# Patient Record
Sex: Male | Born: 1940 | Race: White | Hispanic: No | Marital: Married | Smoking: Former smoker
Health system: Southern US, Community
[De-identification: ages and names within clinical notes are randomized; demographics above are authoritative.]

## PROBLEM LIST (undated history)

## (undated) DIAGNOSIS — G8929 Other chronic pain: Secondary | ICD-10-CM

## (undated) DIAGNOSIS — R001 Bradycardia, unspecified: Secondary | ICD-10-CM

## (undated) DIAGNOSIS — M1990 Unspecified osteoarthritis, unspecified site: Secondary | ICD-10-CM

## (undated) DIAGNOSIS — I2510 Atherosclerotic heart disease of native coronary artery without angina pectoris: Secondary | ICD-10-CM

## (undated) DIAGNOSIS — I10 Essential (primary) hypertension: Secondary | ICD-10-CM

## (undated) DIAGNOSIS — C679 Malignant neoplasm of bladder, unspecified: Secondary | ICD-10-CM

## (undated) DIAGNOSIS — Z87442 Personal history of urinary calculi: Secondary | ICD-10-CM

## (undated) DIAGNOSIS — Z720 Tobacco use: Secondary | ICD-10-CM

## (undated) DIAGNOSIS — E785 Hyperlipidemia, unspecified: Secondary | ICD-10-CM

## (undated) DIAGNOSIS — R8290 Unspecified abnormal findings in urine: Secondary | ICD-10-CM

## (undated) DIAGNOSIS — F419 Anxiety disorder, unspecified: Secondary | ICD-10-CM

## (undated) DIAGNOSIS — F329 Major depressive disorder, single episode, unspecified: Secondary | ICD-10-CM

## (undated) DIAGNOSIS — M545 Low back pain, unspecified: Secondary | ICD-10-CM

## (undated) DIAGNOSIS — F32A Depression, unspecified: Secondary | ICD-10-CM

## (undated) DIAGNOSIS — I779 Disorder of arteries and arterioles, unspecified: Secondary | ICD-10-CM

## (undated) DIAGNOSIS — I639 Cerebral infarction, unspecified: Secondary | ICD-10-CM

## (undated) DIAGNOSIS — I480 Paroxysmal atrial fibrillation: Secondary | ICD-10-CM

## (undated) DIAGNOSIS — C329 Malignant neoplasm of larynx, unspecified: Secondary | ICD-10-CM

## (undated) DIAGNOSIS — I739 Peripheral vascular disease, unspecified: Secondary | ICD-10-CM

## (undated) DIAGNOSIS — N183 Chronic kidney disease, stage 3 unspecified: Secondary | ICD-10-CM

## (undated) DIAGNOSIS — I4510 Unspecified right bundle-branch block: Secondary | ICD-10-CM

## (undated) DIAGNOSIS — J398 Other specified diseases of upper respiratory tract: Secondary | ICD-10-CM

## (undated) DIAGNOSIS — Z930 Tracheostomy status: Secondary | ICD-10-CM

## (undated) DIAGNOSIS — K219 Gastro-esophageal reflux disease without esophagitis: Secondary | ICD-10-CM

## (undated) DIAGNOSIS — G459 Transient cerebral ischemic attack, unspecified: Secondary | ICD-10-CM

## (undated) DIAGNOSIS — E781 Pure hyperglyceridemia: Secondary | ICD-10-CM

## (undated) HISTORY — PX: CATARACT EXTRACTION, BILATERAL: SHX1313

## (undated) HISTORY — DX: Atherosclerotic heart disease of native coronary artery without angina pectoris: I25.10

## (undated) HISTORY — PX: CORONARY ARTERY BYPASS GRAFT: SHX141

## (undated) HISTORY — DX: Bradycardia, unspecified: R00.1

## (undated) HISTORY — DX: Gastro-esophageal reflux disease without esophagitis: K21.9

## (undated) HISTORY — DX: Tobacco use: Z72.0

## (undated) HISTORY — PX: CAROTID STENT: SHX1301

## (undated) HISTORY — DX: Malignant neoplasm of larynx, unspecified: C32.9

## (undated) HISTORY — DX: Disorder of arteries and arterioles, unspecified: I77.9

## (undated) HISTORY — DX: Peripheral vascular disease, unspecified: I73.9

## (undated) HISTORY — DX: Anxiety disorder, unspecified: F41.9

## (undated) HISTORY — DX: Essential (primary) hypertension: I10

## (undated) HISTORY — DX: Transient cerebral ischemic attack, unspecified: G45.9

## (undated) HISTORY — DX: Chronic kidney disease, stage 3 unspecified: N18.30

## (undated) HISTORY — DX: Cerebral infarction, unspecified: I63.9

## (undated) HISTORY — DX: Paroxysmal atrial fibrillation: I48.0

## (undated) HISTORY — PX: EYE SURGERY: SHX253

## (undated) HISTORY — PX: CORNEAL TRANSPLANT: SHX108

## (undated) HISTORY — DX: Unspecified right bundle-branch block: I45.10

---

## 1996-07-26 DIAGNOSIS — C329 Malignant neoplasm of larynx, unspecified: Secondary | ICD-10-CM

## 1996-07-26 HISTORY — PX: PARTIAL LARYNGECTOMY: SHX2175

## 1996-07-26 HISTORY — DX: Malignant neoplasm of larynx, unspecified: C32.9

## 2001-01-30 ENCOUNTER — Encounter: Payer: Self-pay | Admitting: Internal Medicine

## 2001-01-30 ENCOUNTER — Ambulatory Visit (HOSPITAL_COMMUNITY): Admission: RE | Admit: 2001-01-30 | Discharge: 2001-01-30 | Payer: Self-pay | Admitting: Internal Medicine

## 2001-02-14 ENCOUNTER — Ambulatory Visit (HOSPITAL_COMMUNITY): Admission: RE | Admit: 2001-02-14 | Discharge: 2001-02-14 | Payer: Self-pay | Admitting: General Surgery

## 2001-05-09 ENCOUNTER — Encounter: Payer: Self-pay | Admitting: Otolaryngology

## 2001-05-09 ENCOUNTER — Encounter: Admission: RE | Admit: 2001-05-09 | Discharge: 2001-05-09 | Payer: Self-pay | Admitting: Otolaryngology

## 2001-12-20 ENCOUNTER — Encounter: Payer: Self-pay | Admitting: Cardiology

## 2001-12-20 ENCOUNTER — Inpatient Hospital Stay (HOSPITAL_COMMUNITY): Admission: AD | Admit: 2001-12-20 | Discharge: 2001-12-27 | Payer: Self-pay | Admitting: Cardiology

## 2001-12-20 ENCOUNTER — Encounter: Payer: Self-pay | Admitting: Internal Medicine

## 2001-12-21 ENCOUNTER — Encounter: Payer: Self-pay | Admitting: Thoracic Surgery (Cardiothoracic Vascular Surgery)

## 2001-12-22 ENCOUNTER — Encounter: Payer: Self-pay | Admitting: Thoracic Surgery (Cardiothoracic Vascular Surgery)

## 2001-12-23 ENCOUNTER — Encounter: Payer: Self-pay | Admitting: Thoracic Surgery (Cardiothoracic Vascular Surgery)

## 2001-12-25 ENCOUNTER — Encounter: Payer: Self-pay | Admitting: Thoracic Surgery (Cardiothoracic Vascular Surgery)

## 2002-01-04 ENCOUNTER — Encounter: Payer: Self-pay | Admitting: *Deleted

## 2002-01-04 ENCOUNTER — Encounter: Payer: Self-pay | Admitting: Internal Medicine

## 2002-01-04 ENCOUNTER — Observation Stay (HOSPITAL_COMMUNITY): Admission: EM | Admit: 2002-01-04 | Discharge: 2002-01-05 | Payer: Self-pay | Admitting: *Deleted

## 2002-01-17 ENCOUNTER — Encounter
Admission: RE | Admit: 2002-01-17 | Discharge: 2002-01-17 | Payer: Self-pay | Admitting: Thoracic Surgery (Cardiothoracic Vascular Surgery)

## 2002-01-17 ENCOUNTER — Encounter: Payer: Self-pay | Admitting: Thoracic Surgery (Cardiothoracic Vascular Surgery)

## 2002-02-06 ENCOUNTER — Encounter (HOSPITAL_COMMUNITY): Admission: RE | Admit: 2002-02-06 | Discharge: 2002-03-08 | Payer: Self-pay | Admitting: Cardiology

## 2002-03-08 ENCOUNTER — Encounter (HOSPITAL_COMMUNITY): Admission: RE | Admit: 2002-03-08 | Discharge: 2002-04-07 | Payer: Self-pay | Admitting: Cardiology

## 2002-04-09 ENCOUNTER — Encounter (HOSPITAL_COMMUNITY): Admission: RE | Admit: 2002-04-09 | Discharge: 2002-05-09 | Payer: Self-pay | Admitting: Cardiology

## 2002-04-11 ENCOUNTER — Ambulatory Visit (HOSPITAL_COMMUNITY): Admission: RE | Admit: 2002-04-11 | Discharge: 2002-04-11 | Payer: Self-pay | Admitting: Otolaryngology

## 2002-04-11 ENCOUNTER — Encounter: Payer: Self-pay | Admitting: Otolaryngology

## 2002-05-25 ENCOUNTER — Ambulatory Visit (HOSPITAL_COMMUNITY): Admission: RE | Admit: 2002-05-25 | Discharge: 2002-05-25 | Payer: Self-pay | Admitting: Internal Medicine

## 2002-05-25 ENCOUNTER — Encounter: Payer: Self-pay | Admitting: Internal Medicine

## 2002-12-28 ENCOUNTER — Encounter (HOSPITAL_COMMUNITY): Admission: RE | Admit: 2002-12-28 | Discharge: 2003-01-27 | Payer: Self-pay | Admitting: *Deleted

## 2002-12-28 ENCOUNTER — Encounter: Payer: Self-pay | Admitting: *Deleted

## 2004-11-05 ENCOUNTER — Ambulatory Visit (HOSPITAL_COMMUNITY): Admission: RE | Admit: 2004-11-05 | Discharge: 2004-11-05 | Payer: Self-pay | Admitting: Internal Medicine

## 2005-04-29 ENCOUNTER — Ambulatory Visit (HOSPITAL_COMMUNITY): Admission: RE | Admit: 2005-04-29 | Discharge: 2005-04-29 | Payer: Self-pay | Admitting: Internal Medicine

## 2005-05-04 ENCOUNTER — Ambulatory Visit (HOSPITAL_COMMUNITY): Admission: RE | Admit: 2005-05-04 | Discharge: 2005-05-04 | Payer: Self-pay | Admitting: Internal Medicine

## 2005-05-13 ENCOUNTER — Encounter (INDEPENDENT_AMBULATORY_CARE_PROVIDER_SITE_OTHER): Payer: Self-pay | Admitting: *Deleted

## 2005-05-13 ENCOUNTER — Ambulatory Visit (HOSPITAL_COMMUNITY): Admission: RE | Admit: 2005-05-13 | Discharge: 2005-05-13 | Payer: Self-pay | Admitting: Internal Medicine

## 2005-05-27 ENCOUNTER — Ambulatory Visit (HOSPITAL_COMMUNITY): Admission: RE | Admit: 2005-05-27 | Discharge: 2005-05-27 | Payer: Self-pay | Admitting: Internal Medicine

## 2005-06-15 ENCOUNTER — Ambulatory Visit: Payer: Self-pay | Admitting: Internal Medicine

## 2005-06-18 ENCOUNTER — Ambulatory Visit: Payer: Self-pay | Admitting: Cardiology

## 2005-06-25 ENCOUNTER — Ambulatory Visit (HOSPITAL_COMMUNITY): Admission: RE | Admit: 2005-06-25 | Discharge: 2005-06-25 | Payer: Self-pay | Admitting: Otolaryngology

## 2005-07-04 ENCOUNTER — Emergency Department (HOSPITAL_COMMUNITY): Admission: EM | Admit: 2005-07-04 | Discharge: 2005-07-04 | Payer: Self-pay | Admitting: Emergency Medicine

## 2005-07-16 ENCOUNTER — Ambulatory Visit (HOSPITAL_COMMUNITY): Admission: RE | Admit: 2005-07-16 | Discharge: 2005-07-16 | Payer: Self-pay | Admitting: Otolaryngology

## 2005-07-16 ENCOUNTER — Encounter (INDEPENDENT_AMBULATORY_CARE_PROVIDER_SITE_OTHER): Payer: Self-pay | Admitting: *Deleted

## 2005-07-26 HISTORY — PX: LAPAROSCOPIC CHOLECYSTECTOMY: SUR755

## 2005-08-17 ENCOUNTER — Ambulatory Visit (HOSPITAL_COMMUNITY): Admission: RE | Admit: 2005-08-17 | Discharge: 2005-08-17 | Payer: Self-pay | Admitting: Internal Medicine

## 2006-01-17 ENCOUNTER — Ambulatory Visit (HOSPITAL_COMMUNITY): Admission: RE | Admit: 2006-01-17 | Discharge: 2006-01-17 | Payer: Self-pay | Admitting: Internal Medicine

## 2006-01-31 ENCOUNTER — Emergency Department (HOSPITAL_COMMUNITY): Admission: EM | Admit: 2006-01-31 | Discharge: 2006-01-31 | Payer: Self-pay | Admitting: Emergency Medicine

## 2006-01-31 ENCOUNTER — Encounter (INDEPENDENT_AMBULATORY_CARE_PROVIDER_SITE_OTHER): Payer: Self-pay | Admitting: *Deleted

## 2006-01-31 ENCOUNTER — Ambulatory Visit (HOSPITAL_COMMUNITY): Admission: RE | Admit: 2006-01-31 | Discharge: 2006-01-31 | Payer: Self-pay | Admitting: General Surgery

## 2006-05-24 ENCOUNTER — Ambulatory Visit: Payer: Self-pay | Admitting: Cardiology

## 2006-05-24 ENCOUNTER — Inpatient Hospital Stay (HOSPITAL_COMMUNITY): Admission: EM | Admit: 2006-05-24 | Discharge: 2006-05-27 | Payer: Self-pay | Admitting: Emergency Medicine

## 2006-05-25 ENCOUNTER — Ambulatory Visit: Payer: Self-pay | Admitting: Internal Medicine

## 2006-05-26 ENCOUNTER — Encounter: Payer: Self-pay | Admitting: Cardiology

## 2006-05-30 ENCOUNTER — Ambulatory Visit (HOSPITAL_COMMUNITY): Admission: RE | Admit: 2006-05-30 | Discharge: 2006-05-30 | Payer: Self-pay | Admitting: Cardiovascular Disease

## 2006-06-07 ENCOUNTER — Ambulatory Visit (HOSPITAL_COMMUNITY): Admission: RE | Admit: 2006-06-07 | Discharge: 2006-06-07 | Payer: Self-pay | Admitting: Internal Medicine

## 2006-06-09 ENCOUNTER — Ambulatory Visit: Payer: Self-pay | Admitting: Cardiology

## 2006-06-30 ENCOUNTER — Ambulatory Visit (HOSPITAL_COMMUNITY): Admission: RE | Admit: 2006-06-30 | Discharge: 2006-06-30 | Payer: Self-pay | Admitting: General Surgery

## 2006-07-04 ENCOUNTER — Ambulatory Visit (HOSPITAL_COMMUNITY): Admission: RE | Admit: 2006-07-04 | Discharge: 2006-07-04 | Payer: Self-pay | Admitting: Internal Medicine

## 2006-08-04 ENCOUNTER — Ambulatory Visit: Payer: Self-pay | Admitting: Gastroenterology

## 2006-12-08 ENCOUNTER — Ambulatory Visit (HOSPITAL_COMMUNITY): Admission: RE | Admit: 2006-12-08 | Discharge: 2006-12-08 | Payer: Self-pay | Admitting: Internal Medicine

## 2006-12-09 ENCOUNTER — Ambulatory Visit (HOSPITAL_COMMUNITY): Admission: RE | Admit: 2006-12-09 | Discharge: 2006-12-09 | Payer: Self-pay | Admitting: Family Medicine

## 2006-12-09 ENCOUNTER — Ambulatory Visit: Payer: Self-pay | Admitting: Cardiology

## 2006-12-15 ENCOUNTER — Ambulatory Visit (HOSPITAL_COMMUNITY): Admission: RE | Admit: 2006-12-15 | Discharge: 2006-12-15 | Payer: Self-pay | Admitting: Internal Medicine

## 2007-11-22 ENCOUNTER — Ambulatory Visit (HOSPITAL_COMMUNITY): Admission: RE | Admit: 2007-11-22 | Discharge: 2007-11-22 | Payer: Self-pay | Admitting: Internal Medicine

## 2008-03-04 ENCOUNTER — Inpatient Hospital Stay (HOSPITAL_COMMUNITY): Admission: AD | Admit: 2008-03-04 | Discharge: 2008-03-05 | Payer: Self-pay | Admitting: Internal Medicine

## 2008-03-04 ENCOUNTER — Ambulatory Visit: Payer: Self-pay | Admitting: Cardiology

## 2008-04-09 ENCOUNTER — Ambulatory Visit (HOSPITAL_COMMUNITY): Admission: RE | Admit: 2008-04-09 | Discharge: 2008-04-09 | Payer: Self-pay | Admitting: Internal Medicine

## 2008-11-21 ENCOUNTER — Ambulatory Visit: Payer: Self-pay | Admitting: Cardiology

## 2008-11-25 ENCOUNTER — Ambulatory Visit (HOSPITAL_COMMUNITY): Admission: RE | Admit: 2008-11-25 | Discharge: 2008-11-25 | Payer: Self-pay | Admitting: Cardiology

## 2009-02-25 ENCOUNTER — Ambulatory Visit: Payer: Self-pay | Admitting: Ophthalmology

## 2009-03-03 ENCOUNTER — Ambulatory Visit: Payer: Self-pay | Admitting: Ophthalmology

## 2009-05-23 ENCOUNTER — Encounter (INDEPENDENT_AMBULATORY_CARE_PROVIDER_SITE_OTHER): Payer: Self-pay | Admitting: *Deleted

## 2009-05-23 LAB — CONVERTED CEMR LAB
ALT: 17 units/L
AST: 15 units/L
Albumin: 4.1 g/dL
Alkaline Phosphatase: 113 units/L
BUN: 19 mg/dL
Bacteria, UA: NONE SEEN
CO2: 24 meq/L
Calcium: 9.2 mg/dL
Chloride: 105 meq/L
Cholesterol: 172 mg/dL
Creatinine, Ser: 1.14 mg/dL
Glucose, Bld: 98 mg/dL
HCT: 47.2 %
HDL: 33 mg/dL
Hemoglobin, Urine: NEGATIVE
Hemoglobin: 15.5 g/dL
LDL Cholesterol: 82 mg/dL
MCV: 97.7 fL
Nitrite: NEGATIVE
Platelets: 189 10*3/uL
Potassium: 4.8 meq/L
Protein, ur: NEGATIVE mg/dL
RBC / HPF: NONE SEEN
Sodium: 142 meq/L
Specific Gravity, Urine: 1.019
Total Protein: 7.1 g/dL
Triglycerides: 286 mg/dL
Urine Glucose: NEGATIVE mg/dL
WBC number, urine, microscopy: NONE SEEN /hpf
WBC, UA: NONE SEEN cells/hpf
WBC: 7.5 10*3/uL
pH: 6

## 2009-06-05 ENCOUNTER — Encounter (INDEPENDENT_AMBULATORY_CARE_PROVIDER_SITE_OTHER): Payer: Self-pay | Admitting: *Deleted

## 2009-12-14 ENCOUNTER — Encounter: Payer: Self-pay | Admitting: Cardiology

## 2009-12-14 DIAGNOSIS — C329 Malignant neoplasm of larynx, unspecified: Secondary | ICD-10-CM | POA: Insufficient documentation

## 2009-12-14 DIAGNOSIS — I6350 Cerebral infarction due to unspecified occlusion or stenosis of unspecified cerebral artery: Secondary | ICD-10-CM | POA: Insufficient documentation

## 2009-12-14 DIAGNOSIS — K219 Gastro-esophageal reflux disease without esophagitis: Secondary | ICD-10-CM | POA: Insufficient documentation

## 2009-12-14 DIAGNOSIS — E785 Hyperlipidemia, unspecified: Secondary | ICD-10-CM | POA: Insufficient documentation

## 2009-12-14 DIAGNOSIS — E782 Mixed hyperlipidemia: Secondary | ICD-10-CM | POA: Insufficient documentation

## 2009-12-14 DIAGNOSIS — N200 Calculus of kidney: Secondary | ICD-10-CM | POA: Insufficient documentation

## 2009-12-15 ENCOUNTER — Encounter (INDEPENDENT_AMBULATORY_CARE_PROVIDER_SITE_OTHER): Payer: Self-pay | Admitting: *Deleted

## 2009-12-15 ENCOUNTER — Ambulatory Visit: Payer: Self-pay | Admitting: Cardiology

## 2010-02-10 ENCOUNTER — Encounter: Payer: Self-pay | Admitting: Cardiology

## 2010-02-10 LAB — CONVERTED CEMR LAB
ALT: 21 units/L (ref 0–53)
AST: 19 units/L (ref 0–37)
Albumin: 4.2 g/dL (ref 3.5–5.2)
Alkaline Phosphatase: 116 units/L (ref 39–117)
BUN: 14 mg/dL (ref 6–23)
Basophils Absolute: 0 10*3/uL (ref 0.0–0.1)
Basophils Relative: 0 % (ref 0–1)
CO2: 28 meq/L (ref 19–32)
Calcium: 9.3 mg/dL (ref 8.4–10.5)
Chloride: 104 meq/L (ref 96–112)
Cholesterol: 178 mg/dL (ref 0–200)
Creatinine, Ser: 1.29 mg/dL (ref 0.40–1.50)
Eosinophils Absolute: 0.2 10*3/uL (ref 0.0–0.7)
Eosinophils Relative: 2 % (ref 0–5)
Glucose, Bld: 96 mg/dL (ref 70–99)
HCT: 46.6 % (ref 39.0–52.0)
HDL: 31 mg/dL — ABNORMAL LOW (ref >39)
Hemoglobin: 15.5 g/dL (ref 13.0–17.0)
LDL Cholesterol: 78 mg/dL (ref 0–99)
Lymphocytes Relative: 29 % (ref 12–46)
Lymphs Abs: 2.1 10*3/uL (ref 0.7–4.0)
MCHC: 33.3 g/dL (ref 30.0–36.0)
MCV: 97.1 fL (ref 78.0–100.0)
Monocytes Absolute: 0.5 10*3/uL (ref 0.1–1.0)
Monocytes Relative: 7 % (ref 3–12)
Neutro Abs: 4.5 10*3/uL (ref 1.7–7.7)
Neutrophils Relative %: 62 % (ref 43–77)
Platelets: 182 10*3/uL (ref 150–400)
Potassium: 4.9 meq/L (ref 3.5–5.3)
RBC: 4.8 M/uL (ref 4.22–5.81)
RDW: 13.7 % (ref 11.5–15.5)
Sodium: 140 meq/L (ref 135–145)
Total Bilirubin: 0.6 mg/dL (ref 0.3–1.2)
Total CHOL/HDL Ratio: 5.7
Total Protein: 7.3 g/dL (ref 6.0–8.3)
Triglycerides: 344 mg/dL — ABNORMAL HIGH (ref <150)
VLDL: 69 mg/dL — ABNORMAL HIGH (ref 0–40)
WBC: 7.3 10*3/uL (ref 4.0–10.5)

## 2010-04-22 ENCOUNTER — Encounter (INDEPENDENT_AMBULATORY_CARE_PROVIDER_SITE_OTHER): Payer: Self-pay | Admitting: *Deleted

## 2010-06-01 ENCOUNTER — Encounter (INDEPENDENT_AMBULATORY_CARE_PROVIDER_SITE_OTHER): Payer: Self-pay

## 2010-06-01 LAB — CONVERTED CEMR LAB
ALT: 20 units/L
AST: 19 units/L
Albumin: 4.1 g/dL
Alkaline Phosphatase: 93 units/L
BUN: 15 mg/dL
Bacteria, UA: NONE SEEN
CO2: 28 meq/L
Calcium: 8.9 mg/dL
Casts: NONE SEEN /lpf
Chloride: 105 meq/L
Cholesterol: 185 mg/dL
Creatinine, Ser: 1.11 mg/dL
Glucose, Bld: 98 mg/dL
HCT: 45.7 %
HDL: 31 mg/dL
Hemoglobin, Urine: NEGATIVE
Hemoglobin: 15.1 g/dL
LDL Cholesterol: 101 mg/dL
MCV: 97 fL
Nitrite: NEGATIVE
Platelets: 201 10*3/uL
Potassium: 5.1 meq/L
Protein, ur: NEGATIVE mg/dL
Sodium: 140 meq/L
Specific Gravity, Urine: 1.018
TSH: 2.601 microintl units/mL
Total Protein: 6.8 g/dL
Triglycerides: 265 mg/dL
Urine Glucose: NEGATIVE mg/dL
WBC: 7.5 10*3/uL
pH: 6.5

## 2010-07-30 ENCOUNTER — Telehealth (INDEPENDENT_AMBULATORY_CARE_PROVIDER_SITE_OTHER): Payer: Self-pay | Admitting: *Deleted

## 2010-08-27 NOTE — Miscellaneous (Signed)
Summary: TSH, Lipids, CMP and Urinalysis

## 2010-08-27 NOTE — Letter (Signed)
Summary: [place] Results Engineer, agricultural at [facility]  [address], [place] [address]   Phone: [phone]  Fax: [phone]      April 22, 2010 MRN: [id number]   Kenneth [name] [address] [place], [place]  [address]   Dear [name],  Your test ordered by Selena Batten has been reviewed by your physician (or physician assistant) and was found to be normal or stable. Your physician (or physician assistant) felt no changes were needed at this time.  ____ Echocardiogram  ____ Cardiac Stress Test  __x__ Lab Work  ____ Peripheral vascular study of arms, legs or neck  ____ CT scan or X-ray  ____ Lung or Breathing test  ____ Other:  No change in medical treatment at this time, per Dr. Dietrich Pates.  Enclosed is a copy of your labwork for your records.  Thank you, [name] Allyne Gee RN    [place] Bing, MD, Lenise Arena.C.Gaylord Shih, MD, F.A.C.C Lewayne Bunting, MD, F.A.C.C Nona Dell, MD, F.A.C.C Charlton Haws, MD, Lenise Arena.C.C

## 2010-08-27 NOTE — Letter (Signed)
Summary: [place] Future Lab Work Engineer, agricultural at [facility]  [address], [place] [address]   Phone: [phone]  Fax: [phone]     Dec 15, 2009 MRN: [id number]   Kenneth [name] [address] [place], [place]  [address]      YOUR LAB WORK IS DUE   __________July 22, 2011_______________________________  Please go to [facility], located across the street from [facility] on the second floor.  Hours are Monday - Friday 7am until 7:30pm         Saturday 8am until 12noon    _x_  DO NOT EAT OR DRINK AFTER MIDNIGHT EVENING PRIOR TO LABWORK  __ YOUR LABWORK IS NOT FASTING --YOU MAY EAT PRIOR TO LABWORK

## 2010-08-27 NOTE — Assessment & Plan Note (Signed)
Summary: 1 yr f/u per checkout on 11/21/08/tg   Visit Type:  Follow-up Primary Provider:  Dr. [name]   History of Present Illness: [name] returns to the office as scheduled for continued assessment and treatment of coronary artery disease and cardiovascular risk factors.  This nice gentleman underwent CABG surgery in 11/2001 and has done well since from a cardiac standpoint.  He reports chronic dyspnea on exertion that has been present for years.  He rests for a few minutes and then can proceed with activity.  A stress nuclear study in 02/2008 revealed decent exercise tolerance with a workload of 10 Mets, no stress-induced EKG abnormalities, normal left ventricular function and normal myocardial perfusion.  Arterial Doppler  Procedure date:  11/25/2008  Findings:      Normal ABIs Normal segmental pressures and pulse volume recordings No evidence for arterial occlusive disease of either lower extremity.   Current Medications (verified): 1)  Paxil 10 Mg Tabs (Paroxetine Hcl) .... Take 1 Tab Daily 2)  Metoprolol Tartrate 50 Mg Tabs (Metoprolol Tartrate) .... Take 1/2 Tab Two Times A Day 3)  Pravastatin Sodium 20 Mg Tabs (Pravastatin Sodium) .... Take 1 Tab Daily 4)  Plavix 75 Mg Tabs (Clopidogrel Bisulfate) .... Take 1 Tab Daily 5)  Famotidine 40 Mg Tabs (Famotidine) .... Take 1 Tab Two Times A Day 6)  Ranitidine Hcl 300 Mg Caps (Ranitidine Hcl) .... Take 1 Tab Daily 7)  Niaspan 500 Mg Cr-Tabs (Niacin (Antihyperlipidemic)) .... Take One Tablet By Mouth Daily Titrate To 1500mg  Daily  Allergies (verified): 1)  ! Penicillin 2)  ! Ativan 3)  ! Sulfa 4)  ! Erythromycin 5)  ! * Intravenous Contrast  Past History:  PMH, FH, and Social History reviewed and updated.  Review of Systems       See history of present illness.  Vital Signs:  Patient profile:   70 year old male Height:      66 inches Weight:      183 pounds BMI:     29.64 O2 Sat:      94 % on Room  air Pulse rate:   50 / minute BP sitting:   113 / 66  (right arm)  Vitals Entered By: Dreama Saa, CNA (Dec 15, 2009 2:54 PM)  O2 Flow:  Room air  Physical Exam  General:    Proportionate weight and height;well developed; no acute distress; weight-183, 1 pound more than 4/10 HEENT-hoarse voice Neck-No JVD; no carotid bruits: Lungs-No tachypnea, no rales; no rhonchi; no wheezes: Cardiovascular-normal PMI; normal S1 and S2; S4 present Abdomen-BS normal; soft and non-tender without masses or organomegaly:  Musculoskeletal-No deformities, no cyanosis or clubbing: Neurologic-Normal cranial nerves; symmetric strength and tone:  Skin-Warm, no significant lesions: Extremities-palpable distal pulses; trace edema:     Impression & Recommendations:  Problem # 1:  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  Repeat coronary angiography in 2007 demonstrated patency of the RIMA to the circumflex and LIMA to the LAD with normal LV function.  Treatment will continue to focus on optimal management of risk factors.  Problem # 2:  HYPERLIPIDEMIA (ICD-272.4) Patient started Niaspan per my instructions after his last visit, but discontinued that medication before a lipid profile was obtained.  Will once again titrate from [phone] mg per day and then seek a lipid profile in 2 months.  Problem # 3:  HYPERTENSION (ICD-401.1) Blood pressure control is excellent; current medications  will be continued.  Problem # 4:  Hx of CEREBROVASCULAR ACCIDENT (ICD-434.91) Kenneth [name] has a number of indications for treatment with clopidogrel, but none of them are entirely compelling.  Consideration can be given to substitution by aspirin.  I will reassess this nice gentleman in one year.  Other Orders: Future Orders: T-Comprehensive Metabolic Panel ([id number]) ... 02/13/2010 T-Lipid Profile [id number]) ... 02/13/2010 T-CBC w/Diff  ([id number]) ... 02/13/2010  Patient Instructions: 1)  Your physician recommends that you schedule a follow-up appointment in: 1 year 2)  Your physician recommends that you return for lab work in:2 months 3)  Your physician has recommended you make the following change in your medication: niaspan 500mg  daily titrating to 1500mg  daily Prescriptions: NIASPAN 500 MG CR-TABS (NIACIN (ANTIHYPERLIPIDEMIC)) take one tablet by mouth daily titrate to 1500mg  daily  #90 x 3   Entered by:   Teressa Lower RN   Authorized by:   Kathlen Brunswick, MD, [facility]   Signed by:   Teressa Lower RN on 12/15/2009   Method used:   Electronically to        [id number] (retail)       [address]       [place], [place]  [address]       Ph: [phone]       Fax: [phone]   RxID:   [id number]

## 2010-08-27 NOTE — Progress Notes (Signed)
  All Cardaic faxed to [facility] attn Dr.Enarson @ [phone] [facility]  July 30, 2010 1:55 PM

## 2010-08-27 NOTE — Miscellaneous (Signed)
Summary: cmp,cbc.lipids,ua 05/23/2009  Clinical Lists Changes  Observations: Added new observation of BACTERIA URN: none seen (05/23/2009 16:30) Added new observation of RBCS MICRO U: none seen (05/23/2009 16:30) Added new observation of WBCS MICRO U: none seen (05/23/2009 16:30) Added new observation of WBC UR: none seen (05/23/2009 16:30) Added new observation of NITRITE URN: neg (05/23/2009 16:30) Added new observation of PROTEIN UR: neg (05/23/2009 16:30) Added new observation of BLOOD UR: neg (05/23/2009 16:30) Added new observation of GLUCOSE UA: neg (05/23/2009 16:30) Added new observation of PH URINE: 6.0  (05/23/2009 16:30) Added new observation of SPEC GR URIN: 1.019  (05/23/2009 16:30) Added new observation of CALCIUM: 9.2 mg/dL (16/04/9603 54:09) Added new observation of ALBUMIN: 4.1 g/dL (81/19/1478 29:56) Added new observation of PROTEIN, TOT: 7.1 g/dL (21/30/8657 84:69) Added new observation of SGPT (ALT): 17 units/L (05/23/2009 16:30) Added new observation of SGOT (AST): 15 units/L (05/23/2009 16:30) Added new observation of ALK PHOS: 113 units/L (05/23/2009 16:30) Added new observation of BILI DIRECT: total bili  0.4 mg/dL (62/95/2841 32:44) Added new observation of CREATININE: 1.14 mg/dL (07/28/7251 66:44) Added new observation of BUN: 19 mg/dL (03/47/4259 56:38) Added new observation of BG RANDOM: 98 mg/dL (75/64/3329 51:88) Added new observation of CO2 PLSM/SER: 24 meq/L (05/23/2009 16:30) Added new observation of CL SERUM: 105 meq/L (05/23/2009 16:30) Added new observation of K SERUM: 4.8 meq/L (05/23/2009 16:30) Added new observation of NA: 142 meq/L (05/23/2009 16:30) Added new observation of LDL: 82 mg/dL (41/66/0630 16:01) Added new observation of HDL: 33 mg/dL (09/32/3557 32:20) Added new observation of TRIGLYC TOT: 286 mg/dL (25/42/7062 37:62) Added new observation of CHOLESTEROL: 172 mg/dL (83/15/1761 60:73) Added new observation of PLATELETK/UL: 189 K/uL  (05/23/2009 16:30) Added new observation of MCV: 97.7 fL (05/23/2009 16:30) Added new observation of HCT: 47.2 % (05/23/2009 16:30) Added new observation of HGB: 15.5 g/dL (71/12/2692 85:46) Added new observation of WBC COUNT: 7.5 10*3/microliter (05/23/2009 16:30)

## 2010-12-08 NOTE — Procedures (Signed)
NAME:  Kenneth [name], Kenneth [name] NO.:  [id number]   MEDICAL RECORD NO.:  [id number]          PATIENT TYPE:  OUT   LOCATION:  RAD                           FACILITY:  [facility]   PHYSICIAN:  Gerrit Friends. Dietrich Pates, MD, FACCDATE OF BIRTH:  Dec 10, 1940   DATE OF PROCEDURE:  12/09/2006  DATE OF DISCHARGE:                                ECHOCARDIOGRAM   REFERRING PHYSICIAN:  Kingsley Callander. Ouida Sills, M.D.   DATA:  [name] with TIA.   1. Technically adequate echocardiographic study.  2. Normal left and right atrial size; right and jugular size at the      upper limit of normal with normal systolic function; no RVH.  3. Normal aortic valve.  4. Normal proximal ascending aorta except for mild calcification of      the wall and annulus.  5. Very mild mitral valve thickening with mild annular calcification.  6. Normal tricuspid valve; physiologic regurgitation; normal estimated      RV systolic pressure.  7. Normal proximal pulmonary artery; pulmonic valve not well seen, but      is grossly normal.  8. Normal left ventricular size with borderline hypertrophy; normal      regional and global function.   Per      Gerrit Friends. Dietrich Pates, MD, [facility]  Electronically Signed     RMR/MEDQ  D:  12/09/2006  T:  12/10/2006  Job:  [id number]

## 2010-12-08 NOTE — H&P (Signed)
NAME:  Kenneth [name], Kenneth [name] NO.:  [id number]   MEDICAL RECORD NO.:  [id number]          PATIENT TYPE:  INP   LOCATION:  A335                          FACILITY:  [facility]   PHYSICIAN:  Kingsley Callander. Ouida Sills, MD       DATE OF BIRTH:  1941/07/26   DATE OF ADMISSION:  03/04/2008  DATE OF DISCHARGE:  LH                              HISTORY & PHYSICAL   CHIEF COMPLAINT:  Chest pain.   HISTORY OF PRESENT ILLNESS:  This patient is a 70 year old white male  with a history of coronary artery disease who presented to my office  complaining of recurring episodes of substernal chest pain over the past  2 weeks.  He states the pains have lasted for 15 minutes to over an hour  and have radiated from his substernal area up into his jaw and teeth.  He has had nausea, but no vomiting.  He denies diaphoresis or increased  shortness of breath.  Pains have not been related to exertion, position,  or meals.  He has not used nitroglycerin.  He had a three-vessel bypass  in 2003.  He had a cardiac cath in 2007.  Medical therapy was advised at  that point.  He had a V/Q scan during that hospitalization in the fall  of 2007, which was normal.  He does not smoke.  He has treated  hyperlipidemia.  He does not have diabetes or hypertension.   PAST MEDICAL HISTORY:  1. Coronary artery disease, status post CABG.  2. Stroke, treated with aspirin and Plavix.  3. Hyperlipidemia.  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  5. Anxiety.  6. Gastroesophageal reflux disease.  7. Kidney stone.  8. Status post laparoscopic cholecystectomy.   MEDICATIONS:  1. Plavix 75 mg daily.  2. Aspirin 81 mg, he takes 2 per day.  3. Metoprolol 25 mg b.i.d.  4. Lovastatin 20 mg daily.  5. Lopid 600 mg b.i.d.  6. Paxil 10 mg daily.  7. Prilosec 20 mg daily.   ALLERGIES:  PENICILLIN and ATIVAN.   SOCIAL HISTORY:  He is a former smoker.  He does not smoke now.  He does  not drink  alcohol.   FAMILY HISTORY:  His father died of an MI at 17 and had hypertension.  His mother had Alzheimer's disease, a brother died of heart disease, and  a brother has had prostate cancer.   REVIEW OF SYSTEMS:  He has had a chronic dyspnea on exertion.  He has  not had increased cough, fever, change in bowel habits, and difficulty  voiding.  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  He had a  colonoscopy in 2002.  He had an EGD in 2007, which revealed no ulcer.  Chronic gastritis changes were present.   PHYSICAL EXAMINATION:  VITAL SIGNS:  Blood pressure 96/70, pulse 60,  weight 177.  GENERAL:  Alert and in no distress.  HEENT:  He has a chronic voice change.  No scleral icterus.  Pharynx is  dry.  NECK: No  JVD or thyromegaly.  LUNGS: Clear.  Well healed sternotomy scar.  HEART:  Regular with no murmurs.  ABDOMEN:  Nontender.  No hepatosplenomegaly.  EXTREMITIES:  No cyanosis, clubbing, or edema.  Pulses intact.  NEURO:  At baseline.  SKIN:  Warm and dry.   LABORATORY DATA:  His EKG reveals normal sinus rhythm at 62 beats per  minute with early transition, possible left atrial enlargement and  nonspecific anterolateral T-wave changes.   IMPRESSION:  1. Unstable angina.  He has had an episode of chest pain earlier this      morning.  He is being hospitalized for additional evaluation.  We      will obtain cardiac enzymes and a cardiology consultation.  He did      have an 80% stenosis of an obtuse marginal on his cardiac cath in      2007.  2. History of laryngeal cancer.  He has had no evidence of recurrence.  3. History of stroke (right parietal/right frontal, May 2008).  4. Gastroesophageal reflux disease.  5. Hyperlipidemia.  6. Anxiety.  7. Lung scarring.      Kingsley Callander. Ouida Sills, MD  Electronically Signed     ROF/MEDQ  D:  03/04/2008  T:  03/05/2008  Job:  [id number]

## 2010-12-08 NOTE — Letter (Signed)
November 21, 2008    Kingsley Callander. Ouida Sills, MD  [address]  [place], [place] [address]   RE:  Kenneth [name], Kenneth [name]  MRN:  [id number]  /  DOB:  1941-01-15   Dear Kenneth [name],   Kenneth [name] returns to the office today for continued assessment and  treatment of coronary artery disease.  I had evaluated him in hospital  approximately 8 months ago, but not in the office for the past 3 years.  He has done well.  She had negative stress test when he was admitted in  August 2009.  He has had no subsequent chest discomfort.  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  Recent lipid  profile was fairly good, but triglycerides remain high and HDL remains  low.  Blood pressure control has been good as far as he knows.   CURRENT MEDICATIONS:  Paxil 10 mg daily, metoprolol 25 mg b.i.d.,  lovastatin 40 mg daily, clopidogrel 75 mg daily, cimetidine 75 mg daily.  He previously took gemfibrozil, but developed a skin rash that was  thought secondary to that medication.   PHYSICAL EXAMINATION:  GENERAL:  Pleasant gentleman in no acute  distress.  VITAL SIGNS:  The weight is 182, two pounds less than in 2007.  Blood  pressure 100/65, heart rate 50 and regular, respirations 12 and  unlabored.  NECK:  No jugular venous distention; normal carotid upstrokes without  bruits.  LUNGS:  Clear.  CARDIAC:  Normal first and second heart sounds; fourth heart sound  present.  ABDOMEN:  Soft and nontender; no organomegaly.  EXTREMITIES:  Distal pulses 1+; wave forms are biphasic on the right,  but monophasic on the left; left dorsalis pedis is not obtainable.   IMPRESSION:  Kenneth [name] is doing well from a cardiac standpoint with  no progression of disease at catheterization in 2007 and no ischemia on  stress testing less than a year ago.  He has preserved left ventricular  systolic function.  Hypertension is well controlled.  Control of  dyslipidemia somewhat suboptimal.  Niaspan will be added to  his medical  regime and titrated to 1500 mg daily with a repeat lipid profile  thereafter.  He appears to have peripheral vascular disease and  complains that his legs give out easily when he exerts myself.  ABIs and  arterial ultrasound of the lower extremities will be obtained.  I will  plan to see this nice gentleman again in 1 year.    Sincerely,      Gerrit Friends. Dietrich Pates, MD, [facility]  Electronically Signed    RMR/MedQ  DD: 11/21/2008  DT: 11/22/2008  Job #: [id number]

## 2010-12-08 NOTE — Discharge Summary (Signed)
NAME:  Kenneth [name], Kenneth [name] NO.:  [id number]   MEDICAL RECORD NO.:  [id number]           PATIENT TYPE:  INP   LOCATION:  A335                          FACILITY:  [facility]   PHYSICIAN:  Kingsley Callander. Ouida Sills, MD       DATE OF BIRTH:  [birth year]   DATE OF ADMISSION:  03/04/2008  DATE OF DISCHARGE:  08/11/2009LH                               DISCHARGE SUMMARY   DISCHARGE DIAGNOSES:  1. Chest pain.  2. Urticaria.  3. Coronary artery disease.  4. History of stroke.  5. History of laryngeal cancer.  6. Gastroesophageal reflux disease.  7. Hyperlipidemia.  8. Anxiety.  9. History of lung scarring.   DISCHARGE MEDICATIONS:  1. Aspirin 81 mg daily.  2. Plavix 75 mg daily.  3. Metoprolol 25 mg b.i.d.  4. Lovastatin 20 mg q.h.s.  5. Omeprazole 20 mg daily.  6. Lopid 600 mg b.i.d.  7. Claritin 10 mg daily.  8. Sublingual nitroglycerin 0.4 mg every 5 minutes x3 p.r.n.   PROCEDURES:  Myoview stress test.   HOSPITAL COURSE:  This patient is a 70 year old white male with a  history of coronary artery disease and bypass surgery who presented with  recurring bouts of chest pain over the past 2 weeks.  His EKG showed no  definite ischemic changes.  His cardiac enzymes were normal.  He was in  normal sinus rhythm on telemetry.  He was seen in Cardiology  consultation by Dr. Dietrich Pates.  He underwent a Myoview stress test which  revealed no evidence of ischemia.  Recommendation from Cardiology was to  utilize sublingual nitroglycerin and return for outpatient followup.  He  will continue his current cardiovascular medications.   He had experienced recent episodes of urticaria and possibly angioedema.  He will be treated with Claritin and p.r.n. Atarax.  He will be seen in  followup in my office in 2 weeks.      Kingsley Callander. Ouida Sills, MD  Electronically Signed     ROF/MEDQ  D:  03/05/2008  T:  03/06/2008  Job:  [id number]

## 2010-12-08 NOTE — Consult Note (Signed)
NAME:  Kenneth [name], Kenneth [name] NO.:  [id number]   MEDICAL RECORD NO.:  [id number]          PATIENT TYPE:  INP   LOCATION:  A335                          FACILITY:  [facility]   PHYSICIAN:  Kenneth Friends. Dietrich Pates, MD, FACCDATE OF BIRTH:  May 04, 1941   DATE OF CONSULTATION:  03/04/2008  DATE OF DISCHARGE:  03/05/2008                                 CONSULTATION   REFERRING PHYSICIAN:  Kingsley Callander. Ouida Sills, MD   PRIMARY CARDIOLOGIST:  Previously Dr. Daleen Squibb.   HISTORY OF PRESENT ILLNESS:  [name] with a known  coronary artery disease admitted to hospital with recurrent chest  discomfort.  Kenneth [name] underwent CABG surgery in 2003.  In 2007, he  presented with similar symptoms and underwent repeat cardiac  catheterization demonstrating patent grafts.  He has done well since,  but developed episodic substernal chest pressure over the past few  weeks.  These are of moderate intensity and persist for a number of  minutes to up to an hour.  There is radiation to the left jaw and teeth.  He has associated nausea, but no emesis.  He has had no diaphoresis nor  dyspnea with these spells.  There is no clear relationship to exertion.  He has not tried nitroglycerin.  He was seen in his primary care  physician's office and admitted for further evaluation.   PAST MEDICAL HISTORY:  Otherwise notable for,  1. Hypertension.  2. Hyperlipidemia.  3. GERD.  4. Nephrolithiasis.  5. Vertigo.  6. He underwent uncomplicated cholecystectomy in 2007.  7. He had laryngeal carcinoma approximately 11 years that has not      recurred following resection.   ALLERGIES:  PENICILLIN, ERYTHROMYCIN, and SULFA DRUGS are reported.   MEDICATIONS:  1. Clopidogrel 75 mg daily.  2. Aspirin 81 mg b.i.d.  3. Metoprolol 25 mg b.i.d.  4. Lovastatin 20 mg daily.  5. Lopid 500 mg b.i.d.  6. Paxil 10 mg daily.  7. Prilosec 20 mg daily.   SOCIAL HISTORY:  Previous cigarette use with approximately 40  pack-year  consumption.  No excessive use of alcohol.   FAMILY HISTORY:  Father suffered a fatal myocardial infarction at  advanced age; mother had Alzheimer disease; brother had cardiac disease,  and there is a family history of hypertension.   REVIEW OF SYSTEMS:  Occasional reflux symptoms; chronically hoarse voice  following partial laryngectomy; visual impairment requiring corrective  lenses; hearing impairment on the left requiring a hearing aid; upper  and lower dentures; occasional depression; regular diet at home.  All  other systems reviewed and are negative.   PHYSICAL EXAMINATION:  GENERAL:  Pleasant gentleman in no acute  distress.  VITAL SIGNS:  Temperature is 98.1, heart rate 60 and regular, blood  pressure 125/65, and respirations 18.  HEENT:  Anicteric sclerae; normal lids and conjunctivae; hoarse voice;  normal oral mucosa; hearing aid on the left ear.  NECK:  No jugular venous distention; normal carotid upstrokes without  bruits.  ENDOCRINE:  No thyromegaly.  HEMATOPOIETIC:  No adenopathy.  LUNGS:  Clear; resonant to percussion.  CARDIAC:  Normal first  and second heart sounds; fourth heart sound  present.  ABDOMEN:  Soft and nontender; no organomegaly.  EXTREMITIES:  No edema; 1+ distal pulses.  NEUROLOGIC:  Symmetric strength and tone; normal cranial nerves.   EKG:  Normal sinus rhythm; within normal limits.   Initial cardiac markers are normal.   Chest x-ray:  Increased markings in the mid lung zones bilaterally that  appears unchanged from November 2007 and it has been previously  investigated with the CT scan that revealed scarring and atelectasis.  Chest x-ray is unremarkable.  CBC and chemistry profile are normal  except for a mildly elevated alkaline phosphatase.   IMPRESSION:  Kenneth [name] presents with impressive symptoms, but  cardiac catheterization has been negative in the past for progression of  disease with similar episodes.  We will plan to  proceed with a nuclear  stress test in the morning if testing continues to be benign and  symptoms do not recur.  If negative, empiric treatment with  nitroglycerin will likely be the approach to take in the immediate  future.  We greatly appreciate the request for consultation and will be  happy to continue to follow this nice gentleman with you.      Kenneth Friends. Dietrich Pates, MD, [facility]  Electronically Signed     RMR/MEDQ  D:  03/05/2008  T:  03/06/2008  Job:  [id number]

## 2010-12-10 ENCOUNTER — Other Ambulatory Visit (HOSPITAL_COMMUNITY): Payer: Self-pay | Admitting: Internal Medicine

## 2010-12-10 ENCOUNTER — Ambulatory Visit (HOSPITAL_COMMUNITY)
Admission: RE | Admit: 2010-12-10 | Discharge: 2010-12-10 | Disposition: A | Discharge status: Home / Self Care | Payer: MEDICARE | Source: Ambulatory Visit | Attending: Internal Medicine | Admitting: Internal Medicine

## 2010-12-10 DIAGNOSIS — R52 Pain, unspecified: Secondary | ICD-10-CM

## 2010-12-10 DIAGNOSIS — M25519 Pain in unspecified shoulder: Secondary | ICD-10-CM | POA: Insufficient documentation

## 2010-12-10 DIAGNOSIS — S46909A Unspecified injury of unspecified muscle, fascia and tendon at shoulder and upper arm level, unspecified arm, initial encounter: Secondary | ICD-10-CM | POA: Insufficient documentation

## 2010-12-10 DIAGNOSIS — W19XXXA Unspecified fall, initial encounter: Secondary | ICD-10-CM

## 2010-12-10 DIAGNOSIS — S4980XA Other specified injuries of shoulder and upper arm, unspecified arm, initial encounter: Secondary | ICD-10-CM | POA: Insufficient documentation

## 2010-12-10 DIAGNOSIS — X58XXXA Exposure to other specified factors, initial encounter: Secondary | ICD-10-CM | POA: Insufficient documentation

## 2010-12-11 NOTE — Op Note (Signed)
[place]  Patient:    Kenneth [name], Kenneth [name] Visit Number: [id number] MRN: [id number]          Service Type: MED Location: 2000 2030 01 Attending Physician:  Charlett Lango Dictated by:   Salvatore Decent. Dorris Fetch, M.D. Proc. Date: 12/21/01 Admit Date:  12/20/2001   CC:         Thomas C. Daleen Squibb, M.D. [facility]  [name], M.D.  Veneda Melter, M.D. [facility]   Operative Report  PREOPERATIVE DIAGNOSIS:  Left main disease.  POSTOPERATIVE DIAGNOSIS:  Left main disease.  PROCEDURES: 1. Median sternotomy. 2. Extracorporeal circulation. 3. Coronary artery bypass grafting x3 (sequential left internal mammary artery    to first diagonal and left anterior descending coronary artery, right    internal mammary artery to obtuse marginal 1 via transverse sinus).  SURGEON:  Salvatore Decent. Dorris Fetch, M.D.  ASSISTANT:  Mikey Bussing, M.D.  ANESTHESIA:  General.  FINDINGS:  Good-quality targets, good-quality conduits.  Preserved left ventricular function.  CLINICAL NOTE:  Kenneth [name] is a 70 year old gentleman who presented with class III progressive angina.  He had an exercise tolerance test which was early positive with significant ST depressions anterolaterally.  He underwent cardiac catheterization on Dec 21, 2001, which showed a tight distal left main stenosis with involvement of the ostia of the LAD and circumflex.  He had a bifid LAD system with a larger diagonal branch, which had a 50% stenosis, and there was also a 50% stenosis of the smaller LAD septal branch.  The patient was referred for coronary artery bypass graft and given the severe nature of his left main disease, he was advised to undergo surgery on an urgent basis, as there was operating time available.  The indications, risks, benefits, and alternative procedures were discussed in detail with the patient and his family.  He understood and accepted the risks and agreed to  proceed.  DESCRIPTION OF PROCEDURE:  Kenneth [name] was brought from the catheterization lab holding area to the operating room on Dec 21, 2001.  Lines were placed to monitor arterial, central venous, and pulmonary arterial pressure.  EKG leads were placed for continuous telemetry.  The patient was taken to the operating room, anesthetized, and intubated.  A Foley catheter was placed.  Intravenous antibiotics were administered.  The chest, abdomen, and legs were prepped and draped in the usual fashion.  A median sternotomy was performed, and the left internal mammary artery was harvested using standard technique.  Heparin 5000 units was given prior to dividing the distal end of the mammary artery.  There was good flow through the cut end of the vessel.  The mammary artery was placed in a papaverine-soaked sponge and placed into the left pleural space.  Next the right internal mammary artery was harvested, again using standard technique, with the larger branches being doubly clipped.  The remainder of the full heparin dose was given prior to dividing the distal end of the right mammary artery.  Again there was excellent flow through the distal end of the right mammary artery as well.  After confirming that the ACT was adequate, the pericardium was opened.  The ascending aorta was inspected and palpated.  There was no palpable atherosclerotic disease.  The aorta was cannulated via concentric 2-0 Ethibond pledgeted pursestring sutures.  A dual-stage venous cannula was placed via pursestring suture in the right atrial appendage.  Cardiopulmonary bypass was instituted, and the patient was cooled to 32 degrees Celsius.  The  coronary arteries were inspected and the anastomotic sites were chosen.  The conduits were inspected and cut to length.  The diagonal and LAD were set up well for sequential mammary grafting given that the diagonal was the dominant vessel for the anterior portion of the  heart.  The right mammary reached easily via the transverse sinus to the high anterolateral obtuse marginal I.  There was no significant disease in the right coronary.  A foam pad was placed in the pericardium to protect the left phrenic nerve and insulate the heart.  A temperature probe was placed in the myocardial septum.  A cardioplegia cannula was placed in the ascending aorta.  The aorta was crossclamped.  The left ventricle was emptied via the aortic root vent.  Cardiac arrest was then achieved with a combination of cold antegrade blood cardioplegia and topical iced saline.  After achieving a complete diastolic arrest and a myocardial septal temperature of 11 degrees Celsius, the following distal anastomoses were performed:  First an incision was made in the right side of the pericardium to allow passage of the right mammary artery.  It was brought through the transverse sinus, being careful to avoid twisting of the pedicle.  The distal end of the right mammary was spatulated, and it was anastomosed end-to-side to the first obtuse marginal, which was a 2 mm, good-quality target.  The right mammary was a 2 mm, good-quality conduit.  The anastomosis was performed with a running 8-0 Prolene suture.  At the completion of the mammary to OM anastomosis, the bulldog clamp was briefly removed.  A good flash of flow was seen distally in the vessel.  There was good hemostasis at the anastomosis.  The bulldog clamp was replaced and the mammary pedicle was tacked to the epicardial surface of the heart with 6-0 Prolene sutures.  Additional cardioplegia then was administered down the aortic root.  Next the left internal mammary artery was brought through a window in the left side of the pericardium.  A longitudinal arteriotomy was made in the LAD at the site where it crossed over the large anterior diagonal branch of the LAD.  This was a 2 mm good-quality target.  The right mammary was a 2  mm good-quality conduit.  A side-to-side anastomosis was performed with a running 8-0 Prolene suture.  At the completion of this anastomosis, the bulldog clamp again was briefly removed.  There was good hemostasis at the anastomosis.  The bulldog clamp was replaced.  The distal end of the mammary artery was cut to length and spatulated, and it was anastomosed end-to-side to the LAD, which was a 1 mm small but good-quality vessel.  This anastomosis also was performed with a running 8-0 Prolene suture.  Rewarming was begun.  When the mammary to LAD anastomosis was complete, the bulldog clamp was removed from the mammary artery.  There was good hemostasis at both anastomoses.  Septal rewarming was noted.  The bulldog clamp was also removed from the right mammary artery.  The aortic crossclamp was removed.  The total crossclamp time was 50 minutes.  The patient spontaneously resumed sinus rhythm and did not require defibrillation.  The distal anastomoses were inspected for hemostasis.  Epicardial pacing wires were placed on the right ventricle and right atrium.  The cardioplegia cannula was removed from the ascending aorta.  When the patient had been rewarmed to a core temperature of 37 degrees Celsius, he was weaned from cardiopulmonary bypass.  He was in sinus rhythm  and on no inotropic support at the time of separation from bypass.  Total bypass time was 84 minutes.  The initial cardiac index was 2 L/min. per sq. m.  The patient remained hemodynamically stable throughout the postbypass period.  A test dose of protamine was administered.  It was well-tolerated.  The atrial and aortic cannulae were removed.  The remainder of the protamine was administered without incident.  The chest was irrigated with 1 L of warm normal saline containing 1 g of vancomycin.  The pericardium was reapproximated over the heart with interrupted 3-0 silk sutures.  The mediastinal fat was reapproximated to cover  the ascending aorta.  Bilateral pleural and two mediastinal chest tubes were placed through separate subcostal incisions.  The sternum was closed with heavy-gauge interrupted stainless steel wires, the pectoralis fascia was closed with a running #1 Vicryl suture, subcutaneous tissue was closed with running 2-0 Vicryl suture, and skin was closed with 3-0 Vicryl subcuticular suture.  All sponge, needle, and instrument counts were correct at the end of the procedure.  There were no intraoperative complications.  The patient was taken from the operating room to the surgical intensive care unit in critical but stable condition. Dictated by:   Salvatore Decent Dorris Fetch, M.D. Attending Physician:  Charlett Lango DD:  12/21/01 TD:  12/23/01 Job: [id number]

## 2010-12-11 NOTE — Procedures (Signed)
NAME:  Kenneth [name], Kenneth [name] NO.:  [id number]   MEDICAL RECORD NO.:  [id number]          PATIENT TYPE:  OUT   LOCATION:  RESP                          FACILITY:  [facility]   PHYSICIAN:  Edward L. Juanetta Gosling, M.D.DATE OF BIRTH:  Jan 11, 1941   DATE OF PROCEDURE:  DATE OF DISCHARGE:                              PULMONARY FUNCTION TEST   1.  Spirometry shows a mild ventilatory defect with evidence of airflow      obstruction.  2.  Lung volumes show very mild reduction in total lung capacity which may      indicate a separate restrictive change.  3.  DLCO is normal.  4.  Arterial blood gases are normal.  5.  There is no significant bronchodilator effect.   IMPRESSION:  This PFT is consistent with a diagnosis of chronic obstructive  pulmonary disease or asthma.      Edward L. Juanetta Gosling, M.D.  Electronically Signed     ELH/MEDQ  D:  05/28/2005  T:  05/28/2005  Job:  [id number]

## 2010-12-11 NOTE — Discharge Summary (Signed)
NAME:  Kenneth [name], KOHAN NO.:  [id number]   MEDICAL RECORD NO.:  [id number]          PATIENT TYPE:  INP   LOCATION:  3705                         FACILITY:  [facility]   PHYSICIAN:  Veverly Fells. Excell Seltzer, MD  DATE OF BIRTH:  1941-03-29   DATE OF ADMISSION:  05/25/2006  DATE OF DISCHARGE:  05/27/2006                                 DISCHARGE SUMMARY   PROCEDURES:  1. Cardiac catheterization.  2. Coronary arteriogram.  3. Left ventriculogram.  4. LIMA arteriogram.  5. RIMA arteriogram.  6. VQ scan.   PRIMARY DIAGNOSIS:  Chest pain, medical therapy for distal coronary artery  disease.   SECONDARY DIAGNOSES:  1. Hypertension.  2. Hyperlipidemia.  3. History of laryngeal carcinoma with surgery and radiation.  4. Status post a cholecystectomy.  5. Gastroesophageal reflux disease.  6. Nephrolithiasis.  7. History of inner ear problems.  8. ALLERGY OR INTOLERANCE TO PENICILLIN, ERYTHROMYCIN AND SULFA DRUGS.  9. Family history of coronary artery disease in his father and siblings.  10.Acute renal insufficiency post cath.  11.Stable bilateral nodular densities in the lungs, unchanged from the      previous year and likely scarring, but repeat chest x-ray in 6 months      to a year is recommended.   TIME AT DISCHARGE:  Thirty-eight minutes.   HOSPITAL COURSE:  Kenneth [name] is a 70 year old male with known coronary  artery disease.  He was evaluated at [facility] for chest pain by Dr.  Dietrich Pates.  Dr. Dietrich Pates felt that further evaluation and possible cardiac  catheterization were indicated and he was transferred to [facility]  for this.   His cardiac catheterization showed a totaled LAD and a 90% left main.  There  was an 80% stenosis to the OM.  The LIMA to the LAD was patent, but there  was a small kink where it was tethered to the chest wall, but this did not  appear to be flow limiting.  The RIMA to the OM was patent.  Dr. [name]  evaluated the films and  felt that there was a possible area of ischemia in  the small OM2 and the small diagonal.  A VQ scan was recommended.   The VQ scan was normal.  A lipid profile was performed, which showed a total  cholesterol of 147, triglycerides 159, HDL 30, LDL 85.  A TSH has been drawn  and is pending at the time of dictation.   The day after the heart catheterization, his creatinine increased to 2.2.  He was held overnight and hydrated.  The next day his BUN was 15 with a  creatinine 1.3.  He is not on any diuretic therapy and he is encouraged to  continue oral hydration.   Mr. Hommes pain has resolved.  He is to continue on his previous  therapies for hyperlipidemia.  His blood pressure was slightly elevated  here, occasionally going up into the 140s, but he states that he has well  controlled blood pressure at home.  Pending evaluation by Dr. Excell Seltzer, he is  tentatively stable for discharge on  May 27, 2006 with outpatient  followup arranged.   DISCHARGE INSTRUCTIONS:  1. He is to stick to a low-fat diet.  2. He is to call our office for problems with the cath site.  3. He is to follow up with Dr. Dietrich Pates on November 15 at 11:30 and with      Dr. Ouida Sills as needed.   DISCHARGE MEDICATIONS:  1. Coated aspirin 81 mg a day.  2. Metoprolol 25 mg one-half tab b.i.d.  3. Protonix 40 mg a day.  4. Lovastatin 40 mg a day.  5. TriCor 145 mg a day.  6. Nitroglycerin sublingual p.r.n.      Theodore Demark, PA-C      Veverly Fells. Excell Seltzer, MD  Electronically Signed    RB/MEDQ  D:  05/27/2006  T:  05/28/2006  Job:  [id number]   cc:   Kingsley Callander. Ouida Sills, MD

## 2010-12-11 NOTE — H&P (Signed)
[place]. [facility]  Patient:    Kenneth [name], Kenneth [name] Visit Number: [id number] MRN: [id number]          Service Type: Attending:  Jesse Sans. Wall, M.D. [facility] Dictated by:   Jacolyn Reedy, P.A.C. Adm. Date:  12/20/01   CC:         Kingsley Callander. Nicki Reaper, M.D.   History and Physical  DATE OF BIRTH:  24-Sep-2040  CHIEF COMPLAINT:  Chest pain.  PRIMARY MEDICAL DOCTOR:  Kingsley Callander. Nicki Reaper, M.D.  HISTORY OF PRESENT ILLNESS:  This is a 70 year old, married, white, male patient referred for evaluation of chest pain.  He had a positive stress test, but Cardiolite images are pending.  The patient exercised 4 minutes 45 seconds of the Bruce protocol, obtaining a heart rate of 130.  The target heart rate was 134.  He developed severe chest pain and 2-3 mm ST depression inferolaterally.  He has complained of a two to three-month history of exertional chest pressure radiating to his back associated with shortness of breath that eases with rest.  This has progressively worsened with increase in pain and with less activity.  Now can walk up a flight of stairs or lift anything and develop chest pressure.  ALLERGIES:  PENICILLIN causes itching.  ATIVAN causes confusion.  CURRENT MEDICATIONS: 1. Prilosec q.d., question dosage. 2. Univasc 7.5 mg q.d.  PAST MEDICAL HISTORY:  Significant for: 1. Laryngeal cancer treated with radiation therapy and followed by surgery    five years ago. 2. History of hiatal hernia with reflux. 3. History of hypertension. 4. Kidney stones. 5. Trouble with inner ear in the past two months.  No diabetes or hyperlipidemia.  SOCIAL HISTORY:  He is married.  He has two children.  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  He smoked 15-pack-years and quit 25 years ago.  FAMILY HISTORY:  His father died at 41.  He had his first MI at age 9.  His mother died at 72 of dementia.  Three sister alive and well.  Two brothers, two have hypertension  and one has heart problems, but he does not know the specifics.  REVIEW OF SYSTEMS:  Negative for fever, chills, sweats, weight change, or adenopathy.  HEENT:  Normal.  Cardiopulmonary:  He has chest pain, shortness of breath, and dyspnea on exertion.  He has no edema, palpitations, syncope, cough, or wheezing.  Denies urinary frequency, dysuria, or straining.  Denies any weakness, numbness, mood disturbance, anxiety, or depression.  Denies any joint swelling, deformities, or arthralgias.  He does have significant gastroesophageal reflux symptoms.  He denies any melena, nausea, vomiting, or diarrhea.  PHYSICAL EXAMINATION:  This is a pleasant, 70 year old, married, white male in no acute distress.  VITAL SIGNS:  Blood pressure 120/88, pulse 62, respirations 16.  WEIGHT:  168-1/2 pounds.  HEENT:  The head is normocephalic without trauma.  Extraocular movements are intact.  Pupils are equal and reactive to light and accommodation.  NECK:  Without JVD, HJR, bruit, or thyroid enlargement.  No lymphadenopathy.  LUNGS:  Clear anterior, posterior, and lateral.  HEART:  Regular rate and rhythm at 60 beats per minute.  Normal S1 and S2. Positive S4.  No significant murmur heard.  SKIN:  No rashes or lesions.  ABDOMEN:  Soft without organomegaly, masses, lesions, or abnormal tenderness.  EXTREMITIES:  Without clubbing, cyanosis, or edema.  He has good distal pulses.  MUSCULOSKELETAL:  No joint deformities, effusions, or CVA tenderness.  NEUROLOGIC:  Without focal deficit.  LABORATORY DATA:  EKG with normal sinus rhythm.  IMPRESSION: 1. Unstable angina. 2. Hypertension. 3. Family history of coronary artery disease. 4. History of laryngeal cancer, status post radiation and surgery five years    ago by Margit Banda. Jearld Fenton, M.D. 5. Former smoker.  PLAN:  Admit the patient and schedule him for a cardiac catheterization at 7:30 a.m. tomorrow morning. Dictated by:   Jacolyn Reedy,  P.A.C. Attending:  Jesse Sans. Daleen Squibb, M.D. [facility] DD:  12/20/01 TD:  12/20/01 Job: [id number]

## 2010-12-11 NOTE — H&P (Signed)
NAME:  Kenneth [name], Kenneth [name] NO.:  [id number]   MEDICAL RECORD NO.:  [id number]          PATIENT TYPE:  AMB   LOCATION:  DAY                           FACILITY:  [facility]   PHYSICIAN:  Dalia Heading, M.D.  DATE OF BIRTH:  Jul 19, 1941   DATE OF ADMISSION:  06/30/2006  DATE OF DISCHARGE:  LH                              HISTORY & PHYSICAL   CHIEF COMPLAINT:  Intractable GERD.   HISTORY OF PRESENT ILLNESS:  The patient is a 70 year old white male who  is referred for endoscopic evaluation.  He needs an EGD for intractable  GERD.  He states he has recently been changed to Prevacid which has not  been helpful.  He does have a known history of a hiatal hernia and a  shortened esophagus.  He denies any fever, chills, or jaundice.  He last  had an EGD in 2002 and his CLO-test was negative.   PAST MEDICAL HISTORY:  1. Thyroid cancer.  2. Hypertension.   PAST SURGICAL HISTORY:  1. Laparoscopic cholecystectomy.  2. Laryngeal surgery.   CURRENT MEDICATIONS:  1. A blood pressure pill.  2. Prevacid.   ALLERGIES:  1. PENICILLIN.  2. ADVANTAN.   REVIEW OF SYSTEMS:  Noncontributory.   PHYSICAL EXAMINATION:  GENERAL:  The patient is a well-developed, well-  nourished white male in no acute distress.  LUNGS:  Clear to auscultation with equal breath sounds bilaterally.  HEART:  A regular rate and rhythm without S3, S4, or murmurs.  ABDOMEN:  Soft, nontender, nondistended.  No hepatosplenomegaly or  masses were noted.   IMPRESSION:  Intractable gastroesophageal reflux disease.   PLAN:  The patient is scheduled for an EGD on June 30, 2006.  The  risks and benefits of the procedure including bleeding and perforation  were fully explained to the patient, who gave informed consent.      Dalia Heading, M.D.  Electronically Signed     MAJ/MEDQ  D:  06/28/2006  T:  06/29/2006  Job:  [id number]   cc:   Short Stay, Joaquin Courts. Ouida Sills, MD  Fax: [phone]

## 2010-12-11 NOTE — Op Note (Signed)
NAME:  Kenneth [name], Kenneth [name] NO.:  [id number]   MEDICAL RECORD NO.:  [id number]          PATIENT TYPE:  AMB   LOCATION:  DAY                           FACILITY:  [facility]   PHYSICIAN:  Dalia Heading, M.D.  DATE OF BIRTH:  [birth year]   DATE OF PROCEDURE:  01/31/2006  DATE OF DISCHARGE:                                 OPERATIVE REPORT   PREOPERATIVE DIAGNOSIS:  Cholecystitis, cholelithiasis.   POSTOPERATIVE DIAGNOSIS:  Cholecystitis, cholelithiasis.   PROCEDURE:  Laparoscopic cholecystectomy.   SURGEON:  Dr. Franky Macho.   ANESTHESIA:  General endotracheal.   INDICATIONS:  The patient is a 70 year old white male who was referred for  evaluation and treatment of biliary colic secondary to cholelithiasis.  Risks and benefits of the procedure including bleeding, infection,  hepatobiliary injury, and the possibility of an open procedure were fully  explained to the patient, who gave informed consent.   PROCEDURE NOTE:  The patient was placed in supine position.  After induction  of general endotracheal anesthesia, the abdomen was prepped and draped in  the usual sterile technique with Betadine.  Surgical site confirmation was  performed.   A supraumbilical incision was made down to the fascia.  Veress needle was  introduced into the abdominal cavity and confirmation of placement was done  using the saline drop test.  The abdomen was then inflated to 16 mmHg  pressure.  An 11-mm trocar was introduced into the abdominal cavity under  direct visualization without difficulty.  The patient was placed in reversed  Trendelenburg position.  An additional 11-mm trocar was placed in the  epigastric region and 5-mm trocars were placed in the right upper quadrant,  right flank regions.  Liver was inspected, noted to within normal limits.  Gallbladder was retracted superior and laterally.  The dissection was begun  around the infundibulum of the gallbladder.  The cystic duct was  first  identified.  Its juncture to the infundibulum fully identified.  Endoclips  placed proximally distally on cystic duct and cystic duct was divided.  This  was likewise done cystic artery.  The gallbladder then freed away from the  gallbladder fossa using Bovie electrocautery.  The gallbladder was delivered  through the epigastric trocar site using EndoCatch bag.  Gallbladder fossa  was inspected.  No abnormal bleeding or bile leakage was noted.  Surgicel  was placed in the gallbladder fossa.  All fluid and air were then evacuated  from the abdominal cavity prior to removal of the trocars.   All wounds were irrigated normal saline.  All wounds were injected with 0.5%  Sensorcaine.  The supraumbilical fascia was reapproximated using a 0 Vicryl  interrupted suture.  All skin incisions were closed using staples.  Betadine  ointment, dry sterile dressings were applied.   All tape and needle counts correct the end the procedure.  The patient was  extubated in the operating room went back to recovery room awake in stable  condition.   COMPLICATIONS:  None.   SPECIMEN:  Gallbladder with stones.   BLOOD LOSS:  Minimal.  Dalia Heading, M.D.  Electronically Signed     MAJ/MEDQ  D:  01/31/2006  T:  01/31/2006  Job:  [id number]   cc:   Kingsley Callander. Ouida Sills, MD  Fax: [phone]

## 2010-12-11 NOTE — H&P (Signed)
NAME:  Kenneth [name], Kenneth [name] NO.:  [id number]   MEDICAL RECORD NO.:  [id number]          PATIENT TYPE:  INP   LOCATION:  A212                          FACILITY:  [facility]   PHYSICIAN:  Kingsley Callander. Ouida Sills, MD       DATE OF BIRTH:  25-Mar-1941   DATE OF ADMISSION:  05/24/2006  DATE OF DISCHARGE:  10/31/2007LH                                HISTORY & PHYSICAL   CHIEF COMPLAINT:  Chest pain.   HISTORY OF PRESENT ILLNESS:  This patient is a 70 year old white male with a  history of coronary artery disease and coronary artery bypass surgery who  presented to the emergency room for evaluation of chest pain.  He had  experienced some tightness in the chest the past 2 days.  The precipitating  event, though, occurred while he was sitting down at home. He began feeling  tightness in his chest with radiation into his back and into his lower  teeth.  There was no diaphoresis, shortness of breath, syncope or vomiting.  He had been working in his yard earlier in the day and had felt tightness in  his chest while riding a four-wheeler aerating his yard.  He did not use  nitroglycerin.  He presented to the emergency room where his cardiac markers  were initially negative.  His EKG revealed sinus bradycardia with  nonspecific anteroseptal T-wave changes.  He was hospitalized, though, for  further cardiac enzymes and additional investigation. He had undergone a  negative Myoview stress test in April 2006.  He has a history of felt to be  benign chest nodule with a last chest CT in June 2007. He is due for another  in December 2007.   PAST HISTORY:  1. 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  2. Laryngeal carcinoma in 1998, treated with radiation therapy and      surgery.  3. Hypertriglyceridemia.  TriCor was recently added earlier this month.  4. Anxiety.  5. GERD.  6. BPH.  7. Cholecystectomy earlier this year.   MEDICATIONS:  1. Metoprolol 25 mg b.i.d..  2. Lovastatin 20 mg daily.  3.  Aspirin 325 mg daily.  4. Paxil 10 mg daily.  5. TriCor 145 mg daily.  6. Prilosec 20 mg daily.   ALLERGIES:  PENICILLIN.   FAMILY HISTORY:  His father had heart disease.  Brother died of heart  disease.  His mother had Alzheimer's disease. Another brother has had  hypertension   SOCIAL HISTORY:  He does not use alcohol, tobacco or recreational drugs   REVIEW OF SYSTEMS:  He has a hearing aid, glasses, and upper denture. No  difficulty breathing, chronic cough, weight loss, heartburn, change in bowel  habits.   PHYSICAL EXAMINATION:  VITAL SIGNS: Temperature 97.2, pulse 56, respirations  20, blood pressure 127/67.  GENERAL:  Alert and comfortable appearing.  HEENT: Eyes and pharynx unremarkable.  His voice is hoarse chronically  NECK:  No JVD or thyromegaly.  LUNGS:  Clear.  HEART:  Regular, bradycardic, no murmurs.  ABDOMEN:  Nontender.  No hepatosplenomegaly.  EXTREMITIES: Normal pulses.  No clubbing or edema.  NEUROLOGIC: Intact.  LYMPH NODES: No enlargement.  SKIN:  Normal.   LABORATORY DATA:  White count 7.3, hemoglobin 14.3, platelets 229,000.  Sodium 142, potassium 3.9, glucose 96, creatinine 1.6 with a recent  creatinine of 1.2 earlier this month. Calcium 9.2. CK 95, troponin 0.01.   Chest x-ray reveals cardiomegaly and bilateral pleural parenchymal scarring,   EKG reveals sinus bradycardia with nonspecific T-wave changes in the  anteroseptal leads.   IMPRESSION:  1. Chest pain with a history of underlying coronary disease and previous      bypass surgery.  He is being hospitalized for serial cardiac enzymes      and additional evaluation.  Will obtain a cardiology consult. His case      was discussed with Dr. Dietrich Pates. Will likely proceed with a repeat      stress test assuming his cardiac enzymes return normal. Continue      metoprolol, aspirin, lovastatin and TriCor.  2. History of laryngeal carcinoma.  3. History of lung scarring. Will obtain his followup CT  in December.  4. Anxiety.  Continue Paxil.  5. Gastroesophageal reflux disease.  Continue Prilosec.  6. Benign prostatic hypertrophy, stable.  7. Elevated creatinine.  Will recheck.      Kingsley Callander. Ouida Sills, MD  Electronically Signed     ROF/MEDQ  D:  05/25/2006  T:  05/25/2006  Job:  [id number]

## 2010-12-11 NOTE — Procedures (Signed)
NAME:  Kenneth [name], Kenneth [name] NO.:  [id number]   MEDICAL RECORD NO.:  [id number]         PATIENT TYPE:  REC   LOCATION:                                 FACILITY:   PHYSICIAN:  Kingsley Callander. Ouida Sills, MD       DATE OF BIRTH:  08/23/1940   DATE OF PROCEDURE:  DATE OF DISCHARGE:                                    STRESS TEST   [name] underwent a Myoview stress test for evaluation of recent chest  pain.  He has a history of coronary heart disease and previous bypass  surgery.  He exercised 10 minutes (1 minute in stage IV of the Bruce  protocol) obtaining a maximal heart rate of 137 (88% of the age predicted  maximal heart rate) at a work load of 12.8 METs and discontinued exercise  due to fatigue.  There were no symptoms of chest pain.  There were no  arrythmias.  There were no ST segment changes diagnostic of ischemia.  Baseline electrocardiogram revealed sinus bradycardia at 49 beats per  minute.  The heart rate response was slowed due to beta blockade from  metoprolol.   IMPRESSION:  No evidence of exercise-induced ischemia.  Myoview images  pending.      ROF/MEDQ  D:  11/05/2004  T:  11/05/2004  Job:  [id number]

## 2010-12-11 NOTE — Assessment & Plan Note (Signed)
[facility]                         GASTROENTEROLOGY OFFICE NOTE   MELFORD, TULLIER                     MRN:          [id number]  DATE:08/04/2006                            DOB:          03/09/1941    Kenneth [name] is a 70 year old white male with coronary artery disease  and previous bypass surgery.  He is referred today for evaluation of  substernal chest pain, radiating through his back which has been present  for the last 2 months.   [name] has voluminous records, a very involved past history.  He  has had acid reflux for many years and saw Dr. Kinnie Scales in 1989 and had  endoscopy at that time which was fairly unremarkable.  From what I can  ascertain since that time he has had a diagnosis of laryngopharyngeal  carcinoma and has had laryngectomy performed.  Apparently his ENT  physician (Dr. Jearld Fenton) felt that this was secondary to chronic acid  reflux.  In addition to surgery he underwent radiation therapy.  He has  numerous cardiovascular problems, is followed by Dr. Juanito Doom, also has  classic upper airway obstruction and is followed by Dr. Sherene Sires and had a  right middle lobe density noted within the last 2 years with a negative  biopsy.  Last exercise-induced Myoview stress study in April 2006 was  negative for any cardiac ischemia.   The patient apparently developed nausea and anorexia this past summer  and underwent laparoscopic cholecystectomy by Dr. Lovell Sheehan.  Two months  postoperative he developed rather severe subxiphoid pain.  He has been  in the emergency room on several occasions.  As per above, repeat  cardiac examinations during his hospitalizations again have been  negative as has CBC, metabolic profile, and other screening laboratory  parameters.  He underwent endoscopy by Dr. Geryl Councilman because of  this chest pain in early December that is described as being entirely  normal.  He has been treated with PPIs and Carafate  without improvement  in his pain, which he describes as almost a constant pain made worse by  eating with the gas and bloating the upper abdomen.  There are no  specific hepatobiliary complaints and no dysphagia.  He has had no  significant anorexia or weight loss.   The patient related he had some diarrhea in December but this has  ceased.  He is having regular bowel movements without melena or  hematochezia.  He denies frequent antibiotic use.  The only medications  he is on at this time are Paxil 10 mg a day, metoprolol 25 mg twice a  day, lovastatin 20 mg a day, aspirin 325 mg a day, over the counter  Prilosec 20 mg daily occasionally twice a day, and fish oil 2 tablets  twice a day.  HE HAS HAD REACTION IN THE PAST TO PENICILLIN, ATIVAN,  SULFUR, AND ERYTHROMYCIN.   PAST MEDICAL HISTORY:  Otherwise remarkable for essential hypertension,  current kidney stones, chronic sinusitis, as mentioned above multiple  cardiovascular problems.  He also suffers from hypertriglyceridemia,  chronic anxiety syndrome.   Reviewing his discharge summary from  October 31 to May 27, 2006 he  apparently had repeat coronary angiography at that time which was  unremarkable.   FAMILY HISTORY:  Remarkable for cardiovascular issues in several of his  brothers, but no known gastrointestinal problems.   SOCIAL HISTORY:  He is married, lives with his wife.  He is a retired  Scientist, product/process development.  He does not smoke or use ethanol.   REVIEW OF SYSTEMS:  Otherwise noncontributory except for chronic fatigue  and shortness of breath exertion.   EXAMINATION:  GENERAL:  He is a healthy appearing white male in no acute  distress appearing his stated age.  VITAL SIGNS:  He is 5 foot 6 inches, weight 184 pounds.  Blood pressure  121/70, pulse was 68 and regular.  Can not appreciate stigmata of  chronic liver disease.  CHEST:  Clear to percussion and auscultation.  CARDIAC:  He was in a regular rhythm without  significant murmurs,  gallops, or rubs on cardiac exam.  I could not appreciate hepatosplenomegaly, abdominal masses or  significant tenderness.  Bowel sounds were generally normal.  RECTAL:  Deferred.  MENTAL STATUS:  Normal.   ASSESSMENT:  [name] has very atypical epigastric-subxiphoid pain  but has a long history of rather severe acid reflux.  I suspect that he  is better at this time but needs more organized acid suppression.   IMPRESSION:  He is not taking his Prilosec correctly.  He has otherwise  had a rather thorough work up to date.   RECOMMENDATIONS:  1. Reflux regimen.  Reviewed with he and his wife, patient education      movie shown to patient.  2. Prescribed Aciphex 20 mg 30 minutes before meals twice a day.  3. Other medications as per Dr. Ouida Sills.  4. If he continues to have problems we will perform 24 hour pH probe      testing, and esophageal monometry while he is on therapy to be sure      that we are getting adequate acid suppression.     Vania Rea. Jarold Motto, MD, Caleen Essex, FAGA  Electronically Signed    DRP/MedQ  DD: 08/04/2006  DT: 08/04/2006  Job #: [id number]   cc:   Kingsley Callander. Ouida Sills, MD

## 2010-12-11 NOTE — Cardiovascular Report (Signed)
[place]  Patient:    ALPHEUS, STIFF Visit Number: [id number] MRN: [id number]          Service Type: OUT Location: RAD Attending Physician:  [name] Dictated by:   Veneda Melter, M.D. Proc. Date: 12/21/01 Admit Date:  12/20/2001 Discharge Date: 12/20/2001   CC:         Thomas C. Daleen Squibb, M.D. [facility] [place]  [name], M.D.   Cardiac Catheterization  PROCEDURES PERFORMED: 1. Left heart catheterization. 2. Left ventriculogram. 3. Selective coronary angiography. 4. Left subclavian angiogram. 5. Abdominal aortogram.  DIAGNOSES: 1. Left main coronary artery disease. 2. Normal left ventricular systolic function. 3. Peripheral vascular disease.  HISTORY: The patient is a 70 year old gentleman with a history of tobacco use, esophageal reflux disease and laryngeal cancer, treated with radiation approximately five years ago, who presents with crescendo angina. The patient underwent stress test showing significant ST depression as well as symptoms. He was admitted to the hospital and stabilized medically. He presents for further cardiac assessment.  TECHNIQUE: After informed consent was obtained, the patient was brought to the cardiac catheterization lab where a 6 French sheath was placed in the right femoral artery using the modified Seldinger technique. A 6 [place] and JR4 catheters were then used to engage the left and right coronary arteries and selective angiography performed in various projections using manual injections of contrast. A JR4 catheter was then placed in the left subclavian artery and nonselective opacification of the internal mammary artery performed. The pigtail catheter was advanced in the left ventricle and left ventriculogram performed using power injections of contrast. The pigtail catheter was brought back in the descending aorta and abdominal aortogram performed using power injections of contrast. At the termination of  the case, the catheters and sheath were removed and manual pressure applied until adequate hemostasis was achieved. The patient tolerated the procedure well and was transferred to the ward in stable condition.  FINDINGS: Findings are as follows: 1. Left main trunk: This vessel is severely discharged along its entire    course with a distal narrowing of at least 70-80% extending into the    ostium of the LAD. 2. LAD: This is a MCV that provides a first diagonal branch in the    proximal segment. The LAD has an ostial narrowing of 70%. There is then    further narrowing of 50% after the diagonal branch. The distal LAD has    mild diffuse disease and tapers as it approaches the apex. The diagonal    branch is a medium caliber vessel with a moderate narrowing of 50% in the    proximal segment. 3. Left circumflex artery: This is a medium caliber vessel consisting of    a major first marginal branch in the proximal segment. The ostium of the    AV circumflex has narrowing of 60% encroaching from the left main    trunk. The marginal branch has mild irregularities. 4. Right coronary artery is dominant. This is a medium caliber vessel that    provides the posterior descending artery and two small posterior    ventricular branches in the terminal segment. The right coronary artery    has mild irregularities of 20%. 5. Left subclavian artery is widely patent. Internal mammary artery is patent    and extends to the diaphragm.  ABDOMINAL AORTOGRAM: Abdominal aorta is of normal caliber. There is mild tortuosity in the infrarenal segment suggesting early aneurysmal formation. The renal arteries are single  and patent bilaterally. The left iliac artery is patent with mild irregularities. The right iliac artery has a long severe narrowing of 70% in the external segment.  LEFT VENTRICULOGRAM: Normal end-systolic and end-diastolic dimensions. Overall left ventricular function is well preserved, ejection  fraction of greater than 55%. No mitral regurgitation. Left ventricular pressure is 140/10, aortic is 140/80, LVEDP equals 20.  ASSESSMENT AND PLAN: The patient is a 70 year old gentleman who presents with crescendo and now unstable angina. He has critical left main coronary artery disease with preserved left ventricular function. He will be referred for surgical revascularization. Dictated by:   Veneda Melter, M.D. Attending Physician:  [name] DD:  12/21/01 TD:  12/22/01 Job: [id number]

## 2010-12-11 NOTE — Op Note (Signed)
NAME:  Kenneth [name], Kenneth [name] NO.:  [id number]   MEDICAL RECORD NO.:  [id number]          PATIENT TYPE:  AMB   LOCATION:  SDS                          FACILITY:  [facility]   PHYSICIAN:  Suzanna Obey, M.D.       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   SURGICAL PROCEDURE:  Microlaryngoscopy with laser of left arytenoid and  bronchoscopy.   ANESTHESIA:  General endotracheal tube.   ESTIMATED BLOOD LOSS:  Less than 5 mL.   INDICATIONS:  This is a 70 year old who has had some problems with  inspiratory stridor.  He has been worked up by Dr. Sherene Sires and found to have a  flow volume loop that showed inspiratory problems.  He also has dyspnea on  exertion.  He on examination in the office showed that his left arytenoid is  flopping over the glottis and narrowing the air passageway.  He also on CT  scan shows a small little area in his trachea that has been present for many  months and unchanged.  This is not causing his obstruction problems.  He was  informed of the risks and benefits of the procedure, including bleeding,  infection, scarring, worse voice, dysphagia, aspiration, and risk of the  anesthetic.  Also perforation of the trachea and pneumothorax.  All of his  questions were answered and consent was obtained.   OPERATION:  The patient was taken to the operating room and placed in the  supine position.  After an adequate general endotracheal tube anesthesia  with a #5.5 laser tube, he was examined with the scope.  On intubation he  did have the problem of the arytenoid flopping over the glottis, and it was  obstructing the glottic view.  Once the scope was  positioned, the laser was  fashioned and placed on 6 watts, and the patient's face and eyes were  covered with wet cloths.  The left arytenoid was then lasered, removing the  medial aspect of it with the laser and the soft tissue as well.  This then  had good hemostasis.  An adrenalin-soaked pledget was placed into the area.  The laser scope was then removed and the bronchoscope was placed.  There did  not appear to be any lesions that were in the trachea of significance that  justified biopsy.  He certainly had a nice, open trachea with no  obstruction.  The scope was removed.  A regular #6 endotracheal tube was  placed.  The patient appeared to have good hemostasis.  Removing  the endotracheal tube and placing the MacIntosh scope and viewing the  larynx, it did look like his glottis was substantially more open than preop  and prior to intubation.  The endotracheal tubes were placed and the patient  was awakened, brought to the recovery  room in stable condition, counts  correct.           ______________________________  Suzanna Obey, M.D.     JB/MEDQ  D:  07/16/2005  T:  07/18/2005  Job:  [id number]   cc:   Casimiro Needle B. Sherene Sires, M.D. [facility]  [address]  [place]  [place] [address]

## 2010-12-11 NOTE — Cardiovascular Report (Signed)
NAME:  Kenneth [name], Kenneth [name] NO.:  [id number]   MEDICAL RECORD NO.:  [id number]          PATIENT TYPE:  INP   LOCATION:  3705                         FACILITY:  [facility]   PHYSICIAN:  Bevelyn Buckles. Bensimhon, MDDATE OF BIRTH:  26-Aug-1940   DATE OF PROCEDURE:  05/25/2006  DATE OF DISCHARGE:                              CARDIAC CATHETERIZATION   Primary care physician is Kingsley Callander. Ouida Sills, MD.  Cardiologist, Gerrit Friends.  Dietrich Pates, MD.   PATIENT IDENTIFICATION:  Kenneth [name] is a very pleasant 70 year old male  with a history of coronary artery disease status post CABG, who was admitted  today through [facility] with chest pain which was concerning for unstable  angina.  His EKG had mild nonspecific ST-T wave changes.  Cardiac markers  were negative.  He is referred for diagnostic cardiac catheterization.  Given that his baseline creatinine was 1.6, he was hydrated with a bicarb  drip prior to catheterization.  Repeat creatinine was 1.2.   PROCEDURES PERFORMED:  1. Selective coronary angiography.  2. Left internal mammary artery/right internal mammary artery angiography.  3. Left heart catheterization.  4. Angio-Seal femoral closure device.   DESCRIPTION OF PROCEDURE:  The risks and benefits of the catheterization  were explained.  Consent was signed and placed on the chart.  A JL-3.5  catheter was used to image the left coronary system.  A JR-4 was used for  the right coronary as well as the LIMA graft.  The LIMA graft was imaged  with IM catheter, which was placed over a long exchange wire.  An angled  pigtail was used for the left heart catheterization and left ventriculogram,  which was shot with a hand injection in order to minimize contrast dye.  At  the end of the procedure, the patient had his right femoral arteriotomy site  closed with an Angio-Seal closure device.  There was good hemostasis.   Total contrast used:  110 mL of Omnipaque.   Central aortic pressure was  147/74 with a mean of 102.  LV pressure 103/0  with an EDP of 16.   Left main was diffusely diseased with a 90% lesion leading into the ostium  of the LAD and also partly into the left circumflex.   LAD was diffusely diseased with a 90% ostial lesion.  There was a diffuse  95% lesion in the midsection, and the distal LAD is seen filling from  competitive flow.  There is a small diagonal, which is also diffusely  diseased with 90% throughout.   Left circumflex is a moderate-sized vessel, made up primarily of a large  branching OM-1.  There is also a small distal OM-2.  There is an 80% lesion  in the ostium of the OM-1 with a 40-50% tubular lesion after this.   The right coronary artery is a large, dominant vessel that gives off an RV  branch, a large PDA and two small PLs.  There are just minor luminal  irregularities.   The LIMA to the LAD is widely patent.  It did does appear that the proximal  portion of the LIMA  is tethered to the chest wall with a small kink.  However, this is non-flow-limiting.  Distal LAD has moderate disease.  There  is also a small diagonal.   The ramus to the OM is widely patent and fills the OM nicely.  There is also  some backfilling of the small OM-2.   ASSESSMENT:  1. Severe two-vessel coronary artery disease with high-grade left main      stenosis as described above.  2. Left internal mammary artery to the left anterior descending artery is      widely patent, and the proximal portion of the graft appears somewhat      tethered to the chest wall with a small, non-flow-limiting kink in the      midsection.  3. Right internal mammary artery to the obtuse marginal #1 is widely      patent.  4. No obvious culprit lesion.   PLAN/DISCUSSION:  The source of his chest pain remains unclear to me.  There  is obviously a potential for minor ischemia in the distal circumflex and OM-  2; however, I do not think this is the source of his resting chest pain.   We  will check a D-dimer and continue to follow him.  He may also need a GI  workup.      Bevelyn Buckles. Bensimhon, MD  Electronically Signed     DRB/MEDQ  D:  05/25/2006  T:  05/26/2006  Job:  [id number]

## 2010-12-11 NOTE — Procedures (Signed)
[facility]  Patient:    Kenneth [name], Kenneth [name] Visit Number: [id number] MRN: [id number]          Service Type: MED Location: 2000 2027 01 Attending Physician:  Cain Sieve Dictated by:   [name], M.D. Proc. Date: 12/20/01 Admit Date:  12/20/2001                                Stress Test  Mr. Bayard exercised five minutes 19 seconds (two minutes 19 seconds into stage 2 of the Bruce protocol) obtaining a maximal heart rate of 135 (85% of the age predicted maximal heart rate) at a work load of 7 METS and discontinued exercise after developing substernal chest tightness.  There was 2 mm upsloping ST segment depression in the inferolateral leads during stage 2.  There was 1 mm downsloping ST segment depression in the inferolateral leads during recovery.  The baseline EKG revealed normal sinus rhythm at 70 beats per minute with a blood pressure 130/84.  IMPRESSION:  Abnormal exercise stress test with evidence of inferolateral ischemia and symptoms consistent with angina.  Cardiolite images are pending. Dictated by:   [name], M.D. Attending Physician:  Cain Sieve DD:  12/20/01 TD:  12/20/01 Job: [id number]

## 2010-12-11 NOTE — Discharge Summary (Signed)
[facility]  Patient:    Kenneth [name], Kenneth [name] Visit Number: [id number] MRN: [id number]          Service Type: OBV Location: 2A A206 01 Attending Physician:  [name] Dictated by:   [name], M.D. Admit Date:  01/04/2002 Discharge Date: 01/05/2002                             Discharge Summary  DISCHARGE DIAGNOSES: 1. Near syncope. 2. Recent coronary artery bypass graft x3. 3. History of laryngeal cancer. 4. Hypertension. 5. Gastroesophageal reflux disease.  HOSPITAL COURSE:  This patient is a 70 year old, white male, status post CABG x3 two weeks ago who was admitted after a near syncopal episode at home.  He was found to be bradycardic in the 50 range.  He was not hypotensive. Diltiazem was discontinued and Lopressor 25 mg b.i.d. was continued.  He underwent a CT scan of the head which revealed a small area of possible, remote white matter infarction in the right parietal area.  There were no acute abnormalities identified.  His symptoms resolved.  His ambulatory ability was normal.  He was felt to be stable for discharge on June 13.  Cardiac monitoring revealed no other abnormalities.  He was neurologically intact.  His hemoglobin had improved to 13.4.  His electrolytes were normal. Cardiac enzymes were normal.  FOLLOWUP:  He will follow up in the office in one week for repeat EKG.  DISCHARGE MEDICATIONS: 1. Metoprolol 25 mg b.i.d. 2. Aspirin q.d. 3. Omeprazole 20 mg q.d. 4. Norvasc 7.5 mg q.d. 5. Tramadol q.4h. p.r.n.  SPECIAL INSTRUCTIONS:  Stop taking Diltiazem. Dictated by:   [name], M.D. Attending Physician:  [name] DD:  01/05/02 TD:  01/07/02 Job: [id number]

## 2010-12-11 NOTE — Consult Note (Signed)
[place]  Patient:    Kenneth [name], Kenneth [name] Visit Number: [id number] MRN: [id number]          Service Type: OUT Location: RAD Attending Physician:  [name] Dictated by:   Salvatore Decent. Dorris Fetch, M.D. Proc. Date: 12/21/01 Admit Date:  12/20/2001 Discharge Date: 12/20/2001   CC:         Thomas C. Daleen Squibb, M.D. [facility]  Veneda Melter, M.D. [facility]  [name], M.D.   Consultation Report  REASON FOR CONSULTATION:  Left main coronary disease.  CHIEF COMPLAINT:  Chest pain.  HISTORY OF PRESENT ILLNESS:  Kenneth [name] is a 70 year old gentleman with no previous cardiac history.  He has a two to three month history of exertional chest pressure which radiates to his back and is associated with shortness of breath.  There is no arm pain or nausea or vomiting or diaphoresis.  Over the past couple of weeks his pain has been progressive with a crescendo pattern with increase in pain which is more severe and more prolonged with less activity.  He had a stress test which was positive at four minutes and 45 seconds with a heart rate of 130.  He developed 2-3 mm ST depression inferolaterally.  This morning he underwent cardiac catheterization which revealed an 80% left main stenosis involving the ostium of the LAD and circumflex.  He had minimal irregularity in the right coronary.  LV function was preserved with ejection fraction of 55%.  There was no mitral regurgitation.  He does have peripheral vascular disease with 70% external iliac stenosis.  He currently is pain-free on IV heparin.  CURRENT MEDICATIONS: 1. Prilosec 20 mg p.o. q.d. 2. Univasc 7.5 mg p.o. q.d.  ALLERGIES:  PENICILLIN causes itching and ATIVAN causes confusion.  PAST MEDICAL HISTORY:  Significant for laryngeal cancer treated with radiation therapy followed by surgery five years ago with no evidence of recurrence.  He has hiatal hernia with reflux, hypertension, nephrolithiasis.  He  denies diabetes.  FAMILY HISTORY:  Father survived to age 17.  Did have an MI at age 69.  He has a brother with heart problems.  SOCIAL HISTORY:  He is married.  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 He has two children.  He has a 15-pack-year history of smoking which he quit 25 years ago.  REVIEW OF SYSTEMS:  Chronic hoarseness.  No fevers, chills, sweats.  He denies orthopnea, paroxysmal nocturnal dyspnea, or peripheral edema.  No change in bowel or bladder habits.  No claudication.  All other systems are negative.  PHYSICAL EXAMINATION  GENERAL:  [name] is a 70 year old white male in no acute distress.  He is well-developed and well-nourished.  He is hoarse.  VITAL SIGNS:  Blood pressure 128/78, pulse 63 and regular, respirations 16.  NEUROLOGIC:  He is alert and oriented x3 with no focal motor deficits.  HEENT:  Unremarkable.  NECK:  There is radiation change on the anterior neck.  There is no thyromegaly, adenopathy, or bruits.  LUNGS:  Clear to auscultation and percussion.  CARDIAC:  Regular rate and rhythm.  Normal S1 and S2.  There is an S4.  There is no rub or murmur.  ABDOMEN:  Soft and nontender.  EXTREMITIES:  Without clubbing, cyanosis, edema.  He has 1+ dorsalis pedis on the right, 2+ on the left, 2+ radial pulses bilaterally.  SKIN:  Warm, pink, and dry.  LABORATORY DATA:  Cardiac catheterization reveals 80% distal left main stenosis, 50% stenosis  in the large diagonal which really appears to be a bifid LAD system with a large anterior branch and a septal perforating branch. He also has a 60% stenosis at the takeoff of the circumflex with minimal irregularities in the right coronary.  EKG from the stress test shows 3 mm ST depression across the precordium.  His CPK on admission was 60 with an MB of 0.8.  Sodium 141, potassium 4.0, BUN and creatinine 17 and 1.0, glucose 89.  PT 12.3, PTT 26.  White count 5.2, hematocrit 44, platelets  238,000.  IMPRESSION:  Kenneth [name] is a 70 year old gentleman who presents with crescendo angina.  He had an early positive stress test and now has, by catheterization, tight left main disease.  Coronary artery bypass grafting is indicated for survival benefit and relief of symptoms.  The indications, risks, benefits, and alternative procedures were discussed in detail with the patient and his family.  They understand that the risks of surgery include, but are not limited to, death, stroke, myocardial infarction, bleeding, possible need for blood transfusions, infection, deep venous thrombosis, pulmonary embolism, other organ system dysfunction including respiratory, renal, and gastrointestinal.  He understands and accepts these risks and agrees to proceed with surgery.  PLAN:  Urgent coronary artery bypass grafting.  There is an OR available.  The OR has been notified.  We will proceed with surgery as soon as possible. Patient is stable at the present time with no evidence of active ischemia. Dictated by:   Salvatore Decent Dorris Fetch, M.D. Attending Physician:  [name] DD:  12/21/01 TD:  12/22/01 Job: [id number]

## 2010-12-11 NOTE — Discharge Summary (Signed)
[place]  Patient:    Kenneth [name], Kenneth [name] Visit Number: [id number] MRN: [id number]          Service Type: MED Location: 2000 2030 01 Attending Physician:  Charlett Lango Dictated by:   Adair Patter, P.A. Admit Date:  12/20/2001 Discharge Date: 12/27/2001   CC:         Thomas C. Wall, M.D. [facility]  CVTS office   Discharge Summary  ADMISSION DIAGNOSIS: Coronary artery disease.  SECONDARY DIAGNOSES 1. Hypertension. 2. Postoperative anemia secondary to blood loss.  DISCHARGE DIAGNOSIS: Coronary artery disease.  HOSPITAL COURSE: The patient was admitted to [facility] on Dec 14, 2001, at which time he underwent a cardiac catheterization. This catheterization revealed he had significant coronary artery disease requiring surgical correction. Because of this Dr. Dorris Fetch was consulted, and on Dec 21, 2001, performed a coronary bypass graft x 3 with a sequential left internal mammary artery to the diagonal and left anterior descending coronary artery and the right internal mammary artery to the first obtuse marginal artery via the transverse sinus. No complications were noted during the procedure.  Postoperatively the patient had mild anemia secondary to blood loss which was tolerated well. His hemoglobin and hematocrit were stable at 11 and 32. His BUN and creatinine were 15 and 1.0. His potassium was 4.4. His postoperative course was also complicated by rapid atrial fibrillation. The patient was placed on an IV Cardizem drip and also was continued on Lopressor. He converted back into normal sinus rhythm and did not require any further intervention. His Cardizem was changed to oral and he remained in normal sinus rhythm throughout the remainder of his hospital course. No further complications were noted during his postoperative course and he was subsequently deemed stable for discharge on December 27, 2001.  DISCHARGE MEDICATIONS 1.  Ultram 50 mg 1 to 2 tablets q.4-6h. p.r.n. pain. 2. Aspirin 325 mg 1 q.d. 3. Prilosec 20 mg 1 q.d. 4. Lopressor 50 mg 1/2 tablet b.i.d. 5. Cardizem 120 mg 1 q.d. 6. Lasix 40 mg 1 q.d. x 5 days. 7. Potassium chloride 20 mEq 1 q.d. x 5 days.  DISCHARGE INSTRUCTIONS: Activity, the patient was told to avoid driving and strenuous activity and lifting heavy objects. He was told to walk and to continue breathing exercises. Discharge diet low fat, low salt. Wound care, the patient will take a shower and clean his incisions with soap and water.  DISPOSITION: Discharged to home.  FOLLOW UP: The patient is to see his cardiologist, Dr. Daleen Squibb, on January 08, 2002, at 11 oclock a.m. He was told to see Dr. Dorris Fetch on Wednesday, January 17, 2002, at 10:30 a.m. Dictated by:   Adair Patter, P.A. Attending Physician:  Charlett Lango DD:  12/26/01 TD:  12/27/01 Job: [id number]

## 2010-12-11 NOTE — Procedures (Signed)
   NAME:  Kenneth [name], Kenneth [name] NO.:  [id number]   MEDICAL RECORD NO.:  [id number]                   PATIENT TYPE:  PREC   LOCATION:                                       FACILITY:   PHYSICIAN:  Maisie Fus C. Wall, M.D.                DATE OF BIRTH:  [birth year]   DATE OF PROCEDURE:  12/28/2002  DATE OF DISCHARGE:                                    STRESS TEST   STRESS CARDIOLITE:   CARDIOLOGIST:  Maisie Fus C. Wall, M.D.   SUMMARY OF HISTORY:  [name] is a 71 year old white male who underwent  bypass surgery last year.  Since that time he has been doing well.  He has  not had any problems with chest discomfort or shortness of breath.  He  returned to our office on Dec 11, 2002 to see Dr. Daleen Squibb for evaluation.  Dr.  Daleen Squibb felt that he should have a surveillance Cardiolite at one year post  bypass surgery.  He has a history of hyperlipidemia, hypertension, tongue  claudication status post laryngeal cancer and radiation treatment.   Resting EKG showed normal sinus rhythm, normal axis, nonspecific ST-T wave  changes.  Resting blood pressure is 134/86.  Utilizing the Bruce  protocol,  [name] ambulated a total of 11 minutes 49 seconds, achieving a 12.9  MET.  Maximum blood pressure was 182/92, maximum heart rate was 125; this is  at 80% predicted maximum heart rate.  Throughout the test he did not have  any EKG changes.  He did complain of some leg fatigue and shortness of  breath toward the end of the treadmill.  The treadmill stress test was  discontinued secondary to his symptoms, because the patient stated that he  could not go any longer.  In recovery he rapidly resumed normal heart rate  and blood pressure.   Final results and images are pending Dr. Vern Claude review.     Joellyn Rued, P.A. [facility]                    Thomas C. Wall, M.D.    EW/MEDQ  D:  12/28/2002  T:  12/28/2002  Job:  [id number]

## 2010-12-11 NOTE — Consult Note (Signed)
NAME:  Kenneth [name], POKE NO.:  [id number]   MEDICAL RECORD NO.:  [id number]          PATIENT TYPE:  INP   LOCATION:  A212                          FACILITY:  [facility]   PHYSICIAN:  Gerrit Friends. Dietrich Pates, MD, FACCDATE OF BIRTH:  02-22-1941   DATE OF CONSULTATION:  05/25/2006  DATE OF DISCHARGE:  05/25/2006                                   CONSULTATION   REFERRING PHYSICIAN:  Dr. Ouida Sills.   PRIMARY CARDIOLOGIST:  Previously Dr. Daleen Squibb.   HISTORY OF PRESENT ILLNESS:  [name] with known coronary  disease presents with chest discomfort.  Kenneth [name] cardiac history  dates back to 2003, when catheterization revealed three-vessel and left main  disease.  He underwent CABG surgery including a left internal mammary to the  first diagonal and LAD, and a right internal mammary to the obtuse marginal  was seen via the transverse sinus.  The patient has done well  postoperatively with essentially no cardiopulmonary symptoms.  He has not  been seen regularly in the cardiology office.  On the day of admission, he  developed lower substernal chest fullness that persisted on and off all day.  This was of moderate intensity.  There was nothing that exacerbated or  improved the pain.  There were no associated symptoms.  There was radiation  to his teeth.  He presented to the emergency department where morphine  relieved his symptoms.  Cardiac markers have been negative.  His EKG  developed shallow T-wave inversions.   PAST MEDICAL HISTORY:  1. Kenneth [name] has had hypertension that has apparently been well-      controlled.  2. He has also had hyperlipidemia on low dose Statin therapy.  3. Otherwise most notable for laryngeal carcinoma requiring surgical      excision and radiation approximately 9 years ago.  4. He had a cholecystectomy in the recent past that was uncomplicated.  5. There is history of GERD.  6. He has had nephrolithiasis.  7. Inner ear problems.   ALLERGIES:  1. PENICILLIN.  2. ERYTHROMYCIN.  3. SULFA DRUGS, ARE REPORTED.   RECENT MEDICATIONS:  Have included aspirin, Prevacid, metoprolol, Paxil,  lovastatin and Tricor.   SOCIAL HISTORY:  Married with two children; retired from Restaurant manager, fast food work at  [employer]; 15 pack-year history of cigarette smoking that was  discontinued 25 years ago.   FAMILY HISTORY:  Father suffered his first MI at age 27, and died at age 28.  Mother had dementia.  Five siblings, two have had hypertension and one  cardiac problems.   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  He requires corrective  lenses and has upper dentures.  All other systems reviewed and are negative.   PHYSICAL EXAMINATION:  GENERAL:  A pleasant, well-appearing gentleman with  hoarse voice in no acute distress.  VITAL SIGNS:  The heart rate  is 55 and regular, blood pressure 135/70,  temperature 97.9, respirations 16.  HEENT:  Pupils equal, round, react to light; EOMs full; clear sclerae.  NECK:  Post-op scarring; mild jugular venous distension; normal carotid  upstrokes without bruits.  ENDOCRINE:  No thyromegaly.  HEMATOPOIETIC:  No adenopathy.  LUNGS:  Clear.  CARDIAC:  Normal first and second heart sounds; fourth heart sound present.  ABDOMEN:  Soft and nontender; no masses; no organomegaly; aortic pulsation  not palpable.  EXTREMITIES:  No bruits; normal distal pulses; no edema.  NEUROMUSCULAR:  Symmetric strength and tone; normal cranial nerves.  PSYCHIATRIC:  Alert and oriented; normal affect.  SKIN:  Multiple firm nodules over the lower extremities, perhaps  representing molluscum contagiosum.   EKG:  Initially sinus bradycardia with T-wave flattening in the anterior  leads;  subsequent tracing shows shallow T-wave inversions in those same  leads.   LABORATORY STUDIES:  Unremarkable.  Lipid profile is pending.   IMPRESSION:  Kenneth [name] presents with worrisome symptoms, particularly  the radiation of his discomfort to the jaw, with fairly modest EKG changes  and negative cardiac markers.  In light of his previously asymptomatic  status, the highly consistent nature of his pain with ischemia, and modest  albeit significant EKG abnormalities, I have recommended that he proceed  with coronary angiography.  The risks and benefits have been described to  him.  He agrees to undergo this study.  We will arrange for transfer to  [facility] and cardiac catheterization when the cath lab is available.  We  greatly appreciate the opportunity to become re-involved in Mr. Felker's  care and will try to arrange regular followup in the cardiology office for  him after discharge from [facility].      Gerrit Friends. Dietrich Pates, MD, [facility]  Electronically Signed     RMR/MEDQ  D:  05/25/2006  T:  05/25/2006  Job:  [id number]

## 2010-12-11 NOTE — H&P (Signed)
NAME:  Kenneth [name], Kenneth [name] NO.:  [id number]   MEDICAL RECORD NO.:  [id number]          PATIENT TYPE:  AMB   LOCATION:  DAY                           FACILITY:  [facility]   PHYSICIAN:  Dalia Heading, M.D.  DATE OF BIRTH:  07-22-1941   DATE OF ADMISSION:  DATE OF DISCHARGE:  LH                                HISTORY & PHYSICAL   CHIEF COMPLAINT:  Cholecystitis, cholelithiasis.   HISTORY OF PRESENT ILLNESS:  The patient is a 70 year old white male who is  referred for evaluation and treatment of cholecystitis secondary to  cholelithiasis.  He has been having intermittent episodes of right upper  quadrant abdominal pain with nausea, bloating, fatty food intolerance.  No  fever, chills, jaundice have been noted.   PAST MEDICAL HISTORY:  Hypertension, hiatal hernia, and thyroid cancer.   PAST SURGICAL HISTORY:  Laryngeal surgery.   CURRENT MEDICATIONS:  A blood pressure pill, Prilosec.   ALLERGIES:  PENICILLIN and ____________.   REVIEW OF SYSTEMS:  The patient denies drinking or smoking.  Denies any  other cardiopulmonary difficulties since the coronary arterial bypass graft.  He has had no angina or MI since that time.   PHYSICAL EXAMINATION:  GENERAL:  The patient is a well-developed, well-  nourished white male in no acute distress.  LUNGS:  Clear to auscultation with equal breath sounds bilaterally.  HEART:  Examination reveals regular rate and rhythm without S3, S4 or  murmurs.  ABDOMEN:  Soft and nondistended.  Tender in the right upper quadrant to  palpation.  No hepatosplenomegaly, masses, hernias are identified.   CT scan reveals cholelithiasis.   IMPRESSION:  Cholecystitis, cholelithiasis.   PLAN:  The patient is scheduled for laparoscopic cholecystectomy on January 31, 2006.  Risks and benefits of the procedure including bleeding, infection,  hepatobiliary injury and the possibility of an open procedure were fully  explained to the patient, gave informed  consent.      Dalia Heading, M.D.  Electronically Signed     MAJ/MEDQ  D:  01/27/2006  T:  01/27/2006  Job:  [id number]   cc:   Chase Picket at [facility]. Ouida Sills, MD  Fax: [phone]

## 2010-12-11 NOTE — H&P (Signed)
[facility]  Patient:    Kenneth [name], Kenneth [name] Visit Number: [id number] MRN: [id number]          Service Type: OBV Location: 2A A206 01 Attending Physician:  [name] Dictated by:   [name], M.D. Admit Date:  01/04/2002 Discharge Date: 01/05/2002                           History and Physical  CHIEF COMPLAINT:  Lightheadedness.  HISTORY OF PRESENT ILLNESS:  This patient is a 70 year old white male, two weeks status post CABG x 3, who began feeling lightheaded, dizzy, and the sensation that he would pass out after going on a brief six-minute walk outside his home.  When he was coming up his steps he felt he nearly passed out.  He was able to get in the house with the assistance of his wife.  He then experienced headache and some changes with his vision.  He presented to the emergency room where he was found to be hemodynamically stable.  His EKG revealed sinus bradycardia.  He experienced no chest pain.  He has had no recent falls or head trauma.  He had a postoperative anemia at [facility] which has resolved.  He had a bout of postoperative atrial fibrillation which spontaneously converted, and he has remained in sinus rhythm at least symptomatically.  He takes diltiazem and metoprolol.  He is also on Univasc for hypertension.  His strength has gradually improved.  His energy level has gradually improved.  He has had no past history of syncopal events.  PAST MEDICAL HISTORY: 1. Recent CABG x 3. 2. Laryngeal cancer, status post surgery and radiation five years ago. 3. Hypertension. 4. GERD. 5. Hypertriglyceridemia.  MEDICATIONS: 1. Omeprazole 20. 2. Univasc 7.5. 3. Cartia XT 120. 4. Metoprolol 25 b.i.d. 5. Tramadol p.r.n. 6. Aspirin q.d.  ALLERGIES:  PENICILLIN.  He is also intolerant of ERYTHROMYCIN and SULFA.  SOCIAL HISTORY:  He does not use tobacco or alcohol.  FAMILY HISTORY:  His father died of an MI at 80 and had hypertension.  He  has had two brothers with hypertension.  His mother died of Alzheimers disease in her early 48s.  REVIEW OF SYSTEMS:  He has been eating well.  He denies any problems with his bowel or bladder habits.  He is sleeping fairly well.  He denies fever.  He is chronically hoarse.  PHYSICAL EXAMINATION:  VITAL SIGNS:  Temperature 97.7, pulse 53, respirations 18, blood pressure 113/65.  GENERAL:  Alert, fully oriented, hoarse white male.  HEENT:  Pupils are equal, round, and reactive to light.  Extraocular movements intact.  Pharynx unremarkable.  NECK:  Postoperative changes in the neck, with no palpable mass.  LUNGS:  Clear.  HEART:  Bradycardic, with no murmurs.  Well-healed sternotomy scar.  ABDOMEN:  Nontender, with no hepatosplenomegaly.  EXTREMITIES:  Normal pulses.  No cyanosis, clubbing, or edema.  NEUROLOGIC:  Intact.  Finger-to-nose testing normal.  Romberg negative.  Gait stable.  Strength intact.  No slurred speech.  LABORATORY DATA:  White count 9.8, hemoglobin 13.4, platelets 635.  Sodium 136, potassium 4.0, glucose 99, BUN 15, creatinine 1.2, SGOT 23, calcium 9.1. CPK 31.  EKG reveals normal sinus rhythm, left anterior hemiblock, and inverted T-waves anteriorly.  IMPRESSION: 1. Near syncope two weeks status post coronary artery bypass grafting:  He is    being hospitalized for cardiac monitoring.  He now shows sinus bradycardia  on metoprolol and diltiazem.  He has had no recurrent symptoms of his    postoperative atrial fibrillation.  Will obtain a CT scan in light of his    associated symptoms of headache and visual changes.  He appears    neurologically intact, though, at this point and hemodynamically stable. 2. History of laryngeal cancer. 3. Gastroesophageal reflux disease. 4. Hypertension. Dictated by:   [name], M.D. Attending Physician:  [name] DD:  01/05/02 TD:  01/07/02 Job: [id number]

## 2010-12-11 NOTE — Letter (Signed)
June 09, 2006    Kingsley Callander. Ouida Sills, MD  [address]  [place], [place] [address]   RE:  DECODA, VAN  MRN:  [id number]  /  DOB:  12-Jul-1941   Dear Channing Mutters:   Mr. Pond returns to the office following a recent admission to [facility] for chest pain.  Coronary angiography revealed patency of the  internal mammary graft to the circumflex as well as the internal mammary  graft to the LAD.  LV function was not assessed.  There were no problems  with his renal function, as had been the case in a previous catheterization.  He experienced some additional chest discomfort soon after hospital  discharge but has been fine in recent days.   Lipids were assessed in hospital and were fairly good.   Current medications include Paxil 10 mg daily, metoprolol 25 mg b.i.d.,  lovastatin 20 mg daily, aspirin 325 mg daily, Prilosec 20 mg daily.   PHYSICAL EXAMINATION:  GENERAL:  On exam, a pleasant gentleman with a raspy  voice, in no acute distress.  VITAL SIGNS:  The blood pressure is 100/70, heart rate 60 and regular,  respirations 16, weight 184.  NECK:  No jugular venous distention.  Normal carotid upstrokes without  bruits.  LUNGS:  Clear.  CARDIAC:  Normal first and second heart sounds.  Fourth heart sound present.  ABDOMEN:  Soft and nontender.  No organomegaly.  EXTREMITIES:  Benign catheterization site.   IMPRESSION:  Mr. Raczkowski is doing generally well.  It would be desirable  for LDL to be even lower than the recently obtained value of 85, and  lovastatin dose will be increased to 40 mg daily.  Fish oil will be added to  his regimen.  Vaccinations are up to date.  I will plan to see this nice  gentleman again in one year.    Sincerely,      Gerrit Friends. Dietrich Pates, MD, [facility]  Electronically Signed    RMR/MedQ  DD: 06/09/2006  DT: 06/09/2006  Job #: [id number]

## 2011-04-23 LAB — CBC
HCT: 44.4
Hemoglobin: 14.9
MCHC: 33.6
MCV: 96.6
Platelets: 246
RBC: 4.59
RDW: 13.7
WBC: 6.7

## 2011-04-23 LAB — COMPREHENSIVE METABOLIC PANEL
ALT: 17
AST: 19
Albumin: 3.9
Alkaline Phosphatase: 157 — ABNORMAL HIGH
BUN: 20
CO2: 28
Calcium: 9.4
Chloride: 104
Creatinine, Ser: 1.13
GFR calc Af Amer: >60
GFR calc non Af Amer: >60
Glucose, Bld: 95
Potassium: 4.3
Sodium: 136
Total Bilirubin: 0.9
Total Protein: 7.3

## 2011-04-23 LAB — DIFFERENTIAL
Basophils Absolute: 0
Basophils Relative: 0
Eosinophils Absolute: 0.1
Eosinophils Relative: 2
Lymphocytes Relative: 15
Lymphs Abs: 1
Monocytes Absolute: 0.5
Monocytes Relative: 7
Neutro Abs: 5.1
Neutrophils Relative %: 76

## 2011-04-23 LAB — CARDIAC PANEL(CRET KIN+CKTOT+MB+TROPI)
CK, MB: 0.7
Relative Index: INVALID
Total CK: 41
Troponin I: <0.01

## 2011-07-23 ENCOUNTER — Encounter: Payer: Self-pay | Admitting: Cardiology

## 2011-08-12 ENCOUNTER — Encounter (HOSPITAL_COMMUNITY): Payer: Self-pay | Admitting: *Deleted

## 2011-08-12 ENCOUNTER — Emergency Department (HOSPITAL_COMMUNITY): Payer: Medicare Other

## 2011-08-12 ENCOUNTER — Inpatient Hospital Stay (HOSPITAL_COMMUNITY)
Admission: EM | Admit: 2011-08-12 | Discharge: 2011-08-13 | DRG: 313 | Disposition: A | Discharge status: Home / Self Care | Payer: Medicare Other | Attending: Internal Medicine | Admitting: Internal Medicine

## 2011-08-12 ENCOUNTER — Other Ambulatory Visit: Payer: Self-pay

## 2011-08-12 DIAGNOSIS — R0789 Other chest pain: Principal | ICD-10-CM | POA: Diagnosis present

## 2011-08-12 DIAGNOSIS — Z8521 Personal history of malignant neoplasm of larynx: Secondary | ICD-10-CM

## 2011-08-12 DIAGNOSIS — E782 Mixed hyperlipidemia: Secondary | ICD-10-CM | POA: Diagnosis present

## 2011-08-12 DIAGNOSIS — Z8673 Personal history of transient ischemic attack (TIA), and cerebral infarction without residual deficits: Secondary | ICD-10-CM

## 2011-08-12 DIAGNOSIS — Z951 Presence of aortocoronary bypass graft: Secondary | ICD-10-CM

## 2011-08-12 DIAGNOSIS — F411 Generalized anxiety disorder: Secondary | ICD-10-CM | POA: Diagnosis present

## 2011-08-12 DIAGNOSIS — I2510 Atherosclerotic heart disease of native coronary artery without angina pectoris: Secondary | ICD-10-CM

## 2011-08-12 DIAGNOSIS — Z7902 Long term (current) use of antithrombotics/antiplatelets: Secondary | ICD-10-CM

## 2011-08-12 DIAGNOSIS — E785 Hyperlipidemia, unspecified: Secondary | ICD-10-CM

## 2011-08-12 DIAGNOSIS — K219 Gastro-esophageal reflux disease without esophagitis: Secondary | ICD-10-CM | POA: Diagnosis present

## 2011-08-12 LAB — CBC
HCT: 43.5 % (ref 39.0–52.0)
Hemoglobin: 14.6 g/dL (ref 13.0–17.0)
MCH: 31.8 pg (ref 26.0–34.0)
MCHC: 33.6 g/dL (ref 30.0–36.0)
MCV: 94.8 fL (ref 78.0–100.0)
Platelets: 189 10*3/uL (ref 150–400)
RBC: 4.59 MIL/uL (ref 4.22–5.81)
RDW: 14 % (ref 11.5–15.5)
WBC: 6.4 10*3/uL (ref 4.0–10.5)

## 2011-08-12 LAB — COMPREHENSIVE METABOLIC PANEL
ALT: 22 U/L (ref 0–53)
AST: 18 U/L (ref 0–37)
Albumin: 3.6 g/dL (ref 3.5–5.2)
Alkaline Phosphatase: 111 U/L (ref 39–117)
BUN: 12 mg/dL (ref 6–23)
CO2: 31 mEq/L (ref 19–32)
Calcium: 9.7 mg/dL (ref 8.4–10.5)
Chloride: 102 mEq/L (ref 96–112)
Creatinine, Ser: 1.13 mg/dL (ref 0.50–1.35)
GFR calc Af Amer: 73 mL/min — ABNORMAL LOW (ref >90)
GFR calc non Af Amer: 63 mL/min — ABNORMAL LOW (ref >90)
Glucose, Bld: 93 mg/dL (ref 70–99)
Potassium: 4.2 mEq/L (ref 3.5–5.1)
Sodium: 140 mEq/L (ref 135–145)
Total Bilirubin: 0.3 mg/dL (ref 0.3–1.2)
Total Protein: 7 g/dL (ref 6.0–8.3)

## 2011-08-12 LAB — CARDIAC PANEL(CRET KIN+CKTOT+MB+TROPI)
CK, MB: 1.8 ng/mL (ref 0.3–4.0)
CK, MB: 1.8 ng/mL (ref 0.3–4.0)
Relative Index: INVALID (ref 0.0–2.5)
Relative Index: INVALID (ref 0.0–2.5)
Total CK: 66 U/L (ref 7–232)
Total CK: 62 U/L (ref 7–232)
Troponin I: <0.3 ng/mL (ref <0.30)
Troponin I: <0.3 ng/mL (ref <0.30)

## 2011-08-12 LAB — DIFFERENTIAL
Basophils Absolute: 0 10*3/uL (ref 0.0–0.1)
Basophils Relative: 0 % (ref 0–1)
Eosinophils Absolute: 0.1 10*3/uL (ref 0.0–0.7)
Eosinophils Relative: 2 % (ref 0–5)
Lymphocytes Relative: 34 % (ref 12–46)
Lymphs Abs: 2.2 10*3/uL (ref 0.7–4.0)
Monocytes Absolute: 0.5 10*3/uL (ref 0.1–1.0)
Monocytes Relative: 7 % (ref 3–12)
Neutro Abs: 3.6 10*3/uL (ref 1.7–7.7)
Neutrophils Relative %: 56 % (ref 43–77)

## 2011-08-12 LAB — D-DIMER, QUANTITATIVE (NOT AT ARMC): D-Dimer, Quant: 0.48 ug/mL-FEU (ref 0.00–0.48)

## 2011-08-12 LAB — PROTIME-INR
INR: 0.97 (ref 0.00–1.49)
Prothrombin Time: 13.1 seconds (ref 11.6–15.2)

## 2011-08-12 MED ORDER — METOPROLOL TARTRATE 50 MG PO TABS: 50.0000 mg | ORAL_TABLET | Freq: Two times a day (BID) | ORAL | Status: DC

## 2011-08-12 MED ORDER — PAROXETINE HCL 20 MG PO TABS
10.0000 mg | ORAL_TABLET | Freq: Every day | ORAL | Status: DC
  Administered 2011-08-12: 10 mg via ORAL
  Filled 2011-08-12: qty 1

## 2011-08-12 MED ORDER — HEPARIN SOD (PORCINE) IN D5W 100 UNIT/ML IV SOLN
INTRAVENOUS | Status: AC
  Administered 2011-08-12: 1000 [IU]/h
  Filled 2011-08-12: qty 250

## 2011-08-12 MED ORDER — HEPARIN SOD (PORCINE) IN D5W 100 UNIT/ML IV SOLN
1000.0000 [IU]/h | INTRAVENOUS | Status: DC
  Administered 2011-08-12: 1000 [IU]/h via INTRAVENOUS

## 2011-08-12 MED ORDER — OMEGA-3-ACID ETHYL ESTERS 1 G PO CAPS
1.0000 g | ORAL_CAPSULE | Freq: Every day | ORAL | Status: DC
  Administered 2011-08-13: 1 g via ORAL
  Filled 2011-08-12: qty 1

## 2011-08-12 MED ORDER — LATANOPROST 0.005 % OP SOLN
1.0000 [drp] | Freq: Every day | OPHTHALMIC | Status: DC
  Filled 2011-08-12: qty 2.5

## 2011-08-12 MED ORDER — FAMOTIDINE 20 MG PO TABS
40.0000 mg | ORAL_TABLET | Freq: Two times a day (BID) | ORAL | Status: DC
  Administered 2011-08-12 – 2011-08-13 (×2): 40 mg via ORAL
  Filled 2011-08-12 (×2): qty 2

## 2011-08-12 MED ORDER — SODIUM CHLORIDE 0.9 % IV SOLN
INTRAVENOUS | Status: DC
  Administered 2011-08-12: 900 mL via INTRAVENOUS

## 2011-08-12 MED ORDER — NITROGLYCERIN IN D5W 200-5 MCG/ML-% IV SOLN
5.0000 ug/min | INTRAVENOUS | Status: DC
  Administered 2011-08-12: 5 ug/min via INTRAVENOUS
  Filled 2011-08-12: qty 250

## 2011-08-12 MED ORDER — CLOPIDOGREL BISULFATE 75 MG PO TABS
75.0000 mg | ORAL_TABLET | Freq: Every day | ORAL | Status: DC
  Administered 2011-08-12: 75 mg via ORAL
  Filled 2011-08-12: qty 1

## 2011-08-12 NOTE — ED Provider Notes (Signed)
History  Scribed for Kenneth Octave, MD, the patient was seen in [facility]. The chart was scribed by Kenneth [name]. The patients care was started at 5:15 PM.   CSN: [id number]  Arrival date & time 08/12/11  1704   First MD Initiated Contact with Patient 08/12/11 1712      Chief Complaint  Patient presents with  . Chest Pain    (Consider location/radiation/quality/duration/timing/severity/associated sxs/prior treatment) HPI Kenneth [name] is a 71 y.o. male who presents to the Emergency Department complaining of sudden left sided chest pain lasting few minutes. States symptoms were severe and felt as if someone was stabbing him in chest. No alleviating or exacerbating factors.  Also noted pressure in chest, chills, SOB (chronic) and headache. States that symptoms have now subsided. Pt has never had similar symptoms. Denies any abdominal pain, cough or fever. Pt denies taking any nitroglycerin but reports taking four ASA. Reports last stress test being years ago. Denies any history of heart attack. There are no other associated symptoms and no other alleviating or aggravating factors.    Cardiologist: Dietrich Pates PCP: Dr. Ouida Sills     Past Medical History  Diagnosis Date  . Hypertension   . Hyperlipidemia   . Stroke   . ASCVD (arteriosclerotic cardiovascular disease)   . Laryngeal carcinoma   . GERD (gastroesophageal reflux disease)   . Nephrolithiasis   . Acute renal failure   . Abnormal chest x-ray   . Tobacco abuse   . Anxiety     Past Surgical History  Procedure Date  . Partial laryngectomy 1998  . Cholecystectomy 2007  . Cardiac surgery     Family History  Problem Relation Age of Onset  . Dementia Mother   . Heart disease Father     History  Substance Use Topics  . Smoking status: Former Games developer  . Smokeless tobacco: Not on file  . Alcohol Use: No      Review of Systems  Constitutional: Positive for chills. Negative for fever.  Respiratory: Positive for  shortness of breath. Negative for cough.   Cardiovascular: Positive for chest pain.  Gastrointestinal: Negative for anal bleeding.  Skin: Negative for rash.  Neurological: Positive for headaches.  All other systems reviewed and are negative.    Allergies  Erythromycin; Lorazepam; Penicillins; and Sulfonamide derivatives  Home Medications   Current Outpatient Rx  Name Route Sig Dispense Refill  . CLOPIDOGREL BISULFATE 75 MG PO TABS Oral Take 75 mg by mouth daily.      Marland Kitchen FAMOTIDINE 40 MG PO TABS Oral Take 40 mg by mouth 2 (two) times daily.      Marland Kitchen METOPROLOL TARTRATE 50 MG PO TABS Oral Take 25 mg by mouth 2 (two) times daily.      Marland Kitchen NIACIN ER (ANTIHYPERLIPIDEMIC) 500 MG PO TBCR Oral Take 500 mg by mouth as directed.      Marland Kitchen PAROXETINE HCL 10 MG PO TABS Oral Take 10 mg by mouth every morning.      Marland Kitchen PRAVASTATIN SODIUM 20 MG PO TABS Oral Take 20 mg by mouth daily.      Marland Kitchen RANITIDINE HCL 300 MG PO CAPS Oral Take 300 mg by mouth every evening.        BP 147/75  Pulse 52  Temp(Src) 98.4 F (36.9 C) (Oral)  Resp 16  Ht 5\' 6"  (1.676 m)  Wt 180 lb (81.647 kg)  BMI 29.05 kg/m2  SpO2 100%  Physical Exam  Constitutional: He is oriented to person, place,  and time. He appears well-developed and well-nourished.  Non-toxic appearance. He does not have a sickly appearance.  HENT:  Head: Normocephalic and atraumatic.  Eyes: Conjunctivae, EOM and lids are normal. Pupils are equal, round, and reactive to light.  Neck: Trachea normal, normal range of motion and full passive range of motion without pain. Neck supple.  Cardiovascular: Regular rhythm and normal heart sounds.   Pulmonary/Chest: Effort normal and breath sounds normal. No respiratory distress.       Well healed midline sternotomy  Abdominal: Soft. Normal appearance. He exhibits no distension. There is no tenderness. There is no rebound and no CVA tenderness.  Musculoskeletal: Normal range of motion.  Neurological: He is alert and  oriented to person, place, and time. He has normal strength.  Skin: Skin is warm, dry and intact. No rash noted.    ED Course  Procedures (including critical care time)  Labs Reviewed - No data to display No results found.   No diagnosis found.   DIAGNOSTIC STUDIES: Oxygen Saturation is 100% on room air, normal by my interpretation.     Date: 08/12/2011  Rate: 52  Rhythm: sinus bradycardia  QRS Axis: normal  Intervals: normal  ST/T Wave abnormalities: normal  Conduction Disutrbances:none  Narrative Interpretation:   Old EKG Reviewed: unchanged  Radiology: DG Chest 2 View. Reviewed by me. IMPRESSION: Scattered pulmonary parenchymal scarring. No acute findings. Original Report Authenticated By: [name], M.D.  LABS Results for orders placed during the hospital encounter of 08/12/11  CBC      Component Value Range   WBC 6.4  4.0 - 10.5 (K/uL)   RBC 4.59  4.22 - 5.81 (MIL/uL)   Hemoglobin 14.6  13.0 - 17.0 (g/dL)   HCT 16.1  09.6 - 04.5 (%)   MCV 94.8  78.0 - 100.0 (fL)   MCH 31.8  26.0 - 34.0 (pg)   MCHC 33.6  30.0 - 36.0 (g/dL)   RDW 40.9  81.1 - 91.4 (%)   Platelets 189  150 - 400 (K/uL)  DIFFERENTIAL      Component Value Range   Neutrophils Relative 56  43 - 77 (%)   Neutro Abs 3.6  1.7 - 7.7 (K/uL)   Lymphocytes Relative 34  12 - 46 (%)   Lymphs Abs 2.2  0.7 - 4.0 (K/uL)   Monocytes Relative 7  3 - 12 (%)   Monocytes Absolute 0.5  0.1 - 1.0 (K/uL)   Eosinophils Relative 2  0 - 5 (%)   Eosinophils Absolute 0.1  0.0 - 0.7 (K/uL)   Basophils Relative 0  0 - 1 (%)   Basophils Absolute 0.0  0.0 - 0.1 (K/uL)  COMPREHENSIVE METABOLIC PANEL      Component Value Range   Sodium 140  135 - 145 (mEq/L)   Potassium 4.2  3.5 - 5.1 (mEq/L)   Chloride 102  96 - 112 (mEq/L)   CO2 31  19 - 32 (mEq/L)   Glucose, Bld 93  70 - 99 (mg/dL)   BUN 12  6 - 23 (mg/dL)   Creatinine, Ser 7.82  0.50 - 1.35 (mg/dL)   Calcium 9.7  8.4 - 95.6 (mg/dL)   Total Protein 7.0   6.0 - 8.3 (g/dL)   Albumin 3.6  3.5 - 5.2 (g/dL)   AST 18  0 - 37 (U/L)   ALT 22  0 - 53 (U/L)   Alkaline Phosphatase 111  39 - 117 (U/L)   Total Bilirubin 0.3  0.3 - 1.2 (mg/dL)   GFR calc non Af Amer 63 (*) >90 (mL/min)   GFR calc Af Amer 73 (*) >90 (mL/min)  PROTIME-INR      Component Value Range   Prothrombin Time 13.1  11.6 - 15.2 (seconds)   INR 0.97  0.00 - 1.49   CARDIAC PANEL(CRET KIN+CKTOT+MB+TROPI)      Component Value Range   Total CK 66  7 - 232 (U/L)   CK, MB 1.8  0.3 - 4.0 (ng/mL)   Troponin I <0.30  <0.30 (ng/mL)   Relative Index RELATIVE INDEX IS INVALID  0.0 - 2.5      COORDINATION OF CARE: 5:15pm:  - Patient evaluated by ED physician, DG Chest, CBC, Diff, CMP, Protime-INR, Cardiac panel, EKG ordered 6:32pm: Recheck by EDP. Labs and Radiology results reviewed with patient and family. Pt still reports intermittent pain.  7:16pm: Recheck by EDP. Plan for admit discussed.     MDM  Sharp left-sided chest pain and pressure that lasted for a couple minutes.  This has occurred 3 times total. No shortness of breath, nausea or diaphoresis. He should with history of bypass remotely last stress test in 2009 last catheterization in 2007 showed patent grafts.  Patient does have some atypical features of his chest pain is not classic for angina. Is sharp stabbing pain that lasts a few minutes at a time as is recurred about 4-5 in ED. No EKG changes, troponin negative.  I discussed with Dr. Daleen Squibb of [facility].  He agrees symptoms not classic for angina.  PAtient has not had stress since 2009, no cath since 2007.  Patient never had chest pain like this before but his symptoms are not typical for angina. Dr. Daleen Squibb believes patient can stay at [facility] for serial enzymes.  I personally performed the services described in this documentation, which was scribed in my presence.  The recorded information has been reviewed and considered.  CRITICAL CARE Performed by: Kenneth [name]   Total critical care time: 30  Critical care time was exclusive of separately billable procedures and treating other patients.  Critical care was necessary to treat or prevent imminent or life-threatening deterioration.  Critical care was time spent personally by me on the following activities: development of treatment plan with patient and/or surrogate as well as nursing, discussions with consultants, evaluation of patient's response to treatment, examination of patient, obtaining history from patient or surrogate, ordering and performing treatments and interventions, ordering and review of laboratory studies, ordering and review of radiographic studies, pulse oximetry and re-evaluation of patient's condition.      Kenneth Octave, MD 08/12/11 [phone]

## 2011-08-12 NOTE — ED Notes (Signed)
Pt c/o pressure and sharp pain in his left chest that lasted for a couple minutes. Pt states that it has happened x 2 more times. Denies shortness of breath, nausea or diaphoresis. Alert and oriented x 3. Skin warm and dry. Color pink.

## 2011-08-12 NOTE — Progress Notes (Signed)
ANTICOAGULATION CONSULT NOTE - Initial Consult  Pharmacy Consult for Heparin Indication: chest pain/ACS  Allergies  Allergen Reactions  . Erythromycin   . Lorazepam   . Penicillins   . Statins   . Sulfonamide Derivatives     Patient Measurements: Height: 5\' 6"  (167.6 cm) Weight: 180 lb 5.4 oz (81.8 kg) IBW/kg (Calculated) : 63.8 kg  Vital Signs: Temp: 98.1 F (36.7 C) (01/17 2239) Temp src: Oral (01/17 2239) BP: 145/78 mmHg (01/17 2239) Pulse Rate: 46  (01/17 2239)  Labs:  Basename 08/12/11 1730  HGB 14.6  HCT 43.5  PLT 189  APTT --  LABPROT 13.1  INR 0.97  HEPARINUNFRC --  CREATININE 1.13  CKTOTAL 66  CKMB 1.8  TROPONINI <0.30   Estimated Creatinine Clearance: 59.3 ml/min (by C-G formula based on Cr of 1.13).  Medical History: Past Medical History  Diagnosis Date  . Hypertension   . Hyperlipidemia   . Stroke   . ASCVD (arteriosclerotic cardiovascular disease)   . Laryngeal carcinoma   . GERD (gastroesophageal reflux disease)   . Nephrolithiasis   . Acute renal failure   . Abnormal chest x-ray   . Tobacco abuse   . Anxiety     Medications:  Scheduled:    . clopidogrel  75 mg Oral QHS  . famotidine  40 mg Oral BID  . heparin      . latanoprost  1 drop Left Eye QHS  . metoprolol tartrate  50 mg Oral BID  . omega-3 acid ethyl esters  1 g Oral Daily  . PARoxetine  10 mg Oral QHS    Assessment: Ok for protocol  Goal of Therapy:  Heparin level 0.3-0.7 units/ml   Plan:  Heparin 3000 unit bolus, then 1000 Units/hr infusion Monitor heparin level and adjust accordingly   Raquel James, Nikiah Goin Bennett 08/12/2011,10:43 PM

## 2011-08-13 ENCOUNTER — Encounter (HOSPITAL_COMMUNITY): Payer: Self-pay | Admitting: Adult Health

## 2011-08-13 DIAGNOSIS — I2510 Atherosclerotic heart disease of native coronary artery without angina pectoris: Secondary | ICD-10-CM | POA: Diagnosis present

## 2011-08-13 DIAGNOSIS — R079 Chest pain, unspecified: Secondary | ICD-10-CM

## 2011-08-13 LAB — LIPID PANEL
Cholesterol: 181 mg/dL (ref 0–200)
HDL: 35 mg/dL — ABNORMAL LOW (ref >39)
LDL Cholesterol: 99 mg/dL (ref 0–99)
Total CHOL/HDL Ratio: 5.2 RATIO
Triglycerides: 236 mg/dL — ABNORMAL HIGH (ref <150)
VLDL: 47 mg/dL — ABNORMAL HIGH (ref 0–40)

## 2011-08-13 LAB — HEPARIN LEVEL (UNFRACTIONATED): Heparin Unfractionated: 0.55 IU/mL (ref 0.30–0.70)

## 2011-08-13 LAB — CARDIAC PANEL(CRET KIN+CKTOT+MB+TROPI)
CK, MB: 1.8 ng/mL (ref 0.3–4.0)
Relative Index: INVALID (ref 0.0–2.5)
Total CK: 57 U/L (ref 7–232)
Troponin I: <0.3 ng/mL (ref <0.30)

## 2011-08-13 MED ORDER — NITROGLYCERIN 0.4 MG SL SUBL: 0.4000 mg | SUBLINGUAL_TABLET | SUBLINGUAL | Status: DC | PRN

## 2011-08-13 MED ORDER — METOPROLOL TARTRATE 25 MG PO TABS: 25.0000 mg | ORAL_TABLET | Freq: Two times a day (BID) | ORAL | Status: DC

## 2011-08-13 NOTE — Progress Notes (Signed)
Pt discharged home via family; Pt and family given and explained all discharge instructions, carenotes, and prescriptions; pt and family stated understanding and denied questions/concerns; all f/u appointments in place; IV removed without complicaitons; pt stable at time of discharge; pt scheduled for outpatient stress test that is set up and MD aware

## 2011-08-13 NOTE — H&P (Signed)
NAME:  Kenneth [name], JESUS NO.:  [id number]  MEDICAL RECORD NO.:  [id number]  LOCATION:  A324                          FACILITY:  [facility]  PHYSICIAN:  Kingsley Callander. Ouida Sills, MD       DATE OF BIRTH:  1940/11/22  DATE OF ADMISSION:  08/12/2011 DATE OF DISCHARGE:  01/18/2013LH                             HISTORY & PHYSICAL   CHIEF COMPLAINT:  Chest pain.  HISTORY OF PRESENT ILLNESS:  This patient is a 71 year old white male with history of coronary artery disease, who presented to the emergency room with chest pain.  He stated that he was sitting on the sofa when he suddenly felt excruciating pain in his left chest.  He then continued to experience a tight feeling for approximately an hour.  He called paramedics who transported him to the emergency room.  He was started on IV nitroglycerin and IV heparin.  He did not experience shortness of breath, diaphoresis, or vomiting.  He states he was administered aspirin, but not sublingual nitroglycerin.  He has a history of bypass surgery of 3 vessels in 2003.  He subsequently had cardiac catheterization in 2007, and had no progression of disease.  He has a history of hyperlipidemia treated with no statin because of statin intolerance.  His last lipid panel revealed a cholesterol of 214 with an HDL of 31, triglycerides of 263, and LDL of 130 on June 29, 2011.  He does not have diabetes nor does he have hypertension. He does not smoke. His initial electrocardiogram revealed no evidence of acute ischemia and his initial cardiac enzymes were unremarkable.  He was hospitalized for additional evaluation.  PAST MEDICAL HISTORY: 1. Coronary artery disease status post bypass surgery. 2. Strokes. 3. Laryngeal cancer. 4. Hyperlipidemia. 5. Anxiety. 6. GERD. 7. Kidney stones. 8. Bradycardia. 9. Laparoscopic cholecystectomy. 10.Lung scar. 11.Erectile dysfunction.  MEDICATIONS: 1. Metoprolol 25 mg b.i.d. 2. Plavix 75 mg daily. 3.  Pepcid 40 mg b.i.d. 4. Paxil 10 mg daily. 5. Xalatan at bedtime. 6. Pred Forte.  ALLERGIES:  PENICILLIN.  He is also intolerant to SULFA and ERYTHROMYCIN.  FAMILY HISTORY:  His father died of an MI at 77 and had hypertension. Two brothers have had hypertension.  His mother died of Alzheimer disease in her early 47s.  Two sisters have had dementia.  Brother had prostate cancer.  SOCIAL HISTORY:  He does not smoke, drink, or use drugs.  REVIEW OF SYSTEMS:  Noncontributory.  PHYSICAL EXAMINATION:  VITAL SIGNS:  Afebrile, pulse 52, blood pressure 116/78, respirations 14.  GENERAL:  Alert and in no distress. HEENT:  Eyes, nose, and pharynx are unremarkable.  His voice is raspy. Hearing is diminished. NECK:  Reveals no JVD or thyromegaly. LUNGS:  Clear. HEART:  Regular, bradycardic with no murmurs.  Chest wall is nontender. ABDOMEN:  Nontender with no hepatosplenomegaly. EXTREMITIES:  No clubbing or edema. NEUROLOGIC:  At baseline. LYMPH NODES:  No cervical or supraclavicular enlargement.  LABORATORY DATA:  CBC and chemistries are unremarkable.  Troponins are negative x3.  EKG reveals sinus bradycardia with no acute ischemic changes.  Chest x-ray reveals some scarring with no acute infiltrate.  IMPRESSION AND PLAN: 1. Chest pain.  Initial EKG and cardiac enzymes revealed no evidence     of acute infarct.  He has been treated with heparin and     nitroglycerin.  He has had some difficulties with his IV at the     time of my evaluation.  Heparin and nitroglycerin will be     discontinued for now.  Cardiology consultation will be obtained.     It appears his last stress test was negative in 2009. 2. Strokes.  Continue Plavix. 3. History of laryngeal cancer.  He has had no sign of recurrence. 4. Hyperlipidemia. 5. Anxiety.  Continue paroxetine. 6. Gastroesophageal reflux disease.  Continue famotidine.     Kingsley Callander. Ouida Sills, MD     ROF/MEDQ  D:  08/13/2011  T:  08/13/2011  Job:   [id number]

## 2011-08-13 NOTE — Consult Note (Signed)
CARDIOLOGY CONSULT NOTE  Patient ID: Kenneth [name] MRN: [id number] DOB/AGE: July 18, 1941 71 y.o.  Admit date: 08/12/2011 Referring Physician: Ouida Sills Primary Physician: Ouida Sills Primary Cardiologist:Alston Berrie Reason for Consultation: Chest pain  Active Problems:  CAD in native artery  HYPERLIPIDEMIA  GASTROESOPHAGEAL REFLUX DISEASE  HPI: Kenneth [name] is a 71 year old patient of Dr. Dietrich Pates, with known history of coronary artery disease. The patient had coronary artery bypass grafting in 2003 with LIMA to LAD, first diagonal, and right internal mammary to the acute marginal. He had a followup cardiac catheterization in 2007 revealing patent grafts and an additional stress Myoview in 2009 which was negative for ischemia. He was last seen by Dr. Dietrich Pates in 2011 in the office for annual evaluation and was without complaint    The patient was in his usual state of health when he was sitting in history [place] watching the news when he had sudden onset of sharp left-sided chest pain with pressure, nonradiating. Lasting 2-3 minutes. He had no associated diaphoresis, nausea, or syncope. He did have some shortness of breath. However, he states shortness of breath is chronic for him. He call EMS and he was brought to ER for evaluation. He did not take any nitroglycerin as he does not have any at home. On arrival to the ER. The patient was found to have a blood pressure 147/75 with a heart rate of 52 beats per minute. He had normal lab, EKG, and chest x-ray. Cardiac enzymes were found to be negative x3. He has had recurrence of chest discomfort while he got up and walked in history of degenerative the bathroom lasting a few seconds and going away on its own.     On interview, the patient admits to increasing his activity at home to include walking on the treadmill at the [facility]. every other day along with walking on a treadmill at a senior center. He was concerned that he was gaining weight and wanted to lose  weight by increasing his activity. He was having no chest pain or significant shortness of breath during exercise. Currently, he is without complaint and resting comfortably. He states that when he had his initial cardiac discomfort. It was not related to chest pain, so much as it was weakness and shortness of breath. Just prior to having had his coronary artery bypass grafting. Since the surgery. He has been hospitalized 2-3 times with recurrent chest pain on and which were negative for cardiac etiology. He admits to a lot of anxiety concerning discomfort in his chest with his history.      Review of systems complete and found to be negative unless listed above   Past Medical History  Diagnosis Date  . Hypertension   . Hyperlipidemia   . Stroke   . ASCVD (arteriosclerotic cardiovascular disease)   . Laryngeal carcinoma   . GERD (gastroesophageal reflux disease)   . Nephrolithiasis   . Acute renal failure   . Abnormal chest x-ray   . Tobacco abuse   . Anxiety     Family History  Problem Relation Age of Onset  . Dementia Mother   . Heart disease Father     History   Social History  . Marital Status: Married    Spouse Name: N/A    Number of Children: N/A  . Years of Education: N/A   Occupational History  . Full time    Social History Main Topics  . Smoking status: Former Games developer  . Smokeless tobacco: Not on file  .  Alcohol Use: No  . Drug Use: No  . Sexually Active: Not on file   Other Topics Concern  . Not on file   Social History Narrative   Walks on the treadmill every other day at the [facility] for the last couple of weeks.    Past Surgical History  Procedure Date  . Partial laryngectomy 1998  . Cholecystectomy 2007  . Cardiac surgery   . Coronary artery bypass graft   . Eye surgery      Prescriptions prior to admission  Medication Sig Dispense Refill  . clopidogrel (PLAVIX) 75 MG tablet Take 75 mg by mouth at bedtime.       . famotidine (PEPCID) 40 MG tablet  Take 40 mg by mouth 2 (two) times daily.        . fish oil-omega-3 fatty acids 1000 MG capsule Take 3 g by mouth daily.      Marland Kitchen latanoprost (XALATAN) 0.005 % ophthalmic solution Place 1 drop into both eyes at bedtime.      . metoprolol (LOPRESSOR) 50 MG tablet Take 25 mg by mouth 2 (two) times daily.        Marland Kitchen PARoxetine (PAXIL) 10 MG tablet Take 10 mg by mouth at bedtime.       . prednisoLONE acetate (PRED FORTE) 1 % ophthalmic suspension Place 1 drop into the right eye as directed.      . ranitidine (ZANTAC) 300 MG capsule Take 300 mg by mouth every evening.          Physical Exam: Blood pressure 123/75, pulse 49, temperature 97.5 F (36.4 C), temperature source Oral, resp. rate 20, height 5\' 6"  (1.676 m), weight 180 lb 5.4 oz (81.8 kg), SpO2 96.00%.  General: Well developed, well nourished, in no acute distress Head: Eyes PERRLA, No xanthomas.   Normal cephalic and atramatic  Lungs: Clear bilaterally to auscultation and percussion. Heart: HRRR S1 S2, without MRG.  Pulses are 2+ & equal.            No carotid bruit. No JVD.  No abdominal bruits. No femoral bruits. Abdomen: Bowel sounds are positive, abdomen soft and non-tender without masses or                  Hernia's noted. Msk:  Back normal, normal gait. Normal strength and tone for age. Extremities: No clubbing, cyanosis or edema.  DP +1 Neuro: Alert and oriented X 3. Very hard of hearing. Psych:  Good affect, responds appropriately  Labs:   Lab Results  Component Value Date   WBC 6.4 08/12/2011   HGB 14.6 08/12/2011   HCT 43.5 08/12/2011   MCV 94.8 08/12/2011   PLT 189 08/12/2011     Lab 08/12/11 1730  NA 140  K 4.2  CL 102  CO2 31  BUN 12  CREATININE 1.13  CALCIUM 9.7  PROT 7.0  BILITOT 0.3  ALKPHOS 111  ALT 22  AST 18  GLUCOSE 93   Lab Results  Component Value Date   CKTOTAL 57 08/13/2011   CKMB 1.8 08/13/2011   TROPONINI <0.30 08/13/2011    Lab Results  Component Value Date   CHOL 185 06/01/2010   CHOL 178  02/10/2010   CHOL 172 05/23/2009   Lab Results  Component Value Date   HDL 31 06/01/2010   HDL 31* 02/10/2010   HDL 33 05/23/2009   Lab Results  Component Value Date   LDLCALC 101 06/01/2010   LDLCALC 78 02/10/2010  LDLCALC 82 05/23/2009   Lab Results  Component Value Date   TRIG 265 06/01/2010   TRIG 344* 02/10/2010   TRIG 286 05/23/2009   Lab Results  Component Value Date   CHOLHDL 5.7 Ratio 02/10/2010    Cardiac Cath  05/2006 totaled LAD and a 90% left main. There  was an 80% stenosis to the OM. The LIMA to the LAD was patent, but there  was a small kink where it was tethered to the chest wall, but this did not  appear to be flow limiting. The RIMA to the OM was patent. Dr. [name]  evaluated the films and felt that there was a possible area of ischemia in  the small OM2 and the small diagonal.  Stress Myoview: 2009 Myoview stress test which  revealed no evidence of ischemia.    Radiology: Dg Chest 2 View  08/12/2011  *RADIOLOGY REPORT*  Clinical Data: Left chest pain.  CHEST - 2 VIEW  Comparison: 04/09/2008.  Findings: Trachea is midline.  Heart size stable.  Lungs are low in volume with scattered scarring bilaterally.  No pleural fluid. Degenerative changes are seen in the spine.  IMPRESSION: Scattered pulmonary parenchymal scarring.  No acute findings.  Original Report Authenticated By: [name], M.D.   ZOX:WRUEA bradycardia rate of 48 bpm. No evidence of ischemia  ASSESSMENT AND PLAN:   1. Chest pain: He describes this as left-sided, sharp chest pain, with occasional pressure, lasting 2-3 minutes. It was unlike any prior cardiac pain that he had in the past. Actually, he never did have cardiac chest pain prior to CABG it was more shortness of breath and weakness. He has had a cardiac catheterization in 2007 revealing patent grafts. A followup stress Myoview 2009 with no evidence of ischemia. Cardiac enzymes and EKG are normal. This pain appears to be atypical  for cardiac pain. On this assessment. We will need to follow up in our office and would consider repeating stress Myoview at that time. He will be given prescription for nitroglycerin. On discharge.  2. Bradycardia: Heart rate since admission have been in the 40s and 50s with no significant pauses or heart block. Review of Dr. Dietrich Pates note on last visit revealed that his heart rate was in the 50s. at that time. He is currently on metoprolol 50 mg twice a day. On last office visit. He was on 25 mg twice a day Would decrease back to 25 mg twice a day with parameters. He could certainly be having symptomatic bradycardia, but he denies any dizziness or presyncope. We will continue to monitor his telemetry with reduction of beta blocker.  3. Hypercholesterolemia: Only see that he is on fish oil daily. He had been on pravastatin on last office visit. We will check lipids and LFTs for continued restraint medication. Signed: Bettey Mare. Lyman Bishop NP [facility]   Cardiology Attending Patient interviewed and examined. Discussed with Joni Reining, NP.  Above note annotated and modified based upon my findings.  Single episode of chest discomfort with negative cardiac markers and EKGs.  While this certainly could represent an episode of ischemia, other and more benign etiologies are equally likely.  In the absence of continuing symptoms or evidence for a cardiac origin, he can be discharged with plans for a stress nuclear study early next week.  He should have a prescription of nitroglycerin to use if needed, but was cautioned to call or or to summon EMS if that occurs.  He has had  similar episodes over the years since cardiac surgery without demonstrable myocardial ischemia.  [place] Bing, MD 08/13/2011, 4:41 PM

## 2011-08-13 NOTE — Progress Notes (Signed)
ANTICOAGULATION CONSULT NOTE   Pharmacy Consult for Heparin Indication: chest pain/ACS  Allergies  Allergen Reactions  . Erythromycin   . Lorazepam   . Penicillins   . Statins   . Sulfonamide Derivatives    Patient Measurements: Height: 5\' 6"  (167.6 cm) Weight: 180 lb 5.4 oz (81.8 kg) IBW/kg (Calculated) : 63.8 kg  Vital Signs: Temp: 97.5 F (36.4 C) (01/18 0559) Temp src: Oral (01/18 0559) BP: 123/75 mmHg (01/18 0559) Pulse Rate: 49  (01/18 0559)  Labs:  Alvira Philips 08/13/11 0539 08/13/11 0538 08/12/11 2242 08/12/11 1730  HGB -- -- -- 14.6  HCT -- -- -- 43.5  PLT -- -- -- 189  APTT -- -- -- --  LABPROT -- -- -- 13.1  INR -- -- -- 0.97  HEPARINUNFRC -- 0.55 -- --  CREATININE -- -- -- 1.13  CKTOTAL 57 -- 62 66  CKMB 1.8 -- 1.8 1.8  TROPONINI <0.30 -- <0.30 <0.30   Estimated Creatinine Clearance: 59.3 ml/min (by C-G formula based on Cr of 1.13).  Medical History: Past Medical History  Diagnosis Date  . Hypertension   . Hyperlipidemia   . Stroke   . ASCVD (arteriosclerotic cardiovascular disease)   . Laryngeal carcinoma   . GERD (gastroesophageal reflux disease)   . Nephrolithiasis   . Acute renal failure   . Abnormal chest x-ray   . Tobacco abuse   . Anxiety    Medications:  Scheduled:     . clopidogrel  75 mg Oral QHS  . famotidine  40 mg Oral BID  . heparin      . latanoprost  1 drop Left Eye QHS  . metoprolol tartrate  50 mg Oral BID  . omega-3 acid ethyl esters  1 g Oral Daily  . PARoxetine  10 mg Oral QHS   Assessment: Heparin level therapeutic  Goal of Therapy:  Heparin level 0.3-0.7 units/ml   Plan:  Continue Heparin at 1000 Units/hr  Monitor heparin level and adjust accordingly CBC per protocol   Valrie Hart A 08/13/2011,7:39 AM

## 2011-08-13 NOTE — Progress Notes (Signed)
UR Chart Review Completed  

## 2011-08-13 NOTE — Discharge Summary (Signed)
NAME:  Kenneth [name], SELF NO.:  [id number]  MEDICAL RECORD NO.:  [id number]  LOCATION:  A324                          FACILITY:  [facility]  PHYSICIAN:  Kingsley Callander. Ouida Sills, MD       DATE OF BIRTH:  [birth year]  DATE OF ADMISSION:  08/12/2011 DATE OF DISCHARGE:  01/18/2013LH                              DISCHARGE SUMMARY   DISCHARGE DIAGNOSES: 1. Chest pain, myocardial infarction ruled out. 2. Coronary artery disease status post bypass surgery. 3. Strokes. 4. Laryngeal cancer. 5. Hyperlipidemia. 6. Anxiety. 7. Gastroesophageal reflux disease. 8. Kidney stones. 9. Bradycardia.  HOSPITAL COURSE:  This patient is a 71 year old male who presented with left chest pain.  His initial EKG revealed no acute ischemia.  His initial cardiac markers were negative.  He was hospitalized in a monitored setting, and treated with heparin and IV nitroglycerin.  He had been administered aspirin.  He ruled out for an MI with negative troponins.  His repeat EKG revealed no evidence of acute ischemia.  He had no return of his pain after resolution after about an hour on the day of admission.  He was seen in Cardiology consultation by Dr. Dietrich Pates.  Arrangements were made for stress testing next week.  He was given a prescription for sublingual nitroglycerin.  CONDITION AT DISCHARGE:  Much improved.  DISCHARGE MEDICATIONS: 1. Sublingual nitroglycerin 0.4 mg q.5 minutes x3 p.r.n. 2. Plavix 75 mg daily. 3. Metoprolol 25 mg b.i.d. 4. Famotidine 40 mg b.i.d. 5. Paxil 10 mg daily. 6. Xalatan and Pred Forte as directed by his ophthalmologist.     Kingsley Callander. Ouida Sills, MD     ROF/MEDQ  D:  08/13/2011  T:  08/13/2011  Job:  [id number]

## 2011-08-16 ENCOUNTER — Ambulatory Visit (INDEPENDENT_AMBULATORY_CARE_PROVIDER_SITE_OTHER): Payer: PRIVATE HEALTH INSURANCE | Admitting: *Deleted

## 2011-08-16 ENCOUNTER — Encounter (HOSPITAL_COMMUNITY): Payer: Self-pay | Admitting: Cardiology

## 2011-08-16 ENCOUNTER — Encounter (HOSPITAL_COMMUNITY): Payer: Self-pay

## 2011-08-16 ENCOUNTER — Encounter (HOSPITAL_COMMUNITY)
Admission: RE | Admit: 2011-08-16 | Discharge: 2011-08-16 | Disposition: A | Discharge status: Home / Self Care | Payer: Medicare Other | Source: Ambulatory Visit | Attending: Cardiology | Admitting: Cardiology

## 2011-08-16 ENCOUNTER — Other Ambulatory Visit: Payer: Self-pay | Admitting: Cardiology

## 2011-08-16 DIAGNOSIS — R079 Chest pain, unspecified: Secondary | ICD-10-CM | POA: Insufficient documentation

## 2011-08-16 DIAGNOSIS — Z951 Presence of aortocoronary bypass graft: Secondary | ICD-10-CM | POA: Insufficient documentation

## 2011-08-16 DIAGNOSIS — I10 Essential (primary) hypertension: Secondary | ICD-10-CM | POA: Insufficient documentation

## 2011-08-16 DIAGNOSIS — E785 Hyperlipidemia, unspecified: Secondary | ICD-10-CM | POA: Insufficient documentation

## 2011-08-16 DIAGNOSIS — I2510 Atherosclerotic heart disease of native coronary artery without angina pectoris: Secondary | ICD-10-CM

## 2011-08-16 MED ORDER — TECHNETIUM TC 99M TETROFOSMIN IV KIT
30.0000 | PACK | Freq: Once | INTRAVENOUS | Status: AC | PRN
  Administered 2011-08-16: 31 via INTRAVENOUS

## 2011-08-16 MED ORDER — TECHNETIUM TC 99M TETROFOSMIN IV KIT
10.0000 | PACK | Freq: Once | INTRAVENOUS | Status: AC | PRN
  Administered 2011-08-16: 9.5 via INTRAVENOUS

## 2011-08-16 NOTE — Progress Notes (Signed)
Stress Lab Nurses Notes - [name]  CECILE GUEVARA 08/16/2011  Reason for doing test: CAD and Chest Pain Type of test: Lexiscan Myoview, Stress Myoview and Test Changed unable to reach THR, fatigue Nurse performing test: Parke Poisson, RN Nuclear Medicine Tech: Lou Cal Echo Tech: Not Applicable MD performing test: Ival Bible & Joni Reining NP Family MD: Lodema Hong Test explained and consent signed: yes IV started: 22g jelco, Saline lock flushed, No redness or edema and Saline lock started in radiology Symptoms: Fatigue and SOB Treatment/Intervention: None Reason test stopped: protocol completed After recovery IV was: Discontinued via X-ray tech and No redness or edema Patient to return to Nuc. Med at :11AM Patient discharged: Home Patient's Condition upon discharge was: stable Comments:  During test BP 168/78& HR 112.  Recovery BP 142/72 & HR 66.  Symptoms resolved in recovery. Erskine Speed T

## 2011-08-17 ENCOUNTER — Telehealth: Payer: Self-pay | Admitting: Cardiology

## 2011-08-17 NOTE — Telephone Encounter (Signed)
PT CALLING FOR TEST RESULTS DONE 08/16/11/TMJ

## 2011-10-07 ENCOUNTER — Telehealth: Payer: Self-pay | Admitting: Internal Medicine

## 2011-10-07 NOTE — Telephone Encounter (Signed)
Pt's wife called because patient is hard of hearing. She asked if nurse could call her back at [phone]

## 2011-10-07 NOTE — Telephone Encounter (Signed)
I returned pt's wife's call. She was giving the info. She will call back tomorrow with the complete medication info.

## 2011-10-12 ENCOUNTER — Telehealth: Payer: Self-pay

## 2011-10-12 ENCOUNTER — Other Ambulatory Visit: Payer: Self-pay

## 2011-10-12 DIAGNOSIS — Z139 Encounter for screening, unspecified: Secondary | ICD-10-CM

## 2011-10-12 NOTE — Telephone Encounter (Signed)
Gastroenterology Pre-Procedure Form   Request Date: 10/11/2011     Requesting Physician: Dr. Ouida Sills     PATIENT INFORMATION:  Kenneth [name] is a 71 y.o., male (DOB=10/07/1940).  PROCEDURE: Procedure(s) requested: colonoscopy Procedure Reason: screening for colon cancer  PATIENT REVIEW QUESTIONS: The patient reports the following:   1. Diabetes Melitis: no 2. Joint replacements in the past 12 months: no 3. Major health problems in the past 3 months: no 4. Has an artificial valve or MVP:no 5. Has been advised in past to take antibiotics in advance of a procedure like teeth cleaning: no}    MEDICATIONS & ALLERGIES:    Patient reports the following regarding taking any blood thinners:   Plavix? yes  Aspirin?no Coumadin?  no  Patient confirms/reports the following medications:  Current Outpatient Prescriptions  Medication Sig Dispense Refill  . clopidogrel (PLAVIX) 75 MG tablet Take 75 mg by mouth at bedtime.       . famotidine (PEPCID) 40 MG tablet Take 40 mg by mouth 2 (two) times daily.        . fish oil-omega-3 fatty acids 1000 MG capsule Take 3 g by mouth daily.      Marland Kitchen latanoprost (XALATAN) 0.005 % ophthalmic solution Place 1 drop into both eyes at bedtime.      . metoprolol (LOPRESSOR) 50 MG tablet Take 25 mg by mouth 2 (two) times daily.        . nitroGLYCERIN (NITROSTAT) 0.4 MG SL tablet Place 1 tablet (0.4 mg total) under the tongue every 5 (five) minutes as needed for chest pain.  25 tablet  12  . PARoxetine (PAXIL) 10 MG tablet Take 10 mg by mouth at bedtime.       . prednisoLONE acetate (PRED FORTE) 1 % ophthalmic suspension Place 1 drop into the right eye as directed.        Patient confirms/reports the following allergies:  Allergies  Allergen Reactions  . Erythromycin   . Lorazepam   . Penicillins   . Statins   . Sulfonamide Derivatives     Patient is appropriate to schedule for requested procedure(s): yes  AUTHORIZATION INFORMATION Primary Insurance:    ID #:  Group #:  Pre-Cert / Auth required:  Pre-Cert / Auth #:   Secondary Insurance: ,ID #:   Group #:  Pre-Cert / Auth required:  Pre-Cert / Auth #:   No orders of the defined types were placed in this encounter.    SCHEDULE INFORMATION: Procedure has been scheduled as follows:  Date 11/10/2011:           Time: 8:30 AM Location: [facility] Short Stay  This Gastroenterology Pre-Precedure Form is being routed to the following provider(s) for review: R. Roetta Sessions, MD

## 2011-10-13 NOTE — Telephone Encounter (Signed)
See 10/12/2011 telephone note triage.

## 2011-10-13 NOTE — Telephone Encounter (Signed)
OK 

## 2011-10-13 NOTE — Telephone Encounter (Signed)
Rx and instructions mailed to pt.  

## 2011-11-09 ENCOUNTER — Encounter (HOSPITAL_COMMUNITY): Payer: Self-pay | Admitting: Pharmacy Technician

## 2011-11-09 MED ORDER — SODIUM CHLORIDE 0.45 % IV SOLN
Freq: Once | INTRAVENOUS | Status: AC
  Administered 2011-11-10: 08:00:00 via INTRAVENOUS

## 2011-11-10 ENCOUNTER — Encounter (HOSPITAL_COMMUNITY): Payer: Self-pay | Admitting: *Deleted

## 2011-11-10 ENCOUNTER — Ambulatory Visit (HOSPITAL_COMMUNITY)
Admission: RE | Admit: 2011-11-10 | Discharge: 2011-11-10 | Disposition: A | Discharge status: Home Health | Payer: Medicare Other | Source: Ambulatory Visit | Attending: Internal Medicine | Admitting: Internal Medicine

## 2011-11-10 ENCOUNTER — Encounter (HOSPITAL_COMMUNITY)
Admission: RE | Disposition: A | Discharge status: Home Health | Payer: Self-pay | Source: Ambulatory Visit | Attending: Internal Medicine

## 2011-11-10 DIAGNOSIS — Z79899 Other long term (current) drug therapy: Secondary | ICD-10-CM | POA: Insufficient documentation

## 2011-11-10 DIAGNOSIS — Z1211 Encounter for screening for malignant neoplasm of colon: Secondary | ICD-10-CM

## 2011-11-10 DIAGNOSIS — K648 Other hemorrhoids: Secondary | ICD-10-CM

## 2011-11-10 DIAGNOSIS — K644 Residual hemorrhoidal skin tags: Secondary | ICD-10-CM | POA: Insufficient documentation

## 2011-11-10 DIAGNOSIS — I10 Essential (primary) hypertension: Secondary | ICD-10-CM | POA: Insufficient documentation

## 2011-11-10 DIAGNOSIS — Z139 Encounter for screening, unspecified: Secondary | ICD-10-CM

## 2011-11-10 DIAGNOSIS — D126 Benign neoplasm of colon, unspecified: Secondary | ICD-10-CM | POA: Insufficient documentation

## 2011-11-10 HISTORY — DX: Major depressive disorder, single episode, unspecified: F32.9

## 2011-11-10 HISTORY — DX: Depression, unspecified: F32.A

## 2011-11-10 HISTORY — PX: COLONOSCOPY: SHX5424

## 2011-11-10 HISTORY — DX: Pure hyperglyceridemia: E78.1

## 2011-11-10 SURGERY — COLONOSCOPY
Anesthesia: Moderate Sedation

## 2011-11-10 MED ORDER — MIDAZOLAM HCL 5 MG/5ML IJ SOLN
INTRAMUSCULAR | Status: DC | PRN
  Administered 2011-11-10: 1 mg via INTRAVENOUS
  Administered 2011-11-10: 2 mg via INTRAVENOUS

## 2011-11-10 MED ORDER — MEPERIDINE HCL 100 MG/ML IJ SOLN
INTRAMUSCULAR | Status: DC | PRN
  Administered 2011-11-10: 50 mg via INTRAVENOUS
  Administered 2011-11-10: 25 mg via INTRAVENOUS

## 2011-11-10 MED ORDER — MEPERIDINE HCL 100 MG/ML IJ SOLN
INTRAMUSCULAR | Status: AC
  Filled 2011-11-10: qty 2

## 2011-11-10 MED ORDER — MIDAZOLAM HCL 5 MG/5ML IJ SOLN
INTRAMUSCULAR | Status: AC
  Filled 2011-11-10: qty 10

## 2011-11-10 NOTE — Discharge Instructions (Addendum)
Colonoscopy Discharge Instructions  Read the instructions outlined below and refer to this sheet in the next few weeks. These discharge instructions provide you with general information on caring for yourself after you leave the hospital. Your doctor may also give you specific instructions. While your treatment has been planned according to the most current medical practices available, unavoidable complications occasionally occur. If you have any problems or questions after discharge, call Dr. Rourk at [phone]. ACTIVITY  You may resume your regular activity, but move at a slower pace for the next 24 hours.   Take frequent rest periods for the next 24 hours.   Walking will help get rid of the air and reduce the bloated feeling in your belly (abdomen).   No driving for 24 hours (because of the medicine (anesthesia) used during the test).    Do not sign any important legal documents or operate any machinery for 24 hours (because of the anesthesia used during the test).  NUTRITION  Drink plenty of fluids.   You may resume your normal diet as instructed by your doctor.   Begin with a light meal and progress to your normal diet. Heavy or fried foods are harder to digest and may make you feel sick to your stomach (nauseated).   Avoid alcoholic beverages for 24 hours or as instructed.  MEDICATIONS  You may resume your normal medications unless your doctor tells you otherwise.  WHAT YOU CAN EXPECT TODAY  Some feelings of bloating in the abdomen.   Passage of more gas than usual.   Spotting of blood in your stool or on the toilet paper.  IF YOU HAD POLYPS REMOVED DURING THE COLONOSCOPY:  No aspirin products for 7 days or as instructed.   No alcohol for 7 days or as instructed.   Eat a soft diet for the next 24 hours.  FINDING OUT THE RESULTS OF YOUR TEST Not all test results are available during your visit. If your test results are not back during the visit, make an appointment  with your caregiver to find out the results. Do not assume everything is normal if you have not heard from your caregiver or the medical facility. It is important for you to follow up on all of your test results.  SEEK IMMEDIATE MEDICAL ATTENTION IF:  You have more than a spotting of blood in your stool.   Your belly is swollen (abdominal distention).   You are nauseated or vomiting.   You have a temperature over 101.   You have abdominal pain or discomfort that is severe or gets worse throughout the day.    Polyp information provided.  Further recommendations to follow pending review of pathology report.  Colon Polyps A polyp is extra tissue that grows inside your body. Colon polyps grow in the large intestine. The large intestine, also called the colon, is part of your digestive system. It is a long, hollow tube at the end of your digestive tract where your body makes and stores stool. Most polyps are not dangerous. They are benign. This means they are not cancerous. But over time, some types of polyps can turn into cancer. Polyps that are smaller than a pea are usually not harmful. But larger polyps could someday become or may already be cancerous. To be safe, doctors remove all polyps and test them.  WHO GETS POLYPS? Anyone can get polyps, but certain people are more likely than others. You may have a greater chance of getting polyps if:    You are over 50.   You have had polyps before.   Someone in your family has had polyps.   Someone in your family has had cancer of the large intestine.   Find out if someone in your family has had polyps. You may also be more likely to get polyps if you:   Eat a lot of fatty foods.   Smoke.   Drink alcohol.   Do not exercise.   Eat too much.  SYMPTOMS  Most small polyps do not cause symptoms. People often do not know they have one until their caregiver finds it during a regular checkup or while testing them for something else. Some  people do have symptoms like these:  Bleeding from the anus. You might notice blood on your underwear or on toilet paper after you have had a bowel movement.   Constipation or diarrhea that lasts more than a week.   Blood in the stool. Blood can make stool look black or it can show up as red streaks in the stool.  If you have any of these symptoms, see your caregiver. HOW DOES THE DOCTOR TEST FOR POLYPS? The doctor can use four tests to check for polyps:  Digital rectal exam. The caregiver wears gloves and checks your rectum (the last part of the large intestine) to see if it feels normal. This test would find polyps only in the rectum. Your caregiver may need to do one of the other tests listed below to find polyps higher up in the intestine.   Barium enema. The caregiver puts a liquid called barium into your rectum before taking x-rays of your large intestine. Barium makes your intestine look white in the pictures. Polyps are dark, so they are easy to see.   Sigmoidoscopy. With this test, the caregiver can see inside your large intestine. A thin flexible tube is placed into your rectum. The device is called a sigmoidoscope, which has a light and a tiny video camera in it. The caregiver uses the sigmoidoscope to look at the last third of your large intestine.   Colonoscopy. This test is like sigmoidoscopy, but the caregiver looks at all of the large intestine. It usually requires sedation. This is the most common method for finding and removing polyps.  TREATMENT   The caregiver will remove the polyp during sigmoidoscopy or colonoscopy. The polyp is then tested for cancer.   If you have had polyps, your caregiver may want you to get tested regularly in the future.  PREVENTION  There is not one sure way to prevent polyps. You might be able to lower your risk of getting them if you:  Eat more fruits and vegetables and less fatty food.   Do not smoke.   Avoid alcohol.   Exercise every  day.   Lose weight if you are overweight.   Eating more calcium and folate can also lower your risk of getting polyps. Some foods that are rich in calcium are milk, cheese, and broccoli. Some foods that are rich in folate are chickpeas, kidney beans, and spinach.   Aspirin might help prevent polyps. Studies are under way.  Document Released: 04/07/2004 Document Revised: 07/01/2011 Document Reviewed: 09/13/2007 [employer] Patient Information 2012 [employer]. 

## 2011-11-10 NOTE — H&P (Signed)
Primary Care Physician:  [name], MD, MD Primary Gastroenterologist:  Dr. Jena Gauss  Pre-Procedure History & Physical: HPI:  Kenneth [name] is a 71 y.o. male is here for a screening colonoscopy. Last exam 10 years ago. 63 year old brother and her report have multiple colonic polyps. Aside from chronic constipation, patient denies any GI symptoms.  Past Medical History  Diagnosis Date  . Hypertension   . Stroke   . ASCVD (arteriosclerotic cardiovascular disease)   . Laryngeal carcinoma   . GERD (gastroesophageal reflux disease)   . Nephrolithiasis   . Acute renal failure   . Abnormal chest x-ray   . Tobacco abuse   . Anxiety   . Depression   . High triglycerides     Past Surgical History  Procedure Date  . Partial laryngectomy 1998  . Cholecystectomy 2007  . Cardiac surgery   . Coronary artery bypass graft   . Eye surgery     Prior to Admission medications   Medication Sig Start Date End Date Taking? Authorizing Provider  clopidogrel (PLAVIX) 75 MG tablet Take 75 mg by mouth at bedtime.    Yes Historical Provider, MD  famotidine (PEPCID) 40 MG tablet Take 40 mg by mouth 2 (two) times daily.     Yes Historical Provider, MD  latanoprost (XALATAN) 0.005 % ophthalmic solution Place 1 drop into both eyes at bedtime.   Yes Historical Provider, MD  metoprolol (LOPRESSOR) 50 MG tablet Take 25 mg by mouth 2 (two) times daily.     Yes Historical Provider, MD  PARoxetine (PAXIL) 10 MG tablet Take 10 mg by mouth at bedtime.    Yes Historical Provider, MD  prednisoLONE acetate (PRED FORTE) 1 % ophthalmic suspension Place 1 drop into the right eye 2 (two) times daily.    Yes Historical Provider, MD  fish oil-omega-3 fatty acids 1000 MG capsule Take 1 g by mouth 3 (three) times daily.     Historical Provider, MD  nitroGLYCERIN (NITROSTAT) 0.4 MG SL tablet Place 1 tablet (0.4 mg total) under the tongue every 5 (five) minutes as needed for chest pain. 08/13/11 08/12/12  [name], MD     Allergies as of 10/12/2011 - Review Complete 10/12/2011  Allergen Reaction Noted  . Erythromycin    . Lorazepam    . Penicillins    . Statins  08/12/2011  . Sulfonamide derivatives      Family History  Problem Relation Age of Onset  . Dementia Mother   . Heart disease Father   . Colon cancer Neg Hx     History   Social History  . Marital Status: Married    Spouse Name: N/A    Number of Children: N/A  . Years of Education: N/A   Occupational History  . Full time    Social History Main Topics  . Smoking status: Former Smoker -- 1.0 packs/day for 12 years    Types: Cigarettes  . Smokeless tobacco: Not on file  . Alcohol Use: No  . Drug Use: No  . Sexually Active: Not on file   Other Topics Concern  . Not on file   Social History Narrative   Walks on the treadmill every other day at the [facility] for the last couple of weeks.    Review of Systems: See HPI, otherwise negative ROS  Physical Exam: BP 136/61  Pulse 44  Temp(Src) 97.6 F (36.4 C) (Oral)  Resp 12  Ht 5\' 6"  (1.676 m)  Wt 180 lb (81.647 kg)  BMI 29.05 kg/m2  SpO2 100% General:   Alert,  Well-developed, well-nourished, pleasant and cooperative in NAD. Hoarse with low volume voice Head:  Normocephalic and atraumatic. Eyes:  Sclera clear, no icterus.   Conjunctiva pink. Ears:  Normal auditory acuity. Nose:  No deformity, discharge,  or lesions. Mouth:  No deformity or lesions, dentition normal. Neck:  Supple; no masses or thyromegaly. Lungs:  Clear throughout to auscultation.   No wheezes, crackles, or rhonchi. No acute distress. Heart:  Regular rate and rhythm; no murmurs, clicks, rubs,  or gallops. Abdomen:  Soft, nontender and nondistended. No masses, hepatosplenomegaly or hernias noted. Normal bowel sounds, without guarding, and without rebound.   Msk:  Symmetrical without gross deformities. Normal posture. Pulses:  Normal pulses noted. Extremities:  Without clubbing or edema. Neurologic:   Alert and  oriented x4;  grossly normal neurologically. Skin:  Intact without significant lesions or rashes. Cervical Nodes:  No significant cervical adenopathy. Psych:  Alert and cooperative. Normal mood and affect.  Impression/Plan: Kenneth [name] is now here to undergo a screening colonoscopy. Chronic constipation. Brother now with a history of colon polyps.  Risks, benefits, limitations, imponderables and alternatives regarding colonoscopy have been reviewed with the patient. Questions have been answered. All parties agreeable.

## 2011-11-10 NOTE — Op Note (Signed)
[facility] [address] [place], [place]  [address]  COLONOSCOPY PROCEDURE REPORT  PATIENT:  Kenneth [name], Kenneth [name]  MR#:  [id number] BIRTHDATE:  Oct 30, 1940, 70 yrs. old  GENDER:  male ENDOSCOPIST:  R. Roetta Sessions, MD FACP [facility] REF. BY:          [place] PROCEDURE DATE:  11/10/2011 PROCEDURE:  Colonoscopy with biopsy  INDICATIONS:  Colorectal cancer screening.  INFORMED CONSENT:  The risks, benefits, alternatives and imponderables including but not limited to bleeding, perforation as well as the possibility of a missed lesion have been reviewed. The potential for biopsy, lesion removal, etc. have also been discussed.  Questions have been answered.  All parties agreeable. Please see the history and physical in the medical record for more information.  MEDICATIONS:  Versed 3 mg IV Demerol 75 mg IV in divided doses  DESCRIPTION OF PROCEDURE:  After a digital rectal exam was performed, the EC-3890LI ([id number]) and EC-3890Li ([id number]) colonoscope was advanced from the anus through the rectum and colon to the area of the cecum, ileocecal valve and appendiceal orifice.  The cecum was deeply intubated.  These structures were well-seen and photographed for the record.  From the level of the cecum and ileocecal valve, the scope was slowly and cautiously withdrawn.  The mucosal surfaces were carefully surveyed utilizing scope tip deflection to facilitate fold flattening as needed.  The scope was pulled down into the rectum where a thorough examination including retroflexion was performed. <<PROCEDUREIMAGES>>  FINDINGS: Adequate preparation. Anal canal/external hemorrhoids; otherwise, normal rectum. Single diminutive polyp in the ascending colon just distal to the ileocecal valve; otherwise remainder of colonic mucosa appeared normal.  THERAPEUTIC / DIAGNOSTIC MANEUVERS PERFORMED:  The ascending colon polyp was cold biopsied/removed.  COMPLICATIONS:  None  CECAL WITHDRAWAL TIME:   8 minutes  IMPRESSION:   Anal canal hemorrhoids;  ascending colon polyp-removed as described above.  RECOMMENDATIONS: Followup will have pathology  ______________________________ R. Roetta Sessions, MD Caleen Essex  CC:  n. eSIGNED:   R. Roetta Sessions at 11/10/2011 09:16 AM  Kenneth [name], [id number]

## 2011-11-12 ENCOUNTER — Encounter (HOSPITAL_COMMUNITY): Payer: Self-pay | Admitting: Internal Medicine

## 2011-11-14 ENCOUNTER — Encounter: Payer: Self-pay | Admitting: Internal Medicine

## 2012-04-27 ENCOUNTER — Observation Stay (HOSPITAL_COMMUNITY): Payer: Medicare Other

## 2012-04-27 ENCOUNTER — Encounter (HOSPITAL_COMMUNITY): Payer: Self-pay | Admitting: *Deleted

## 2012-04-27 ENCOUNTER — Inpatient Hospital Stay (HOSPITAL_COMMUNITY)
Admission: EM | Admit: 2012-04-27 | Discharge: 2012-04-29 | DRG: 066 | Disposition: A | Discharge status: Home / Self Care | Payer: Medicare Other | Attending: Internal Medicine | Admitting: Internal Medicine

## 2012-04-27 ENCOUNTER — Emergency Department (HOSPITAL_COMMUNITY): Payer: Medicare Other

## 2012-04-27 DIAGNOSIS — Z951 Presence of aortocoronary bypass graft: Secondary | ICD-10-CM

## 2012-04-27 DIAGNOSIS — E785 Hyperlipidemia, unspecified: Secondary | ICD-10-CM | POA: Diagnosis present

## 2012-04-27 DIAGNOSIS — F411 Generalized anxiety disorder: Secondary | ICD-10-CM | POA: Diagnosis present

## 2012-04-27 DIAGNOSIS — Z7982 Long term (current) use of aspirin: Secondary | ICD-10-CM

## 2012-04-27 DIAGNOSIS — Z880 Allergy status to penicillin: Secondary | ICD-10-CM

## 2012-04-27 DIAGNOSIS — I10 Essential (primary) hypertension: Secondary | ICD-10-CM | POA: Diagnosis present

## 2012-04-27 DIAGNOSIS — Z882 Allergy status to sulfonamides status: Secondary | ICD-10-CM

## 2012-04-27 DIAGNOSIS — I2510 Atherosclerotic heart disease of native coronary artery without angina pectoris: Secondary | ICD-10-CM | POA: Diagnosis present

## 2012-04-27 DIAGNOSIS — K219 Gastro-esophageal reflux disease without esophagitis: Secondary | ICD-10-CM | POA: Diagnosis present

## 2012-04-27 DIAGNOSIS — I498 Other specified cardiac arrhythmias: Secondary | ICD-10-CM | POA: Diagnosis present

## 2012-04-27 DIAGNOSIS — Z8673 Personal history of transient ischemic attack (TIA), and cerebral infarction without residual deficits: Secondary | ICD-10-CM

## 2012-04-27 DIAGNOSIS — Z87891 Personal history of nicotine dependence: Secondary | ICD-10-CM

## 2012-04-27 DIAGNOSIS — Z8521 Personal history of malignant neoplasm of larynx: Secondary | ICD-10-CM

## 2012-04-27 DIAGNOSIS — I6350 Cerebral infarction due to unspecified occlusion or stenosis of unspecified cerebral artery: Principal | ICD-10-CM | POA: Diagnosis present

## 2012-04-27 DIAGNOSIS — I639 Cerebral infarction, unspecified: Secondary | ICD-10-CM

## 2012-04-27 HISTORY — DX: Atherosclerotic heart disease of native coronary artery without angina pectoris: I25.10

## 2012-04-27 LAB — CBC WITH DIFFERENTIAL/PLATELET
Basophils Absolute: 0 10*3/uL (ref 0.0–0.1)
Basophils Relative: 0 % (ref 0–1)
Eosinophils Absolute: 0.1 10*3/uL (ref 0.0–0.7)
Eosinophils Relative: 2 % (ref 0–5)
HCT: 43.2 % (ref 39.0–52.0)
Hemoglobin: 14.5 g/dL (ref 13.0–17.0)
Lymphocytes Relative: 37 % (ref 12–46)
Lymphs Abs: 2.4 10*3/uL (ref 0.7–4.0)
MCH: 32 pg (ref 26.0–34.0)
MCHC: 33.6 g/dL (ref 30.0–36.0)
MCV: 95.4 fL (ref 78.0–100.0)
Monocytes Absolute: 0.4 10*3/uL (ref 0.1–1.0)
Monocytes Relative: 6 % (ref 3–12)
Neutro Abs: 3.5 10*3/uL (ref 1.7–7.7)
Neutrophils Relative %: 55 % (ref 43–77)
Platelets: 194 10*3/uL (ref 150–400)
RBC: 4.53 MIL/uL (ref 4.22–5.81)
RDW: 13.6 % (ref 11.5–15.5)
WBC: 6.3 10*3/uL (ref 4.0–10.5)

## 2012-04-27 LAB — BASIC METABOLIC PANEL
BUN: 15 mg/dL (ref 6–23)
CO2: 27 mEq/L (ref 19–32)
Calcium: 9.6 mg/dL (ref 8.4–10.5)
Chloride: 101 mEq/L (ref 96–112)
Creatinine, Ser: 1.16 mg/dL (ref 0.50–1.35)
GFR calc Af Amer: 70 mL/min — ABNORMAL LOW (ref >90)
GFR calc non Af Amer: 61 mL/min — ABNORMAL LOW (ref >90)
Glucose, Bld: 99 mg/dL (ref 70–99)
Potassium: 4.1 mEq/L (ref 3.5–5.1)
Sodium: 138 mEq/L (ref 135–145)

## 2012-04-27 LAB — PHOSPHORUS: Phosphorus: 2.7 mg/dL (ref 2.3–4.6)

## 2012-04-27 LAB — MAGNESIUM: Magnesium: 2.1 mg/dL (ref 1.5–2.5)

## 2012-04-27 LAB — PROTIME-INR
INR: 0.98 (ref 0.00–1.49)
Prothrombin Time: 12.9 seconds (ref 11.6–15.2)

## 2012-04-27 LAB — APTT: aPTT: 28 seconds (ref 24–37)

## 2012-04-27 LAB — TROPONIN I: Troponin I: <0.3 ng/mL (ref <0.30)

## 2012-04-27 MED ORDER — NITROGLYCERIN 0.4 MG SL SUBL: 0.4000 mg | SUBLINGUAL_TABLET | SUBLINGUAL | Status: DC | PRN

## 2012-04-27 MED ORDER — SODIUM CHLORIDE 0.9 % IJ SOLN
3.0000 mL | INTRAMUSCULAR | Status: DC | PRN
  Filled 2012-04-27: qty 3

## 2012-04-27 MED ORDER — ALUM & MAG HYDROXIDE-SIMETH 200-200-20 MG/5ML PO SUSP: 30.0000 mL | Freq: Four times a day (QID) | ORAL | Status: DC | PRN

## 2012-04-27 MED ORDER — ACETAMINOPHEN 650 MG RE SUPP: 650.0000 mg | Freq: Four times a day (QID) | RECTAL | Status: DC | PRN

## 2012-04-27 MED ORDER — PAROXETINE HCL 20 MG PO TABS
10.0000 mg | ORAL_TABLET | Freq: Every day | ORAL | Status: DC
  Administered 2012-04-27: 10 mg via ORAL
  Administered 2012-04-28: 22:00:00 via ORAL
  Filled 2012-04-27 (×4): qty 1

## 2012-04-27 MED ORDER — LATANOPROST 0.005 % OP SOLN
1.0000 [drp] | Freq: Every day | OPHTHALMIC | Status: DC
  Administered 2012-04-28: 1 [drp] via OPHTHALMIC
  Filled 2012-04-27: qty 2.5

## 2012-04-27 MED ORDER — FAMOTIDINE 20 MG PO TABS
40.0000 mg | ORAL_TABLET | Freq: Two times a day (BID) | ORAL | Status: DC
  Administered 2012-04-27 – 2012-04-28 (×3): 40 mg via ORAL
  Filled 2012-04-27: qty 1
  Filled 2012-04-27: qty 2
  Filled 2012-04-27 (×2): qty 1

## 2012-04-27 MED ORDER — SODIUM CHLORIDE 0.9 % IV SOLN
Freq: Once | INTRAVENOUS | Status: AC
  Administered 2012-04-27: 16:00:00 via INTRAVENOUS

## 2012-04-27 MED ORDER — ONDANSETRON HCL 4 MG PO TABS: 4.0000 mg | ORAL_TABLET | Freq: Four times a day (QID) | ORAL | Status: DC | PRN

## 2012-04-27 MED ORDER — METOPROLOL TARTRATE 25 MG PO TABS
25.0000 mg | ORAL_TABLET | Freq: Two times a day (BID) | ORAL | Status: DC
  Administered 2012-04-28: 25 mg via ORAL
  Filled 2012-04-27 (×2): qty 1

## 2012-04-27 MED ORDER — ASPIRIN 81 MG PO CHEW
81.0000 mg | CHEWABLE_TABLET | Freq: Every day | ORAL | Status: DC
  Administered 2012-04-27 – 2012-04-28 (×2): 81 mg via ORAL
  Filled 2012-04-27 (×2): qty 1

## 2012-04-27 MED ORDER — ACETAMINOPHEN 325 MG PO TABS: 650.0000 mg | ORAL_TABLET | Freq: Four times a day (QID) | ORAL | Status: DC | PRN

## 2012-04-27 MED ORDER — SODIUM CHLORIDE 0.9 % IJ SOLN
3.0000 mL | Freq: Two times a day (BID) | INTRAMUSCULAR | Status: DC
  Administered 2012-04-27 – 2012-04-28 (×2): 3 mL via INTRAVENOUS

## 2012-04-27 MED ORDER — SODIUM CHLORIDE 0.9 % IJ SOLN
3.0000 mL | Freq: Two times a day (BID) | INTRAMUSCULAR | Status: DC
  Administered 2012-04-27 – 2012-04-28 (×3): 3 mL via INTRAVENOUS
  Filled 2012-04-27: qty 3

## 2012-04-27 MED ORDER — CLOPIDOGREL BISULFATE 75 MG PO TABS
75.0000 mg | ORAL_TABLET | Freq: Every day | ORAL | Status: DC
  Administered 2012-04-27 – 2012-04-28 (×2): 75 mg via ORAL
  Filled 2012-04-27 (×2): qty 1

## 2012-04-27 MED ORDER — SODIUM CHLORIDE 0.9 % IV SOLN: 250.0000 mL | INTRAVENOUS | Status: DC | PRN

## 2012-04-27 MED ORDER — ENOXAPARIN SODIUM 40 MG/0.4ML ~~LOC~~ SOLN
40.0000 mg | SUBCUTANEOUS | Status: DC
  Administered 2012-04-28: 40 mg via SUBCUTANEOUS
  Filled 2012-04-27 (×2): qty 0.4

## 2012-04-27 MED ORDER — ONDANSETRON HCL 4 MG/2ML IJ SOLN: 4.0000 mg | Freq: Four times a day (QID) | INTRAMUSCULAR | Status: DC | PRN

## 2012-04-27 NOTE — ED Provider Notes (Addendum)
History   This chart was scribed for Kenneth Kaplan, MD by Sofie Rower. The patient was seen in room APA05/APA05 and the patient's care was started at 3:27PM    CSN: [id number]  Arrival date & time 04/27/12  1416   First MD Initiated Contact with Patient 04/27/12 1527      Chief Complaint  Patient presents with  . Numbness    (Consider location/radiation/quality/duration/timing/severity/associated sxs/prior treatment) Patient is a 71 y.o. male presenting with extremity weakness. The history is provided by the patient and the spouse. No language interpreter was used.  Extremity Weakness This is a new problem. The current episode started yesterday. The problem occurs constantly. The problem has been gradually worsening. Pertinent negatives include no chest pain, no abdominal pain, no headaches and no shortness of breath. Nothing aggravates the symptoms. Nothing relieves the symptoms. He has tried nothing for the symptoms. The treatment provided no relief.    Kenneth [name] is a 71 y.o. male , with a hx of stroke, who presents to the Emergency Department complaining of sudden, progressively worsening, numbness located at the left hand onset yesterday with associated symptoms of droop at the left eye, and tingling at the left upper lip. The pt reports the numbness he is experiencing is constant and primarily located at the ring finger on the left hand. In addition, the pt informs that he had not been taking his blood thinning medication for about 5 days recently, and began taking the medication again, 2 days ago on 04/25/12. The pt has a hx of hypertension, spinal headache, eye surgery, and allergy to lorazepam.   The pt does not smoke or drink alcohol.   PCP is Dr. Ouida Sills.    Past Medical History  Diagnosis Date  . Hypertension   . Stroke   . ASCVD (arteriosclerotic cardiovascular disease)   . GERD (gastroesophageal reflux disease)   . Abnormal chest x-ray   . Tobacco abuse   . Anxiety    . Depression   . High triglycerides   . Laryngeal carcinoma   . Nephrolithiasis   . Acute renal failure   . Coronary artery disease     Past Surgical History  Procedure Date  . Partial laryngectomy 1998  . Cholecystectomy 2007  . Coronary artery bypass graft   . Colonoscopy 11/10/2011    Procedure: COLONOSCOPY;  Surgeon: Corbin Ade, MD;  Location: [facility];  Service: Endoscopy;  Laterality: N/A;  8:30 AM  . Eye surgery   . Cardiac surgery     Family History  Problem Relation Age of Onset  . Dementia Mother   . Heart disease Father   . Colon cancer Neg Hx     History  Substance Use Topics  . Smoking status: Former Smoker -- 1.0 packs/day for 12 years    Types: Cigarettes  . Smokeless tobacco: Not on file  . Alcohol Use: No      Review of Systems  Respiratory: Negative for shortness of breath.   Cardiovascular: Negative for chest pain.  Gastrointestinal: Negative for abdominal pain.  Musculoskeletal: Positive for extremity weakness.  Neurological: Negative for headaches.  All other systems reviewed and are negative.    Allergies  Erythromycin; Lorazepam; Penicillins; Sulfonamide derivatives; and Statins  Home Medications   Current Outpatient Rx  Name Route Sig Dispense Refill  . CLOPIDOGREL BISULFATE 75 MG PO TABS Oral Take 75 mg by mouth at bedtime.     Marland Kitchen FAMOTIDINE 40 MG PO TABS Oral Take  40 mg by mouth 2 (two) times daily.      . OMEGA-3 FATTY ACIDS 1000 MG PO CAPS Oral Take 1 g by mouth 3 (three) times daily.     Marland Kitchen LATANOPROST 0.005 % OP SOLN Both Eyes Place 1 drop into both eyes at bedtime.    Marland Kitchen METOPROLOL TARTRATE 50 MG PO TABS Oral Take 25 mg by mouth 2 (two) times daily.      Marland Kitchen PAROXETINE HCL 10 MG PO TABS Oral Take 10 mg by mouth at bedtime.     Marland Kitchen NITROGLYCERIN 0.4 MG SL SUBL Sublingual Place 1 tablet (0.4 mg total) under the tongue every 5 (five) minutes as needed for chest pain. 25 tablet 12    BP 132/73  Pulse 50  Temp 98.2 F (36.8  C) (Oral)  Resp 18  Ht 5\' 5"  (1.651 m)  Wt 180 lb (81.647 kg)  BMI 29.95 kg/m2  SpO2 96%  Physical Exam  Nursing note and vitals reviewed. Constitutional: He is oriented to person, place, and time. He appears well-developed and well-nourished.  HENT:  Head: Atraumatic.  Right Ear: External ear normal.  Left Ear: External ear normal.  Nose: Nose normal.       Mild right sided avail, facial flattening.   Eyes: Conjunctivae normal and EOM are normal. Pupils are equal, round, and reactive to light. Right eye exhibits no nystagmus. Left eye exhibits no nystagmus.  Neck: Normal range of motion.  Cardiovascular: Normal rate, regular rhythm and normal heart sounds.   No murmur heard. Pulmonary/Chest: Effort normal and breath sounds normal.  Abdominal: Soft. Bowel sounds are normal. There is no tenderness.  Musculoskeletal: Normal range of motion.       Able to discriminate between sharp and dull in the upper extremities bilaterally. Ability to discriminate is more pronounced in the right than the left. Digits: ability to discriminate is more pronounced in right side is more pronounced than the left. Upper extremity strength normal bilaterally. No protonator drift. Lower extremity sensation is intact. Cerebellar exam is normal.   Neurological: He is alert and oriented to person, place, and time.  Skin: Skin is warm and dry.  Psychiatric: He has a normal mood and affect. His behavior is normal.    ED Course  Procedures (including critical care time)  DIAGNOSTIC STUDIES: Oxygen Saturation is 96% on room air, normal by my interpretation.    COORDINATION OF CARE:    3:35PM- MRI and possible hospital admission discussed with patient. Pt agrees with treatment.   Labs Reviewed - No data to display No results found.   No diagnosis found.    MDM  Medical screening examination/treatment/procedure(s) were performed by me as the supervising physician. Scribe service was utilized for  documentation only.  DDx includes:  Stroke - ischemic vs. hemorrhagic TIA Neuropathy Myelitis Electrolyte abnormality Neuropathy Muscular disease  PMHX of  1. Coronary artery disease status post bypass surgery.  2. Strokes - 5 years ago with left sided deficits.  Pt comes in with numbness x 2 weeks to the left digits, and palm, constant, but with intermittent worsening and occasionally unable to use it properly. He still has numbness today. Pt also had 2-3 episodes of drooping to the left side. concerns for acute stroke. He has some right sided nasolabial folding and left sided sensory deficits for me, nihss = 2.  Will need admission and further neurology workup.     Kenneth Kaplan, MD 04/27/12 1610   Date: 04/27/2012  Rate:  46  Rhythm: sinus bradycardia  QRS Axis: normal  Intervals: normal  ST/T Wave abnormalities: nonspecific ST/T changes  Conduction Disutrbances:none  Narrative Interpretation:   Old EKG Reviewed: unchanged    Kenneth Kaplan, MD 04/27/12 1623

## 2012-04-27 NOTE — ED Notes (Signed)
Patient c/o numbness in L fingers and occasionally in  Hand that has been present for at least 1 week.  Yesterday and last PM, pt ha drooping of L eye and HA.  Wife reports his short term memory has been impaired.  Today, minimal L ptosis noted.  Facial symmetry intact.  LUE strength 4+ bilaterally.  No pronator drift noted.  Speech clear.  Oriented x 4.

## 2012-04-27 NOTE — ED Notes (Addendum)
Lt hand numbness since Tuesday, headache, Had trouble opening lt eye  Yesterday.  Had been off plavix for 5 days for surgery,and removal of skin cancer on chest. Restarted plavix 10/1

## 2012-04-27 NOTE — ED Notes (Signed)
Pt transported to MRI 

## 2012-04-27 NOTE — ED Notes (Addendum)
Pt ambulated to restroom without assistance. Steady gait noted. Dinner tray ordered for said pt.

## 2012-04-28 ENCOUNTER — Observation Stay (HOSPITAL_COMMUNITY): Payer: Medicare Other

## 2012-04-28 DIAGNOSIS — I369 Nonrheumatic tricuspid valve disorder, unspecified: Secondary | ICD-10-CM

## 2012-04-28 MED ORDER — RAMIPRIL 2.5 MG PO CAPS
2.5000 mg | ORAL_CAPSULE | Freq: Every day | ORAL | Status: DC
  Administered 2012-04-28: 2.5 mg via ORAL
  Filled 2012-04-28: qty 1

## 2012-04-28 NOTE — H&P (Signed)
NAME:  Kenneth [name], Kenneth [name] NO.:  [id number]  MEDICAL RECORD NO.:  [id number]  LOCATION:  A303                          FACILITY:  [facility]  PHYSICIAN:  Kingsley Callander. Ouida Sills, MD       DATE OF BIRTH:  1940-08-03  DATE OF ADMISSION:  04/27/2012 DATE OF DISCHARGE:  LH                             HISTORY & PHYSICAL   CHIEF COMPLAINT:  Left hand numbness.  HISTORY OF PRESENT ILLNESS:  This patient is a 71 year old white male with a history of coronary artery disease and stroke who presented with recurrent symptoms of numbness in his left hand over the last 2 weeks. He also complained of 2 episodes of drooping of his left eye over the past 24 hours.  The left eye stayed drooped for over an hour the day prior to admission.  He also felt a numbness in his left lip as if he might be getting a fever blister, but has not seen any skin lesions.  He has a history of stroke in 2008.  He have the emergency room physician the history that he had been off of Plavix for 5 days this past week. At the time of my evaluation, his symptoms had resolved.  He had undergone a CT scan in the emergency room, which revealed multifocal small vessel changes and an area of decreased attenuation in the anterior right parietal lobe consistent with a stroke of uncertain age. The patient was hospitalized for additional evaluation, monitoring, and treatment.  PAST MEDICAL HISTORY: 1. Strokes. 2. Coronary artery disease, status post 3-vessel bypass surgery in     2003. 3. Laryngeal carcinoma. 4. Hyperlipidemia with statin intolerance. 5. Cholecystectomy. 6. Anxiety. 7. GERD. 8. Bradycardia. 9. Nephrolithiasis.  MEDICATIONS: 1. Plavix 75 mg daily. 2. Paxil 10 mg daily. 3. Pepcid 40 mg b.i.d. 4. Metoprolol 25 mg b.i.d.  ALLERGIES:  Penicillin is also intolerant of erythromycin and SULFA.  FAMILY HISTORY:  His father died of an MI at 52 and had hypertension. Two brothers had hypertension.  His mother  died in her early 37s with Alzheimer's disease.  Two sisters have had dementia.  SOCIAL HISTORY:  He does not smoke, drink, or use drugs.  REVIEW OF SYSTEMS:  No fever, chills, loss of consciousness, chest pain, difficulty breathing, change in bowel habits or difficulty voiding.  He did have a headache the night prior to admission.  PHYSICAL EXAMINATION:  VITAL SIGNS:  Temperature 98.2, pulse 50, blood pressure 132/73, respirations 18. GENERAL:  Alert, oriented, and in no distress. HEENT:  Face is symmetric.  The pupils are small and slightly reactive. No scleral icterus.  Tongue is midline. NECK:  Supple with no JVD or thyromegaly. LUNGS:  Clear. HEART:  Bradycardic with no murmurs. ABDOMEN:  Nontender with no hepatosplenomegaly. EXTREMITIES:  Normal pulses with no clubbing or edema. NEURO:  He has stable coarseness and diminished voice.  There is no sign of facial drooping.  There is no focal weakness in the extremities.  The sensory changes in the left hand have currently resolved he states. Gait is stable. LYMPH NODES:  No cervical or supraclavicular enlargement. SKIN:  Warm and dry.  LABORATORY DATA:  His  EKG reveals sinus bradycardia.  Sodium 138, potassium 4.1, bicarb 27, glucose 99, BUN 15, creatinine 1.16, magnesium 2.1, phosphorus 2.7.  White count 6.3, hemoglobin 14.5, platelets 194,000.  IMPRESSION/PLAN: 1. Transient ischemic attack versus stroke.  He will be hospitalized     in a monitored setting and will have serial neurologic checks.  An     MRI of the brain will be obtained.  He will be treated with Plavix     and aspirin.  He is unfortunately statin intolerant. 2. Coronary heart disease, stable. 3. Anxiety stable on Paxil. 4. Gastroesophageal reflux disease.  Continue famotidine. 5. Bradycardia. 6. Laryngeal carcinoma.  He has had no sign of recurrence.  His MRI of his brain in 2008 revealed chronic small vessel changes as well as acute infarcts in the  right parietal cortex and a small acute infarct in the right frontal white matter.  His echocardiogram then revealed normal left ventricular size with borderline hypertrophy.  No wall motion abnormalities were identified.  No significant valvular abnormalities were identified.  He did have mild calcification of his aortic valve.  His carotid ultrasound then revealed estimated stenoses on the right and left of 0-50%.  MR angiogram of the head was negative for any stenosis in the large or medium-sized vessels.     Kingsley Callander. Ouida Sills, MD     ROF/MEDQ  D:  04/28/2012  T:  04/28/2012  Job:  [id number]

## 2012-04-28 NOTE — Progress Notes (Signed)
NAME:  DENT, PLANTZ NO.:  [id number]  MEDICAL RECORD NO.:  [id number]  LOCATION:  A303                          FACILITY:  [facility]  PHYSICIAN:  Kingsley Callander. Ouida Sills, MD       DATE OF BIRTH:  02/22/1941  DATE OF PROCEDURE:  04/28/2012 DATE OF DISCHARGE:                                PROGRESS NOTE   Kenneth [name] has no stroke type complaints this morning.  He has had no slurred speech, recurrent facial weakness or any sensory or motor changes in his extremities.  PHYSICAL EXAMINATION:  VITAL SIGNS:  Temperature is 97.9, pulse 47, blood pressure 150/70. LUNGS:  Clear. HEART:  Regular with no murmurs.  He is bradycardic. EXTREMITIES:  No edema. NEURO:  No weakness in the extremities.  His face is symmetric.  His tongue is midline.  His speech is intact.  Cranial nerves II-XII grossly intact.  IMPRESSION/PLAN: 1. His MRI reveals multiple small areas of acute infarction in the     region of the posterior division of the right middle cerebral     artery.  Continue Plavix and aspirin.  Add Altace.  We will obtain     a carotid ultrasound.  We will obtain a Neurology consultation.     His case has been discussed with Dr. Gerilyn Pilgrim.  He is on telemetry.     He has had no evidence of atrial fibrillation. 2. Hypertension. 3. Coronary artery disease. 4. History of previous stroke.     Kingsley Callander. Ouida Sills, MD     ROF/MEDQ  D:  04/28/2012  T:  04/28/2012  Job:  [id number]

## 2012-04-28 NOTE — Consult Note (Signed)
[facility] Shimika Ames A. Gerilyn Pilgrim, MD     [email or web address]         Reason for Consult: Referring Physician:   MEHTAB [name] is an 71 y.o. male.  HPI:   Past Medical History  Diagnosis Date  . Hypertension   . Stroke   . ASCVD (arteriosclerotic cardiovascular disease)   . GERD (gastroesophageal reflux disease)   . Abnormal chest x-ray   . Tobacco abuse   . Anxiety   . Depression   . High triglycerides   . Laryngeal carcinoma   . Nephrolithiasis   . Acute renal failure   . Coronary artery disease     Past Surgical History  Procedure Date  . Partial laryngectomy 1998  . Cholecystectomy 2007  . Coronary artery bypass graft   . Colonoscopy 11/10/2011    Procedure: COLONOSCOPY;  Surgeon: Corbin Ade, MD;  Location: [facility];  Service: Endoscopy;  Laterality: N/A;  8:30 AM  . Eye surgery   . Cardiac surgery     Family History  Problem Relation Age of Onset  . Dementia Mother   . Heart disease Father   . Colon cancer Neg Hx     Social History:  reports that he has quit smoking. His smoking use included Cigarettes. He has a 12 pack-year smoking history. He does not have any smokeless tobacco history on file. He reports that he does not drink alcohol or use illicit drugs.  Allergies:  Allergies  Allergen Reactions  . Erythromycin   . Lorazepam Other (See Comments)    REACTION: Alters mental status. "Turns into maniac"  . Penicillins Hives  . Sulfonamide Derivatives Other (See Comments)    REACTION: Unknown to patient. States that MD states allergy per medical records  . Statins Itching and Rash    Medications:  Prior to Admission medications   Medication Sig Start Date End Date Taking? Authorizing Provider  clopidogrel (PLAVIX) 75 MG tablet Take 75 mg by mouth at bedtime.    Yes Historical Provider, MD  famotidine (PEPCID) 40 MG tablet Take 40 mg by mouth 2 (two) times daily.     Yes Historical Provider, MD  fish oil-omega-3 fatty acids 1000  MG capsule Take 1 g by mouth 3 (three) times daily.    Yes Historical Provider, MD  latanoprost (XALATAN) 0.005 % ophthalmic solution Place 1 drop into both eyes at bedtime.   Yes Historical Provider, MD  metoprolol (LOPRESSOR) 50 MG tablet Take 25 mg by mouth 2 (two) times daily.     Yes Historical Provider, MD  PARoxetine (PAXIL) 10 MG tablet Take 10 mg by mouth at bedtime.    Yes Historical Provider, MD  nitroGLYCERIN (NITROSTAT) 0.4 MG SL tablet Place 1 tablet (0.4 mg total) under the tongue every 5 (five) minutes as needed for chest pain. 08/13/11 08/12/12  [name], MD    Scheduled Meds:   . sodium chloride   Intravenous Once  . aspirin  81 mg Oral Daily  . clopidogrel  75 mg Oral QHS  . enoxaparin (LOVENOX) injection  40 mg Subcutaneous Q24H  . famotidine  40 mg Oral BID  . latanoprost  1 drop Both Eyes QHS  . metoprolol  25 mg Oral BID  . PARoxetine  10 mg Oral QHS  . ramipril  2.5 mg Oral Daily  . sodium chloride  3 mL Intravenous Q12H  . sodium chloride  3 mL Intravenous Q12H   Continuous Infusions:  PRN Meds:.sodium chloride, acetaminophen,  acetaminophen, alum & mag hydroxide-simeth, nitroGLYCERIN, ondansetron (ZOFRAN) IV, ondansetron, sodium chloride   Results for orders placed during the hospital encounter of 04/27/12 (from the past 48 hour(s))  CBC WITH DIFFERENTIAL     Status: Normal   Collection Time   04/27/12  3:54 PM      Component Value Range Comment   WBC 6.3  4.0 - 10.5 K/uL    RBC 4.53  4.22 - 5.81 MIL/uL    Hemoglobin 14.5  13.0 - 17.0 g/dL    HCT 60.4  54.0 - 98.1 %    MCV 95.4  78.0 - 100.0 fL    MCH 32.0  26.0 - 34.0 pg    MCHC 33.6  30.0 - 36.0 g/dL    RDW 19.1  47.8 - 29.5 %    Platelets 194  150 - 400 K/uL    Neutrophils Relative 55  43 - 77 %    Neutro Abs 3.5  1.7 - 7.7 K/uL    Lymphocytes Relative 37  12 - 46 %    Lymphs Abs 2.4  0.7 - 4.0 K/uL    Monocytes Relative 6  3 - 12 %    Monocytes Absolute 0.4  0.1 - 1.0 K/uL    Eosinophils  Relative 2  0 - 5 %    Eosinophils Absolute 0.1  0.0 - 0.7 K/uL    Basophils Relative 0  0 - 1 %    Basophils Absolute 0.0  0.0 - 0.1 K/uL   BASIC METABOLIC PANEL     Status: Abnormal   Collection Time   04/27/12  3:54 PM      Component Value Range Comment   Sodium 138  135 - 145 mEq/L    Potassium 4.1  3.5 - 5.1 mEq/L    Chloride 101  96 - 112 mEq/L    CO2 27  19 - 32 mEq/L    Glucose, Bld 99  70 - 99 mg/dL    BUN 15  6 - 23 mg/dL    Creatinine, Ser 6.21  0.50 - 1.35 mg/dL    Calcium 9.6  8.4 - 30.8 mg/dL    GFR calc non Af Amer 61 (*) >90 mL/min    GFR calc Af Amer 70 (*) >90 mL/min   TROPONIN I     Status: Normal   Collection Time   04/27/12  3:54 PM      Component Value Range Comment   Troponin I <0.30  <0.30 ng/mL   MAGNESIUM     Status: Normal   Collection Time   04/27/12  3:54 PM      Component Value Range Comment   Magnesium 2.1  1.5 - 2.5 mg/dL   PHOSPHORUS     Status: Normal   Collection Time   04/27/12  3:54 PM      Component Value Range Comment   Phosphorus 2.7  2.3 - 4.6 mg/dL   PROTIME-INR     Status: Normal   Collection Time   04/27/12  3:54 PM      Component Value Range Comment   Prothrombin Time 12.9  11.6 - 15.2 seconds    INR 0.98  0.00 - 1.49   APTT     Status: Normal   Collection Time   04/27/12  3:54 PM      Component Value Range Comment   aPTT 28  24 - 37 seconds     Ct Head Wo Contrast  04/27/2012  *RADIOLOGY REPORT*  Clinical Data: Left hand numbness  CT HEAD WITHOUT CONTRAST  Technique:  Contiguous axial images were obtained from the base of the skull through the vertex without contrast. Study was obtained within 24 hours of patient arrival at the hospital.  Comparison:  Brain MRI Dec 08, 2006  Findings:  Ventricles are normal in size and configuration.  There is no mass, hemorrhage, extra-axial fluid collection, or midline shift.  There are multiple foci of small vessel disease throughout the centra semiovale bilaterally.  There is somewhat  confluent decreased attenuation in the anterior right parietal lobe which measures approximately 3 x 2.5 cm in size.  Early infarct in this area cannot be excluded.  Gray-white compartments are normal.  Bony calvarium appears intact.  IMPRESSION: Multifocal small vessel disease in the centra semiovale bilaterally.  Area of decreased attenuation in the anterior right parietal lobe which this consistent with age uncertain infarct.  No mass or hemorrhage.   Original Report Authenticated By: Arvin Collard. WOODRUFF III, M.D.    Mr Brain Wo Contrast  04/27/2012  *RADIOLOGY REPORT*  Clinical Data: Left sided numbness.  Headache.  Throat cancer. TIA.  MRI HEAD WITHOUT CONTRAST  Technique:  Multiplanar, multiecho pulse sequences of the brain and surrounding structures were obtained according to standard protocol without intravenous contrast.  Comparison: CT 04/27/2012  Findings: Several small areas of restricted diffusion are present on the right.  These are present in the posterior frontal cortex as well as in the right parietal lobe.  These could all be in the right middle cerebral artery territory.  These are compatible with multiple areas of acute infarct.  All of these measure under 1 cm each.  No acute infarct on the left.  Generalized atrophy.  Chronic microvascular ischemic change in the white matter extending into the right basal ganglia.  Chronic ischemia in the pons.  Small chronic infarct right cerebellum. Vessels at the base of the brain are patent.  No mass lesion. Small area of chronic hemorrhage right parietal lobe.  IMPRESSION: Multiple small areas of acute infarct in the region of the posterior division of the right middle cerebral artery territory.  Atrophy and moderate chronic ischemic change.   Original Report Authenticated By: Camelia Phenes, M.D.     Review of Systems  Eyes: Negative.   Respiratory: Negative.   Cardiovascular: Negative.   Genitourinary: Negative.   Musculoskeletal: Negative.     Skin: Negative.   Neurological: Positive for headaches.   Blood pressure 150/70, pulse 47, temperature 97.9 F (36.6 C), temperature source Oral, resp. rate 20, height 5\' 5"  (1.651 m), weight 82.8 kg (182 lb 8.7 oz), SpO2 98.00%. Physical Exam  Assessment/Plan: ASSESSMENT/PLAN:  1. Multiple small infarcts involving the right MCA posterior branch. The multiple infarcts suggests embolic phenomena either cardioembolic or thromboembolic. Risk factors previous history of strokes, coronary artery disease, hypertension and dyslipidemia. The patient has been placed on dual antiplatelet agents of aspirin and Plavix. This is appropriate but I would not continue this after 3 months. The risk of hemorrhage outweighs any benefit after this. The patient should be maintained on statin and blood pressure control. Given the possible embolic phenomenon, a echo is also suggested. The patient has a carotid Doppler which is also pending.  SUBJECTIVE: The patient is a 70 year old white male who presents with a 2 day history of dysesthesia involving the left hand. On yesterday he also developed some symptoms involving the left facial region associated with drooping of the left eyelid. The  symptoms involving the facial region lasted for several hours. The left hand symptoms have been going on episodic in the last 2 days. He feels much better today. It appears that the patient had some symptoms involving the left handed 2008. He had imaging done at that time including MRA. MRA showed no intracranial occlusive disease. However, the MRI showed somewhat similar findings as he has during this most recent event. There were small infarcts involving the posterior parietal hemisphere on the right side. There is also significant small vessel ischemic changes. The patient does not report other symptoms although he does complain of having a headache that was mild on yesterday.  OBJECTIVE: GENERAL: This is a very pleasant man in no  acute distress.  HEENT: Status post neck surgery for laryngeal cancer with appropriate scarring. The patient status post cataract surgery bilaterally.  ABDOMEN: soft  EXTREMITIES: No edema   BACK: Unremarkable.  SKIN: Normal by inspection.    MENTAL STATUS: Alert and oriented. The language and cognition are generally intact. He does have significant dysarthria characterized by marked hypophonia. The patient reports that this is status post neck surgery for cancer. This is a long-standing finding. Judgment and insight normal.   CRANIAL NERVES: Pupils are equal, round and reactive to light and accomodation; extra ocular movements are full, there is no significant nystagmus; visual fields are full; upper and lower facial muscles are normal in strength and symmetric, there is no flattening of the nasolabial folds; tongue is midline; uvula is midline; shoulder elevation is normal.  MOTOR: Normal tone, bulk and strength; no pronator drift. The patient has good fine finger movements and dexterity movements bilaterally.  COORDINATION: Left finger to nose is normal, right finger to nose is normal, No rest tremor; no intention tremor; no postural tremor; no bradykinesia.  REFLEXES: Deep tendon reflexes are symmetrical and normal. Babinski reflexes are flexor bilaterally.   SENSATION: Normal to light touch, temperature, and pinprick.  The patient's brain MRI is reviewed in person there are multiple small infarcts involving the right parietal and posterior temporal areas. 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 There is also deep white matter scattered leukoencephalopathy. There may be a tiny decreased signal seen in the basal ganglia on echo gradient signal suggestive of the older microbe bleed.   Shloma Roggenkamp 04/28/2012, 9:11 AM

## 2012-04-28 NOTE — Care Management Note (Unsigned)
    Page 1 of 1   04/28/2012     12:06:41 PM   CARE MANAGEMENT NOTE 04/28/2012  Patient:  Kenneth [name], Kenneth [name]   Account Number:  [id number]  Date Initiated:  04/28/2012  Documentation initiated by:  Sharrie Rothman  Subjective/Objective Assessment:   Pt admitted from home with possible CVA. Pt lives with wife and will return home at discharge. Pt is independent with ADL's.     Action/Plan:   No CM or HH needs noted.   Anticipated DC Date:  04/30/2012   Anticipated DC Plan:  HOME/SELF CARE      DC Planning Services  CM consult      Choice offered to / List presented to:             Status of service:  In process, will continue to follow Medicare Important Message given?   (If response is "NO", the following Medicare IM given date fields will be blank) Date Medicare IM given:   Date Additional Medicare IM given:    Discharge Disposition:    Per UR Regulation:    If discussed at Long Length of Stay Meetings, dates discussed:    Comments:  04/28/12 1205 Arlyss Queen, RN BSN CM

## 2012-04-28 NOTE — Progress Notes (Signed)
*  PRELIMINARY RESULTS* Echocardiogram 2D Echocardiogram has been performed.  Conrad [place] 04/28/2012, 3:20 PM

## 2012-04-28 NOTE — Consult Note (Signed)
  [facility] Kenneth Sherrow A. Gerilyn Pilgrim, MD     [email or web address]         The patient's carotid duplex Doppler came back with a significant finding. The right ICA velocities 540 indicating likely a critical stenosis of greater than 80% and probably closer to 90%. The patient should be self for urgent carotid endarterectomy via a  vascular surgeon.

## 2012-04-28 NOTE — Progress Notes (Signed)
UR Chart Review Completed  

## 2012-04-29 MED ORDER — RAMIPRIL 2.5 MG PO CAPS: 2.5000 mg | ORAL_CAPSULE | Freq: Every day | ORAL | Status: DC

## 2012-04-29 MED ORDER — ASPIRIN 81 MG PO CHEW: 81.0000 mg | CHEWABLE_TABLET | Freq: Every day | ORAL | Status: DC

## 2012-04-29 NOTE — Progress Notes (Signed)
Patient received discharge instructions along with follow up appointments and prescriptions. Patient verbalized understanding of all instructions. Patient was escorted by staff to vehicle. Patient discharged to home in stable condition. 

## 2012-04-29 NOTE — Discharge Summary (Signed)
NAME:  Kenneth [name], Kenneth [name] NO.:  [id number]  MEDICAL RECORD NO.:  [id number]  LOCATION:  A303                          FACILITY:  [facility]  PHYSICIAN:  Kingsley Callander. Ouida Sills, MD       DATE OF BIRTH:  [birth year]  DATE OF ADMISSION:  04/27/2012 DATE OF DISCHARGE:  [facility]                              DISCHARGE SUMMARY   DISCHARGE DIAGNOSES: 1. Stroke. 2. Coronary artery disease 3. History of laryngeal carcinoma. 4. Hyperlipidemia. 5. Bradycardia. 6. Anxiety. 7. Gastroesophageal reflux disease. 8. Carotid artery disease.  DISCHARGE MEDICATIONS: 1. Plavix 75 mg daily. 2. Aspirin 81 mg daily. 3. Altace 2.5 mg daily. 4. Paxil 10 mg daily. 5. Pepcid 40 mg b.i.d. 6. Xalatan 1 drop both eyes at bedtime. 7. Fish oil 1 g t.i.d. 8. Nitroglycerin p.r.n.  HOSPITAL COURSE:  This patient is a 71 year old white male, who presented with intermittent numbness in his left hand with some drooping on 2 occasions of his left eye and transient numbness in his left upper lip.  He underwent a CT scan in the emergency room, which revealed an area of decreased attenuation in the anterior right parietal lobe.  He subsequently underwent an MRI of the brain, which revealed multiple small areas of acute infarct in the region of the posterior division of the right middle cerebral artery.  He had reportedly recently discontinued Plavix.  He had a carotid ultrasound, which revealed a greater than 80% stenosis of the right internal carotid artery.  His echocardiogram revealed normal LV function.  He was bradycardic on telemetry.  He had no sign of AFib.  He had been on metoprolol which was stopped.  Plavix was restarted.  Aspirin was started.  Altace was started.  He is statin intolerant.   His symptoms have resolved.  He shows no sign now of focal weakness. Sensory abnormalities resolved by admission.  His glucose was normal at 99.  Blood counts were normal.  He was seen in neurology consultation by  Dr. Gerilyn Pilgrim.  His case was discussed with Vascular Surgery (Dr. Imogene Burn).  Arrangements will be made for consultation at the vascular surgery office next week. He will follow up with me in 1 week.  He has been advised to avoid strenuous activity for now.  Compliance with medical therapy was stressed.  CONDITION AT DISCHARGE:  Improved.     Kingsley Callander. Ouida Sills, MD     ROF/MEDQ  D:  04/29/2012  T:  04/29/2012  Job:  [id number]

## 2012-05-01 ENCOUNTER — Other Ambulatory Visit: Payer: Self-pay

## 2012-05-01 DIAGNOSIS — I6529 Occlusion and stenosis of unspecified carotid artery: Secondary | ICD-10-CM

## 2012-05-04 ENCOUNTER — Encounter: Payer: Self-pay | Admitting: Vascular Surgery

## 2012-05-05 ENCOUNTER — Ambulatory Visit (INDEPENDENT_AMBULATORY_CARE_PROVIDER_SITE_OTHER): Payer: Medicare Other | Admitting: Vascular Surgery

## 2012-05-05 ENCOUNTER — Telehealth: Payer: Self-pay | Admitting: Cardiology

## 2012-05-05 ENCOUNTER — Encounter: Payer: Self-pay | Admitting: Vascular Surgery

## 2012-05-05 VITALS — BP 126/87 | HR 66 | Resp 20 | Ht 66.0 in | Wt 180.0 lb

## 2012-05-05 DIAGNOSIS — I6529 Occlusion and stenosis of unspecified carotid artery: Secondary | ICD-10-CM

## 2012-05-05 DIAGNOSIS — I639 Cerebral infarction, unspecified: Secondary | ICD-10-CM

## 2012-05-05 DIAGNOSIS — I6523 Occlusion and stenosis of bilateral carotid arteries: Secondary | ICD-10-CM | POA: Insufficient documentation

## 2012-05-05 DIAGNOSIS — Z01818 Encounter for other preprocedural examination: Secondary | ICD-10-CM

## 2012-05-05 NOTE — Progress Notes (Signed)
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 (Dec 09, 1940) male who presents with chief complaint: recent stroke.  This patient with known previous R CVA presents with recent admission to OSH for TIA.  He was admitted with L hand and forearm numbness.  His MRI demonstrated: Multiple small areas of acute infarct in the region of the posterior division of the right middle cerebral artery territory.  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  Previous by report he was only ~50% on this side at the time of a prior stroke.  The patient has never had amaurosis fugax or monocular blindness though he has had an episode L ptosis.  The patient has never had facial drooping or hemiplegia but notes a history of L arm weakness that resolved.  The patient has never had receptive or expressive aphasia.   The patient's previous neurologic deficits have resolved but he intermittent gets some numbness in left hand.  The patient's risks factors for carotid disease include: HTN, smoking, hyperlipidemia.  The patient has previous had laryngeal surgery and adjuvant XRT.  Past Medical History  Diagnosis Date  . Hypertension   . Stroke   . ASCVD (arteriosclerotic cardiovascular disease)   . GERD (gastroesophageal reflux disease)   . Abnormal chest x-ray   . Tobacco abuse   . Anxiety   . Depression   . High triglycerides   . Laryngeal carcinoma   . Nephrolithiasis   . Acute renal failure   . Coronary artery disease     Past Surgical History  Procedure Date  . Partial laryngectomy 1998  . Cholecystectomy 2007  . Coronary artery bypass graft   . Colonoscopy 11/10/2011    Procedure: COLONOSCOPY;  Surgeon: Corbin Ade, MD;  [facility];  Service: Endoscopy;   Laterality: N/A;  8:30 AM  . Eye surgery   . Cardiac surgery     History   Social History  . Marital Status: Married    Spouse Name: N/A    Number of Children: N/A  . Years of Education: N/A   Occupational History  . Full time    Social History Main Topics  . Smoking status: Former Smoker -- 1.0 packs/day for 12 years    Types: Cigarettes    Quit date: 07/26/1961  . Smokeless tobacco: Former Neurosurgeon    Quit date: 05/05/2002  . Alcohol Use: No  . Drug Use: No  . Sexually Active: Not Currently   Other Topics Concern  . Not on file   Social History Narrative   Walks on the treadmill every other day at the [facility] for the last couple of weeks.    Family History  Problem Relation Age of Onset  . Dementia Mother   . Heart disease Father   . Colon cancer Neg Hx   . Cancer Brother   . Heart disease Brother   . Hyperlipidemia Brother   . Cancer Brother     Current Outpatient Prescriptions on File Prior to Visit  Medication Sig Dispense Refill  . aspirin 81 MG chewable tablet Chew 1 tablet (81 mg total) by mouth daily.  30 tablet  12  . clopidogrel (PLAVIX) 75 MG tablet Take 75 mg by mouth at bedtime.       Marland Kitchen  famotidine (PEPCID) 40 MG tablet Take 40 mg by mouth 2 (two) times daily.        . fish oil-omega-3 fatty acids 1000 MG capsule Take 1 g by mouth 3 (three) times daily.       . nitroGLYCERIN (NITROSTAT) 0.4 MG SL tablet Place 1 tablet (0.4 mg total) under the tongue every 5 (five) minutes as needed for chest pain.  25 tablet  12  . PARoxetine (PAXIL) 10 MG tablet Take 10 mg by mouth at bedtime.       . ramipril (ALTACE) 2.5 MG capsule Take 1 capsule (2.5 mg total) by mouth daily.  30 capsule  12  . latanoprost (XALATAN) 0.005 % ophthalmic solution Place 1 drop into both eyes at bedtime.        Allergies  Allergen Reactions  . Erythromycin   . Lorazepam Other (See Comments)    REACTION: Alters mental status. "Turns into maniac"  . Penicillins Hives  . Sulfonamide  Derivatives Other (See Comments)    REACTION: Unknown to patient. States that MD states allergy per medical records  . Statins Itching and Rash    REVIEW OF SYSTEMS:  (Positives checked otherwise negative)  CARDIOVASCULAR:  [ ]  chest pain, [x]  chest pressure, [ ]  palpitations, [ ]  shortness of breath when laying flat, [x]  shortness of breath with exertion,   [x]  pain in feet when walking, [ ]  pain in feet when laying flat, [x]  history of blood clot in veins (DVT), [ ]  history of phlebitis, [ ]  swelling in legs, [ ]  varicose veins  PULMONARY:  [ ]  productive cough, [ ]  asthma, [ ]  wheezing  NEUROLOGIC:  [x]  weakness in arms or legs, [x]  numbness in arms or legs, [ ]  difficulty speaking or slurred speech, [ ]  temporary loss of vision in one eye, [x]  dizziness  HEMATOLOGIC:  [ ]  bleeding problems, [ ]  problems with blood clotting too easily  MUSCULOSKEL:  [ ]  joint pain, [ ]  joint swelling  GASTROINTEST:  [ ]   Vomiting blood, [ ]   Blood in stool     GENITOURINARY:  [ ]   Burning with urination, [ ]   Blood in urine  PSYCHIATRIC:  [ ]  history of major depression  INTEGUMENTARY:  [ ]  rashes, [ ]  ulcers  CONSTITUTIONAL:  [ ]  fever, [ ]  chills  Physical Examination  Filed Vitals:   05/05/12 1302 05/05/12 1304  BP: 143/89 126/87  Pulse: 75 66  Resp: 20   Height: 5\' 6"  (1.676 m)   Weight: 180 lb (81.647 kg)    Body mass index is 29.05 kg/(m^2).  General: A&O x 3, WDWN  Head: [place]/AT  Ear/Nose/Throat: Hearing grossly intact, nares w/o erythema or drainage, oropharynx w/o Erythema/Exudate  Eyes: PERRLA, EOMI, no ptosis  Neck: Supple, no nuchal rigidity, no palpable LAD  Pulmonary: Sym exp, good air movt, CTAB, no rales, rhonchi, & wheezing  Cardiac: RRR, Nl S1, S2, no Murmurs, rubs or gallops  Vascular: Vessel Right Left  Radial Palpable Palpable  Ulnar Palpable Palpable  Brachial Palpable Palpable  Carotid Palpable, without bruit Palpable, without bruit  Aorta Not  palpable N/A  Femoral Palpable Palpable  Popliteal Not palpable Not palpable  PT Faintly Palpable Faintly Palpable  DP Not Palpable Not Palpable   Gastrointestinal: soft, NTND, -G/R, - HSM, - masses, - CVAT B  Musculoskeletal: M/S 5/5 throughout , Extremities without ischemic changes   Neurologic: CN 2-12 intact , Pain and light touch intact in extremities , Motor  exam as listed above  Psychiatric: Judgment intact, Mood & affect appropriate for pt's clinical situation  Dermatologic: See M/S exam for extremity exam, no rashes otherwise noted  Lymph : No Cervical, Axillary, or Inguinal lymphadenopathy   Non-Invasive Vascular Imaging  CAROTID DUPLEX (Date: 05/05/12):   R ICA stenosis: 80-99% (hyoid notch)  R VA: patent and antegrade  L ICA stenosis: 40-59%  L VA: patent and antegrade  Outside Studies/Documentation 5 pages of outside documents were reviewed including: outside hospital discharge summary and carotid duplex.  Medical Decision Making  Kenneth [name] is a 71 y.o. male who presents with: sx R ICA stenosis > 80% in a prior operated and irradiated neck   The patient is actively having chest pressure vs. pain so risk stratification with his cardiologist will be needed  CTA Neck will be ordered to evaluate the carotid arteries and the aortic arch in order to see if he is a candidate for carotid stenting.  His prior operated on and irradiated neck will complicate the surgical revision substantially, as the tissue plane may be non-existent an the tissue may be friable from the radiation.  On the other hand, angioplasty of irradiated carotid arteries have resulted in rupture of them in some series, so consideration of possible covered stenting may be necessary.  Once the cardiology preop evaluation and CTA Neck are available, I will be able to give the patient a better analysis of his options.  In the short term, there is no advantage to immediately intervening,  given the presence of MRI verified CVA.  Relative hypotension due to intervention can result in extension of ischemia in recently infarct territory and also cause hemorrhagic conversion.  I discussed in depth with the patient the nature of atherosclerosis, and emphasized the importance of maximal medical management including strict control of blood pressure, blood glucose, and lipid levels, obtaining regular exercise, antiplatelet agents, and cessation of smoking.    The patient is aware that without maximal medical management the underlying atherosclerotic disease process will progress, limiting the benefit of any interventions.  He is going to follow up with [place] in the next 2-4 weeks.  Thank you for allowing [place] to participate in this patient's care.  Leonides Sake, MD [facility] [place] Office: [phone] Pager: [phone]  05/05/2012, 6:25 PM

## 2012-05-05 NOTE — Progress Notes (Signed)
Carotid duplex performed @ [facility] 05/05/2012

## 2012-05-08 ENCOUNTER — Encounter: Payer: Self-pay | Admitting: Internal Medicine

## 2012-05-08 ENCOUNTER — Ambulatory Visit (INDEPENDENT_AMBULATORY_CARE_PROVIDER_SITE_OTHER): Payer: Medicare Other | Admitting: Internal Medicine

## 2012-05-08 VITALS — BP 150/80 | HR 58 | Ht 65.5 in | Wt 181.0 lb

## 2012-05-08 DIAGNOSIS — I2510 Atherosclerotic heart disease of native coronary artery without angina pectoris: Secondary | ICD-10-CM

## 2012-05-08 DIAGNOSIS — Z01810 Encounter for preprocedural cardiovascular examination: Secondary | ICD-10-CM | POA: Insufficient documentation

## 2012-05-08 NOTE — Assessment & Plan Note (Signed)
He has no anginal symptoms and coronary perfusion has been good. His left ventricular function is normal. No additional evaluation is recommended. He will continue his current medical therapy.

## 2012-05-08 NOTE — Progress Notes (Signed)
HPI Mr. Tezak is referred today by Dr.Chen for preop evaluation prior to undergoing carotid endarterectomy. The patient is a 71 year old man with hypertension and strokes. 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 The patient remains active. He plays golf regularly and has not had trouble with his breathing. He can walk an unlimited distance on flat ground. On inclines he has to slow down and occasionally will even stop to catch his breath. No anginal symptoms. He has not had syncope. Review of his records demonstrates normal left ventricular function and normal perfusion on stress testing documented in the last 10 months. Allergies  Allergen Reactions  . Erythromycin   . Lorazepam Other (See Comments)    REACTION: Alters mental status. "Turns into maniac"  . Penicillins Hives  . Sulfonamide Derivatives Other (See Comments)    REACTION: Unknown to patient. States that MD states allergy per medical records  . Statins Itching and Rash     Current Outpatient Prescriptions  Medication Sig Dispense Refill  . aspirin 81 MG chewable tablet Chew 1 tablet (81 mg total) by mouth daily.  30 tablet  12  . clopidogrel (PLAVIX) 75 MG tablet Take 75 mg by mouth at bedtime.       . famotidine (PEPCID) 40 MG tablet Take 40 mg by mouth 2 (two) times daily.        . fish oil-omega-3 fatty acids 1000 MG capsule Take 1 g by mouth 3 (three) times daily.       Marland Kitchen latanoprost (XALATAN) 0.005 % ophthalmic solution Place 1 drop into both eyes at bedtime.      . nitroGLYCERIN (NITROSTAT) 0.4 MG SL tablet Place 1 tablet (0.4 mg total) under the tongue every 5 (five) minutes as needed for chest pain.  25 tablet  12  . PARoxetine (PAXIL) 10 MG tablet Take 10 mg by mouth at bedtime.       . ramipril (ALTACE) 2.5 MG capsule Take 1 capsule (2.5 mg total) by mouth daily.  30 capsule  12     Past Medical History  Diagnosis Date  . Hypertension   . Stroke   .  ASCVD (arteriosclerotic cardiovascular disease)   . GERD (gastroesophageal reflux disease)   . Abnormal chest x-ray   . Tobacco abuse   . Anxiety   . Depression   . High triglycerides   . Laryngeal carcinoma   . Nephrolithiasis   . Acute renal failure   . Coronary artery disease     ROS:   All systems reviewed and negative except as noted in the HPI.   Past Surgical History  Procedure Date  . Partial laryngectomy 1998  . Cholecystectomy 2007  . Coronary artery bypass graft   . Colonoscopy 11/10/2011    Procedure: COLONOSCOPY;  Surgeon: Corbin Ade, MD;  [facility];  Service: Endoscopy;  Laterality: N/A;  8:30 AM  . Eye surgery   . Cardiac surgery      Family History  Problem Relation Age of Onset  . Dementia Mother   . Heart disease Father   . Colon cancer Neg Hx   . Cancer Brother   . Heart disease Brother   . Hyperlipidemia Brother   . Cancer Brother      History   Social History  . Marital Status: Married    Spouse Name: N/A    Number of Children: N/A  . Years of Education: N/A  Occupational History  . Full time    Social History Main Topics  . Smoking status: Former Smoker -- 1.0 packs/day for 12 years    Types: Cigarettes    Quit date: 07/26/1961  . Smokeless tobacco: Former Neurosurgeon    Quit date: 05/05/2002  . Alcohol Use: No  . Drug Use: No  . Sexually Active: Not Currently   Other Topics Concern  . Not on file   Social History Narrative   Walks on the treadmill every other day at the [facility] for the last couple of weeks.     BP 150/80  Pulse 58  Ht 5' 5.5" (1.664 m)  Wt 181 lb (82.101 kg)  BMI 29.66 kg/m2  SpO2 92%  Physical Exam:  Hoarse but otherwise Well appearing NAD HEENT: Unremarkable Neck:  No JVD, no thyromegally, with a well-healed scar near the larynx. Lungs:  Clear with no wheezes, rales, or rhonchi. HEART:  Regular rate rhythm, no murmurs, no rubs, no clicks Abd:  soft, positive bowel sounds, no  organomegally, no rebound, no guarding Ext:  2 plus pulses, no edema, no cyanosis, no clubbing Skin:  No rashes no nodules Neuro:  CN II through XII intact, motor grossly intact  EKG Normal sinus rhythm  Assess/Plan:

## 2012-05-08 NOTE — Patient Instructions (Signed)
Continue all current medications. Follow up as needed  

## 2012-05-08 NOTE — Assessment & Plan Note (Signed)
The patient's surgical risk for pending carotid endarterectomy is low from a cardiovascular perspective. His prior radiation therapy and prior laryngectomy may make the technical aspects of carotid endarterectomy more difficult. He is allowed to proceed with surgery as scheduled. We will be available if perioperative cardiovascular care is necessary.

## 2012-05-18 ENCOUNTER — Encounter: Payer: Self-pay | Admitting: Vascular Surgery

## 2012-05-19 ENCOUNTER — Ambulatory Visit (INDEPENDENT_AMBULATORY_CARE_PROVIDER_SITE_OTHER): Payer: Medicare Other | Admitting: Vascular Surgery

## 2012-05-19 ENCOUNTER — Ambulatory Visit: Payer: Medicare Other | Admitting: Vascular Surgery

## 2012-05-19 ENCOUNTER — Ambulatory Visit
Admission: RE | Admit: 2012-05-19 | Discharge: 2012-05-19 | Disposition: A | Discharge status: Home / Self Care | Payer: Medicare Other | Source: Ambulatory Visit | Attending: Vascular Surgery | Admitting: Vascular Surgery

## 2012-05-19 ENCOUNTER — Encounter: Payer: Self-pay | Admitting: Vascular Surgery

## 2012-05-19 VITALS — BP 146/80 | HR 59 | Resp 18 | Ht 66.0 in | Wt 180.0 lb

## 2012-05-19 DIAGNOSIS — I6523 Occlusion and stenosis of bilateral carotid arteries: Secondary | ICD-10-CM

## 2012-05-19 DIAGNOSIS — I6350 Cerebral infarction due to unspecified occlusion or stenosis of unspecified cerebral artery: Secondary | ICD-10-CM

## 2012-05-19 DIAGNOSIS — Z01818 Encounter for other preprocedural examination: Secondary | ICD-10-CM

## 2012-05-19 DIAGNOSIS — I658 Occlusion and stenosis of other precerebral arteries: Secondary | ICD-10-CM

## 2012-05-19 DIAGNOSIS — I6529 Occlusion and stenosis of unspecified carotid artery: Secondary | ICD-10-CM

## 2012-05-19 MED ORDER — IOHEXOL 350 MG/ML SOLN
100.0000 mL | Freq: Once | INTRAVENOUS | Status: AC | PRN
  Administered 2012-05-19: 100 mL via INTRAVENOUS

## 2012-05-19 NOTE — Progress Notes (Signed)
[facility] [place]  Established Carotid Patient  History of Present Illness  Kenneth [name] is a 71 y.o. (11/24/1940) male who presents with chief complaint: f/u neck CTA.  The patient has not had any new CVA sx.  He continues on Plavix/ASA.  Neck CTA to eval for possible carotid stenting was done today.  He has been cleared by his cardiologist.    Past Medical History, Past Surgical History, Social History, Family History, Medications, Allergies, and Review of Systems are unchanged from previous evaluation on 05/05/12.  Physical Examination  Filed Vitals:   05/19/12 1157 05/19/12 1158  BP: 132/75 146/80  Pulse: 62 59  Resp: 18   Height: 5' 6" (1.676 m)   Weight: 180 lb (81.647 kg)   SpO2: 97%    Body mass index is 29.05 kg/(m^2).  General: A&O x 3, WDWN   Neck: prior incision in neck  Pulmonary: Sym exp, good air movt, CTAB, no rales, rhonchi, & wheezing   Cardiac: RRR, Nl S1, S2, no Murmurs, rubs or gallops   Vascular:  Vessel  Right  Left   Radial  Palpable  Palpable   Ulnar  Palpable  Palpable   Brachial  Palpable  Palpable   Carotid  Palpable, without bruit  Palpable, without bruit   Aorta  Not palpable  N/A   Femoral  Palpable  Palpable   Popliteal  Not palpable  Not palpable   PT  Faintly Palpable  Faintly Palpable   DP  Not Palpable  Not Palpable    Gastrointestinal: soft, NTND, -G/R, - HSM, - masses, - CVAT B   Musculoskeletal: M/S 5/5 throughout , Extremities without ischemic changes   Neurologic: CN 2-12 intact , raspy voice, Pain and light touch intact in extremities , Motor exam as listed above   CTA Neck (Date: 05/19/12) IMPRESSION:  1. High-grade right ICA origin stenosis related to complex soft and calcified plaque. Stenosis estimated at 65-70%.  2. Soft plaque in the right common carotid artery with evidence of ulcerated plaque at the level of the thyroid cartilage.  3. Predominately calcified plaque at the left ICA origin  resulting in up to 60% stenosis. Evidence of a small area of ulcerated soft plaque at the distal left ICA bulb.  4. Codominant cervical vertebral arteries, negative except for tortuosity.  5. 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 Evidence of post radiation changes to the submandibular glands.  6. Bilateral upper lobe pulmonary scarring.  Based on my evaluation of the CTA Neck, patient R ICA stenosis 70-80% with complex morphology with eccentric plaque.  L ICA 60-70%.  Type I aortic arch.  Medical Decision Making  Kenneth [name] is a 71 y.o. male who presents with: sx R ICA stenosis >70%   Based on NASCET, he has evidence support intervention.  I have grave reservation with doing a CEA in a previous surgical field with XRT.  There is a >10% permanent cranial nerve injury rate associated with redo surgical fields.  Also XRT makes the vessel friable and susceptible to injury, so the risks are multiple.    Patient has potential issues with CAS due the friable disease in the ICA and CCA.  I will have the CTA Neck reviewed by my partners and Dr. Berry, per [place] requirements for all CAS cases.   I discussed in depth with the patient the nature of atherosclerosis, and emphasized the importance of maximal medical management including strict control of blood pressure,   blood glucose, and lipid levels, antiplatelet agents, obtaining regular exercise, and cessation of smoking.  The patient is aware that without maximal medical management the underlying atherosclerotic disease process will progress, limiting the benefit of any interventions.  Thank you for allowing us to participate in this patient's care.  Brian Chen, MD [facility]  Office: [phone] Pager: [phone]  05/19/2012, 12:37 PM     

## 2012-05-26 ENCOUNTER — Telehealth: Payer: Self-pay | Admitting: *Deleted

## 2012-05-26 NOTE — Telephone Encounter (Signed)
Mrs. Hornbeak called yesterday about plans for husband's surgery. Dr. Imogene Burn has not been able to discuss case with Dr. Allyson Sabal (because he is presently out of town). I called wife back today and told her this reason for delay. Patient and family are very anxious about all this. I tried to reassure them as best as I could and told them we would contact them asap. Dr. Allyson Sabal is scheduled to return next week and Dr. Imogene Burn will call him to discuss.    Patient's phone numbers: [phone] and [phone].

## 2012-05-31 ENCOUNTER — Other Ambulatory Visit: Payer: Self-pay | Admitting: *Deleted

## 2012-05-31 ENCOUNTER — Encounter: Payer: Self-pay | Admitting: *Deleted

## 2012-06-02 ENCOUNTER — Encounter (HOSPITAL_COMMUNITY): Payer: Self-pay | Admitting: Respiratory Therapy

## 2012-06-13 ENCOUNTER — Encounter (HOSPITAL_COMMUNITY)
Admission: RE | Disposition: A | Discharge status: Home / Self Care | Payer: Self-pay | Source: Ambulatory Visit | Attending: Surgery

## 2012-06-13 ENCOUNTER — Telehealth: Payer: Self-pay | Admitting: Vascular Surgery

## 2012-06-13 ENCOUNTER — Inpatient Hospital Stay (HOSPITAL_COMMUNITY)
Admission: RE | Admit: 2012-06-13 | Discharge: 2012-06-14 | DRG: 036 | Disposition: A | Discharge status: Home / Self Care | Payer: Medicare Other | Source: Ambulatory Visit | Attending: Surgery | Admitting: Surgery

## 2012-06-13 DIAGNOSIS — F329 Major depressive disorder, single episode, unspecified: Secondary | ICD-10-CM | POA: Diagnosis present

## 2012-06-13 DIAGNOSIS — E781 Pure hyperglyceridemia: Secondary | ICD-10-CM | POA: Diagnosis present

## 2012-06-13 DIAGNOSIS — I2510 Atherosclerotic heart disease of native coronary artery without angina pectoris: Secondary | ICD-10-CM | POA: Diagnosis present

## 2012-06-13 DIAGNOSIS — I6529 Occlusion and stenosis of unspecified carotid artery: Secondary | ICD-10-CM

## 2012-06-13 DIAGNOSIS — F411 Generalized anxiety disorder: Secondary | ICD-10-CM | POA: Diagnosis present

## 2012-06-13 DIAGNOSIS — K219 Gastro-esophageal reflux disease without esophagitis: Secondary | ICD-10-CM | POA: Diagnosis present

## 2012-06-13 DIAGNOSIS — I10 Essential (primary) hypertension: Secondary | ICD-10-CM | POA: Diagnosis present

## 2012-06-13 DIAGNOSIS — F17200 Nicotine dependence, unspecified, uncomplicated: Secondary | ICD-10-CM | POA: Diagnosis present

## 2012-06-13 DIAGNOSIS — Z8673 Personal history of transient ischemic attack (TIA), and cerebral infarction without residual deficits: Secondary | ICD-10-CM

## 2012-06-13 DIAGNOSIS — Z8521 Personal history of malignant neoplasm of larynx: Secondary | ICD-10-CM

## 2012-06-13 DIAGNOSIS — I6523 Occlusion and stenosis of bilateral carotid arteries: Secondary | ICD-10-CM

## 2012-06-13 DIAGNOSIS — F3289 Other specified depressive episodes: Secondary | ICD-10-CM | POA: Diagnosis present

## 2012-06-13 DIAGNOSIS — I658 Occlusion and stenosis of other precerebral arteries: Secondary | ICD-10-CM | POA: Diagnosis present

## 2012-06-13 HISTORY — PX: CAROTID STENT INSERTION: SHX5505

## 2012-06-13 LAB — POCT I-STAT, CHEM 8
BUN: 18 mg/dL (ref 6–23)
Calcium, Ion: 1.24 mmol/L (ref 1.13–1.30)
Chloride: 104 mEq/L (ref 96–112)
Creatinine, Ser: 1.1 mg/dL (ref 0.50–1.35)
Glucose, Bld: 102 mg/dL — ABNORMAL HIGH (ref 70–99)
HCT: 44 % (ref 39.0–52.0)
Hemoglobin: 15 g/dL (ref 13.0–17.0)
Potassium: 4.4 mEq/L (ref 3.5–5.1)
Sodium: 141 mEq/L (ref 135–145)
TCO2: 26 mmol/L (ref 0–100)

## 2012-06-13 LAB — POCT ACTIVATED CLOTTING TIME: Activated Clotting Time: 384 seconds

## 2012-06-13 SURGERY — CAROTID STENT INSERTION
Anesthesia: LOCAL

## 2012-06-13 MED ORDER — OXYCODONE-ACETAMINOPHEN 5-325 MG PO TABS
1.0000 | ORAL_TABLET | ORAL | Status: DC | PRN
  Administered 2012-06-13: 2 via ORAL
  Filled 2012-06-13: qty 2

## 2012-06-13 MED ORDER — FAMOTIDINE 10 MG PO TABS
10.0000 mg | ORAL_TABLET | Freq: Two times a day (BID) | ORAL | Status: DC
  Administered 2012-06-13 (×2): 10 mg via ORAL
  Filled 2012-06-13 (×4): qty 1

## 2012-06-13 MED ORDER — CLOPIDOGREL BISULFATE 75 MG PO TABS
75.0000 mg | ORAL_TABLET | Freq: Once | ORAL | Status: AC
  Administered 2012-06-13: 75 mg via ORAL

## 2012-06-13 MED ORDER — PHENOL 1.4 % MT LIQD: 1.0000 | OROMUCOSAL | Status: DC | PRN

## 2012-06-13 MED ORDER — GUAIFENESIN-DM 100-10 MG/5ML PO SYRP: 15.0000 mL | ORAL_SOLUTION | ORAL | Status: DC | PRN

## 2012-06-13 MED ORDER — RAMIPRIL 2.5 MG PO CAPS
2.5000 mg | ORAL_CAPSULE | Freq: Every day | ORAL | Status: DC
  Filled 2012-06-13: qty 1

## 2012-06-13 MED ORDER — CLOPIDOGREL BISULFATE 75 MG PO TABS
ORAL_TABLET | ORAL | Status: AC
  Filled 2012-06-13: qty 1

## 2012-06-13 MED ORDER — BIVALIRUDIN 250 MG IV SOLR
INTRAVENOUS | Status: AC
  Filled 2012-06-13: qty 250

## 2012-06-13 MED ORDER — MORPHINE SULFATE 2 MG/ML IJ SOLN: 2.0000 mg | INTRAMUSCULAR | Status: DC | PRN

## 2012-06-13 MED ORDER — ALUM & MAG HYDROXIDE-SIMETH 200-200-20 MG/5ML PO SUSP: 15.0000 mL | ORAL | Status: DC | PRN

## 2012-06-13 MED ORDER — ACETAMINOPHEN 650 MG RE SUPP: 325.0000 mg | RECTAL | Status: DC | PRN

## 2012-06-13 MED ORDER — ASPIRIN 81 MG PO CHEW
81.0000 mg | CHEWABLE_TABLET | Freq: Once | ORAL | Status: AC
  Administered 2012-06-13: 81 mg via ORAL

## 2012-06-13 MED ORDER — SODIUM CHLORIDE 0.9 % IV SOLN
INTRAVENOUS | Status: DC
  Administered 2012-06-13: 100 mL/h via INTRAVENOUS

## 2012-06-13 MED ORDER — ATROPINE SULFATE 1 MG/ML IJ SOLN
INTRAMUSCULAR | Status: AC
  Filled 2012-06-13: qty 1

## 2012-06-13 MED ORDER — SODIUM CHLORIDE 0.9 % IV SOLN
INTRAVENOUS | Status: DC
  Administered 2012-06-13: 08:00:00 via INTRAVENOUS

## 2012-06-13 MED ORDER — ASPIRIN 81 MG PO CHEW
81.0000 mg | CHEWABLE_TABLET | Freq: Every day | ORAL | Status: DC
  Administered 2012-06-13: 81 mg via ORAL
  Filled 2012-06-13: qty 1

## 2012-06-13 MED ORDER — LIDOCAINE HCL (PF) 1 % IJ SOLN
INTRAMUSCULAR | Status: AC
  Filled 2012-06-13: qty 30

## 2012-06-13 MED ORDER — DIAZEPAM 5 MG PO TABS
ORAL_TABLET | ORAL | Status: AC
  Administered 2012-06-13: 5 mg
  Filled 2012-06-13: qty 1

## 2012-06-13 MED ORDER — HEPARIN (PORCINE) IN NACL 2-0.9 UNIT/ML-% IJ SOLN
INTRAMUSCULAR | Status: AC
  Filled 2012-06-13: qty 500

## 2012-06-13 MED ORDER — HYDRALAZINE HCL 20 MG/ML IJ SOLN: 10.0000 mg | INTRAMUSCULAR | Status: DC | PRN

## 2012-06-13 MED ORDER — ASPIRIN 81 MG PO CHEW
CHEWABLE_TABLET | ORAL | Status: AC
  Administered 2012-06-13: 81 mg via ORAL
  Filled 2012-06-13: qty 4

## 2012-06-13 MED ORDER — DEXTROSE 5 % IV SOLN
INTRAVENOUS | Status: AC
  Filled 2012-06-13: qty 250

## 2012-06-13 MED ORDER — PAROXETINE HCL 10 MG PO TABS
10.0000 mg | ORAL_TABLET | Freq: Every day | ORAL | Status: DC
  Administered 2012-06-13: 10 mg via ORAL
  Filled 2012-06-13 (×2): qty 1

## 2012-06-13 MED ORDER — LABETALOL HCL 5 MG/ML IV SOLN: 10.0000 mg | INTRAVENOUS | Status: DC | PRN

## 2012-06-13 MED ORDER — METOPROLOL TARTRATE 1 MG/ML IV SOLN: 2.0000 mg | INTRAVENOUS | Status: DC | PRN

## 2012-06-13 MED ORDER — ACETAMINOPHEN 325 MG PO TABS: 325.0000 mg | ORAL_TABLET | ORAL | Status: DC | PRN

## 2012-06-13 MED ORDER — NOREPINEPHRINE BITARTRATE 1 MG/ML IJ SOLN
INTRAMUSCULAR | Status: AC
  Filled 2012-06-13: qty 4

## 2012-06-13 MED ORDER — CLOPIDOGREL BISULFATE 75 MG PO TABS
75.0000 mg | ORAL_TABLET | Freq: Every day | ORAL | Status: DC
  Administered 2012-06-13: 75 mg via ORAL
  Filled 2012-06-13 (×2): qty 1

## 2012-06-13 MED ORDER — NITROGLYCERIN 0.4 MG SL SUBL: 0.4000 mg | SUBLINGUAL_TABLET | SUBLINGUAL | Status: DC | PRN

## 2012-06-13 MED ORDER — ONDANSETRON HCL 4 MG/2ML IJ SOLN: 4.0000 mg | Freq: Four times a day (QID) | INTRAMUSCULAR | Status: DC | PRN

## 2012-06-13 NOTE — H&P (View-Only) (Signed)
[facility] [place]  Established Carotid Patient  History of Present Illness  Kenneth [name] is a 71 y.o. (05/23/1941) male who presents with chief complaint: f/u neck CTA.  The patient has not had any new CVA sx.  He continues on Plavix/ASA.  Neck CTA to eval for possible carotid stenting was done today.  He has been cleared by his cardiologist.    Past Medical History, Past Surgical History, Social History, Family History, Medications, Allergies, and Review of Systems are unchanged from previous evaluation on 05/05/12.  Physical Examination  Filed Vitals:   05/19/12 1157 05/19/12 1158  BP: 132/75 146/80  Pulse: 62 59  Resp: 18   Height: 5\' 6"  (1.676 m)   Weight: 180 lb (81.647 kg)   SpO2: 97%    Body mass index is 29.05 kg/(m^2).  General: A&O x 3, WDWN   Neck: prior incision in neck  Pulmonary: Sym exp, good air movt, CTAB, no rales, rhonchi, & wheezing   Cardiac: RRR, Nl S1, S2, no Murmurs, rubs or gallops   Vascular:  Vessel  Right  Left   Radial  Palpable  Palpable   Ulnar  Palpable  Palpable   Brachial  Palpable  Palpable   Carotid  Palpable, without bruit  Palpable, without bruit   Aorta  Not palpable  N/A   Femoral  Palpable  Palpable   Popliteal  Not palpable  Not palpable   PT  Faintly Palpable  Faintly Palpable   DP  Not Palpable  Not Palpable    Gastrointestinal: soft, NTND, -G/R, - HSM, - masses, - CVAT B   Musculoskeletal: M/S 5/5 throughout , Extremities without ischemic changes   Neurologic: CN 2-12 intact , raspy voice, Pain and light touch intact in extremities , Motor exam as listed above   CTA Neck (Date: 05/19/12) IMPRESSION:  1. High-grade right ICA origin stenosis related to complex soft and calcified plaque. Stenosis estimated at 65-70%.  2. Soft plaque in the right common carotid artery with evidence of ulcerated plaque at the level of the thyroid cartilage.  3. Predominately calcified plaque at the left ICA origin  resulting in up to 60% stenosis. Evidence of a small area of ulcerated soft plaque at the distal left ICA bulb.  4. Codominant cervical vertebral arteries, negative except for tortuosity.  5. 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 Evidence of post radiation changes to the submandibular glands.  6. Bilateral upper lobe pulmonary scarring.  Based on my evaluation of the CTA Neck, patient R ICA stenosis 70-80% with complex morphology with eccentric plaque.  L ICA 60-70%.  Type I aortic arch.  Medical Decision Making  Kenneth [name] is a 71 y.o. male who presents with: sx R ICA stenosis >70%   Based on NASCET, he has evidence support intervention.  I have grave reservation with doing a CEA in a previous surgical field with XRT.  There is a >10% permanent cranial nerve injury rate associated with redo surgical fields.  Also XRT makes the vessel friable and susceptible to injury, so the risks are multiple.    Patient has potential issues with CAS due the friable disease in the ICA and CCA.  I will have the CTA Neck reviewed by my partners and Dr. Allyson Sabal, per [facility] requirements for all CAS cases.   I discussed in depth with the patient the nature of atherosclerosis, and emphasized the importance of maximal medical management including strict control of blood pressure,  blood glucose, and lipid levels, antiplatelet agents, obtaining regular exercise, and cessation of smoking.  The patient is aware that without maximal medical management the underlying atherosclerotic disease process will progress, limiting the benefit of any interventions.  Thank you for allowing [place] to participate in this patient's care.  Leonides Sake, MD [facility] [place] Office: [phone] Pager: [phone]  05/19/2012, 12:37 PM

## 2012-06-13 NOTE — Telephone Encounter (Addendum)
Message copied by Shari Prows on Tue Jun 13, 2012 11:58 AM ------      Message from: Melene Plan      Created: Tue Jun 13, 2012 11:25 AM                   ----- Message -----         From: Nada Libman, MD         Sent: 06/13/2012  11:18 AM           To: Reuel Derby, Melene Plan, RN            06/13/2012                   1.  ultrasound access, right femoral artery       2.  aortic arch angiogram       3.  right carotid stent with distal embolic protection                  Dr. Allyson Sabal is the billing position for the right carotid stent with distal embolic protection. I will be the billing physician for ultrasound access and aortic arch angiogram. Please schedule the patient to come back and see Dr. Imogene Burn in one month with a carotid ultrasound.  I scheduled an appt for 07/21/12 at 11am for lab and to see [facility] at 12:30pm. I mailed an appt letter and also left a voice message for pt.awt

## 2012-06-13 NOTE — Progress Notes (Addendum)
Patient admitted from cath lab, no complaints, rt groin level 0 with transparent dressing cdi.  Continue to watch.

## 2012-06-13 NOTE — Progress Notes (Signed)
OOB to bathroom, no complaints, voiding without difficulty. Back to bed, no complaints.

## 2012-06-13 NOTE — Research (Signed)
[employer] Informed Consent   Subject Name: Kenneth [name]  Subject met inclusion and exclusion criteria.  The informed consent form, study requirements and expectations were reviewed with the subject and questions and concerns were addressed prior to the signing of the consent form.  The subject verbalized understanding of the trial requirements.  The subject agreed to participate in the [facility] trial and signed the informed consent.  The informed consent was obtained prior to performance of any protocol-specific procedures for the subject.  A copy of the signed informed consent was given to the subject and a copy was placed in the subject's medical record.  Brunilda Payor 06/13/2012, 10:38 AM

## 2012-06-13 NOTE — Op Note (Signed)
[facility] [place]  Patient name: Kenneth [name] MRN: [id number] DOB: 1941-05-10 Sex: male  06/13/2012 Pre-operative Diagnosis: Symptomatic right carotid stenosis Post-operative diagnosis:  Same Surgeon:  Jorge Ny, Nanetta Batty Procedure Performed:  1.  ultrasound access, right femoral artery  2.  aortic arch angiogram  3.  right carotid stent with distal embolic protection \  Indications:  This is a 71 year old gentleman with recent right brain stroke. He has previously undergone laryngectomy with radiation and has been deemed to be a poor surgical candidate. He therefore comes in today for carotid stenting to lower his risk for future stroke.  Procedure:  The patient was identified in the holding area and taken to room 8.  The patient was then placed supine on the table and prepped and draped in the usual sterile fashion.  A time out was called.  Ultrasound was used to evaluate the right common femoral artery.  It was patent .  A digital ultrasound image was acquired.  A micropuncture needle was used to access the right common femoral artery under ultrasound guidance.  An 018 wire was advanced without resistance and a micropuncture sheath was placed.  The 018 wire was removed and a benson wire was placed.  The micropuncture sheath was exchanged for a 6 french sheath. Next a pigtail catheter was advanced into the descending aorta and an aortic arch and exam was performed. Following that, a Berenstein 2 catheter was used to select the right common carotid artery. A right common carotid angiogram with intracranial images was obtained. The intracranial images will be determined by interventional radiology.  Findings:   Aortic arch:  A type I aortic arch is visualized. No stenosis is identified within the visualized portions of the innominate, right subclavian, left subclavian, and left common carotid artery. The right common carotid artery is also patent  proximally without stenosis.  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 A diminutive external carotid artery is visualized. There is a high-grade, 90% stenosis within the proximal portion of the internal carotid artery. There is a tortuous distal internal carotid artery.   Intervention:  After the above images were obtained, the decision was made to proceed with intervention. A long 6 [place] shuttle sheath was inserted. An Angiomax bolus and drip was initiated. The ACT was confirmed to be greater than 300. Next using the [facility] wire and a JB 1 guide catheter, the innominate artery was selected followed by the right common carotid artery. The JB 1 catheter was advanced into the midportion of the right common carotid artery. This was followed by advancement of the sheath. Several contrast injections were performed to confirm that the sheath was in good position. In nature that it was beyond the small pseudoaneurysm. A 5 mm Angioguard filter was prepared on the back table and then successfully deployed and a straight portion of the distal internal carotid artery. Next, the stenosis was predilated using a 3 x 2 balloon taking it to nominal pressure. The Cordis stent was prepared on the back table. This was a 9 x 30 stent. It was advanced over the wire and then successfully deployed across the lesion. A 5 mm balloon was used to mold the stent. A total of 1 mg of atropine was administered. Completion angiography was then performed. There was resolution of the stenosis after stent placement to less than 10%. Additional intracranial images were obtained which appeared  to be unchanged from pre-intervention. Next the filter was retrieved with the retrieval device. The 6 French long sheath was exchanged out for a 6 [place] short sheath. The patient was taken to the holding area for a sheath pull once the coagulation profile corrects. His  neurologic examination remained stable  Impression:  #1  successful right carotid stenting with distal embolic protection    V. Durene Cal, M.D. [facility] [place] Office: [phone] Pager:  [phone]

## 2012-06-13 NOTE — Interval H&P Note (Signed)
History and Physical Interval Note:  06/13/2012 9:50 AM  Kenneth [name]  has presented today for surgery, with the diagnosis of Stenosis  The various methods of treatment have been discussed with the patient and family. After consideration of risks, benefits and other options for treatment, the patient has consented to  Procedure(s) (LRB) with comments: CAROTID STENT INSERTION (N/A) as a surgical intervention .  The patient's history has been reviewed, patient examined, no change in status, stable for surgery.  I have reviewed the patient's chart and labs.  Questions were answered to the patient's satisfaction.     Zeric Baranowski IV, V. WELLS

## 2012-06-13 NOTE — Progress Notes (Signed)
Utilization review completed.  

## 2012-06-14 ENCOUNTER — Encounter (HOSPITAL_COMMUNITY): Payer: Self-pay | Admitting: *Deleted

## 2012-06-14 LAB — TROPONIN I: Troponin I: <0.3 ng/mL (ref <0.30)

## 2012-06-14 MED FILL — Dextrose Inj 5%: INTRAVENOUS | Qty: 50 | Status: AC

## 2012-06-14 NOTE — Discharge Summary (Signed)
  Agree with the above Neuro intact Right groin soft  D/c home with f/u in 1 month with carotid u/s Continue ASA/Plavix  Wells Brabham

## 2012-06-14 NOTE — Progress Notes (Signed)
Pt tol diet, voids, eating. D/c home. Reviewed d/c inst. Pt and family understood.

## 2012-06-14 NOTE — Progress Notes (Addendum)
VASCULAR AND VEIN SURGERY POST - OP CEA PROGRESS NOTE  Date of Surgery: 06/13/2012  Surgeon(s): Nada Libman, MD Runell Gess, MD 1 Day Post-Op right Carotid Stent .  HPI: Kenneth [name] is a 71 y.o. male who is 1 Day Post-Op right Carotid Endarterectomy . Patient is doing well. Pre-operative symptoms are Unchanged Patient denies headache; Patient denies difficulty swallowing; denies weakness in upper or lower extremities; Pt. denies other symptoms of stroke or TIA.  IMAGING: No results found.  Significant Diagnostic Studies: CBC Lab Results  Component Value Date   WBC 6.3 04/27/2012   HGB 15.0 06/13/2012   HCT 44.0 06/13/2012   MCV 95.4 04/27/2012   PLT 194 04/27/2012    BMET    Component Value Date/Time   NA 141 06/13/2012 0738   K 4.4 06/13/2012 0738   CL 104 06/13/2012 0738   CO2 27 04/27/2012 1554   GLUCOSE 102* 06/13/2012 0738   BUN 18 06/13/2012 0738   CREATININE 1.10 06/13/2012 0738   CALCIUM 9.6 04/27/2012 1554   GFRNONAA 61* 04/27/2012 1554   GFRAA 70* 04/27/2012 1554    COAG Lab Results  Component Value Date   INR 0.98 04/27/2012   INR 0.97 08/12/2011   No results found for this basename: PTT      Intake/Output Summary (Last 24 hours) at 06/14/12 0737 Last data filed at 06/13/12 2200  Gross per 24 hour  Intake    780 ml  Output    400 ml  Net    380 ml    Physical Exam:  BP Readings from Last 3 Encounters:  06/14/12 110/56  06/14/12 110/56  05/19/12 146/80   Temp Readings from Last 3 Encounters:  06/14/12 98.1 F (36.7 C) Oral  06/14/12 98.1 F (36.7 C) Oral  04/29/12 98.1 F (36.7 C) Oral   SpO2 Readings from Last 3 Encounters:  06/14/12 93%  06/14/12 93%  05/19/12 97%   Pulse Readings from Last 3 Encounters:  06/14/12 52  06/14/12 52  05/19/12 59    Pt is A&O x 3 Gait is normal Speech is fluent/hoarse as pre-op right groin soft, Wound is healing well Patient with Negative tongue deviation and Negative facial  droop Neuro exam is unchanged from pre-op Palpable DP pulse on right  Assessment/Plan:: Kenneth [name] is a 71 y.o. male is S/P Right Carotid Stent Pt is voiding, ambulating and taking po well Neuro exam stable Right groin wound soft    Discharge to: Home Follow-up in 4 weeks with Carotid Stent protocol   Kenneth [name] [phone] 06/14/2012 7:37 AM    Agree with the above Neuro intact Right groin soft  D/c home with f/u in 1 month with carotid u/s Continue ASA/Plavix  Kenneth [name]

## 2012-06-14 NOTE — Discharge Summary (Signed)
[facility] Discharge Summary   Patient ID:  Kenneth [name] MRN: [id number] DOB/AGE: 1941-01-27 71 y.o.  Admit date: 06/13/2012 Discharge date: 06/14/2012 Date of Surgery: 06/13/2012 Surgeon: Surgeon(s): Nada Libman, MD Runell Gess, MD  Admission Diagnosis: Stenosis  Discharge Diagnoses:  Stenosis  Secondary Diagnoses: Past Medical History  Diagnosis Date  . Hypertension   . Stroke   . ASCVD (arteriosclerotic cardiovascular disease)   . GERD (gastroesophageal reflux disease)   . Abnormal chest x-ray   . Tobacco abuse   . Anxiety   . Depression   . High triglycerides   . Laryngeal carcinoma   . Nephrolithiasis   . Acute renal failure   . Coronary artery disease     Procedure(s): CAROTID STENT INSERTION  Discharged Condition: good  HPI: Kenneth [name] is a 71 y.o. (01/29/1941) male who presents with chief complaint: recent stroke. This patient with known previous R CVA presents with recent admission to OSH for TIA. He was admitted with L hand and forearm numbness. His MRI demonstrated: Multiple small areas of acute infarct in the region of the posterior division of the right middle cerebral artery territory. 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 Previous by report he was only ~50% on this side at the time of a prior stroke. The patient has never had amaurosis fugax or monocular blindness though he has had an episode L ptosis. The patient has never had facial drooping or hemiplegia but notes a history of L arm weakness that resolved. The patient has never had receptive or expressive aphasia. The patient's previous neurologic deficits have resolved but he intermittent gets some numbness in left hand. The patient's risks factors for carotid disease include: HTN, smoking, hyperlipidemia. The patient has previous had laryngeal surgery and adjuvant XRT.  Pt was seen for f/u neck CTA. The patient has not had any new CVA sx. He  continues on Plavix/ASA. Neck CTA to eval for possible carotid stenting was done. He has been cleared by his cardiologist.    Hospital Course:  Kenneth [name] is a 71 y.o. male is S/P Right Procedure(s): CAROTID STENT INSERTION Post-op wounds healing well Pt. Ambulating, voiding and taking PO diet without difficulty. Pt pain controlled with PO pain meds. Neuro exam unchanged 5/5 equal BUE/BLE strength Speech hoarse from previous laryngeal surgery and adjuvant XRT.  Labs as below Complications:none  Consults:     Significant Diagnostic Studies: CBC Lab Results  Component Value Date   WBC 6.3 04/27/2012   HGB 15.0 06/13/2012   HCT 44.0 06/13/2012   MCV 95.4 04/27/2012   PLT 194 04/27/2012    BMET    Component Value Date/Time   NA 141 06/13/2012 0738   K 4.4 06/13/2012 0738   CL 104 06/13/2012 0738   CO2 27 04/27/2012 1554   GLUCOSE 102* 06/13/2012 0738   BUN 18 06/13/2012 0738   CREATININE 1.10 06/13/2012 0738   CALCIUM 9.6 04/27/2012 1554   GFRNONAA 61* 04/27/2012 1554   GFRAA 70* 04/27/2012 1554   COAG Lab Results  Component Value Date   INR 0.98 04/27/2012   INR 0.97 08/12/2011     Disposition:  Discharge to :Home Discharge Orders    Future Appointments: Provider: Department: Dept Phone: Center:   07/21/2012 11:00 AM [facility] [phone] [facility]   07/21/2012 12:30 PM Fransisco Hertz, MD [facility] [phone] [facility]     Future  Orders Please Complete By Expires   Resume previous diet      Driving Restrictions      Comments:   No driving for 2 weeks   Lifting restrictions      Comments:   No lifting for 4 weeks   Call MD for:  temperature >100.5      Call MD for:  redness, tenderness, or signs of infection (pain, swelling, bleeding, redness, odor or green/yellow discharge around incision site)      Call MD for:  severe or increased pain, loss or decreased feeling  in affected limb(s)       Increase activity slowly      Comments:   Walk with assistance use walker or cane as needed   May shower       Scheduling Instructions:   Thursday   Remove dressing in 24 hours      may wash over wound with mild soap and water      CAROTID Sugery: Call MD for difficulty swallowing or speaking; weakness in arms or legs that is a new symtom; severe headache.  If you have increased swelling in the neck and/or  are having difficulty breathing, CALL 911         Kenneth [name], Kenneth [name]  Home Medication Instructions JYN:[id number]   Printed on:06/14/12 0741  Medication Information                    PARoxetine (PAXIL) 10 MG tablet Take 10 mg by mouth at bedtime.            clopidogrel (PLAVIX) 75 MG tablet Take 75 mg by mouth at bedtime.            nitroGLYCERIN (NITROSTAT) 0.4 MG SL tablet Place 1 tablet (0.4 mg total) under the tongue every 5 (five) minutes as needed for chest pain.           aspirin 81 MG chewable tablet Chew 1 tablet (81 mg total) by mouth daily.           ramipril (ALTACE) 2.5 MG capsule Take 1 capsule (2.5 mg total) by mouth daily.           Omega-3 Fatty Acids (FISH OIL) 1200 MG CAPS Take 1,200 mg by mouth 3 (three) times daily.           famotidine (PEPCID) 10 MG tablet Take 10 mg by mouth 2 (two) times daily.            Verbal and written Discharge instructions given to the patient. Wound care per Discharge AVS Follow-up Information    Follow up with Kenneth [name], Kenneth Lund, MD. In 4 weeks. (office will arrange-sent)    Contact information:   [address] [place] [address] [phone]          Signed: Marlowe Shores 06/14/2012, 7:41 AM

## 2012-06-22 NOTE — Consult Note (Addendum)
NAME:  Kenneth [name], Kenneth [name] NO.:  [id number]  MEDICAL RECORD NO.:  [id number]  LOCATION:  3306                         FACILITY:  [facility]  PHYSICIAN:  Johnnathan Hagemeister K. Leandre Wien, M.D.DATE OF BIRTH:  22-Jan-1941  DATE OF CONSULTATION: DATE OF DISCHARGE:  06/14/2012                                CONSULTATION   CLINICAL HISTORY:  Progressive right internal carotid artery stenosis, extracranially.  EXAMINATION:  Intracranial interpretation of right common carotid arteriogram.  Pre and post placement of stent assisted angioplasty of the right internal carotid artery, extracranial stenosis.  FINDINGS:  The pretreatment common carotid arteriogram demonstrates the cervical petrous junction of the right internal carotid artery to be normal.  The petrous, cavernous, and supraclinoid segments demonstrated normal opacification.  A dominant right posterior communicating artery is seen opacifying the right posterior cerebral artery distribution.  The right middle cerebral artery opacifies normally.   Transient  right anterior cerebral artery opacification is seen.  The post stent placement with angioplasty arteriogram demonstrates hemodynamically increased flow to the right internal carotid artery in  the petrous, cavernous, and supraclinoid segments.    There are no changes noted in the right posterior cerebral artery distribution from the right posterior communicating artery. Intracranially the right middle cerebral artery fills briskly into the capillary and venous phases.  There is now also angiographic opacification of the right anterior cerebral artery into the A-2 segment.  Venous opacification of the left cerebral  hemisphere  is noted. IMPRESSION: 1. Pre and post stent assisted angioplasty of the left internal carotid extracranially, demonstrates no intraluminal filling defects, stenosis or dissections intracranially. 2. Improved hemodynamic flow post treatment  arteriogram  with     opacification of the right  anterior cerebral artery distribution.          ______________________________ Grandville Silos Corliss Skains, M.D.     SKD/MEDQ  D:  06/21/2012  T:  06/22/2012  Job:  [id number]

## 2012-07-18 ENCOUNTER — Other Ambulatory Visit: Payer: Self-pay | Admitting: *Deleted

## 2012-07-18 DIAGNOSIS — I6529 Occlusion and stenosis of unspecified carotid artery: Secondary | ICD-10-CM

## 2012-07-20 ENCOUNTER — Encounter: Payer: Self-pay | Admitting: Vascular Surgery

## 2012-07-21 ENCOUNTER — Ambulatory Visit (INDEPENDENT_AMBULATORY_CARE_PROVIDER_SITE_OTHER): Payer: Medicare Other | Admitting: Vascular Surgery

## 2012-07-21 ENCOUNTER — Other Ambulatory Visit (INDEPENDENT_AMBULATORY_CARE_PROVIDER_SITE_OTHER): Payer: Medicare Other | Admitting: *Deleted

## 2012-07-21 ENCOUNTER — Encounter: Payer: Self-pay | Admitting: Vascular Surgery

## 2012-07-21 VITALS — BP 137/76 | HR 66 | Ht 66.0 in | Wt 177.0 lb

## 2012-07-21 DIAGNOSIS — I779 Disorder of arteries and arterioles, unspecified: Secondary | ICD-10-CM | POA: Insufficient documentation

## 2012-07-21 DIAGNOSIS — I6529 Occlusion and stenosis of unspecified carotid artery: Secondary | ICD-10-CM

## 2012-07-21 DIAGNOSIS — Z48812 Encounter for surgical aftercare following surgery on the circulatory system: Secondary | ICD-10-CM

## 2012-07-21 DIAGNOSIS — I6350 Cerebral infarction due to unspecified occlusion or stenosis of unspecified cerebral artery: Secondary | ICD-10-CM

## 2012-07-21 NOTE — Addendum Note (Signed)
Addended by: Sharee Pimple on: 07/21/2012 02:34 PM   Modules accepted: Orders

## 2012-07-21 NOTE — Progress Notes (Signed)
[facility] [place]  Postoperative Visit  History of Present Illness  Kenneth [name] is a 71 y.o. male who presents for postoperative follow-up from procedure on Date: 06/13/12: R CAS.  The patient states he feels better and has had no CVA sx since the procedure.  He denies any right groin pain (cannulation site).  Past Medical History, Past Surgical History, Social History, Family History, Medications, Allergies, and Review of Systems are unchanged from previous evaluation on 06/13/12.  Physical Examination  Filed Vitals:   07/21/12 1116 07/21/12 1117  BP: 124/78 137/76  Pulse: 66   Height: 5\' 6"  (1.676 m)   Weight: 177 lb (80.287 kg)   SpO2: 100%    Body mass index is 28.57 kg/(m^2).  General: A&O x 3, WDWN  Pulmonary: Sym exp, good air movt, CTAB, no rales, rhonchi, & wheezing  Cardiac: RRR, Nl S1, S2, no Murmurs, rubs or gallops  Gastrointestinal: soft, NTND, -G/R, - HSM, - masses, - CVAT B  Musculoskeletal: M/S 5/5 throughout , Extremities without ischemic changes , right groin without hematoma, no echymosis present at cannulation site  Neurologic:  Pain and light touch intact in extremities , CN 2-12 intact, voice wispy as preop, Motor exam as listed above  Non-Invasive Vascular Imaging  CAROTID DUPLEX (Date: 07/21/12):   R ICA stenosis: widely patent stent  R VA: patent and antegrade  L ICA stenosis: 1-39%  L VA:  patent and antegrade  Medical Decision Making  Kenneth [name] is a 71 y.o. male who presents s/p successful R CAS. Based on his angiographic findings, this patient needs: annual B carotid surveillance. I discussed in depth with the patient the nature of atherosclerosis, and emphasized the importance of maximal medical management including strict control of blood pressure, blood glucose, and lipid levels, obtaining regular exercise, and cessation of smoking.  The patient is aware that without maximal medical management the  underlying atherosclerotic disease process will progress, limiting the benefit of any interventions.  Thank you for allowing [place] to participate in this patient's care.  Leonides Sake, MD [facility] [place] Office: [phone] Pager: [phone]

## 2013-02-14 ENCOUNTER — Other Ambulatory Visit (HOSPITAL_COMMUNITY): Payer: Self-pay | Admitting: Internal Medicine

## 2013-02-14 ENCOUNTER — Ambulatory Visit (HOSPITAL_COMMUNITY)
Admission: RE | Admit: 2013-02-14 | Discharge: 2013-02-14 | Disposition: A | Discharge status: Home / Self Care | Payer: Medicare Other | Source: Ambulatory Visit | Attending: Internal Medicine | Admitting: Internal Medicine

## 2013-02-14 DIAGNOSIS — R05 Cough: Secondary | ICD-10-CM | POA: Insufficient documentation

## 2013-02-14 DIAGNOSIS — R059 Cough, unspecified: Secondary | ICD-10-CM

## 2013-07-27 ENCOUNTER — Ambulatory Visit: Payer: Medicare Other | Admitting: Vascular Surgery

## 2013-07-27 ENCOUNTER — Other Ambulatory Visit: Payer: Medicare Other

## 2013-08-02 ENCOUNTER — Encounter: Payer: Self-pay | Admitting: Family

## 2013-08-03 ENCOUNTER — Ambulatory Visit (INDEPENDENT_AMBULATORY_CARE_PROVIDER_SITE_OTHER): Payer: Medicare Other | Admitting: Family

## 2013-08-03 ENCOUNTER — Ambulatory Visit (HOSPITAL_COMMUNITY)
Admission: RE | Admit: 2013-08-03 | Discharge: 2013-08-03 | Disposition: A | Discharge status: Home / Self Care | Payer: Medicare Other | Source: Ambulatory Visit | Attending: Vascular Surgery | Admitting: Vascular Surgery

## 2013-08-03 ENCOUNTER — Encounter: Payer: Self-pay | Admitting: Family

## 2013-08-03 VITALS — BP 139/73 | HR 58 | Resp 14 | Ht 66.0 in | Wt 176.0 lb

## 2013-08-03 DIAGNOSIS — I6529 Occlusion and stenosis of unspecified carotid artery: Secondary | ICD-10-CM | POA: Insufficient documentation

## 2013-08-03 DIAGNOSIS — I658 Occlusion and stenosis of other precerebral arteries: Secondary | ICD-10-CM

## 2013-08-03 DIAGNOSIS — Z48812 Encounter for surgical aftercare following surgery on the circulatory system: Secondary | ICD-10-CM

## 2013-08-03 DIAGNOSIS — I6523 Occlusion and stenosis of bilateral carotid arteries: Secondary | ICD-10-CM

## 2013-08-03 NOTE — Progress Notes (Signed)
Established Carotid Patient  History of Present Illness  Kenneth [name] is a 73 y.o. male patient of Dr. Bridgett Larsson who is s/p right carotid artery stent on 06/13/12.  He returns today for Duplex surveillance and evaluation. He reports 3-4 TIA's , the last of which was 4-5 years ago as manifested by left hand numbness that resolved in a few hours, denies any residual problem in his left hand. The patient denies amaurosis fugax or monocular blindness.  The patient  denies facial drooping.  Pt. denies hemiplegia.  The patient denies receptive or expressive aphasia.  Pt. denies extremity weakness.   Patient denies New Medical or Surgical History.  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 He has lumbar spine issues, denies claudication symptoms, denies non-healing wounds.  Pt Diabetic: No Pt smoker: non-smoker  Pt meds include: Statin : No: allergic to all statins ASA: Yes Other anticoagulants/antiplatelets: Plavix   Past Medical History  Diagnosis Date  . Hypertension   . Stroke   . ASCVD (arteriosclerotic cardiovascular disease)   . GERD (gastroesophageal reflux disease)   . Abnormal chest x-ray   . Tobacco abuse   . Anxiety   . Depression   . High triglycerides   . Laryngeal carcinoma   . Nephrolithiasis   . Acute renal failure   . Coronary artery disease   . Peripheral vascular disease     Social History History  Substance Use Topics  . Smoking status: Former Smoker -- 1.00 packs/day for 12 years    Types: Cigarettes    Quit date: 07/26/1961  . Smokeless tobacco: Former Systems developer    Quit date: 05/05/2002  . Alcohol Use: No    Family History Family History  Problem Relation Age of Onset  . Dementia Mother   . Heart disease Father   . Colon cancer Neg Hx   . Cancer Brother   . Heart disease Brother   . Hyperlipidemia Brother   . Cancer  Brother     Surgical History Past Surgical History  Procedure Laterality Date  . Partial laryngectomy  1998  . Coronary artery bypass graft    . Colonoscopy  11/10/2011    Procedure: COLONOSCOPY;  Surgeon: Daneil Dolin, MD;  Location: [facility];  Service: Endoscopy;  Laterality: N/A;  8:30 AM  . Eye surgery    . Cardiac surgery    . Cholecystectomy  2007    Gall Bladder  . Carotid stent Right 06-13-12    Allergies  Allergen Reactions  . Erythromycin   . Lorazepam Other (See Comments)    REACTION: Alters mental status. "Turns into maniac"  . Penicillins Hives  . Sulfonamide Derivatives Other (See Comments)    REACTION: Unknown to patient. States that MD states allergy per medical records  . Statins Itching and Rash    Current Outpatient Prescriptions  Medication Sig Dispense Refill  . aspirin 81 MG chewable tablet Chew 1 tablet (81 mg total) by mouth daily.  30 tablet  12  . clopidogrel (PLAVIX) 75 MG tablet Take 75 mg by mouth at bedtime.       . famotidine (PEPCID) 10 MG tablet Take 10 mg by mouth 2 (two) times daily.      . famotidine (PEPCID) 40 MG tablet       . Omega-3 Fatty Acids (FISH OIL) 1200 MG CAPS Take 1,200 mg by mouth 3 (three)  times daily.      Marland Kitchen PARoxetine (PAXIL) 10 MG tablet Take 10 mg by mouth at bedtime.       . prednisoLONE acetate (PRED FORTE) 1 % ophthalmic suspension       . ramipril (ALTACE) 2.5 MG capsule Take 1 capsule (2.5 mg total) by mouth daily.  30 capsule  12  . nitroGLYCERIN (NITROSTAT) 0.4 MG SL tablet Place 1 tablet (0.4 mg total) under the tongue every 5 (five) minutes as needed for chest pain.  25 tablet  12   No current facility-administered medications for this visit.    Review of Systems : See HPI for pertinent positives and negatives.  Physical Examination  Filed Vitals:   08/03/13 1038  BP: 139/73  Pulse: 58  Resp: 14   Filed Weights   08/03/13 1038  Weight: 176 lb (79.833 kg)   Body mass index is 28.42  kg/(m^2).   General: WDWN male in NAD GAIT: normal Eyes: PERRLA Pulmonary:  CTAB, Negative  Rales, Negative rhonchi, & Negative wheezing. Voice is in a whisper.  Cardiac: regular Rhythm ,  Negative Murmurs.  VASCULAR EXAM Carotid Bruits Left Right   Negative Negative     Radial pulses are 3+ palpable and equal.                                                                                                                            LE Pulses LEFT RIGHT       POPLITEAL  not palpable   not palpable       POSTERIOR TIBIAL   palpable    palpable        DORSALIS PEDIS      ANTERIOR TIBIAL not palpable   palpable     Gastrointestinal: soft, nontender, BS WNL, no r/g,  negative masses.  Musculoskeletal: Negative muscle atrophy/wasting. M/S 5/5 throughout, Extremities without ischemic changes.  Neurologic: A&O X 3; Appropriate Affect ; SENSATION ;normal;  Speech is in a raspy whisper, CN 2-12 intact  Except for hearing loss, Pain and light touch intact in extremities, Motor exam as listed above.   Non-Invasive Vascular Imaging CAROTID DUPLEX 08/03/2013   Right ICA: patent stent. Left ICA: 40 - 59 % stenosis. Right CCA stenosis <50% Right ECA stenosis.  These findings are Unchanged from previous exam.  Assessment: Kenneth [name] is a 73 y.o. male who presents s/p right carotid artery stent on 06/13/12 with asymptomatic patent right ICA stent and 40 - 59 % Left ICA  Stenosis. Right common carotid artery stenosis is <50%. The  ICA stenosis is  Unchanged from previous exam a year ago.  Plan: Follow-up in 1 year with Carotid Duplex scan.   I discussed in depth with the patient the nature of atherosclerosis, and emphasized the importance of maximal medical management including strict control of blood pressure, blood glucose, and lipid levels, obtaining regular exercise, and cessation of smoking.  The patient is aware that without  maximal medical management the underlying  atherosclerotic disease process will progress, limiting the benefit of any interventions. The patient was given information about stroke prevention and what symptoms should prompt the patient to seek immediate medical care. Thank you for allowing [place] to participate in this patient's care.  Clemon Chambers, RN, MSN, FNP-C [facility] Office: [facility] Physician: Bridgett Larsson  08/03/2013 10:43 AM  VASCULAR QUALITY INITIATIVE FOLLOW UP DATA:  Current smoker: [  ] yes  [ X ] no  Living status: [ X ]  Home  [  ] Nursing home  [  ] Homeless    MEDS:  ASA Valu.Nieves  ] yes  [  ] no- [  ] medical reason  [  ] non compliant  STATIN  [  ] yes  [ X ] no- [ X ] medical reason  [  ] non compliant  Beta blocker [  ] yes  [ X ] no- [ X ] medical reason  [  ] non compliant  ACE inhibitor Valu.Nieves  ] yes  [  ] no- [  ] medical reason  [  ] non compliant  P2Y12 Antagonist [  ] none  [ X ] clopidogrel-Plavix  [  ] ticlopidine-Ticlid   [  ] prasugrel-Effient  [  ] ticagrelor- Brilinta    Anticoagulant [ X ] None  [  ] warfarin  [  ] rivaroxaban-Xarelto [  ] dabigatran- Pradaxa  Neurologic event since D/C:  Valu.Nieves  ] no  [  ] yes: [  ] eye event  [  ] cortical event  [  ] VB event  [  ] non specific event  [  ] right  [  ] left  [  ] TIA  [  ] stroke  Date:   Modified Rankin Score: 0

## 2013-08-03 NOTE — Addendum Note (Signed)
Addended by: Mena Goes on: 08/03/2013 02:05 PM   Modules accepted: Orders

## 2013-08-03 NOTE — Patient Instructions (Signed)
Stroke Prevention Some medical conditions and behaviors are associated with an increased chance of having a stroke. You may prevent a stroke by making healthy choices and managing medical conditions. Reduce your risk of having a stroke by:  Staying physically active. Get at least 30 minutes of activity on most or all days.  Not smoking. It may also be helpful to avoid exposure to secondhand smoke.  Limiting alcohol use. Moderate alcohol use is considered to be:  No more than 2 drinks per day for men.  No more than 1 drink per day for nonpregnant women.  Eating healthy foods.  Include 5 or more servings of fruits and vegetables a day.  Certain diets may be prescribed to address high blood pressure, high cholesterol, diabetes, or obesity.  Managing your cholesterol levels.  A low-saturated fat, low-trans fat, low-cholesterol, and high-fiber diet may control cholesterol levels.  Take any prescribed medicines to control cholesterol as directed by your caregiver.  Managing your diabetes.  A controlled-carbohydrate, controlled-sugar diet is recommended to manage diabetes.  Take any prescribed medicines to control diabetes as directed by your caregiver.  Controlling your high blood pressure (hypertension).  A low-salt (sodium), low-saturated fat, low-trans fat, and low-cholesterol diet is recommended to manage high blood pressure.  Take any prescribed medicines to control hypertension as directed by your caregiver.  Maintaining a healthy weight.  A reduced-calorie, low-sodium, low-saturated fat, low-trans fat, low-cholesterol diet is recommended to manage weight.  Stopping drug abuse.  Avoiding birth control pills.  Talk to your caregiver about the risks of taking birth control pills if you are over 35 years old, smoke, get migraines, or have ever had a blood clot.  Getting evaluated for sleep disorders (sleep apnea).  Talk to your caregiver about getting a sleep evaluation  if you snore a lot or have excessive sleepiness.  Taking medicines as directed by your caregiver.  For some people, aspirin or blood thinners (anticoagulants) are helpful in reducing the risk of forming abnormal blood clots that can lead to stroke. If you have the irregular heart rhythm of atrial fibrillation, you should be on a blood thinner unless there is a good reason you cannot take them.  Understand all your medicine instructions. SEEK IMMEDIATE MEDICAL CARE IF:   You have sudden weakness or numbness of the face, arm, or leg, especially on one side of the body.  You have sudden confusion.  You have trouble speaking (aphasia) or understanding.  You have sudden trouble seeing in one or both eyes.  You have sudden trouble walking.  You have dizziness.  You have a loss of balance or coordination.  You have a sudden, severe headache with no known cause.  You have new chest pain or an irregular heartbeat. Any of these symptoms may represent a serious problem that is an emergency. Do not wait to see if the symptoms will go away. Get medical help right away. Call your local emergency services (911 in [place] Do not drive yourself to the hospital. Document Released: 08/19/2004 Document Revised: 10/04/2011 Document Reviewed: 01/12/2013 [employer] Patient Information 2014 [employer].  

## 2013-11-02 ENCOUNTER — Encounter: Payer: Self-pay | Admitting: Cardiology

## 2013-11-02 ENCOUNTER — Ambulatory Visit (HOSPITAL_COMMUNITY)
Admission: RE | Admit: 2013-11-02 | Discharge: 2013-11-02 | Disposition: A | Discharge status: Home / Self Care | Payer: Medicare Other | Source: Ambulatory Visit | Attending: Cardiology | Admitting: Cardiology

## 2013-11-02 ENCOUNTER — Encounter (INDEPENDENT_AMBULATORY_CARE_PROVIDER_SITE_OTHER): Payer: Self-pay

## 2013-11-02 ENCOUNTER — Ambulatory Visit (INDEPENDENT_AMBULATORY_CARE_PROVIDER_SITE_OTHER): Payer: Medicare Other | Admitting: Cardiology

## 2013-11-02 VITALS — BP 140/74 | HR 65 | Ht 66.0 in | Wt 178.0 lb

## 2013-11-02 DIAGNOSIS — I25810 Atherosclerosis of coronary artery bypass graft(s) without angina pectoris: Secondary | ICD-10-CM

## 2013-11-02 DIAGNOSIS — R0602 Shortness of breath: Secondary | ICD-10-CM

## 2013-11-02 DIAGNOSIS — I10 Essential (primary) hypertension: Secondary | ICD-10-CM

## 2013-11-02 NOTE — Progress Notes (Signed)
Clinical Summary [name] is a 73 y.o.male former patient of Dr Kenneth [name], this is our first visit together. He is seen for the following medical problems.   1. CAD - prior CABG in 2003, repeat cath 2007 with patent grafts.  - 07/2011 MPI low risk study, no ischemia.  - echo 04/2012 LVEF 65-60%, grade II diastolic dysfunction  - denies any chest pain. Has some DOE with activity. Can walk long distances on flat ground, elevation causes him to be very SOB that is worsening. - no orthopnea, no LE edema - compliant with meds   2. Hyperlipidemia - reported history of rash on statin, he reports tried multiple and all caused rash  3. HTN - compliant with meds - does not check regularly  4. Carotid stenosis - prior carotid stenting - followed by vascular  Past Medical History  Diagnosis Date  . Hypertension   . Stroke   . ASCVD (arteriosclerotic cardiovascular disease)   . GERD (gastroesophageal reflux disease)   . Abnormal chest x-ray   . Tobacco abuse   . Anxiety   . Depression   . High triglycerides   . Laryngeal carcinoma   . Nephrolithiasis   . Acute renal failure   . Coronary artery disease   . Peripheral vascular disease      Allergies  Allergen Reactions  . Erythromycin   . Lorazepam Other (See Comments)    REACTION: Alters mental status. "Turns into maniac"  . Penicillins Hives  . Sulfonamide Derivatives Other (See Comments)    REACTION: Unknown to patient. States that MD states allergy per medical records  . Statins Itching and Rash     Current Outpatient Prescriptions  Medication Sig Dispense Refill  . aspirin 81 MG chewable tablet Chew 1 tablet (81 mg total) by mouth daily.  30 tablet  12  . clopidogrel (PLAVIX) 75 MG tablet Take 75 mg by mouth at bedtime.       . famotidine (PEPCID) 10 MG tablet Take 10 mg by mouth 2 (two) times daily.      . famotidine (PEPCID) 40 MG tablet       . nitroGLYCERIN (NITROSTAT) 0.4 MG SL tablet Place 1 tablet  (0.4 mg total) under the tongue every 5 (five) minutes as needed for chest pain.  25 tablet  12  . Omega-3 Fatty Acids (FISH OIL) 1200 MG CAPS Take 1,200 mg by mouth 3 (three) times daily.      Marland Kitchen PARoxetine (PAXIL) 10 MG tablet Take 10 mg by mouth at bedtime.       . prednisoLONE acetate (PRED FORTE) 1 % ophthalmic suspension       . ramipril (ALTACE) 2.5 MG capsule Take 1 capsule (2.5 mg total) by mouth daily.  30 capsule  12   No current facility-administered medications for this visit.     Past Surgical History  Procedure Laterality Date  . Partial laryngectomy  1998  . Coronary artery bypass graft    . Colonoscopy  11/10/2011    Procedure: COLONOSCOPY;  Surgeon: Kenneth Dolin, MD;  Location: [facility];  Service: Endoscopy;  Laterality: N/A;  8:30 AM  . Eye surgery    . Cardiac surgery    . Cholecystectomy  2007    Gall Bladder  . Carotid stent Right 06-13-12     Allergies  Allergen Reactions  . Erythromycin   . Lorazepam Other (See Comments)    REACTION: Alters mental status. "Turns into maniac"  .  Penicillins Hives  . Sulfonamide Derivatives Other (See Comments)    REACTION: Unknown to patient. States that MD states allergy per medical records  . Statins Itching and Rash      Family History  Problem Relation Age of Onset  . Dementia Mother   . Heart disease Father   . Colon cancer Neg Hx   . Cancer Brother   . Heart disease Brother   . Hyperlipidemia Brother   . Cancer Brother      Social History [name] reports that he quit smoking about 52 years ago. His smoking use included Cigarettes. He has a 12 pack-year smoking history. He quit smokeless tobacco use about 11 years ago. [name] reports that he does not drink alcohol.   Review of Systems CONSTITUTIONAL: No weight loss, fever, chills, weakness or fatigue.  HEENT: Eyes: No visual loss, blurred vision, double vision or yellow sclerae.No hearing loss, sneezing, congestion, runny nose or  sore throat.  SKIN: No rash or itching.  CARDIOVASCULAR: per HPI RESPIRATORY: No cough or sputum.  GASTROINTESTINAL: No anorexia, nausea, vomiting or diarrhea. No abdominal pain or blood.  GENITOURINARY: No burning on urination, no polyuria NEUROLOGICAL: No headache, dizziness, syncope, paralysis, ataxia, numbness or tingling in the extremities. No change in bowel or bladder control.  MUSCULOSKELETAL: No muscle, back pain, joint pain or stiffness.  LYMPHATICS: No enlarged nodes. No history of splenectomy.  PSYCHIATRIC: No history of depression or anxiety.  ENDOCRINOLOGIC: No reports of sweating, cold or heat intolerance. No polyuria or polydipsia.  Marland Kitchen   Physical Examination p 65 bp 140/74 Wt 178 lbs BMI 29 Gen: resting comfortably, no acute distress HEENT: no scleral icterus, pupils equal round and reactive, no palptable cervical adenopathy,  CV: RRR< no m/r/g, no JVD, no carotid bruits Resp: Clear to auscultation bilaterally GI: abdomen is soft, non-tender, non-distended, normal bowel sounds, no hepatosplenomegaly MSK: extremities are warm, no edema.  Skin: warm, no rash Neuro:  no focal deficits Psych: appropriate affect   Diagnostic Studies 07/2011 MPI Tomographic views were obtained using the short axis, vertical long axis, and horizontal long axis planes. No significant, reversible perfusion defects are noted to indicate ischemia.  Gated imaging reveals an EDV of 59, ESV of 18, T I D ratio of 0.80, and LVEF of 69%.  IMPRESSION: Low risk exercise/Lexiscan Myoview as outlined. No diagnostic ST- segment changes or arrhythmias were noted. There is evidence of soft tissue attenuation, however no frank scar or ischemia. LVEF is normal at 69%.  04/2012 Echo LVEF 65-70%, grade II diastolic dysfunction,     Assessment and Plan   1. CAD - no current chest pain, does describe some worsening DOE mainly with walking at incline - obtain echo as initial evaluation in setting of  progressing DOE, may need ishemic evaluation pending results  2. Hyperlipidemia - not tolerant of statins, currently not on therapy  3. HTN - at goal, continue current meds  4. Carotid stenosis - continue regular vascular f/u  F/u 1 month     Kenneth [name], M.D., F.A.C.C.

## 2013-11-02 NOTE — Patient Instructions (Signed)
Your physician recommends that you schedule a follow-up appointment in: 1 month  Your physician recommends that you continue on your current medications as directed. Please refer to the Current Medication list given to you today.   Your physician has requested that you have an echocardiogram. Echocardiography is a painless test that uses sound waves to create images of your heart. It provides your doctor with information about the size and shape of your heart and how well your heart's chambers and valves are working. This procedure takes approximately one hour. There are no restrictions for this procedure.   Please get a chest x-ray today    Thank you for choosing [facility] !

## 2013-11-05 ENCOUNTER — Ambulatory Visit (HOSPITAL_COMMUNITY)
Admission: RE | Admit: 2013-11-05 | Discharge: 2013-11-05 | Disposition: A | Discharge status: Home / Self Care | Payer: Medicare Other | Source: Ambulatory Visit | Attending: Cardiology | Admitting: Cardiology

## 2013-11-05 DIAGNOSIS — I2510 Atherosclerotic heart disease of native coronary artery without angina pectoris: Secondary | ICD-10-CM | POA: Insufficient documentation

## 2013-11-05 DIAGNOSIS — I10 Essential (primary) hypertension: Secondary | ICD-10-CM | POA: Insufficient documentation

## 2013-11-05 DIAGNOSIS — Z87891 Personal history of nicotine dependence: Secondary | ICD-10-CM | POA: Insufficient documentation

## 2013-11-05 DIAGNOSIS — I517 Cardiomegaly: Secondary | ICD-10-CM

## 2013-11-05 DIAGNOSIS — E785 Hyperlipidemia, unspecified: Secondary | ICD-10-CM | POA: Insufficient documentation

## 2013-11-05 DIAGNOSIS — R0602 Shortness of breath: Secondary | ICD-10-CM | POA: Insufficient documentation

## 2013-11-05 NOTE — Progress Notes (Signed)
*  PRELIMINARY RESULTS* Echocardiogram 2D Echocardiogram has been performed.  Kenneth [name] 11/05/2013, 10:24 AM

## 2013-11-07 ENCOUNTER — Telehealth: Payer: Self-pay | Admitting: *Deleted

## 2013-11-07 NOTE — Telephone Encounter (Signed)
Patient calling for echo results 

## 2013-11-07 NOTE — Telephone Encounter (Signed)
Pt is calling for results of ultra sound that was done monday

## 2013-11-30 ENCOUNTER — Encounter: Payer: Self-pay | Admitting: *Deleted

## 2013-11-30 ENCOUNTER — Ambulatory Visit (INDEPENDENT_AMBULATORY_CARE_PROVIDER_SITE_OTHER): Payer: Medicare Other | Admitting: Cardiology

## 2013-11-30 ENCOUNTER — Encounter: Payer: Self-pay | Admitting: Cardiology

## 2013-11-30 VITALS — BP 155/82 | HR 53 | Ht 66.0 in | Wt 178.0 lb

## 2013-11-30 DIAGNOSIS — R0602 Shortness of breath: Secondary | ICD-10-CM

## 2013-11-30 DIAGNOSIS — I25810 Atherosclerosis of coronary artery bypass graft(s) without angina pectoris: Secondary | ICD-10-CM

## 2013-11-30 DIAGNOSIS — R0600 Dyspnea, unspecified: Secondary | ICD-10-CM

## 2013-11-30 DIAGNOSIS — R0989 Other specified symptoms and signs involving the circulatory and respiratory systems: Secondary | ICD-10-CM

## 2013-11-30 DIAGNOSIS — I10 Essential (primary) hypertension: Secondary | ICD-10-CM

## 2013-11-30 DIAGNOSIS — R0609 Other forms of dyspnea: Secondary | ICD-10-CM

## 2013-11-30 NOTE — Progress Notes (Signed)
Clinical Summary Kenneth [name] is a 73 y.o.male seen today for follow up of the following medical problems.   1. CAD  - prior CABG in 2003, repeat cath 2007 with patent grafts.  - 07/2011 MPI low risk study, no ischemia.  - echo 04/2012 LVEF 65-60%, grade II diastolic dysfunction   - denies any chest pain. Has some DOE with activity. Can walk long distances on flat ground, elevation causes him to be very SOB that is worsening. This has progressed since I last saw him.  - no orthopnea, no LE edema  - compliant with meds   - since last visit completed echo which showed LVEF 65-70%, grade II diastolic dysfunction. Test was ordered due to reported worsening DOE.   2. Hyperlipidemia  - reported history of rash on statin, he reports tried multiple and all caused rash   3. HTN  - compliant with meds  - does not check regularly   4. Carotid stenosis  - prior carotid stenting  - followed by vascular  Past Medical History  Diagnosis Date  . Hypertension   . Stroke   . ASCVD (arteriosclerotic cardiovascular disease)   . GERD (gastroesophageal reflux disease)   . Abnormal chest x-ray   . Tobacco abuse   . Anxiety   . Depression   . High triglycerides   . Laryngeal carcinoma   . Nephrolithiasis   . Acute renal failure   . Coronary artery disease   . Peripheral vascular disease      Allergies  Allergen Reactions  . Erythromycin   . Lorazepam Other (See Comments)    REACTION: Alters mental status. "Turns into maniac"  . Penicillins Hives  . Sulfonamide Derivatives Other (See Comments)    REACTION: Unknown to patient. States that MD states allergy per medical records  . Statins Itching and Rash     Current Outpatient Prescriptions  Medication Sig Dispense Refill  . clopidogrel (PLAVIX) 75 MG tablet Take 75 mg by mouth at bedtime.       . famotidine (PEPCID) 40 MG tablet Take 40 mg by mouth 2 (two) times daily.       . nitroGLYCERIN (NITROSTAT) 0.4 MG SL tablet Place  1 tablet (0.4 mg total) under the tongue every 5 (five) minutes as needed for chest pain.  25 tablet  12  . Omega-3 Fatty Acids (FISH OIL) 1200 MG CAPS Take 1,200 mg by mouth 3 (three) times daily.      Marland Kitchen PARoxetine (PAXIL) 10 MG tablet Take 10 mg by mouth at bedtime.       . prednisoLONE acetate (PRED FORTE) 1 % ophthalmic suspension       . valsartan (DIOVAN) 160 MG tablet Take 160 mg by mouth daily.        No current facility-administered medications for this visit.     Past Surgical History  Procedure Laterality Date  . Partial laryngectomy  1998  . Coronary artery bypass graft    . Colonoscopy  11/10/2011    Procedure: COLONOSCOPY;  Surgeon: Daneil Dolin, MD;  Location: [facility];  Service: Endoscopy;  Laterality: N/A;  8:30 AM  . Eye surgery    . Cardiac surgery    . Cholecystectomy  2007    Gall Bladder  . Carotid stent Right 06-13-12     Allergies  Allergen Reactions  . Erythromycin   . Lorazepam Other (See Comments)    REACTION: Alters mental status. "Turns into maniac"  .  Penicillins Hives  . Sulfonamide Derivatives Other (See Comments)    REACTION: Unknown to patient. States that MD states allergy per medical records  . Statins Itching and Rash      Family History  Problem Relation Age of Onset  . Dementia Mother   . Heart disease Father   . Colon cancer Neg Hx   . Cancer Brother   . Heart disease Brother   . Hyperlipidemia Brother   . Cancer Brother      Social History Mr. Henson reports that he quit smoking about 52 years ago. His smoking use included Cigarettes. He has a 12 pack-year smoking history. He quit smokeless tobacco use about 11 years ago. Mr. Remmers reports that he does not drink alcohol.   Review of Systems CONSTITUTIONAL: No weight loss, fever, chills, weakness or fatigue.  HEENT: Eyes: No visual loss, blurred vision, double vision or yellow sclerae.No hearing loss, sneezing, congestion, runny nose or sore throat.  SKIN:  No rash or itching.  CARDIOVASCULAR: per HPI RESPIRATORY: No shortness of breath, cough or sputum.  GASTROINTESTINAL: No anorexia, nausea, vomiting or diarrhea. No abdominal pain or blood.  GENITOURINARY: No burning on urination, no polyuria NEUROLOGICAL: No headache, dizziness, syncope, paralysis, ataxia, numbness or tingling in the extremities. No change in bowel or bladder control.  MUSCULOSKELETAL: No muscle, back pain, joint pain or stiffness.  LYMPHATICS: No enlarged nodes. No history of splenectomy.  PSYCHIATRIC: No history of depression or anxiety.  ENDOCRINOLOGIC: No reports of sweating, cold or heat intolerance. No polyuria or polydipsia.  Marland Kitchen   Physical Examination p 53 bp 155/82 Wt 178 lbs BMI 29 Gen: resting comfortably, no acute distress HEENT: no scleral icterus, pupils equal round and reactive, no palptable cervical adenopathy,  CV: RRR, no m/r/g, no JVD, no carotid bruits Resp: Clear to auscultation bilaterally GI: abdomen is soft, non-tender, non-distended, normal bowel sounds, no hepatosplenomegaly MSK: extremities are warm, no edema.  Skin: warm, no rash Neuro:  no focal deficits Psych: appropriate affect   Diagnostic Studies 07/2011 MPI  Tomographic views were obtained using the short axis, vertical long axis, and horizontal long axis planes. No significant, reversible perfusion defects are noted to indicate ischemia.  Gated imaging reveals an EDV of 59, ESV of 18, T I D ratio of 0.80, and LVEF of 69%.  IMPRESSION: Low risk exercise/Lexiscan Myoview as outlined. No diagnostic ST- segment changes or arrhythmias were noted. There is evidence of soft tissue attenuation, however no frank scar or ischemia. LVEF is normal at 69%.  04/2012 Echo  LVEF 65-70%, grade II diastolic dysfunction,   03/12/28 Echo Study Conclusions  - Left ventricle: The cavity size was normal. Wall thickness was increased in a pattern of mild LVH. Systolic function was vigorous. The  estimated ejection fraction was in the range of 65% to 70%. Wall motion was normal; there were no regional wall motion abnormalities. Features are consistent with a pseudonormal left ventricular filling pattern, with concomitant abnormal relaxation and increased filling pressure (grade 2 diastolic dysfunction). - Aortic valve: Mildly calcified annulus. Trileaflet. Trivial regurgitation. - Mitral valve: Calcified annulus. Trivial regurgitation. - Left atrium: The atrium was mildly dilated. - Right ventricle: Systolic function was low normal. - Right atrium: Central venous pressure: 40mm Hg (est). - Atrial septum: No defect or patent foramen ovale was identified. - Tricuspid valve: Trivial regurgitation. - Pulmonary arteries: Systolic pressure could not be accurately estimated. - Pericardium, extracardiac: There was no pericardial effusion. Impressions:  - Normal LV chamber  size with mild LVH and LVEF 51-70%, grade 2 diastolic dysfunction. MIld left atrial enlargement. Mild MAC. Low normal RV contraction. Unable to assess PASP - trivial tricuspid regurgitation.      Assessment and Plan  1. CAD  - no current chest pain, but has had worsening DOE that may be an anginal equivalent - will order Lexiscan  2. Hyperlipidemia  - not tolerant of statins, currently not on therapy   3. HTN  - at goal, continue current meds   4. Carotid stenosis  - continue regular vascular f/u       Arnoldo Lenis, M.D., F.A.C.C.

## 2013-11-30 NOTE — Patient Instructions (Signed)
Your physician recommends that you schedule a follow-up appointment in:  1 month    Your physician has requested that you have a lexiscan myoview. For further information please visit [email or web address]. Please follow instruction sheet, as given.     Your physician recommends that you continue on your current medications as directed. Please refer to the Current Medication list given to you today.    Thank you for choosing [facility] !   

## 2013-12-03 ENCOUNTER — Encounter (HOSPITAL_COMMUNITY)
Admission: RE | Admit: 2013-12-03 | Discharge: 2013-12-03 | Disposition: A | Discharge status: Home / Self Care | Payer: Medicare Other | Source: Ambulatory Visit | Attending: Cardiology | Admitting: Cardiology

## 2013-12-03 ENCOUNTER — Encounter (HOSPITAL_COMMUNITY): Payer: Self-pay

## 2013-12-03 DIAGNOSIS — R0600 Dyspnea, unspecified: Secondary | ICD-10-CM

## 2013-12-03 DIAGNOSIS — R0609 Other forms of dyspnea: Secondary | ICD-10-CM | POA: Insufficient documentation

## 2013-12-03 DIAGNOSIS — R0602 Shortness of breath: Secondary | ICD-10-CM

## 2013-12-03 DIAGNOSIS — R0989 Other specified symptoms and signs involving the circulatory and respiratory systems: Secondary | ICD-10-CM | POA: Insufficient documentation

## 2013-12-03 MED ORDER — SODIUM CHLORIDE 0.9 % IJ SOLN
INTRAMUSCULAR | Status: AC
  Administered 2013-12-03: 10 mL via INTRAVENOUS
  Filled 2013-12-03: qty 10

## 2013-12-03 MED ORDER — TECHNETIUM TC 99M SESTAMIBI - CARDIOLITE
10.0000 | Freq: Once | INTRAVENOUS | Status: AC | PRN
  Administered 2013-12-03: 10 via INTRAVENOUS

## 2013-12-03 MED ORDER — TECHNETIUM TC 99M SESTAMIBI GENERIC - CARDIOLITE
30.0000 | Freq: Once | INTRAVENOUS | Status: AC | PRN
  Administered 2013-12-03: 30 via INTRAVENOUS

## 2013-12-03 MED ORDER — REGADENOSON 0.4 MG/5ML IV SOLN
INTRAVENOUS | Status: AC
  Administered 2013-12-03: 0.4 mg via INTRAVENOUS
  Filled 2013-12-03: qty 5

## 2013-12-03 NOTE — Progress Notes (Signed)
Stress Lab Nurses Notes - Kenneth [name]  Kenneth [name] 12/03/2013 Reason for doing test: CAD and Valvular Disease Type of test: Wille Glaser Nurse performing test: Kenneth Halls, RN Nuclear Medicine Tech: Kenneth [name] Echo Tech: Not Applicable MD performing test: Branch/K.Lawrence NP Family BP:PHKFE Test explained and consent signed: yes IV started: 22g jelco, Saline lock flushed, No redness or edema and Saline lock started in radiology Symptoms: chest pain, & nausea Treatment/Intervention: None Reason test stopped: protocol completed After recovery IV was: Discontinued via X-ray tech and No redness or edema Patient to return to [place]. Med at :12:15 Patient discharged: Home Patient's Condition upon discharge was: stable Comments: During test BP 150/78 & HR 93.  Recovery BP 128/82 & HR 65.  Symptoms resolved in recovery. Kenneth [name]

## 2013-12-13 ENCOUNTER — Ambulatory Visit: Payer: Medicare Other | Admitting: Cardiology

## 2013-12-31 ENCOUNTER — Encounter: Payer: Medicare Other | Admitting: Adult Health

## 2014-02-01 ENCOUNTER — Ambulatory Visit (HOSPITAL_COMMUNITY)
Admission: RE | Admit: 2014-02-01 | Discharge: 2014-02-01 | Disposition: A | Discharge status: Home / Self Care | Payer: Medicare Other | Source: Ambulatory Visit | Attending: Internal Medicine | Admitting: Internal Medicine

## 2014-02-01 ENCOUNTER — Other Ambulatory Visit (HOSPITAL_COMMUNITY): Payer: Self-pay | Admitting: Internal Medicine

## 2014-02-01 DIAGNOSIS — M25539 Pain in unspecified wrist: Secondary | ICD-10-CM | POA: Insufficient documentation

## 2014-02-01 DIAGNOSIS — M25531 Pain in right wrist: Secondary | ICD-10-CM

## 2014-02-12 ENCOUNTER — Encounter: Payer: Self-pay | Admitting: *Deleted

## 2014-02-12 ENCOUNTER — Ambulatory Visit: Payer: Medicare Other | Admitting: Orthopedic Surgery

## 2014-03-25 ENCOUNTER — Encounter: Payer: Self-pay | Admitting: *Deleted

## 2014-04-02 ENCOUNTER — Encounter: Payer: Self-pay | Admitting: Orthopedic Surgery

## 2014-04-02 ENCOUNTER — Ambulatory Visit (INDEPENDENT_AMBULATORY_CARE_PROVIDER_SITE_OTHER): Payer: Medicare Other | Admitting: Orthopedic Surgery

## 2014-04-02 VITALS — BP 145/80 | Ht 66.0 in | Wt 178.0 lb

## 2014-04-02 DIAGNOSIS — M6530 Trigger finger, unspecified finger: Secondary | ICD-10-CM

## 2014-04-02 DIAGNOSIS — M25331 Other instability, right wrist: Secondary | ICD-10-CM

## 2014-04-02 DIAGNOSIS — M24139 Other articular cartilage disorders, unspecified wrist: Secondary | ICD-10-CM

## 2014-04-02 NOTE — Progress Notes (Signed)
New patient with new problem  Chief Complaint  Patient presents with  . Wrist Pain    right wrist pain since lifting piece of of fencing 01/2014, REF. DR Willey Blade + right middle trigger finger    Right long finger locking every night with severe pain x1 month  He also complains of pain over the ulnar side of his right wrist after picking up a heavy object. He felt pain without a pop and has continued swelling. Aching. Constant. 6/10. X-ray wrist negative. He did wear brace for a couple of days off and on but nothing consistent   Past Medical History  Diagnosis Date  . Hypertension   . Stroke   . ASCVD (arteriosclerotic cardiovascular disease)   . GERD (gastroesophageal reflux disease)   . Abnormal chest x-ray   . Tobacco abuse   . Anxiety   . Depression   . High triglycerides   . Laryngeal carcinoma   . Nephrolithiasis   . Acute renal failure   . Coronary artery disease   . Peripheral vascular disease    The past, family history and social history have been reviewed and are recorded in the corresponding sections of epic  Review of systems has been recorded reviewed and signed and scanned into the chart   BP 145/80  Ht 5\' 6"  (1.676 m)  Wt 178 lb (80.74 kg)  BMI 28.74 kg/m2 General appearance is normal is oriented x3 his mood and affect is normal his gait and station is unremarkable. Has good distal pulses normal temperature no edema swelling or significant verrucous veins lymph nodes negative sensation is normal pathologic reflexes 90 tendon reflexes normal correlation balance normal  Left upper extremity normal  Lower extremities no contracture subluxation atrophy tremor or skin changes  Right wrist tenderness over the ulnar side with swelling range of motion is normal except for painful ulnar deviation wrist is stable grip strength is normal skin is intact.  There is tenderness over the A1 pulley of the right long finger with catching and locking and requiring manual  release  Diagnoses #1 triangular fibrocartilage traumatic tear  Diagnosis #2 trigger finger right long finger  Inject right trigger finger. Wear brace for wrist injury. Call if no improvement in the triggering   Procedure note trigger finger injection  Diagnosis trigger finger Postop diagnosis trigger finger Procedure injection of trigger finger Finger injected right long finger   Details of procedure: After verbal consent and timeout to confirm site the RLF was injected with 1 cc of 40 mg of Depo-Medrol and 1 cc of 1% lidocaine  The procedure was tolerated well without complication

## 2014-04-02 NOTE — Patient Instructions (Signed)
You have received a steroid shot. 15% of patients experience increased pain at the injection site with in the next 24 hours. This is best treated with ice and tylenol extra strength 2 tabs every 8 hours. If you are still having pain please call the office.    

## 2014-04-17 ENCOUNTER — Encounter: Payer: Self-pay | Admitting: *Deleted

## 2014-07-04 ENCOUNTER — Encounter (HOSPITAL_COMMUNITY): Payer: Self-pay | Admitting: Surgery

## 2014-07-23 ENCOUNTER — Encounter: Payer: Self-pay | Admitting: *Deleted

## 2014-08-02 ENCOUNTER — Encounter: Payer: Self-pay | Admitting: Family

## 2014-08-05 ENCOUNTER — Ambulatory Visit (HOSPITAL_COMMUNITY)
Admission: RE | Admit: 2014-08-05 | Discharge: 2014-08-05 | Disposition: A | Discharge status: Home / Self Care | Payer: Medicare HMO | Source: Ambulatory Visit | Attending: Family | Admitting: Family

## 2014-08-05 ENCOUNTER — Encounter: Payer: Medicare HMO | Admitting: Family

## 2014-08-05 DIAGNOSIS — I6523 Occlusion and stenosis of bilateral carotid arteries: Secondary | ICD-10-CM

## 2014-08-05 DIAGNOSIS — Z48812 Encounter for surgical aftercare following surgery on the circulatory system: Secondary | ICD-10-CM

## 2014-08-06 NOTE — Patient Instructions (Signed)
Dear Kenneth [name],   Your recent  Vascular Lab study on Jan. 11, 2016 indicates: No significant change noted when compared to the previous exam on Jan. 9, 2015. Please follow up in 1 year.          Stroke Prevention Some medical conditions and behaviors are associated with an increased chance of having a stroke. You may prevent a stroke by making healthy choices and managing medical conditions. HOW CAN I REDUCE MY RISK OF HAVING A STROKE?   Stay physically active. Get at least 30 minutes of activity on most or all days.  Do not smoke. It may also be helpful to avoid exposure to secondhand smoke.  Limit alcohol use. Moderate alcohol use is considered to be:  No more than 2 drinks per day for men.  No more than 1 drink per day for nonpregnant women.  Eat healthy foods. This involves:  Eating 5 or more servings of fruits and vegetables a day.  Making dietary changes that address high blood pressure (hypertension), high cholesterol, diabetes, or obesity.  Manage your cholesterol levels.  Making food choices that are high in fiber and low in saturated fat, trans fat, and cholesterol may control cholesterol levels.  Take any prescribed medicines to control cholesterol as directed by your health care provider.  Manage your diabetes.  Controlling your carbohydrate and sugar intake is recommended to manage diabetes.  Take any prescribed medicines to control diabetes as directed by your health care provider.  Control your hypertension.  Making food choices that are low in salt (sodium), saturated fat, trans fat, and cholesterol is recommended to manage hypertension.  Take any prescribed medicines to control hypertension as directed by your health care provider.  Maintain a healthy weight.  Reducing calorie intake and making food choices that are low in sodium, saturated fat, trans fat, and cholesterol are recommended to manage weight.  Stop drug abuse.  Avoid taking  birth control pills.  Talk to your health care provider about the risks of taking birth control pills if you are over 75 years old, smoke, get migraines, or have ever had a blood clot.  Get evaluated for sleep disorders (sleep apnea).  Talk to your health care provider about getting a sleep evaluation if you snore a lot or have excessive sleepiness.  Take medicines only as directed by your health care provider.  For some people, aspirin or blood thinners (anticoagulants) are helpful in reducing the risk of forming abnormal blood clots that can lead to stroke. If you have the irregular heart rhythm of atrial fibrillation, you should be on a blood thinner unless there is a good reason you cannot take them.  Understand all your medicine instructions.  Make sure that other conditions (such as anemia or atherosclerosis) are addressed. SEEK IMMEDIATE MEDICAL CARE IF:   You have sudden weakness or numbness of the face, arm, or leg, especially on one side of the body.  Your face or eyelid droops to one side.  You have sudden confusion.  You have trouble speaking (aphasia) or understanding.  You have sudden trouble seeing in one or both eyes.  You have sudden trouble walking.  You have dizziness.  You have a loss of balance or coordination.  You have a sudden, severe headache with no known cause.  You have new chest pain or an irregular heartbeat. Any of these symptoms may represent a serious problem that is an emergency. Do not wait to see if the symptoms will go  away. Get medical help at once. Call your local emergency services (911 in [place] Do not drive yourself to the hospital. Document Released: 08/19/2004 Document Revised: 11/26/2013 Document Reviewed: 01/12/2013 [facility] Patient Information 2015 [place], [place]. This information is not intended to replace advice given to you by your health care provider. Make sure you discuss any questions you have with your health care  provider.

## 2014-08-07 NOTE — Progress Notes (Signed)
Lab only 

## 2014-08-08 ENCOUNTER — Encounter: Payer: Self-pay | Admitting: Surgery

## 2014-08-19 ENCOUNTER — Ambulatory Visit (INDEPENDENT_AMBULATORY_CARE_PROVIDER_SITE_OTHER): Payer: Medicare HMO | Admitting: Adult Health

## 2014-08-19 ENCOUNTER — Encounter: Payer: Self-pay | Admitting: Adult Health

## 2014-08-19 VITALS — BP 116/70 | HR 67 | Ht 66.0 in | Wt 177.0 lb

## 2014-08-19 DIAGNOSIS — I2510 Atherosclerotic heart disease of native coronary artery without angina pectoris: Secondary | ICD-10-CM

## 2014-08-19 DIAGNOSIS — I6523 Occlusion and stenosis of bilateral carotid arteries: Secondary | ICD-10-CM

## 2014-08-19 NOTE — Progress Notes (Deleted)
Name: Kenneth [name]    DOB: [birth year]  Age: 74 y.o.  MR#: [id number]       PCP:  [name], MD      Insurance: Payor: [employer] MEDICARE / Plan: [employer] MEDICARE HMO/PPO / Product Type: *No Product type* /   CC:    Chief Complaint  Patient presents with  . Coronary Artery Disease  . Hypertension    VS Filed Vitals:   08/19/14 1351  BP: 116/70  Pulse: 67  Height: _0  (1.676 m)  Weight: 177 lb (80.287 kg)  SpO2: 98%    Weights Current Weight  08/19/14 177 lb (80.287 kg)  04/02/14 178 lb (80.74 kg)  11/30/13 178 lb (80.74 kg)    Blood Pressure  BP Readings from Last 3 Encounters:  08/19/14 116/70  04/02/14 145/80  11/30/13 155/82     Admit date:  (Not on file) Last encounter with RMR:  Visit date not found   Allergy Erythromycin; Lorazepam; Penicillins; Sulfonamide derivatives; and Statins  Current Outpatient Prescriptions  Medication Sig Dispense Refill  . aspirin 81 MG tablet Take 81 mg by mouth daily.    . Cyanocobalamin (B-12) 2500 MCG TABS Take 2,500 mg by mouth daily.    Marland Kitchen esomeprazole (NEXIUM) 20 MG capsule Take 40 mg by mouth daily at 12 noon.    . Omega-3 Fatty Acids (FISH OIL) 1200 MG CAPS Take 1,200 mg by mouth 3 (three) times daily.    Marland Kitchen PARoxetine (PAXIL) 10 MG tablet Take 10 mg by mouth at bedtime.     . prednisoLONE acetate (PRED FORTE) 1 % ophthalmic suspension     . valsartan (DIOVAN) 160 MG tablet Take 160 mg by mouth daily.     . nitroGLYCERIN (NITROSTAT) 0.4 MG SL tablet Place 1 tablet (0.4 mg total) under the tongue every 5 (five) minutes as needed for chest pain. 25 tablet 12   No current facility-administered medications for this visit.    Discontinued Meds:    Medications Discontinued During This Encounter  Medication Reason  . clopidogrel (PLAVIX) 75 MG tablet Discontinued by provider  . famotidine (PEPCID) 40 MG tablet Discontinued by provider    Patient Active Problem List   Diagnosis Date Noted  . Aftercare following surgery of the  circulatory system, [place] 08/03/2013  . Occlusion and stenosis of carotid artery without mention of cerebral infarction 07/21/2012  . Preop cardiovascular exam 05/08/2012  . Carotid stenosis, bilateral 05/05/2012  . CAD in native artery 08/13/2011  . NEOPLASM, MALIGNANT, LARYNX, CARCINOMA 12/14/2009  . HYPERLIPIDEMIA 12/14/2009  . CEREBROVASCULAR ACCIDENT 12/14/2009  . GASTROESOPHAGEAL REFLUX DISEASE 12/14/2009  . NEPHROLITHIASIS 12/14/2009    LABS    Component Value Date/Time   NA 141 06/13/2012 0738   NA 138 04/27/2012 1554   NA 140 08/12/2011 1730   K 4.4 06/13/2012 0738   K 4.1 04/27/2012 1554   K 4.2 08/12/2011 1730   CL 104 06/13/2012 0738   CL 101 04/27/2012 1554   CL 102 08/12/2011 1730   CO2 27 04/27/2012 1554   CO2 31 08/12/2011 1730   CO2 28 06/01/2010 1113   GLUCOSE 102* 06/13/2012 0738   GLUCOSE 99 04/27/2012 1554   GLUCOSE 93 08/12/2011 1730   BUN 18 06/13/2012 0738   BUN 15 04/27/2012 1554   BUN 12 08/12/2011 1730   CREATININE 1.10 06/13/2012 0738   CREATININE 1.16 04/27/2012 1554   CREATININE 1.13 08/12/2011 1730   CALCIUM 9.6 04/27/2012 1554   CALCIUM 9.7 08/12/2011 1730  CALCIUM 8.9 06/01/2010 1113   GFRNONAA 61* 04/27/2012 1554   GFRNONAA 63* 08/12/2011 1730   GFRNONAA >60 03/04/2008 1230   GFRAA 70* 04/27/2012 1554   GFRAA 73* 08/12/2011 1730   GFRAA  03/04/2008 1230    >60        The eGFR has been calculated using the MDRD equation. This calculation has not been validated in all clinical   CMP     Component Value Date/Time   NA 141 06/13/2012 0738   K 4.4 06/13/2012 0738   CL 104 06/13/2012 0738   CO2 27 04/27/2012 1554   GLUCOSE 102* 06/13/2012 0738   BUN 18 06/13/2012 0738   CREATININE 1.10 06/13/2012 0738   CALCIUM 9.6 04/27/2012 1554   PROT 7.0 08/12/2011 1730   ALBUMIN 3.6 08/12/2011 1730   AST 18 08/12/2011 1730   ALT 22 08/12/2011 1730   ALKPHOS 111 08/12/2011 1730   BILITOT 0.3 08/12/2011 1730   GFRNONAA 61* 04/27/2012  1554   GFRAA 70* 04/27/2012 1554       Component Value Date/Time   WBC 6.3 04/27/2012 1554   WBC 6.4 08/12/2011 1730   WBC 7.5 06/01/2010 1113   HGB 15.0 06/13/2012 0738   HGB 14.5 04/27/2012 1554   HGB 14.6 08/12/2011 1730   HCT 44.0 06/13/2012 0738   HCT 43.2 04/27/2012 1554   HCT 43.5 08/12/2011 1730   MCV 95.4 04/27/2012 1554   MCV 94.8 08/12/2011 1730   MCV 97.0 06/01/2010 1113    Lipid Panel     Component Value Date/Time   CHOL 181 08/13/2011 0539   TRIG 236* 08/13/2011 0539   HDL 35* 08/13/2011 0539   CHOLHDL 5.2 08/13/2011 0539   VLDL 47* 08/13/2011 0539   LDLCALC 99 08/13/2011 0539    ABG    Component Value Date/Time   TCO2 26 06/13/2012 0738     Lab Results  Component Value Date   TSH 2.601 06/01/2010   BNP (last 3 results) No results for input(s): PROBNP in the last 8760 hours. Cardiac Panel (last 3 results) No results for input(s): CKTOTAL, CKMB, TROPONINI, RELINDX in the last 72 hours.  Iron/TIBC/Ferritin/ %Sat No results found for: IRON, TIBC, FERRITIN, IRONPCTSAT   EKG Orders placed or performed in visit on 11/02/13  . EKG 12-Lead     Prior Assessment and Plan Problem List as of 08/19/2014      Cardiovascular and Mediastinum   CEREBROVASCULAR ACCIDENT   CAD in native artery   Last Assessment & Plan 05/08/2012 Office Visit Written 05/08/2012 11:49 AM by Evans Lance, MD    He has no anginal symptoms and coronary perfusion has been good. His left ventricular function is normal. No additional evaluation is recommended. He will continue his current medical therapy.      Carotid stenosis, bilateral   Occlusion and stenosis of carotid artery without mention of cerebral infarction     Respiratory   NEOPLASM, MALIGNANT, LARYNX, CARCINOMA     Digestive   GASTROESOPHAGEAL REFLUX DISEASE     Genitourinary   NEPHROLITHIASIS     Other   HYPERLIPIDEMIA   Preop cardiovascular exam   Last Assessment & Plan 05/08/2012 Office Visit Written  05/08/2012 11:48 AM by Evans Lance, MD    The patient's surgical risk for pending carotid endarterectomy is low from a cardiovascular perspective. His prior radiation therapy and prior laryngectomy may make the technical aspects of carotid endarterectomy more difficult. He is allowed to proceed with surgery  as scheduled. We will be available if perioperative cardiovascular care is necessary.      Aftercare following surgery of the circulatory system, [facility]       Imaging: No results found.

## 2014-08-19 NOTE — Progress Notes (Signed)
HPI: [name] is a 74 year old patient Dr.Branch, that we follow for ongoing assessment and management of coronary artery disease, with history of CABG in 2003, repeat cardiac catheterization 2007 with patent grafts, hyperlipidemia, hypertension.  Patient also has prior stenting to his carotid arteries.  He was last seen by Dr. Harl [name].  In April 2015.  He was sent to be stable from a cardiac standpoint.    He is doing well and asymptomatic. He is medically compliant. Dr. Willey Blade has taken him off of Plavix one month ago, as he was started on Nexium. He is unable to tolerate statins due to rash. Dr. Willey Blade follows labs for this.   Allergies  Allergen Reactions  . Erythromycin   . Lorazepam Other (See Comments)    REACTION: Alters mental status. "Turns into maniac"  . Penicillins Hives  . Sulfonamide Derivatives Other (See Comments)    REACTION: Unknown to patient. States that MD states allergy per medical records  . Statins Itching and Rash    Current Outpatient Prescriptions  Medication Sig Dispense Refill  . aspirin 81 MG tablet Take 81 mg by mouth daily.    . Cyanocobalamin (B-12) 2500 MCG TABS Take 2,500 mg by mouth daily.    Marland Kitchen esomeprazole (NEXIUM) 20 MG capsule Take 40 mg by mouth daily at 12 noon.    . Omega-3 Fatty Acids (FISH OIL) 1200 MG CAPS Take 1,200 mg by mouth 3 (three) times daily.    Marland Kitchen PARoxetine (PAXIL) 10 MG tablet Take 10 mg by mouth at bedtime.     . prednisoLONE acetate (PRED FORTE) 1 % ophthalmic suspension     . valsartan (DIOVAN) 160 MG tablet Take 160 mg by mouth daily.     . nitroGLYCERIN (NITROSTAT) 0.4 MG SL tablet Place 1 tablet (0.4 mg total) under the tongue every 5 (five) minutes as needed for chest pain. 25 tablet 12   No current facility-administered medications for this visit.    Past Medical History  Diagnosis Date  . Hypertension   . Stroke   . ASCVD (arteriosclerotic cardiovascular disease)   . GERD (gastroesophageal reflux disease)   .  Abnormal chest x-ray   . Tobacco abuse   . Anxiety   . Depression   . High triglycerides   . Laryngeal carcinoma   . Nephrolithiasis   . Acute renal failure   . Coronary artery disease   . Peripheral vascular disease     Past Surgical History  Procedure Laterality Date  . Partial laryngectomy  1998  . Coronary artery bypass graft    . Colonoscopy  11/10/2011    Procedure: COLONOSCOPY;  Surgeon: Daneil Dolin, MD;  Location: [facility];  Service: Endoscopy;  Laterality: N/A;  8:30 AM  . Eye surgery    . Cardiac surgery    . Cholecystectomy  2007    Gall Bladder  . Carotid stent Right 06-13-12  . Carotid stent insertion N/A 06/13/2012    Procedure: CAROTID STENT INSERTION;  Surgeon: Serafina Mitchell, MD;  Location: [facility] CATH LAB;  Service: Cardiovascular;  Laterality: N/A;    ROS: Complete review of systems performed and found to be negative unless outlined above  PHYSICAL EXAM BP 116/70 mmHg  Pulse 67  Ht 5\' 6"  (1.676 m)  Wt 177 lb (80.287 kg)  BMI 28.58 kg/m2  SpO2 98% General: Well developed, well nourished, in no acute distress Head: Eyes PERRLA, No xanthomas.   Normal cephalic and atramatic  Lungs: Clear  bilaterally to auscultation and percussion. Heart: HRRR S1 S2, without MRG.  Pulses are 2+ & equal.            No carotid bruit. No JVD.  No abdominal bruits. No femoral bruits. Abdomen: Bowel sounds are positive, abdomen soft and non-tender without masses or                  Hernia's noted. Msk:  Back normal, normal gait. Normal strength and tone for age. Extremities: No clubbing, cyanosis or edema.  DP +1 Neuro: Alert and oriented X 3. Psych:  Good affect, responds appropriately,Voice is raspy.   ASSESSMENT AND PLAN

## 2014-08-19 NOTE — Assessment & Plan Note (Signed)
He is stable and asymptomatic from a  CV standpoint, He is medically compliant. Will continue current medication regimen and see him annually.  Of course, will see him sooner if he is having problems.

## 2014-08-19 NOTE — Assessment & Plan Note (Signed)
He is 3 years post stent placement. He has now stopped Plavix due to new Rx for Nexium by Dr. Willey Blade.

## 2014-08-19 NOTE — Patient Instructions (Signed)
Your physician wants you to follow-up in: 1 year Jory Sims, NP. You will receive a reminder letter in the mail two months in advance. If you don't receive a letter, please call our office to schedule the follow-up appointment.  Your physician recommends that you continue on your current medications as directed. Please refer to the Current Medication list given to you today.  Thank you for choosing [facility]!

## 2014-08-19 NOTE — Assessment & Plan Note (Signed)
Labs are followed by Dr. Willey Blade. He does not tolerate statin's due to rash. He is advised on a low cholesterol diet.

## 2015-04-03 ENCOUNTER — Encounter: Payer: Self-pay | Admitting: Orthopedic Surgery

## 2015-04-03 ENCOUNTER — Ambulatory Visit (INDEPENDENT_AMBULATORY_CARE_PROVIDER_SITE_OTHER): Payer: Medicare HMO | Admitting: Orthopedic Surgery

## 2015-04-03 VITALS — BP 130/65 | Ht 66.0 in | Wt 176.0 lb

## 2015-04-03 DIAGNOSIS — M65331 Trigger finger, right middle finger: Secondary | ICD-10-CM | POA: Diagnosis not present

## 2015-04-03 DIAGNOSIS — M6530 Trigger finger, unspecified finger: Secondary | ICD-10-CM

## 2015-04-03 NOTE — Progress Notes (Signed)
Patient ID: Kenneth [name], male   DOB: 05-01-1941, 74 y.o.   MRN: [id number]  Follow up visit  Chief Complaint  Patient presents with  . Hand Problem    right long finger trigger finger recurring    BP 130/65 mmHg  Ht 5\' 6"  (1.676 m)  Wt 176 lb (79.833 kg)  BMI 28.42 kg/m2  Encounter Diagnosis  Name Primary?  Marland Kitchen Acquired trigger finger Yes     the patient actually has 2 complaints today #1 he has recurrent triggering of his right long finger with severe aching pain over the A1 pulley and he's had it for several weeks now.   Second complaint several month history of pain on the  Plantar aspect of the left foot with tenderness to palpation. The pain is noted when he gets up from a seated position or gets out of bed. He says that as severe as well and it is best described as a dull ache and is unrelieved by ice with a water bottle    review of systems neurologically no numbness or tingling. Vascular  Negative for feelings of temperature changes in the hand or the foot  Vital signs BP 130/65 mmHg  Ht 5\' 6"  (1.676 m)  Wt 176 lb (79.833 kg)  BMI 28.42 kg/m2   General appearance  His parents remains normal grooming hygiene intact  Orientation   He is oriented 4  Mood and affect   Mood is pleasant  Gait and station   normal  Cardiovascular  Left foot pulse 2+ no edema  Lymph system  deferred  Sensation  Normal and the left foot  , right hand  Deep tendon and pathologic reflexes exam   Negative Hoffman's toes downgoing  Coordination and balance  normal  Extremities/Skin (4)  Right hand first. Scans intact. There is tenderness over the A1 pulley catching and locking of the digit right long finger otherwise stable flexor tendons intact  Left foot scans intact and normal tenderness on the plantar aspect ankle range of motion shows some slight Achilles tightness otherwise stable no atrophy  Recommend 2 tuli  Heel cups and continued ice for the foot we injected the right long  finger.   Injection for triggering of the finger  Injection site: RLF Permission to inject granted. Timeout to confirm injection site.  The finger was prepped with alcohol and ethyl chloride. We injected 40 mg of Depo-Medrol and 2 mL 1% lidocaine at the A1 pulley  No complications were noted sterile dressing applied. Instructions given post injection for any problems.

## 2015-05-01 DIAGNOSIS — M722 Plantar fascial fibromatosis: Secondary | ICD-10-CM | POA: Diagnosis not present

## 2015-05-01 DIAGNOSIS — M79672 Pain in left foot: Secondary | ICD-10-CM | POA: Diagnosis not present

## 2015-05-28 DIAGNOSIS — M722 Plantar fascial fibromatosis: Secondary | ICD-10-CM | POA: Diagnosis not present

## 2015-05-28 DIAGNOSIS — M79672 Pain in left foot: Secondary | ICD-10-CM | POA: Diagnosis not present

## 2015-05-30 DIAGNOSIS — Z79899 Other long term (current) drug therapy: Secondary | ICD-10-CM | POA: Diagnosis not present

## 2015-05-30 DIAGNOSIS — I2510 Atherosclerotic heart disease of native coronary artery without angina pectoris: Secondary | ICD-10-CM | POA: Diagnosis not present

## 2015-05-30 DIAGNOSIS — K219 Gastro-esophageal reflux disease without esophagitis: Secondary | ICD-10-CM | POA: Diagnosis not present

## 2015-05-30 DIAGNOSIS — E785 Hyperlipidemia, unspecified: Secondary | ICD-10-CM | POA: Diagnosis not present

## 2015-05-30 DIAGNOSIS — R001 Bradycardia, unspecified: Secondary | ICD-10-CM | POA: Diagnosis not present

## 2015-06-06 DIAGNOSIS — I10 Essential (primary) hypertension: Secondary | ICD-10-CM | POA: Diagnosis not present

## 2015-06-06 DIAGNOSIS — Z23 Encounter for immunization: Secondary | ICD-10-CM | POA: Diagnosis not present

## 2015-06-06 DIAGNOSIS — I2510 Atherosclerotic heart disease of native coronary artery without angina pectoris: Secondary | ICD-10-CM | POA: Diagnosis not present

## 2015-06-06 DIAGNOSIS — Z6830 Body mass index (BMI) 30.0-30.9, adult: Secondary | ICD-10-CM | POA: Diagnosis not present

## 2015-06-06 DIAGNOSIS — Z0001 Encounter for general adult medical examination with abnormal findings: Secondary | ICD-10-CM | POA: Diagnosis not present

## 2015-07-01 DIAGNOSIS — R52 Pain, unspecified: Secondary | ICD-10-CM | POA: Diagnosis not present

## 2015-07-01 DIAGNOSIS — R42 Dizziness and giddiness: Secondary | ICD-10-CM | POA: Diagnosis not present

## 2015-07-01 DIAGNOSIS — Z6830 Body mass index (BMI) 30.0-30.9, adult: Secondary | ICD-10-CM | POA: Diagnosis not present

## 2015-07-03 ENCOUNTER — Other Ambulatory Visit (HOSPITAL_COMMUNITY): Payer: Self-pay | Admitting: Internal Medicine

## 2015-07-03 DIAGNOSIS — R262 Difficulty in walking, not elsewhere classified: Secondary | ICD-10-CM

## 2015-07-03 DIAGNOSIS — R42 Dizziness and giddiness: Secondary | ICD-10-CM

## 2015-07-18 ENCOUNTER — Ambulatory Visit (HOSPITAL_COMMUNITY)
Admission: RE | Admit: 2015-07-18 | Discharge: 2015-07-18 | Disposition: A | Discharge status: Home / Self Care | Payer: Medicare HMO | Source: Ambulatory Visit | Attending: Internal Medicine | Admitting: Internal Medicine

## 2015-07-18 DIAGNOSIS — R262 Difficulty in walking, not elsewhere classified: Secondary | ICD-10-CM | POA: Insufficient documentation

## 2015-07-18 DIAGNOSIS — R42 Dizziness and giddiness: Secondary | ICD-10-CM | POA: Insufficient documentation

## 2015-07-18 DIAGNOSIS — I6789 Other cerebrovascular disease: Secondary | ICD-10-CM | POA: Diagnosis not present

## 2015-08-07 ENCOUNTER — Ambulatory Visit (INDEPENDENT_AMBULATORY_CARE_PROVIDER_SITE_OTHER): Payer: Medicare HMO | Admitting: Cardiology

## 2015-08-07 ENCOUNTER — Encounter: Payer: Self-pay | Admitting: Cardiology

## 2015-08-07 VITALS — BP 136/74 | HR 56 | Ht 66.0 in | Wt 182.0 lb

## 2015-08-07 DIAGNOSIS — R0600 Dyspnea, unspecified: Secondary | ICD-10-CM

## 2015-08-07 DIAGNOSIS — I2510 Atherosclerotic heart disease of native coronary artery without angina pectoris: Secondary | ICD-10-CM | POA: Diagnosis not present

## 2015-08-07 DIAGNOSIS — E785 Hyperlipidemia, unspecified: Secondary | ICD-10-CM | POA: Diagnosis not present

## 2015-08-07 DIAGNOSIS — I10 Essential (primary) hypertension: Secondary | ICD-10-CM

## 2015-08-07 DIAGNOSIS — Z136 Encounter for screening for cardiovascular disorders: Secondary | ICD-10-CM | POA: Diagnosis not present

## 2015-08-07 MED ORDER — EZETIMIBE 10 MG PO TABS: 10.0000 mg | ORAL_TABLET | Freq: Every day | ORAL | Status: DC

## 2015-08-07 NOTE — Progress Notes (Signed)
Patient ID: Kenneth [name], male   DOB: [birth year], 75 y.o.   MRN: [id number]     Clinical Summary [name] is a 75 y.o.male 75 y.o.male seen today for follow up of the following medical problems.   1. CAD  - prior CABG in 2003, repeat cath 2007 with patent grafts.  - 07/2011 MPI low risk study, no ischemia.  - echo 04/2012 LVEF 65-60%, grade II diastolic dysfunction  - 0000000 echo showed LVEF 65-70%, grade II diastolic dysfunction.  - 11/2013 MPI no ischemia.    - reports isolated episode of chest pain 3-4 months ago. Seen by EMS, did not come to ER. No recurrent symptoms   2. Hyperlipidemia  - reported history of rash on statin, he reports tried multiple and all caused rash  - 05/2015: TC 205 TG 153 HDL 37 LDL 137  3. HTN  - compliant with meds  - does not check regularly   4. Carotid stenosis  - prior carotid stenting  - followed by vascular  5. SOB - remote 10-15 year history of smoking. Used to work in [place], mainly with walking up incline.  Past Medical History  Diagnosis Date  . Hypertension   . Stroke   . ASCVD (arteriosclerotic cardiovascular disease)   . GERD (gastroesophageal reflux disease)   . Abnormal chest x-ray   . Tobacco abuse   . Anxiety   . Depression   . High triglycerides   . Laryngeal carcinoma   . Nephrolithiasis   . Acute renal failure   . Coronary artery disease   . Peripheral vascular disease      Allergies  Allergen Reactions  . Erythromycin   . Lorazepam Other (See Comments)    REACTION: Alters mental status. "Turns into maniac"  . Penicillins Hives  . Sulfonamide Derivatives Other (See Comments)    REACTION: Unknown to patient. States that MD states allergy per medical records  . Statins Itching and Rash     Current Outpatient Prescriptions  Medication Sig Dispense Refill  . aspirin 81 MG tablet Take 81 mg by mouth daily.    . Cyanocobalamin (B-12) 2500 MCG TABS Take 2,500 mg by mouth  daily.    Marland Kitchen esomeprazole (NEXIUM) 20 MG capsule Take 40 mg by mouth daily at 12 noon.    . nitroGLYCERIN (NITROSTAT) 0.4 MG SL tablet Place 1 tablet (0.4 mg total) under the tongue every 5 (five) minutes as needed for chest pain. 25 tablet 12  . Omega-3 Fatty Acids (FISH OIL) 1200 MG CAPS Take 1,200 mg by mouth 3 (three) times daily.    Marland Kitchen PARoxetine (PAXIL) 10 MG tablet Take 10 mg by mouth at bedtime.     . prednisoLONE acetate (PRED FORTE) 1 % ophthalmic suspension     . valsartan (DIOVAN) 160 MG tablet Take 160 mg by mouth daily.      No current facility-administered medications for this visit.     Past Surgical History  Procedure Laterality Date  . Partial laryngectomy  1998  . Coronary artery bypass graft    . Colonoscopy  11/10/2011    Procedure: COLONOSCOPY;  Surgeon: Daneil Dolin, MD;  Location: [facility];  Service: Endoscopy;  Laterality: N/A;  8:30 AM  . Eye surgery    . Cardiac surgery    . Cholecystectomy  2007    Gall Bladder  . Carotid stent Right 06-13-12  . Carotid stent insertion N/A 06/13/2012    Procedure: CAROTID STENT  INSERTION;  Surgeon: Serafina Mitchell, MD;  Location: [facility] CATH LAB;  Service: Cardiovascular;  Laterality: N/A;     Allergies  Allergen Reactions  . Erythromycin   . Lorazepam Other (See Comments)    REACTION: Alters mental status. "Turns into maniac"  . Penicillins Hives  . Sulfonamide Derivatives Other (See Comments)    REACTION: Unknown to patient. States that MD states allergy per medical records  . Statins Itching and Rash      Family History  Problem Relation Age of Onset  . Dementia Mother   . Heart disease Father   . Colon cancer Neg Hx   . Cancer Brother   . Heart disease Brother   . Hyperlipidemia Brother   . Cancer Brother      Social History [name] reports that he quit smoking about 23 years ago. His smoking use included Cigarettes. He started smoking about 62 years ago. He has a 12 pack-year smoking  history. He quit smokeless tobacco use about 13 years ago. His smokeless tobacco use included Chew. [name] reports that he does not drink alcohol.   Review of Systems CONSTITUTIONAL: No weight loss, fever, chills, weakness or fatigue.  HEENT: Eyes: No visual loss, blurred vision, double vision or yellow sclerae.No hearing loss, sneezing, congestion, runny nose or sore throat.  SKIN: No rash or itching.  CARDIOVASCULAR: per hpi RESPIRATORY: No shortness of breath, cough or sputum.  GASTROINTESTINAL: No anorexia, nausea, vomiting or diarrhea. No abdominal pain or blood.  GENITOURINARY: No burning on urination, no polyuria NEUROLOGICAL: No headache, dizziness, syncope, paralysis, ataxia, numbness or tingling in the extremities. No change in bowel or bladder control.  MUSCULOSKELETAL: No muscle, back pain, joint pain or stiffness.  LYMPHATICS: No enlarged nodes. No history of splenectomy.  PSYCHIATRIC: No history of depression or anxiety.  ENDOCRINOLOGIC: No reports of sweating, cold or heat intolerance. No polyuria or polydipsia.  Marland Kitchen   Physical Examination Filed Vitals:   08/07/15 0957  BP: 136/74  Pulse: 56   Filed Vitals:   08/07/15 0957  Height: 5\' 6"  (1.676 m)  Weight: 182 lb (82.555 kg)    Gen: resting comfortably, no acute distress HEENT: no scleral icterus, pupils equal round and reactive, no palptable cervical adenopathy,  CV: RRR, no m/w/g, no jvd Resp: Clear to auscultation bilaterally GI: abdomen is soft, non-tender, non-distended, normal bowel sounds, no hepatosplenomegaly MSK: extremities are warm, no edema.  Skin: warm, no rash Neuro:  no focal deficits Psych: appropriate affect   Diagnostic Studies 07/2011 MPI  Tomographic views were obtained using the short axis, vertical long axis, and horizontal long axis planes. No significant, reversible perfusion defects are noted to indicate ischemia.  Gated imaging reveals an EDV of 59, ESV of 18, T I D ratio  of 0.80, and LVEF of 69%.  IMPRESSION: Low risk exercise/Lexiscan Myoview as outlined. No diagnostic ST- segment changes or arrhythmias were noted. There is evidence of soft tissue attenuation, however no frank scar or ischemia. LVEF is normal at 69%.  04/2012 Echo  LVEF 65-70%, grade II diastolic dysfunction,   [id number] Echo Study Conclusions  - Left ventricle: The cavity size was normal. Wall thickness was increased in a pattern of mild LVH. Systolic function was vigorous. The estimated ejection fraction was in the range of 65% to 70%. Wall motion was normal; there were no regional wall motion abnormalities. Features are consistent with a pseudonormal left ventricular filling pattern, with concomitant abnormal relaxation and increased filling pressure (grade  2 diastolic dysfunction). - Aortic valve: Mildly calcified annulus. Trileaflet. Trivial regurgitation. - Mitral valve: Calcified annulus. Trivial regurgitation. - Left atrium: The atrium was mildly dilated. - Right ventricle: Systolic function was low normal. - Right atrium: Central venous pressure: 19mm Hg (est). - Atrial septum: No defect or patent foramen ovale was identified. - Tricuspid valve: Trivial regurgitation. - Pulmonary arteries: Systolic pressure could not be accurately estimated. - Pericardium, extracardiac: There was no pericardial effusion. Impressions:  - Normal LV chamber size with mild LVH and LVEF [id number], grade 2 diastolic dysfunction. MIld left atrial enlargement. Mild MAC. Low normal RV contraction. Unable to assess PASP - trivial tricuspid regurgitation.  11/2013 Lexiscan MPI IMPRESSION: 1. Negative Lexiscan MPI for ischemia  2. Normal left ventricular systolic function, LVEF [id number]  3. Low risk study for major cardiac events.  Assessment and Plan   1. CAD  - no current chest pain, recent Lexiscan without ischemia - continue to monitor, continue current meds  2. Hyperlipidemia   - not tolerant of statins, currently not on therapy  - will try zetia 10mg  daily  3. HTN  - at goal, continue current meds   4. Carotid stenosis  - continue regular vascular f/u  5. DOE - ongoing DOE, no clear cardiac cause. Previous smoker, also worked at [employer]. Will order PFTs  F/u 6 months    Arnoldo Lenis, M.D.

## 2015-08-07 NOTE — Patient Instructions (Signed)
Your physician wants you to follow-up in: 6 months with Dr Bryna Colander will receive a reminder letter in the mail two months in advance. If you don't receive a letter, please call our office to schedule the follow-up appointment.    START Zetia 10 mg daily   Your physician has recommended that you have a pulmonary function test. Pulmonary Function Tests are a group of tests that measure how well air moves in and out of your lungs.     If you need a refill on your cardiac medications before your next appointment, please call your pharmacy.     Thank you for choosing [facility] !

## 2015-08-08 ENCOUNTER — Encounter: Payer: Self-pay | Admitting: Family

## 2015-08-14 ENCOUNTER — Encounter (HOSPITAL_COMMUNITY): Payer: Medicare HMO

## 2015-08-14 ENCOUNTER — Ambulatory Visit: Payer: Medicare HMO | Admitting: Family

## 2015-08-15 ENCOUNTER — Other Ambulatory Visit: Payer: Self-pay | Admitting: Vascular Surgery

## 2015-08-15 ENCOUNTER — Encounter: Payer: Medicare HMO | Admitting: Family

## 2015-08-15 ENCOUNTER — Other Ambulatory Visit (HOSPITAL_COMMUNITY): Payer: Medicare Other

## 2015-08-15 ENCOUNTER — Ambulatory Visit (HOSPITAL_COMMUNITY)
Admission: RE | Admit: 2015-08-15 | Discharge: 2015-08-15 | Disposition: A | Discharge status: Home / Self Care | Payer: Medicare HMO | Source: Ambulatory Visit | Attending: Family | Admitting: Family

## 2015-08-15 DIAGNOSIS — Z48812 Encounter for surgical aftercare following surgery on the circulatory system: Secondary | ICD-10-CM

## 2015-08-15 DIAGNOSIS — I6523 Occlusion and stenosis of bilateral carotid arteries: Secondary | ICD-10-CM

## 2015-08-15 DIAGNOSIS — I6529 Occlusion and stenosis of unspecified carotid artery: Secondary | ICD-10-CM | POA: Diagnosis not present

## 2015-08-15 DIAGNOSIS — Z959 Presence of cardiac and vascular implant and graft, unspecified: Secondary | ICD-10-CM

## 2015-08-15 NOTE — Progress Notes (Signed)
Patient did not want to see provider after the carotid duplex performed today

## 2015-08-18 ENCOUNTER — Other Ambulatory Visit: Payer: Self-pay

## 2015-08-18 DIAGNOSIS — I6523 Occlusion and stenosis of bilateral carotid arteries: Secondary | ICD-10-CM

## 2015-08-18 NOTE — Patient Instructions (Signed)
Dear [name], Your recent Vascular Lab study  Jan. 120, 2017 indicates: No significant  Change.  Please follow up in  One Year !          Stroke Prevention Some medical conditions and behaviors are associated with an increased chance of having a stroke. You may prevent a stroke by making healthy choices and managing medical conditions. HOW CAN I REDUCE MY RISK OF HAVING A STROKE?   Stay physically active. Get at least 30 minutes of activity on most or all days.  Do not smoke. It may also be helpful to avoid exposure to secondhand smoke.  Limit alcohol use. Moderate alcohol use is considered to be:  No more than 2 drinks per day for men.  No more than 1 drink per day for nonpregnant women.  Eat healthy foods. This involves:  Eating 5 or more servings of fruits and vegetables a day.  Making dietary changes that address high blood pressure (hypertension), high cholesterol, diabetes, or obesity.  Manage your cholesterol levels.  Making food choices that are high in fiber and low in saturated fat, trans fat, and cholesterol may control cholesterol levels.  Take any prescribed medicines to control cholesterol as directed by your health care provider.  Manage your diabetes.  Controlling your carbohydrate and sugar intake is recommended to manage diabetes.  Take any prescribed medicines to control diabetes as directed by your health care provider.  Control your hypertension.  Making food choices that are low in salt (sodium), saturated fat, trans fat, and cholesterol is recommended to manage hypertension.  Ask your health care provider if you need treatment to lower your blood pressure. Take any prescribed medicines to control hypertension as directed by your health care provider.  If you are 28-39 years of age, have your blood pressure checked every 3-5 years. If you are 23 years of age or older, have your blood pressure checked every year.  Maintain a  healthy weight.  Reducing calorie intake and making food choices that are low in sodium, saturated fat, trans fat, and cholesterol are recommended to manage weight.  Stop drug abuse.  Avoid taking birth control pills.  Talk to your health care provider about the risks of taking birth control pills if you are over 55 years old, smoke, get migraines, or have ever had a blood clot.  Get evaluated for sleep disorders (sleep apnea).  Talk to your health care provider about getting a sleep evaluation if you snore a lot or have excessive sleepiness.  Take medicines only as directed by your health care provider.  For some people, aspirin or blood thinners (anticoagulants) are helpful in reducing the risk of forming abnormal blood clots that can lead to stroke. If you have the irregular heart rhythm of atrial fibrillation, you should be on a blood thinner unless there is a good reason you cannot take them.  Understand all your medicine instructions.  Make sure that other conditions (such as anemia or atherosclerosis) are addressed. SEEK IMMEDIATE MEDICAL CARE IF:   You have sudden weakness or numbness of the face, arm, or leg, especially on one side of the body.  Your face or eyelid droops to one side.  You have sudden confusion.  You have trouble speaking (aphasia) or understanding.  You have sudden trouble seeing in one or both eyes.  You have sudden trouble walking.  You have dizziness.  You have a loss of balance or coordination.  You have a sudden, severe  headache with no known cause.  You have new chest pain or an irregular heartbeat. Any of these symptoms may represent a serious problem that is an emergency. Do not wait to see if the symptoms will go away. Get medical help at once. Call your local emergency services (911 in [place] Do not drive yourself to the hospital.   This information is not intended to replace advice given to you by your health care provider. Make sure  you discuss any questions you have with your health care provider.   Document Released: 08/19/2004 Document Revised: 08/02/2014 Document Reviewed: 01/12/2013 Elsevier Interactive Patient Education [employer].

## 2015-08-19 NOTE — Progress Notes (Signed)
Lab only 

## 2015-09-10 ENCOUNTER — Encounter (HOSPITAL_COMMUNITY): Payer: Medicare HMO

## 2015-09-24 ENCOUNTER — Ambulatory Visit (HOSPITAL_COMMUNITY)
Admission: RE | Admit: 2015-09-24 | Discharge: 2015-09-24 | Disposition: A | Discharge status: Home / Self Care | Payer: Medicare HMO | Source: Ambulatory Visit | Attending: Cardiology | Admitting: Cardiology

## 2015-09-24 DIAGNOSIS — R0600 Dyspnea, unspecified: Secondary | ICD-10-CM | POA: Insufficient documentation

## 2015-09-24 LAB — PULMONARY FUNCTION TEST
DL/VA % pred: 96 %
DL/VA: 4.17 ml/min/mmHg/L
DLCO unc % pred: 58 %
DLCO unc: 15.7 ml/min/mmHg
FEF 25-75 Post: 2.68 L/sec
FEF 25-75 Pre: 1.9 L/sec
FEF2575-%Change-Post: 40 %
FEF2575-%Pred-Post: 149 %
FEF2575-%Pred-Pre: 106 %
FEV1-%Change-Post: 10 %
FEV1-%Pred-Post: 82 %
FEV1-%Pred-Pre: 74 %
FEV1-Post: 2.08 L
FEV1-Pre: 1.88 L
FEV1FVC-%Change-Post: 0 %
FEV1FVC-%Pred-Pre: 111 %
FEV6-%Change-Post: 9 %
FEV6-%Pred-Post: 78 %
FEV6-%Pred-Pre: 71 %
FEV6-Post: 2.58 L
FEV6-Pre: 2.36 L
FEV6FVC-%Pred-Post: 107 %
FEV6FVC-%Pred-Pre: 107 %
FVC-%Change-Post: 9 %
FVC-%Pred-Post: 73 %
FVC-%Pred-Pre: 66 %
FVC-Post: 2.58 L
FVC-Pre: 2.36 L
Post FEV1/FVC ratio: 81 %
Post FEV6/FVC ratio: 100 %
Pre FEV1/FVC ratio: 80 %
Pre FEV6/FVC Ratio: 100 %
RV % pred: 80 %
RV: 1.89 L
TLC % pred: 67 %
TLC: 4.2 L

## 2015-09-24 MED ORDER — ALBUTEROL SULFATE (2.5 MG/3ML) 0.083% IN NEBU
2.5000 mg | INHALATION_SOLUTION | Freq: Once | RESPIRATORY_TRACT | Status: AC
  Administered 2015-09-24: 2.5 mg via RESPIRATORY_TRACT

## 2015-09-29 ENCOUNTER — Telehealth: Payer: Self-pay

## 2015-09-29 DIAGNOSIS — R0602 Shortness of breath: Secondary | ICD-10-CM

## 2015-09-29 NOTE — Telephone Encounter (Signed)
Have tried to get in touch with pt, with no luck. Send letter to have pt call us. Referred to Dr. Kathaleen Grinder for SOB.

## 2015-09-29 NOTE — Telephone Encounter (Signed)
-----   Message from Arnoldo Lenis, MD sent at 09/26/2015  1:17 PM EST ----- PFTs are abnormal, while they don't show typical findings of COPD there is evidence of decreased oxygen uptake. This likely is the cause of his SOB, please refer to Dr Luan Pulling for evaluation for SOB   Zandra Abts MD

## 2015-09-30 ENCOUNTER — Telehealth: Payer: Self-pay | Admitting: Cardiology

## 2015-09-30 NOTE — Telephone Encounter (Signed)
Results of breathing test. / tg

## 2015-10-24 DIAGNOSIS — J386 Stenosis of larynx: Secondary | ICD-10-CM | POA: Diagnosis not present

## 2015-10-24 DIAGNOSIS — R0600 Dyspnea, unspecified: Secondary | ICD-10-CM | POA: Diagnosis not present

## 2015-10-24 DIAGNOSIS — R490 Dysphonia: Secondary | ICD-10-CM | POA: Diagnosis not present

## 2015-10-24 DIAGNOSIS — Z8521 Personal history of malignant neoplasm of larynx: Secondary | ICD-10-CM | POA: Diagnosis not present

## 2015-10-24 DIAGNOSIS — J3800 Paralysis of vocal cords and larynx, unspecified: Secondary | ICD-10-CM | POA: Diagnosis not present

## 2015-11-05 DIAGNOSIS — Z8673 Personal history of transient ischemic attack (TIA), and cerebral infarction without residual deficits: Secondary | ICD-10-CM | POA: Diagnosis not present

## 2015-11-05 DIAGNOSIS — J398 Other specified diseases of upper respiratory tract: Secondary | ICD-10-CM | POA: Insufficient documentation

## 2015-11-05 DIAGNOSIS — J3800 Paralysis of vocal cords and larynx, unspecified: Secondary | ICD-10-CM | POA: Diagnosis not present

## 2015-11-10 ENCOUNTER — Other Ambulatory Visit (HOSPITAL_COMMUNITY): Payer: Self-pay | Admitting: Otolaryngology

## 2015-11-10 DIAGNOSIS — J3800 Paralysis of vocal cords and larynx, unspecified: Secondary | ICD-10-CM

## 2015-11-12 ENCOUNTER — Ambulatory Visit (HOSPITAL_COMMUNITY)
Admission: RE | Admit: 2015-11-12 | Discharge: 2015-11-12 | Disposition: A | Discharge status: Home / Self Care | Payer: Medicare HMO | Source: Ambulatory Visit | Attending: Otolaryngology | Admitting: Otolaryngology

## 2015-11-12 DIAGNOSIS — I6522 Occlusion and stenosis of left carotid artery: Secondary | ICD-10-CM | POA: Insufficient documentation

## 2015-11-12 DIAGNOSIS — J398 Other specified diseases of upper respiratory tract: Secondary | ICD-10-CM | POA: Diagnosis not present

## 2015-11-12 DIAGNOSIS — M47812 Spondylosis without myelopathy or radiculopathy, cervical region: Secondary | ICD-10-CM | POA: Diagnosis not present

## 2015-11-12 DIAGNOSIS — Z955 Presence of coronary angioplasty implant and graft: Secondary | ICD-10-CM | POA: Diagnosis not present

## 2015-11-12 DIAGNOSIS — R918 Other nonspecific abnormal finding of lung field: Secondary | ICD-10-CM | POA: Diagnosis not present

## 2015-11-12 DIAGNOSIS — R600 Localized edema: Secondary | ICD-10-CM | POA: Diagnosis not present

## 2015-11-12 DIAGNOSIS — J3800 Paralysis of vocal cords and larynx, unspecified: Secondary | ICD-10-CM | POA: Insufficient documentation

## 2015-11-12 LAB — POCT I-STAT CREATININE: Creatinine, Ser: 1.1 mg/dL (ref 0.61–1.24)

## 2015-11-12 MED ORDER — IOPAMIDOL (ISOVUE-300) INJECTION 61%
75.0000 mL | Freq: Once | INTRAVENOUS | Status: AC | PRN
  Administered 2015-11-12: 75 mL via INTRAVENOUS

## 2015-12-05 ENCOUNTER — Ambulatory Visit (INDEPENDENT_AMBULATORY_CARE_PROVIDER_SITE_OTHER)
Admission: RE | Admit: 2015-12-05 | Discharge: 2015-12-05 | Disposition: A | Discharge status: Home / Self Care | Payer: Medicare HMO | Source: Ambulatory Visit | Attending: Internal Medicine | Admitting: Internal Medicine

## 2015-12-05 ENCOUNTER — Ambulatory Visit (INDEPENDENT_AMBULATORY_CARE_PROVIDER_SITE_OTHER): Payer: Medicare HMO | Admitting: Internal Medicine

## 2015-12-05 ENCOUNTER — Encounter: Payer: Self-pay | Admitting: Internal Medicine

## 2015-12-05 VITALS — BP 150/80 | HR 64 | Ht 65.0 in | Wt 182.6 lb

## 2015-12-05 DIAGNOSIS — R0600 Dyspnea, unspecified: Secondary | ICD-10-CM

## 2015-12-05 DIAGNOSIS — R0602 Shortness of breath: Secondary | ICD-10-CM | POA: Diagnosis not present

## 2015-12-05 DIAGNOSIS — R0609 Other forms of dyspnea: Secondary | ICD-10-CM | POA: Insufficient documentation

## 2015-12-05 MED ORDER — FAMOTIDINE 20 MG PO TABS: ORAL_TABLET | ORAL | Status: DC

## 2015-12-05 MED ORDER — ESOMEPRAZOLE MAGNESIUM 20 MG PO CPDR: DELAYED_RELEASE_CAPSULE | ORAL | Status: DC

## 2015-12-05 NOTE — Progress Notes (Signed)
Quick Note:  Spoke with pt and notified of results per Dr. Wert. Pt verbalized understanding and denied any questions.  ______ 

## 2015-12-05 NOTE — Assessment & Plan Note (Addendum)
PFT's  09/24/15   FEV1 2.08(82 % ) ratio 81  p 10 % improvement from saba p no rx prior to study with DLCO  58 % correct to 61 % for alv volume   - 12/05/2015  Walked RA x 3 laps @ 185 ft each stopped due to  End of study, nl pace, no desat,  Mild sob - Spirometry 12/05/2015  FEV1 1.78 (64%)  Ratio 74  s truncation  So there is no evidence of a lung problem here and despite absence of truncation on pfts his problem appears to be entirely upper airway in terms of symptoms and should try max gerd rx pending f/u with Dr [name] for possible VC surgery   I had an extended discussion with the patient and son reviewing all relevant studies completed to date and  lasting 35 minutes of a 60 minute visit    Each maintenance medication was reviewed in detail including most importantly the difference between maintenance and prns and under what circumstances the prns are to be triggered using an action plan format that is not reflected in the computer generated alphabetically organized AVS.    Please see instructions for details which were reviewed in writing and the patient given a copy highlighting the part that I personally wrote and discussed at today's ov.

## 2015-12-05 NOTE — Progress Notes (Signed)
Subjective:    Patient ID: Kenneth [name], male    DOB: 08-22-1940,     MRN: [id number]  HPI  66 yowm quit smoking 1972 s/p laryngeal Ca RT/surgery and new doe x 2016 referred to pulmonary clinic 12/05/2015 by Dr  Willey Blade with last eval by [name] c/w new polyp   12/05/2015 1st [facility] office visit/ Anitha Kreiser   Chief Complaint  Patient presents with  . Advice Only    REferred by Dr. Willey Blade; SOB, no chest tightness, no cough.  Abnormal PFT done 3/1 in [facility].   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 but does fine slow and flat eg  [employer]   No obvious day to day or daytime variabilty or assoc chronic cough or cp or chest tightness, subjective wheeze overt sinus or hb symptoms. No unusual exp hx or h/o childhood pna/ asthma or knowledge of premature birth.  Sleeping ok without nocturnal  or early am exacerbation  of respiratory  c/o's or need for noct saba. Also denies any obvious fluctuation of symptoms with weather or environmental changes or other aggravating or alleviating factors except as outlined above   Current Medications, Allergies, Complete Past Medical History, Past Surgical History, Family History, and Social History were reviewed in [employer] record.             Review of Systems  Constitutional: Negative for fever, chills, activity change, appetite change and unexpected weight change.  HENT: Positive for congestion and postnasal drip. Negative for dental problem, rhinorrhea, sneezing, sore throat, trouble swallowing and voice change.   Eyes: Negative for visual disturbance.  Respiratory: Positive for shortness of breath. Negative for cough and choking.   Cardiovascular: Negative for chest pain and leg swelling.  Gastrointestinal: Negative for nausea, vomiting and abdominal pain.  Genitourinary: Negative for difficulty  urinating.  Musculoskeletal: Negative for arthralgias.  Skin: Negative for rash.  Psychiatric/Behavioral: Negative for behavioral problems and confusion.       Objective:   Physical Exam  amb wm extremely hoarse with prominent pseudowheeze  Wt Readings from Last 3 Encounters:  12/05/15 182 lb 9.6 oz (82.827 kg)  08/07/15 182 lb (82.555 kg)  07/18/15 176 lb (79.833 kg)    Vital signs reviewed   HEENT: nl  turbinates, and oropharynx. Nl external ear canals without cough reflex - top dentures    NECK :  without JVD/Nodes/TM/ nl carotid upstrokes bilaterally   LUNGS: no acc muscle use,  Nl contour chest which is clear to A and P bilaterally without cough on insp or exp maneuvers   CV:  RRR  no s3 or murmur or increase in P2, no edema   ABD:  soft and nontender with nl inspiratory excursion in the supine position. No bruits or organomegaly, bowel sounds nl  MS:  Nl gait/ ext warm without deformities, calf tenderness, cyanosis or clubbing No obvious joint restrictions   SKIN: warm and dry without lesions    NEURO:  alert, approp, nl sensorium with  no motor deficits   CXR PA and Lateral:   12/05/2015 :    I personally reviewed images and agree with radiology impression as follows:      Sternotomy wires are unchanged. Lungs are adequately inflated without focal consolidation or effusion. Mild linear scarring over the mid to  lower lungs. Cardiomediastinal silhouette is within normal. Multiple mediastinal surgical clips. Degenerative change of the spine.     Assessment & Plan:

## 2015-12-05 NOTE — Patient Instructions (Signed)
Change Nexium to 40 mg (= 2 x 20)   Take  30-60 min before first meal of the day and Pepcid (famotidine)  20 mg one @  bedtime until return to see Dr [name] and let him know if this helped  GERD (REFLUX)  is an extremely common cause of respiratory symptoms just like yours , many times with no obvious heartburn at all.    It can be treated with medication, but also with lifestyle changes including elevation of the head of your bed (ideally with 6 inch  bed blocks),  Smoking cessation, avoidance of late meals, excessive alcohol, and avoid fatty foods, chocolate, peppermint, colas, red wine, and acidic juices such as orange juice.  NO MINT OR MENTHOL PRODUCTS SO NO COUGH DROPS  USE SUGARLESS CANDY INSTEAD (Jolley ranchers or Stover's or Life Savers) or even ice chips will also do - the key is to swallow to prevent all throat clearing. NO OIL BASED VITAMINS - avoid fish oil for now  Use powdered substitutes.  There is no evidence of any significant lung disease - follow up here is as needed

## 2015-12-08 DIAGNOSIS — I10 Essential (primary) hypertension: Secondary | ICD-10-CM | POA: Diagnosis not present

## 2015-12-08 DIAGNOSIS — I2510 Atherosclerotic heart disease of native coronary artery without angina pectoris: Secondary | ICD-10-CM | POA: Diagnosis not present

## 2015-12-08 DIAGNOSIS — Z6830 Body mass index (BMI) 30.0-30.9, adult: Secondary | ICD-10-CM | POA: Diagnosis not present

## 2015-12-16 DIAGNOSIS — Z8521 Personal history of malignant neoplasm of larynx: Secondary | ICD-10-CM | POA: Diagnosis not present

## 2015-12-16 DIAGNOSIS — J3800 Paralysis of vocal cords and larynx, unspecified: Secondary | ICD-10-CM | POA: Diagnosis not present

## 2015-12-16 DIAGNOSIS — J386 Stenosis of larynx: Secondary | ICD-10-CM | POA: Diagnosis not present

## 2015-12-19 ENCOUNTER — Encounter: Payer: Self-pay | Admitting: Adult Health

## 2015-12-19 ENCOUNTER — Ambulatory Visit (INDEPENDENT_AMBULATORY_CARE_PROVIDER_SITE_OTHER): Payer: Medicare HMO | Admitting: Adult Health

## 2015-12-19 VITALS — BP 130/70 | HR 59 | Ht 66.0 in | Wt 183.0 lb

## 2015-12-19 DIAGNOSIS — Z01810 Encounter for preprocedural cardiovascular examination: Secondary | ICD-10-CM

## 2015-12-19 MED ORDER — NITROGLYCERIN 0.4 MG SL SUBL: 0.4000 mg | SUBLINGUAL_TABLET | SUBLINGUAL | Status: DC | PRN

## 2015-12-19 MED ORDER — EZETIMIBE 10 MG PO TABS: 10.0000 mg | ORAL_TABLET | Freq: Every day | ORAL | Status: DC

## 2015-12-19 NOTE — Progress Notes (Signed)
Cardiology Office Note   Date:  12/19/2015   ID:  Kenneth [name], DOB 1941-01-28, MRN [id number]  PCP:  Kenneth Noble, MD  Cardiologist: Kenneth Spring, NP   No chief complaint on file.     History of Present Illness: Kenneth [name] is a 75 y.o. male who presents for ongoing assessment and management of coronary artery disease with coronary bypass grafting in 2003, repeat cath in 2007 as patent grafts, most recent MMPI was low risk in 2013 and in 2015 which was negative for ischemia. . Other history includes hyperlipidemia, carotid artery stenosis, chronic dyspnea on exertion with prior history of tobacco abuse, and hypertension.  Patient's last seen in the office by Dr. Harl [name] in January 2017 and was found to be stable from a cardiac standpoint. No medications were adjusted and no further testing was planned at that time.the patient has been seen by pulmonology, Kenneth [name], on 12/05/2015 due to ongoing dyspnea. He was not found to have a "lung problem" despite the absence of truncation on PFTs. It was felt that his problem appeared to be entirely upper airway in terms of symptoms. He was referred to Dr. Janace [name] for possible VC surgery.  He has been referred to  Kenneth [name] for surgery by Dr. Janace [name]. He needs a per-operative cardiac evaluation.   He has not had had any cardiac symptoms of chest pain, weakness or dizziness. He does have chronic DOE.   Past Medical History  Diagnosis Date  . Hypertension   . Stroke ([place])   . ASCVD (arteriosclerotic cardiovascular disease)   . GERD (gastroesophageal reflux disease)   . Abnormal chest x-ray   . Tobacco abuse   . Anxiety   . Depression   . High triglycerides   . 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 Nephrolithiasis   . Acute renal failure ([place])   . Coronary artery disease   . Peripheral vascular disease [facility])     Past Surgical History  Procedure Laterality Date  . Partial laryngectomy  1998  . Coronary artery bypass graft    .  Colonoscopy  11/10/2011    Procedure: COLONOSCOPY;  Surgeon: Kenneth Dolin, MD;  Location: [facility];  Service: Endoscopy;  Laterality: N/A;  8:30 AM  . Eye surgery    . Cardiac surgery    . Cholecystectomy  2007    Gall Bladder  . Carotid stent Right 06-13-12  . Carotid stent insertion N/A 06/13/2012    Procedure: CAROTID STENT INSERTION;  Surgeon: Kenneth Mitchell, MD;  Location: [facility] CATH LAB;  Service: Cardiovascular;  Laterality: N/A;     Current Outpatient Prescriptions  Medication Sig Dispense Refill  . aspirin 81 MG tablet Take 81 mg by mouth daily.    Marland Kitchen esomeprazole (NEXIUM) 20 MG capsule Take 2 Take 30-60 min before first meal of the day    . famotidine (PEPCID) 20 MG tablet One at bedtime    . nitroGLYCERIN (NITROSTAT) 0.4 MG SL tablet Place 1 tablet (0.4 mg total) under the tongue every 5 (five) minutes as needed for chest pain. 25 tablet 3  . PARoxetine (PAXIL) 10 MG tablet Take 10 mg by mouth at bedtime.     . valsartan (DIOVAN) 160 MG tablet Take 160 mg by mouth daily.     Marland Kitchen ezetimibe (ZETIA) 10 MG tablet Take 1 tablet (10 mg total) by mouth daily. (Patient not taking: Reported on 12/05/2015) 90 tablet 3   No current facility-administered medications for this visit.  Allergies:   Erythromycin; Lorazepam; Penicillins; Sulfonamide derivatives; and Statins    Social History:  The patient  reports that he quit smoking about 45 years ago. His smoking use included Cigarettes. He started smoking about 62 years ago. He has a 15 pack-year smoking history. He quit smokeless tobacco use about 13 years ago. His smokeless tobacco use included Chew. He reports that he does not drink alcohol or use illicit drugs.   Family History:  The patient's family history includes Cancer in his brother and brother; Dementia in his mother; Heart disease in his brother and father; Hyperlipidemia in his brother. There is no history of Colon cancer.    ROS: All other systems are reviewed and  negative. Unless otherwise mentioned in H&P    PHYSICAL EXAM: VS:  BP 130/70 mmHg  Pulse 59  Ht 5\' 6"  (1.676 m)  Wt 183 lb (83.008 kg)  BMI 29.55 kg/m2  SpO2 96% , BMI Body mass index is 29.55 kg/(m^2). GEN: Well nourished, well developed, in no acute distress HEENT: normalVoice is raspy.  Neck: no JVD, carotid bruits, or masses Cardiac: RRR; no murmurs, rubs, or gallops,no edema  Respiratory:  clear to auscultation bilaterally, normal work of breathing GI: soft, nontender, nondistended, + BS MS: no deformity or atrophy Skin: warm and dry, no rash Neuro:  Strength and sensation are intact Psych: euthymic mood, full affect   EKG:  The ekg ordered today demonstrates normal sinus rhythm, nonspecific lateral T-wave abnormality, rate of 61 beats per minute.   Recent Labs: 11/12/2015: Creatinine, Ser 1.10    Lipid Panel    Component Value Date/Time   CHOL 181 08/13/2011 0539   TRIG 236* 08/13/2011 0539   HDL 35* 08/13/2011 0539   CHOLHDL 5.2 08/13/2011 0539   VLDL 47* 08/13/2011 0539   LDLCALC 99 08/13/2011 0539      Wt Readings from Last 3 Encounters:  12/19/15 183 lb (83.008 kg)  12/05/15 182 lb 9.6 oz (82.827 kg)  08/07/15 182 lb (82.555 kg)      Other studies Reviewed: Additional studies/ records that were reviewed today include: Dates of last echo.  Review of the above records demonstrates: 2015.   ASSESSMENT AND PLAN:  1.  Coronary artery disease, status post coronary artery bypass grafting in 2003.  A: Stress MPI 12/03/2013:   1.Negative Lexiscan MPI for ischemia  2. Normal left ventricular systolic function, LVEF [id number]  3. Low risk study for major cardiac events.               B. Echocardiogram 11/05/2013  Normal LV chamber size with mild LVH and LVEF 65-70%,  grade 2 diastolic dysfunction. MIld left atrial           enlargement. Mild MAC. Low normal RV contraction. Unable            to assess PASP - trivial tricuspid regurgitation.  As he  continues to have some mild dyspnea on exertion, I will repeat his echocardiogram to reevaluate LV systolic function. If no significant changes, the patient will be recommended to proceed with surgery at [name]. I will not repeat his stress test at this time as he has been asymptomatic with normal stress test completed 2015. In the interim, the patient will continue taking aspirin, valsartan, daily.  2. Chronic dyspnea on exertion: systema pulmonology and this was not felt to be related to his lungs but more upper airway. He has been seen by ENT who is referring him to [name] for removal of polyp on larynx.  3. Hypercholesterolemia: He has not been taking setting as directed. Have explained to him the necessity of taking this along with adhering to a low cholesterol diet as he is unable to take statin medications. New Rx is provided. Refills.  Current medicines are reviewed at length with the patient today.    Labs/ tests ordered today include: echocardiogram No orders of the defined types were placed in this encounter.     Disposition:   FU with 6 months unless symptomatic. Signed, Jory Sims, NP  12/19/2015 2:50 PM    [facility] [address] [place], [place] [address] Phone: [phone]; Fax: [phone]

## 2015-12-19 NOTE — Progress Notes (Deleted)
Name: Kenneth [name]    DOB: May 04, 1941  Age: 75 y.o.  MR#: [id number]       PCP:  [name], MD      Insurance: Payor: [employer] MEDICARE / Plan: AET[employer] MEDICARE HMO/PPO / Product Type: *No Product type* /   CC:   No chief complaint on file.   VS Filed Vitals:   12/19/15 1442  BP: 130/70  Pulse: 59  Height: 5' 6" (1.676 m)  Weight: 183 lb (83.008 kg)  SpO2: 96%    Weights Current Weight  12/19/15 183 lb (83.008 kg)  12/05/15 182 lb 9.6 oz (82.827 kg)  08/07/15 182 lb (82.555 kg)    Blood Pressure  BP Readings from Last 3 Encounters:  12/19/15 130/70  12/05/15 150/80  08/07/15 136/74     Admit date:  (Not on file) Last encounter with RMR:  Visit date not found   Allergy Erythromycin; Lorazepam; Penicillins; Sulfonamide derivatives; and Statins  Current Outpatient Prescriptions  Medication Sig Dispense Refill  . aspirin 81 MG tablet Take 81 mg by mouth daily.    Marland Kitchen esomeprazole (NEXIUM) 20 MG capsule Take 2 Take 30-60 min before first meal of the day    . famotidine (PEPCID) 20 MG tablet One at bedtime    . nitroGLYCERIN (NITROSTAT) 0.4 MG SL tablet Place 1 tablet (0.4 mg total) under the tongue every 5 (five) minutes as needed for chest pain. 25 tablet 3  . PARoxetine (PAXIL) 10 MG tablet Take 10 mg by mouth at bedtime.     . valsartan (DIOVAN) 160 MG tablet Take 160 mg by mouth daily.     Marland Kitchen ezetimibe (ZETIA) 10 MG tablet Take 1 tablet (10 mg total) by mouth daily. (Patient not taking: Reported on 12/05/2015) 90 tablet 3   No current facility-administered medications for this visit.    Discontinued Meds:    Medications Discontinued During This Encounter  Medication Reason  . Cyanocobalamin (B-12) 2500 MCG TABS Error  . nitroGLYCERIN (NITROSTAT) 0.4 MG SL tablet Reorder  . Omega-3 Fatty Acids (FISH OIL) 1200 MG CAPS Error  . prednisoLONE acetate (PRED FORTE) 1 % ophthalmic suspension Error    Patient Active Problem List   Diagnosis Date Noted  . Dyspnea 12/05/2015   . Aftercare following surgery of the circulatory system, [place] 08/03/2013  . Occlusion and stenosis of carotid artery without mention of cerebral infarction 07/21/2012  . Preop cardiovascular exam 05/08/2012  . Carotid stenosis, bilateral 05/05/2012  . CAD in native artery 08/13/2011  . NEOPLASM, MALIGNANT, LARYNX, CARCINOMA 12/14/2009  . HYPERLIPIDEMIA 12/14/2009  . CEREBROVASCULAR ACCIDENT 12/14/2009  . GASTROESOPHAGEAL REFLUX DISEASE 12/14/2009  . NEPHROLITHIASIS 12/14/2009    LABS    Component Value Date/Time   NA 141 06/13/2012 0738   NA 138 04/27/2012 1554   NA 140 08/12/2011 1730   K 4.4 06/13/2012 0738   K 4.1 04/27/2012 1554   K 4.2 08/12/2011 1730   CL 104 06/13/2012 0738   CL 101 04/27/2012 1554   CL 102 08/12/2011 1730   CO2 27 04/27/2012 1554   CO2 31 08/12/2011 1730   CO2 28 06/01/2010 1113   GLUCOSE 102* 06/13/2012 0738   GLUCOSE 99 04/27/2012 1554   GLUCOSE 93 08/12/2011 1730   BUN 18 06/13/2012 0738   BUN 15 04/27/2012 1554   BUN 12 08/12/2011 1730   CREATININE 1.10 11/12/2015 1237   CREATININE 1.10 06/13/2012 0738   CREATININE 1.16 04/27/2012 1554   CALCIUM 9.6 04/27/2012 1554  CALCIUM 9.7 08/12/2011 1730   CALCIUM 8.9 06/01/2010 1113   GFRNONAA 61* 04/27/2012 1554   GFRNONAA 63* 08/12/2011 1730   GFRNONAA >60 03/04/2008 1230   GFRAA 70* 04/27/2012 1554   GFRAA 73* 08/12/2011 1730   GFRAA  03/04/2008 1230    >60        The eGFR has been calculated using the MDRD equation. This calculation has not been validated in all clinical   CMP     Component Value Date/Time   NA 141 06/13/2012 0738   K 4.4 06/13/2012 0738   CL 104 06/13/2012 0738   CO2 27 04/27/2012 1554   GLUCOSE 102* 06/13/2012 0738   BUN 18 06/13/2012 0738   CREATININE 1.10 11/12/2015 1237   CALCIUM 9.6 04/27/2012 1554   PROT 7.0 08/12/2011 1730   ALBUMIN 3.6 08/12/2011 1730   AST 18 08/12/2011 1730   ALT 22 08/12/2011 1730   ALKPHOS 111 08/12/2011 1730   BILITOT 0.3  08/12/2011 1730   GFRNONAA 61* 04/27/2012 1554   GFRAA 70* 04/27/2012 1554       Component Value Date/Time   WBC 6.3 04/27/2012 1554   WBC 6.4 08/12/2011 1730   WBC 7.5 06/01/2010 1113   HGB 15.0 06/13/2012 0738   HGB 14.5 04/27/2012 1554   HGB 14.6 08/12/2011 1730   HCT 44.0 06/13/2012 0738   HCT 43.2 04/27/2012 1554   HCT 43.5 08/12/2011 1730   MCV 95.4 04/27/2012 1554   MCV 94.8 08/12/2011 1730   MCV 97.0 06/01/2010 1113    Lipid Panel     Component Value Date/Time   CHOL 181 08/13/2011 0539   TRIG 236* 08/13/2011 0539   HDL 35* 08/13/2011 0539   CHOLHDL 5.2 08/13/2011 0539   VLDL 47* 08/13/2011 0539   LDLCALC 99 08/13/2011 0539    ABG    Component Value Date/Time   TCO2 26 06/13/2012 0738     Lab Results  Component Value Date   TSH 2.601 06/01/2010   BNP (last 3 results) No results for input(s): BNP in the last 8760 hours.  ProBNP (last 3 results) No results for input(s): PROBNP in the last 8760 hours.  Cardiac Panel (last 3 results) No results for input(s): CKTOTAL, CKMB, TROPONINI, RELINDX in the last 72 hours.  Iron/TIBC/Ferritin/ %Sat No results found for: IRON, TIBC, FERRITIN, IRONPCTSAT   EKG Orders placed or performed in visit on 08/07/15  . EKG 12-Lead     Prior Assessment and Plan Problem List as of 12/19/2015      Cardiovascular and Mediastinum   CEREBROVASCULAR ACCIDENT   CAD in native artery   Last Assessment & Plan 08/19/2014 Office Visit Written 08/19/2014  2:17 PM by Kathryn M Lawrence, NP    He is stable and asymptomatic from a  CV standpoint, He is medically compliant. Will continue current medication regimen and see him annually.  Of course, will see him sooner if he is having problems.      Carotid stenosis, bilateral   Last Assessment & Plan 08/19/2014 Office Visit Written 08/19/2014  2:19 PM by Kathryn M Lawrence, NP    He is 3 years post stent placement. He has now stopped Plavix due to new Rx for Nexium by Dr. Fagan.        Occlusion and stenosis of carotid artery without mention of cerebral infarction     Respiratory   NEOPLASM, MALIGNANT, LARYNX, CARCINOMA     Digestive   GASTROESOPHAGEAL REFLUX DISEASE       Genitourinary   NEPHROLITHIASIS     Other   HYPERLIPIDEMIA   Last Assessment & Plan 08/19/2014 Office Visit Written 08/19/2014  2:18 PM by Kathryn M Lawrence, NP    Labs are followed by Dr. Fagan. He does not tolerate statin's due to rash. He is advised on a low cholesterol diet.       Preop cardiovascular exam   Last Assessment & Plan 05/08/2012 Office Visit Written 05/08/2012 11:48 AM by Gregg W Taylor, MD    The patient's surgical risk for pending carotid endarterectomy is low from a cardiovascular perspective. His prior radiation therapy and prior laryngectomy may make the technical aspects of carotid endarterectomy more difficult. He is allowed to proceed with surgery as scheduled. We will be available if perioperative cardiovascular care is necessary.      Aftercare following surgery of the circulatory system, [facility]   Dyspnea   Last Assessment & Plan 12/05/2015 Office Visit Edited 12/05/2015 11:22 AM by Michael B Wert, MD    PFT's  09/24/15   FEV1 2.08(82 % ) ratio 81  p 10 % improvement from saba p no rx prior to study with DLCO  58 % correct to 61 % for alv volume   - 12/05/2015  Walked RA x 3 laps @ 185 ft each stopped due to  End of study, nl pace, no desat,  Mild sob - Spirometry 12/05/2015  FEV1 1.78 (64%)  Ratio 74  s truncation  So there is no evidence of a lung problem here and despite absence of truncation on pfts his problem appears to be entirely upper airway in terms of symptoms and should try max gerd rx pending f/u with Dr Byers for possible VC surgery   I had an extended discussion with the patient and son reviewing all relevant studies completed to date and  lasting 35 minutes of a 60 minute visit    Each maintenance medication was reviewed in detail including most importantly the  difference between maintenance and prns and under what circumstances the prns are to be triggered using an action plan format that is not reflected in the computer generated alphabetically organized AVS.    Please see instructions for details which were reviewed in writing and the patient given a copy highlighting the part that I personally wrote and discussed at today's ov.           Imaging: Dg Chest 2 View  12/05/2015  CLINICAL DATA:  Shortness of breath 2 months. EXAM: CHEST  2 VIEW COMPARISON:  12/03/2013 FINDINGS: Sternotomy wires are unchanged. Lungs are adequately inflated without focal consolidation or effusion. Mild linear scarring over the mid to lower lungs. Cardiomediastinal silhouette is within normal. Multiple mediastinal surgical clips. Degenerative change of the spine. IMPRESSION: No acute cardiopulmonary disease. Electronically Signed   By: Daniel  Boyle M.D.   On: 12/05/2015 14:25          

## 2015-12-19 NOTE — Patient Instructions (Signed)
Your physician wants you to follow-up in: 6 Months with Dr. [name]. You will receive a reminder letter in the mail two months in advance. If you don't receive a letter, please call our office to schedule the follow-up appointment.  Your physician has recommended you make the following change in your medication:   Start Zetia 10 mg Daily   Your physician has requested that you have an echocardiogram. Echocardiography is a painless test that uses sound waves to create images of your heart. It provides your doctor with information about the size and shape of your heart and how well your heart's chambers and valves are working. This procedure takes approximately one hour. There are no restrictions for this procedure.  If you need a refill on your cardiac medications before your next appointment, please call your pharmacy.  Thank you for choosing [facility]!

## 2015-12-23 ENCOUNTER — Ambulatory Visit (HOSPITAL_COMMUNITY)
Admission: RE | Admit: 2015-12-23 | Discharge: 2015-12-23 | Disposition: A | Discharge status: Home / Self Care | Payer: Medicare HMO | Source: Ambulatory Visit | Attending: Adult Health | Admitting: Adult Health

## 2015-12-23 DIAGNOSIS — Z01810 Encounter for preprocedural cardiovascular examination: Secondary | ICD-10-CM

## 2015-12-23 DIAGNOSIS — E785 Hyperlipidemia, unspecified: Secondary | ICD-10-CM | POA: Diagnosis not present

## 2015-12-23 DIAGNOSIS — I351 Nonrheumatic aortic (valve) insufficiency: Secondary | ICD-10-CM | POA: Diagnosis not present

## 2015-12-23 DIAGNOSIS — I2510 Atherosclerotic heart disease of native coronary artery without angina pectoris: Secondary | ICD-10-CM | POA: Insufficient documentation

## 2015-12-23 DIAGNOSIS — I517 Cardiomegaly: Secondary | ICD-10-CM | POA: Diagnosis not present

## 2015-12-24 ENCOUNTER — Encounter: Payer: Self-pay | Admitting: *Deleted

## 2016-01-08 DIAGNOSIS — R0600 Dyspnea, unspecified: Secondary | ICD-10-CM | POA: Diagnosis not present

## 2016-01-08 DIAGNOSIS — Q310 Web of larynx: Secondary | ICD-10-CM | POA: Diagnosis not present

## 2016-01-08 DIAGNOSIS — Z8521 Personal history of malignant neoplasm of larynx: Secondary | ICD-10-CM | POA: Diagnosis not present

## 2016-01-08 DIAGNOSIS — Z85819 Personal history of malignant neoplasm of unspecified site of lip, oral cavity, and pharynx: Secondary | ICD-10-CM | POA: Diagnosis not present

## 2016-01-08 DIAGNOSIS — J386 Stenosis of larynx: Secondary | ICD-10-CM | POA: Diagnosis not present

## 2016-01-08 DIAGNOSIS — I10 Essential (primary) hypertension: Secondary | ICD-10-CM | POA: Diagnosis not present

## 2016-01-08 DIAGNOSIS — J383 Other diseases of vocal cords: Secondary | ICD-10-CM | POA: Diagnosis not present

## 2016-01-08 DIAGNOSIS — E7800 Pure hypercholesterolemia, unspecified: Secondary | ICD-10-CM | POA: Diagnosis not present

## 2016-01-08 DIAGNOSIS — J3800 Paralysis of vocal cords and larynx, unspecified: Secondary | ICD-10-CM | POA: Diagnosis not present

## 2016-01-08 DIAGNOSIS — Z951 Presence of aortocoronary bypass graft: Secondary | ICD-10-CM | POA: Diagnosis not present

## 2016-01-08 DIAGNOSIS — Z87891 Personal history of nicotine dependence: Secondary | ICD-10-CM | POA: Diagnosis not present

## 2016-01-08 DIAGNOSIS — R0609 Other forms of dyspnea: Secondary | ICD-10-CM | POA: Diagnosis not present

## 2016-01-09 DIAGNOSIS — J386 Stenosis of larynx: Secondary | ICD-10-CM | POA: Diagnosis present

## 2016-01-09 DIAGNOSIS — Z8521 Personal history of malignant neoplasm of larynx: Secondary | ICD-10-CM | POA: Insufficient documentation

## 2016-01-09 DIAGNOSIS — J383 Other diseases of vocal cords: Secondary | ICD-10-CM | POA: Insufficient documentation

## 2016-02-05 DIAGNOSIS — Q310 Web of larynx: Secondary | ICD-10-CM | POA: Diagnosis not present

## 2016-02-05 DIAGNOSIS — J386 Stenosis of larynx: Secondary | ICD-10-CM | POA: Diagnosis not present

## 2016-02-05 DIAGNOSIS — Z8521 Personal history of malignant neoplasm of larynx: Secondary | ICD-10-CM | POA: Diagnosis not present

## 2016-02-05 DIAGNOSIS — R1310 Dysphagia, unspecified: Secondary | ICD-10-CM | POA: Diagnosis not present

## 2016-02-05 DIAGNOSIS — R0609 Other forms of dyspnea: Secondary | ICD-10-CM | POA: Diagnosis not present

## 2016-02-05 DIAGNOSIS — J3800 Paralysis of vocal cords and larynx, unspecified: Secondary | ICD-10-CM | POA: Diagnosis not present

## 2016-02-05 DIAGNOSIS — R0602 Shortness of breath: Secondary | ICD-10-CM | POA: Diagnosis not present

## 2016-02-05 DIAGNOSIS — R1312 Dysphagia, oropharyngeal phase: Secondary | ICD-10-CM | POA: Diagnosis not present

## 2016-02-12 DIAGNOSIS — Z6830 Body mass index (BMI) 30.0-30.9, adult: Secondary | ICD-10-CM | POA: Diagnosis not present

## 2016-02-12 DIAGNOSIS — I10 Essential (primary) hypertension: Secondary | ICD-10-CM | POA: Diagnosis not present

## 2016-02-12 DIAGNOSIS — J386 Stenosis of larynx: Secondary | ICD-10-CM | POA: Diagnosis not present

## 2016-03-03 DIAGNOSIS — H25013 Cortical age-related cataract, bilateral: Secondary | ICD-10-CM | POA: Diagnosis not present

## 2016-03-03 DIAGNOSIS — H524 Presbyopia: Secondary | ICD-10-CM | POA: Diagnosis not present

## 2016-03-03 DIAGNOSIS — Z0100 Encounter for examination of eyes and vision without abnormal findings: Secondary | ICD-10-CM | POA: Diagnosis not present

## 2016-03-09 DIAGNOSIS — R69 Illness, unspecified: Secondary | ICD-10-CM | POA: Diagnosis not present

## 2016-04-01 DIAGNOSIS — R0600 Dyspnea, unspecified: Secondary | ICD-10-CM | POA: Diagnosis not present

## 2016-04-01 DIAGNOSIS — J386 Stenosis of larynx: Secondary | ICD-10-CM | POA: Diagnosis not present

## 2016-04-01 DIAGNOSIS — R1312 Dysphagia, oropharyngeal phase: Secondary | ICD-10-CM | POA: Diagnosis not present

## 2016-04-01 DIAGNOSIS — Z8521 Personal history of malignant neoplasm of larynx: Secondary | ICD-10-CM | POA: Diagnosis not present

## 2016-04-01 DIAGNOSIS — R0602 Shortness of breath: Secondary | ICD-10-CM | POA: Diagnosis not present

## 2016-04-09 DIAGNOSIS — Z23 Encounter for immunization: Secondary | ICD-10-CM | POA: Diagnosis not present

## 2016-04-09 DIAGNOSIS — I10 Essential (primary) hypertension: Secondary | ICD-10-CM | POA: Diagnosis not present

## 2016-04-09 DIAGNOSIS — J386 Stenosis of larynx: Secondary | ICD-10-CM | POA: Diagnosis not present

## 2016-04-21 DIAGNOSIS — L570 Actinic keratosis: Secondary | ICD-10-CM | POA: Diagnosis not present

## 2016-06-21 DIAGNOSIS — Z79899 Other long term (current) drug therapy: Secondary | ICD-10-CM | POA: Diagnosis not present

## 2016-06-21 DIAGNOSIS — K219 Gastro-esophageal reflux disease without esophagitis: Secondary | ICD-10-CM | POA: Diagnosis not present

## 2016-06-21 DIAGNOSIS — E785 Hyperlipidemia, unspecified: Secondary | ICD-10-CM | POA: Diagnosis not present

## 2016-06-21 DIAGNOSIS — I2510 Atherosclerotic heart disease of native coronary artery without angina pectoris: Secondary | ICD-10-CM | POA: Diagnosis not present

## 2016-06-25 DIAGNOSIS — I2510 Atherosclerotic heart disease of native coronary artery without angina pectoris: Secondary | ICD-10-CM | POA: Diagnosis not present

## 2016-06-25 DIAGNOSIS — Z0001 Encounter for general adult medical examination with abnormal findings: Secondary | ICD-10-CM | POA: Diagnosis not present

## 2016-06-25 DIAGNOSIS — I10 Essential (primary) hypertension: Secondary | ICD-10-CM | POA: Diagnosis not present

## 2016-06-25 DIAGNOSIS — Z6830 Body mass index (BMI) 30.0-30.9, adult: Secondary | ICD-10-CM | POA: Diagnosis not present

## 2016-07-13 DIAGNOSIS — T783XXA Angioneurotic edema, initial encounter: Secondary | ICD-10-CM | POA: Diagnosis not present

## 2016-08-12 ENCOUNTER — Encounter: Payer: Self-pay | Admitting: Family

## 2016-08-17 ENCOUNTER — Ambulatory Visit (HOSPITAL_COMMUNITY)
Admission: RE | Admit: 2016-08-17 | Discharge: 2016-08-17 | Disposition: A | Discharge status: Home / Self Care | Payer: Medicare HMO | Source: Ambulatory Visit | Attending: Family | Admitting: Family

## 2016-08-17 ENCOUNTER — Ambulatory Visit: Payer: Medicare HMO | Admitting: Family

## 2016-08-17 DIAGNOSIS — I6523 Occlusion and stenosis of bilateral carotid arteries: Secondary | ICD-10-CM | POA: Insufficient documentation

## 2016-08-17 LAB — VAS US CAROTID
LEFT ECA DIAS: -26 cm/s
Left CCA dist dias: 28 cm/s
Left CCA dist sys: 103 cm/s
Left CCA prox dias: 26 cm/s
Left CCA prox sys: 142 cm/s
Left ICA dist dias: -32 cm/s
Left ICA dist sys: -111 cm/s
Left ICA prox dias: 44 cm/s
Left ICA prox sys: 151 cm/s
RIGHT CCA MID DIAS: 17 cm/s
RIGHT ECA DIAS: -29 cm/s
Right CCA prox dias: 16 cm/s
Right CCA prox sys: 55 cm/s
Right cca dist sys: -82 cm/s

## 2016-08-18 ENCOUNTER — Encounter (HOSPITAL_COMMUNITY): Payer: Medicare HMO

## 2016-08-18 ENCOUNTER — Ambulatory Visit: Payer: Medicare HMO | Admitting: Family

## 2016-08-19 ENCOUNTER — Telehealth: Payer: Self-pay

## 2016-08-19 NOTE — Telephone Encounter (Signed)
Pt called to get the results of his carotid u/s. Pt stated that he has already called and no one has returned his call. I told him that I would let the NP know that he called and that she or a nurse would return his call with the results. Pt would not let me reschedule his missed appt with NP.

## 2016-09-03 DIAGNOSIS — Z6830 Body mass index (BMI) 30.0-30.9, adult: Secondary | ICD-10-CM | POA: Diagnosis not present

## 2016-09-03 DIAGNOSIS — I10 Essential (primary) hypertension: Secondary | ICD-10-CM | POA: Diagnosis not present

## 2016-09-03 DIAGNOSIS — T783XXD Angioneurotic edema, subsequent encounter: Secondary | ICD-10-CM | POA: Diagnosis not present

## 2016-09-28 DIAGNOSIS — I10 Essential (primary) hypertension: Secondary | ICD-10-CM | POA: Diagnosis not present

## 2016-09-30 DIAGNOSIS — R0602 Shortness of breath: Secondary | ICD-10-CM | POA: Diagnosis not present

## 2016-09-30 DIAGNOSIS — J383 Other diseases of vocal cords: Secondary | ICD-10-CM | POA: Diagnosis not present

## 2016-09-30 DIAGNOSIS — Q310 Web of larynx: Secondary | ICD-10-CM | POA: Diagnosis not present

## 2016-09-30 DIAGNOSIS — R0609 Other forms of dyspnea: Secondary | ICD-10-CM | POA: Diagnosis not present

## 2016-09-30 DIAGNOSIS — J3800 Paralysis of vocal cords and larynx, unspecified: Secondary | ICD-10-CM | POA: Diagnosis not present

## 2016-09-30 DIAGNOSIS — Z8521 Personal history of malignant neoplasm of larynx: Secondary | ICD-10-CM | POA: Diagnosis not present

## 2016-09-30 DIAGNOSIS — J398 Other specified diseases of upper respiratory tract: Secondary | ICD-10-CM | POA: Diagnosis not present

## 2016-09-30 DIAGNOSIS — J386 Stenosis of larynx: Secondary | ICD-10-CM | POA: Diagnosis not present

## 2016-09-30 DIAGNOSIS — R1312 Dysphagia, oropharyngeal phase: Secondary | ICD-10-CM | POA: Diagnosis not present

## 2016-10-05 ENCOUNTER — Encounter (HOSPITAL_COMMUNITY): Payer: Self-pay | Admitting: Cardiology

## 2016-10-05 ENCOUNTER — Emergency Department (HOSPITAL_COMMUNITY): Payer: Medicare HMO

## 2016-10-05 ENCOUNTER — Observation Stay (HOSPITAL_COMMUNITY)
Admission: EM | Admit: 2016-10-05 | Discharge: 2016-10-08 | Disposition: A | Discharge status: Home / Self Care | Payer: Medicare HMO | Attending: Cardiovascular Disease | Admitting: Cardiovascular Disease

## 2016-10-05 DIAGNOSIS — E782 Mixed hyperlipidemia: Secondary | ICD-10-CM | POA: Diagnosis present

## 2016-10-05 DIAGNOSIS — Z882 Allergy status to sulfonamides status: Secondary | ICD-10-CM | POA: Diagnosis not present

## 2016-10-05 DIAGNOSIS — E781 Pure hyperglyceridemia: Secondary | ICD-10-CM | POA: Insufficient documentation

## 2016-10-05 DIAGNOSIS — F419 Anxiety disorder, unspecified: Secondary | ICD-10-CM | POA: Diagnosis not present

## 2016-10-05 DIAGNOSIS — E785 Hyperlipidemia, unspecified: Secondary | ICD-10-CM | POA: Diagnosis not present

## 2016-10-05 DIAGNOSIS — K219 Gastro-esophageal reflux disease without esophagitis: Secondary | ICD-10-CM | POA: Insufficient documentation

## 2016-10-05 DIAGNOSIS — R001 Bradycardia, unspecified: Secondary | ICD-10-CM | POA: Diagnosis not present

## 2016-10-05 DIAGNOSIS — N289 Disorder of kidney and ureter, unspecified: Secondary | ICD-10-CM | POA: Insufficient documentation

## 2016-10-05 DIAGNOSIS — Z87891 Personal history of nicotine dependence: Secondary | ICD-10-CM | POA: Diagnosis not present

## 2016-10-05 DIAGNOSIS — Z7982 Long term (current) use of aspirin: Secondary | ICD-10-CM | POA: Insufficient documentation

## 2016-10-05 DIAGNOSIS — J386 Stenosis of larynx: Secondary | ICD-10-CM | POA: Insufficient documentation

## 2016-10-05 DIAGNOSIS — Z923 Personal history of irradiation: Secondary | ICD-10-CM | POA: Insufficient documentation

## 2016-10-05 DIAGNOSIS — I2582 Chronic total occlusion of coronary artery: Secondary | ICD-10-CM | POA: Insufficient documentation

## 2016-10-05 DIAGNOSIS — R0602 Shortness of breath: Secondary | ICD-10-CM

## 2016-10-05 DIAGNOSIS — I4510 Unspecified right bundle-branch block: Secondary | ICD-10-CM | POA: Diagnosis not present

## 2016-10-05 DIAGNOSIS — Z951 Presence of aortocoronary bypass graft: Secondary | ICD-10-CM | POA: Insufficient documentation

## 2016-10-05 DIAGNOSIS — F329 Major depressive disorder, single episode, unspecified: Secondary | ICD-10-CM | POA: Insufficient documentation

## 2016-10-05 DIAGNOSIS — R0609 Other forms of dyspnea: Secondary | ICD-10-CM | POA: Diagnosis not present

## 2016-10-05 DIAGNOSIS — I10 Essential (primary) hypertension: Secondary | ICD-10-CM | POA: Insufficient documentation

## 2016-10-05 DIAGNOSIS — C329 Malignant neoplasm of larynx, unspecified: Secondary | ICD-10-CM | POA: Diagnosis present

## 2016-10-05 DIAGNOSIS — R079 Chest pain, unspecified: Secondary | ICD-10-CM | POA: Diagnosis not present

## 2016-10-05 DIAGNOSIS — Z8521 Personal history of malignant neoplasm of larynx: Secondary | ICD-10-CM | POA: Insufficient documentation

## 2016-10-05 DIAGNOSIS — G2581 Restless legs syndrome: Secondary | ICD-10-CM | POA: Insufficient documentation

## 2016-10-05 DIAGNOSIS — I739 Peripheral vascular disease, unspecified: Secondary | ICD-10-CM | POA: Insufficient documentation

## 2016-10-05 DIAGNOSIS — I6523 Occlusion and stenosis of bilateral carotid arteries: Secondary | ICD-10-CM | POA: Insufficient documentation

## 2016-10-05 DIAGNOSIS — Z880 Allergy status to penicillin: Secondary | ICD-10-CM | POA: Diagnosis not present

## 2016-10-05 DIAGNOSIS — I2510 Atherosclerotic heart disease of native coronary artery without angina pectoris: Secondary | ICD-10-CM | POA: Diagnosis not present

## 2016-10-05 DIAGNOSIS — Z8673 Personal history of transient ischemic attack (TIA), and cerebral infarction without residual deficits: Secondary | ICD-10-CM | POA: Insufficient documentation

## 2016-10-05 DIAGNOSIS — Z8249 Family history of ischemic heart disease and other diseases of the circulatory system: Secondary | ICD-10-CM | POA: Insufficient documentation

## 2016-10-05 HISTORY — DX: Other specified diseases of upper respiratory tract: J39.8

## 2016-10-05 LAB — BASIC METABOLIC PANEL
Anion gap: 8 (ref 5–15)
BUN: 17 mg/dL (ref 6–20)
CO2: 28 mmol/L (ref 22–32)
Calcium: 9.5 mg/dL (ref 8.9–10.3)
Chloride: 100 mmol/L — ABNORMAL LOW (ref 101–111)
Creatinine, Ser: 1.36 mg/dL — ABNORMAL HIGH (ref 0.61–1.24)
GFR calc Af Amer: 56 mL/min — ABNORMAL LOW (ref >60)
GFR calc non Af Amer: 49 mL/min — ABNORMAL LOW (ref >60)
Glucose, Bld: 78 mg/dL (ref 65–99)
Potassium: 3.9 mmol/L (ref 3.5–5.1)
Sodium: 136 mmol/L (ref 135–145)

## 2016-10-05 LAB — HEPATIC FUNCTION PANEL
ALT: 29 U/L (ref 17–63)
AST: 24 U/L (ref 15–41)
Albumin: 4.2 g/dL (ref 3.5–5.0)
Alkaline Phosphatase: 118 U/L (ref 38–126)
Bilirubin, Direct: <0.1 mg/dL — ABNORMAL LOW (ref 0.1–0.5)
Total Bilirubin: 0.4 mg/dL (ref 0.3–1.2)
Total Protein: 7.7 g/dL (ref 6.5–8.1)

## 2016-10-05 LAB — BRAIN NATRIURETIC PEPTIDE: B Natriuretic Peptide: 52 pg/mL (ref 0.0–100.0)

## 2016-10-05 LAB — D-DIMER, QUANTITATIVE (NOT AT ARMC): D-Dimer, Quant: 0.6 ug/mL-FEU — ABNORMAL HIGH (ref 0.00–0.50)

## 2016-10-05 LAB — CBC
HCT: 42.1 % (ref 39.0–52.0)
Hemoglobin: 14.4 g/dL (ref 13.0–17.0)
MCH: 32 pg (ref 26.0–34.0)
MCHC: 34.2 g/dL (ref 30.0–36.0)
MCV: 93.6 fL (ref 78.0–100.0)
Platelets: 273 10*3/uL (ref 150–400)
RBC: 4.5 MIL/uL (ref 4.22–5.81)
RDW: 13.4 % (ref 11.5–15.5)
WBC: 10.1 10*3/uL (ref 4.0–10.5)

## 2016-10-05 LAB — TROPONIN I: Troponin I: <0.03 ng/mL (ref <0.03)

## 2016-10-05 MED ORDER — IOPAMIDOL (ISOVUE-370) INJECTION 76%
100.0000 mL | Freq: Once | INTRAVENOUS | Status: AC | PRN
  Administered 2016-10-05: 100 mL via INTRAVENOUS

## 2016-10-05 NOTE — ED Notes (Signed)
Patient transported to CT 

## 2016-10-05 NOTE — ED Triage Notes (Signed)
Headache and  Weakness for 2 months.  Today the headache and weakness are worse.  Pt normally has SOB,  But sob has been worse in the last week.

## 2016-10-05 NOTE — ED Provider Notes (Addendum)
[place] DEPT Provider Note   CSN: [id number] Arrival date & time: 10/05/16  1714  By signing my name below, I, Oleh Genin, attest that this documentation has been prepared under the direction and in the presence of Milton Ferguson, MD. Electronically Signed: Oleh Genin, Scribe. 10/05/16. 9:27 PM.   History   Chief Complaint Chief Complaint  Patient presents with  . Shortness of Breath    HPI Kenneth [name] is a 76 y.o. male with history of CAD with CABG in 2003, HTN, HLD, and prior carotid stenting who presents to the ED for evaluation of dyspnea on exertion. This patient states that "whenever he does anything" he becomes significantly short of breath with global weakness and fatigue. He is not anticoagulated. He is also reporting headache at interview currently. He denies any chest pain.   The history is provided by the patient. No language interpreter was used.  Shortness of Breath  This is a chronic problem. Duration: Worse in the last week. The problem occurs continuously.The current episode started more than 1 week ago. The problem has not changed since onset.Associated symptoms include headaches. Pertinent negatives include no fever, no cough, no chest pain, no abdominal pain and no rash. Precipitated by: exertion. Risk factors: cardiac history. He has tried nothing for the symptoms. Associated medical issues include CAD and past MI.    Past Medical History:  Diagnosis Date  . Abnormal chest x-ray   . Acute renal failure ([place])   . Anxiety   . ASCVD (arteriosclerotic cardiovascular disease)   . Coronary artery disease   . Depression   . GERD (gastroesophageal reflux disease)   . High triglycerides   . Hypertension   . 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 Nephrolithiasis   . Peripheral vascular disease ([place])   . Stroke ([place])   . Tobacco abuse     Patient Active Problem List   Diagnosis Date Noted  . Dyspnea 12/05/2015  . Aftercare following surgery of the  circulatory system, [place] 08/03/2013  . Occlusion and stenosis of carotid artery without mention of cerebral infarction 07/21/2012  . Preop cardiovascular exam 05/08/2012  . Carotid stenosis, bilateral 05/05/2012  . CAD in native artery 08/13/2011  . NEOPLASM, MALIGNANT, LARYNX, CARCINOMA 12/14/2009  . HYPERLIPIDEMIA 12/14/2009  . CEREBROVASCULAR ACCIDENT 12/14/2009  . GASTROESOPHAGEAL REFLUX DISEASE 12/14/2009  . 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 CARDIAC SURGERY    . CAROTID STENT Right 06-13-12  . CAROTID STENT INSERTION N/A 06/13/2012   Procedure: CAROTID STENT INSERTION;  Surgeon: Serafina Mitchell, MD;  Location: [facility] CATH LAB;  Service: Cardiovascular;  Laterality: N/A;  . CHOLECYSTECTOMY  2007   Gall Bladder  . COLONOSCOPY  11/10/2011   Procedure: COLONOSCOPY;  Surgeon: Daneil Dolin, MD;  Location: [facility] ENDO SUITE;  Service: Endoscopy;  Laterality: N/A;  8:30 AM  . CORONARY ARTERY BYPASS GRAFT    . EYE SURGERY    . PARTIAL LARYNGECTOMY  1998       Home Medications    Prior to Admission medications   Medication Sig Start Date End Date Taking? Authorizing Provider  aspirin 81 MG tablet Take 81 mg by mouth daily.    Historical Provider, MD  esomeprazole (NEXIUM) 20 MG capsule Take 2 Take 30-60 min before first meal of the day 12/05/15   Tanda Rockers, MD  ezetimibe (ZETIA) 10 MG tablet Take 1 tablet (10 mg total) by mouth daily. 12/19/15   Curt Bears  Brooks Sailors, NP  famotidine (PEPCID) 20 MG tablet One at bedtime 12/05/15   Tanda Rockers, MD  nitroGLYCERIN (NITROSTAT) 0.4 MG SL tablet Place 1 tablet (0.4 mg total) under the tongue every 5 (five) minutes as needed for chest pain. 12/19/15 04/11/20  Lendon Colonel, NP  PARoxetine (PAXIL) 10 MG tablet Take 10 mg by mouth at bedtime.     Historical Provider, MD  valsartan (DIOVAN) 160 MG tablet Take 160 mg by mouth daily.  11/01/13   Historical Provider, MD    Family History Family  History  Problem Relation Age of Onset  . Dementia Mother   . Heart disease Father   . Cancer Brother   . Heart disease Brother   . Hyperlipidemia Brother   . Cancer Brother   . Colon cancer Neg Hx     Social History Social History  Substance Use Topics  . Smoking status: Former Smoker    Packs/day: 1.00    Years: 15.00    Types: Cigarettes    Start date: 04/22/1953    Quit date: 07/26/1970  . Smokeless tobacco: Former Systems developer    Types: Chew    Quit date: 05/05/2002  . Alcohol use No     Allergies   Erythromycin; Lorazepam; Penicillins; Sulfonamide derivatives; and Statins   Review of Systems Review of Systems  Constitutional: Positive for fatigue. Negative for appetite change and fever.  HENT: Negative for congestion, ear discharge and sinus pressure.   Eyes: Negative for discharge.  Respiratory: Positive for shortness of breath. Negative for cough.   Cardiovascular: Negative for chest pain.  Gastrointestinal: Negative for abdominal pain and diarrhea.  Genitourinary: Negative for frequency and hematuria.  Musculoskeletal: Negative for back pain.  Skin: Negative for rash.  Neurological: Positive for headaches. Negative for seizures.  Psychiatric/Behavioral: Negative for hallucinations.     Physical Exam Updated Vital Signs BP 162/93 (BP Location: Left Arm)   Pulse 78   Temp 98.6 F (37 C) (Oral)   Resp 21   Ht 5\' 6"  (1.676 m)   Wt 180 lb (81.6 kg)   SpO2 100%   BMI 29.05 kg/m   Physical Exam  Constitutional: He is oriented to person, place, and time. He appears well-developed.  HENT:  Head: Normocephalic.  Eyes: Conjunctivae and EOM are normal. No scleral icterus.  Neck: Neck supple. No thyromegaly present.  Cardiovascular: Normal rate and regular rhythm.  Exam reveals no gallop and no friction rub.   No murmur heard. Pulmonary/Chest: No stridor. He has no wheezes. He has no rales. He exhibits no tenderness.  Abdominal: He exhibits no distension. There  is no tenderness. There is no rebound.  Musculoskeletal: Normal range of motion. He exhibits no edema.  Lymphadenopathy:    He has no cervical adenopathy.  Neurological: He is oriented to person, place, and time. He exhibits normal muscle tone. Coordination normal.  Skin: No rash noted. No erythema.  Psychiatric: He has a normal mood and affect. His behavior is normal.     ED Treatments / Results  DIAGNOSTIC STUDIES: Oxygen Saturation is 100 percent on room air which is normal by my interpretation.    COORDINATION OF CARE: 9:25 PM Discussed treatment plan with pt at bedside and pt agreed to plan.  Labs (all labs ordered are listed, but only abnormal results are displayed) Labs Reviewed  BASIC METABOLIC PANEL - Abnormal; Notable for the following:       Result Value   Chloride 100 (*)  Creatinine, Ser 1.36 (*)    GFR calc non Af Amer 49 (*)    GFR calc Af Amer 56 (*)    All other components within normal limits  CBC  TROPONIN I    EKG  EKG Interpretation None       Radiology Dg Chest 2 View  Result Date: 10/05/2016 CLINICAL DATA:  Shortness of Breath EXAM: CHEST  2 VIEW COMPARISON:  12/05/2015 FINDINGS: Postsurgical changes are again seen. Cardiac shadow is stable. The lungs are well aerated with mild chronic scarring. No acute infiltrate or sizable effusion is noted. No bony abnormality is seen. IMPRESSION: No active cardiopulmonary disease. Electronically Signed   By: Inez Catalina M.D.   On: 10/05/2016 18:19    Procedures Procedures (including critical care time)  Medications Ordered in ED Medications - No data to display   Initial Impression / Assessment and Plan / ED Course  I have reviewed the triage vital signs and the nursing notes.  Pertinent labs & imaging results that were available during my care of the patient were reviewed by me and considered in my medical decision making (see chart for details).     Patient complaining of dyspnea on exertion.  He had the same symptoms before when he needed bypass surgery. Labs unremarkable patient will be admitted to medicine and cardiology consult in the morning  Final Clinical Impressions(s) / ED Diagnoses   Final diagnoses:  None    New Prescriptions New Prescriptions   No medications on file  The chart was scribed for me under my direct supervision.  I personally performed the history, physical, and medical decision making and all procedures in the evaluation of this patient.Milton Ferguson, MD 10/06/16 0981    Milton Ferguson, MD 10/06/16 [phone]

## 2016-10-06 DIAGNOSIS — I10 Essential (primary) hypertension: Secondary | ICD-10-CM

## 2016-10-06 DIAGNOSIS — I2510 Atherosclerotic heart disease of native coronary artery without angina pectoris: Secondary | ICD-10-CM | POA: Diagnosis not present

## 2016-10-06 DIAGNOSIS — R0602 Shortness of breath: Secondary | ICD-10-CM

## 2016-10-06 DIAGNOSIS — I119 Hypertensive heart disease without heart failure: Secondary | ICD-10-CM | POA: Diagnosis not present

## 2016-10-06 DIAGNOSIS — I25118 Atherosclerotic heart disease of native coronary artery with other forms of angina pectoris: Secondary | ICD-10-CM | POA: Diagnosis not present

## 2016-10-06 DIAGNOSIS — R079 Chest pain, unspecified: Secondary | ICD-10-CM | POA: Diagnosis not present

## 2016-10-06 DIAGNOSIS — R0609 Other forms of dyspnea: Secondary | ICD-10-CM

## 2016-10-06 DIAGNOSIS — E7800 Pure hypercholesterolemia, unspecified: Secondary | ICD-10-CM

## 2016-10-06 DIAGNOSIS — C329 Malignant neoplasm of larynx, unspecified: Secondary | ICD-10-CM | POA: Diagnosis not present

## 2016-10-06 DIAGNOSIS — J386 Stenosis of larynx: Secondary | ICD-10-CM | POA: Diagnosis not present

## 2016-10-06 LAB — TROPONIN I
Troponin I: <0.03 ng/mL (ref <0.03)
Troponin I: <0.03 ng/mL (ref <0.03)
Troponin I: <0.03 ng/mL (ref <0.03)

## 2016-10-06 MED ORDER — ASPIRIN EC 81 MG PO TBEC
81.0000 mg | DELAYED_RELEASE_TABLET | Freq: Every day | ORAL | Status: DC
  Administered 2016-10-06: 81 mg via ORAL
  Filled 2016-10-06: qty 1

## 2016-10-06 MED ORDER — SODIUM CHLORIDE 0.9% FLUSH: 3.0000 mL | INTRAVENOUS | Status: DC | PRN

## 2016-10-06 MED ORDER — SODIUM CHLORIDE 0.9% FLUSH
3.0000 mL | Freq: Two times a day (BID) | INTRAVENOUS | Status: DC
  Administered 2016-10-06 – 2016-10-07 (×2): 3 mL via INTRAVENOUS

## 2016-10-06 MED ORDER — ACETAMINOPHEN 325 MG PO TABS: 650.0000 mg | ORAL_TABLET | ORAL | Status: DC | PRN

## 2016-10-06 MED ORDER — SODIUM CHLORIDE 0.9 % WEIGHT BASED INFUSION
1.0000 mL/kg/h | INTRAVENOUS | Status: DC
  Administered 2016-10-06 – 2016-10-07 (×2): 1 mL/kg/h via INTRAVENOUS

## 2016-10-06 MED ORDER — SODIUM CHLORIDE 0.9 % IV SOLN: 250.0000 mL | INTRAVENOUS | Status: DC | PRN

## 2016-10-06 MED ORDER — METOPROLOL TARTRATE 25 MG PO TABS
25.0000 mg | ORAL_TABLET | Freq: Two times a day (BID) | ORAL | Status: DC
  Administered 2016-10-06 – 2016-10-08 (×5): 25 mg via ORAL
  Filled 2016-10-06 (×6): qty 1

## 2016-10-06 MED ORDER — DIPHENHYDRAMINE HCL 50 MG/ML IJ SOLN
25.0000 mg | Freq: Once | INTRAMUSCULAR | Status: AC
  Administered 2016-10-06: 25 mg via INTRAVENOUS
  Filled 2016-10-06: qty 1

## 2016-10-06 MED ORDER — HYDRALAZINE HCL 25 MG PO TABS: 25.0000 mg | ORAL_TABLET | Freq: Four times a day (QID) | ORAL | Status: DC | PRN

## 2016-10-06 MED ORDER — PAROXETINE HCL 20 MG PO TABS
20.0000 mg | ORAL_TABLET | Freq: Every day | ORAL | Status: DC
  Administered 2016-10-06 – 2016-10-07 (×4): 20 mg via ORAL
  Filled 2016-10-06 (×5): qty 1

## 2016-10-06 MED ORDER — ASPIRIN 81 MG PO CHEW
81.0000 mg | CHEWABLE_TABLET | ORAL | Status: AC
  Administered 2016-10-07: 81 mg via ORAL
  Filled 2016-10-06: qty 1

## 2016-10-06 MED ORDER — ENOXAPARIN SODIUM 40 MG/0.4ML ~~LOC~~ SOLN
40.0000 mg | SUBCUTANEOUS | Status: DC
  Administered 2016-10-06 – 2016-10-08 (×3): 40 mg via SUBCUTANEOUS
  Filled 2016-10-06 (×3): qty 0.4

## 2016-10-06 MED ORDER — ONDANSETRON HCL 4 MG/2ML IJ SOLN: 4.0000 mg | Freq: Four times a day (QID) | INTRAMUSCULAR | Status: DC | PRN

## 2016-10-06 NOTE — Progress Notes (Signed)
Pt requesting paxil 20mg  po for restless legs.  Informed pt that med is due tonight at bedtime.  Says wants it early.  Notified M[place], [place] and he instructed it was ok to give to pt.

## 2016-10-06 NOTE — Progress Notes (Signed)
Subjective: Kenneth [name] was hospitalized after presenting with chest pain and shortness of breath. He described pain in his left upper chest and shoulder and shortness of breath and fatigue which were reminiscent of his symptoms prior to his bypass surgery. He denies any diaphoresis or vomiting. Symptoms lasted all day yesterday. He denies chest pain now.  Objective: Vital signs in last 24 hours: Vitals:   10/06/16 0004 10/06/16 0123 10/06/16 0130 10/06/16 0327  BP: 141/76 161/87 163/96 (!) 144/66  Pulse: 66 65 64 72  Resp: 15 15 18 15   Temp:    98 F (36.7 C)  TempSrc:    Oral  SpO2: 97% 99% 97% 99%  Weight:      Height:       Weight change:  No intake or output data in the 24 hours ending 10/06/16 0737  Physical Exam: Alert and in no distress. Lungs clear. Heart regular with no murmurs. Abdomen soft and nontender.  EKG reveals normal sinus rhythm with a right bundle branch block.  Lab Results:    Results for orders placed or performed during the hospital encounter of 10/05/16 (from the past 24 hour(s))  Basic metabolic panel     Status: Abnormal   Collection Time: 10/05/16  6:32 PM  Result Value Ref Range   Sodium 136 135 - 145 mmol/L   Potassium 3.9 3.5 - 5.1 mmol/L   Chloride 100 (L) 101 - 111 mmol/L   CO2 28 22 - 32 mmol/L   Glucose, Bld 78 65 - 99 mg/dL   BUN 17 6 - 20 mg/dL   Creatinine, Ser 1.36 (H) 0.61 - 1.24 mg/dL   Calcium 9.5 8.9 - 10.3 mg/dL   GFR calc non Af Amer 49 (L) >60 mL/min   GFR calc Af Amer 56 (L) >60 mL/min   Anion gap 8 5 - 15  CBC     Status: None   Collection Time: 10/05/16  6:32 PM  Result Value Ref Range   WBC 10.1 4.0 - 10.5 K/uL   RBC 4.50 4.22 - 5.81 MIL/uL   Hemoglobin 14.4 13.0 - 17.0 g/dL   HCT 42.1 39.0 - 52.0 %   MCV 93.6 78.0 - 100.0 fL   MCH 32.0 26.0 - 34.0 pg   MCHC 34.2 30.0 - 36.0 g/dL   RDW 13.4 11.5 - 15.5 %   Platelets 273 150 - 400 K/uL  Troponin I     Status: None   Collection Time: 10/05/16  6:32 PM  Result  Value Ref Range   Troponin I <0.03 <0.03 ng/mL  Hepatic function panel     Status: Abnormal   Collection Time: 10/05/16  6:32 PM  Result Value Ref Range   Total Protein 7.7 6.5 - 8.1 g/dL   Albumin 4.2 3.5 - 5.0 g/dL   AST 24 15 - 41 U/L   ALT 29 17 - 63 U/L   Alkaline Phosphatase 118 38 - 126 U/L   Total Bilirubin 0.4 0.3 - 1.2 mg/dL   Bilirubin, Direct <0.1 (L) 0.1 - 0.5 mg/dL   Indirect Bilirubin NOT CALCULATED 0.3 - 0.9 mg/dL  D-dimer, quantitative (not at [facility])     Status: Abnormal   Collection Time: 10/05/16  6:32 PM  Result Value Ref Range   D-Dimer, Quant 0.60 (H) 0.00 - 0.50 ug/mL-FEU  Brain natriuretic peptide     Status: None   Collection Time: 10/05/16  6:32 PM  Result Value Ref Range   B Natriuretic Peptide 52.0  0.0 - 100.0 pg/mL  Troponin I (q 6hr x 3)     Status: None   Collection Time: 10/06/16  1:40 AM  Result Value Ref Range   Troponin I <0.03 <0.03 ng/mL  Troponin I (q 6hr x 3)     Status: None   Collection Time: 10/06/16  6:46 AM  Result Value Ref Range   Troponin I <0.03 <0.03 ng/mL     ABGS No results for input(s): PHART, PO2ART, TCO2, HCO3 in the last 72 hours.  Invalid input(s): PCO2 CULTURES No results found for this or any previous visit (from the past 240 hour(s)). Studies/Results: Dg Chest 2 View  Result Date: 10/05/2016 CLINICAL DATA:  Shortness of Breath EXAM: CHEST  2 VIEW COMPARISON:  12/05/2015 FINDINGS: Postsurgical changes are again seen. Cardiac shadow is stable. The lungs are well aerated with mild chronic scarring. No acute infiltrate or sizable effusion is noted. No bony abnormality is seen. IMPRESSION: No active cardiopulmonary disease. Electronically Signed   By: Inez Catalina M.D.   On: 10/05/2016 18:19   Ct Angio Chest Pe W And/or Wo Contrast  Result Date: 10/05/2016 CLINICAL DATA:  Shortness of breath on exertion. Global weakness and fatigue. Elevated D-dimer. EXAM: CT ANGIOGRAPHY CHEST WITH CONTRAST TECHNIQUE: Multidetector CT  imaging of the chest was performed using the standard protocol during bolus administration of intravenous contrast. Multiplanar CT image reconstructions and MIPs were obtained to evaluate the vascular anatomy. CONTRAST:  100 mL Isovue 370 COMPARISON:  None. FINDINGS: Cardiovascular: Satisfactory opacification of the pulmonary arteries to the segmental level. No evidence of pulmonary embolism. Normal heart size. No pericardial effusion. Postoperative changes in the mediastinum consistent with coronary artery bypass grafts. Normal caliber thoracic aorta with scattered calcification. No aneurysm or dissection. Mediastinum/Nodes: No enlarged mediastinal, hilar, or axillary lymph nodes. Thyroid gland, trachea, and esophagus demonstrate no significant findings. Lungs/Pleura: Focal linear scarring in the right middle lobe and left lingula. No consolidation or airspace disease. No pleural effusions. No pneumothorax. Airways are patent. Upper Abdomen: Surgical absence of the gallbladder. Musculoskeletal: Degenerative changes in the spine. No destructive bone lesions. Review of the MIP images confirms the above findings. IMPRESSION: No evidence of significant pulmonary embolus. Scarring in the lungs without evidence of active pulmonary disease. Electronically Signed   By: Lucienne Capers M.D.   On: 10/05/2016 23:11   Micro Results: No results found for this or any previous visit (from the past 240 hour(s)). Studies/Results: Dg Chest 2 View  Result Date: 10/05/2016 CLINICAL DATA:  Shortness of Breath EXAM: CHEST  2 VIEW COMPARISON:  12/05/2015 FINDINGS: Postsurgical changes are again seen. Cardiac shadow is stable. The lungs are well aerated with mild chronic scarring. No acute infiltrate or sizable effusion is noted. No bony abnormality is seen. IMPRESSION: No active cardiopulmonary disease. Electronically Signed   By: Inez Catalina M.D.   On: 10/05/2016 18:19   Ct Angio Chest Pe W And/or Wo Contrast  Result Date:  10/05/2016 CLINICAL DATA:  Shortness of breath on exertion. Global weakness and fatigue. Elevated D-dimer. EXAM: CT ANGIOGRAPHY CHEST WITH CONTRAST TECHNIQUE: Multidetector CT imaging of the chest was performed using the standard protocol during bolus administration of intravenous contrast. Multiplanar CT image reconstructions and MIPs were obtained to evaluate the vascular anatomy. CONTRAST:  100 mL Isovue 370 COMPARISON:  None. FINDINGS: Cardiovascular: Satisfactory opacification of the pulmonary arteries to the segmental level. No evidence of pulmonary embolism. Normal heart size. No pericardial effusion. Postoperative changes in the mediastinum consistent with  coronary artery bypass grafts. Normal caliber thoracic aorta with scattered calcification. No aneurysm or dissection. Mediastinum/Nodes: No enlarged mediastinal, hilar, or axillary lymph nodes. Thyroid gland, trachea, and esophagus demonstrate no significant findings. Lungs/Pleura: Focal linear scarring in the right middle lobe and left lingula. No consolidation or airspace disease. No pleural effusions. No pneumothorax. Airways are patent. Upper Abdomen: Surgical absence of the gallbladder. Musculoskeletal: Degenerative changes in the spine. No destructive bone lesions. Review of the MIP images confirms the above findings. IMPRESSION: No evidence of significant pulmonary embolus. Scarring in the lungs without evidence of active pulmonary disease. Electronically Signed   By: Lucienne Capers M.D.   On: 10/05/2016 23:11   Medications:  I have reviewed the patient's current medications Scheduled Meds: . aspirin EC  81 mg Oral QHS  . enoxaparin (LOVENOX) injection  40 mg Subcutaneous Q24H  . metoprolol tartrate  25 mg Oral BID  . PARoxetine  20 mg Oral QHS   Continuous Infusions: PRN Meds:.acetaminophen, hydrALAZINE, ondansetron (ZOFRAN) IV   Assessment/Plan: #1. Coronary artery disease now with chest pain and shortness of breath reminiscent of  pre-bypass symptoms. Troponins have been stable at 0.03 x 3. Consult cardiology. He has had difficulty tolerating multiple antihypertensive drug classes including thiazide diuretics, angiotensin blockers and calcium channel blockers. Procardia XL was recently tried but was held last night. Metoprolol was started however he has had poor tolerance with this in the past. He is not on a statin because of statin intolerance. Continue aspirin. #2. Hypertension. #3. Renal insufficiency. Creatinine at 1.3 is above his baseline. We will check his office record on this. #4. 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 Further allergy assessment is pending. Active Problems:   Malignant neoplasm of larynx (HCC)   CAD in native artery   Chest pain   Glottic stenosis   Essential hypertension     LOS: 0 days   Azan Maneri 10/06/2016, 7:37 AM

## 2016-10-06 NOTE — Progress Notes (Signed)
Pt. With HR down to 39 unsustained. Pt. Asleep in bed. VSS. Pt. Asymptomatic. Denies discomfort. HR now in 7s. BP med scheduled. On call for [facility] paged to make aware. RN will continue to monitor.

## 2016-10-06 NOTE — Progress Notes (Signed)
Patient to be transferred to [facility], report given Karie Kirks on 3E.  Waiting for [facility] for transport.

## 2016-10-06 NOTE — Progress Notes (Signed)
Pt's D. Dimer 0.60 obtained on 10/06/16 at [place] in [facility].  Dr.Mandandawat made aware and instructed that he is aware.  Also made assigned nurse made aware.  Karie Kirks, Therapist, sports.

## 2016-10-06 NOTE — Consult Note (Signed)
Cardiology Consultation   Patient ID: Kenneth [name]; [id number]; Jun 22, 1941   Admit date: 10/05/2016 Date of Consult: 10/06/2016  Referring MD:Dr. Willey Blade Cardiologist: Dr. [name] Consulting Cardiologist: Dr. Bronson Ing  Patient Care Team: [name], MD as PCP - General (Internal Medicine) Arnoldo Lenis, MD as Consulting Physician (Cardiology)    Reason for Consultation: chest pain/dyspnea on exertion.   History of Present Illness: Kenneth [name] is a 76 y.o. male with a hx of  CAD status post CABG in 2003, repeat cath 2007 with patent grafts, low risk nuclear stress test 2013 2015 without ischemia 2-D echo 11/1998 LVEF. Also has hypertension, HLD intolerant to statin, carotid stenosis with prior carotid stenting, chronic dyspnea on exertion, laryngeal carcinoma. He has been intolerant to multiple antihypertensive medications including thiazide diuretics, Ace inhibitors(lip swelling) and calcium channel blockers. He's also had poor tolerance to metoprolol(bradycardia baseline).  Patient comes in with recurrent chest pain similar to pre-CABG pain. EKG shows normal sinus rhythm with incomplete right bundle branch block and ST depression anteriorly which is new since 11/2015. Troponin negative 3. Creatinine 1.32. D-dimer elevated, CT negative for pulmonary embolus.  Patient has had chronic dyspnea on exertion evaluated by Dr. Melvyn Novas and sent to [facility]. 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 If he needs at ET tube they recommend 4.0 ETT via fiberoptic intubation.   His dyspnea on exertion has been upgrade until a week ago. Now he complains of dyspnea and fatigue with walking 50 ft. He says it's exactly what happened prior to CABG. He also has had chest tightness at rest but doesn't know if it's from heart or GERD. He took NTG a couple weeks ago and called EMS but didn't come to hospital. No exertional chest pain. BP has been up and down since valsartan  stopped because of lip swelling. Now on Procardia.  Past Medical History:  Diagnosis Date  . Abnormal chest x-ray   . Acute renal failure ([place])   . Anxiety   . ASCVD (arteriosclerotic cardiovascular disease)   . Coronary artery disease   . Depression   . GERD (gastroesophageal reflux disease)   . High triglycerides   . Hypertension   . Laryngeal carcinoma ([place])   . Nephrolithiasis   . Peripheral vascular disease ([place])   . Stroke ([place])   . Tobacco abuse   . Tracheal stenosis     Past Surgical History:  Procedure Laterality Date  . CARDIAC SURGERY    . CAROTID STENT Right 06-13-12  . CAROTID STENT INSERTION N/A 06/13/2012   Procedure: CAROTID STENT INSERTION;  Surgeon: Serafina Mitchell, MD;  Location: [facility] CATH LAB;  Service: Cardiovascular;  Laterality: N/A;  . CHOLECYSTECTOMY  2007   Gall Bladder  . COLONOSCOPY  11/10/2011   Procedure: COLONOSCOPY;  Surgeon: Daneil Dolin, MD;  Location: [facility] ENDO SUITE;  Service: Endoscopy;  Laterality: N/A;  8:30 AM  . CORONARY ARTERY BYPASS GRAFT    . EYE SURGERY    . PARTIAL LARYNGECTOMY  1998      Home Meds: Prior to Admission medications   Medication Sig Start Date End Date Taking? Authorizing Provider  aspirin 81 MG tablet Take 81 mg by mouth at bedtime.    Yes Historical Provider, MD  diphenhydrAMINE (BENADRYL) 25 MG tablet Take 25 mg by mouth every 6 (six) hours as needed (for oral swelling).   Yes Historical Provider, MD  esomeprazole (NEXIUM) 20 MG capsule Take 2 Take 30-60  min before first meal of the day Patient taking differently: Take 20 mg by mouth every morning.  12/05/15  Yes Tanda Rockers, MD  NIFEdipine (PROCARDIA XL/ADALAT-CC) 60 MG 24 hr tablet Take 60 mg by mouth daily.   Yes Historical Provider, MD  nitroGLYCERIN (NITROSTAT) 0.4 MG SL tablet Place 1 tablet (0.4 mg total) under the tongue every 5 (five) minutes as needed for chest pain. 12/19/15 04/11/20 Yes Lendon Colonel, NP  Omega-3 Fatty Acids (OMEGA-3 FISH OIL  PO) Take 1 capsule by mouth daily.   Yes Historical Provider, MD  PARoxetine (PAXIL) 20 MG tablet Take 20 mg by mouth at bedtime.    Yes Historical Provider, MD    Current Medications: . aspirin EC  81 mg Oral QHS  . enoxaparin (LOVENOX) injection  40 mg Subcutaneous Q24H  . metoprolol tartrate  25 mg Oral BID  . PARoxetine  20 mg Oral QHS     Allergies:    Allergies  Allergen Reactions  . Erythromycin   . Lorazepam Other (See Comments)    REACTION: Alters mental status. "Turns into maniac"  . Penicillins Hives    Has patient had a PCN reaction causing immediate rash, facial/tongue/throat swelling, SOB or lightheadedness with hypotension: Yes Has patient had a PCN reaction causing severe rash involving mucus membranes or skin necrosis: No Has patient had a PCN reaction that required hospitalization No Has patient had a PCN reaction occurring within the last 10 years: No If all of the above answers are "NO", then may proceed with Cephalosporin use.   . Sulfonamide Derivatives Other (See Comments)    REACTION: Unknown to patient. States that MD states allergy per medical records  . Statins Itching and Rash    Social History:   The patient  reports that he quit smoking about 46 years ago. His smoking use included Cigarettes. He started smoking about 63 years ago. He has a 15.00 pack-year smoking history. He quit smokeless tobacco use about 14 years ago. His smokeless tobacco use included Chew. He reports that he does not drink alcohol or use drugs.    Family History:   The patient's family history includes Cancer in his brother and brother; Dementia in his mother; Heart disease in his brother and father; Hyperlipidemia in his brother.   ROS:  Please see the history of present illness.  Review of Systems  Constitution: Negative.  HENT: Negative.   Cardiovascular: Positive for chest pain and dyspnea on exertion.  Respiratory: Positive for shortness of breath.   Endocrine: Negative.    Hematologic/Lymphatic: Negative.   Musculoskeletal: Negative.   Gastrointestinal: Positive for heartburn.  Genitourinary: Negative.   Neurological: Negative.    All other ROS reviewed and negative.      Vital Signs: Blood pressure (!) 144/66, pulse 72, temperature 98 F (36.7 C), temperature source Oral, resp. rate 15, height 5\' 6"  (1.676 m), weight 180 lb (81.6 kg), SpO2 99 %.   PHYSICAL EXAM: General:  Well nourished, well developed, in no acute distress, hoarse voice HEENT: normal Lymph: no adenopathy Neck: no JVD Endocrine:  No thryomegaly Vascular: No carotid bruits; FA pulses 2+ bilaterally without bruits  Cardiac:  RRR; normal S1, S2; S4,no murmur, rub, bruit, thrill, or heave Lungs:  clear to auscultation bilaterally, no wheezing, rhonchi or rales  Abd: soft, nontender, no hepatomegaly  Ext: no edema, Good distal pulses bilaterally Musculoskeletal:  No deformities, BUE and BLE strength normal and equal Skin: warm and dry  Neuro:  CNs 2-12 intact, no focal abnormalities noted Psych:  Normal affect    EKG:  Normal sinus rhythm with incomplete right bundle branch block and anterior ST depression which is new-personally reviewed  Telemetry: sinus bradycardia in 40's  Labs:  Recent Labs  10/05/16 1832 10/06/16 0140 10/06/16 0646  TROPONINI <0.03 <0.03 <0.03   Lab Results  Component Value Date   WBC 10.1 10/05/2016   HGB 14.4 10/05/2016   HCT 42.1 10/05/2016   MCV 93.6 10/05/2016   PLT 273 10/05/2016    Recent Labs Lab 10/05/16 1832  NA 136  K 3.9  CL 100*  CO2 28  BUN 17  CREATININE 1.36*  CALCIUM 9.5  PROT 7.7  BILITOT 0.4  ALKPHOS 118  ALT 29  AST 24  GLUCOSE 78   Lab Results  Component Value Date   CHOL 181 08/13/2011   HDL 35 (L) 08/13/2011   LDLCALC 99 08/13/2011   TRIG 236 (H) 08/13/2011   Lab Results  Component Value Date   DDIMER 0.60 (H) 10/05/2016    Radiology/Studies:  Dg Chest 2 View  Result Date: 10/05/2016 CLINICAL  DATA:  Shortness of Breath EXAM: CHEST  2 VIEW COMPARISON:  12/05/2015 FINDINGS: Postsurgical changes are again seen. Cardiac shadow is stable. The lungs are well aerated with mild chronic scarring. No acute infiltrate or sizable effusion is noted. No bony abnormality is seen. IMPRESSION: No active cardiopulmonary disease. Electronically Signed   By: Inez Catalina M.D.   On: 10/05/2016 18:19   Ct Angio Chest Pe W And/or Wo Contrast  Result Date: 10/05/2016 CLINICAL DATA:  Shortness of breath on exertion. Global weakness and fatigue. Elevated D-dimer. EXAM: CT ANGIOGRAPHY CHEST WITH CONTRAST TECHNIQUE: Multidetector CT imaging of the chest was performed using the standard protocol during bolus administration of intravenous contrast. Multiplanar CT image reconstructions and MIPs were obtained to evaluate the vascular anatomy. CONTRAST:  100 mL Isovue 370 COMPARISON:  None. FINDINGS: Cardiovascular: Satisfactory opacification of the pulmonary arteries to the segmental level. No evidence of pulmonary embolism. Normal heart size. No pericardial effusion. Postoperative changes in the mediastinum consistent with coronary artery bypass grafts. Normal caliber thoracic aorta with scattered calcification. No aneurysm or dissection. Mediastinum/Nodes: No enlarged mediastinal, hilar, or axillary lymph nodes. Thyroid gland, trachea, and esophagus demonstrate no significant findings. Lungs/Pleura: Focal linear scarring in the right middle lobe and left lingula. No consolidation or airspace disease. No pleural effusions. No pneumothorax. Airways are patent. Upper Abdomen: Surgical absence of the gallbladder. Musculoskeletal: Degenerative changes in the spine. No destructive bone lesions. Review of the MIP images confirms the above findings. IMPRESSION: No evidence of significant pulmonary embolus. Scarring in the lungs without evidence of active pulmonary disease. Electronically Signed   By: Lucienne Capers M.D.   On:  10/05/2016 23:11   07/2011 MPI   Tomographic views were obtained using the short axis, vertical long axis, and horizontal long axis planes. No significant, reversible perfusion defects are noted to indicate ischemia.  Gated imaging reveals an EDV of 59, ESV of 18, T I D ratio of 0.80, and LVEF of 69%.  IMPRESSION: Low risk exercise/Lexiscan Myoview as outlined. No diagnostic ST- segment changes or arrhythmias were noted. There is evidence of soft tissue attenuation, however no frank scar or ischemia. LVEF is normal at 69%.  04/2012 Echo   LVEF 65-70%, grade II diastolic dysfunction,     2-D echo 12/23/15 Study Conclusions   - Left ventricle: The cavity size was normal. Wall thickness  was   increased in a pattern of mild LVH. Systolic function was normal.   The estimated ejection fraction was in the range of 60% to 65%.   Wall motion was normal; there were no regional wall motion   abnormalities. Left ventricular diastolic function parameters   were normal. - Aortic valve: Mildly calcified annulus. Mildly thickened   leaflets. There was mild regurgitation. Valve area (VTI): 2.32   cm^2. Valve area (Vmax): 1.98 cm^2. - Left atrium: The atrium was mildly dilated. - Technically adequate study.    PROBLEM LIST:  Active Problems:   Malignant neoplasm of larynx (HCC)   CAD in native artery   Chest pain   Glottic stenosis   Essential hypertension     ASSESSMENT AND PLAN:  Chest pain at rest but dyspnea on exertion and fatigue with walking 50 ft similar to pre CABG symptoms. EKG with new IRBBB and ST depression ant. Crt mildly elevated. Suspect he needs evaluated with cardiac catheterization. Normal LVEF 60-65% 11/2015  I have reviewed the risks, indications, and alternatives to angioplasty and stenting with the patient. Risks include but are not limited to bleeding, infection, vascular injury, stroke, myocardial infection, arrhythmia, kidney injury, radiation-related injury in  the case of prolonged fluoroscopy use, emergency cardiac surgery, and death. The patient understands the risks of serious complication is low (<0%) and he agrees to proceed.     CAD status post CABG in 2003, cath 2013 with patent grafts, normal nuclear stress test 2015  History of carotid stenosis status post stenting in 2013  Hypertension intolerant to multiple medications including ACE inhibitor(angioedema), thiazide diuretics, calcium channel blockers(now on procardia), metoprolol(bradycardia)  HLD intolerant to statins  Chronic dyspnea on exertion but dramatically worsened past 1 week.  History of laryngeal carcinoma, previous surgery, now with scarring. 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 If he needs at ET tube they recommend 4.0 ETT via fiberoptic intubation.     Signed, Ermalinda Barrios, PA-C  10/06/2016 7:57 AM   The patient was seen and examined, and I agree with the history, physical exam, assessment and plan as documented above, with modifications as noted below.  76 yr old male with aforementioned history (CABG 2003, repeat cath in 2007 with patent grafts, low risk nuclear stress tests without ischemia in 2013 and 2015, normal LV systolic function and regional wall motion by echocardiogram 12/23/15, LVEF 60-65%) presented with increasing exertional dyspnea over the past 2 days. This usually occurs when walking up an incline and those symptoms began at least 6 months ago, but yesterday he was walking on level ground from his barn to his house (75-100 steps) and he felt markedly short of breath and weak. Denies chest pain. Also felt this way when walking around the house and showering in the past 2 days. While he has a h/o glottic stenosis, he was evaluated on 3/8 at [facility] and this was found to be stable  Says "This is exactly how I felt before my bypass surgery".  Troponins normal. ECG on admission showed NSR with incomplete RBBB (110  ms) and precordial ST depressions with T wave inversions in V1-3 which are new.  He is on ASA and is statin intolerant.   I have discussed with family and patient is agreeable to be transferred to [facility] for coronary angiography, as symptoms are highly suspicious for CCS class III angina.  Kate Sable, MD, [facility]  10/06/2016 10:21 AM

## 2016-10-06 NOTE — H&P (Signed)
TRH H&P    Patient Demographics:    Kenneth [name], is a 76 y.o. male  MRN: [id number]  DOB - September 18, 1940  Admit Date - 10/05/2016  Referring MD/NP/PA: Dr Roderic Palau  Outpatient Primary MD for the patient is [name], MD  Patient coming from: Home  Chief Complaint  Patient presents with  . Shortness of Breath      HPI:    Kenneth [name]  is a 76 y.o. male, With history of CAD status post CABG in 2003, hypertension, hyperlipidemia, bilateral carotid stenting, laryngeal cancer, glottic stenosis , vocal cord dysfunction  who came to the hospital for evaluation of fatigue, general is weakness and shortness of breath on exertion. Patient has history of hypertension and 3 weeks ago dose of nifedipine was changed from 30-to 60 mg daily by his PCP. Patient says that he was taking, 2 x 30 mg tablets of nifedipine for past 3 weeks and today he started taking nifedipine 60 mg . Also patient has been complaining of lip swelling, last time he had this episode was 2 days ago, with both upper and lower lip swelled.    Patient had this lip swelling earlier, and at that time his losartan was changed to nifedipine.   he denies nausea vomiting or diarrhea. Does complain of chest discomfort but not sure whether this is coming from hiatal  hernia. He feels that the symptoms are very similar to when he had bypass surgery years ago. Patient says that he has difficulty walking now even for 50 feet as he would get extremely tired and has to stop.  In the ED cart enzymes are negative. O2 sats are 99% on room air. CT angiogram was negative for pulmonary embolism. Chest x-ray showed no acute findings. BNP 52   Review of system s:      A full 10 point Review of Systems was done, except as stated above, all other Review of Systems were negative.   With Past History of the following :    Past Medical History:  Diagnosis Date  .  Abnormal chest x-ray   . Acute renal failure ([place])   . Anxiety   . ASCVD (arteriosclerotic cardiovascular disease)   . Coronary artery disease   . Depression   . GERD (gastroesophageal reflux disease)   . High triglycerides   . Hypertension   . Laryngeal carcinoma ([place])   . Nephrolithiasis   . Peripheral vascular disease ([place])   . Stroke ([place])   . Tobacco abuse   . Tracheal stenosis       Past Surgical History:  Procedure Laterality Date  . CARDIAC SURGERY    . CAROTID STENT Right 06-13-12  . CAROTID STENT INSERTION N/A 06/13/2012   Procedure: CAROTID STENT INSERTION;  Surgeon: Serafina Mitchell, MD;  Location: [facility] CATH LAB;  Service: Cardiovascular;  Laterality: N/A;  . CHOLECYSTECTOMY  2007   Gall Bladder  . COLONOSCOPY  11/10/2011   Procedure: COLONOSCOPY;  Surgeon: Daneil Dolin, MD;  Location: [facility] ENDO SUITE;  Service: Endoscopy;  Laterality: N/A;  8:30 AM  . CORONARY ARTERY BYPASS GRAFT    . EYE SURGERY    . PARTIAL LARYNGECTOMY  1998      Social History:      Social History  Substance Use Topics  . Smoking status: Former Smoker    Packs/day: 1.00    Years: 15.00    Types: Cigarettes    Start date: 04/22/1953    Quit date: 07/26/1970  . Smokeless tobacco: Former Systems developer    Types: Chew    Quit date: 05/05/2002  . Alcohol use No       Family History :     Family History  Problem Relation Age of Onset  . Dementia Mother   . Heart disease Father   . Cancer Brother   . Heart disease Brother   . Hyperlipidemia Brother   . Cancer Brother   . Colon cancer Neg Hx       Home Medications:   Prior to Admission medications   Medication Sig Start Date End Date Taking? Authorizing Provider  aspirin 81 MG tablet Take 81 mg by mouth at bedtime.    Yes Historical Provider, MD  diphenhydrAMINE (BENADRYL) 25 MG tablet Take 25 mg by mouth every 6 (six) hours as needed (for oral swelling).   Yes Historical Provider, MD  esomeprazole (NEXIUM) 20 MG capsule Take 2 Take  30-60 min before first meal of the day Patient taking differently: Take 20 mg by mouth every morning.  12/05/15  Yes Tanda Rockers, MD  NIFEdipine (PROCARDIA XL/ADALAT-CC) 60 MG 24 hr tablet Take 60 mg by mouth daily.   Yes Historical Provider, MD  nitroGLYCERIN (NITROSTAT) 0.4 MG SL tablet Place 1 tablet (0.4 mg total) under the tongue every 5 (five) minutes as needed for chest pain. 12/19/15 04/11/20 Yes Lendon Colonel, NP  Omega-3 Fatty Acids (OMEGA-3 FISH OIL PO) Take 1 capsule by mouth daily.   Yes Historical Provider, MD  PARoxetine (PAXIL) 20 MG tablet Take 20 mg by mouth at bedtime.    Yes Historical Provider, MD     Allergies:     Allergies  Allergen Reactions  . Erythromycin   . Lorazepam Other (See Comments)    REACTION: Alters mental status. "Turns into maniac"  . Penicillins Hives    Has patient had a PCN reaction causing immediate rash, facial/tongue/throat swelling, SOB or lightheadedness with hypotension: Yes Has patient had a PCN reaction causing severe rash involving mucus membranes or skin necrosis: No Has patient had a PCN reaction that required hospitalization No Has patient had a PCN reaction occurring within the last 10 years: No If all of the above answers are "NO", then may proceed with Cephalosporin use.   . Sulfonamide Derivatives Other (See Comments)    REACTION: Unknown to patient. States that MD states allergy per medical records  . Statins Itching and Rash     Physical Exam:   Vitals  Blood pressure 141/76, pulse 66, temperature 98.6 F (37 C), temperature source Oral, resp. rate 15, height 5\' 6"  (1.676 m), weight 81.6 kg (180 lb), SpO2 97 %.  1.  General: Appears in no acute distress  2. Psychiatric:  Intact judgement and  insight, awake alert, oriented x 3.  3. Neurologic: No focal neurological deficits, all cranial nerves intact.Strength 5/5 all 4 extremities, sensation intact all 4 extremities, plantars down going.  4. Eyes :   anicteric sclerae, moist conjunctivae with no lid lag. PERRLA.  5. ENMT:  Oropharynx clear with moist  mucous membranes and good dentition  6. Neck:  supple, no cervical lymphadenopathy appriciated, No thyromegaly  7. Respiratory : Normal respiratory effort, good air movement bilaterally,clear to  auscultation bilaterally  8. Cardiovascular : RRR, no gallops, rubs or murmurs, no leg edema  9. Gastrointestinal:  Positive bowel sounds, abdomen soft, non-tender to palpation,no hepatosplenomegaly, no rigidity or guarding       10. Skin:  No cyanosis, normal texture and turgor, no rash, lesions or ulcers  11.Musculoskeletal:  Good muscle tone,  joints appear normal , no effusions,  normal range of motion    Data Review:    CBC  Recent Labs Lab 10/05/16 1832  WBC 10.1  HGB 14.4  HCT 42.1  PLT 273  MCV 93.6  MCH 32.0  MCHC 34.2  RDW 13.4   ------------------------------------------------------------------------------------------------------------------  Chemistries   Recent Labs Lab 10/05/16 1832  NA 136  K 3.9  CL 100*  CO2 28  GLUCOSE 78  BUN 17  CREATININE 1.36*  CALCIUM 9.5  AST 24  ALT 29  ALKPHOS 118  BILITOT 0.4   ------------------------------------------------------------------------------------------------------------------  ------------------------------------------------------------------------------------------------------------------ GFR: Estimated Creatinine Clearance: 45.6 mL/min (by C-G formula based on SCr of 1.36 mg/dL (H)). Liver Function Tests:  Recent Labs Lab 10/05/16 1832  AST 24  ALT 29  ALKPHOS 118  BILITOT 0.4  PROT 7.7  ALBUMIN 4.2   No results for input(s): LIPASE, AMYLASE in the last 168 hours. No results for input(s): AMMONIA in the last 168 hours. Coagulation Profile: No results for input(s): INR, PROTIME in the last 168 hours. Cardiac Enzymes:  Recent Labs Lab 10/05/16 1832  TROPONINI <0.03     --------------------------------------------------------------------------------------------------------------- Urine analysis:    Component Value Date/Time   LABSPEC 1.018 06/01/2010 1113   PHURINE 6.5 06/01/2010 1113   GLUCOSEU neg 06/01/2010 1113   PROTEINUR neg 06/01/2010 1113   NITRITE neg 06/01/2010 1113      Imaging Results:    Dg Chest 2 View  Result Date: 10/05/2016 CLINICAL DATA:  Shortness of Breath EXAM: CHEST  2 VIEW COMPARISON:  12/05/2015 FINDINGS: Postsurgical changes are again seen. Cardiac shadow is stable. The lungs are well aerated with mild chronic scarring. No acute infiltrate or sizable effusion is noted. No bony abnormality is seen. IMPRESSION: No active cardiopulmonary disease. Electronically Signed   By: Inez Catalina M.D.   On: 10/05/2016 18:19   Ct Angio Chest Pe W And/or Wo Contrast  Result Date: 10/05/2016 CLINICAL DATA:  Shortness of breath on exertion. Global weakness and fatigue. Elevated D-dimer. EXAM: CT ANGIOGRAPHY CHEST WITH CONTRAST TECHNIQUE: Multidetector CT imaging of the chest was performed using the standard protocol during bolus administration of intravenous contrast. Multiplanar CT image reconstructions and MIPs were obtained to evaluate the vascular anatomy. CONTRAST:  100 mL Isovue 370 COMPARISON:  None. FINDINGS: Cardiovascular: Satisfactory opacification of the pulmonary arteries to the segmental level. No evidence of pulmonary embolism. Normal heart size. No pericardial effusion. Postoperative changes in the mediastinum consistent with coronary artery bypass grafts. Normal caliber thoracic aorta with scattered calcification. No aneurysm or dissection. Mediastinum/Nodes: No enlarged mediastinal, hilar, or axillary lymph nodes. Thyroid gland, trachea, and esophagus demonstrate no significant findings. Lungs/Pleura: Focal linear scarring in the right middle lobe and left lingula. No consolidation or airspace disease. No pleural effusions. No  pneumothorax. Airways are patent. Upper Abdomen: Surgical absence of the gallbladder. Musculoskeletal: Degenerative changes in the spine. No destructive bone lesions. Review of the MIP images confirms the above findings. IMPRESSION: No evidence  of significant pulmonary embolus. Scarring in the lungs without evidence of active pulmonary disease. Electronically Signed   By: Lucienne Capers M.D.   On: 10/05/2016 23:11    My personal review of EKG: Rhythm NSR, incomplete right bundle branch block   Assessment & Plan:    Active Problems:   Malignant neoplasm of larynx (HCC)   CAD in native artery   Chest pain   Glottic stenosis   Essential hypertension   1. Fatigue/dyspnea on exertion- chest x-ray shows no acute findings, BNP 52. CTA is negative for pulmonary embolism. ?? Medication side effect, will discontinue nifedipine as the symptoms got worse today when he took 60 mg tablet. We'll consult cardiology in a.m. for further adjustment of medications. 2. Chest pain- patient also complains of chest pain, will cycle troponin every 6 hours 3. Monitor closely on telemetry. 3. Hypertension- will start metoprolol 25 mini gram by mouth twice a day, also start hydralazine 25 mg by mouth every 6 hours when necessary for BP greater than 160/100. 4. CAD, status post CABG - stable, continue aspirin 81 mg by mouth daily. 5. Left swelling /? Angioedema -occurred 2 days ago and resolved. No lip swelling noted at this time could be secondary to nifedipine , as it is listed as one of the side effects.     DVT Prophylaxis-   Lovenox   AM Labs Ordered, also please review Full Orders  Family Communication: Admission, patients condition and plan of care including tests being ordered have been discussed with the patient and his son at bedside who indicate understanding and agree with the plan and Code Status.  Code Status:  Full code  Admission status: Observation    Time spent in minutes : 60  minutes   Ervie Mccard S M.D on 10/06/2016 at 12:48 AM  Between 7am to 7pm - Pager - [phone]. After 7pm go to [email or web address] - password [facility]  [facility] - Office  [phone]

## 2016-10-06 NOTE — Progress Notes (Signed)
Called re: elevation of D-Dimer, ? need to hold Cors.   Negative CT PE yesterday. D-Dimer is non-specifically elevated, not a contraindication to Cors in the AM. Please keep NPO s MN.   Allyson Sabal, MD Cardiology

## 2016-10-06 NOTE — Progress Notes (Signed)
Pt transferred to [facility] via [place] in stable condition.

## 2016-10-07 ENCOUNTER — Encounter (HOSPITAL_COMMUNITY)
Admission: EM | Disposition: A | Discharge status: Home / Self Care | Payer: Self-pay | Source: Home / Self Care | Attending: Emergency Medicine

## 2016-10-07 DIAGNOSIS — I2510 Atherosclerotic heart disease of native coronary artery without angina pectoris: Secondary | ICD-10-CM

## 2016-10-07 HISTORY — PX: LEFT HEART CATH AND CORS/GRAFTS ANGIOGRAPHY: CATH118250

## 2016-10-07 LAB — PROTIME-INR
INR: 1.05
Prothrombin Time: 13.7 seconds (ref 11.4–15.2)

## 2016-10-07 SURGERY — LEFT HEART CATH AND CORS/GRAFTS ANGIOGRAPHY

## 2016-10-07 MED ORDER — ACETAMINOPHEN 325 MG PO TABS: 650.0000 mg | ORAL_TABLET | ORAL | Status: DC | PRN

## 2016-10-07 MED ORDER — IOPAMIDOL (ISOVUE-370) INJECTION 76%
INTRAVENOUS | Status: AC
  Filled 2016-10-07: qty 125

## 2016-10-07 MED ORDER — SODIUM CHLORIDE 0.9% FLUSH
3.0000 mL | Freq: Two times a day (BID) | INTRAVENOUS | Status: DC
  Administered 2016-10-07 – 2016-10-08 (×2): 3 mL via INTRAVENOUS

## 2016-10-07 MED ORDER — MIDAZOLAM HCL 2 MG/2ML IJ SOLN
INTRAMUSCULAR | Status: DC | PRN
  Administered 2016-10-07 (×2): 1 mg via INTRAVENOUS

## 2016-10-07 MED ORDER — FENTANYL CITRATE (PF) 100 MCG/2ML IJ SOLN
INTRAMUSCULAR | Status: AC
  Filled 2016-10-07: qty 2

## 2016-10-07 MED ORDER — MIDAZOLAM HCL 2 MG/2ML IJ SOLN
INTRAMUSCULAR | Status: AC
  Filled 2016-10-07: qty 2

## 2016-10-07 MED ORDER — SODIUM CHLORIDE 0.9 % IV SOLN: 250.0000 mL | INTRAVENOUS | Status: DC | PRN

## 2016-10-07 MED ORDER — SODIUM CHLORIDE 0.9% FLUSH: 3.0000 mL | INTRAVENOUS | Status: DC | PRN

## 2016-10-07 MED ORDER — ASPIRIN 81 MG PO CHEW
81.0000 mg | CHEWABLE_TABLET | Freq: Every day | ORAL | Status: DC
  Administered 2016-10-08: 81 mg via ORAL
  Filled 2016-10-07: qty 1

## 2016-10-07 MED ORDER — SODIUM CHLORIDE 0.9 % IV SOLN: INTRAVENOUS | Status: DC

## 2016-10-07 MED ORDER — HEPARIN (PORCINE) IN NACL 2-0.9 UNIT/ML-% IJ SOLN
INTRAMUSCULAR | Status: AC
  Filled 2016-10-07: qty 1000

## 2016-10-07 MED ORDER — LIDOCAINE HCL (PF) 1 % IJ SOLN
INTRAMUSCULAR | Status: AC
  Filled 2016-10-07: qty 30

## 2016-10-07 MED ORDER — LIDOCAINE HCL (PF) 1 % IJ SOLN
INTRAMUSCULAR | Status: DC | PRN
  Administered 2016-10-07: 22 mL

## 2016-10-07 MED ORDER — ONDANSETRON HCL 4 MG/2ML IJ SOLN: 4.0000 mg | Freq: Four times a day (QID) | INTRAMUSCULAR | Status: DC | PRN

## 2016-10-07 MED ORDER — IOPAMIDOL (ISOVUE-370) INJECTION 76%
INTRAVENOUS | Status: DC | PRN
  Administered 2016-10-07: 90 mL via INTRA_ARTERIAL

## 2016-10-07 MED ORDER — FENTANYL CITRATE (PF) 100 MCG/2ML IJ SOLN
INTRAMUSCULAR | Status: DC | PRN
  Administered 2016-10-07: 50 ug via INTRAVENOUS

## 2016-10-07 MED ORDER — HEPARIN (PORCINE) IN NACL 2-0.9 UNIT/ML-% IJ SOLN
INTRAMUSCULAR | Status: DC | PRN
  Administered 2016-10-07: 1000 mL

## 2016-10-07 SURGICAL SUPPLY — 8 items
CATH INFINITI 5FR MULTPACK ANG (CATHETERS) ×1 IMPLANT
KIT HEART LEFT (KITS) ×2 IMPLANT
PACK CARDIAC CATHETERIZATION (CUSTOM PROCEDURE TRAY) ×2 IMPLANT
SHEATH PINNACLE 5F 10CM (SHEATH) ×1 IMPLANT
SYR MEDRAD MARK V 150ML (SYRINGE) ×2 IMPLANT
TRANSDUCER W/STOPCOCK (MISCELLANEOUS) ×2 IMPLANT
TUBING CIL FLEX 10 FLL-RA (TUBING) ×2 IMPLANT
WIRE EMERALD 3MM-J .035X150CM (WIRE) ×2 IMPLANT

## 2016-10-07 NOTE — Progress Notes (Signed)
Pt accidentally pulled out iv with some bleeding noted and notified charge nurse to place PIV , pt is for cardiac cath.  Karie Kirks, Therapist, sports.

## 2016-10-07 NOTE — Progress Notes (Signed)
Pt's HR 56 and due for metoprolol 25mg  this am.  Also reported by nurse this am HR39.  Paged [place], [place].  Awaiting return call.  Karie Kirks, Therapist, sports.

## 2016-10-07 NOTE — Progress Notes (Signed)
Pt HR 56 at 0950.  Fluctuate around 40-60 this.  Informed tT. Carlota Raspberry PA, instructed to hold med.  Will continue to monitor.  Karie Kirks, Therapist, sports.

## 2016-10-07 NOTE — H&P (View-Only) (Signed)
Cardiology Consultation   Patient ID: NASON CONRADT; [id number]; 24-Jan-1941   Admit date: 10/05/2016 Date of Consult: 10/06/2016  Referring MD:Dr. Willey Blade Cardiologist: Dr. [name] Consulting Cardiologist: Dr. Bronson Ing  Patient Care Team: [name], MD as PCP - General (Internal Medicine) Arnoldo Lenis, MD as Consulting Physician (Cardiology)    Reason for Consultation: chest pain/dyspnea on exertion.   History of Present Illness: DEV DHONDT is a 76 y.o. male with a hx of  CAD status post CABG in 2003, repeat cath 2007 with patent grafts, low risk nuclear stress test 2013 2015 without ischemia 2-D echo 11/1998 LVEF. Also has hypertension, HLD intolerant to statin, carotid stenosis with prior carotid stenting, chronic dyspnea on exertion, laryngeal carcinoma. He has been intolerant to multiple antihypertensive medications including thiazide diuretics, Ace inhibitors(lip swelling) and calcium channel blockers. He's also had poor tolerance to metoprolol(bradycardia baseline).  Patient comes in with recurrent chest pain similar to pre-CABG pain. EKG shows normal sinus rhythm with incomplete right bundle branch block and ST depression anteriorly which is new since 11/2015. Troponin negative 3. Creatinine 1.32. D-dimer elevated, CT negative for pulmonary embolus.  Patient has had chronic dyspnea on exertion evaluated by Dr. Melvyn Novas and sent to [facility]. 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 If he needs at ET tube they recommend 4.0 ETT via fiberoptic intubation.   His dyspnea on exertion has been upgrade until a week ago. Now he complains of dyspnea and fatigue with walking 50 ft. He says it's exactly what happened prior to CABG. He also has had chest tightness at rest but doesn't know if it's from heart or GERD. He took NTG a couple weeks ago and called EMS but didn't come to hospital. No exertional chest pain. BP has been up and down since valsartan  stopped because of lip swelling. Now on Procardia.  Past Medical History:  Diagnosis Date  . Abnormal chest x-ray   . Acute renal failure ([place])   . Anxiety   . ASCVD (arteriosclerotic cardiovascular disease)   . Coronary artery disease   . Depression   . GERD (gastroesophageal reflux disease)   . High triglycerides   . Hypertension   . Laryngeal carcinoma ([place])   . Nephrolithiasis   . Peripheral vascular disease ([place])   . Stroke ([place])   . Tobacco abuse   . Tracheal stenosis     Past Surgical History:  Procedure Laterality Date  . CARDIAC SURGERY    . CAROTID STENT Right 06-13-12  . CAROTID STENT INSERTION N/A 06/13/2012   Procedure: CAROTID STENT INSERTION;  Surgeon: Serafina Mitchell, MD;  Location: [facility] CATH LAB;  Service: Cardiovascular;  Laterality: N/A;  . CHOLECYSTECTOMY  2007   Gall Bladder  . COLONOSCOPY  11/10/2011   Procedure: COLONOSCOPY;  Surgeon: Daneil Dolin, MD;  Location: [facility] ENDO SUITE;  Service: Endoscopy;  Laterality: N/A;  8:30 AM  . CORONARY ARTERY BYPASS GRAFT    . EYE SURGERY    . PARTIAL LARYNGECTOMY  1998      Home Meds: Prior to Admission medications   Medication Sig Start Date End Date Taking? Authorizing Provider  aspirin 81 MG tablet Take 81 mg by mouth at bedtime.    Yes Historical Provider, MD  diphenhydrAMINE (BENADRYL) 25 MG tablet Take 25 mg by mouth every 6 (six) hours as needed (for oral swelling).   Yes Historical Provider, MD  esomeprazole (NEXIUM) 20 MG capsule Take 2 Take 30-60  min before first meal of the day Patient taking differently: Take 20 mg by mouth every morning.  12/05/15  Yes Tanda Rockers, MD  NIFEdipine (PROCARDIA XL/ADALAT-CC) 60 MG 24 hr tablet Take 60 mg by mouth daily.   Yes Historical Provider, MD  nitroGLYCERIN (NITROSTAT) 0.4 MG SL tablet Place 1 tablet (0.4 mg total) under the tongue every 5 (five) minutes as needed for chest pain. 12/19/15 04/11/20 Yes Lendon Colonel, NP  Omega-3 Fatty Acids (OMEGA-3 FISH OIL  PO) Take 1 capsule by mouth daily.   Yes Historical Provider, MD  PARoxetine (PAXIL) 20 MG tablet Take 20 mg by mouth at bedtime.    Yes Historical Provider, MD    Current Medications: . aspirin EC  81 mg Oral QHS  . enoxaparin (LOVENOX) injection  40 mg Subcutaneous Q24H  . metoprolol tartrate  25 mg Oral BID  . PARoxetine  20 mg Oral QHS     Allergies:    Allergies  Allergen Reactions  . Erythromycin   . Lorazepam Other (See Comments)    REACTION: Alters mental status. "Turns into maniac"  . Penicillins Hives    Has patient had a PCN reaction causing immediate rash, facial/tongue/throat swelling, SOB or lightheadedness with hypotension: Yes Has patient had a PCN reaction causing severe rash involving mucus membranes or skin necrosis: No Has patient had a PCN reaction that required hospitalization No Has patient had a PCN reaction occurring within the last 10 years: No If all of the above answers are "NO", then may proceed with Cephalosporin use.   . Sulfonamide Derivatives Other (See Comments)    REACTION: Unknown to patient. States that MD states allergy per medical records  . Statins Itching and Rash    Social History:   The patient  reports that he quit smoking about 46 years ago. His smoking use included Cigarettes. He started smoking about 63 years ago. He has a 15.00 pack-year smoking history. He quit smokeless tobacco use about 14 years ago. His smokeless tobacco use included Chew. He reports that he does not drink alcohol or use drugs.    Family History:   The patient's family history includes Cancer in his brother and brother; Dementia in his mother; Heart disease in his brother and father; Hyperlipidemia in his brother.   ROS:  Please see the history of present illness.  Review of Systems  Constitution: Negative.  HENT: Negative.   Cardiovascular: Positive for chest pain and dyspnea on exertion.  Respiratory: Positive for shortness of breath.   Endocrine: Negative.    Hematologic/Lymphatic: Negative.   Musculoskeletal: Negative.   Gastrointestinal: Positive for heartburn.  Genitourinary: Negative.   Neurological: Negative.    All other ROS reviewed and negative.      Vital Signs: Blood pressure (!) 144/66, pulse 72, temperature 98 F (36.7 C), temperature source Oral, resp. rate 15, height 5\' 6"  (1.676 m), weight 180 lb (81.6 kg), SpO2 99 %.   PHYSICAL EXAM: General:  Well nourished, well developed, in no acute distress, hoarse voice HEENT: normal Lymph: no adenopathy Neck: no JVD Endocrine:  No thryomegaly Vascular: No carotid bruits; FA pulses 2+ bilaterally without bruits  Cardiac:  RRR; normal S1, S2; S4,no murmur, rub, bruit, thrill, or heave Lungs:  clear to auscultation bilaterally, no wheezing, rhonchi or rales  Abd: soft, nontender, no hepatomegaly  Ext: no edema, Good distal pulses bilaterally Musculoskeletal:  No deformities, BUE and BLE strength normal and equal Skin: warm and dry  Neuro:  CNs 2-12 intact, no focal abnormalities noted Psych:  Normal affect    EKG:  Normal sinus rhythm with incomplete right bundle branch block and anterior ST depression which is new-personally reviewed  Telemetry: sinus bradycardia in 40's  Labs:  Recent Labs  10/05/16 1832 10/06/16 0140 10/06/16 0646  TROPONINI <0.03 <0.03 <0.03   Lab Results  Component Value Date   WBC 10.1 10/05/2016   HGB 14.4 10/05/2016   HCT 42.1 10/05/2016   MCV 93.6 10/05/2016   PLT 273 10/05/2016    Recent Labs Lab 10/05/16 1832  NA 136  K 3.9  CL 100*  CO2 28  BUN 17  CREATININE 1.36*  CALCIUM 9.5  PROT 7.7  BILITOT 0.4  ALKPHOS 118  ALT 29  AST 24  GLUCOSE 78   Lab Results  Component Value Date   CHOL 181 08/13/2011   HDL 35 (L) 08/13/2011   LDLCALC 99 08/13/2011   TRIG 236 (H) 08/13/2011   Lab Results  Component Value Date   DDIMER 0.60 (H) 10/05/2016    Radiology/Studies:  Dg Chest 2 View  Result Date: 10/05/2016 CLINICAL  DATA:  Shortness of Breath EXAM: CHEST  2 VIEW COMPARISON:  12/05/2015 FINDINGS: Postsurgical changes are again seen. Cardiac shadow is stable. The lungs are well aerated with mild chronic scarring. No acute infiltrate or sizable effusion is noted. No bony abnormality is seen. IMPRESSION: No active cardiopulmonary disease. Electronically Signed   By: Inez Catalina M.D.   On: 10/05/2016 18:19   Ct Angio Chest Pe W And/or Wo Contrast  Result Date: 10/05/2016 CLINICAL DATA:  Shortness of breath on exertion. Global weakness and fatigue. Elevated D-dimer. EXAM: CT ANGIOGRAPHY CHEST WITH CONTRAST TECHNIQUE: Multidetector CT imaging of the chest was performed using the standard protocol during bolus administration of intravenous contrast. Multiplanar CT image reconstructions and MIPs were obtained to evaluate the vascular anatomy. CONTRAST:  100 mL Isovue 370 COMPARISON:  None. FINDINGS: Cardiovascular: Satisfactory opacification of the pulmonary arteries to the segmental level. No evidence of pulmonary embolism. Normal heart size. No pericardial effusion. Postoperative changes in the mediastinum consistent with coronary artery bypass grafts. Normal caliber thoracic aorta with scattered calcification. No aneurysm or dissection. Mediastinum/Nodes: No enlarged mediastinal, hilar, or axillary lymph nodes. Thyroid gland, trachea, and esophagus demonstrate no significant findings. Lungs/Pleura: Focal linear scarring in the right middle lobe and left lingula. No consolidation or airspace disease. No pleural effusions. No pneumothorax. Airways are patent. Upper Abdomen: Surgical absence of the gallbladder. Musculoskeletal: Degenerative changes in the spine. No destructive bone lesions. Review of the MIP images confirms the above findings. IMPRESSION: No evidence of significant pulmonary embolus. Scarring in the lungs without evidence of active pulmonary disease. Electronically Signed   By: Lucienne Capers M.D.   On:  10/05/2016 23:11   07/2011 MPI   Tomographic views were obtained using the short axis, vertical long axis, and horizontal long axis planes. No significant, reversible perfusion defects are noted to indicate ischemia.  Gated imaging reveals an EDV of 59, ESV of 18, T I D ratio of 0.80, and LVEF of 69%.  IMPRESSION: Low risk exercise/Lexiscan Myoview as outlined. No diagnostic ST- segment changes or arrhythmias were noted. There is evidence of soft tissue attenuation, however no frank scar or ischemia. LVEF is normal at 69%.  04/2012 Echo   LVEF 65-70%, grade II diastolic dysfunction,     2-D echo 12/23/15 Study Conclusions   - Left ventricle: The cavity size was normal. Wall thickness  was   increased in a pattern of mild LVH. Systolic function was normal.   The estimated ejection fraction was in the range of 60% to 65%.   Wall motion was normal; there were no regional wall motion   abnormalities. Left ventricular diastolic function parameters   were normal. - Aortic valve: Mildly calcified annulus. Mildly thickened   leaflets. There was mild regurgitation. Valve area (VTI): 2.32   cm^2. Valve area (Vmax): 1.98 cm^2. - Left atrium: The atrium was mildly dilated. - Technically adequate study.    PROBLEM LIST:  Active Problems:   Malignant neoplasm of larynx (HCC)   CAD in native artery   Chest pain   Glottic stenosis   Essential hypertension     ASSESSMENT AND PLAN:  Chest pain at rest but dyspnea on exertion and fatigue with walking 50 ft similar to pre CABG symptoms. EKG with new IRBBB and ST depression ant. Crt mildly elevated. Suspect he needs evaluated with cardiac catheterization. Normal LVEF 60-65% 11/2015  I have reviewed the risks, indications, and alternatives to angioplasty and stenting with the patient. Risks include but are not limited to bleeding, infection, vascular injury, stroke, myocardial infection, arrhythmia, kidney injury, radiation-related injury in  the case of prolonged fluoroscopy use, emergency cardiac surgery, and death. The patient understands the risks of serious complication is low (<2%) and he agrees to proceed.     CAD status post CABG in 2003, cath 2013 with patent grafts, normal nuclear stress test 2015  History of carotid stenosis status post stenting in 2013  Hypertension intolerant to multiple medications including ACE inhibitor(angioedema), thiazide diuretics, calcium channel blockers(now on procardia), metoprolol(bradycardia)  HLD intolerant to statins  Chronic dyspnea on exertion but dramatically worsened past 1 week.  History of laryngeal carcinoma, previous surgery, now with scarring. 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 If he needs at ET tube they recommend 4.0 ETT via fiberoptic intubation.     Signed, Ermalinda Barrios, PA-C  10/06/2016 7:57 AM   The patient was seen and examined, and I agree with the history, physical exam, assessment and plan as documented above, with modifications as noted below.  76 yr old male with aforementioned history (CABG 2003, repeat cath in 2007 with patent grafts, low risk nuclear stress tests without ischemia in 2013 and 2015, normal LV systolic function and regional wall motion by echocardiogram 12/23/15, LVEF 60-65%) presented with increasing exertional dyspnea over the past 2 days. This usually occurs when walking up an incline and those symptoms began at least 6 months ago, but yesterday he was walking on level ground from his barn to his house (75-100 steps) and he felt markedly short of breath and weak. Denies chest pain. Also felt this way when walking around the house and showering in the past 2 days. While he has a h/o glottic stenosis, he was evaluated on 3/8 at [facility] and this was found to be stable  Says "This is exactly how I felt before my bypass surgery".  Troponins normal. ECG on admission showed NSR with incomplete RBBB (110  ms) and precordial ST depressions with T wave inversions in V1-3 which are new.  He is on ASA and is statin intolerant.   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  Kate Sable, MD, [facility]  10/06/2016 10:21 AM

## 2016-10-07 NOTE — Interval H&P Note (Signed)
History and Physical Interval Note:  10/07/2016 1:48 PM  Kenneth [name]  has presented today for surgery, with the diagnosis of cp  The various methods of treatment have been discussed with the patient and family. After consideration of risks, benefits and other options for treatment, the patient has consented to  Procedure(s): Left Heart Cath and Cors/Grafts Angiography (N/A) as a surgical intervention .  The patient's history has been reviewed, patient examined, no change in status, stable for surgery.  I have reviewed the patient's chart and labs.  Questions were answered to the patient's satisfaction.     Shelva Majestic

## 2016-10-07 NOTE — Interval H&P Note (Signed)
Cath Lab Visit (complete for each Cath Lab visit)  Clinical Evaluation Leading to the Procedure:   ACS: No.  Non-ACS:    Anginal Classification: CCS III  Anti-ischemic medical therapy: Maximal Therapy (2 or more classes of medications)  Non-Invasive Test Results: No non-invasive testing performed  Prior CABG: Previous CABG      History and Physical Interval Note:  10/07/2016 1:49 PM  Tyson Dense  has presented today for surgery, with the diagnosis of cp  The various methods of treatment have been discussed with the patient and family. After consideration of risks, benefits and other options for treatment, the patient has consented to  Procedure(s): Left Heart Cath and Cors/Grafts Angiography (N/A) as a surgical intervention .  The patient's history has been reviewed, patient examined, no change in status, stable for surgery.  I have reviewed the patient's chart and labs.  Questions were answered to the patient's satisfaction.     Shelva Majestic

## 2016-10-07 NOTE — Progress Notes (Signed)
Cardiology paged to inform of pts. HR.

## 2016-10-07 NOTE — Progress Notes (Signed)
5Fr. Arterial sheath removed from right groin.  Manual pressure held for 20 minutes without complications, VSS; vascular site level 0.  Gauze and tegaderm dressing applied to vascular site.  Bedrest started at 1515.  Post sheath pull education given to patient.  Report called to RN and patient to go back to 3E10.

## 2016-10-07 NOTE — Progress Notes (Signed)
Wrong patient vitals 

## 2016-10-08 ENCOUNTER — Encounter (HOSPITAL_COMMUNITY): Payer: Self-pay | Admitting: Cardiovascular Disease

## 2016-10-08 DIAGNOSIS — R079 Chest pain, unspecified: Secondary | ICD-10-CM | POA: Diagnosis not present

## 2016-10-08 DIAGNOSIS — J386 Stenosis of larynx: Secondary | ICD-10-CM | POA: Diagnosis not present

## 2016-10-08 DIAGNOSIS — C329 Malignant neoplasm of larynx, unspecified: Secondary | ICD-10-CM | POA: Diagnosis not present

## 2016-10-08 DIAGNOSIS — I10 Essential (primary) hypertension: Secondary | ICD-10-CM | POA: Diagnosis not present

## 2016-10-08 LAB — CBC
HCT: 37.5 % — ABNORMAL LOW (ref 39.0–52.0)
Hemoglobin: 12.1 g/dL — ABNORMAL LOW (ref 13.0–17.0)
MCH: 30.9 pg (ref 26.0–34.0)
MCHC: 32.3 g/dL (ref 30.0–36.0)
MCV: 95.9 fL (ref 78.0–100.0)
Platelets: 217 10*3/uL (ref 150–400)
RBC: 3.91 MIL/uL — ABNORMAL LOW (ref 4.22–5.81)
RDW: 13.8 % (ref 11.5–15.5)
WBC: 7.9 10*3/uL (ref 4.0–10.5)

## 2016-10-08 LAB — BASIC METABOLIC PANEL
Anion gap: 9 (ref 5–15)
BUN: 16 mg/dL (ref 6–20)
CO2: 28 mmol/L (ref 22–32)
Calcium: 9.1 mg/dL (ref 8.9–10.3)
Chloride: 104 mmol/L (ref 101–111)
Creatinine, Ser: 1.21 mg/dL (ref 0.61–1.24)
GFR calc Af Amer: >60 mL/min (ref >60)
GFR calc non Af Amer: 56 mL/min — ABNORMAL LOW (ref >60)
Glucose, Bld: 98 mg/dL (ref 65–99)
Potassium: 4 mmol/L (ref 3.5–5.1)
Sodium: 141 mmol/L (ref 135–145)

## 2016-10-08 MED ORDER — METOPROLOL TARTRATE 25 MG PO TABS: 25.0000 mg | ORAL_TABLET | Freq: Two times a day (BID) | ORAL | 5 refills | Status: DC

## 2016-10-08 MED ORDER — METOPROLOL TARTRATE 25 MG PO TABS: 25.0000 mg | ORAL_TABLET | Freq: Two times a day (BID) | ORAL | 6 refills | Status: DC

## 2016-10-08 NOTE — Discharge Summary (Signed)
Discharge Summary    Patient ID: Kenneth [name],  MRN: [id number], DOB/AGE: 78-Nov-1942 78 y.o.  Admit date: 10/05/2016 Discharge date: 10/08/2016  Primary Care Provider: [name] Primary Cardiologist: Dr. [name]   Discharge Diagnoses    Principal Problem:   Chest pain Active Problems:   Malignant neoplasm of larynx (HCC)   HLD (hyperlipidemia)   GASTROESOPHAGEAL REFLUX DISEASE   CAD in native artery   Carotid stenosis, bilateral   Glottic stenosis   Essential hypertension   Allergies Allergies  Allergen Reactions  . Erythromycin   . Lorazepam Other (See Comments)    REACTION: Alters mental status. "Turns into maniac"  . Penicillins Hives    Has patient had a PCN reaction causing immediate rash, facial/tongue/throat swelling, SOB or lightheadedness with hypotension: Yes Has patient had a PCN reaction causing severe rash involving mucus membranes or skin necrosis: No Has patient had a PCN reaction that required hospitalization No Has patient had a PCN reaction occurring within the last 10 years: No If all of the above answers are "NO", then may proceed with Cephalosporin use.   . Sulfonamide Derivatives Other (See Comments)    REACTION: Unknown to patient. States that MD states allergy per medical records  . Statins Itching and Rash     History of Present Illness     Kenneth [name] is a 76 y.o. male with a history of CAD s/p CABG (2003), HTN, HLD (intolerant to statins), carotid artery disease s/p previous stenting, chronic dyspnea on exertion, laryngeal carcinoma and multiple medication intolerances who presented to [facility] on 10/06/16 with chest pain/DOE.   He has a hx of CAD s/p CABG in 2003 with a sequential LIMA to the diagonal and LAD and RIMA to the obtuse marginal branch of the circumflex vessel. Repeat cath 2007 with patent grafts, low risk nuclear stress test 2013 2015 without ischemia. He has been intolerant to multiple antihypertensive medications  including thiazide diuretics, Ace inhibitors/ valsartan (lip swelling)and calcium channel blockers. He's also had poor tolerance to metoprolol(bradycardia baseline). Patient has had chronic dyspnea on exertion evaluated by Dr. Melvyn Novas and sent to [facility]. 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 If he needs at ET tube they recommend 4.0 ETT via fiberoptic intubation.   He presented to [facility] on 10/06/16 with chest pain and progressive dyspnea similar to pre-CABG pain that was worsening in frequency and severity. EKG showed normal sinus rhythm with incomplete right bundle branch block and ST depression anteriorly which was new since 11/2015. Troponin negative 3. Creatinine 1.32. D-dimer elevated, CT negative for pulmonary embolus.  He was transferred to [facility] for coronary angiography given sx concerning for [place].  Hospital Course     Consultants: none  Chest pain/dyspnea: he had a LHC yesterday which showed normal LV systolic function without segmental wall motion abnormalities and patent grafts with normal LVEDP. Medical therapy was recommended. CT chest unremarkable - but does have subglottic stenosis. He will continue on ASA. He is intolerant to statins. He was placed on Lopressor 25mg  BID while here and tolerated it well (previously listed as poorly tolerated). We will discharge him on this and stop home nicardipine. He is intolerant to statins. Dr. Debara Pickett recommended updated PFTs for further assessment of ongoing dyspnea. However, he has already been evaluated by Dr Melvyn Novas, see below.   Dyspnea: below pulled from Dr Melvyn Novas 11/2015 office note.  - PFT's  09/24/15   FEV1 2.08(82 % ) ratio  81  p 10 % improvement from saba p no rx prior to study with DLCO  58 % correct to 61 % for alv volume   - 12/05/2015  Walked RA x 3 laps @ 185 ft each stopped due to  End of study, nl pace, no desat,  Mild sob - Spirometry 12/05/2015  FEV1 1.78 (64%)  Ratio 74  s truncation Per Dr. Melvyn Novas "So there  is no evidence of a lung problem here and despite absence of truncation on pfts his problem appears to be entirely upper airway in terms of symptoms and should try max gerd rx"   HTN: Bp well controlled  HLD: intolerant to statins. May want to consider PCSK9 I therapy.   The patient has had an uncomplicated hospital course and is recovering well. The femoral catheter site is stable. He has been seen by Dr. Debara Pickett today and deemed ready for discharge home. All follow-up appointments have been scheduled. Discharge medications are listed below.  _____________  Discharge Vitals Blood pressure (!) 144/79, pulse 65, temperature 98 F (36.7 C), temperature source Oral, resp. rate 18, height 5\' 6"  (1.676 m), weight 176 lb 9.6 oz (80.1 kg), SpO2 98 %.  Filed Weights   10/06/16 1614 10/07/16 0536 10/08/16 0543  Weight: 175 lb 3.2 oz (79.5 kg) 174 lb 6.4 oz (79.1 kg) 176 lb 9.6 oz (80.1 kg)    Labs & Radiologic Studies     CBC  Recent Labs  10/05/16 1832 10/08/16 0605  WBC 10.1 7.9  HGB 14.4 12.1*  HCT 42.1 37.5*  MCV 93.6 95.9  PLT 273 606   Basic Metabolic Panel  Recent Labs  10/05/16 1832 10/08/16 0605  NA 136 141  K 3.9 4.0  CL 100* 104  CO2 28 28  GLUCOSE 78 98  BUN 17 16  CREATININE 1.36* 1.21  CALCIUM 9.5 9.1   Liver Function Tests  Recent Labs  10/05/16 1832  AST 24  ALT 29  ALKPHOS 118  BILITOT 0.4  PROT 7.7  ALBUMIN 4.2   No results for input(s): LIPASE, AMYLASE in the last 72 hours. Cardiac Enzymes  Recent Labs  10/06/16 0140 10/06/16 0646 10/06/16 1320  TROPONINI <0.03 <0.03 <0.03   BNP Invalid input(s): POCBNP D-Dimer  Recent Labs  10/05/16 1832  DDIMER 0.60*   Hemoglobin A1C No results for input(s): HGBA1C in the last 72 hours. Fasting Lipid Panel No results for input(s): CHOL, HDL, LDLCALC, TRIG, CHOLHDL, LDLDIRECT in the last 72 hours. Thyroid Function Tests No results for input(s): TSH, T4TOTAL, T3FREE, THYROIDAB in the last 72  hours.  Invalid input(s): FREET3  Dg Chest 2 View  Result Date: 10/05/2016 CLINICAL DATA:  Shortness of Breath EXAM: CHEST  2 VIEW COMPARISON:  12/05/2015 FINDINGS: Postsurgical changes are again seen. Cardiac shadow is stable. The lungs are well aerated with mild chronic scarring. No acute infiltrate or sizable effusion is noted. No bony abnormality is seen. IMPRESSION: No active cardiopulmonary disease. Electronically Signed   By: Inez Catalina M.D.   On: 10/05/2016 18:19   Ct Angio Chest Pe W And/or Wo Contrast  Result Date: 10/05/2016 CLINICAL DATA:  Shortness of breath on exertion. Global weakness and fatigue. Elevated D-dimer. EXAM: CT ANGIOGRAPHY CHEST WITH CONTRAST TECHNIQUE: Multidetector CT imaging of the chest was performed using the standard protocol during bolus administration of intravenous contrast. Multiplanar CT image reconstructions and MIPs were obtained to evaluate the vascular anatomy. CONTRAST:  100 mL Isovue 370 COMPARISON:  None. FINDINGS:  Cardiovascular: Satisfactory opacification of the pulmonary arteries to the segmental level. No evidence of pulmonary embolism. Normal heart size. No pericardial effusion. Postoperative changes in the mediastinum consistent with coronary artery bypass grafts. Normal caliber thoracic aorta with scattered calcification. No aneurysm or dissection. Mediastinum/Nodes: No enlarged mediastinal, hilar, or axillary lymph nodes. Thyroid gland, trachea, and esophagus demonstrate no significant findings. Lungs/Pleura: Focal linear scarring in the right middle lobe and left lingula. No consolidation or airspace disease. No pleural effusions. No pneumothorax. Airways are patent. Upper Abdomen: Surgical absence of the gallbladder. Musculoskeletal: Degenerative changes in the spine. No destructive bone lesions. Review of the MIP images confirms the above findings. IMPRESSION: No evidence of significant pulmonary embolus. Scarring in the lungs without evidence of  active pulmonary disease. Electronically Signed   By: Lucienne Capers M.D.   On: 10/05/2016 23:11     Diagnostic Studies/Procedures    10/07/16 Left Heart Cath and Cors/Grafts Angiography  Conclusion     LM lesion, 50 %stenosed.  Prox LAD to Mid LAD lesion, 70 %stenosed.  Ost 1st Mrg lesion, 100 %stenosed.  RIMA and is normal in caliber and anatomically normal.  RPDA-2 lesion, 50 %stenosed.  RPDA-1 lesion, 60 %stenosed.  LIMA.  The left ventricular systolic function is normal.  LV end diastolic pressure is normal.   Normal LV function without focal segmental wall motion abnormalities.  Multivessel  native CAD with 50% ostial stenosis of the LAD and diffuse 70% mid stenoses, occlusion of the OM1 vessel of the circumflex, and diffuse 60 and 50% PDA stenoses in the small caliber PDA vessel.  Patent sequential LIMA graft supplying a twin like diagonal and LAD system system.  Patent RIMA graft supplying the circumflex marginal vessel.  RECOMMENDATION: Medical therapy.     _____________    Disposition   Pt is being discharged home today in good condition.  Follow-up Plans & Appointments    Follow-up Information    Jory Sims, NP. Go on 10/25/2016.   Specialties:  Nurse Practitioner, Radiology, Cardiology Why:  @ 2:10pm  Contact information: [place] [address] [phone]            Discharge Medications     Medication List    STOP taking these medications   NIFEdipine 60 MG 24 hr tablet Commonly known as:  PROCARDIA XL/ADALAT-CC     TAKE these medications   aspirin 81 MG tablet Take 81 mg by mouth at bedtime.   diphenhydrAMINE 25 MG tablet Commonly known as:  BENADRYL Take 25 mg by mouth every 6 (six) hours as needed (for oral swelling).   esomeprazole 20 MG capsule Commonly known as:  NEXIUM Take 2 Take 30-60 min before first meal of the day What changed:  how much to take  how to take this  when to  take this  additional instructions   metoprolol tartrate 25 MG tablet Commonly known as:  LOPRESSOR Take 1 tablet (25 mg total) by mouth 2 (two) times daily.   nitroGLYCERIN 0.4 MG SL tablet Commonly known as:  NITROSTAT Place 1 tablet (0.4 mg total) under the tongue every 5 (five) minutes as needed for chest pain.   OMEGA-3 FISH OIL PO Take 1 capsule by mouth daily.   PARoxetine 20 MG tablet Commonly known as:  PAXIL Take 20 mg by mouth at bedtime.          Outstanding Labs/Studies   none  Duration of Discharge Encounter   Greater than 30 minutes including  physician time.  Signed, Angelena Form PA-C 10/08/2016, 1:13 PM

## 2016-10-08 NOTE — Discharge Instructions (Signed)

## 2016-10-08 NOTE — Progress Notes (Signed)
Patient is refusing SCDs.

## 2016-10-08 NOTE — Progress Notes (Signed)
Reviewed all discharge instructions.  Patient stated understanding.  No voiced complaints.

## 2016-10-08 NOTE — Progress Notes (Signed)
DAILY PROGRESS NOTE  Subjective:  No chest pain or further dyspnea. Admitted for progressive exertional dyspnea which was though to be his anginal equivalent. Had Hen[place] yesterday which showed normal LV systolic function without segmental wall motion abnormalities and patent grafts with normal LVEDP. Medical therapy was recommended. CT chest unremarkable - does have subglottic stenosis. Troponin negative x 3. No signs of CHF.  Objective:  Temp:  [97.5 F (36.4 C)-98.4 F (36.9 C)] 97.9 F (36.6 C) (03/16 0919) Pulse Rate:  [0-96] 63 (03/16 0919) Resp:  [8-105] 18 (03/16 0919) BP: (124-189)/(49-105) 145/62 (03/16 0919) SpO2:  [0 %-100 %] 96 % (03/16 0919) Weight:  [176 lb 9.6 oz (80.1 kg)] 176 lb 9.6 oz (80.1 kg) (03/16 0543) Weight change: 1 lb 6.4 oz (0.635 kg)  Intake/Output from previous day: 03/15 0701 - 03/16 0700 In: 1605 [I.V.:1605] Out: 1400 [Urine:1400]  Intake/Output from this shift: Total I/O In: 240 [P.O.:240] Out: -   Medications: No current facility-administered medications on file prior to encounter.    Current Outpatient Prescriptions on File Prior to Encounter  Medication Sig Dispense Refill  . aspirin 81 MG tablet Take 81 mg by mouth at bedtime.     Marland Kitchen esomeprazole (NEXIUM) 20 MG capsule Take 2 Take 30-60 min before first meal of the day (Patient taking differently: Take 20 mg by mouth every morning. )    . nitroGLYCERIN (NITROSTAT) 0.4 MG SL tablet Place 1 tablet (0.4 mg total) under the tongue every 5 (five) minutes as needed for chest pain. 25 tablet 3  . PARoxetine (PAXIL) 20 MG tablet Take 20 mg by mouth at bedtime.       Physical Exam: General appearance: alert and no distress Lungs: clear to auscultation bilaterally Heart: regular rate and rhythm Extremities: extremities normal, atraumatic, no cyanosis or edema and right femoral cath site without bruit or hematoma Neurologic: Grossly normal  Lab Results: Results for orders placed or performed  during the hospital encounter of 10/05/16 (from the past 48 hour(s))  Troponin I (q 6hr x 3)     Status: None   Collection Time: 10/06/16  1:20 PM  Result Value Ref Range   Troponin I <0.03 <0.03 ng/mL  Protime-INR     Status: None   Collection Time: 10/07/16  4:57 AM  Result Value Ref Range   Prothrombin Time 13.7 11.4 - 15.2 seconds   INR 1.05   CBC     Status: Abnormal   Collection Time: 10/08/16  6:05 AM  Result Value Ref Range   WBC 7.9 4.0 - 10.5 K/uL   RBC 3.91 (L) 4.22 - 5.81 MIL/uL   Hemoglobin 12.1 (L) 13.0 - 17.0 g/dL   HCT 37.5 (L) 39.0 - 52.0 %   MCV 95.9 78.0 - 100.0 fL   MCH 30.9 26.0 - 34.0 pg   MCHC 32.3 30.0 - 36.0 g/dL   RDW 13.8 11.5 - 15.5 %   Platelets 217 150 - 400 K/uL  Basic metabolic panel     Status: Abnormal   Collection Time: 10/08/16  6:05 AM  Result Value Ref Range   Sodium 141 135 - 145 mmol/L   Potassium 4.0 3.5 - 5.1 mmol/L   Chloride 104 101 - 111 mmol/L   CO2 28 22 - 32 mmol/L   Glucose, Bld 98 65 - 99 mg/dL   BUN 16 6 - 20 mg/dL   Creatinine, Ser 1.21 0.61 - 1.24 mg/dL   Calcium 9.1 8.9 - 10.3 mg/dL  GFR calc non Af Amer 56 (L) >60 mL/min   GFR calc Af Amer >60 >60 mL/min    Comment: (NOTE) The eGFR has been calculated using the CKD EPI equation. This calculation has not been validated in all clinical situations. eGFR's persistently <60 mL/min signify possible Chronic Kidney Disease.    Anion gap 9 5 - 15    Imaging: No results found.  Assessment:  1. Active Problems: 2.   Malignant neoplasm of larynx ([place]) 3.   CAD in native artery 4.   Chest pain 5.   Glottic stenosis 6.   Essential hypertension 7.   Plan:  1. No cardiac etiology of dyspnea identified. Would recommend outpatient PFT's. OK for d/c home today - will continue metoprolol as an outpatient (he will need an Rx). Follow-up with Dr. [name].  Time Spent Directly with Patient:  15 minutes  Length of Stay:  LOS: 0 days   Pixie Casino, MD,  [facility] Attending Cardiologist Sophia 10/08/2016, 11:08 AM

## 2016-10-24 NOTE — Progress Notes (Signed)
Cardiology Office Note   Date:  10/25/2016   ID:  Kenneth [name], DOB [birth year], MRN [id number]  PCP:  [name], MD  Cardiologist: Cloria Spring, NP   Chief Complaint  Patient presents with  . Coronary Artery Disease  . Chest Pain      History of Present Illness: Kenneth [name] is a 76 y.o. male who presents for post hospital follow up after admission to [facility] for complaints of chest pain. He has history of CAD, S/P CABG in 2003 with a sequential LIMA to the diagonal and LAD and RIMA to the obtuse marginal branch of the circumflex vessel. Repeat cath 2007 with patent grafts, low risk nuclear stress test 2013 2015 without ischemia.   Other history includes hypertension with intolerances to multiple medications including thiazide diuretics, Ace inhibitors/ valsartan (lip swelling)and calcium channel blockers. He's also had poor tolerance to metoprolol(bradycardia baseline).   Had cardiac cath on 10/07/2016  LM lesion, 50 %stenosed.  Prox LAD to Mid LAD lesion, 70 %stenosed.  Ost 1st Mrg lesion, 100 %stenosed.  RIMA and is normal in caliber and anatomically normal.  RPDA-2 lesion, 50 %stenosed.  RPDA-1 lesion, 60 %stenosed.  LIMA.  The left ventricular systolic function is normal.  LV end diastolic pressure is normal.   Normal LV function without focal segmental wall motion abnormalities.  Multivessel  native CAD with 50% ostial stenosis of the LAD and diffuse 70% mid stenoses, occlusion of the OM1 vessel of the circumflex, and diffuse 60 and 50% PDA stenoses in the small caliber PDA vessel. Patent sequential LIMA graft supplying a twin like diagonal and LAD system system. Patent RIMA graft supplying the circumflex marginal vessel.  He was recommended for ongoing medical therapy. His symptoms were felt to be related to GERD. He was also assessed by Dr.Wert, pulmonology. He was not found to have lung disease in spite of absence of truncation on PFTs.    He  comes today with persistent fatigue,. Also complains of his legs feeling weak. He has to sit down when he is his working in his yard due to this. He denies pain in the legs.    Past Medical History:  Diagnosis Date  . Anxiety   . ASCVD (arteriosclerotic cardiovascular disease)   . Coronary artery disease    a. 2003: s/p CABG x3V  b. 2007: cath with patent bypass grafts.  c. 09/2016: [place] with cath showing patent bypass grafts, medical Rx recommended  . Depression   . GERD (gastroesophageal reflux disease)   . High triglycerides   . Hypertension   . Laryngeal carcinoma ([place])   . Nephrolithiasis   . Peripheral vascular disease ([place])   . Stroke ([place])   . Tobacco abuse   . Tracheal stenosis     Past Surgical History:  Procedure Laterality Date  . CARDIAC SURGERY    . CAROTID STENT Right 06-13-12  . CAROTID STENT INSERTION N/A 06/13/2012   Procedure: CAROTID STENT INSERTION;  Surgeon: Serafina Mitchell, MD;  Location: [facility] CATH LAB;  Service: Cardiovascular;  Laterality: N/A;  . CHOLECYSTECTOMY  2007   Gall Bladder  . COLONOSCOPY  11/10/2011   Procedure: COLONOSCOPY;  Surgeon: Daneil Dolin, MD;  Location: [facility] ENDO SUITE;  Service: Endoscopy;  Laterality: N/A;  8:30 AM  . CORONARY ARTERY BYPASS GRAFT    . EYE SURGERY    . LEFT HEART CATH AND CORS/GRAFTS ANGIOGRAPHY N/A 10/07/2016   Procedure: Left Heart Cath and Cors/Grafts Angiography;  Surgeon:  Troy Sine, MD;  Location: [facility] CV LAB;  Service: Cardiovascular;  Laterality: N/A;  . PARTIAL LARYNGECTOMY  1998     Current Outpatient Prescriptions  Medication Sig Dispense Refill  . aspirin 81 MG tablet Take 81 mg by mouth at bedtime.     . diphenhydrAMINE (BENADRYL) 25 MG tablet Take 25 mg by mouth every 6 (six) hours as needed (for oral swelling).    . metoprolol tartrate (LOPRESSOR) 25 MG tablet Take 1 tablet (25 mg total) by mouth 2 (two) times daily. 60 tablet 5  . nitroGLYCERIN (NITROSTAT) 0.4 MG SL tablet Place 1 tablet  (0.4 mg total) under the tongue every 5 (five) minutes as needed for chest pain. 25 tablet 3  . Omega-3 Fatty Acids (OMEGA-3 FISH OIL PO) Take 1 capsule by mouth daily.    Marland Kitchen omeprazole (PRILOSEC) 20 MG capsule Take 20 mg by mouth daily.    Marland Kitchen PARoxetine (PAXIL) 20 MG tablet Take 20 mg by mouth at bedtime.      No current facility-administered medications for this visit.     Allergies:   Nifedipine; Erythromycin; Lorazepam; Penicillins; Sulfonamide derivatives; and Statins    Social History:  The patient  reports that he quit smoking about 46 years ago. His smoking use included Cigarettes. He started smoking about 63 years ago. He has a 15.00 pack-year smoking history. He quit smokeless tobacco use about 14 years ago. His smokeless tobacco use included Chew. He reports that he does not drink alcohol or use drugs.   Family History:  The patient's family history includes Cancer in his brother and brother; Dementia in his mother; Heart disease in his brother and father; Hyperlipidemia in his brother.    ROS: All other systems are reviewed and negative. Unless otherwise mentioned in H&P    PHYSICAL EXAM: VS:  BP (!) 144/80   Pulse (!) 52   Ht 5\' 6"  (1.676 m)   Wt 181 lb (82.1 kg)   SpO2 92%   BMI 29.21 kg/m  , BMI Body mass index is 29.21 kg/m. GEN: Well nourished, well developed, in no acute distress  HEENT: normal  Neck: no JVD, carotid bruits, or masses Cardiac: RRR; no murmurs, rubs, or gallops,no edema  Respiratory:  clear to auscultation bilaterally, normal work of breathing GI: soft, nontender, nondistended, + BS MS: no deformity or atrophy  1+ DP.  Skin: warm and dry, no rash Neuro:  Strength and sensation are intact Psych: euthymic mood, full affect   Recent Labs: 10/05/2016: ALT 29; B Natriuretic Peptide 52.0 10/08/2016: BUN 16; Creatinine, Ser 1.21; Hemoglobin 12.1; Platelets 217; Potassium 4.0; Sodium 141    Lipid Panel    Component Value Date/Time   CHOL 181  08/13/2011 0539   TRIG 236 (H) 08/13/2011 0539   HDL 35 (L) 08/13/2011 0539   CHOLHDL 5.2 08/13/2011 0539   VLDL 47 (H) 08/13/2011 0539   LDLCALC 99 08/13/2011 0539      Wt Readings from Last 3 Encounters:  10/25/16 181 lb (82.1 kg)  10/08/16 176 lb 9.6 oz (80.1 kg)  12/19/15 183 lb (83 kg)      ASSESSMENT AND PLAN:  1.  ASCVD: Recent cardiac cath did not reveal new areas of stenosis. Patent grafts. He has not had any chest pain. He main complaint is fatigue. He has now been placed on low dose metoprolol. This may be contributing but will wait for a while to see if he gets used to it. Continue secondary  prevention.   2. Leg fatigue: With history of PAD, will check bilateral ABI's to evaluate further.   3. Hypertension: Intolerant to multiple medications. I have added nifedipine to his allergy/intolerance list. Moderately controlled at this time. Will continue to evaluate. He is not on optimal control now, may consider spironolactone.   4. Generalized fatigue: Will check TSH, testosterone, and Mg+. Copy to Dr. Willey Blade.    Current medicines are reviewed at length with the patient today.    Labs/ tests ordered today include:   Orders Placed This Encounter  Procedures  . US Arterial Seg Multiple  . Magnesium  . TSH  . Testosterone     Disposition:   FU with cardiology in one month   Signed, Jory Sims, NP  10/25/2016 2:54 PM    [facility] [address], [place], [place] [address] Phone: [phone]; Fax: [phone]

## 2016-10-25 ENCOUNTER — Other Ambulatory Visit (HOSPITAL_COMMUNITY)
Admission: RE | Admit: 2016-10-25 | Discharge: 2016-10-25 | Disposition: A | Discharge status: Home / Self Care | Payer: Medicare HMO | Source: Ambulatory Visit | Attending: Adult Health | Admitting: Adult Health

## 2016-10-25 ENCOUNTER — Ambulatory Visit (INDEPENDENT_AMBULATORY_CARE_PROVIDER_SITE_OTHER): Payer: Medicare HMO | Admitting: Adult Health

## 2016-10-25 ENCOUNTER — Encounter: Payer: Self-pay | Admitting: Adult Health

## 2016-10-25 VITALS — BP 144/80 | HR 52 | Ht 66.0 in | Wt 181.0 lb

## 2016-10-25 DIAGNOSIS — I2510 Atherosclerotic heart disease of native coronary artery without angina pectoris: Secondary | ICD-10-CM | POA: Diagnosis not present

## 2016-10-25 DIAGNOSIS — R531 Weakness: Secondary | ICD-10-CM | POA: Insufficient documentation

## 2016-10-25 DIAGNOSIS — M79604 Pain in right leg: Secondary | ICD-10-CM

## 2016-10-25 DIAGNOSIS — M79605 Pain in left leg: Secondary | ICD-10-CM

## 2016-10-25 DIAGNOSIS — I10 Essential (primary) hypertension: Secondary | ICD-10-CM

## 2016-10-25 LAB — MAGNESIUM: Magnesium: 2.2 mg/dL (ref 1.7–2.4)

## 2016-10-25 LAB — TSH: TSH: 2.399 u[IU]/mL (ref 0.350–4.500)

## 2016-10-25 NOTE — Progress Notes (Signed)
Name: Kenneth [name]    DOB: 1940-08-20  Age: 76 y.o.  MR#: [id number]       PCP:  [name], MD      Insurance: Payor: [employer] MEDICARE / Plan: [employer] MEDICARE HMO/PPO / Product Type: *No Product type* /   CC:   No chief complaint on file.   VS Vitals:   10/25/16 1425  BP: (!) 144/80  Pulse: (!) 52  SpO2: 92%  Weight: 181 lb (82.1 kg)  Height: 5\' 6"  (1.676 m)    Weights Current Weight  10/25/16 181 lb (82.1 kg)  10/08/16 176 lb 9.6 oz (80.1 kg)  12/19/15 183 lb (83 kg)    Blood Pressure  BP Readings from Last 3 Encounters:  10/25/16 (!) 144/80  10/08/16 (!) 144/79  12/19/15 130/70     Admit date:  (Not on file) Last encounter with RMR:  Visit date not found   Allergy Erythromycin; Lorazepam; Penicillins; Sulfonamide derivatives; and Statins  Current Outpatient Prescriptions  Medication Sig Dispense Refill  . aspirin 81 MG tablet Take 81 mg by mouth at bedtime.     . diphenhydrAMINE (BENADRYL) 25 MG tablet Take 25 mg by mouth every 6 (six) hours as needed (for oral swelling).    . metoprolol tartrate (LOPRESSOR) 25 MG tablet Take 1 tablet (25 mg total) by mouth 2 (two) times daily. 60 tablet 5  . nitroGLYCERIN (NITROSTAT) 0.4 MG SL tablet Place 1 tablet (0.4 mg total) under the tongue every 5 (five) minutes as needed for chest pain. 25 tablet 3  . Omega-3 Fatty Acids (OMEGA-3 FISH OIL PO) Take 1 capsule by mouth daily.    Marland Kitchen omeprazole (PRILOSEC) 20 MG capsule Take 20 mg by mouth daily.    Marland Kitchen PARoxetine (PAXIL) 20 MG tablet Take 20 mg by mouth at bedtime.      No current facility-administered medications for this visit.     Discontinued Meds:    Medications Discontinued During This Encounter  Medication Reason  . NIFEdipine (PROCARDIA-XL/ADALAT CC) 60 MG 24 hr tablet Error  . esomeprazole (NEXIUM) 20 MG capsule Error    Patient Active Problem List   Diagnosis Date Noted  . Chest pain 10/06/2016  . Essential hypertension 10/06/2016  . 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 Dyspnea 12/05/2015  . 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 Aftercare following surgery of the circulatory system, [place] 08/03/2013  . Occlusion and stenosis of carotid artery without mention of cerebral infarction 07/21/2012  . Preop cardiovascular exam 05/08/2012  . Carotid stenosis, bilateral 05/05/2012  . CAD in native artery 08/13/2011  . 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 HLD (hyperlipidemia) 12/14/2009  . CEREBROVASCULAR ACCIDENT 12/14/2009  . GASTROESOPHAGEAL REFLUX DISEASE 12/14/2009  . NEPHROLITHIASIS 12/14/2009    LABS    Component Value Date/Time   NA 141 10/08/2016 0605   NA 136 10/05/2016 1832   NA 141 06/13/2012 0738   K 4.0 10/08/2016 0605   K 3.9 10/05/2016 1832   K 4.4 06/13/2012 0738   CL 104 10/08/2016 0605   CL 100 (L) 10/05/2016 1832   CL 104 06/13/2012 0738   CO2 28 10/08/2016 0605   CO2 28 10/05/2016 1832   CO2 27 04/27/2012 1554   GLUCOSE 98 10/08/2016 0605   GLUCOSE 78 10/05/2016 1832   GLUCOSE 102 (H) 06/13/2012 0738   BUN 16 10/08/2016 0605   BUN 17 10/05/2016 1832   BUN 18 06/13/2012 0738   CREATININE 1.21 10/08/2016 0605   CREATININE 1.36 (H)  10/05/2016 1832   CREATININE 1.10 11/12/2015 1237   CALCIUM 9.1 10/08/2016 0605   CALCIUM 9.5 10/05/2016 1832   CALCIUM 9.6 04/27/2012 1554   GFRNONAA 56 (L) 10/08/2016 0605   GFRNONAA 49 (L) 10/05/2016 1832   GFRNONAA 61 (L) 04/27/2012 1554   GFRAA >60 10/08/2016 0605   GFRAA 56 (L) 10/05/2016 1832   GFRAA 70 (L) 04/27/2012 1554   CMP     Component Value Date/Time   NA 141 10/08/2016 0605   K 4.0 10/08/2016 0605   CL 104 10/08/2016 0605   CO2 28 10/08/2016 0605   GLUCOSE 98 10/08/2016 0605   BUN 16 10/08/2016 0605   CREATININE 1.21 10/08/2016 0605   CALCIUM 9.1 10/08/2016 0605   PROT 7.7 10/05/2016 1832   ALBUMIN 4.2 10/05/2016 1832   AST 24 10/05/2016 1832   ALT 29 10/05/2016 1832   ALKPHOS 118 10/05/2016 1832   BILITOT 0.4 10/05/2016 1832   GFRNONAA 56 (L) 10/08/2016 0605    GFRAA >60 10/08/2016 0605       Component Value Date/Time   WBC 7.9 10/08/2016 0605   WBC 10.1 10/05/2016 1832   WBC 6.3 04/27/2012 1554   HGB 12.1 (L) 10/08/2016 0605   HGB 14.4 10/05/2016 1832   HGB 15.0 06/13/2012 0738   HCT 37.5 (L) 10/08/2016 0605   HCT 42.1 10/05/2016 1832   HCT 44.0 06/13/2012 0738   MCV 95.9 10/08/2016 0605   MCV 93.6 10/05/2016 1832   MCV 95.4 04/27/2012 1554    Lipid Panel     Component Value Date/Time   CHOL 181 08/13/2011 0539   TRIG 236 (H) 08/13/2011 0539   HDL 35 (L) 08/13/2011 0539   CHOLHDL 5.2 08/13/2011 0539   VLDL 47 (H) 08/13/2011 0539   LDLCALC 99 08/13/2011 0539    ABG    Component Value Date/Time   TCO2 26 06/13/2012 0738     Lab Results  Component Value Date   TSH 2.601 06/01/2010   BNP (last 3 results)  Recent Labs  10/05/16 1832  BNP 52.0    ProBNP (last 3 results) No results for input(s): PROBNP in the last 8760 hours.  Cardiac Panel (last 3 results) No results for input(s): CKTOTAL, CKMB, TROPONINI, RELINDX in the last 72 hours.  Iron/TIBC/Ferritin/ %Sat No results found for: IRON, TIBC, FERRITIN, IRONPCTSAT   EKG Orders placed or performed during the hospital encounter of 10/05/16  . EKG 12-Lead  . EKG 12-Lead  . ED EKG  . ED EKG  . EKG 12-Lead  . EKG 12-Lead  . EKG     Prior Assessment and Plan Problem List as of 10/25/2016 Reviewed: 12/19/2015  3:19 PM by Jory Sims, NP     Cardiovascular and Mediastinum   CEREBROVASCULAR ACCIDENT   CAD in native artery   Last Assessment & Plan 08/19/2014 Office Visit Written 08/19/2014  2:17 PM by Lendon Colonel, NP    He is stable and asymptomatic from a  CV standpoint, He is medically compliant. Will continue current medication regimen and see him annually.  Of course, will see him sooner if he is having problems.      Carotid stenosis, bilateral   Last Assessment & Plan 08/19/2014 Office Visit Written 08/19/2014  2:19 PM by Lendon Colonel, NP     He is 3 years post stent placement. He has now stopped Plavix due to new Rx for Nexium by Dr. Willey Blade.       Occlusion and stenosis of  carotid artery without mention of cerebral infarction   Essential hypertension     Respiratory   Malignant neoplasm of larynx (HCC)   Glottic stenosis   Tracheal stenosis     Digestive   GASTROESOPHAGEAL REFLUX DISEASE     Genitourinary   NEPHROLITHIASIS     Other   HLD (hyperlipidemia)   Last Assessment & Plan 08/19/2014 Office Visit Written 08/19/2014  2:18 PM by Lendon Colonel, NP    Labs are followed by Dr. Willey Blade. He does not tolerate statin's due to rash. He is advised on a low cholesterol diet.       Preop cardiovascular exam   Last Assessment & Plan 05/08/2012 Office Visit Written 05/08/2012 11:48 AM by Evans Lance, MD    The patient's surgical risk for pending carotid endarterectomy is low from a cardiovascular perspective. His prior radiation therapy and prior laryngectomy may make the technical aspects of carotid endarterectomy more difficult. He is allowed to proceed with surgery as scheduled. We will be available if perioperative cardiovascular care is necessary.      Aftercare following surgery of the circulatory system, [facility]   Dyspnea   Last Assessment & Plan 12/05/2015 Office Visit Edited 12/05/2015 11:22 AM by Tanda Rockers, MD    PFT's  09/24/15   FEV1 2.08(82 % ) ratio 81  p 10 % improvement from saba p no rx prior to study with DLCO  58 % correct to 61 % for alv volume   - 12/05/2015  Walked RA x 3 laps @ 185 ft each stopped due to  End of study, nl pace, no desat,  Mild sob - Spirometry 12/05/2015  FEV1 1.78 (64%)  Ratio 74  s truncation  So there is no evidence of a lung problem here and despite absence of truncation on pfts his problem appears to be entirely upper airway in terms of symptoms and should try max gerd rx pending f/u with Dr [name] for possible VC surgery   I had an extended discussion with the patient and son  reviewing all relevant studies completed to date and  lasting 35 minutes of a 60 minute visit    Each maintenance medication was reviewed in detail including most importantly the difference between maintenance and prns and under what circumstances the prns are to be triggered using an action plan format that is not reflected in the computer generated alphabetically organized AVS.    Please see instructions for details which were reviewed in writing and the patient given a copy highlighting the part that I personally wrote and discussed at today's ov.       Chest pain       Imaging: Dg Chest 2 View  Result Date: 10/05/2016 CLINICAL DATA:  Shortness of Breath EXAM: CHEST  2 VIEW COMPARISON:  12/05/2015 FINDINGS: Postsurgical changes are again seen. Cardiac shadow is stable. The lungs are well aerated with mild chronic scarring. No acute infiltrate or sizable effusion is noted. No bony abnormality is seen. IMPRESSION: No active cardiopulmonary disease. Electronically Signed   By: Inez Catalina M.D.   On: 10/05/2016 18:19   Ct Angio Chest Pe W And/or Wo Contrast  Result Date: 10/05/2016 CLINICAL DATA:  Shortness of breath on exertion. Global weakness and fatigue. Elevated D-dimer. EXAM: CT ANGIOGRAPHY CHEST WITH CONTRAST TECHNIQUE: Multidetector CT imaging of the chest was performed using the standard protocol during bolus administration of intravenous contrast. Multiplanar CT image reconstructions and MIPs were obtained to evaluate the vascular  anatomy. CONTRAST:  100 mL Isovue 370 COMPARISON:  None. FINDINGS: Cardiovascular: Satisfactory opacification of the pulmonary arteries to the segmental level. No evidence of pulmonary embolism. Normal heart size. No pericardial effusion. Postoperative changes in the mediastinum consistent with coronary artery bypass grafts. Normal caliber thoracic aorta with scattered calcification. No aneurysm or dissection. Mediastinum/Nodes: No enlarged mediastinal, hilar,  or axillary lymph nodes. Thyroid gland, trachea, and esophagus demonstrate no significant findings. Lungs/Pleura: Focal linear scarring in the right middle lobe and left lingula. No consolidation or airspace disease. No pleural effusions. No pneumothorax. Airways are patent. Upper Abdomen: Surgical absence of the gallbladder. Musculoskeletal: Degenerative changes in the spine. No destructive bone lesions. Review of the MIP images confirms the above findings. IMPRESSION: No evidence of significant pulmonary embolus. Scarring in the lungs without evidence of active pulmonary disease. Electronically Signed   By: Lucienne Capers M.D.   On: 10/05/2016 23:11

## 2016-10-25 NOTE — Patient Instructions (Signed)
Your physician recommends that you schedule a follow-up appointment in:  1 Month   Your physician recommends that you continue on your current medications as directed. Please refer to the Current Medication list given to you today.  Your physician has requested that you have an ankle brachial index (ABI). During this test an ultrasound and blood pressure cuff are used to evaluate the arteries that supply the arms and legs with blood. Allow thirty minutes for this exam. There are no restrictions or special instructions.  If you need a refill on your cardiac medications before your next appointment, please call your pharmacy.  Thank you for choosing [facility]!    

## 2016-10-26 LAB — TESTOSTERONE: Testosterone: 294 ng/dL (ref 264–916)

## 2016-10-29 ENCOUNTER — Ambulatory Visit (HOSPITAL_COMMUNITY)
Admission: RE | Admit: 2016-10-29 | Discharge: 2016-10-29 | Disposition: A | Discharge status: Home / Self Care | Payer: Medicare HMO | Source: Ambulatory Visit | Attending: Adult Health | Admitting: Adult Health

## 2016-10-29 DIAGNOSIS — M79604 Pain in right leg: Secondary | ICD-10-CM | POA: Diagnosis not present

## 2016-10-29 DIAGNOSIS — I10 Essential (primary) hypertension: Secondary | ICD-10-CM | POA: Diagnosis not present

## 2016-10-29 DIAGNOSIS — M79605 Pain in left leg: Secondary | ICD-10-CM | POA: Insufficient documentation

## 2016-11-02 ENCOUNTER — Ambulatory Visit: Payer: Self-pay | Admitting: Allergy & Immunology

## 2016-11-28 NOTE — Progress Notes (Signed)
Cardiology Office Note   Date:  11/29/2016   ID:  Kenneth [name], DOB 18-May-1941, MRN [id number]  PCP:  Kenneth Noble, MD  Cardiologist:  [facility]  Chief Complaint  Patient presents with  . Fatigue     History of Present Illness: Kenneth [name] is a 76 y.o. male who presents for ongoing assessment and management of CAD, S/P CABG in 2003 with a sequential LIMA to the diagonal and LAD and RIMA to the obtuse marginal branch of the circumflex vessel. Repeat cath 2018 with patent grafts, low risk nuclear stress test 2013 and 2015 without ischemia. Was last seen in the office on 10/25/2016 with complaints of fatigue. He was continued on medication regimen. Labs were ordered and found to be WNL.  ABI's were checked and found to be normal.   He comes today without complaints. He has not been adhering to low sodium diet. He is medically compliant.   Past Medical History:  Diagnosis Date  . Anxiety   . ASCVD (arteriosclerotic cardiovascular disease)   . Coronary artery disease    a. 2003: s/p CABG x3V  b. 2007: cath with patent bypass grafts.  c. 09/2016: [place] with cath showing patent bypass grafts, medical Rx recommended  . Depression   . GERD (gastroesophageal reflux disease)   . High triglycerides   . Hypertension   . Laryngeal carcinoma ([place])   . Nephrolithiasis   . Peripheral vascular disease ([place])   . Stroke ([place])   . Tobacco abuse   . Tracheal stenosis     Past Surgical History:  Procedure Laterality Date  . CARDIAC SURGERY    . CAROTID STENT Right 06-13-12  . CAROTID STENT INSERTION N/A 06/13/2012   Procedure: CAROTID STENT INSERTION;  Surgeon: Serafina Mitchell, MD;  [facility] CATH LAB;  Service: Cardiovascular;  Laterality: N/A;  . CHOLECYSTECTOMY  2007   Gall Bladder  . COLONOSCOPY  11/10/2011   Procedure: COLONOSCOPY;  Surgeon: Daneil Dolin, MD;  Location: [facility] ENDO SUITE;  Service: Endoscopy;  Laterality: N/A;  8:30 AM  . CORONARY ARTERY BYPASS GRAFT    . EYE SURGERY    .  LEFT HEART CATH AND CORS/GRAFTS ANGIOGRAPHY N/A 10/07/2016   Procedure: Left Heart Cath and Cors/Grafts Angiography;  Surgeon: Troy Sine, MD;  Location: [facility] CV LAB;  Service: Cardiovascular;  Laterality: N/A;  . PARTIAL LARYNGECTOMY  1998     Current Outpatient Prescriptions  Medication Sig Dispense Refill  . aspirin 81 MG tablet Take 81 mg by mouth at bedtime.     . diphenhydrAMINE (BENADRYL) 25 MG tablet Take 25 mg by mouth every 6 (six) hours as needed (for oral swelling).    . metoprolol tartrate (LOPRESSOR) 25 MG tablet Take 1 tablet (25 mg total) by mouth 2 (two) times daily. 60 tablet 5  . nitroGLYCERIN (NITROSTAT) 0.4 MG SL tablet Place 1 tablet (0.4 mg total) under the tongue every 5 (five) minutes as needed for chest pain. 25 tablet 3  . Omega-3 Fatty Acids (OMEGA-3 FISH OIL PO) Take 1 capsule by mouth daily.    Marland Kitchen omeprazole (PRILOSEC) 20 MG capsule Take 20 mg by mouth daily.    Marland Kitchen PARoxetine (PAXIL) 20 MG tablet Take 20 mg by mouth at bedtime.      No current facility-administered medications for this visit.     Allergies:   Nifedipine; Erythromycin; Lorazepam; Penicillins; Sulfonamide derivatives; and Statins    Social History:  The patient  reports that he  quit smoking about 46 years ago. His smoking use included Cigarettes. He started smoking about 63 years ago. He has a 15.00 pack-year smoking history. He quit smokeless tobacco use about 14 years ago. His smokeless tobacco use included Chew. He reports that he does not drink alcohol or use drugs.   Family History:  The patient's family history includes Cancer in his brother and brother; Dementia in his mother; Heart disease in his brother and father; Hyperlipidemia in his brother.    ROS: All other systems are reviewed and negative. Unless otherwise mentioned in H&P    PHYSICAL EXAM: VS:  Ht 5\' 6"  (1.676 m)   Wt 182 lb (82.6 kg)   BMI 29.38 kg/m  , BMI Body mass index is 29.38 kg/m. GEN: Well nourished,  well developed, in no acute distress  HEENT: normal Voice is raspy. Very HOH Neck: no JVD, carotid bruits, or masses Cardiac: RRR; no murmurs, rubs, or gallops,no edema  Respiratory:  clear to auscultation bilaterally, normal work of breathing GI: soft, nontender, nondistended, + BS MS: no deformity or atrophy  Skin: warm and dry, no rash Neuro:  Strength and sensation are intact Psych: euthymic mood, full affect   Recent Labs: 10/05/2016: ALT 29; B Natriuretic Peptide 52.0 10/08/2016: BUN 16; Creatinine, Ser 1.21; Hemoglobin 12.1; Platelets 217; Potassium 4.0; Sodium 141 10/25/2016: Magnesium 2.2; TSH 2.399    Lipid Panel    Component Value Date/Time   CHOL 181 08/13/2011 0539   TRIG 236 (H) 08/13/2011 0539   HDL 35 (L) 08/13/2011 0539   CHOLHDL 5.2 08/13/2011 0539   VLDL 47 (H) 08/13/2011 0539   LDLCALC 99 08/13/2011 0539      Wt Readings from Last 3 Encounters:  11/29/16 182 lb (82.6 kg)  10/25/16 181 lb (82.1 kg)  10/08/16 176 lb 9.6 oz (80.1 kg)      Other studies Reviewed: Echocardiogram December 25, 2015 - Left ventricle: The cavity size was normal. Wall thickness was   increased in a pattern of mild LVH. Systolic function was normal.   The estimated ejection fraction was in the range of 60% to 65%.   Wall motion was normal; there were no regional wall motion   abnormalities. Left ventricular diastolic function parameters   were normal. - Aortic valve: Mildly calcified annulus. Mildly thickened   leaflets. There was mild regurgitation. Valve area (VTI): 2.32   cm^2. Valve area (Vmax): 1.98 cm^2. - Left atrium: The atrium was mildly dilated. - Technically adequate study.  ASSESSMENT AND PLAN:  1. Hypertension: I have rechecked this in the exam room. 160/88.Marland Kitchen He has been eating a lot of fast food and has had french fries and chicken nuggets just prior to coming today. I have advise him on a low sodium diet. I will start amlodipine 2.5 mg daily. He will keep a BP journal.  Come back in 4 days to have BP check.   2. CAD: Know history of CABG. Last cath in 2018 with patent grafts. Continue medical management. DAPT.   3. Generalized fatigue: Unchanged. Will have PCP follow up with this.    Current medicines are reviewed at length with the patient today.    Labs/ tests ordered today include:  No orders of the defined types were placed in this encounter.    Disposition:   FU with BP check with nurse in 4 days                       FU  appointment 3 months.   Signed, Jory Sims, NP  11/29/2016 1:01 PM    [facility] [address], [place], [place] [address] Phone: [phone]; Fax: [phone]

## 2016-11-29 ENCOUNTER — Encounter: Payer: Self-pay | Admitting: Adult Health

## 2016-11-29 ENCOUNTER — Ambulatory Visit (INDEPENDENT_AMBULATORY_CARE_PROVIDER_SITE_OTHER): Payer: Medicare HMO | Admitting: Adult Health

## 2016-11-29 VITALS — BP 180/90 | HR 48 | Ht 66.0 in | Wt 182.0 lb

## 2016-11-29 DIAGNOSIS — I10 Essential (primary) hypertension: Secondary | ICD-10-CM | POA: Diagnosis not present

## 2016-11-29 DIAGNOSIS — I2510 Atherosclerotic heart disease of native coronary artery without angina pectoris: Secondary | ICD-10-CM | POA: Diagnosis not present

## 2016-11-29 MED ORDER — AMLODIPINE BESYLATE 2.5 MG PO TABS: 2.5000 mg | ORAL_TABLET | Freq: Every day | ORAL | 3 refills | Status: DC

## 2016-11-29 NOTE — Patient Instructions (Addendum)
Medication Instructions: START Amlodipine 2.5 mg daily   Follow low sodium diet I have given you  Labwork: None  Procedures/Testing: None  Follow-Up: This Friday, 12/03/16 with nurse for BP check, bring BP log sheet with you    3 months with Arnold Long NP  Any Additional Special Instructions Will Be Listed Below (If Applicable).  NONE     If you need a refill on your cardiac medications before your next appointment, please call your pharmacy.

## 2016-12-03 ENCOUNTER — Ambulatory Visit: Payer: Medicare HMO

## 2017-01-31 ENCOUNTER — Encounter: Payer: Self-pay | Admitting: Adult Health

## 2017-01-31 ENCOUNTER — Ambulatory Visit (INDEPENDENT_AMBULATORY_CARE_PROVIDER_SITE_OTHER): Payer: Medicare HMO | Admitting: Adult Health

## 2017-01-31 VITALS — BP 142/72 | HR 76 | Ht 66.0 in | Wt 178.0 lb

## 2017-01-31 DIAGNOSIS — I10 Essential (primary) hypertension: Secondary | ICD-10-CM | POA: Diagnosis not present

## 2017-01-31 DIAGNOSIS — I2510 Atherosclerotic heart disease of native coronary artery without angina pectoris: Secondary | ICD-10-CM

## 2017-01-31 MED ORDER — METOPROLOL TARTRATE 25 MG PO TABS
25.0000 mg | ORAL_TABLET | Freq: Two times a day (BID) | ORAL | 3 refills | Status: DC
Start: 2017-01-31 — End: 2017-08-26

## 2017-01-31 MED ORDER — NITROGLYCERIN 0.4 MG SL SUBL: 0.4000 mg | SUBLINGUAL_TABLET | SUBLINGUAL | 3 refills | Status: DC | PRN

## 2017-01-31 MED ORDER — AMLODIPINE BESYLATE 2.5 MG PO TABS: 2.5000 mg | ORAL_TABLET | Freq: Every day | ORAL | 3 refills | Status: DC

## 2017-01-31 NOTE — Progress Notes (Signed)
Cardiology Office Note   Date:  01/31/2017   ID:  Kenneth [name], DOB 21-Apr-1941, MRN [id number]  PCP:  [name], MD  Cardiologist:  [facility]  Chief Complaint  Patient presents with  . Coronary Artery Disease  . Hypertension      History of Present Illness: Kenneth [name] is a 76 y.o. male who presents for ongoing assessment and management of coronary artery disease, status post CABG in 2003 (LIMA to diagonal and LAD, RIMA to OM of circumflex). Most recent catheterization in 2018 with patent grafts, the patient was last seen in the office on 11/29/2016 and was medically compliant and without complaint  He was however, hypertensive. He been eating a lot of fast food, heavily salted foods. He was advised a low sodium diet and started on amlodipine 2.5 mg daily. He was to keep a blood pressure drill and return to the office with this for review of blood pressure control and response to medication. He was continued on secondary prevention.He is here today feeling very well. With changes in his medication he is not feeling as sluggish he is remaining active, is avoiding fast food, and has actually lost 5 pounds since being seen last.  Past Medical History:  Diagnosis Date  . Anxiety   . ASCVD (arteriosclerotic cardiovascular disease)   . Coronary artery disease    a. 2003: s/p CABG x3V  b. 2007: cath with patent bypass grafts.  c. 09/2016: [place] with cath showing patent bypass grafts, medical Rx recommended  . Depression   . GERD (gastroesophageal reflux disease)   . High triglycerides   . Hypertension   . 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 Nephrolithiasis   . Peripheral vascular disease ([place])   . Stroke ([place])   . Tobacco abuse   . Tracheal stenosis     Past Surgical History:  Procedure Laterality Date  . CARDIAC SURGERY    . CAROTID STENT Right 06-13-12  . CAROTID STENT INSERTION N/A 06/13/2012   Procedure: CAROTID STENT INSERTION;  Surgeon: Serafina Mitchell, MD;  Location: [facility] CATH  LAB;  Service: Cardiovascular;  Laterality: N/A;  . CHOLECYSTECTOMY  2007   Gall Bladder  . COLONOSCOPY  11/10/2011   Procedure: COLONOSCOPY;  Surgeon: Daneil Dolin, MD;  Location: [facility] ENDO SUITE;  Service: Endoscopy;  Laterality: N/A;  8:30 AM  . CORONARY ARTERY BYPASS GRAFT    . EYE SURGERY    . LEFT HEART CATH AND CORS/GRAFTS ANGIOGRAPHY N/A 10/07/2016   Procedure: Left Heart Cath and Cors/Grafts Angiography;  Surgeon: Troy Sine, MD;  Location: [place] CV LAB;  Service: Cardiovascular;  Laterality: N/A;  . PARTIAL LARYNGECTOMY  1998     Current Outpatient Prescriptions  Medication Sig Dispense Refill  . amLODipine (NORVASC) 2.5 MG tablet Take 1 tablet (2.5 mg total) by mouth daily. 90 tablet 3  . aspirin 81 MG tablet Take 81 mg by mouth at bedtime.     . diphenhydrAMINE (BENADRYL) 25 MG tablet Take 25 mg by mouth every 6 (six) hours as needed (for oral swelling).    . metoprolol tartrate (LOPRESSOR) 25 MG tablet Take 1 tablet (25 mg total) by mouth 2 (two) times daily. 60 tablet 5  . nitroGLYCERIN (NITROSTAT) 0.4 MG SL tablet Place 1 tablet (0.4 mg total) under the tongue every 5 (five) minutes as needed for chest pain. 25 tablet 3  . Omega-3 Fatty Acids (OMEGA-3 FISH OIL PO) Take 1 capsule by mouth daily.    Marland Kitchen  omeprazole (PRILOSEC) 20 MG capsule Take 20 mg by mouth daily.    Marland Kitchen PARoxetine (PAXIL) 20 MG tablet Take 20 mg by mouth at bedtime.      No current facility-administered medications for this visit.     Allergies:   Nifedipine; Erythromycin; Lorazepam; Penicillins; Sulfonamide derivatives; and Statins    Social History:  The patient  reports that he quit smoking about 46 years ago. His smoking use included Cigarettes. He started smoking about 63 years ago. He has a 15.00 pack-year smoking history. He quit smokeless tobacco use about 14 years ago. His smokeless tobacco use included Chew. He reports that he does not drink alcohol or use drugs.   Family History:  The  patient's family history includes Cancer in his brother and brother; Dementia in his mother; Heart disease in his brother and father; Hyperlipidemia in his brother.    ROS: All other systems are reviewed and negative. Unless otherwise mentioned in H&P    PHYSICAL EXAM: VS:  Pulse 76   Ht 5\' 6"  (1.676 m)   Wt 178 lb (80.7 kg)   SpO2 98%   BMI 28.73 kg/m  , BMI Body mass index is 28.73 kg/m. GEN: Well nourished, well developed, in no acute distress  HEENT: normal voice is raspy Neck: no JVD, carotid bruits, or masses Cardiac: RRR; no murmurs, rubs, or gallops,no edema  Respiratory:  clear to auscultation bilaterally, normal work of breathing GI: soft, nontender, nondistended, + BS MS: no deformity or atrophy  Skin: warm and dry, no rash Neuro:  Strength and sensation are intact Psych: euthymic mood, full affect    Recent Labs: 10/05/2016: ALT 29; B Natriuretic Peptide 52.0 10/08/2016: BUN 16; Creatinine, Ser 1.21; Hemoglobin 12.1; Platelets 217; Potassium 4.0; Sodium 141 10/25/2016: Magnesium 2.2; TSH 2.399    Lipid Panel    Component Value Date/Time   CHOL 181 08/13/2011 0539   TRIG 236 (H) 08/13/2011 0539   HDL 35 (L) 08/13/2011 0539   CHOLHDL 5.2 08/13/2011 0539   VLDL 47 (H) 08/13/2011 0539   LDLCALC 99 08/13/2011 0539      Wt Readings from Last 3 Encounters:  01/31/17 178 lb (80.7 kg)  11/29/16 182 lb (82.6 kg)  10/25/16 181 lb (82.1 kg)      Other studies Reviewed: Conclusion     LM lesion, 50 %stenosed.  Prox LAD to Mid LAD lesion, 70 %stenosed.  Ost 1st Mrg lesion, 100 %stenosed.  RIMA and is normal in caliber and anatomically normal.  RPDA-2 lesion, 50 %stenosed.  RPDA-1 lesion, 60 %stenosed.  LIMA.  The left ventricular systolic function is normal.  LV end diastolic pressure is normal.   Normal LV function without focal segmental wall motion abnormalities.  Multivessel  native CAD with 50% ostial stenosis of the LAD and diffuse 70%  mid stenoses, occlusion of the OM1 vessel of the circumflex, and diffuse 60 and 50% PDA stenoses in the small caliber PDA vessel.  Patent sequential LIMA graft supplying a twin like diagonal and LAD system system.  Patent RIMA graft supplying the circumflex marginal vessel.     ASSESSMENT AND PLAN:  1.  Coronary artery disease: He is doing very well. No cardiac complaints today. In fact he is feeling more energetic with blood pressure medication adjustments. We'll give him 90 day refills on all of his cardiac medications. I've also asked him to refill nitroglycerin sublingual each time he feels his 90 day prescriptions to keep his current dose fresh and potent  to use if necessary. Continue secondary prevention.  2. Hypertension: Much better controlled on medication adjustments. I have reviewed his blood pressure recording sheet. Blood pressure has trended down significantly. It is averaging 1:30 62/13/0865 systolically over 78-46. There are some periods when it is elevated especially in the late evening hours around 10 PM. Otherwise left pressure has trended down nicely.     Current medicines are reviewed at length with the patient today.    Labs/ tests ordered today include: none Phill Myron. [place] Pugh, ANP, AACC   01/31/2017 12:55 PM    [facility] [address], [place], [place] [address] Phone: [phone]; Fax: [phone]

## 2017-01-31 NOTE — Patient Instructions (Signed)
Medication Instructions:  Your physician recommends that you continue on your current medications as directed. Please refer to the Current Medication list given to you today.   Labwork: NONE  Testing/Procedures: NONE  Follow-Up: Your physician wants you to follow-up in: 6 MONTHS  You will receive a reminder letter in the mail two months in advance. If you don't receive a letter, please call our office to schedule the follow-up appointment. 3  Any Other Special Instructions Will Be Listed Below (If Applicable).  I HAVE REFILLED YOUR MEDICATIONS.    If you need a refill on your cardiac medications before your next appointment, please call your pharmacy.

## 2017-02-04 DIAGNOSIS — I739 Peripheral vascular disease, unspecified: Secondary | ICD-10-CM | POA: Diagnosis not present

## 2017-02-04 DIAGNOSIS — I2510 Atherosclerotic heart disease of native coronary artery without angina pectoris: Secondary | ICD-10-CM | POA: Diagnosis not present

## 2017-02-04 DIAGNOSIS — H9113 Presbycusis, bilateral: Secondary | ICD-10-CM | POA: Diagnosis not present

## 2017-02-04 DIAGNOSIS — Z0000 Encounter for general adult medical examination without abnormal findings: Secondary | ICD-10-CM | POA: Diagnosis not present

## 2017-02-04 DIAGNOSIS — E669 Obesity, unspecified: Secondary | ICD-10-CM | POA: Diagnosis not present

## 2017-02-04 DIAGNOSIS — K219 Gastro-esophageal reflux disease without esophagitis: Secondary | ICD-10-CM | POA: Diagnosis not present

## 2017-02-04 DIAGNOSIS — Z6830 Body mass index (BMI) 30.0-30.9, adult: Secondary | ICD-10-CM | POA: Diagnosis not present

## 2017-02-04 DIAGNOSIS — I10 Essential (primary) hypertension: Secondary | ICD-10-CM | POA: Diagnosis not present

## 2017-02-04 DIAGNOSIS — R69 Illness, unspecified: Secondary | ICD-10-CM | POA: Diagnosis not present

## 2017-02-04 DIAGNOSIS — Z7901 Long term (current) use of anticoagulants: Secondary | ICD-10-CM | POA: Diagnosis not present

## 2017-03-10 DIAGNOSIS — H35033 Hypertensive retinopathy, bilateral: Secondary | ICD-10-CM | POA: Diagnosis not present

## 2017-03-10 DIAGNOSIS — H524 Presbyopia: Secondary | ICD-10-CM | POA: Diagnosis not present

## 2017-03-22 DIAGNOSIS — I10 Essential (primary) hypertension: Secondary | ICD-10-CM | POA: Diagnosis not present

## 2017-03-22 DIAGNOSIS — I2510 Atherosclerotic heart disease of native coronary artery without angina pectoris: Secondary | ICD-10-CM | POA: Diagnosis not present

## 2017-03-30 DIAGNOSIS — H4912 Fourth [trochlear] nerve palsy, left eye: Secondary | ICD-10-CM | POA: Diagnosis not present

## 2017-04-06 DIAGNOSIS — R0600 Dyspnea, unspecified: Secondary | ICD-10-CM | POA: Diagnosis not present

## 2017-04-06 DIAGNOSIS — Z8521 Personal history of malignant neoplasm of larynx: Secondary | ICD-10-CM | POA: Diagnosis not present

## 2017-04-06 DIAGNOSIS — J386 Stenosis of larynx: Secondary | ICD-10-CM | POA: Diagnosis not present

## 2017-04-06 DIAGNOSIS — J383 Other diseases of vocal cords: Secondary | ICD-10-CM | POA: Diagnosis not present

## 2017-04-06 DIAGNOSIS — J398 Other specified diseases of upper respiratory tract: Secondary | ICD-10-CM | POA: Diagnosis not present

## 2017-04-06 DIAGNOSIS — R490 Dysphonia: Secondary | ICD-10-CM | POA: Diagnosis not present

## 2017-04-06 DIAGNOSIS — R1312 Dysphagia, oropharyngeal phase: Secondary | ICD-10-CM | POA: Diagnosis not present

## 2017-04-08 DIAGNOSIS — H4911 Fourth [trochlear] nerve palsy, right eye: Secondary | ICD-10-CM | POA: Diagnosis not present

## 2017-04-13 DIAGNOSIS — L570 Actinic keratosis: Secondary | ICD-10-CM | POA: Diagnosis not present

## 2017-04-13 DIAGNOSIS — I10 Essential (primary) hypertension: Secondary | ICD-10-CM | POA: Diagnosis not present

## 2017-05-11 DIAGNOSIS — I2510 Atherosclerotic heart disease of native coronary artery without angina pectoris: Secondary | ICD-10-CM | POA: Diagnosis not present

## 2017-05-11 DIAGNOSIS — I10 Essential (primary) hypertension: Secondary | ICD-10-CM | POA: Diagnosis not present

## 2017-05-11 DIAGNOSIS — Z79899 Other long term (current) drug therapy: Secondary | ICD-10-CM | POA: Diagnosis not present

## 2017-05-24 DIAGNOSIS — I2510 Atherosclerotic heart disease of native coronary artery without angina pectoris: Secondary | ICD-10-CM | POA: Diagnosis not present

## 2017-05-24 DIAGNOSIS — Z23 Encounter for immunization: Secondary | ICD-10-CM | POA: Diagnosis not present

## 2017-05-24 DIAGNOSIS — I10 Essential (primary) hypertension: Secondary | ICD-10-CM | POA: Diagnosis not present

## 2017-06-08 ENCOUNTER — Emergency Department (HOSPITAL_COMMUNITY): Payer: Medicare HMO

## 2017-06-08 ENCOUNTER — Other Ambulatory Visit: Payer: Self-pay

## 2017-06-08 ENCOUNTER — Emergency Department (HOSPITAL_COMMUNITY)
Admission: EM | Admit: 2017-06-08 | Discharge: 2017-06-08 | Disposition: A | Discharge status: Home / Self Care | Payer: Medicare HMO | Attending: Emergency Medicine | Admitting: Emergency Medicine

## 2017-06-08 ENCOUNTER — Encounter (HOSPITAL_COMMUNITY): Payer: Self-pay | Admitting: *Deleted

## 2017-06-08 DIAGNOSIS — W0110XA Fall on same level from slipping, tripping and stumbling with subsequent striking against unspecified object, initial encounter: Secondary | ICD-10-CM | POA: Insufficient documentation

## 2017-06-08 DIAGNOSIS — Z8673 Personal history of transient ischemic attack (TIA), and cerebral infarction without residual deficits: Secondary | ICD-10-CM | POA: Diagnosis not present

## 2017-06-08 DIAGNOSIS — Z87891 Personal history of nicotine dependence: Secondary | ICD-10-CM | POA: Insufficient documentation

## 2017-06-08 DIAGNOSIS — Y9389 Activity, other specified: Secondary | ICD-10-CM | POA: Insufficient documentation

## 2017-06-08 DIAGNOSIS — Z7982 Long term (current) use of aspirin: Secondary | ICD-10-CM | POA: Diagnosis not present

## 2017-06-08 DIAGNOSIS — I2510 Atherosclerotic heart disease of native coronary artery without angina pectoris: Secondary | ICD-10-CM | POA: Insufficient documentation

## 2017-06-08 DIAGNOSIS — S3991XA Unspecified injury of abdomen, initial encounter: Secondary | ICD-10-CM | POA: Diagnosis not present

## 2017-06-08 DIAGNOSIS — Z79899 Other long term (current) drug therapy: Secondary | ICD-10-CM | POA: Diagnosis not present

## 2017-06-08 DIAGNOSIS — T148XXA Other injury of unspecified body region, initial encounter: Secondary | ICD-10-CM | POA: Diagnosis not present

## 2017-06-08 DIAGNOSIS — I10 Essential (primary) hypertension: Secondary | ICD-10-CM | POA: Diagnosis not present

## 2017-06-08 DIAGNOSIS — S0990XA Unspecified injury of head, initial encounter: Secondary | ICD-10-CM | POA: Insufficient documentation

## 2017-06-08 DIAGNOSIS — M545 Low back pain: Secondary | ICD-10-CM | POA: Insufficient documentation

## 2017-06-08 DIAGNOSIS — R69 Illness, unspecified: Secondary | ICD-10-CM | POA: Diagnosis not present

## 2017-06-08 DIAGNOSIS — Y998 Other external cause status: Secondary | ICD-10-CM | POA: Insufficient documentation

## 2017-06-08 DIAGNOSIS — S0083XA Contusion of other part of head, initial encounter: Secondary | ICD-10-CM | POA: Diagnosis not present

## 2017-06-08 DIAGNOSIS — Z951 Presence of aortocoronary bypass graft: Secondary | ICD-10-CM | POA: Diagnosis not present

## 2017-06-08 DIAGNOSIS — F419 Anxiety disorder, unspecified: Secondary | ICD-10-CM | POA: Diagnosis not present

## 2017-06-08 DIAGNOSIS — Y929 Unspecified place or not applicable: Secondary | ICD-10-CM | POA: Insufficient documentation

## 2017-06-08 DIAGNOSIS — R079 Chest pain, unspecified: Secondary | ICD-10-CM | POA: Diagnosis not present

## 2017-06-08 DIAGNOSIS — R109 Unspecified abdominal pain: Secondary | ICD-10-CM | POA: Diagnosis not present

## 2017-06-08 DIAGNOSIS — W19XXXA Unspecified fall, initial encounter: Secondary | ICD-10-CM

## 2017-06-08 DIAGNOSIS — Z9049 Acquired absence of other specified parts of digestive tract: Secondary | ICD-10-CM | POA: Diagnosis not present

## 2017-06-08 DIAGNOSIS — M5489 Other dorsalgia: Secondary | ICD-10-CM | POA: Diagnosis not present

## 2017-06-08 DIAGNOSIS — M546 Pain in thoracic spine: Secondary | ICD-10-CM | POA: Diagnosis not present

## 2017-06-08 DIAGNOSIS — S199XXA Unspecified injury of neck, initial encounter: Secondary | ICD-10-CM | POA: Diagnosis not present

## 2017-06-08 DIAGNOSIS — F329 Major depressive disorder, single episode, unspecified: Secondary | ICD-10-CM | POA: Diagnosis not present

## 2017-06-08 LAB — I-STAT CHEM 8, ED
BUN: 29 mg/dL — ABNORMAL HIGH (ref 6–20)
Calcium, Ion: 1.12 mmol/L — ABNORMAL LOW (ref 1.15–1.40)
Chloride: 103 mmol/L (ref 101–111)
Creatinine, Ser: 1.3 mg/dL — ABNORMAL HIGH (ref 0.61–1.24)
Glucose, Bld: 116 mg/dL — ABNORMAL HIGH (ref 65–99)
HCT: 42 % (ref 39.0–52.0)
Hemoglobin: 14.3 g/dL (ref 13.0–17.0)
Potassium: 4.8 mmol/L (ref 3.5–5.1)
Sodium: 136 mmol/L (ref 135–145)
TCO2: 27 mmol/L (ref 22–32)

## 2017-06-08 MED ORDER — HYDROMORPHONE HCL 1 MG/ML IJ SOLN
0.5000 mg | Freq: Once | INTRAMUSCULAR | Status: AC
  Administered 2017-06-08: 0.5 mg via INTRAVENOUS
  Filled 2017-06-08: qty 1

## 2017-06-08 MED ORDER — CYCLOBENZAPRINE HCL 10 MG PO TABS: 10.0000 mg | ORAL_TABLET | Freq: Three times a day (TID) | ORAL | 0 refills | Status: DC | PRN

## 2017-06-08 MED ORDER — HYDROCODONE-ACETAMINOPHEN 5-325 MG PO TABS: 1.0000 | ORAL_TABLET | Freq: Four times a day (QID) | ORAL | 0 refills | Status: DC | PRN

## 2017-06-08 MED ORDER — ONDANSETRON HCL 4 MG/2ML IJ SOLN
4.0000 mg | Freq: Once | INTRAMUSCULAR | Status: AC
  Administered 2017-06-08: 4 mg via INTRAVENOUS
  Filled 2017-06-08: qty 2

## 2017-06-08 NOTE — ED Provider Notes (Signed)
[facility] EMERGENCY DEPARTMENT Provider Note   CSN: [id number] Arrival date & time: 06/08/17  1601     History   Chief Complaint Chief Complaint  Patient presents with  . Back Pain    HPI Kenneth [name] is a 76 y.o. male.    Patient states he slipped and fell backwards and hit his head and his upper back.  No loss of consciousness.  Patient complains of a headache neck ache upper back pain.   The history is provided by the patient. No language interpreter was used.  Fall  This is a new problem. The current episode started 6 to 12 hours ago. The problem occurs rarely. The problem has been resolved. Pertinent negatives include no chest pain, no abdominal pain and no headaches. Exacerbated by: moving. Nothing relieves the symptoms. He has tried nothing for the symptoms. The treatment provided no relief.    Past Medical History:  Diagnosis Date  . Anxiety   . ASCVD (arteriosclerotic cardiovascular disease)   . Coronary artery disease    a. 2003: s/p CABG x3V  b. 2007: cath with patent bypass grafts.  c. 09/2016: [place] with cath showing patent bypass grafts, medical Rx recommended  . Depression   . GERD (gastroesophageal reflux disease)   . High triglycerides   . Hypertension   . 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 Nephrolithiasis   . Peripheral vascular disease ([place])   . Stroke ([place])   . Tobacco abuse   . Tracheal stenosis     Patient Active Problem List   Diagnosis Date Noted  . Chest pain 10/06/2016  . Essential hypertension 10/06/2016  . 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 Dyspnea 12/05/2015  . 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 Aftercare following surgery of the circulatory system, [place] 08/03/2013  . Occlusion and stenosis of carotid artery without mention of cerebral infarction 07/21/2012  . Preop cardiovascular exam 05/08/2012  . Carotid stenosis, bilateral 05/05/2012  . CAD in native artery 08/13/2011  . Malignant neoplasm of larynx ([place]) 12/14/2009  . HLD  (hyperlipidemia) 12/14/2009  . CEREBROVASCULAR ACCIDENT 12/14/2009  . GASTROESOPHAGEAL REFLUX DISEASE 12/14/2009  . 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 CARDIAC SURGERY    . CAROTID STENT Right 06-13-12  . CHOLECYSTECTOMY  2007   Gall Bladder  . CORONARY ARTERY BYPASS GRAFT    . EYE SURGERY    . PARTIAL LARYNGECTOMY  1998       Home Medications    Prior to Admission medications   Medication Sig Start Date End Date Taking? Authorizing Provider  amLODipine (NORVASC) 5 MG tablet Take 5 mg every evening by mouth.  03/22/17  Yes [provider]  aspirin 81 MG tablet Take 81 mg by mouth at bedtime.    Yes [provider]  diphenhydrAMINE (BENADRYL) 25 MG tablet Take 25 mg by mouth every 6 (six) hours as needed (for oral swelling).   Yes [provider]  metoprolol tartrate (LOPRESSOR) 25 MG tablet Take 1 tablet (25 mg total) by mouth 2 (two) times daily. 01/31/17  Yes Lendon Colonel, NP  nitroGLYCERIN (NITROSTAT) 0.4 MG SL tablet Place 1 tablet (0.4 mg total) under the tongue every 5 (five) minutes as needed for chest pain. 01/31/17 05/25/21 Yes Lendon Colonel, NP  Omega-3 Fatty Acids (OMEGA-3 FISH OIL PO) Take 1 capsule by mouth daily.   Yes [provider]  omeprazole (PRILOSEC) 20 MG capsule Take 20 mg every evening by  mouth.    Yes [provider]  PARoxetine (PAXIL) 20 MG tablet Take 20 mg by mouth at bedtime.    Yes [provider]  spironolactone (ALDACTONE) 25 MG tablet Take 25 mg every evening by mouth.   Yes [provider]  HYDROcodone-acetaminophen (NORCO/VICODIN) 5-325 MG tablet Take 1 tablet every 6 (six) hours as needed by mouth for moderate pain. 06/08/17   Milton Ferguson, MD    Family History Family History  Problem Relation Age of Onset  . Dementia Mother   . Heart disease Father   . Cancer Brother   . Heart disease Brother   . Hyperlipidemia Brother    . Cancer Brother   . Colon cancer Neg Hx     Social History Social History   Tobacco Use  . Smoking status: Former Smoker    Packs/day: 1.00    Years: 15.00    Pack years: 15.00    Types: Cigarettes    Start date: 04/22/1953    Last attempt to quit: 07/26/1970    Years since quitting: 46.9  . Smokeless tobacco: Former Systems developer    Types: Chew    Quit date: 05/05/2002  Substance Use Topics  . Alcohol use: No    Alcohol/week: 0.0 oz  . Drug use: No     Allergies   Nifedipine; Erythromycin; Lorazepam; Penicillins; Sulfonamide derivatives; and Statins   Review of Systems Review of Systems  Constitutional: Negative for appetite change and fatigue.  HENT: Negative for congestion, ear discharge and sinus pressure.   Eyes: Negative for discharge.  Respiratory: Negative for cough.   Cardiovascular: Negative for chest pain.  Gastrointestinal: Negative for abdominal pain and diarrhea.  Genitourinary: Negative for frequency and hematuria.  Musculoskeletal: Positive for back pain.  Skin: Negative for rash.  Neurological: Negative for seizures and headaches.  Psychiatric/Behavioral: Negative for hallucinations.     Physical Exam Updated Vital Signs BP (!) 160/82   Pulse (!) 57   Temp 98.2 F (36.8 C) (Oral)   Resp 18   Ht 5\' 6"  (1.676 m)   Wt 82.6 kg (182 lb)   SpO2 95%   BMI 29.38 kg/m   Physical Exam  Constitutional: He is oriented to person, place, and time. He appears well-developed.  HENT:  Head: Normocephalic.  Eyes: Conjunctivae and EOM are normal. No scleral icterus.  Neck: Neck supple. No thyromegaly present.  Cardiovascular: Normal rate and regular rhythm. Exam reveals no gallop and no friction rub.  No murmur heard. Pulmonary/Chest: No stridor. He has no wheezes. He has no rales. He exhibits no tenderness.  Abdominal: He exhibits no distension. There is no tenderness. There is no rebound.  Musculoskeletal: Normal range of motion. He exhibits no edema.    Tender thoracic spine and mild tender cervicle spine  Lymphadenopathy:    He has no cervical adenopathy.  Neurological: He is oriented to person, place, and time. He exhibits normal muscle tone. Coordination normal.  Skin: No rash noted. No erythema.  Psychiatric: He has a normal mood and affect. His behavior is normal.  Nursing note and vitals reviewed.    ED Treatments / Results  Labs (all labs ordered are listed, but only abnormal results are displayed) Labs Reviewed  I-STAT CHEM 8, ED - Abnormal; Notable for the following components:      Result Value   BUN 29 (*)    Creatinine, Ser 1.30 (*)    Glucose, Bld 116 (*)    Calcium, Ion 1.12 (*)  All other components within normal limits    EKG  EKG Interpretation None       Radiology Ct Abdomen Pelvis Wo Contrast  Result Date: 06/08/2017 CLINICAL DATA:  Patient fell today. Pain is reported to the mid to lower back. EXAM: CT CHEST, ABDOMEN AND PELVIS WITHOUT CONTRAST TECHNIQUE: Multidetector CT imaging of the chest, abdomen and pelvis was performed following the standard protocol without IV contrast. COMPARISON:  CT chest when 10/05/2016. FINDINGS: CT CHEST FINDINGS Cardiovascular: Prior CABG. No significant vascular findings. Normal heart size with three-vessel coronary calcification. No pericardial effusion. Mediastinum/Nodes: No enlarged mediastinal, hilar, or axillary lymph nodes. Thyroid gland, trachea, and esophagus demonstrate no significant findings. Lungs/Pleura: Lungs demonstrate scarring in the RIGHT middle lobe and lingula. No consolidation or airspace disease. There is no pneumothorax. The nodular characteristic of the RIGHT middle lobe scarring is stable from March. Airways appear patent. Musculoskeletal: Spondylosis.  No fracture. CT ABDOMEN PELVIS FINDINGS Hepatobiliary: No focal liver abnormality is seen. Status post cholecystectomy. No biliary dilatation. Pancreas: Unremarkable. No pancreatic ductal dilatation or  surrounding inflammatory changes. Spleen: No splenic injury or perisplenic hematoma. Adrenals/Urinary Tract: No adrenal hemorrhage or renal injury identified. Bladder is unremarkable. Stomach/Bowel: Stomach is within normal limits. Appendix appears normal. No evidence of bowel wall thickening, distention, or inflammatory changes. Vascular/Lymphatic: Aortic atherosclerosis. No enlarged abdominal or pelvic lymph nodes. Dominant aortic diameter 29 mm. Reproductive: Prostate is unremarkable. Other: No abdominal wall hernia or abnormality. No abdominopelvic ascites. Musculoskeletal: Osteopenia. L2 hemangioma. Facet arthropathy. No fracture. IMPRESSION: No acute findings in the chest, abdomen, or pelvis. Ectatic abdominal aorta at risk for aneurysm development. Recommend followup by ultrasound in 5 years. This recommendation follows ACR consensus guidelines: White Paper of the ACR Incidental Findings Committee II on Vascular Findings. J Am Coll Radiol 2013; 10:789-794. Electronically Signed   By: Staci Righter M.D.   On: 06/08/2017 18:00   Ct Head Wo Contrast  Result Date: 06/08/2017 CLINICAL DATA:  Fall today. EXAM: CT HEAD WITHOUT CONTRAST CT CERVICAL SPINE WITHOUT CONTRAST TECHNIQUE: Multidetector CT imaging of the head and cervical spine was performed following the standard protocol without intravenous contrast. Multiplanar CT image reconstructions of the cervical spine were also generated. COMPARISON:  Head CT 04/27/2012 and neck CTA 05/19/2012 FINDINGS: CT HEAD FINDINGS Brain: Ventricles, cisterns and other CSF spaces are within normal. There is no mass, mass effect, shift of midline structures or acute hemorrhage. No evidence of acute infarction. Moderate chronic ischemic microvascular disease. Vascular: No hyperdense vessel or unexpected calcification. Skull: Normal. Negative for fracture or focal lesion. Sinuses/Orbits: No acute finding. Other: None. CT CERVICAL SPINE FINDINGS Alignment: Normal. Skull base  and vertebrae: Moderate spondylosis of the cervical spine. Vertebral body heights are normal. Atlantoaxial articulation is unremarkable. There is uncovertebral joint spurring and facet arthropathy. No evidence of acute fracture or subluxation. Bilateral neural foraminal narrowing at the C5-6 level right worse than left. Soft tissues and spinal canal: No prevertebral fluid or swelling. No visible canal hematoma. Disc levels:  Minimal disc space narrowing at the C5-6 level. Upper chest: Unremarkable. Other: Right carotid stent present. Calcifications over the left carotid bifurcation. IMPRESSION: No acute intracranial findings. Moderate chronic ischemic microvascular disease. No acute cervical spine injury. Moderate spondylosis of the cervical spine with mild disc disease at the C5-6 level. Bilateral neural foraminal narrowing at the C5-6 level right worse than left. Electronically Signed   By: Marin Olp M.D.   On: 06/08/2017 18:02   Ct Chest Wo Contrast  Result Date: 06/08/2017 CLINICAL DATA:  Patient fell today. Pain is reported to the mid to lower back. EXAM: CT CHEST, ABDOMEN AND PELVIS WITHOUT CONTRAST TECHNIQUE: Multidetector CT imaging of the chest, abdomen and pelvis was performed following the standard protocol without IV contrast. COMPARISON:  CT chest when 10/05/2016. FINDINGS: CT CHEST FINDINGS Cardiovascular: Prior CABG. No significant vascular findings. Normal heart size with three-vessel coronary calcification. No pericardial effusion. Mediastinum/Nodes: No enlarged mediastinal, hilar, or axillary lymph nodes. Thyroid gland, trachea, and esophagus demonstrate no significant findings. Lungs/Pleura: Lungs demonstrate scarring in the RIGHT middle lobe and lingula. No consolidation or airspace disease. There is no pneumothorax. The nodular characteristic of the RIGHT middle lobe scarring is stable from March. Airways appear patent. Musculoskeletal: Spondylosis.  No fracture. CT ABDOMEN PELVIS  FINDINGS Hepatobiliary: No focal liver abnormality is seen. Status post cholecystectomy. No biliary dilatation. Pancreas: Unremarkable. No pancreatic ductal dilatation or surrounding inflammatory changes. Spleen: No splenic injury or perisplenic hematoma. Adrenals/Urinary Tract: No adrenal hemorrhage or renal injury identified. Bladder is unremarkable. Stomach/Bowel: Stomach is within normal limits. Appendix appears normal. No evidence of bowel wall thickening, distention, or inflammatory changes. Vascular/Lymphatic: Aortic atherosclerosis. No enlarged abdominal or pelvic lymph nodes. Dominant aortic diameter 29 mm. Reproductive: Prostate is unremarkable. Other: No abdominal wall hernia or abnormality. No abdominopelvic ascites. Musculoskeletal: Osteopenia. L2 hemangioma. Facet arthropathy. No fracture. IMPRESSION: No acute findings in the chest, abdomen, or pelvis. Ectatic abdominal aorta at risk for aneurysm development. Recommend followup by ultrasound in 5 years. This recommendation follows ACR consensus guidelines: White Paper of the ACR Incidental Findings Committee II on Vascular Findings. J Am Coll Radiol 2013; 10:789-794. Electronically Signed   By: Staci Righter M.D.   On: 06/08/2017 18:00   Ct Cervical Spine Wo Contrast  Result Date: 06/08/2017 CLINICAL DATA:  Fall today. EXAM: CT HEAD WITHOUT CONTRAST CT CERVICAL SPINE WITHOUT CONTRAST TECHNIQUE: Multidetector CT imaging of the head and cervical spine was performed following the standard protocol without intravenous contrast. Multiplanar CT image reconstructions of the cervical spine were also generated. COMPARISON:  Head CT 04/27/2012 and neck CTA 05/19/2012 FINDINGS: CT HEAD FINDINGS Brain: Ventricles, cisterns and other CSF spaces are within normal. There is no mass, mass effect, shift of midline structures or acute hemorrhage. No evidence of acute infarction. Moderate chronic ischemic microvascular disease. Vascular: No hyperdense vessel or  unexpected calcification. Skull: Normal. Negative for fracture or focal lesion. Sinuses/Orbits: No acute finding. Other: None. CT CERVICAL SPINE FINDINGS Alignment: Normal. Skull base and vertebrae: Moderate spondylosis of the cervical spine. Vertebral body heights are normal. Atlantoaxial articulation is unremarkable. There is uncovertebral joint spurring and facet arthropathy. No evidence of acute fracture or subluxation. Bilateral neural foraminal narrowing at the C5-6 level right worse than left. Soft tissues and spinal canal: No prevertebral fluid or swelling. No visible canal hematoma. Disc levels:  Minimal disc space narrowing at the C5-6 level. Upper chest: Unremarkable. Other: Right carotid stent present. Calcifications over the left carotid bifurcation. IMPRESSION: No acute intracranial findings. Moderate chronic ischemic microvascular disease. No acute cervical spine injury. Moderate spondylosis of the cervical spine with mild disc disease at the C5-6 level. Bilateral neural foraminal narrowing at the C5-6 level right worse than left. Electronically Signed   By: Marin Olp M.D.   On: 06/08/2017 18:02    Procedures Procedures (including critical care time)  Medications Ordered in ED Medications  HYDROmorphone (DILAUDID) injection 0.5 mg (0.5 mg Intravenous Given 06/08/17 1657)  ondansetron (ZOFRAN) injection 4 mg (  4 mg Intravenous Given 06/08/17 1657)  HYDROmorphone (DILAUDID) injection 0.5 mg (0.5 mg Intravenous Given 06/08/17 1805)     Initial Impression / Assessment and Plan / ED Course  I have reviewed the triage vital signs and the nursing notes.  Pertinent labs & imaging results that were available during my care of the patient were reviewed by me and considered in my medical decision making (see chart for details).     Patient with a fall.  He has contusion to head cervical strain and contusions of upper and lower back.  Patient will be placed on Flexeril and Vicodin and  follow-up with PCP  Final Clinical Impressions(s) / ED Diagnoses   Final diagnoses:  Fall, initial encounter    ED Discharge Orders        Ordered    HYDROcodone-acetaminophen (NORCO/VICODIN) 5-325 MG tablet  Every 6 hours PRN     06/08/17 1810       Milton Ferguson, MD 06/08/17 1819

## 2017-06-08 NOTE — ED Triage Notes (Addendum)
Pt brought in by [facility]. Pt got his feet tangled up and fell today. Pt c/o pain to mid and lower back. No tingling/numbness in legs. Denies LOC. Pt has c-collar in place by EMS. Pt originally c/o pain to neck when first responders arrived, but pt is now denying neck pain. Pt was given Tylenol 1000mg  by EMS with no relief of pain.

## 2017-06-08 NOTE — Discharge Instructions (Signed)
Rest at home the next 2-3 days and follow-up with your primary care doctor next week for recheck

## 2017-06-24 DIAGNOSIS — I2510 Atherosclerotic heart disease of native coronary artery without angina pectoris: Secondary | ICD-10-CM | POA: Diagnosis not present

## 2017-06-24 DIAGNOSIS — N200 Calculus of kidney: Secondary | ICD-10-CM | POA: Diagnosis not present

## 2017-06-24 DIAGNOSIS — E785 Hyperlipidemia, unspecified: Secondary | ICD-10-CM | POA: Diagnosis not present

## 2017-06-30 DIAGNOSIS — Z0001 Encounter for general adult medical examination with abnormal findings: Secondary | ICD-10-CM | POA: Diagnosis not present

## 2017-06-30 DIAGNOSIS — I700 Atherosclerosis of aorta: Secondary | ICD-10-CM | POA: Diagnosis not present

## 2017-06-30 DIAGNOSIS — R001 Bradycardia, unspecified: Secondary | ICD-10-CM | POA: Diagnosis not present

## 2017-06-30 DIAGNOSIS — Z6830 Body mass index (BMI) 30.0-30.9, adult: Secondary | ICD-10-CM | POA: Diagnosis not present

## 2017-07-15 ENCOUNTER — Other Ambulatory Visit: Payer: Self-pay

## 2017-07-15 DIAGNOSIS — I6523 Occlusion and stenosis of bilateral carotid arteries: Secondary | ICD-10-CM

## 2017-08-25 ENCOUNTER — Ambulatory Visit (INDEPENDENT_AMBULATORY_CARE_PROVIDER_SITE_OTHER): Payer: Medicare HMO | Admitting: Family

## 2017-08-25 ENCOUNTER — Encounter: Payer: Self-pay | Admitting: Family

## 2017-08-25 ENCOUNTER — Other Ambulatory Visit: Payer: Self-pay

## 2017-08-25 ENCOUNTER — Ambulatory Visit (HOSPITAL_COMMUNITY)
Admission: RE | Admit: 2017-08-25 | Discharge: 2017-08-25 | Disposition: A | Discharge status: Home / Self Care | Payer: Medicare HMO | Source: Ambulatory Visit | Attending: Family | Admitting: Family

## 2017-08-25 VITALS — BP 145/86 | HR 47 | Temp 97.0°F | Resp 18 | Ht 65.0 in | Wt 180.0 lb

## 2017-08-25 DIAGNOSIS — Z9889 Other specified postprocedural states: Secondary | ICD-10-CM

## 2017-08-25 DIAGNOSIS — Z95828 Presence of other vascular implants and grafts: Secondary | ICD-10-CM

## 2017-08-25 DIAGNOSIS — I6523 Occlusion and stenosis of bilateral carotid arteries: Secondary | ICD-10-CM | POA: Diagnosis not present

## 2017-08-25 DIAGNOSIS — Z923 Personal history of irradiation: Secondary | ICD-10-CM | POA: Diagnosis not present

## 2017-08-25 LAB — VAS US CAROTID
LEFT ECA DIAS: -15 cm/s
LEFT VERTEBRAL DIAS: -12 cm/s
Left CCA dist dias: -23 cm/s
Left CCA dist sys: -117 cm/s
Left CCA prox dias: 27 cm/s
Left CCA prox sys: 146 cm/s
Left ICA dist dias: -37 cm/s
Left ICA dist sys: -130 cm/s
Left ICA prox dias: -41 cm/s
Left ICA prox sys: -161 cm/s
RIGHT ECA DIAS: 40 cm/s
RIGHT VERTEBRAL DIAS: -11 cm/s
Right CCA prox dias: 13 cm/s
Right CCA prox sys: 58 cm/s
Right cca dist sys: -69 cm/s

## 2017-08-25 NOTE — Progress Notes (Signed)
Chief Complaint: Follow up Extracranial Carotid Artery Stenosis   History of Present Illness  Kenneth [name] is a 77 y.o. male who is s/p right carotid artery stent on 06/13/12 by Dr. Bridgett Larsson.  He returns today for Duplex surveillance and evaluation. He reports 3-4 TIA's preoperative to the the right CEA as manifested by left hand numbness that resolved in a few hours, denies any residual problem in his left hand.  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  He has lumbar spine issues, denies claudication symptoms with walking, denies non-healing wounds.  Pt Diabetic: No Pt smoker: non-smoker  Pt meds include: Statin : No: hives/rash/uricaria reaction to all statins ASA: Yes Other anticoagulants/antiplatelets: Plavix   Past Medical History:  Diagnosis Date  . Anxiety   . ASCVD (arteriosclerotic cardiovascular disease)   . Coronary artery disease    a. 2003: s/p CABG x3V  b. 2007: cath with patent bypass grafts.  c. 09/2016: [place] with cath showing patent bypass grafts, medical Rx recommended  . Depression   . GERD (gastroesophageal reflux disease)   . High triglycerides   . Hypertension   . Laryngeal carcinoma ([place])   . Nephrolithiasis   . Peripheral vascular disease ([place])   . Stroke ([place])   . Tobacco abuse   . Tracheal stenosis     Social History Social History   Tobacco Use  . Smoking status: Former Smoker    Packs/day: 1.00    Years: 15.00    Pack years: 15.00    Types: Cigarettes    Start date: 04/22/1953    Last attempt to quit: 07/26/1970    Years since quitting: 47.1  . Smokeless tobacco: Former Systems developer    Types: Chew    Quit date: 05/05/2002  Substance Use Topics  . Alcohol  use: No    Alcohol/week: 0.0 oz  . Drug use: No    Family History Family History  Problem Relation Age of Onset  . Dementia Mother   . Heart disease Father   . Cancer Brother   . Heart disease Brother   . Hyperlipidemia Brother   . Cancer Brother   . Colon cancer Neg Hx     Surgical History Past Surgical History:  Procedure Laterality Date  . CARDIAC SURGERY    . CAROTID STENT Right 06-13-12  . CAROTID STENT INSERTION N/A 06/13/2012   Procedure: CAROTID STENT INSERTION;  Surgeon: Serafina Mitchell, MD;  Location: [facility] CATH LAB;  Service: Cardiovascular;  Laterality: N/A;  . CHOLECYSTECTOMY  2007   Gall Bladder  . COLONOSCOPY  11/10/2011   Procedure: COLONOSCOPY;  Surgeon: Daneil Dolin, MD;  [facility];  Service: Endoscopy;  Laterality: N/A;  8:30 AM  . CORONARY ARTERY BYPASS GRAFT    . EYE SURGERY    . LEFT HEART CATH AND CORS/GRAFTS ANGIOGRAPHY N/A 10/07/2016   Procedure: Left Heart Cath and Cors/Grafts Angiography;  Surgeon: Troy Sine, MD;  Location: [facility] CV LAB;  Service: Cardiovascular;  Laterality: N/A;  . PARTIAL LARYNGECTOMY  1998    Allergies  Allergen Reactions  . Nifedipine Other (See Comments)  . Erythromycin   . Lorazepam Other (See Comments)  REACTION: Alters mental status. "Turns into maniac"  . Penicillins Hives    Has patient had a PCN reaction causing immediate rash, facial/tongue/throat swelling, SOB or lightheadedness with hypotension: Yes Has patient had a PCN reaction causing severe rash involving mucus membranes or skin necrosis: No Has patient had a PCN reaction that required hospitalization No Has patient had a PCN reaction occurring within the last 10 years: No If all of the above answers are "NO", then may proceed with Cephalosporin use.   . Sulfonamide Derivatives Other (See Comments)    REACTION: Unknown to patient. States that MD states allergy per medical records  . Statins Itching and Rash    Current Outpatient  Medications  Medication Sig Dispense Refill  . amLODipine (NORVASC) 5 MG tablet Take 5 mg every evening by mouth.     Marland Kitchen aspirin 81 MG tablet Take 81 mg by mouth at bedtime.     . cyclobenzaprine (FLEXERIL) 10 MG tablet Take 1 tablet (10 mg total) 3 (three) times daily as needed by mouth for muscle spasms. 20 tablet 0  . metoprolol tartrate (LOPRESSOR) 25 MG tablet Take 1 tablet (25 mg total) by mouth 2 (two) times daily. 180 tablet 3  . nitroGLYCERIN (NITROSTAT) 0.4 MG SL tablet Place 1 tablet (0.4 mg total) under the tongue every 5 (five) minutes as needed for chest pain. 25 tablet 3  . Omega-3 Fatty Acids (OMEGA-3 FISH OIL PO) Take 1 capsule by mouth daily.    Marland Kitchen omeprazole (PRILOSEC) 20 MG capsule Take 20 mg every evening by mouth.     Marland Kitchen PARoxetine (PAXIL) 20 MG tablet Take 20 mg by mouth at bedtime.     Marland Kitchen spironolactone (ALDACTONE) 25 MG tablet Take 25 mg every evening by mouth.    . diphenhydrAMINE (BENADRYL) 25 MG tablet Take 25 mg by mouth every 6 (six) hours as needed (for oral swelling).    Marland Kitchen HYDROcodone-acetaminophen (NORCO/VICODIN) 5-325 MG tablet Take 1 tablet every 6 (six) hours as needed by mouth for moderate pain. (Patient not taking: Reported on 08/25/2017) 20 tablet 0   No current facility-administered medications for this visit.     Review of Systems : See HPI for pertinent positives and negatives.  Physical Examination  Vitals:   08/25/17 1136 08/25/17 1144 08/25/17 1146  BP: (!) 141/81 (!) 156/80 (!) 145/86  Pulse: (!) 47 (!) 47 (!) 47  Resp: 18    Temp: (!) 97 F (36.1 C)    TempSrc: Oral    SpO2: 97%    Weight: 180 lb (81.6 kg)    Height: 5\' 5"  (1.651 m)     Body mass index is 29.95 kg/m.  General: WDWN male in NAD GAIT: normal HENT: No appreciable abnormalities  Eyes: PERRLA Pulmonary: Respirations are non labored, CTAB, no rales, rhonchi, or wheezing. Voice is in a whisper. Cardiac: regular rhythm, no appreciable murmur.   VASCULAR EXAM Carotid  Bruits Left Right   Negative Negative     Abdominal aortic pulse is not palpable.  Radial pulses are 3+ palpable and equal.  LE Pulses LEFT RIGHT       POPLITEAL  not palpable   not palpable       POSTERIOR TIBIAL   palpable    palpable        DORSALIS PEDIS      ANTERIOR TIBIAL not palpable   palpable     Gastrointestinal: soft, nontender, BS WNL, no r/g, no palpable masses. Musculoskeletal: No muscle atrophy/wasting. M/S 5/5 throughout, Extremities without ischemic changes. Skin: No rash, no cellulitis, no ulcers noted.  Neurologic: A&O X 3; appropriate affect, normal sensation bilaterally, speech is in a raspy whisper, CN 2-12 intact except for hearing loss, Pain and light touch intact in extremities, Motor exam as listed above Psychiatric: Normal thought content, mood appropriate to clinical situation.       Assessment: Kenneth [name] is a 77 y.o. male who presents s/p right carotid artery stent on 06/13/12. He had 3-4 TIA's preoperative to the the right CEA as manifested by left hand numbness that resolved in a few hours, denies any residual problem in his left hand.  He had radiation tx to his neck s/p laryngectomy in 1998 for laryngeal cancer.  Fortunately he does not have DM and has never used tobacco.   He has a letter from his ENT stating that if he ever needs to be intubated a small ETT of 4.0 or less needs to be used.    DATA Right ICA: 1-39% stenosis. <50% stenosis in the right CCA. Right ECA: >50% stenosis. Left ICA: 40-59% stenosis Bilateral vertebral artery flow is antegrade.  Bilateral subclavian artery waveforms are normal.  No significant change compared to the exam on 08-17-16.    Plan: Follow-up in 1 year with Carotid Duplex scan.   I discussed in depth with the patient the nature of atherosclerosis,  and emphasized the importance of maximal medical management including strict control of blood pressure, blood glucose, and lipid levels, obtaining regular exercise, and continued cessation of smoking.  The patient is aware that without maximal medical management the underlying atherosclerotic disease process will progress, limiting the benefit of any interventions. The patient was given information about stroke prevention and what symptoms should prompt the patient to seek immediate medical care. Thank you for allowing [place] to participate in this patient's care.  Clemon Chambers, RN, MSN, FNP-C [facility] Office: [phone]  Clinic Physician: Oneida Alar  08/25/17 11:57 AM

## 2017-08-25 NOTE — Patient Instructions (Signed)

## 2017-08-25 NOTE — Progress Notes (Signed)
Vitals:   08/25/17 1136 08/25/17 1144  BP: (!) 141/81 (!) 156/80  Pulse: (!) 47 (!) 47  Resp: 18   Temp: (!) 97 F (36.1 C)   TempSrc: Oral   SpO2: 97%   Weight: 180 lb (81.6 kg)   Height: 5\' 5"  (1.651 m)

## 2017-08-26 ENCOUNTER — Encounter: Payer: Self-pay | Admitting: Cardiology

## 2017-08-26 ENCOUNTER — Ambulatory Visit: Payer: Medicare HMO | Admitting: Cardiology

## 2017-08-26 ENCOUNTER — Other Ambulatory Visit: Payer: Self-pay

## 2017-08-26 VITALS — BP 138/82 | HR 60 | Ht 66.0 in | Wt 183.6 lb

## 2017-08-26 DIAGNOSIS — I6523 Occlusion and stenosis of bilateral carotid arteries: Secondary | ICD-10-CM | POA: Diagnosis not present

## 2017-08-26 DIAGNOSIS — E782 Mixed hyperlipidemia: Secondary | ICD-10-CM | POA: Diagnosis not present

## 2017-08-26 DIAGNOSIS — I10 Essential (primary) hypertension: Secondary | ICD-10-CM

## 2017-08-26 DIAGNOSIS — I2510 Atherosclerotic heart disease of native coronary artery without angina pectoris: Secondary | ICD-10-CM

## 2017-08-26 MED ORDER — AMLODIPINE BESYLATE 10 MG PO TABS: 10.0000 mg | ORAL_TABLET | Freq: Every day | ORAL | 3 refills | Status: DC

## 2017-08-26 MED ORDER — METOPROLOL TARTRATE 25 MG PO TABS: 12.5000 mg | ORAL_TABLET | Freq: Two times a day (BID) | ORAL | 3 refills | Status: DC

## 2017-08-26 NOTE — Patient Instructions (Signed)
Medication Instructions:  INCREASE NORVASC TO 10 MG DAILY  DECREASE LOPRESSOR TO 12.5 MG TWO TIMES DAILY  Labwork: I WILL REQUEST LABS FROM PCP.   Testing/Procedures: NONE  Follow-Up: Your physician wants you to follow-up in: 6 MONTHS .  You will receive a reminder letter in the mail two months in advance. If you don't receive a letter, please call our office to schedule the follow-up appointment.   Any Other Special Instructions Will Be Listed Below (If Applicable).     If you need a refill on your cardiac medications before your next appointment, please call your pharmacy.

## 2017-08-26 NOTE — Progress Notes (Signed)
Clinical Summary Kenneth [name] is a 77 y.o.male seen today for follow up of the following medical problems.   1. CAD  - prior CABG in 2003, repeat cath 2007 with patent grafts.  - 07/2011 MPI low risk study, no ischemia.  - echo 04/2012 LVEF 65-60%, grade II diastolic dysfunction  - 03/2425 echo showed LVEF 65-70%, grade II diastolic dysfunction.  - 11/2013 MPI no ischemia.     - no recent chest pain. No new SOB.   2. Hyperlipidemia  - reported history of rash on statin, he reports tried multiple and all caused rash  - 05/2017 TC 199 TG 212 HDL 35 LDL 122   3. HTN  - home bp's up and down. Lows 141/77 and highest 180s/90s - compliant with meds  4. Carotid stenosis  - prior carotid stenting  - followed by vascular  5. SOB - remote 10-15 year history of smoking. Used to work in [place], mainly with walking up incline.   6. Bradycardia - notes some HRs in 40s at times. Denies any symptoms.   7. Throat cancer  s/p radiation and surgery - has had some SOB after - followed at [facility] Past Medical History:  Diagnosis Date  . Anxiety   . ASCVD (arteriosclerotic cardiovascular disease)   . Coronary artery disease    a. 2003: s/p CABG x3V  b. 2007: cath with patent bypass grafts.  c. 09/2016: [place] with cath showing patent bypass grafts, medical Rx recommended  . Depression   . GERD (gastroesophageal reflux disease)   . High triglycerides   . Hypertension   . Laryngeal carcinoma ([place])   . Nephrolithiasis   . Peripheral vascular disease ([place])   . Stroke ([place])   . Tobacco abuse   . Tracheal stenosis      Allergies  Allergen Reactions  . Nifedipine Other (See Comments)  . Erythromycin   . Lorazepam Other (See Comments)    REACTION: Alters mental status. "Turns into maniac"  . Penicillins Hives    Has patient had a PCN reaction causing immediate rash, facial/tongue/throat swelling, SOB or lightheadedness with hypotension:  Yes Has patient had a PCN reaction causing severe rash involving mucus membranes or skin necrosis: No Has patient had a PCN reaction that required hospitalization No Has patient had a PCN reaction occurring within the last 10 years: No If all of the above answers are "NO", then may proceed with Cephalosporin use.   . Sulfonamide Derivatives Other (See Comments)    REACTION: Unknown to patient. States that MD states allergy per medical records  . Statins Itching and Rash     Current Outpatient Medications  Medication Sig Dispense Refill  . amLODipine (NORVASC) 5 MG tablet Take 5 mg every evening by mouth.     Marland Kitchen aspirin 81 MG tablet Take 81 mg by mouth at bedtime.     . cyclobenzaprine (FLEXERIL) 10 MG tablet Take 1 tablet (10 mg total) 3 (three) times daily as needed by mouth for muscle spasms. 20 tablet 0  . diphenhydrAMINE (BENADRYL) 25 MG tablet Take 25 mg by mouth every 6 (six) hours as needed (for oral swelling).    Marland Kitchen HYDROcodone-acetaminophen (NORCO/VICODIN) 5-325 MG tablet Take 1 tablet every 6 (six) hours as needed by mouth for moderate pain. (Patient not taking: Reported on 08/25/2017) 20 tablet 0  . metoprolol tartrate (LOPRESSOR) 25 MG tablet Take 1 tablet (25 mg total) by mouth 2 (two) times daily. [place]  tablet 3  . nitroGLYCERIN (NITROSTAT) 0.4 MG SL tablet Place 1 tablet (0.4 mg total) under the tongue every 5 (five) minutes as needed for chest pain. 25 tablet 3  . Omega-3 Fatty Acids (OMEGA-3 FISH OIL PO) Take 1 capsule by mouth daily.    Marland Kitchen omeprazole (PRILOSEC) 20 MG capsule Take 20 mg every evening by mouth.     Marland Kitchen PARoxetine (PAXIL) 20 MG tablet Take 20 mg by mouth at bedtime.     Marland Kitchen spironolactone (ALDACTONE) 25 MG tablet Take 25 mg every evening by mouth.     No current facility-administered medications for this visit.      Past Surgical History:  Procedure Laterality Date  . CARDIAC SURGERY    . CAROTID STENT Right 06-13-12  . CAROTID STENT INSERTION N/A 06/13/2012    Procedure: CAROTID STENT INSERTION;  Surgeon: Serafina Mitchell, MD;  Location: [facility] CATH LAB;  Service: Cardiovascular;  Laterality: N/A;  . CHOLECYSTECTOMY  2007   Gall Bladder  . COLONOSCOPY  11/10/2011   Procedure: COLONOSCOPY;  Surgeon: Daneil Dolin, MD;  [facility];  Service: Endoscopy;  Laterality: N/A;  8:30 AM  . CORONARY ARTERY BYPASS GRAFT    . EYE SURGERY    . LEFT HEART CATH AND CORS/GRAFTS ANGIOGRAPHY N/A 10/07/2016   Procedure: Left Heart Cath and Cors/Grafts Angiography;  Surgeon: Troy Sine, MD;  Location: [facility] CV LAB;  Service: Cardiovascular;  Laterality: N/A;  . PARTIAL LARYNGECTOMY  1998     Allergies  Allergen Reactions  . Nifedipine Other (See Comments)  . Erythromycin   . Lorazepam Other (See Comments)    REACTION: Alters mental status. "Turns into maniac"  . Penicillins Hives    Has patient had a PCN reaction causing immediate rash, facial/tongue/throat swelling, SOB or lightheadedness with hypotension: Yes Has patient had a PCN reaction causing severe rash involving mucus membranes or skin necrosis: No Has patient had a PCN reaction that required hospitalization No Has patient had a PCN reaction occurring within the last 10 years: No If all of the above answers are "NO", then may proceed with Cephalosporin use.   . Sulfonamide Derivatives Other (See Comments)    REACTION: Unknown to patient. States that MD states allergy per medical records  . Statins Itching and Rash      Family History  Problem Relation Age of Onset  . Dementia Mother   . Heart disease Father   . Cancer Brother   . Heart disease Brother   . Hyperlipidemia Brother   . Cancer Brother   . Colon cancer Neg Hx      Social History Kenneth [name] reports that he quit smoking about 47 years ago. His smoking use included cigarettes. He started smoking about 64 years ago. He has a 15.00 pack-year smoking history. He quit smokeless tobacco use about 15 years ago. His  smokeless tobacco use included chew. Kenneth [name] reports that he does not drink alcohol.   Review of Systems CONSTITUTIONAL: No weight loss, fever, chills, weakness or fatigue.  HEENT: Eyes: No visual loss, blurred vision, double vision or yellow sclerae.No hearing loss, sneezing, congestion, runny nose or sore throat.  SKIN: No rash or itching.  CARDIOVASCULAR: per hpi RESPIRATORY: No shortness of breath, cough or sputum.  GASTROINTESTINAL: No anorexia, nausea, vomiting or diarrhea. No abdominal pain or blood.  GENITOURINARY: No burning on urination, no polyuria NEUROLOGICAL: No headache, dizziness, syncope, paralysis, ataxia, numbness or tingling in the extremities. No change  in bowel or bladder control.  MUSCULOSKELETAL: No muscle, back pain, joint pain or stiffness.  LYMPHATICS: No enlarged nodes. No history of splenectomy.  PSYCHIATRIC: No history of depression or anxiety.  ENDOCRINOLOGIC: No reports of sweating, cold or heat intolerance. No polyuria or polydipsia.  Marland Kitchen   Physical Examination Vitals:   08/26/17 1106  BP: 138/82  Pulse: 60  SpO2: 97%   Vitals:   08/26/17 1106  Weight: 183 lb 9.6 oz (83.3 kg)  Height: _0  (1.676 m)    Gen: resting comfortably, no acute distress HEENT: no scleral icterus, pupils equal round and reactive, no palptable cervical adenopathy,  CV: RRR, no m/r/g, no jvd Resp: Clear to auscultation bilaterally GI: abdomen is soft, non-tender, non-distended, normal bowel sounds, no hepatosplenomegaly MSK: extremities are warm, no edema.  Skin: warm, no rash Neuro:  no focal deficits Psych: appropriate affect   Diagnostic Studies 07/2011 MPI  Tomographic views were obtained using the short axis, vertical long axis, and horizontal long axis planes. No significant, reversible perfusion defects are noted to indicate ischemia.  Gated imaging reveals an EDV of 59, ESV of 18, T I D ratio of 0.80, and LVEF of 69%.  IMPRESSION: Low risk  exercise/Lexiscan Myoview as outlined. No diagnostic ST- segment changes or arrhythmias were noted. There is evidence of soft tissue attenuation, however no frank scar or ischemia. LVEF is normal at 69%.  04/2012 Echo  LVEF 65-70%, grade II diastolic dysfunction,   8/52/77 Echo Study Conclusions  - Left ventricle: The cavity size was normal. Wall thickness was increased in a pattern of mild LVH. Systolic function was vigorous. The estimated ejection fraction was in the range of 65% to 70%. Wall motion was normal; there were no regional wall motion abnormalities. Features are consistent with a pseudonormal left ventricular filling pattern, with concomitant abnormal relaxation and increased filling pressure (grade 2 diastolic dysfunction). - Aortic valve: Mildly calcified annulus. Trileaflet. Trivial regurgitation. - Mitral valve: Calcified annulus. Trivial regurgitation. - Left atrium: The atrium was mildly dilated. - Right ventricle: Systolic function was low normal. - Right atrium: Central venous pressure: 64m Hg (est). - Atrial septum: No defect or patent foramen ovale was identified. - Tricuspid valve: Trivial regurgitation. - Pulmonary arteries: Systolic pressure could not be accurately estimated. - Pericardium, extracardiac: There was no pericardial effusion. Impressions:  - Normal LV chamber size with mild LVH and LVEF 682-42% grade 2 diastolic dysfunction. MIld left atrial enlargement. Mild MAC. Low normal RV contraction. Unable to assess PASP - trivial tricuspid regurgitation.  11/2013 Lexiscan MPI IMPRESSION: 1. Negative Lexiscan MPI for ischemia  2. Normal left ventricular systolic function, LVEF 635% 3. Low risk study for major cardiac events.   09/2016 cath  LM lesion, 50 %stenosed.  Prox LAD to Mid LAD lesion, 70 %stenosed.  Ost 1st Mrg lesion, 100 %stenosed.  RIMA and is normal in caliber and anatomically normal.  RPDA-2 lesion, 50  %stenosed.  RPDA-1 lesion, 60 %stenosed.  LIMA.  The left ventricular systolic function is normal.  LV end diastolic pressure is normal.   Normal LV function without focal segmental wall motion abnormalities.  Multivessel  native CAD with 50% ostial stenosis of the LAD and diffuse 70% mid stenoses, occlusion of the OM1 vessel of the circumflex, and diffuse 60 and 50% PDA stenoses in the small caliber PDA vessel.  Patent sequential LIMA graft supplying a twin like diagonal and LAD system system.  Patent RIMA graft supplying the circumflex marginal vessel.  RECOMMENDATION: Medical therapy.    Assessment and Plan  1. CAD  - no recent symptoms, continue current meds  2. Hyperlipidemia  - not tolerant of statins, currently not on therapy  - we will start zetia 5m daily pending recent pcp labs results  3. HTN  - above goal. With bradycardia lower lopressor to 12.542mbid, increase norvasc to 1029maily.   4. Carotid stenosis  - continue regular vascular f/u    F/u 6 months      [email or web address].

## 2017-08-31 ENCOUNTER — Encounter: Payer: Self-pay | Admitting: Cardiology

## 2017-09-07 ENCOUNTER — Telehealth: Payer: Self-pay

## 2017-09-07 MED ORDER — EZETIMIBE 10 MG PO TABS: 10.0000 mg | ORAL_TABLET | Freq: Every day | ORAL | 3 refills | Status: DC

## 2017-09-07 NOTE — Telephone Encounter (Signed)
-----   Message from Arnoldo Lenis, MD sent at 08/31/2017  1:28 PM EST ----- Yes you are right. Can we call him and have him start zetia 10mg  daily, I don't recall if I asked you to do that already.    Zandra Abts MD ----- Message ----- From: Drema Dallas, CMA Sent: 08/31/2017   1:05 PM To: Arnoldo Lenis, MD  Aren't those the ones I gave you Friday?  ----- Message ----- From: Arnoldo Lenis, MD Sent: 08/31/2017  12:45 PM To: Arnoldo Lenis, MD, Drema Dallas, CMA  Did we get this patients labs from his pcp?   Zandra Abts MD

## 2017-09-07 NOTE — Telephone Encounter (Signed)
Called pt., no answer. Left message for pt to return call.  

## 2017-09-15 ENCOUNTER — Encounter: Payer: Self-pay | Admitting: Gastroenterology

## 2017-09-15 ENCOUNTER — Ambulatory Visit (INDEPENDENT_AMBULATORY_CARE_PROVIDER_SITE_OTHER): Payer: Medicare HMO | Admitting: Gastroenterology

## 2017-09-15 DIAGNOSIS — K5900 Constipation, unspecified: Secondary | ICD-10-CM | POA: Insufficient documentation

## 2017-09-15 NOTE — Patient Instructions (Signed)
For constipation: I would like for you to start taking Amitiza one gelcap twice a day with food. The side effect to watch for is nausea, which could happen if you don't take with food. Please let me know how you like this dosage, because we can adjust it if needed.  We will see you in 6 weeks! We can see how you are doing and discuss a colonoscopy at that time.  It was a pleasure to see you today. I strive to create trusting relationships with patients to provide genuine, compassionate, and quality care. I value your feedback. If you receive a survey regarding your visit,  I greatly appreciate you the taking time to fill this out.   Annitta Needs, PhD, ANP-BC [facility]

## 2017-09-15 NOTE — Assessment & Plan Note (Signed)
77 year old male presenting with constipation worsening over the past year. No rectal bleeding, abdominal pain, weight loss, lack of appetite. Last colonoscopy in 2013 with adenoma: he would be due for surveillance but he is over the age of 50. 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 Mila Homer at [facility]. Tracheostomy has been recommended by Dr. Rowe Clack, but he has desired to hold off for now. Due to other co-morbidities, I discussed that after the age of 33, surveillance and screening was done on a case-by-case basis. We will pursue more aggressive bowel regimen; as he has no concerning signs/symptoms, would not pursue colonoscopy at this time but instead treat constipation and clinically follow. I discussed risks and benefits of this with him today. However, we will see him back in 6 weeks after trial of Amitiza 24 mcg BID and can discuss further at that point. Amitiza 24 mcg samples provided.

## 2017-09-15 NOTE — Progress Notes (Signed)
Primary Care Physician:  [name], MD Primary Gastroenterologist:  Dr. [name]   Chief Complaint  Patient presents with  . Constipation    bm's between 4-7 days, only has BM when he takes a laxative    HPI:   Kenneth [name] is a 77 y.o. male presenting today as a self-referral due to worsening constipation. Last seen in 2013 undergoing colonoscopy with tubular adenoma.    Feels like bowels are asleep. In his younger days, used to deal with constipation. Used to take Miralax and could go after 3 days. Now worsening over the past year to where it's not even working. Tried Metamucil, didn't help. In last month or 2 has had to try laxative pills 3-4 at a time every 3-4 days. Even with laxatives, stool is hard and dry. Difficult to pass. No urge to go. After passes hard stool, laxative makes him go with loose stool. Drinking 50-60 ounces of water a day. No rectal bleeding. No weight loss. Good appetite.   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 Sept 2018 in-office flex bronch by Dr. Rowe Clack [facility]: The nasal cavity and nasopharynx did not reveal any masses or lesions, mucosa appeared to be wnl. The tongue base, pharyngeal walls, piriform sinuses, vallecula, epiglottis and postcricoid region are normal in appearance. The visualized portion of the subglottis was widely patent. The trachea was without masses or lesions in the lumen. Each mainstem bronchi was free of stenosis, lesions, or secretions. 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 The patient had 50% narrowed glottic airway. There are no lesions on the free edge of the vocal folds nor elsewhere in the larynx worrisome for malignancy.      Past Medical History:  Diagnosis Date  . Anxiety   . ASCVD (arteriosclerotic cardiovascular disease)   . Coronary artery disease    a. 2003: s/p CABG x3V  b. 2007: cath with patent bypass grafts.  c.  09/2016: [place] with cath showing patent bypass grafts, medical Rx recommended  . Depression   . GERD (gastroesophageal reflux disease)   . High triglycerides   . Hypertension   . Laryngeal carcinoma ([place])   . Nephrolithiasis   . Peripheral vascular disease ([place])   . Stroke ([place])   . Tobacco abuse   . Tracheal stenosis     Past Surgical History:  Procedure Laterality Date  . CARDIAC SURGERY    . CAROTID STENT Right 06-13-12  . CAROTID STENT INSERTION N/A 06/13/2012   Procedure: CAROTID STENT INSERTION;  Surgeon: Serafina Mitchell, MD;  Location: [facility] CATH LAB;  Service: Cardiovascular;  Laterality: N/A;  . CHOLECYSTECTOMY  2007   Gall Bladder  . COLONOSCOPY  11/10/2011   Procedure: COLONOSCOPY;  Surgeon: Daneil Dolin, MD;  Location: [facility] ENDO SUITE;  Service: Endoscopy;  Laterality: N/A;  8:30 AM  . CORONARY ARTERY BYPASS GRAFT    . EYE SURGERY    . LEFT HEART CATH AND CORS/GRAFTS ANGIOGRAPHY N/A 10/07/2016   Procedure: Left Heart Cath and Cors/Grafts Angiography;  Surgeon: Troy Sine, MD;  Location: [place] CV LAB;  Service: Cardiovascular;  Laterality: N/A;  . PARTIAL LARYNGECTOMY  1998    Current Outpatient Medications  Medication Sig Dispense Refill  . amLODipine (NORVASC) 10 MG tablet Take 1 tablet (10 mg total) by mouth daily. 180 tablet 3  . aspirin 81 MG tablet Take 81 mg by mouth at  bedtime.     . bisacodyl (DULCOLAX) 5 MG EC tablet Take 5 mg by mouth daily as needed for moderate constipation.    Marland Kitchen ezetimibe (ZETIA) 10 MG tablet Take 1 tablet (10 mg total) by mouth daily. 90 tablet 3  . metoprolol tartrate (LOPRESSOR) 25 MG tablet Take 0.5 tablets (12.5 mg total) by mouth 2 (two) times daily. (Patient taking differently: Take 25 mg by mouth 2 (two) times daily. ) 180 tablet 3  . nitroGLYCERIN (NITROSTAT) 0.4 MG SL tablet Place 1 tablet (0.4 mg total) under the tongue every 5 (five) minutes as needed for chest pain. 25 tablet 3  . Omega-3 Fatty Acids (OMEGA-3 FISH OIL PO)  Take 1 capsule by mouth daily.    Marland Kitchen omeprazole (PRILOSEC) 20 MG capsule Take 20 mg every evening by mouth.     Marland Kitchen PARoxetine (PAXIL) 20 MG tablet Take 20 mg by mouth at bedtime.     Marland Kitchen spironolactone (ALDACTONE) 25 MG tablet Take 25 mg every evening by mouth.     No current facility-administered medications for this visit.     Allergies as of 09/15/2017 - Review Complete 09/15/2017  Allergen Reaction Noted  . Nifedipine Other (See Comments) 10/25/2016  . Erythromycin    . Lorazepam Other (See Comments)   . Penicillins Hives   . Sulfonamide derivatives Other (See Comments)   . Statins Itching and Rash 08/12/2011    Family History  Problem Relation Age of Onset  . Dementia Mother   . Heart disease Father   . Cancer Brother   . Heart disease Brother   . Hyperlipidemia Brother   . Cancer Brother   . Colon cancer Neg Hx     Social History   Socioeconomic History  . Marital status: Married    Spouse name: Not on file  . Number of children: Not on file  . Years of education: Not on file  . Highest education level: Not on file  Social Needs  . Financial resource strain: Not on file  . Food insecurity - worry: Not on file  . Food insecurity - inability: Not on file  . Transportation needs - medical: Not on file  . Transportation needs - non-medical: Not on file  Occupational History  . Occupation: Full time  Tobacco Use  . Smoking status: Former Smoker    Packs/day: 1.00    Years: 15.00    Pack years: 15.00    Types: Cigarettes    Start date: 04/22/1953    Last attempt to quit: 07/26/1970    Years since quitting: 47.1  . Smokeless tobacco: Former Systems developer    Types: [place] date: 05/05/2002  Substance and Sexual Activity  . Alcohol use: No    Alcohol/week: 0.0 oz  . Drug use: No  . Sexual activity: Not Currently  Other Topics Concern  . Not on file  Social History Narrative   Walks on the treadmill every other day at the [facility] for the last couple of weeks.           Review of Systems: Gen: Denies any fever, chills, fatigue, weight loss, lack of appetite.  CV: Denies chest pain, heart palpitations, peripheral edema, syncope.  Resp: +SOB, +DOE GI: Denies dysphagia or odynophagia. Denies jaundice, hematemesis, fecal incontinence. GU : Denies urinary burning, urinary frequency, urinary hesitancy MS: Denies joint pain, muscle weakness, cramps, or limitation of movement.  Derm: Denies rash, itching, dry skin Psych: Denies depression, anxiety, memory loss, and  confusion Heme: Denies bruising, bleeding, and enlarged lymph nodes.  Physical Exam: BP 137/82   Pulse 65   Temp 97.9 F (36.6 C) (Oral)   Ht 5\' 5"  (1.651 m)   Wt 181 lb 12.8 oz (82.5 kg)   BMI 30.25 kg/m  General:   Alert and oriented. Pleasant and cooperative. Well-nourished and well-developed. Chronic hoarseness, only able to whisper.  Head:  Normocephalic and atraumatic. Eyes:  Without icterus, sclera clear and conjunctiva pink.  Ears:  Normal auditory acuity. Nose:  No deformity, discharge,  or lesions. Mouth:  No deformity or lesions, oral mucosa pink.  Lungs:  Clear to auscultation bilaterally. No wheezes, rales, or rhonchi. No distress.  Heart:  S1, S2 present without murmurs appreciated.  Abdomen:  +BS, soft, non-tender and non-distended. No HSM noted. No guarding or rebound. No masses appreciated.  Rectal:  Deferred  Msk:  Symmetrical without gross deformities. Normal posture. Extremities:  Without edema. Neurologic:  Alert and  oriented x4 Psych:  Alert and cooperative. Normal mood and affect.  Lab Results  Component Value Date   HGB 14.3 06/08/2017

## 2017-09-15 NOTE — Progress Notes (Signed)
CC'ED TO PCP 

## 2017-09-27 ENCOUNTER — Telehealth: Payer: Self-pay | Admitting: Internal Medicine

## 2017-09-27 NOTE — Telephone Encounter (Signed)
Pt called asking for AB. I told him AB was seeing patients and I could take a message. Pt didn't want to tell me what it was in regards to other than it was about his last office visit. Please call him at [phone]

## 2017-09-28 ENCOUNTER — Telehealth: Payer: Self-pay | Admitting: Internal Medicine

## 2017-09-28 NOTE — Telephone Encounter (Signed)
Please call patient at [phone]. He called yesterday wanting to speak with AB, but does not want to give a message of what it;s in regards to other than it's about his last visit. Please is getting frustrated, but I told him that she was with patients at the moment.

## 2017-09-28 NOTE — Telephone Encounter (Signed)
Another Note was sent to provider.

## 2017-09-28 NOTE — Telephone Encounter (Signed)
Noted, samples are ready for pickup.  

## 2017-09-28 NOTE — Telephone Encounter (Signed)
Spoke with pt and he wanted to let AB know that Amitiza was taken twice daily and didn't help his constipation. Pt took 4 laxates the other day and didn't have a bowel movement until 24 hours later. Pt did get some relief from the bowel movement. Pts next apt is 10/28/17 and he would like to have a procedure done before April because he feels something is wrong. No abdomen pain, no voimtting .

## 2017-09-28 NOTE — Telephone Encounter (Signed)
Spoke with patient. No pain in abdomen. No N/V. Requesting CT. Recent one on file (without contrast) from Nov 2018 without concerning GI features. He has a BM only if he takes laxatives, but laxatives cause nausea. Amitiza unable to be tolerated due to nausea.  Alicia: please leave Linzess 290 mcg capsules at front desk for him.  In interim, I am checking with anesthesiology regarding his airway, as he has a complicated past medical history.

## 2017-10-04 DIAGNOSIS — J386 Stenosis of larynx: Secondary | ICD-10-CM | POA: Diagnosis not present

## 2017-10-04 DIAGNOSIS — Z8521 Personal history of malignant neoplasm of larynx: Secondary | ICD-10-CM | POA: Diagnosis not present

## 2017-10-24 DIAGNOSIS — I639 Cerebral infarction, unspecified: Secondary | ICD-10-CM

## 2017-10-24 HISTORY — DX: Cerebral infarction, unspecified: I63.9

## 2017-10-26 NOTE — Telephone Encounter (Signed)
I spoke with new anesthesia service. If procedures needed, they will evaluate patient during pre admission testing visit.

## 2017-10-28 ENCOUNTER — Ambulatory Visit: Payer: Medicare HMO | Admitting: Gastroenterology

## 2017-11-01 DIAGNOSIS — R69 Illness, unspecified: Secondary | ICD-10-CM | POA: Diagnosis not present

## 2017-11-02 DIAGNOSIS — L570 Actinic keratosis: Secondary | ICD-10-CM | POA: Diagnosis not present

## 2017-11-02 DIAGNOSIS — L821 Other seborrheic keratosis: Secondary | ICD-10-CM | POA: Diagnosis not present

## 2017-11-02 DIAGNOSIS — L578 Other skin changes due to chronic exposure to nonionizing radiation: Secondary | ICD-10-CM | POA: Diagnosis not present

## 2017-11-14 ENCOUNTER — Emergency Department (HOSPITAL_COMMUNITY): Payer: Medicare HMO

## 2017-11-14 ENCOUNTER — Other Ambulatory Visit: Payer: Self-pay

## 2017-11-14 ENCOUNTER — Inpatient Hospital Stay (HOSPITAL_COMMUNITY)
Admission: EM | Admit: 2017-11-14 | Discharge: 2017-11-16 | DRG: 066 | Disposition: A | Discharge status: Home / Self Care | Payer: Medicare HMO | Attending: Family Medicine | Admitting: Family Medicine

## 2017-11-14 ENCOUNTER — Encounter (HOSPITAL_COMMUNITY): Payer: Self-pay | Admitting: Emergency Medicine

## 2017-11-14 DIAGNOSIS — Z95828 Presence of other vascular implants and grafts: Secondary | ICD-10-CM

## 2017-11-14 DIAGNOSIS — Z9861 Coronary angioplasty status: Secondary | ICD-10-CM | POA: Diagnosis present

## 2017-11-14 DIAGNOSIS — I10 Essential (primary) hypertension: Secondary | ICD-10-CM | POA: Diagnosis present

## 2017-11-14 DIAGNOSIS — F419 Anxiety disorder, unspecified: Secondary | ICD-10-CM | POA: Diagnosis present

## 2017-11-14 DIAGNOSIS — R1310 Dysphagia, unspecified: Secondary | ICD-10-CM | POA: Diagnosis not present

## 2017-11-14 DIAGNOSIS — Z7982 Long term (current) use of aspirin: Secondary | ICD-10-CM | POA: Diagnosis not present

## 2017-11-14 DIAGNOSIS — I639 Cerebral infarction, unspecified: Secondary | ICD-10-CM | POA: Diagnosis not present

## 2017-11-14 DIAGNOSIS — Z87891 Personal history of nicotine dependence: Secondary | ICD-10-CM | POA: Diagnosis not present

## 2017-11-14 DIAGNOSIS — F329 Major depressive disorder, single episode, unspecified: Secondary | ICD-10-CM | POA: Diagnosis present

## 2017-11-14 DIAGNOSIS — E782 Mixed hyperlipidemia: Secondary | ICD-10-CM | POA: Diagnosis not present

## 2017-11-14 DIAGNOSIS — G459 Transient cerebral ischemic attack, unspecified: Secondary | ICD-10-CM | POA: Diagnosis not present

## 2017-11-14 DIAGNOSIS — Z888 Allergy status to other drugs, medicaments and biological substances status: Secondary | ICD-10-CM

## 2017-11-14 DIAGNOSIS — Z881 Allergy status to other antibiotic agents status: Secondary | ICD-10-CM

## 2017-11-14 DIAGNOSIS — I129 Hypertensive chronic kidney disease with stage 1 through stage 4 chronic kidney disease, or unspecified chronic kidney disease: Secondary | ICD-10-CM | POA: Diagnosis present

## 2017-11-14 DIAGNOSIS — R29701 NIHSS score 1: Secondary | ICD-10-CM | POA: Diagnosis present

## 2017-11-14 DIAGNOSIS — Z882 Allergy status to sulfonamides status: Secondary | ICD-10-CM

## 2017-11-14 DIAGNOSIS — N183 Chronic kidney disease, stage 3 (moderate): Secondary | ICD-10-CM | POA: Diagnosis present

## 2017-11-14 DIAGNOSIS — K219 Gastro-esophageal reflux disease without esophagitis: Secondary | ICD-10-CM | POA: Diagnosis present

## 2017-11-14 DIAGNOSIS — R203 Hyperesthesia: Secondary | ICD-10-CM | POA: Diagnosis not present

## 2017-11-14 DIAGNOSIS — I651 Occlusion and stenosis of basilar artery: Secondary | ICD-10-CM | POA: Diagnosis not present

## 2017-11-14 DIAGNOSIS — Z9221 Personal history of antineoplastic chemotherapy: Secondary | ICD-10-CM

## 2017-11-14 DIAGNOSIS — K5900 Constipation, unspecified: Secondary | ICD-10-CM | POA: Diagnosis not present

## 2017-11-14 DIAGNOSIS — Z79899 Other long term (current) drug therapy: Secondary | ICD-10-CM | POA: Diagnosis not present

## 2017-11-14 DIAGNOSIS — E781 Pure hyperglyceridemia: Secondary | ICD-10-CM | POA: Diagnosis present

## 2017-11-14 DIAGNOSIS — I6381 Other cerebral infarction due to occlusion or stenosis of small artery: Secondary | ICD-10-CM | POA: Diagnosis not present

## 2017-11-14 DIAGNOSIS — R531 Weakness: Secondary | ICD-10-CM | POA: Diagnosis not present

## 2017-11-14 DIAGNOSIS — I739 Peripheral vascular disease, unspecified: Secondary | ICD-10-CM | POA: Diagnosis present

## 2017-11-14 DIAGNOSIS — Z951 Presence of aortocoronary bypass graft: Secondary | ICD-10-CM

## 2017-11-14 DIAGNOSIS — I6523 Occlusion and stenosis of bilateral carotid arteries: Secondary | ICD-10-CM | POA: Diagnosis present

## 2017-11-14 DIAGNOSIS — Z8673 Personal history of transient ischemic attack (TIA), and cerebral infarction without residual deficits: Secondary | ICD-10-CM | POA: Diagnosis present

## 2017-11-14 DIAGNOSIS — Z8521 Personal history of malignant neoplasm of larynx: Secondary | ICD-10-CM | POA: Diagnosis not present

## 2017-11-14 DIAGNOSIS — I361 Nonrheumatic tricuspid (valve) insufficiency: Secondary | ICD-10-CM | POA: Diagnosis not present

## 2017-11-14 DIAGNOSIS — R4701 Aphasia: Secondary | ICD-10-CM | POA: Diagnosis not present

## 2017-11-14 DIAGNOSIS — I2510 Atherosclerotic heart disease of native coronary artery without angina pectoris: Secondary | ICD-10-CM | POA: Diagnosis present

## 2017-11-14 DIAGNOSIS — Z923 Personal history of irradiation: Secondary | ICD-10-CM

## 2017-11-14 DIAGNOSIS — E785 Hyperlipidemia, unspecified: Secondary | ICD-10-CM | POA: Diagnosis present

## 2017-11-14 DIAGNOSIS — R29810 Facial weakness: Secondary | ICD-10-CM | POA: Diagnosis present

## 2017-11-14 DIAGNOSIS — Z880 Allergy status to penicillin: Secondary | ICD-10-CM

## 2017-11-14 DIAGNOSIS — I4510 Unspecified right bundle-branch block: Secondary | ICD-10-CM | POA: Diagnosis not present

## 2017-11-14 DIAGNOSIS — R479 Unspecified speech disturbances: Secondary | ICD-10-CM | POA: Diagnosis present

## 2017-11-14 DIAGNOSIS — R200 Anesthesia of skin: Secondary | ICD-10-CM | POA: Diagnosis not present

## 2017-11-14 DIAGNOSIS — R29818 Other symptoms and signs involving the nervous system: Secondary | ICD-10-CM | POA: Diagnosis not present

## 2017-11-14 LAB — DIFFERENTIAL
Basophils Absolute: 0 10*3/uL (ref 0.0–0.1)
Basophils Relative: 0 %
Eosinophils Absolute: 0.1 10*3/uL (ref 0.0–0.7)
Eosinophils Relative: 2 %
Lymphocytes Relative: 18 %
Lymphs Abs: 1.7 10*3/uL (ref 0.7–4.0)
Monocytes Absolute: 1.2 10*3/uL — ABNORMAL HIGH (ref 0.1–1.0)
Monocytes Relative: 12 %
Neutro Abs: 6.6 10*3/uL (ref 1.7–7.7)
Neutrophils Relative %: 68 %

## 2017-11-14 LAB — TSH: TSH: 3.91 u[IU]/mL (ref 0.350–4.500)

## 2017-11-14 LAB — COMPREHENSIVE METABOLIC PANEL
ALT: 26 U/L (ref 17–63)
AST: 24 U/L (ref 15–41)
Albumin: 4.1 g/dL (ref 3.5–5.0)
Alkaline Phosphatase: 113 U/L (ref 38–126)
Anion gap: 11 (ref 5–15)
BUN: 19 mg/dL (ref 6–20)
CO2: 25 mmol/L (ref 22–32)
Calcium: 9.2 mg/dL (ref 8.9–10.3)
Chloride: 100 mmol/L — ABNORMAL LOW (ref 101–111)
Creatinine, Ser: 1.47 mg/dL — ABNORMAL HIGH (ref 0.61–1.24)
GFR calc Af Amer: 51 mL/min — ABNORMAL LOW (ref >60)
GFR calc non Af Amer: 44 mL/min — ABNORMAL LOW (ref >60)
Glucose, Bld: 102 mg/dL — ABNORMAL HIGH (ref 65–99)
Potassium: 4.1 mmol/L (ref 3.5–5.1)
Sodium: 136 mmol/L (ref 135–145)
Total Bilirubin: 0.6 mg/dL (ref 0.3–1.2)
Total Protein: 7.6 g/dL (ref 6.5–8.1)

## 2017-11-14 LAB — I-STAT CHEM 8, ED
BUN: 19 mg/dL (ref 6–20)
Calcium, Ion: 1.18 mmol/L (ref 1.15–1.40)
Chloride: 101 mmol/L (ref 101–111)
Creatinine, Ser: 1.5 mg/dL — ABNORMAL HIGH (ref 0.61–1.24)
Glucose, Bld: 98 mg/dL (ref 65–99)
HCT: 42 % (ref 39.0–52.0)
Hemoglobin: 14.3 g/dL (ref 13.0–17.0)
Potassium: 4.3 mmol/L (ref 3.5–5.1)
Sodium: 139 mmol/L (ref 135–145)
TCO2: 26 mmol/L (ref 22–32)

## 2017-11-14 LAB — PROTIME-INR
INR: 0.95
Prothrombin Time: 12.6 seconds (ref 11.4–15.2)

## 2017-11-14 LAB — CBG MONITORING, ED: Glucose-Capillary: 149 mg/dL — ABNORMAL HIGH (ref 65–99)

## 2017-11-14 LAB — CBC
HCT: 40.8 % (ref 39.0–52.0)
Hemoglobin: 13.7 g/dL (ref 13.0–17.0)
MCH: 32 pg (ref 26.0–34.0)
MCHC: 33.6 g/dL (ref 30.0–36.0)
MCV: 95.3 fL (ref 78.0–100.0)
Platelets: 241 10*3/uL (ref 150–400)
RBC: 4.28 MIL/uL (ref 4.22–5.81)
RDW: 13.3 % (ref 11.5–15.5)
WBC: 9.6 10*3/uL (ref 4.0–10.5)

## 2017-11-14 LAB — I-STAT TROPONIN, ED: Troponin i, poc: 0.01 ng/mL (ref 0.00–0.08)

## 2017-11-14 LAB — APTT: aPTT: 27 seconds (ref 24–36)

## 2017-11-14 MED ORDER — EZETIMIBE 10 MG PO TABS
10.0000 mg | ORAL_TABLET | Freq: Every day | ORAL | Status: DC
  Administered 2017-11-15 – 2017-11-16 (×2): 10 mg via ORAL
  Filled 2017-11-14 (×3): qty 1

## 2017-11-14 MED ORDER — TRAZODONE HCL 50 MG PO TABS
50.0000 mg | ORAL_TABLET | Freq: Every evening | ORAL | Status: DC | PRN
  Administered 2017-11-15: 50 mg via ORAL
  Filled 2017-11-14: qty 1

## 2017-11-14 MED ORDER — ACETAMINOPHEN 650 MG RE SUPP: 650.0000 mg | Freq: Four times a day (QID) | RECTAL | Status: DC | PRN

## 2017-11-14 MED ORDER — ALBUTEROL SULFATE (2.5 MG/3ML) 0.083% IN NEBU: 2.5000 mg | INHALATION_SOLUTION | RESPIRATORY_TRACT | Status: DC | PRN

## 2017-11-14 MED ORDER — SODIUM CHLORIDE 0.9% FLUSH: 3.0000 mL | INTRAVENOUS | Status: DC | PRN

## 2017-11-14 MED ORDER — BISACODYL 5 MG PO TBEC: 5.0000 mg | DELAYED_RELEASE_TABLET | Freq: Every day | ORAL | Status: DC | PRN

## 2017-11-14 MED ORDER — ASPIRIN 81 MG PO CHEW
81.0000 mg | CHEWABLE_TABLET | Freq: Every day | ORAL | Status: DC
  Administered 2017-11-15: 81 mg via ORAL
  Filled 2017-11-14: qty 1

## 2017-11-14 MED ORDER — POLYETHYLENE GLYCOL 3350 17 G PO PACK: 17.0000 g | PACK | Freq: Every day | ORAL | Status: DC | PRN

## 2017-11-14 MED ORDER — ONDANSETRON HCL 4 MG PO TABS: 4.0000 mg | ORAL_TABLET | Freq: Four times a day (QID) | ORAL | Status: DC | PRN

## 2017-11-14 MED ORDER — PANTOPRAZOLE SODIUM 40 MG PO TBEC
40.0000 mg | DELAYED_RELEASE_TABLET | Freq: Every day | ORAL | Status: DC
  Administered 2017-11-14 – 2017-11-16 (×3): 40 mg via ORAL
  Filled 2017-11-14 (×3): qty 1

## 2017-11-14 MED ORDER — AMLODIPINE BESYLATE 5 MG PO TABS
10.0000 mg | ORAL_TABLET | Freq: Every day | ORAL | Status: DC
  Filled 2017-11-14: qty 2

## 2017-11-14 MED ORDER — HYDRALAZINE HCL 20 MG/ML IJ SOLN: 10.0000 mg | Freq: Four times a day (QID) | INTRAMUSCULAR | Status: DC | PRN

## 2017-11-14 MED ORDER — ONDANSETRON HCL 4 MG/2ML IJ SOLN: 4.0000 mg | Freq: Four times a day (QID) | INTRAMUSCULAR | Status: DC | PRN

## 2017-11-14 MED ORDER — METOPROLOL TARTRATE 25 MG PO TABS
25.0000 mg | ORAL_TABLET | Freq: Two times a day (BID) | ORAL | Status: DC
  Administered 2017-11-15 – 2017-11-16 (×3): 25 mg via ORAL
  Filled 2017-11-14 (×4): qty 1

## 2017-11-14 MED ORDER — ASPIRIN 81 MG PO CHEW
81.0000 mg | CHEWABLE_TABLET | Freq: Every day | ORAL | Status: DC
  Administered 2017-11-14: 81 mg via ORAL
  Filled 2017-11-14: qty 1

## 2017-11-14 MED ORDER — SPIRONOLACTONE 25 MG PO TABS
25.0000 mg | ORAL_TABLET | Freq: Every evening | ORAL | Status: DC
  Filled 2017-11-14: qty 1

## 2017-11-14 MED ORDER — PAROXETINE HCL 20 MG PO TABS
20.0000 mg | ORAL_TABLET | Freq: Every day | ORAL | Status: DC
  Administered 2017-11-14 – 2017-11-15 (×2): 20 mg via ORAL
  Filled 2017-11-14 (×2): qty 1

## 2017-11-14 MED ORDER — ALTEPLASE 100 MG IV SOLR
INTRAVENOUS | Status: AC
  Filled 2017-11-14: qty 100

## 2017-11-14 MED ORDER — ASPIRIN 325 MG PO TABS
325.0000 mg | ORAL_TABLET | Freq: Once | ORAL | Status: AC
  Administered 2017-11-14: 325 mg via ORAL
  Filled 2017-11-14: qty 1

## 2017-11-14 MED ORDER — ACETAMINOPHEN 325 MG PO TABS: 650.0000 mg | ORAL_TABLET | Freq: Four times a day (QID) | ORAL | Status: DC | PRN

## 2017-11-14 MED ORDER — SODIUM CHLORIDE 0.9% FLUSH
3.0000 mL | Freq: Two times a day (BID) | INTRAVENOUS | Status: DC
  Administered 2017-11-14 – 2017-11-16 (×4): 3 mL via INTRAVENOUS

## 2017-11-14 MED ORDER — OMEGA-3-ACID ETHYL ESTERS 1 G PO CAPS
2.0000 | ORAL_CAPSULE | Freq: Two times a day (BID) | ORAL | Status: DC
  Administered 2017-11-15 (×2): 2 g via ORAL
  Filled 2017-11-14 (×4): qty 2

## 2017-11-14 MED ORDER — NITROGLYCERIN 0.4 MG SL SUBL: 0.4000 mg | SUBLINGUAL_TABLET | SUBLINGUAL | Status: DC | PRN

## 2017-11-14 MED ORDER — SODIUM CHLORIDE 0.9 % IV SOLN: 250.0000 mL | INTRAVENOUS | Status: DC | PRN

## 2017-11-14 MED ORDER — HEPARIN SODIUM (PORCINE) 5000 UNIT/ML IJ SOLN
5000.0000 [IU] | Freq: Three times a day (TID) | INTRAMUSCULAR | Status: DC
  Administered 2017-11-14 – 2017-11-16 (×5): 5000 [IU] via SUBCUTANEOUS
  Filled 2017-11-14 (×5): qty 1

## 2017-11-14 NOTE — ED Notes (Signed)
MD synder cancelled code stroke. Ordered no TPA.

## 2017-11-14 NOTE — ED Notes (Signed)
Tele neuro at bedside now

## 2017-11-14 NOTE — H&P (Signed)
Patient Demographics:    Kenneth [name], is a 77 y.o. male  MRN: [id number]   DOB - 04/15/1941  Admit Date - 11/14/2017  Outpatient Primary MD for the patient is [name], MD   Assessment & Plan:    Principal Problem:   TIA (transient ischemic attack) Active Problems:   HLD (hyperlipidemia)   CAD in native artery   Carotid stenosis, bilateral   Essential hypertension    1)Acute CVA Vs TIA -place on telemetry monitored unit, treat empirically with aspirin, patient apparently is intolerant to statins, MRI of the brain pending, echocardiogram to rule out intracardiac thrombus  and to evaluate EF is pending, carotid artery Dopplers to rule out hemodynamically significant stenosis pending.  neurology consult pending.Further Neuroimaging studies as recommended by Neurologist (consider MRA head and neck if MRI of the brain is positive for acute stroke) .  PT/OT eval pending, speech eval pending, We will allow some permissive hypertension in view of possible acute stroke, avoid precipitous drop in blood pressure. Use Hydralazine 10mg  or Labetalol 10 mg iv every 4 hrs as needed for systolic blood pressure over 200 mmhg , check TSH, A1c and fasting lipid profile,    2)H/o CAD/CABG/PAD--- patient is status post prior CABG and status post prior carotid artery stenting--- apparently is intolerant to statins, give Zetia and Lovaza, continue aspirin, await echo as above #1  3)H/o Laryngeal Ca-status post previous treatment including radiation, patient states he was told he may need tracheostomy down the road  4)CKD III-renal function appears to be close to baseline, creatinine is up to 1.4, avoid nephrotoxic agents, hold Aldactone until oral intake is better  With History of - Reviewed by me  Past Medical History:    Diagnosis Date  . Anxiety   . ASCVD (arteriosclerotic cardiovascular disease)   . Coronary artery disease    a. 2003: s/p CABG x3V  b. 2007: cath with patent bypass grafts.  c. 09/2016: [place] with cath showing patent bypass grafts, medical Rx recommended  . Depression   . GERD (gastroesophageal reflux disease)   . High triglycerides   . Hypertension   . Laryngeal carcinoma ([place])   . Nephrolithiasis   . Peripheral vascular disease ([place])   . Stroke ([place])   . Tobacco abuse   . Tracheal stenosis       Past Surgical History:  Procedure Laterality Date  . CARDIAC SURGERY    . CAROTID STENT Right 06-13-12  . CAROTID STENT INSERTION N/A 06/13/2012   Procedure: CAROTID STENT INSERTION;  Surgeon: Serafina Mitchell, MD;  Location: [facility] CATH LAB;  Service: Cardiovascular;  Laterality: N/A;  . CHOLECYSTECTOMY  2007   Gall Bladder  . COLONOSCOPY  11/10/2011   Dr. [name]: hemorrhoids, tubular adenoma  . CORONARY ARTERY BYPASS GRAFT    . EYE SURGERY    . LEFT HEART CATH AND CORS/GRAFTS ANGIOGRAPHY N/A 10/07/2016   Procedure: Left Heart Cath and Cors/Grafts Angiography;  Surgeon: Troy Sine, MD;  Location: [place] CV LAB;  Service: Cardiovascular;  Laterality: N/A;  . [place]    Chief Complaint  Patient presents with  . Numbness      HPI:    Kenneth [name]  is a 77 y.o. male with past medical history relevant for CAD, history of prior TIAs, history of laryngeal cancer, hypertension who presents with concerns about left hand numbness especially the second and third fingers while he was out in a grocery store shopping, it was followed by numbness on left side of his face, patient also noticed that when he was eating food was falling out from the left side of his mouth, as per family members patient speech was different and patient had left-sided headache so was sent to the ED for possible stroke evaluation.  Patient symptoms started around 2:30 PM on 11/14/2017  No syncope,  no chest pain palpitations dizziness, no pleuritic symptoms no leg pains or leg swelling  Patient son at bedside, questions answered  In the ED CT head without acute findings, left hand and left facial numbness improved but did not resolve    Review of systems:    In addition to the HPI above,   A full 12 point Review of 10 Systems was done, except as stated above, all other Review of 10 Systems were negative.   Social History:  Reviewed by me    Social History   Tobacco Use  . Smoking status: Former Smoker    Packs/day: 1.00    Years: 15.00    Pack years: 15.00    Types: Cigarettes    Start date: 04/22/1953    Last attempt to quit: 07/26/1970    Years since quitting: 47.3  . Smokeless tobacco: Former Systems developer    Types: Chew    Quit date: 05/05/2002  Substance Use Topics  . Alcohol use: No    Alcohol/week: 0.0 oz     Family History :  Reviewed by me    Family History  Problem Relation Age of Onset  . Dementia Mother   . Heart disease Father   . Cancer Brother   . Heart disease Brother   . Hyperlipidemia Brother   . Cancer Brother   . Colon cancer Neg Hx      Home Medications:   Prior to Admission medications   Medication Sig Start Date End Date Taking? Authorizing Provider  amLODipine (NORVASC) 10 MG tablet Take 1 tablet (10 mg total) by mouth daily. 08/26/17 11/24/17  Arnoldo Lenis, MD  aspirin 81 MG tablet Take 81 mg by mouth at bedtime.     [provider]  bisacodyl (DULCOLAX) 5 MG EC tablet Take 5 mg by mouth daily as needed for moderate constipation.    [provider]  ezetimibe (ZETIA) 10 MG tablet Take 1 tablet (10 mg total) by mouth daily. 09/07/17 12/06/17  Arnoldo Lenis, MD  metoprolol tartrate (LOPRESSOR) 25 MG tablet Take 0.5 tablets (12.5 mg total) by mouth 2 (two) times daily. Patient taking differently: Take 25 mg by mouth 2 (two) times daily.  08/26/17   Arnoldo Lenis, MD  nitroGLYCERIN (NITROSTAT) 0.4 MG SL tablet Place  1 tablet (0.4 mg total) under the tongue every 5 (five) minutes as needed for chest pain. 01/31/17 05/25/21  Lendon Colonel, NP  Omega-3 Fatty Acids (OMEGA-3 FISH OIL PO) Take 1 capsule by mouth daily.    [provider]  omeprazole ([place])  20 MG capsule Take 20 mg every evening by mouth.     [provider]  PARoxetine (PAXIL) 20 MG tablet Take 20 mg by mouth at bedtime.     [provider]  spironolactone (ALDACTONE) 25 MG tablet Take 25 mg every evening by mouth.    [provider]     Allergies:     Allergies  Allergen Reactions  . Nifedipine Other (See Comments)  . Erythromycin   . Lorazepam Other (See Comments)    REACTION: Alters mental status. "Turns into maniac"  . Penicillins Hives    Has patient had a PCN reaction causing immediate rash, facial/tongue/throat swelling, SOB or lightheadedness with hypotension: Yes Has patient had a PCN reaction causing severe rash involving mucus membranes or skin necrosis: No Has patient had a PCN reaction that required hospitalization No Has patient had a PCN reaction occurring within the last 10 years: No If all of the above answers are "NO", then may proceed with Cephalosporin use.   . Sulfonamide Derivatives Other (See Comments)    REACTION: Unknown to patient. States that MD states allergy per medical records  . Statins Itching and Rash     Physical Exam:   Vitals  Blood pressure (!) 147/76, pulse 64, temperature 98.3 F (36.8 C), temperature source Axillary, resp. rate (!) 21, height 5\' 8"  (1.727 m), weight 82.1 kg (181 lb), SpO2 98 %.  Physical Examination: General appearance - alert, well appearing, and in no distress Mental status - alert, oriented to person, place, and time, Eyes - sclera anicteric Neck - supple, no JVD elevation , neck scar from prior laryngeal surgery  Chest - clear  to auscultation bilaterally, symmetrical air movement,  Heart - S1 and S2 normal, CABG  scar Abdomen - soft, nontender, nondistended, no masses or organomegaly Neurological - screening mental status exam normal, neck supple without rigidity, mild left-sided facial sensory loss, improving speech difficulties, mild facial asymmetry, tongue or uvula midline, left hand numbness improved but not resolved Extremities - no pedal edema noted, intact peripheral pulses  Skin - warm, dry     Data Review:    CBC Recent Labs  Lab 11/14/17 1757 11/14/17 1810  WBC 9.6  --   HGB 13.7 14.3  HCT 40.8 42.0  PLT 241  --   MCV 95.3  --   MCH 32.0  --   MCHC 33.6  --   RDW 13.3  --   LYMPHSABS 1.7  --   MONOABS 1.2*  --   EOSABS 0.1  --   BASOSABS 0.0  --    ------------------------------------------------------------------------------------------------------------------  Chemistries  Recent Labs  Lab 11/14/17 1757 11/14/17 1810  NA 136 139  K 4.1 4.3  CL 100* 101  CO2 25  --   GLUCOSE 102* 98  BUN 19 19  CREATININE 1.47* 1.50*  CALCIUM 9.2  --   AST 24  --   ALT 26  --   ALKPHOS 113  --   BILITOT 0.6  --    ------------------------------------------------------------------------------------------------------------------ estimated creatinine clearance is 42.4 mL/min (A) (by C-G formula based on SCr of 1.5 mg/dL (H)). ------------------------------------------------------------------------------------------------------------------ Recent Labs    11/14/17 1924  TSH 3.910     Coagulation profile Recent Labs  Lab 11/14/17 1757  INR 0.95   ------------------------------------------------------------------------------------------------------------------- No results for input(s): DDIMER in the last 72 hours. -------------------------------------------------------------------------------------------------------------------  Cardiac Enzymes No results for input(s): CKMB, TROPONINI, MYOGLOBIN in the last 168 hours.  Invalid input(s):  CK ------------------------------------------------------------------------------------------------------------------  Component Value Date/Time   BNP 52.0 10/05/2016 1832     ---------------------------------------------------------------------------------------------------------------  Urinalysis    Component Value Date/Time   LABSPEC 1.018 06/01/2010 1113   PHURINE 6.5 06/01/2010 1113   GLUCOSEU neg 06/01/2010 1113   PROTEINUR neg 06/01/2010 1113   NITRITE neg 06/01/2010 1113    ----------------------------------------------------------------------------------------------------------------   Imaging Results:    Ct Head Code Stroke Wo Contrast  Result Date: 11/14/2017 CLINICAL DATA:  Code stroke.  LEFT-sided weakness. EXAM: CT HEAD WITHOUT CONTRAST TECHNIQUE: Contiguous axial images were obtained from the base of the skull through the vertex without intravenous contrast. COMPARISON:  06/08/2017. FINDINGS: Brain: No evidence for acute infarction, hemorrhage, mass lesion, hydrocephalus, or extra-axial fluid. Generalized atrophy. Extensive white matter disease. Vascular: Calcification of the cavernous internal carotid arteries consistent with cerebrovascular atherosclerotic disease. No signs of intracranial large vessel occlusion. Skull: Normal. Negative for fracture or focal lesion. Sinuses/Orbits: No acute finding. Other: None ASPECTS ([place] Stroke Program Early CT Score) - Ganglionic level infarction (caudate, lentiform nuclei, internal capsule, insula, M1-M3 cortex): 7 - Supraganglionic infarction (M4-M6 cortex): 3 Total score (0-10 with 10 being normal): 10 IMPRESSION: 1. Atrophy and small vessel disease similar to priors. No acute intracranial findings. 2. ASPECTS is 10. These results were called by telephone at the time of interpretation on 11/14/2017 at 6:09 pm to Dr. Aletta Edouard , who verbally acknowledged these results. Electronically Signed   By: Staci Righter M.D.   On:  11/14/2017 18:13    Radiological Exams on Admission: Ct Head Code Stroke Wo Contrast  Result Date: 11/14/2017 CLINICAL DATA:  Code stroke.  LEFT-sided weakness. EXAM: CT HEAD WITHOUT CONTRAST TECHNIQUE: Contiguous axial images were obtained from the base of the skull through the vertex without intravenous contrast. COMPARISON:  06/08/2017. FINDINGS: Brain: No evidence for acute infarction, hemorrhage, mass lesion, hydrocephalus, or extra-axial fluid. Generalized atrophy. Extensive white matter disease. Vascular: Calcification of the cavernous internal carotid arteries consistent with cerebrovascular atherosclerotic disease. No signs of intracranial large vessel occlusion. Skull: Normal. Negative for fracture or focal lesion. Sinuses/Orbits: No acute finding. Other: None ASPECTS ([place] Stroke Program Early CT Score) - Ganglionic level infarction (caudate, lentiform nuclei, internal capsule, insula, M1-M3 cortex): 7 - Supraganglionic infarction (M4-M6 cortex): 3 Total score (0-10 with 10 being normal): 10 IMPRESSION: 1. Atrophy and small vessel disease similar to priors. No acute intracranial findings. 2. ASPECTS is 10. These results were called by telephone at the time of interpretation on 11/14/2017 at 6:09 pm to Dr. Aletta Edouard , who verbally acknowledged these results. Electronically Signed   By: Staci Righter M.D.   On: 11/14/2017 18:13    DVT Prophylaxis -SCD /heparin AM Labs Ordered, also please review Full Orders  Family Communication: Admission, patients condition and plan of care including tests being ordered have been discussed with the patient and son who indicate understanding and agree with the plan   Code Status - Full Code  Likely DC to  home  Condition   stable  Roxan Hockey M.D on 11/14/2017 at 11:15 PM  Between 7am to 7pm - Pager - [phone] After 7pm go to [email or web address] - password [email or web address]  [facility] - Office  [phone]  Voice Recognition Viviann Spare  dictation system was used to create this note, attempts have been made to correct errors. Please contact the author with questions and/or clarifications.

## 2017-11-14 NOTE — Consult Note (Signed)
   [facility] Tele[facility] Consult Services  Impression: Minor ischemic stroke (lacune) vs. TIA.   -Not a tpa candidate due to:  NIHSS 1 (sensory only, non-disabling symptoms), symptoms improved. -Presentation is not suggestive of Large Vessel Occlusive Disease, therefore, thrombectomy would not be recommended.  Differential Diagnosis: 1. Cardioembolic stroke 2. Small vessel disease/lacune 3. Thromboembolic, artery-to-artery mechanism 4. Hypercoagulable state-related infarct 5. Transient ischemic attack 6. Thrombotic mechanism, large artery disease  Arrival Time:  17:42 [facility] contacted:  17:59 [facility] at bedside:  18:04 NIHSS assessment time:  18:17  Recommendations: =>  I would not recommend IV tPA in this case.  Give ASA. =>  Permissive HTN. =>  Admission for TIA/stroke evaluation.  Inpatient neurology consultation.  Inpatient stroke evaluation as per Neurology/ Internal Medicine.  Discussed with ED MD who was in agreement with plan. Please call with questions  Verdis Prime, MD [facility] -----------------------------------------------------------------------------------------  CC:  L sided numbness  History of Present Illness Patient is a 77 yo man with h/o laryngeal cancer, prior smoking, HTN, HLD, CAD s/p CABG, prior mild stroke, who today at 14:30 while shopping noted numbness of his L face/UE.  He could still move the hand normally, although perhaps it was slightly clumsy, but no significant weakness.  Questionable L facial droop noted by family, currently not present.  He has chronic hoarse voice, which at present is at baseline, although family also questioned earlier whether he had slight slurring of speech.  No word-finding problems.  No other symptoms.  Symptoms have improved, but still some vague, slight numbness over L face and UE.  Diagnostic: Head CT shows advanced chronic small vessel disease and prior chronic small white matter  lacunes, but no acute pathology. Labs pending.  Exam: 159/93. Patient is in no apparent distress. Patient appears as stated age. No obvious acute respiratory or cardiac distress. Patient is well groomed and well-nourished.  A/Ox.3  Voice is very hoarse but at baseline per patient.  Intact naming, repetition, comprehension, fluency.  CN II-XII intact except for slight subjective diminution of sensation over L face compared to R.  Motor no drift x 4, symmetric fine finger movements.  No ataxia.  Sensory slight subjective diminished sensation over L UE compared to R, not L LE.   NIHSS score: 1  Medical Decision Making: - Extensive number of diagnosis or management options are considered above. - Extensive amount of complex data reviewed. - High risk of complication and/or morbidity or mortality are associated with differential diagnostic considerations above. - There may be uncertain outcome and increased probability of prolonged functional impairment or high probability of severe prolonged functional impairment associated with some of these differential diagnosis.  Medical Data Reviewed: 1.  Data reviewed include clinical labs, radiology,Medical Tests; 2.  Tests results discussed w/performing or interpreting physician; 3.  Obtaining/reviewing old medical records; 4.  Obtaining case history from another source; 5.  Independent review of image, tracing or specimen.  Patient was informed the Neurology Consult would

## 2017-11-14 NOTE — ED Triage Notes (Signed)
Pt reports about 1430 this afternoon while at the grocery store his left hand and arm became numb.  States he was on the way home and his left face was numb over to his nose.  Mild weakness and pronator drift to left arm.

## 2017-11-15 ENCOUNTER — Observation Stay (HOSPITAL_COMMUNITY): Payer: Medicare HMO

## 2017-11-15 ENCOUNTER — Observation Stay (HOSPITAL_BASED_OUTPATIENT_CLINIC_OR_DEPARTMENT_OTHER): Payer: Medicare HMO

## 2017-11-15 DIAGNOSIS — Z8673 Personal history of transient ischemic attack (TIA), and cerebral infarction without residual deficits: Secondary | ICD-10-CM | POA: Diagnosis present

## 2017-11-15 DIAGNOSIS — I361 Nonrheumatic tricuspid (valve) insufficiency: Secondary | ICD-10-CM

## 2017-11-15 DIAGNOSIS — I639 Cerebral infarction, unspecified: Secondary | ICD-10-CM | POA: Diagnosis present

## 2017-11-15 DIAGNOSIS — R200 Anesthesia of skin: Secondary | ICD-10-CM | POA: Diagnosis not present

## 2017-11-15 DIAGNOSIS — I6523 Occlusion and stenosis of bilateral carotid arteries: Secondary | ICD-10-CM | POA: Diagnosis not present

## 2017-11-15 LAB — CBC
HCT: 38 % — ABNORMAL LOW (ref 39.0–52.0)
Hemoglobin: 12.5 g/dL — ABNORMAL LOW (ref 13.0–17.0)
MCH: 31.7 pg (ref 26.0–34.0)
MCHC: 32.9 g/dL (ref 30.0–36.0)
MCV: 96.4 fL (ref 78.0–100.0)
Platelets: 196 10*3/uL (ref 150–400)
RBC: 3.94 MIL/uL — ABNORMAL LOW (ref 4.22–5.81)
RDW: 13.3 % (ref 11.5–15.5)
WBC: 7.8 10*3/uL (ref 4.0–10.5)

## 2017-11-15 LAB — LIPID PANEL
Cholesterol: 185 mg/dL (ref 0–200)
HDL: 29 mg/dL — ABNORMAL LOW (ref >40)
LDL Cholesterol: UNDETERMINED mg/dL (ref 0–99)
Total CHOL/HDL Ratio: 6.4 RATIO
Triglycerides: 439 mg/dL — ABNORMAL HIGH (ref <150)
VLDL: UNDETERMINED mg/dL (ref 0–40)

## 2017-11-15 LAB — ECHOCARDIOGRAM COMPLETE
Height: 68 in
Weight: 2896 oz

## 2017-11-15 LAB — BASIC METABOLIC PANEL
Anion gap: 9 (ref 5–15)
BUN: 21 mg/dL — ABNORMAL HIGH (ref 6–20)
CO2: 27 mmol/L (ref 22–32)
Calcium: 9 mg/dL (ref 8.9–10.3)
Chloride: 102 mmol/L (ref 101–111)
Creatinine, Ser: 1.22 mg/dL (ref 0.61–1.24)
GFR calc Af Amer: >60 mL/min (ref >60)
GFR calc non Af Amer: 55 mL/min — ABNORMAL LOW (ref >60)
Glucose, Bld: 116 mg/dL — ABNORMAL HIGH (ref 65–99)
Potassium: 4 mmol/L (ref 3.5–5.1)
Sodium: 138 mmol/L (ref 135–145)

## 2017-11-15 LAB — HEMOGLOBIN A1C
Hgb A1c MFr Bld: 5.9 % — ABNORMAL HIGH (ref 4.8–5.6)
Mean Plasma Glucose: 122.63 mg/dL

## 2017-11-15 MED ORDER — CLOPIDOGREL BISULFATE 75 MG PO TABS
75.0000 mg | ORAL_TABLET | Freq: Every day | ORAL | Status: DC
  Administered 2017-11-16: 75 mg via ORAL
  Filled 2017-11-15: qty 1

## 2017-11-15 MED ORDER — ASPIRIN 325 MG PO TABS
325.0000 mg | ORAL_TABLET | Freq: Every day | ORAL | Status: DC
  Administered 2017-11-15 – 2017-11-16 (×2): 325 mg via ORAL
  Filled 2017-11-15 (×2): qty 1

## 2017-11-15 NOTE — Progress Notes (Signed)
*  PRELIMINARY RESULTS* Echocardiogram 2D Echocardiogram has been performed.  Samuel Germany 11/15/2017, 11:28 AM

## 2017-11-15 NOTE — Care Management Obs Status (Signed)
[facility] NOTIFICATION   Patient Details  Name: KAIYU MIRABAL MRN: [id number] Date of Birth: 25-Dec-1940   Medicare Observation Status Notification Given:  Other (see comment)(pt/wife refuse to sign)    Sherald Barge, RN 11/15/2017, 9:03 AM

## 2017-11-15 NOTE — Progress Notes (Signed)
3 neuro checks have been completed Q4hr as ordered. Will continue to monitor patient.

## 2017-11-15 NOTE — Evaluation (Signed)
Physical Therapy Evaluation Patient Details Name: Kenneth [name] MRN: [id number] DOB: 1941-03-20 Today's Date: 11/15/2017   History of Present Illness  Kenneth [name]  is a 77 y.o. male with past medical history relevant for CAD, history of prior TIAs, history of laryngeal cancer, hypertension who presents with concerns about left hand numbness especially the second and third fingers while he was out in a grocery store shopping, it was followed by numbness on left side of his face, patient also noticed that when he was eating food was falling out from the left side of his mouth, as per family members patient speech was different and patient had left-sided headache so was sent to the ED for possible stroke evaluation.  Patient symptoms started around 2:30 PM on 11/14/2017. MRI positive for acute infarcts    Clinical Impression  Patient functioning at baseline for functional mobility and gait.  Patient only c/o numbness left side of face, numbness in left hand has resolved, no physical deficits noted.  Plan:  Patient discharged from physical therapy to care of nursing for ambulation daily as tolerated for length of stay.     Follow Up Recommendations No PT follow up    Equipment Recommendations  None recommended by PT    Recommendations for Other Services       Precautions / Restrictions Precautions Precautions: None Restrictions Weight Bearing Restrictions: No      Mobility  Bed Mobility Overal bed mobility: Modified Independent                Transfers Overall transfer level: Modified independent                  Ambulation/Gait Ambulation/Gait assistance: Modified independent (Device/Increase time) Ambulation Distance (Feet): 150 Feet Assistive device: None Gait Pattern/deviations: WFL(Within Functional Limits)   Gait velocity interpretation: 1.31 - 2.62 ft/sec, indicative of limited community ambulator General Gait Details: able to ambulate in hallways  without loss of balance   Stairs            Wheelchair Mobility    Modified Rankin (Stroke Patients Only)       Balance Overall balance assessment: No apparent balance deficits (not formally assessed)                                           Pertinent Vitals/Pain Pain Assessment: No/denies pain    Home Living Family/patient expects to be discharged to:: Private residence Living Arrangements: Spouse/significant other;Children Available Help at Discharge: Family;Available 24 hours/day Type of Home: House Home Access: Stairs to enter Entrance Stairs-Rails: None Entrance Stairs-Number of Steps: 1 Home Layout: One level Home Equipment: Walker - 2 wheels;Cane - single point;Bedside commode;Shower seat;Grab bars - tub/shower      Prior Function Level of Independence: Independent               Hand Dominance   Dominant Hand: Right    Extremity/Trunk Assessment   Upper Extremity Assessment Upper Extremity Assessment: Defer to OT evaluation    Lower Extremity Assessment Lower Extremity Assessment: Overall WFL for tasks assessed    Cervical / Trunk Assessment Cervical / Trunk Assessment: Normal  Communication   Communication: No difficulties  Cognition Arousal/Alertness: Awake/alert Behavior During Therapy: WFL for tasks assessed/performed Overall Cognitive Status: Within Functional Limits for tasks assessed  General Comments      Exercises     Assessment/Plan    PT Assessment Patent does not need any further PT services  PT Problem List         PT Treatment Interventions      PT Goals (Current goals can be found in the Care Plan section)  Acute Rehab PT Goals Patient Stated Goal: return home PT Goal Formulation: With patient/family Time For Goal Achievement: Dec 10, 2017 Potential to Achieve Goals: Good    Frequency     Barriers to discharge        Co-evaluation  PT/OT/SLP Co-Evaluation/Treatment: Yes Reason for Co-Treatment: Complexity of the patient's impairments (multi-system involvement) PT goals addressed during session: Mobility/safety with mobility;Balance OT goals addressed during session: ADL's and self-care       AM-PAC PT "6 Clicks" Daily Activity  Outcome Measure Difficulty turning over in bed (including adjusting bedclothes, sheets and blankets)?: None Difficulty moving from lying on back to sitting on the side of the bed? : None Difficulty sitting down on and standing up from a chair with arms (e.g., wheelchair, bedside commode, etc,.)?: None Help needed moving to and from a bed to chair (including a wheelchair)?: None Help needed walking in hospital room?: None Help needed climbing 3-5 steps with a railing? : None 6 Click Score: 24    End of Session   Activity Tolerance: Patient tolerated treatment well Patient left: in bed;with call bell/phone within reach;with family/visitor present(seated at bedside) Nurse Communication: Mobility status PT Visit Diagnosis: Unsteadiness on feet (R26.81);Other abnormalities of gait and mobility (R26.89);Muscle weakness (generalized) (M62.81)    Time: 7017-7939 PT Time Calculation (min) (ACUTE ONLY): 21 min   Charges:   PT Evaluation $PT Eval Low Complexity: 1 Low PT Treatments $Therapeutic Activity: 8-22 mins   PT G Codes:        9:04 AM, 12-10-2017 Lonell Grandchild, MPT Physical Therapist with [facility] [phone] office [phone] mobile phone

## 2017-11-15 NOTE — ED Provider Notes (Signed)
[facility] Provider Note   CSN: [id number] Arrival date & time: 11/14/17  1742     History   Chief Complaint Chief Complaint  Patient presents with  . Numbness    HPI Kenneth [name] is a 77 y.o. male.  Patient presents from home after experiencing left hand numbness starting about 2:30 PM.  While at home he noticed he was having difficulty eating and food was falling out of the left side of his mouth.  His family appreciated some left-sided facial droop and some change in his voice.  Patient also admits to some left-sided face numbness.  The symptoms are improving on arrival to the emergency department.  He states he had a prior mini stroke years ago that involved some numbness in his left hand.  He was left with no residual symptoms.  There is a mild headache.  There are no associated visual symptoms no nausea no vomiting no chest pain no shortness of breath no vomiting or diarrhea no recent illnesses no change in medications.  The history is provided by the patient.  Cerebrovascular Accident  This is a new problem. The current episode started 3 to 5 hours ago. The problem occurs constantly. The problem has been gradually improving. Associated symptoms include headaches. Pertinent negatives include no chest pain, no abdominal pain and no shortness of breath. Nothing aggravates the symptoms. Nothing relieves the symptoms. He has tried nothing for the symptoms. The treatment provided no relief.    Past Medical History:  Diagnosis Date  . Anxiety   . ASCVD (arteriosclerotic cardiovascular disease)   . Coronary artery disease    a. 2003: s/p CABG x3V  b. 2007: cath with patent bypass grafts.  c. 09/2016: [place] with cath showing patent bypass grafts, medical Rx recommended  . Depression   . GERD (gastroesophageal reflux disease)   . High triglycerides   . Hypertension   . 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 Nephrolithiasis   . Peripheral vascular disease ([place])   .  Stroke ([place])   . Tobacco abuse   . Tracheal stenosis     Patient Active Problem List   Diagnosis Date Noted  . TIA (transient ischemic attack) 11/14/2017  . Constipation 09/15/2017  . Chest pain 10/06/2016  . Essential hypertension 10/06/2016  . 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 Dyspnea 12/05/2015  . 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 Aftercare following surgery of the circulatory system, [place] 08/03/2013  . Occlusion and stenosis of carotid artery without mention of cerebral infarction 07/21/2012  . Preop cardiovascular exam 05/08/2012  . Carotid stenosis, bilateral 05/05/2012  . CAD in native artery 08/13/2011  . 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 HLD (hyperlipidemia) 12/14/2009  . CEREBROVASCULAR ACCIDENT 12/14/2009  . GASTROESOPHAGEAL REFLUX DISEASE 12/14/2009  . 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 CARDIAC SURGERY    . CAROTID STENT Right 06-13-12  . CAROTID STENT INSERTION N/A 06/13/2012   Procedure: CAROTID STENT INSERTION;  Surgeon: Serafina Mitchell, MD;  Location: [facility];  Service: Cardiovascular;  Laterality: N/A;  . CHOLECYSTECTOMY  2007   Gall Bladder  . COLONOSCOPY  11/10/2011   Dr. [name]: hemorrhoids, tubular adenoma  . CORONARY ARTERY BYPASS GRAFT    . EYE SURGERY    . LEFT HEART CATH AND CORS/GRAFTS ANGIOGRAPHY N/A 10/07/2016   Procedure: Left Heart Cath and Cors/Grafts Angiography;  Surgeon: Troy Sine, MD;  Location: [facility];  Service: Cardiovascular;  Laterality: N/A;  . PARTIAL LARYNGECTOMY  1998        Home Medications    Prior to Admission medications   Medication Sig Start Date End Date Taking? Authorizing Provider  bisacodyl (DULCOLAX) 5 MG EC tablet Take 5 mg by mouth daily as needed for moderate constipation.   Yes [provider]  ezetimibe (ZETIA) 10 MG tablet Take 1 tablet (10 mg total) by mouth daily. 09/07/17 12/06/17 Yes Branch, Alphonse Guild, MD  nitroGLYCERIN  (NITROSTAT) 0.4 MG SL tablet Place 1 tablet (0.4 mg total) under the tongue every 5 (five) minutes as needed for chest pain. 01/31/17 05/25/21 Yes Lendon Colonel, NP  Omega-3 Fatty Acids (OMEGA-3 FISH OIL PO) Take 1 capsule by mouth daily.   Yes [provider]  amLODipine (NORVASC) 10 MG tablet Take 1 tablet (10 mg total) by mouth daily. 08/26/17 11/24/17  Arnoldo Lenis, MD  aspirin 81 MG tablet Take 81 mg by mouth at bedtime.     [provider]  metoprolol tartrate (LOPRESSOR) 25 MG tablet Take 0.5 tablets (12.5 mg total) by mouth 2 (two) times daily. 08/26/17   Arnoldo Lenis, MD  omeprazole (PRILOSEC) 20 MG capsule Take 20 mg every evening by mouth.     [provider]  PARoxetine (PAXIL) 20 MG tablet Take 20 mg by mouth at bedtime.     [provider]  spironolactone (ALDACTONE) 25 MG tablet Take 25 mg every evening by mouth.    [provider]    Family History Family History  Problem Relation Age of Onset  . Dementia Mother   . Heart disease Father   . Cancer Brother   . Heart disease Brother   . Hyperlipidemia Brother   . Cancer Brother   . Colon cancer Neg Hx     Social History Social History   Tobacco Use  . Smoking status: Former Smoker    Packs/day: 1.00    Years: 15.00    Pack years: 15.00    Types: Cigarettes    Start date: 04/22/1953    Last attempt to quit: 07/26/1970    Years since quitting: 47.3  . Smokeless tobacco: Former Systems developer    Types: Chew    Quit date: 05/05/2002  Substance Use Topics  . Alcohol use: No    Alcohol/week: 0.0 oz  . Drug use: No     Allergies   Nifedipine; Erythromycin; Lorazepam; Penicillins; Sulfonamide derivatives; and Statins   Review of Systems Review of Systems  Constitutional: Negative for chills and fever.  HENT: Negative for ear pain and sore throat.   Eyes: Negative for pain and visual disturbance.  Respiratory: Negative for cough and shortness of breath.     Cardiovascular: Negative for chest pain and palpitations.  Gastrointestinal: Negative for abdominal pain and vomiting.  Genitourinary: Negative for dysuria and hematuria.  Musculoskeletal: Negative for arthralgias and back pain.  Skin: Negative for color change and rash.  Neurological: Positive for speech difficulty, weakness, numbness and headaches. Negative for seizures and syncope.  All other systems reviewed and are negative.    Physical Exam Updated Vital Signs BP (!) 127/59 (BP Location: Left Arm)   Pulse 64   Temp 98.1 F (36.7 C) (Oral)   Resp (!) 21   Ht 5\' 8"  (1.727 m)   Wt 82.1 kg (181 lb)   SpO2 97%   BMI 27.52 kg/m   Physical Exam  Constitutional: He is oriented to person, place,  and time. He appears well-developed and well-nourished.  HENT:  Head: Normocephalic and atraumatic.  Eyes: Conjunctivae are normal.  Neck: Neck supple.  Cardiovascular: Normal rate and regular rhythm.  No murmur heard. Pulmonary/Chest: Effort normal and breath sounds normal. No respiratory distress.  Abdominal: Soft. There is no tenderness.  Musculoskeletal: He exhibits no edema.  Neurological: He is alert and oriented to person, place, and time. He has normal strength. A sensory deficit is present. No cranial nerve deficit. GCS eye subscore is 4. GCS verbal subscore is 5. GCS motor subscore is 6.  On exam he is got some subjective decreased sensation on his left cheek and his left hand.  There is a slight pronator drift of his left arm.  Skin: Skin is warm and dry.  Psychiatric: He has a normal mood and affect.  Nursing note and vitals reviewed.    ED Treatments / Results  Labs (all labs ordered are listed, but only abnormal results are displayed) Labs Reviewed  DIFFERENTIAL - Abnormal; Notable for the following components:      Result Value   Monocytes Absolute 1.2 (*)    All other components within normal limits  COMPREHENSIVE METABOLIC PANEL - Abnormal; Notable for the  following components:   Chloride 100 (*)    Glucose, Bld 102 (*)    Creatinine, Ser 1.47 (*)    GFR calc non Af Amer 44 (*)    GFR calc Af Amer 51 (*)    All other components within normal limits  BASIC METABOLIC PANEL - Abnormal; Notable for the following components:   Glucose, Bld 116 (*)    BUN 21 (*)    GFR calc non Af Amer 55 (*)    All other components within normal limits  CBC - Abnormal; Notable for the following components:   RBC 3.94 (*)    Hemoglobin 12.5 (*)    HCT 38.0 (*)    All other components within normal limits  LIPID PANEL - Abnormal; Notable for the following components:   Triglycerides 439 (*)    HDL 29 (*)    All other components within normal limits  CBG MONITORING, ED - Abnormal; Notable for the following components:   Glucose-Capillary 149 (*)    All other components within normal limits  I-STAT CHEM 8, ED - Abnormal; Notable for the following components:   Creatinine, Ser 1.50 (*)    All other components within normal limits  PROTIME-INR  APTT  CBC  TSH  HEMOGLOBIN A1C  I-STAT TROPONIN, ED    EKG EKG Interpretation  Date/Time:  Monday November 14 2017 18:17:18 EDT Ventricular Rate:  70 PR Interval:    QRS Duration: 120 QT Interval:  395 QTC Calculation: 427 R Axis:   28 Text Interpretation:  Sinus rhythm Atrial premature complex IVCD, consider atypical RBBB similar to prior 3/18 Confirmed by Aletta Edouard [phone]) on 11/14/2017 6:28:23 PM   Radiology Mr Jodene Nam Head Wo Contrast  Result Date: 11/15/2017 CLINICAL DATA:  Numbness involving the left hand and face. Speech disturbance. EXAM: MRI HEAD WITHOUT CONTRAST MRA HEAD WITHOUT CONTRAST TECHNIQUE: Multiplanar, multiecho pulse sequences of the brain and surrounding structures were obtained without intravenous contrast. Angiographic images of the head were obtained using MRA technique without contrast. COMPARISON:  Head CT 11/14/2017, MRI 07/18/2015, and MRA 12/15/2006 FINDINGS: MRI HEAD FINDINGS  Brain: Several small acute cortically based infarcts are present in the right parietal lobe and perirolandic region without associated hemorrhage. Small chronic infarcts in the right  parietal cortex and right cerebellum are unchanged from 2016. Patchy and confluent T2 hyperintensities throughout the cerebral white matter and pons are similar to the prior MRI and nonspecific but compatible with extensive chronic small vessel ischemic disease. There is mild-to-moderate generalized cerebral atrophy. No mass, midline shift, or extra-axial fluid collection is seen. Vascular: Major intracranial vascular flow voids are preserved. Skull and upper cervical spine: Unremarkable bone marrow signal. Sinuses/Orbits: Bilateral cataract extraction. Paranasal sinuses and mastoid air cells are clear. Other: None. MRA HEAD FINDINGS The visualized distal vertebral arteries are patent to the basilar with the left being dominant. There is a severe mid right V4 stenosis, and there is a moderate proximal left V4 stenosis. PICAs and SCAs are patent bilaterally. The basilar artery is widely patent. There is a fetal origin of the right PCA, and there is also a small left posterior communicating artery. A moderate proximal left P2 stenosis is noted. There is chronic asymmetric attenuation of the distal right PCA. The internal carotid arteries are patent from skull base to carotid termini with evidence of mild anterior genu and proximal supraclinoid stenosis on the right. The MCAs are patent without evidence of proximal branch occlusion or M1 stenosis. Nonvisualization of the right A1 segment reflects interval occlusion from 2008. The anterior communicating artery is patent and supplies the right A2 segment from the left. The left A1 segment is widely patent. No aneurysm is identified. IMPRESSION: 1. Small acute right frontoparietal infarcts. 2. Extensive chronic small vessel ischemic disease. 3. Right A1 occlusion, new from 2008 though without  an associated acute or chronic infarct. Right A2 reconstitution via the anterior communicating artery. 4. Moderate to severe posterior circulation stenoses as above. Electronically Signed   By: Logan Bores M.D.   On: 11/15/2017 08:32   Mr Brain Wo Contrast  Result Date: 11/15/2017 CLINICAL DATA:  Numbness involving the left hand and face. Speech disturbance. EXAM: MRI HEAD WITHOUT CONTRAST MRA HEAD WITHOUT CONTRAST TECHNIQUE: Multiplanar, multiecho pulse sequences of the brain and surrounding structures were obtained without intravenous contrast. Angiographic images of the head were obtained using MRA technique without contrast. COMPARISON:  Head CT 11/14/2017, MRI 07/18/2015, and MRA 12/15/2006 FINDINGS: MRI HEAD FINDINGS Brain: Several small acute cortically based infarcts are present in the right parietal lobe and perirolandic region without associated hemorrhage. Small chronic infarcts in the right parietal cortex and right cerebellum are unchanged from 2016. Patchy and confluent T2 hyperintensities throughout the cerebral white matter and pons are similar to the prior MRI and nonspecific but compatible with extensive chronic small vessel ischemic disease. There is mild-to-moderate generalized cerebral atrophy. No mass, midline shift, or extra-axial fluid collection is seen. Vascular: Major intracranial vascular flow voids are preserved. Skull and upper cervical spine: Unremarkable bone marrow signal. Sinuses/Orbits: Bilateral cataract extraction. Paranasal sinuses and mastoid air cells are clear. Other: None. MRA HEAD FINDINGS The visualized distal vertebral arteries are patent to the basilar with the left being dominant. There is a severe mid right V4 stenosis, and there is a moderate proximal left V4 stenosis. PICAs and SCAs are patent bilaterally. The basilar artery is widely patent. There is a fetal origin of the right PCA, and there is also a small left posterior communicating artery. A moderate  proximal left P2 stenosis is noted. There is chronic asymmetric attenuation of the distal right PCA. The internal carotid arteries are patent from skull base to carotid termini with evidence of mild anterior genu and proximal supraclinoid stenosis on the right. The  MCAs are patent without evidence of proximal branch occlusion or M1 stenosis. Nonvisualization of the right A1 segment reflects interval occlusion from 2008. The anterior communicating artery is patent and supplies the right A2 segment from the left. The left A1 segment is widely patent. No aneurysm is identified. IMPRESSION: 1. Small acute right frontoparietal infarcts. 2. Extensive chronic small vessel ischemic disease. 3. Right A1 occlusion, new from 2008 though without an associated acute or chronic infarct. Right A2 reconstitution via the anterior communicating artery. 4. Moderate to severe posterior circulation stenoses as above. Electronically Signed   By: Logan Bores M.D.   On: 11/15/2017 08:32   US Carotid Bilateral  Result Date: 11/15/2017 CLINICAL DATA:  LEFT-SIDED NUMBNESS, DYSARTHRIA, DYSPHAGIA EXAM: BILATERAL CAROTID DUPLEX ULTRASOUND TECHNIQUE: Pearline Cables scale imaging, color Doppler and duplex ultrasound were performed of bilateral carotid and vertebral arteries in the neck. COMPARISON:  None. FINDINGS: Criteria: Quantification of carotid stenosis is based on velocity parameters that correlate the residual internal carotid diameter with NASCET-based stenosis levels, using the diameter of the distal internal carotid lumen as the denominator for stenosis measurement. The following velocity measurements were obtained: RIGHT ICA: 86/29 cm/sec CCA: 532/99 cm/sec SYSTOLIC ICA/CCA RATIO:  0.7 ECA:  284 cm/sec LEFT ICA: 171/45 cm/sec CCA: 242/68 cm/sec SYSTOLIC ICA/CCA RATIO:  1.2 ECA:  165 cm/sec RIGHT CAROTID ARTERY: moderate heterogeneous mixed echogenicity atherosclerosis. despite this, there is no hemodynamically significant right ica  stenosis, velocity elevation, or turbulent flow. degree of narrowing estimated at less than 50% by ultrasound criteria. RIGHT VERTEBRAL ARTERY:  Antegrade LEFT CAROTID ARTERY: similar moderate heterogeneous partially calcified left carotid bifurcation atherosclerosis. proximal left ica mild velocity elevation measures 171/45 centimeters/second. no significant turbulent flow. degree of stenosis estimated at 50-69% (close to the 50% range). LEFT VERTEBRAL ARTERY:  Antegrade IMPRESSION: Moderate bilateral carotid atherosclerosis. Right ICA narrowing less than 50% Left ICA moderate stenosis estimated at 50-69%. Patent antegrade vertebral flow bilaterally Electronically Signed   By: Jerilynn Mages.  Shick M.D.   On: 11/15/2017 09:40   Ct Head Code Stroke Wo Contrast  Result Date: 11/14/2017 CLINICAL DATA:  Code stroke.  LEFT-sided weakness. EXAM: CT HEAD WITHOUT CONTRAST TECHNIQUE: Contiguous axial images were obtained from the base of the skull through the vertex without intravenous contrast. COMPARISON:  06/08/2017. FINDINGS: Brain: No evidence for acute infarction, hemorrhage, mass lesion, hydrocephalus, or extra-axial fluid. Generalized atrophy. Extensive white matter disease. Vascular: Calcification of the cavernous internal carotid arteries consistent with cerebrovascular atherosclerotic disease. No signs of intracranial large vessel occlusion. Skull: Normal. Negative for fracture or focal lesion. Sinuses/Orbits: No acute finding. Other: None ASPECTS ([place] Stroke Program Early CT Score) - Ganglionic level infarction (caudate, lentiform nuclei, internal capsule, insula, M1-M3 cortex): 7 - Supraganglionic infarction (M4-M6 cortex): 3 Total score (0-10 with 10 being normal): 10 IMPRESSION: 1. Atrophy and small vessel disease similar to priors. No acute intracranial findings. 2. ASPECTS is 10. These results were called by telephone at the time of interpretation on 11/14/2017 at 6:09 pm to Dr. Aletta Edouard , who verbally  acknowledged these results. Electronically Signed   By: Staci Righter M.D.   On: 11/14/2017 18:13    Procedures .Critical Care Performed by: Hayden Rasmussen, MD Authorized by: Hayden Rasmussen, MD   Critical care provider statement:    Critical care time (minutes):  40   Critical care time was exclusive of:  Separately billable procedures and treating other patients   Critical care was necessary to treat or prevent imminent or  life-threatening deterioration of the following conditions:  CNS failure or compromise   Critical care was time spent personally by me on the following activities:  Development of treatment plan with patient or surrogate, discussions with consultants, evaluation of patient's response to treatment, examination of patient, obtaining history from patient or surrogate, ordering and performing treatments and interventions, ordering and review of laboratory studies, ordering and review of radiographic studies, pulse oximetry, re-evaluation of patient's condition and review of old charts   I assumed direction of critical care for this patient from another provider in my specialty: no     (including critical care time)  Medications Ordered in ED Medications  PARoxetine (PAXIL) tablet 20 mg (20 mg Oral Given 11/14/17 2133)  omega-3 acid ethyl esters (LOVAZA) capsule 2 g (2 g Oral Given 11/15/17 0921)  pantoprazole (PROTONIX) EC tablet 40 mg (40 mg Oral Given 11/15/17 0922)  nitroGLYCERIN (NITROSTAT) SL tablet 0.4 mg (has no administration in time range)  metoprolol tartrate (LOPRESSOR) tablet 25 mg (25 mg Oral Given 11/15/17 0922)  ezetimibe (ZETIA) tablet 10 mg (10 mg Oral Given 11/15/17 0921)  bisacodyl (DULCOLAX) EC tablet 5 mg (has no administration in time range)  sodium chloride flush (NS) 0.9 % injection 3 mL (3 mLs Intravenous Given 11/15/17 0922)  sodium chloride flush (NS) 0.9 % injection 3 mL (has no administration in time range)  0.9 %  sodium chloride infusion (has  no administration in time range)  acetaminophen (TYLENOL) tablet 650 mg (has no administration in time range)    Or  acetaminophen (TYLENOL) suppository 650 mg (has no administration in time range)  traZODone (DESYREL) tablet 50 mg (has no administration in time range)  polyethylene glycol (MIRALAX / GLYCOLAX) packet 17 g (has no administration in time range)  ondansetron (ZOFRAN) tablet 4 mg (has no administration in time range)    Or  ondansetron (ZOFRAN) injection 4 mg (has no administration in time range)  albuterol (PROVENTIL) (2.5 MG/3ML) 0.083% nebulizer solution 2.5 mg (has no administration in time range)  heparin injection 5,000 Units (5,000 Units Subcutaneous Given 11/15/17 0532)  aspirin chewable tablet 81 mg (81 mg Oral Given 11/15/17 0922)  hydrALAZINE (APRESOLINE) injection 10 mg (has no administration in time range)  aspirin tablet 325 mg (325 mg Oral Given 11/14/17 1920)     Initial Impression / Assessment and Plan / ED Course  I have reviewed the triage vital signs and the nursing notes.  Pertinent labs & imaging results that were available during my care of the patient were reviewed by me and considered in my medical decision making (see chart for details).  Clinical Course as of Nov 15 1117  Mon Nov 14, 2017  1821 By tele-neurology who is not recommending any TPA intervention.  Does feel he would benefit from being admitted for further stroke work-up.   [MB]  [id number] Discussed with hospitalist on-call who admit the patient to his service.    [MB]    Clinical Course User Index [MB] Hayden Rasmussen, MD     Final Clinical Impressions(s) / ED Diagnoses   Final diagnoses:  Acute CVA (cerebrovascular accident) [facility])    ED Discharge Orders    None       Hayden Rasmussen, MD 11/15/17 1124

## 2017-11-15 NOTE — Progress Notes (Signed)
[facility] PROGRESS NOTE  JACKEY HOUSEY DDU:[id number] DOB: 11-27-1940 DOA: 11/14/2017 PCP: [name], MD Brief summary and HPI: 77 y/o male with a PMH significant for CAD, history of prior TIAs, laryngeal cancer, HTN; who presented to the ED with concerns of left hand numbness while he was grocery shopping around 2:30 PM on 4/22, this progressed to numbness of the left side of his face. Patient also noticed that when he was eating, food was falling out from left side of his mouth, as per family members patient's speech was different and patient had left-sided headache. So patient was brought to the ED for possible stroke evaluation.  Patient denies loss of consciousness, chest pain , palpitations, dizziness, nausea, vomiting, abdominal pain, diarrhea, melena, hematochezia, leg pain or swelling.  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 -US Carotid doppler, MRI brain, MRA head completed; see results below -2D echo shows an EF of 60-65% with no regional wall abnormalities.  -US Carotid doppler shows less than 50% stenosis of the Right carotid artery and 50-69% stenosis of the Left carotid artery. -Patient was seen by PT; reporting that patient functioning is at baseline for functional mobility and gait, no physical deficits noted. Patient discharged from physical therapy to care of nursing for ambulation daily as tolerated for length of stay.  -Patient was seen by speech therapy reporting mild dysphagia; recommending regular heart healthy diet/thin liquids -Will allow permissive hypertension in view of stroke -A1c at 5.7 -TSH at 3.9 -Use PRN hydralazine for systolic CB>762 mmHg -Will continue full dose aspirin, Lovaza, and Zetia and wait on Neurology consult for further  recommendations/adjustment of treatment and preventive measures.  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  Chronic kidney disease stage III -Renal function appears to be close to baseline, Cr at 1.4 -Avoid nephrotoxic agents -Hold Aldactone until oral intake is improved.   Constipation -Continue Dulcolax and Miralax PRN   Anxiety -Continue Paxil -mood is stable   DVT prophylaxis: [place] Heparin Code Status: FULL Family Communication: Wife at bedside. She was informed of the medical plan going forward and answered all questions. Disposition Plan: Discharge home once stroke workup complete and neurology says patient is good to go home.  Consultants:  Neurology  Procedures:  CT Head without contrast  See results below  US Carotid bilateral  See results below  MRI Brain without contrast  See results below  MRA Head without contrast  See results below  2D Echo  Study Conclusions  - Left ventricle: The cavity size was normal. Wall thickness was   normal. Systolic function was normal. The estimated ejection   fraction was in the range of 60% to 65%.  Wall motion was normal;   there were no regional wall motion abnormalities. Left   ventricular  diastolic function parameters were normal.   Antibiotics:  None  HPI/Subjective: Patient reports symptoms improved, but still feels numbness on left side of face.  Objective: Vitals:   11/15/17 0607 11/15/17 1506  BP: (!) 127/59 136/87  Pulse: 64 (!) 59  Resp:  18  Temp: 98.1 F (36.7 C) 98.1 F (36.7 C)  SpO2: 97% 97%    Intake/Output Summary (Last 24 hours) at 11/15/2017 1516 Last data filed at 11/15/2017 0900 Gross per 24 hour  Intake 240 ml  Output -  Net 240 ml   Filed Weights   11/14/17 1754  Weight: 82.1 kg (181 lb)    Exam:   General:  Afebrile. Awake, alert, oriented x 3. Patient in no acute distress, answering questions coherently. Lying in bed comfortably. No nausea, vomiting, chest pain or headache.   Cardiovascular: RRR. S1 and S2 heard. No rubs, murmurs or gallops. No JVD. CABG scar  Respiratory: Clear to auscultation bilaterally. No using of accessory muscles. No crackles, wheezing or rhonchi. Good air movement.  Abdomen: Soft, nontender to palpation, nondistended. No organomegaly or masses felt. Positive BS x 4 quadrants.  Musculoskeletal: No cyanosis, clubbing or edema. Pulses full throughout. MS 5/5 bilaterally.  Psychiatric: Mood and affect appropriate. No hallucinations. Judgement and insight normal.  Neurologic exam: CN intact, MS 5/5, no pronator drift, normal finger to nose; expressed decrease sensation to touch on left side of his face.  Data Reviewed: Basic Metabolic Panel: Recent Labs  Lab 11/14/17 1757 11/14/17 1810 11/15/17 0714  NA 136 139 138  K 4.1 4.3 4.0  CL 100* 101 102  CO2 25  --  27  GLUCOSE 102* 98 116*  BUN 19 19 21*  CREATININE 1.47* 1.50* 1.22  CALCIUM 9.2  --  9.0   Liver Function Tests: Recent Labs  Lab 11/14/17 1757  AST 24  ALT 26  ALKPHOS 113  BILITOT 0.6  PROT 7.6  ALBUMIN 4.1   No results for input(s): LIPASE, AMYLASE in the last 168 hours. No results for input(s): AMMONIA in the last 168  hours. CBC: Recent Labs  Lab 11/14/17 1757 11/14/17 1810 11/15/17 0714  WBC 9.6  --  7.8  NEUTROABS 6.6  --   --   HGB 13.7 14.3 12.5*  HCT 40.8 42.0 38.0*  MCV 95.3  --  96.4  PLT 241  --  196   Cardiac Enzymes: No results for input(s): CKTOTAL, CKMB, CKMBINDEX, TROPONINI in the last 168 hours. BNP (last 3 results) No results for input(s): BNP in the last 8760 hours.  ProBNP (last 3 results) No results for input(s): PROBNP in the last 8760 hours.  CBG: Recent Labs  Lab 11/14/17 1821  GLUCAP 149*    No results found for this or any previous visit (from the past 240 hour(s)).   Studies: Mr Virgel Paling RJ Contrast  Result Date: 11/15/2017 CLINICAL DATA:  Numbness involving the left hand and face. Speech disturbance. EXAM: MRI HEAD WITHOUT CONTRAST MRA HEAD WITHOUT CONTRAST TECHNIQUE: Multiplanar, multiecho pulse sequences of the brain and surrounding structures were obtained without intravenous contrast. Angiographic images of the head were obtained using MRA technique without contrast. COMPARISON:  Head CT 11/14/2017, MRI 07/18/2015, and MRA 12/15/2006 FINDINGS: MRI HEAD FINDINGS Brain: Several small acute cortically based infarcts are present in the right parietal lobe and perirolandic region without associated hemorrhage. Small chronic infarcts in the  right parietal cortex and right cerebellum are unchanged from 2016. Patchy and confluent T2 hyperintensities throughout the cerebral white matter and pons are similar to the prior MRI and nonspecific but compatible with extensive chronic small vessel ischemic disease. There is mild-to-moderate generalized cerebral atrophy. No mass, midline shift, or extra-axial fluid collection is seen. Vascular: Major intracranial vascular flow voids are preserved. Skull and upper cervical spine: Unremarkable bone marrow signal. Sinuses/Orbits: Bilateral cataract extraction. Paranasal sinuses and mastoid air cells are clear. Other: None. MRA HEAD  FINDINGS The visualized distal vertebral arteries are patent to the basilar with the left being dominant. There is a severe mid right V4 stenosis, and there is a moderate proximal left V4 stenosis. PICAs and SCAs are patent bilaterally. The basilar artery is widely patent. There is a fetal origin of the right PCA, and there is also a small left posterior communicating artery. A moderate proximal left P2 stenosis is noted. There is chronic asymmetric attenuation of the distal right PCA. The internal carotid arteries are patent from skull base to carotid termini with evidence of mild anterior genu and proximal supraclinoid stenosis on the right. The MCAs are patent without evidence of proximal branch occlusion or M1 stenosis. Nonvisualization of the right A1 segment reflects interval occlusion from 2008. The anterior communicating artery is patent and supplies the right A2 segment from the left. The left A1 segment is widely patent. No aneurysm is identified. IMPRESSION: 1. Small acute right frontoparietal infarcts. 2. Extensive chronic small vessel ischemic disease. 3. Right A1 occlusion, new from 2008 though without an associated acute or chronic infarct. Right A2 reconstitution via the anterior communicating artery. 4. Moderate to severe posterior circulation stenoses as above. Electronically Signed   By: Logan Bores M.D.   On: 11/15/2017 08:32   Mr Brain Wo Contrast  Result Date: 11/15/2017 CLINICAL DATA:  Numbness involving the left hand and face. Speech disturbance. EXAM: MRI HEAD WITHOUT CONTRAST MRA HEAD WITHOUT CONTRAST TECHNIQUE: Multiplanar, multiecho pulse sequences of the brain and surrounding structures were obtained without intravenous contrast. Angiographic images of the head were obtained using MRA technique without contrast. COMPARISON:  Head CT 11/14/2017, MRI 07/18/2015, and MRA 12/15/2006 FINDINGS: MRI HEAD FINDINGS Brain: Several small acute cortically based infarcts are present in the right  parietal lobe and perirolandic region without associated hemorrhage. Small chronic infarcts in the right parietal cortex and right cerebellum are unchanged from 2016. Patchy and confluent T2 hyperintensities throughout the cerebral white matter and pons are similar to the prior MRI and nonspecific but compatible with extensive chronic small vessel ischemic disease. There is mild-to-moderate generalized cerebral atrophy. No mass, midline shift, or extra-axial fluid collection is seen. Vascular: Major intracranial vascular flow voids are preserved. Skull and upper cervical spine: Unremarkable bone marrow signal. Sinuses/Orbits: Bilateral cataract extraction. Paranasal sinuses and mastoid air cells are clear. Other: None. MRA HEAD FINDINGS The visualized distal vertebral arteries are patent to the basilar with the left being dominant. There is a severe mid right V4 stenosis, and there is a moderate proximal left V4 stenosis. PICAs and SCAs are patent bilaterally. The basilar artery is widely patent. There is a fetal origin of the right PCA, and there is also a small left posterior communicating artery. A moderate proximal left P2 stenosis is noted. There is chronic asymmetric attenuation of the distal right PCA. The internal carotid arteries are patent from skull base to carotid termini with evidence of mild anterior genu and proximal supraclinoid stenosis on the right.  The MCAs are patent without evidence of proximal branch occlusion or M1 stenosis. Nonvisualization of the right A1 segment reflects interval occlusion from 2008. The anterior communicating artery is patent and supplies the right A2 segment from the left. The left A1 segment is widely patent. No aneurysm is identified. IMPRESSION: 1. Small acute right frontoparietal infarcts. 2. Extensive chronic small vessel ischemic disease. 3. Right A1 occlusion, new from 2008 though without an associated acute or chronic infarct. Right A2 reconstitution via the  anterior communicating artery. 4. Moderate to severe posterior circulation stenoses as above. Electronically Signed   By: Logan Bores M.D.   On: 11/15/2017 08:32   US Carotid Bilateral  Result Date: 11/15/2017 CLINICAL DATA:  LEFT-SIDED NUMBNESS, DYSARTHRIA, DYSPHAGIA EXAM: BILATERAL CAROTID DUPLEX ULTRASOUND TECHNIQUE: Pearline Cables scale imaging, color Doppler and duplex ultrasound were performed of bilateral carotid and vertebral arteries in the neck. COMPARISON:  None. FINDINGS: Criteria: Quantification of carotid stenosis is based on velocity parameters that correlate the residual internal carotid diameter with NASCET-based stenosis levels, using the diameter of the distal internal carotid lumen as the denominator for stenosis measurement. The following velocity measurements were obtained: RIGHT ICA: 86/29 cm/sec CCA: 740/81 cm/sec SYSTOLIC ICA/CCA RATIO:  0.7 ECA:  284 cm/sec LEFT ICA: 171/45 cm/sec CCA: 448/18 cm/sec SYSTOLIC ICA/CCA RATIO:  1.2 ECA:  165 cm/sec RIGHT CAROTID ARTERY: moderate heterogeneous mixed echogenicity atherosclerosis. despite this, there is no hemodynamically significant right ica stenosis, velocity elevation, or turbulent flow. degree of narrowing estimated at less than 50% by ultrasound criteria. RIGHT VERTEBRAL ARTERY:  Antegrade LEFT CAROTID ARTERY: similar moderate heterogeneous partially calcified left carotid bifurcation atherosclerosis. proximal left ica mild velocity elevation measures 171/45 centimeters/second. no significant turbulent flow. degree of stenosis estimated at 50-69% (close to the 50% range). LEFT VERTEBRAL ARTERY:  Antegrade IMPRESSION: Moderate bilateral carotid atherosclerosis. Right ICA narrowing less than 50% Left ICA moderate stenosis estimated at 50-69%. Patent antegrade vertebral flow bilaterally Electronically Signed   By: Jerilynn Mages.  Shick M.D.   On: 11/15/2017 09:40   Ct Head Code Stroke Wo Contrast  Result Date: 11/14/2017 CLINICAL DATA:  Code stroke.   LEFT-sided weakness. EXAM: CT HEAD WITHOUT CONTRAST TECHNIQUE: Contiguous axial images were obtained from the base of the skull through the vertex without intravenous contrast. COMPARISON:  06/08/2017. FINDINGS: Brain: No evidence for acute infarction, hemorrhage, mass lesion, hydrocephalus, or extra-axial fluid. Generalized atrophy. Extensive white matter disease. Vascular: Calcification of the cavernous internal carotid arteries consistent with cerebrovascular atherosclerotic disease. No signs of intracranial large vessel occlusion. Skull: Normal. Negative for fracture or focal lesion. Sinuses/Orbits: No acute finding. Other: None ASPECTS ([place] Stroke Program Early CT Score) - Ganglionic level infarction (caudate, lentiform nuclei, internal capsule, insula, M1-M3 cortex): 7 - Supraganglionic infarction (M4-M6 cortex): 3 Total score (0-10 with 10 being normal): 10 IMPRESSION: 1. Atrophy and small vessel disease similar to priors. No acute intracranial findings. 2. ASPECTS is 10. These results were called by telephone at the time of interpretation on 11/14/2017 at 6:09 pm to Dr. Aletta Edouard , who verbally acknowledged these results. Electronically Signed   By: Staci Righter M.D.   On: 11/14/2017 18:13    Scheduled Meds: . aspirin  81 mg Oral Daily  . ezetimibe  10 mg Oral Daily  . heparin  5,000 Units Subcutaneous Q8H  . metoprolol tartrate  25 mg Oral BID  . omega-3 acid ethyl esters  2 capsule Oral BID  . pantoprazole  40 mg Oral Daily  . PARoxetine  20 mg Oral QHS  . sodium chloride flush  3 mL Intravenous Q12H   Continuous Infusions: . sodium chloride      Principal Problem:   TIA (transient ischemic attack) Active Problems:   HLD (hyperlipidemia)   CAD in native artery   Carotid stenosis, bilateral   Essential hypertension   Time spent: 35 minutes.   Barton Dubois  [facility] Pager [phone]. If 7PM-7AM, please contact night-coverage at [email or web address], password  [facility] 11/15/2017, 3:16 PM  LOS: 0 days

## 2017-11-15 NOTE — Evaluation (Signed)
Occupational Therapy Evaluation Patient Details Name: Kenneth [name] MRN: [id number] DOB: 03/10/1941 Today's Date: 11/15/2017    History of Present Illness Kenneth [name]  is a 77 y.o. male with past medical history relevant for CAD, history of prior TIAs, history of laryngeal cancer, hypertension who presents with concerns about left hand numbness especially the second and third fingers while he was out in a grocery store shopping, it was followed by numbness on left side of his face, patient also noticed that when he was eating food was falling out from the left side of his mouth, as per family members patient speech was different and patient had left-sided headache so was sent to the ED for possible stroke evaluation.  Patient symptoms started around 2:30 PM on 11/14/2017. MRI positive for acute infarcts   Clinical Impression   Pt agreeable to OT/PT co-evaluation, wife present. Pt demonstrates independence in B/ADL tasks in sitting and standing. BUE strength is WNL, sensation and coordination of RUE are intact. Pt reports he could not button his pants yesterday, was able to complete today without difficulty. No further OT services required at this time.     Follow Up Recommendations  No OT follow up    Equipment Recommendations  None recommended by OT       Precautions / Restrictions Precautions Precautions: None      Mobility Bed Mobility Overal bed mobility: Modified Independent                Transfers Overall transfer level: Modified independent                        ADL either performed or assessed with clinical judgement   ADL Overall ADL's : Modified independent                     Lower Body Dressing: Modified independent;Sit to/from stand Lower Body Dressing Details (indicate cue type and reason): Donning socks and pants without difficulty             Functional mobility during ADLs: Modified independent       Vision Baseline  Vision/History: No visual deficits Patient Visual Report: No change from baseline Vision Assessment?: No apparent visual deficits            Pertinent Vitals/Pain Pain Assessment: No/denies pain     Hand Dominance Right   Extremity/Trunk Assessment Upper Extremity Assessment Upper Extremity Assessment: Overall WFL for tasks assessed(BUE strength 4+/5)   Lower Extremity Assessment Lower Extremity Assessment: Defer to PT evaluation   Cervical / Trunk Assessment Cervical / Trunk Assessment: Normal   Communication Communication Communication: No difficulties   Cognition Arousal/Alertness: Awake/alert Behavior During Therapy: WFL for tasks assessed/performed Overall Cognitive Status: Within Functional Limits for tasks assessed                                                Home Living Family/patient expects to be discharged to:: Private residence Living Arrangements: Spouse/significant other;Children Available Help at Discharge: Family;Available 24 hours/day Type of Home: House Home Access: Stairs to enter [employer] of Steps: 1 Entrance Stairs-Rails: None Home Layout: One level     Bathroom Shower/Tub: Occupational psychologist: Standard     Home Equipment: Environmental consultant - 2 wheels;Cane - single point;Bedside commode;Shower seat;Grab bars - tub/shower  Prior Functioning/Environment Level of Independence: Independent                 OT Problem List: Decreased activity tolerance                       Co-evaluation PT/OT/SLP Co-Evaluation/Treatment: Yes Reason for Co-Treatment: Complexity of the patient's impairments (multi-system involvement)   OT goals addressed during session: ADL's and self-care      AM-PAC PT "6 Clicks" Daily Activity     Outcome Measure Help from another person eating meals?: None Help from another person taking care of personal grooming?: None Help from another person toileting,  which includes using toliet, bedpan, or urinal?: None Help from another person bathing (including washing, rinsing, drying)?: None Help from another person to put on and taking off regular upper body clothing?: None Help from another person to put on and taking off regular lower body clothing?: None 6 Click Score: 24   End of Session    Activity Tolerance: Patient tolerated treatment well Patient left: in bed;with call bell/phone within reach;with family/visitor present  OT Visit Diagnosis: Muscle weakness (generalized) (M62.81)                Time: 5361-4431 OT Time Calculation (min): 16 min Charges:  OT General Charges $OT Visit: 1 Visit OT Evaluation $OT Eval Low Complexity: 1 Low    Guadelupe Sabin, OTR/L  [phone] 11/15/2017, 8:49 AM

## 2017-11-15 NOTE — Evaluation (Signed)
Clinical/Bedside Swallow Evaluation Patient Details  Name: Kenneth [name] MRN: [id number] Date of Birth: 07-23-1941  Today's Date: 11/15/2017 Time: SLP Start Time (ACUTE ONLY): 1344 SLP Stop Time (ACUTE ONLY): 1403 SLP Time Calculation (min) (ACUTE ONLY): 19 min  Past Medical History:  Past Medical History:  Diagnosis Date  . Anxiety   . ASCVD (arteriosclerotic cardiovascular disease)   . Coronary artery disease    a. 2003: s/p CABG x3V  b. 2007: cath with patent bypass grafts.  c. 09/2016: [place] with cath showing patent bypass grafts, medical Rx recommended  . Depression   . GERD (gastroesophageal reflux disease)   . High triglycerides   . Hypertension   . 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 Nephrolithiasis   . Peripheral vascular disease ([place])   . Stroke ([place])   . Tobacco abuse   . Tracheal stenosis    Past Surgical History:  Past Surgical History:  Procedure Laterality Date  . CARDIAC SURGERY    . CAROTID STENT Right 06-13-12  . CAROTID STENT INSERTION N/A 06/13/2012   Procedure: CAROTID STENT INSERTION;  Surgeon: Serafina Mitchell, MD;  Location: [facility] CATH LAB;  Service: Cardiovascular;  Laterality: N/A;  . CHOLECYSTECTOMY  2007   Gall Bladder  . COLONOSCOPY  11/10/2011   Dr. [name]: hemorrhoids, tubular adenoma  . CORONARY ARTERY BYPASS GRAFT    . EYE SURGERY    . LEFT HEART CATH AND CORS/GRAFTS ANGIOGRAPHY N/A 10/07/2016   Procedure: Left Heart Cath and Cors/Grafts Angiography;  Surgeon: Troy Sine, MD;  Location: [facility] CV LAB;  Service: Cardiovascular;  Laterality: N/A;  . PARTIAL LARYNGECTOMY  1998   HPI:  77 y.o. male with past medical history relevant for CAD, history of prior TIAs, history of laryngeal cancer, hypertension who presents with concerns about left hand numbness especially the second and third fingers while he was out in a grocery store shopping, it was followed by numbness on left side of his face, patient also noticed that when he was eating food was  falling out from the left side of his mouth, as per family members patient speech was different and patient had left-sided headache so was sent to the ED for possible stroke evaluation. 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  Assessment / Plan / Recommendation Clinical Impression   Pt presents with mild oropharyngeal dysphagia characterized by slight left decreased sensation in buccal/lingual area with resultant intermittent pocketing of solids and anterior/lateral left loss of liquids via cup sips; pt is aware of this issue and is using lingual sweep to assist with this difficulty; SLP recommended smaller sips via straw to reduce and/or eliminate anterior/left labial loss; pt stated he has some regurgitation with larger amounts of liquids if he "bends over" after intake (prior to recent CVA) and SLP instructed him to take smaller sips during consumption of liquids to eliminate this occurrence; recommend Regular consistency (heart healthy)/thin via straw with small sips and left lingual sweep with general swallowing precautions in place during PO intake; ST will f/u x1 while in acute setting for diet tolerance; pt able to restate swallowing precautions independently with teach back; thank you for this consult. SLP Visit Diagnosis: Dysphagia, oropharyngeal  phase (R13.12)    Aspiration Risk  Mild aspiration risk    Diet Recommendation   Regular (heart healthy)/thin liquids  Medication Administration: Other (Comment)(whole with liquids as tolerated)    Other  Recommendations Oral Care Recommendations: Oral care BID   Follow up Recommendations None      Frequency and Duration min 1  x/week  1 week       Prognosis Prognosis for Safe Diet Advancement: Good      Swallow Study   General Date of Onset: 11/14/17 HPI: 77 y.o. male with past medical history relevant for CAD, history of prior TIAs, history of laryngeal cancer, hypertension who presents with concerns about left hand numbness especially the second and third fingers while he was out in a grocery store shopping, it was followed by numbness on left side of his face, patient also noticed that when he was eating food was falling out from the left side of his mouth, as per family members patient speech was different and patient had left-sided headache so was sent to the ED for possible stroke evaluation. Type of Study: Bedside Swallow Evaluation Previous Swallow Assessment: NSSS passed 11/14/17 Diet Prior to this Study: Regular;Thin liquids(Heart healthy) Temperature Spikes Noted: No Respiratory Status: Room air History of Recent Intubation: No Behavior/Cognition: Alert;Cooperative;Pleasant mood Oral Cavity Assessment: Within Functional Limits Oral Care Completed by SLP: No Oral Cavity - Dentition: Adequate natural dentition Vision: Functional for self-feeding Self-Feeding Abilities: Able to feed self Patient Positioning: Upright in chair Baseline Vocal Quality: Hoarse;Other (comment)(hx of vocal cord cancer) Volitional Cough: Strong Volitional Swallow: Able to elicit    Oral/Motor/Sensory Function Overall Oral Motor/Sensory Function: Mild impairment Facial ROM: Within Functional Limits Facial Symmetry: Within Functional Limits Facial Strength: Within Functional Limits Facial Sensation: Reduced left Lingual ROM: Within Functional Limits Lingual Symmetry: Within Functional Limits Lingual Strength: Within Functional Limits Lingual Sensation: Reduced   Ice Chips Ice chips: Within functional limits Presentation: Spoon   Thin Liquid Thin Liquid: Within functional limits Presentation: Cup;Straw    Nectar  Thick Nectar Thick Liquid: Not tested   Honey Thick Honey Thick Liquid: Not tested   Puree Puree: Within functional limits Presentation: Self Fed   Solid      Solid: Within functional limits Presentation: Self Fed        Elvina Sidle, M.S., CCC-SLP 11/15/2017,2:47 PM

## 2017-11-15 NOTE — Consult Note (Addendum)
Kenneth A. Merlene Laughter, MD     [email or web address]          Kenneth [name] is an 77 y.o. male.   ASSESSMENT/PLAN:  ((((((((((((ADDENDUM:     The carotid stenosis below is actually on the asymptomatic side on the left. The patient's stroke is therefore cryptogenic and he should have a 30 day event monitor.  )))))))))))))))))))))))))))))))))   1. 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   I recommend that the case be discussed with vascular surgery -   Interventional neuroradiology. Patient will be placed on dual antiplatelet agents (  Aspirin 325/Plavix 75)  for 3 months or until endarterectomy is done if appropriate. A statin is recommended if he can tolerate this.  2.  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            The patient is 77 year old white male who presents with the acute onset of left facial numbness and weakness.  The patient reports difficulty swallowing.  It is unclear if he has associated dysarthria.  He has significant baseline dysarthria related to laryngeal surgery over 20 years ago.  It appears he may have had some gait instability with his symptoms.  He does not report focal weakness however of the extremities.  There are no reports of headaches, dyspnea, chest pain or dizziness.  He does have significant hearing impairment at baseline.  He functions fairly well baseline per the family. The review of systems otherwise unrevealing.      GENERAL:  This is a very pleasant male in no acute distress.  HEENT:    No trauma appreciated.  He is severely hearing impaired.  ABDOMEN: soft  EXTREMITIES: No edema; there is significant arthritic changes in multiple joints.   BACK:   This is normal.  SKIN: Normal by inspection.    MENTAL STATUS: Alert and oriented -  Including to age and month. Speech -   Severely dysarthric mostly due to hypophonia, language and cognition are generally intact. Judgment and insight normal.   CRANIAL NERVES: Pupils are equal, round and reactive to light and accomodation; extra ocular movements are full, there is no significant nystagmus; visual fields are full; upper and lower facial muscles are normal in strength and symmetric, there is no flattening of the nasolabial folds; tongue is midline; uvula is midline; shoulder elevation is normal.  MOTOR: Normal tone, bulk and strength; no pronator drift.  No drift of the upper lower extremities.  COORDINATION: Left finger to nose is normal, right finger to nose is normal, No rest tremor; no intention tremor; no postural tremor; no bradykinesia.  REFLEXES: Deep tendon reflexes are symmetrical and normal. Plantar reflexes are flexor bilaterally.   SENSATION: Normal to light touch, temperature, and pain.   Reduced sensation involving the left facial region.  No extinction is appreciated.  GAIT: Normal     NIH stroke scale 2   Blood pressure 136/87, pulse (!) 59, temperature 98.1 F (36.7 C), temperature source Oral, resp. rate 18, height _0  (1.727 m), weight 181 lb (82.1 kg), SpO2 97 %.  Past Medical History:  Diagnosis  Date  . Anxiety   . ASCVD (arteriosclerotic cardiovascular disease)   . Coronary artery disease    a. 2003: s/p CABG x3V  b. 2007: cath with patent bypass grafts.  c. 09/2016: [place] with cath showing patent bypass grafts, medical Rx recommended  . Depression   . GERD (gastroesophageal reflux disease)   . High triglycerides   . Hypertension   . 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 Nephrolithiasis   .  Peripheral vascular disease ([place])   . Stroke ([place])   . Tobacco abuse   . Tracheal stenosis     Past Surgical History:  Procedure Laterality Date  . CARDIAC SURGERY    . CAROTID STENT Right 06-13-12  . CAROTID STENT INSERTION N/A 06/13/2012   Procedure: CAROTID STENT INSERTION;  Surgeon: Serafina Mitchell, MD;  Location: [facility] CATH LAB;  Service: Cardiovascular;  Laterality: N/A;  . CHOLECYSTECTOMY  2007   Gall Bladder  . COLONOSCOPY  11/10/2011   Dr. [name]: hemorrhoids, tubular adenoma  . CORONARY ARTERY BYPASS GRAFT    . EYE SURGERY    . LEFT HEART CATH AND CORS/GRAFTS ANGIOGRAPHY N/A 10/07/2016   Procedure: Left Heart Cath and Cors/Grafts Angiography;  Surgeon: Troy Sine, MD;  Location: [facility] CV LAB;  Service: Cardiovascular;  Laterality: N/A;  . PARTIAL LARYNGECTOMY  1998    Family History  Problem Relation Age of Onset  . Dementia Mother   . Heart disease Father   . Cancer Brother   . Heart disease Brother   . Hyperlipidemia Brother   . Cancer Brother   . Colon cancer Neg Hx     Social History:  reports that he quit smoking about 47 years ago. His smoking use included cigarettes. He started smoking about 64 years ago. He has a 15.00 pack-year smoking history. He quit smokeless tobacco use about 15 years ago. His smokeless tobacco use included chew. He reports that he does not drink alcohol or use drugs.  Allergies:  Allergies  Allergen Reactions  . Nifedipine Other (See Comments)  . Erythromycin   . Lorazepam Other (See Comments)    REACTION: Alters mental status. "Turns into maniac"  . Penicillins Hives    Has patient had a PCN reaction causing immediate rash, facial/tongue/throat swelling, SOB or lightheadedness with hypotension: Yes Has patient had a PCN reaction causing severe rash involving mucus membranes or skin necrosis: No Has patient had a PCN reaction that required hospitalization No Has patient had a PCN reaction occurring within the last 10 years:  No If all of the above answers are "NO", then may proceed with Cephalosporin use.   . Sulfonamide Derivatives Other (See Comments)    REACTION: Unknown to patient. States that MD states allergy per medical records  . Statins Itching and Rash    Medications: Prior to Admission medications   Medication Sig Start Date End Date Taking? Authorizing Provider  bisacodyl (DULCOLAX) 5 MG EC tablet Take 5 mg by mouth daily as needed for moderate constipation.   Yes [provider]  ezetimibe (ZETIA) 10 MG tablet Take 1 tablet (10 mg total) by mouth daily. 09/07/17 12/06/17 Yes Branch, Alphonse Guild, MD  nitroGLYCERIN (NITROSTAT) 0.4 MG SL tablet Place 1 tablet (0.4 mg total) under the tongue every 5 (five) minutes as needed for chest pain. 01/31/17 05/25/21 Yes Lendon Colonel, NP  Omega-3 Fatty Acids (OMEGA-3 FISH OIL PO) Take 1 capsule by mouth daily.   Yes [provider]  amLODipine (NORVASC) 10  MG tablet Take 1 tablet (10 mg total) by mouth daily. 08/26/17 11/24/17  Arnoldo Lenis, MD  aspirin 81 MG tablet Take 81 mg by mouth at bedtime.     [provider]  metoprolol tartrate (LOPRESSOR) 25 MG tablet Take 0.5 tablets (12.5 mg total) by mouth 2 (two) times daily. 08/26/17   Arnoldo Lenis, MD  omeprazole (PRILOSEC) 20 MG capsule Take 20 mg every evening by mouth.     [provider]  PARoxetine (PAXIL) 20 MG tablet Take 20 mg by mouth at bedtime.     [provider]  spironolactone (ALDACTONE) 25 MG tablet Take 25 mg every evening by mouth.    [provider]    Scheduled Meds: . aspirin  325 mg Oral Daily  . ezetimibe  10 mg Oral Daily  . heparin  5,000 Units Subcutaneous Q8H  . metoprolol tartrate  25 mg Oral BID  . omega-3 acid ethyl esters  2 capsule Oral BID  . pantoprazole  40 mg Oral Daily  . PARoxetine  20 mg Oral QHS  . sodium chloride flush  3 mL Intravenous Q12H   Continuous Infusions: . sodium chloride     PRN  Meds:.sodium chloride, acetaminophen **OR** acetaminophen, albuterol, bisacodyl, hydrALAZINE, nitroGLYCERIN, ondansetron **OR** ondansetron (ZOFRAN) IV, polyethylene glycol, sodium chloride flush, traZODone     Results for orders placed or performed during the hospital encounter of 11/14/17 (from the past 48 hour(s))  Protime-INR     Status: None   Collection Time: 11/14/17  5:57 PM  Result Value Ref Range   Prothrombin Time 12.6 11.4 - 15.2 seconds   INR 0.95     Comment: Performed at [facility], [address] [place], [place] [address]  APTT     Status: None   Collection Time: 11/14/17  5:57 PM  Result Value Ref Range   aPTT 27 24 - 36 seconds    Comment: Performed at [facility], [address], [place], [place] [address]  CBC     Status: None   Collection Time: 11/14/17  5:57 PM  Result Value Ref Range   WBC 9.6 4.0 - 10.5 K/uL   RBC 4.28 4.22 - 5.81 MIL/uL   Hemoglobin 13.7 13.0 - 17.0 g/dL   HCT 40.8 39.0 - 52.0 %   MCV 95.3 78.0 - 100.0 fL   MCH 32.0 26.0 - 34.0 pg   MCHC 33.6 30.0 - 36.0 g/dL   RDW 13.3 11.5 - 15.5 %   Platelets 241 150 - 400 K/uL    Comment: Performed at [facility], [address], [place], [place] [address]  Differential     Status: Abnormal   Collection Time: 11/14/17  5:57 PM  Result Value Ref Range   Neutrophils Relative % 68 %   Neutro Abs 6.6 1.7 - 7.7 K/uL   Lymphocytes Relative 18 %   Lymphs Abs 1.7 0.7 - 4.0 K/uL   Monocytes Relative 12 %   Monocytes Absolute 1.2 (H) 0.1 - 1.0 K/uL   Eosinophils Relative 2 %   Eosinophils Absolute 0.1 0.0 - 0.7 K/uL   Basophils Relative 0 %   Basophils Absolute 0.0 0.0 - 0.1 K/uL    Comment: Performed at [facility], [address] [place], [place] [address]  Comprehensive metabolic panel     Status: Abnormal   Collection Time: 11/14/17  5:57 PM  Result Value Ref Range   Sodium 136 135 - 145 mmol/L   Potassium 4.1  3.5 - 5.1 mmol/L   Chloride 100 (L) 101 - 111 mmol/L   CO2 25 22 - 32 mmol/L    Glucose, Bld 102 (H) 65 - 99 mg/dL   BUN 19 6 - 20 mg/dL   Creatinine, Ser 1.47 (H) 0.61 - 1.24 mg/dL   Calcium 9.2 8.9 - 10.3 mg/dL   Total Protein 7.6 6.5 - 8.1 g/dL   Albumin 4.1 3.5 - 5.0 g/dL   AST 24 15 - 41 U/L   ALT 26 17 - 63 U/L   Alkaline Phosphatase 113 38 - 126 U/L   Total Bilirubin 0.6 0.3 - 1.2 mg/dL   GFR calc non Af Amer 44 (L) >60 mL/min   GFR calc Af Amer 51 (L) >60 mL/min    Comment: (NOTE) The eGFR has been calculated using the CKD EPI equation. This calculation has not been validated in all clinical situations. eGFR's persistently <60 mL/min signify possible Chronic Kidney Disease.    Anion gap 11 5 - 15    Comment: Performed at [facility], [address], [place], [place] [address]  I-Stat Chem 8, ED     Status: Abnormal   Collection Time: 11/14/17  6:10 PM  Result Value Ref Range   Sodium 139 135 - 145 mmol/L   Potassium 4.3 3.5 - 5.1 mmol/L   Chloride 101 101 - 111 mmol/L   BUN 19 6 - 20 mg/dL   Creatinine, Ser 1.50 (H) 0.61 - 1.24 mg/dL   Glucose, Bld 98 65 - 99 mg/dL   Calcium, Ion 1.18 1.15 - 1.40 mmol/L   TCO2 26 22 - 32 mmol/L   Hemoglobin 14.3 13.0 - 17.0 g/dL   HCT 42.0 39.0 - 52.0 %  I-stat troponin, ED     Status: None   Collection Time: 11/14/17  6:13 PM  Result Value Ref Range   Troponin i, poc 0.01 0.00 - 0.08 ng/mL   Comment 3            Comment: Due to the release kinetics of cTnI, a negative result within the first hours of the onset of symptoms does not rule out myocardial infarction with certainty. If myocardial infarction is still suspected, repeat the test at appropriate intervals.   CBG monitoring, ED     Status: Abnormal   Collection Time: 11/14/17  6:21 PM  Result Value Ref Range   Glucose-Capillary 149 (H) 65 - 99 mg/dL  TSH     Status: None   Collection Time: 11/14/17  7:24 PM  Result Value Ref Range   TSH 3.910 0.350 - 4.500 uIU/mL    Comment: Performed by a 3rd Generation assay with a functional sensitivity of  <=0.01 uIU/mL. Performed at [facility], [address] [place], [place] [address]   Hemoglobin A1c     Status: Abnormal   Collection Time: 11/14/17  7:24 PM  Result Value Ref Range   Hgb A1c MFr Bld 5.9 (H) 4.8 - 5.6 %    Comment: (NOTE) Pre diabetes:          5.7%-6.4% Diabetes:              >6.4% Glycemic control for   <7.0% adults with diabetes    Mean Plasma Glucose 122.63 mg/dL    Comment: Performed at [facility] [address] [place], [place] [address]  Basic metabolic panel     Status: Abnormal   Collection Time: 11/15/17  7:14 AM  Result Value Ref Range  Sodium 138 135 - 145 mmol/L   Potassium 4.0 3.5 - 5.1 mmol/L   Chloride 102 101 - 111 mmol/L   CO2 27 22 - 32 mmol/L   Glucose, Bld 116 (H) 65 - 99 mg/dL   BUN 21 (H) 6 - 20 mg/dL   Creatinine, Ser 1.22 0.61 - 1.24 mg/dL   Calcium 9.0 8.9 - 10.3 mg/dL   GFR calc non Af Amer 55 (L) >60 mL/min   GFR calc Af Amer >60 >60 mL/min    Comment: (NOTE) The eGFR has been calculated using the CKD EPI equation. This calculation has not been validated in all clinical situations. eGFR's persistently <60 mL/min signify possible Chronic Kidney Disease.    Anion gap 9 5 - 15    Comment: Performed at [facility], [address] [place], [place] [address]  CBC     Status: Abnormal   Collection Time: 11/15/17  7:14 AM  Result Value Ref Range   WBC 7.8 4.0 - 10.5 K/uL   RBC 3.94 (L) 4.22 - 5.81 MIL/uL   Hemoglobin 12.5 (L) 13.0 - 17.0 g/dL   HCT 38.0 (L) 39.0 - 52.0 %   MCV 96.4 78.0 - 100.0 fL   MCH 31.7 26.0 - 34.0 pg   MCHC 32.9 30.0 - 36.0 g/dL   RDW 13.3 11.5 - 15.5 %   Platelets 196 150 - 400 K/uL    Comment: Performed at [facility], [address] [place] [address] [place], [place] [address]  Lipid panel     Status: Abnormal   Collection Time: 11/15/17  7:14 AM  Result Value Ref Range   Cholesterol 185 0 - 200 mg/dL   Triglycerides 439 (H) <150 mg/dL   HDL 29 (L) >40 mg/dL   Total CHOL/HDL Ratio 6.4 RATIO   VLDL  UNABLE TO CALCULATE IF TRIGLYCERIDE OVER 400 mg/dL 0 - 40 mg/dL   LDL Cholesterol UNABLE TO CALCULATE IF TRIGLYCERIDE OVER 400 mg/dL 0 - 99 mg/dL    Comment:        Total Cholesterol/HDL:CHD Risk Coronary Heart Disease Risk Table                     Men   Women  1/2 Average Risk   3.4   3.3  Average Risk       5.0   4.4  2 X Average Risk   9.6   7.1  3 X Average Risk  23.4   11.0        Use the calculated Patient Ratio above and the CHD Risk Table to determine the patient's CHD Risk.        ATP III CLASSIFICATION (LDL):  <100     mg/dL   Optimal  100-129  mg/dL   Near or Above                    Optimal  130-159  mg/dL   Borderline  160-189  mg/dL   High  >190     mg/dL   Very High Performed at [facility], [address], [place], [place] [address]     Studies/Results: BRAIN MRA MRI  FINDINGS: MRI HEAD FINDINGS  Brain: Several small acute cortically based infarcts are present in the right parietal lobe and perirolandic region without associated hemorrhage. Small chronic infarcts in the right parietal cortex and right cerebellum are unchanged from 2016. Patchy and confluent T2 hyperintensities throughout the cerebral white matter and pons are similar to the  prior MRI and nonspecific but compatible with extensive chronic small vessel ischemic disease. There is mild-to-moderate generalized cerebral atrophy. No mass, midline shift, or extra-axial fluid collection is seen.  Vascular: Major intracranial vascular flow voids are preserved.  Skull and upper cervical spine: Unremarkable bone marrow signal.  Sinuses/Orbits: Bilateral cataract extraction. Paranasal sinuses and mastoid air cells are clear.  Other: None.  MRA HEAD FINDINGS  The visualized distal vertebral arteries are patent to the basilar with the left being dominant. There is a severe mid right V4 stenosis, and there is a moderate proximal left V4 stenosis. PICAs and SCAs are patent bilaterally.  The basilar artery is widely patent. There is a fetal origin of the right PCA, and there is also a small left posterior communicating artery. A moderate proximal left P2 stenosis is noted. There is chronic asymmetric attenuation of the distal right PCA.  The internal carotid arteries are patent from skull base to carotid termini with evidence of mild anterior genu and proximal supraclinoid stenosis on the right. The MCAs are patent without evidence of proximal branch occlusion or M1 stenosis. Nonvisualization of the right A1 segment reflects interval occlusion from 2008. The anterior communicating artery is patent and supplies the right A2 segment from the left. The left A1 segment is widely patent. No aneurysm is identified.  IMPRESSION: 1. Small acute right frontoparietal infarcts. 2. Extensive chronic small vessel ischemic disease. 3. Right A1 occlusion, new from 2008 though without an associated acute or chronic infarct. Right A2 reconstitution via the anterior communicating artery. 4. Moderate to severe posterior circulation stenoses as above.     CAROTID DOPPLERS  RIGHT ICA: 86/29 cm/sec CCA: 976/73 cm/sec  SYSTOLIC ICA/CCA RATIO:  0.7  ECA:  284 cm/sec  LEFT  ICA: 171/45 cm/sec  CCA: 419/37 cm/sec  SYSTOLIC ICA/CCA RATIO:  1.2  ECA:  165 cm/sec  RIGHT CAROTID ARTERY: moderate heterogeneous mixed echogenicity atherosclerosis. despite this, there is no hemodynamically significant right ica stenosis, velocity elevation, or turbulent flow. degree of narrowing estimated at less than 50% by ultrasound criteria.  RIGHT VERTEBRAL ARTERY:  Antegrade  LEFT CAROTID ARTERY: similar moderate heterogeneous partially calcified left carotid bifurcation atherosclerosis. proximal left ica mild velocity elevation measures 171/45 centimeters/second. no significant turbulent flow. degree of stenosis estimated at 50-69% (close to the 50% range).  LEFT  VERTEBRAL ARTERY:  Antegrade  IMPRESSION: Moderate bilateral carotid atherosclerosis.  Right ICA narrowing less than 50%  Left ICA moderate stenosis estimated at 50-69%.  Patent antegrade vertebral flow bilaterally     TTE - Left ventricle: The cavity size was normal. Wall thickness was   normal. Systolic function was normal. The estimated ejection   fraction was in the range of 60% to 65%. Wall motion was normal;   there were no regional wall motion abnormalities. Left   ventricular diastolic function parameters were normal. - Aortic valve: Mildly calcified annulus. Trileaflet; mildly   thickened leaflets. Valve area (VTI): 3.01 cm^2. Valve area   (Vmax): 2.24 cm^2. Valve area (Vmean): 2.3 cm^2. - Mitral valve: Mildly calcified annulus. Mildly thickened leaflets   . - Left atrium: The atrium was mildly dilated.    Brain MRI scan is reviewed in person. There is several mostly cortical base small to moderate size increase in no DWI involving the posterior right frontal area and right parietal area. These are suspicious for acute ischemic embolic phenomena. There is confluent chronic microvascular changes. MRA shows an occluded right A2 segments without associated infarct. No hemorrhage  is appreciated.There is also a hypoplastic right vertebral.     Clotee Schlicker A. Merlene [name], M.D.  Diplomate, Tax adviser of Psychiatry and Neurology ( Neurology). 11/15/2017, 7:48 PM

## 2017-11-16 DIAGNOSIS — G459 Transient cerebral ischemic attack, unspecified: Secondary | ICD-10-CM

## 2017-11-16 DIAGNOSIS — I6523 Occlusion and stenosis of bilateral carotid arteries: Secondary | ICD-10-CM

## 2017-11-16 DIAGNOSIS — E782 Mixed hyperlipidemia: Secondary | ICD-10-CM

## 2017-11-16 DIAGNOSIS — I2510 Atherosclerotic heart disease of native coronary artery without angina pectoris: Secondary | ICD-10-CM

## 2017-11-16 DIAGNOSIS — I639 Cerebral infarction, unspecified: Secondary | ICD-10-CM

## 2017-11-16 DIAGNOSIS — I10 Essential (primary) hypertension: Secondary | ICD-10-CM

## 2017-11-16 MED ORDER — ASPIRIN 325 MG PO TABS: 325.0000 mg | ORAL_TABLET | Freq: Every day | ORAL | Status: DC

## 2017-11-16 MED ORDER — FLUVASTATIN SODIUM ER 80 MG PO TB24: 80.0000 mg | ORAL_TABLET | Freq: Every day | ORAL | 0 refills | Status: DC

## 2017-11-16 MED ORDER — CLOPIDOGREL BISULFATE 75 MG PO TABS: 75.0000 mg | ORAL_TABLET | Freq: Every day | ORAL | 0 refills | Status: AC

## 2017-11-16 NOTE — Progress Notes (Signed)
IV removed, WNL. D/C instructions given to pt and family member. Verbalized understanding. Pt family at bedside to transport home.

## 2017-11-16 NOTE — Discharge Summary (Addendum)
Physician Discharge Summary  Kenneth [name] BLT:[id number] DOB: [birth year] DOA: 11/14/2017  PCP: [name], MD VVS: Dr. Oneida Alar  Admit date: 11/14/2017 Discharge date: 11/16/2017  Admitted From: Home  Disposition: Home  Recommendations for Outpatient Follow-up:  1. Follow up with PCP in 1 weeks 2. Follow up with vascular surgery in 2-4 weeks to discuss left carotid artery disease 3. Follow up with neurology in 4-6 weeks 4. Discuss starting STATIN with primary care physician for stroke prevention: Trial RX given Lescol XL QHS  Discharge Condition: STABLE   CODE STATUS: FULL    Brief Hospitalization Summary: Please see all hospital notes, images, labs for full details of the hospitalization.   Kenneth [name]  is a 77 y.o. male with past medical history relevant for CAD, history of prior TIAs, history of laryngeal cancer, hypertension who presents with concerns about left hand numbness especially the second and third fingers while he was out in a grocery store shopping, it was followed by numbness on left side of his face, patient also noticed that when he was eating food was falling out from the left side of his mouth, as per family members patient speech was different and patient had left-sided headache so was sent to the ED for possible stroke evaluation.  Patient symptoms started around 2:30 PM on 11/14/2017  No syncope, no chest pain palpitations dizziness, no pleuritic symptoms no leg pains or leg swelling  Patient son at bedside, questions answered  In the ED CT head without acute findings, left hand and left facial numbness improved but did not resolve  Brief summary and HPI: 77 y/o male with a PMH significant for CAD, history of prior TIAs, laryngeal cancer, HTN; who presented to the ED with concerns of left hand numbness while he was grocery shopping around 2:30 PM on 4/22, this progressed to numbness of the left side of his face. Patient also noticed that when he was eating,  food was falling out from left side of his mouth, as per family members patient's speech was different and patient had left-sided headache. So patient was brought to the ED for possible stroke evaluation.  Patient denies loss of consciousness, chest pain , palpitations, dizziness, nausea, vomiting, abdominal pain, diarrhea, melena, hematochezia, leg pain or swelling.  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 -US Carotid doppler, MRI brain, MRA head completed; see results below -2D echo shows an EF of 60-65% with no regional wall abnormalities.  -US Carotid doppler shows less than 50% stenosis of the Right carotid artery and 50-69% stenosis of the Left carotid artery. -Patient was seen by PT; reporting that patient functioning is at baseline for functional mobility and gait, no physical deficits noted. Patient discharged from physical therapy to care of nursing for ambulation daily as tolerated for length of stay.  -Patient was seen by speech therapy reporting mild dysphagia; recommending regular heart healthy diet/thin liquids -Did initially allow permissive hypertension in view of stroke, resume home BP meds at discharge and follow up with PCP.  -A1c at 5.7 -TSH at 3.9 -Use PRN hydralazine for systolic AQ>762 mmHg -Will continue full dose aspirin, Plavix 75 mg,  Lovaza, and Zetia per Neurology consult and have referred patient to follow up with vascular surgeon Dr. Oneida Alar to evaluate left carotid artery disease.   Hx of CAD/CABG/PAD -S/P CABG and S/P  carotid artery stenting -Echo shows an EF of 60-65% with no regional wall abnormalities -Patient has an intolerance to statins; with an episode of rash and itching in 2013, I asked patient to discuss restarting a statin with PCP for  stroke prevention.  Trial RX of Lescol XL 80 mg QHS ordered to see if he could tolerate this .  Follow up with PCP to discuss.   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  Chronic kidney disease stage III -Renal function appears to be close to baseline, Cr at 1.4 -Avoid nephrotoxic agents -Hold Aldactone until oral intake is improved.   Constipation -Continue Dulcolax and Miralax PRN   Anxiety -Continue Paxil -mood is stable   DVT prophylaxis: [place] Heparin Code Status: FULL Family Communication: Wife at bedside. She was informed of the medical plan going forward and answered all questions. Disposition Plan: Discharge home  Consultants:  Neurology  Procedures:  CT Head without contrast             See results below  US Carotid bilateral             See results below  MRI Brain without contrast             See results below  MRA Head without contrast             See results below  2D Echo             Study Conclusions  - Left ventricle: The cavity size was normal. Wall thickness was normal. Systolic function was normal. The estimated ejection fraction was in the range of 60% to 65%. Wall motion was normal; there were no regional wall motion abnormalities. Left ventricular diastolic function parameters were normal.  Discharge Diagnoses:  Principal Problem:   TIA (transient ischemic attack) Active Problems:    HLD (hyperlipidemia)   CAD in native artery   Carotid stenosis, bilateral   Essential hypertension   Ischemic stroke [facility])  Discharge Instructions: Discharge Instructions    Call MD for:  difficulty breathing, headache or visual disturbances   Complete by:  As directed    Call MD for:  extreme fatigue   Complete by:  As directed    Call MD for:  persistant nausea and vomiting   Complete by:  As directed    Call MD for:  redness, tenderness, or signs of infection (pain, swelling, redness, odor or green/yellow discharge around incision site)   Complete by:  As directed    Call MD for:  severe uncontrolled pain   Complete by:  As directed    Increase activity slowly   Complete by:  As directed      Allergies as of 11/16/2017      Reactions   Nifedipine Other (See Comments)   Erythromycin    Lorazepam Other (See Comments)   REACTION: Alters mental status. "Turns into maniac"   Penicillins Hives   Has patient had a PCN reaction causing immediate rash, facial/tongue/throat swelling, SOB or lightheadedness with  hypotension: Yes Has patient had a PCN reaction causing severe rash involving mucus membranes or skin necrosis: No Has patient had a PCN reaction that required hospitalization No Has patient had a PCN reaction occurring within the last 10 years: No If all of the above answers are "NO", then may proceed with Cephalosporin use.   Sulfonamide Derivatives Other (See Comments)   REACTION: Unknown to patient. States that MD states allergy per medical records   Statins Itching, Rash      Medication List    TAKE these medications   amLODipine 10 MG tablet Commonly known as:  NORVASC Take 1 tablet (10 mg total) by mouth daily.   aspirin 325 MG tablet Take 1 tablet (325 mg total) by mouth daily. What changed:    medication strength  how much to take  when to take this   bisacodyl 5 MG EC tablet Commonly known as:  DULCOLAX Take 5 mg by mouth daily as needed for moderate  constipation.   clopidogrel 75 MG tablet Commonly known as:  PLAVIX Take 1 tablet (75 mg total) by mouth daily with breakfast.   ezetimibe 10 MG tablet Commonly known as:  ZETIA Take 1 tablet (10 mg total) by mouth daily.   fluvastatin XL 80 MG 24 hr tablet Commonly known as:  LESCOL XL Take 1 tablet (80 mg total) by mouth at bedtime.   metoprolol tartrate 25 MG tablet Commonly known as:  LOPRESSOR Take 0.5 tablets (12.5 mg total) by mouth 2 (two) times daily.   nitroGLYCERIN 0.4 MG SL tablet Commonly known as:  NITROSTAT Place 1 tablet (0.4 mg total) under the tongue every 5 (five) minutes as needed for chest pain.   OMEGA-3 FISH OIL PO Take 1 capsule by mouth daily.   omeprazole 20 MG capsule Commonly known as:  PRILOSEC Take 20 mg every evening by mouth.   PARoxetine 20 MG tablet Commonly known as:  PAXIL Take 20 mg by mouth at bedtime.   spironolactone 25 MG tablet Commonly known as:  ALDACTONE Take 25 mg every evening by mouth.      Follow-up Information    [name], MD. Schedule an appointment as soon as possible for a visit in 1 week(s).   Specialty:  Internal Medicine Why:  Hospital Follow Up  Contact information: [address] [place] [address] [phone]        Elam Dutch, MD. Schedule an appointment as soon as possible for a visit in 2 week(s).   Specialties:  Vascular Surgery, Cardiology Why:  Hospital Follow up CVA and left carotid artery disease Contact information: [place] [address] [phone]        Phillips Odor, MD. Schedule an appointment as soon as possible for a visit in 5 week(s).   Specialty:  Neurology Why:  Hospital Follow Up Stroke Contact information: [address] [place] [address] [phone]          Allergies  Allergen Reactions  . Nifedipine Other (See Comments)  . Erythromycin   . Lorazepam Other (See Comments)    REACTION: Alters mental status. "Turns  into maniac"  . Penicillins Hives    Has patient had a PCN reaction causing immediate rash, facial/tongue/throat swelling, SOB or lightheadedness with hypotension: Yes Has patient had a PCN reaction causing severe rash involving mucus membranes or skin necrosis: No Has patient had a PCN reaction that required hospitalization No Has patient had a PCN reaction occurring within the last  10 years: No If all of the above answers are "NO", then may proceed with Cephalosporin use.   . Sulfonamide Derivatives Other (See Comments)    REACTION: Unknown to patient. States that MD states allergy per medical records  . Statins Itching and Rash   Allergies as of 11/16/2017      Reactions   Nifedipine Other (See Comments)   Erythromycin    Lorazepam Other (See Comments)   REACTION: Alters mental status. "Turns into maniac"   Penicillins Hives   Has patient had a PCN reaction causing immediate rash, facial/tongue/throat swelling, SOB or lightheadedness with hypotension: Yes Has patient had a PCN reaction causing severe rash involving mucus membranes or skin necrosis: No Has patient had a PCN reaction that required hospitalization No Has patient had a PCN reaction occurring within the last 10 years: No If all of the above answers are "NO", then may proceed with Cephalosporin use.   Sulfonamide Derivatives Other (See Comments)   REACTION: Unknown to patient. States that MD states allergy per medical records   Statins Itching, Rash      Medication List    TAKE these medications   amLODipine 10 MG tablet Commonly known as:  NORVASC Take 1 tablet (10 mg total) by mouth daily.   aspirin 325 MG tablet Take 1 tablet (325 mg total) by mouth daily. What changed:    medication strength  how much to take  when to take this   bisacodyl 5 MG EC tablet Commonly known as:  DULCOLAX Take 5 mg by mouth daily as needed for moderate constipation.   clopidogrel 75 MG tablet Commonly known as:   PLAVIX Take 1 tablet (75 mg total) by mouth daily with breakfast.   ezetimibe 10 MG tablet Commonly known as:  ZETIA Take 1 tablet (10 mg total) by mouth daily.   fluvastatin XL 80 MG 24 hr tablet Commonly known as:  LESCOL XL Take 1 tablet (80 mg total) by mouth at bedtime.   metoprolol tartrate 25 MG tablet Commonly known as:  LOPRESSOR Take 0.5 tablets (12.5 mg total) by mouth 2 (two) times daily.   nitroGLYCERIN 0.4 MG SL tablet Commonly known as:  NITROSTAT Place 1 tablet (0.4 mg total) under the tongue every 5 (five) minutes as needed for chest pain.   OMEGA-3 FISH OIL PO Take 1 capsule by mouth daily.   omeprazole 20 MG capsule Commonly known as:  PRILOSEC Take 20 mg every evening by mouth.   PARoxetine 20 MG tablet Commonly known as:  PAXIL Take 20 mg by mouth at bedtime.   spironolactone 25 MG tablet Commonly known as:  ALDACTONE Take 25 mg every evening by mouth.       Procedures/Studies: Mr Virgel Paling JG Contrast  Result Date: 11/15/2017 CLINICAL DATA:  Numbness involving the left hand and face. Speech disturbance. EXAM: MRI HEAD WITHOUT CONTRAST MRA HEAD WITHOUT CONTRAST TECHNIQUE: Multiplanar, multiecho pulse sequences of the brain and surrounding structures were obtained without intravenous contrast. Angiographic images of the head were obtained using MRA technique without contrast. COMPARISON:  Head CT 11/14/2017, MRI 07/18/2015, and MRA 12/15/2006 FINDINGS: MRI HEAD FINDINGS Brain: Several small acute cortically based infarcts are present in the right parietal lobe and perirolandic region without associated hemorrhage. Small chronic infarcts in the right parietal cortex and right cerebellum are unchanged from 2016. Patchy and confluent T2 hyperintensities throughout the cerebral white matter and pons are similar to the prior MRI and nonspecific but compatible with extensive chronic  small vessel ischemic disease. There is mild-to-moderate generalized cerebral  atrophy. No mass, midline shift, or extra-axial fluid collection is seen. Vascular: Major intracranial vascular flow voids are preserved. Skull and upper cervical spine: Unremarkable bone marrow signal. Sinuses/Orbits: Bilateral cataract extraction. Paranasal sinuses and mastoid air cells are clear. Other: None. MRA HEAD FINDINGS The visualized distal vertebral arteries are patent to the basilar with the left being dominant. There is a severe mid right V4 stenosis, and there is a moderate proximal left V4 stenosis. PICAs and SCAs are patent bilaterally. The basilar artery is widely patent. There is a fetal origin of the right PCA, and there is also a small left posterior communicating artery. A moderate proximal left P2 stenosis is noted. There is chronic asymmetric attenuation of the distal right PCA. The internal carotid arteries are patent from skull base to carotid termini with evidence of mild anterior genu and proximal supraclinoid stenosis on the right. The MCAs are patent without evidence of proximal branch occlusion or M1 stenosis. Nonvisualization of the right A1 segment reflects interval occlusion from 2008. The anterior communicating artery is patent and supplies the right A2 segment from the left. The left A1 segment is widely patent. No aneurysm is identified. IMPRESSION: 1. Small acute right frontoparietal infarcts. 2. Extensive chronic small vessel ischemic disease. 3. Right A1 occlusion, new from 2008 though without an associated acute or chronic infarct. Right A2 reconstitution via the anterior communicating artery. 4. Moderate to severe posterior circulation stenoses as above. Electronically Signed   By: Logan Bores M.D.   On: 11/15/2017 08:32   Mr Brain Wo Contrast  Result Date: 11/15/2017 CLINICAL DATA:  Numbness involving the left hand and face. Speech disturbance. EXAM: MRI HEAD WITHOUT CONTRAST MRA HEAD WITHOUT CONTRAST TECHNIQUE: Multiplanar, multiecho pulse sequences of the brain and  surrounding structures were obtained without intravenous contrast. Angiographic images of the head were obtained using MRA technique without contrast. COMPARISON:  Head CT 11/14/2017, MRI 07/18/2015, and MRA 12/15/2006 FINDINGS: MRI HEAD FINDINGS Brain: Several small acute cortically based infarcts are present in the right parietal lobe and perirolandic region without associated hemorrhage. Small chronic infarcts in the right parietal cortex and right cerebellum are unchanged from 2016. Patchy and confluent T2 hyperintensities throughout the cerebral white matter and pons are similar to the prior MRI and nonspecific but compatible with extensive chronic small vessel ischemic disease. There is mild-to-moderate generalized cerebral atrophy. No mass, midline shift, or extra-axial fluid collection is seen. Vascular: Major intracranial vascular flow voids are preserved. Skull and upper cervical spine: Unremarkable bone marrow signal. Sinuses/Orbits: Bilateral cataract extraction. Paranasal sinuses and mastoid air cells are clear. Other: None. MRA HEAD FINDINGS The visualized distal vertebral arteries are patent to the basilar with the left being dominant. There is a severe mid right V4 stenosis, and there is a moderate proximal left V4 stenosis. PICAs and SCAs are patent bilaterally. The basilar artery is widely patent. There is a fetal origin of the right PCA, and there is also a small left posterior communicating artery. A moderate proximal left P2 stenosis is noted. There is chronic asymmetric attenuation of the distal right PCA. The internal carotid arteries are patent from skull base to carotid termini with evidence of mild anterior genu and proximal supraclinoid stenosis on the right. The MCAs are patent without evidence of proximal branch occlusion or M1 stenosis. Nonvisualization of the right A1 segment reflects interval occlusion from 2008. The anterior communicating artery is patent and supplies the right A2  segment from the left. The left A1 segment is widely patent. No aneurysm is identified. IMPRESSION: 1. Small acute right frontoparietal infarcts. 2. Extensive chronic small vessel ischemic disease. 3. Right A1 occlusion, new from 2008 though without an associated acute or chronic infarct. Right A2 reconstitution via the anterior communicating artery. 4. Moderate to severe posterior circulation stenoses as above. Electronically Signed   By: Logan Bores M.D.   On: 11/15/2017 08:32   US Carotid Bilateral  Result Date: 11/15/2017 CLINICAL DATA:  LEFT-SIDED NUMBNESS, DYSARTHRIA, DYSPHAGIA EXAM: BILATERAL CAROTID DUPLEX ULTRASOUND TECHNIQUE: Pearline Cables scale imaging, color Doppler and duplex ultrasound were performed of bilateral carotid and vertebral arteries in the neck. COMPARISON:  None. FINDINGS: Criteria: Quantification of carotid stenosis is based on velocity parameters that correlate the residual internal carotid diameter with NASCET-based stenosis levels, using the diameter of the distal internal carotid lumen as the denominator for stenosis measurement. The following velocity measurements were obtained: RIGHT ICA: 86/29 cm/sec CCA: 948/54 cm/sec SYSTOLIC ICA/CCA RATIO:  0.7 ECA:  284 cm/sec LEFT ICA: 171/45 cm/sec CCA: 627/03 cm/sec SYSTOLIC ICA/CCA RATIO:  1.2 ECA:  165 cm/sec RIGHT CAROTID ARTERY: moderate heterogeneous mixed echogenicity atherosclerosis. despite this, there is no hemodynamically significant right ica stenosis, velocity elevation, or turbulent flow. degree of narrowing estimated at less than 50% by ultrasound criteria. RIGHT VERTEBRAL ARTERY:  Antegrade LEFT CAROTID ARTERY: similar moderate heterogeneous partially calcified left carotid bifurcation atherosclerosis. proximal left ica mild velocity elevation measures 171/45 centimeters/second. no significant turbulent flow. degree of stenosis estimated at 50-69% (close to the 50% range). LEFT VERTEBRAL ARTERY:  Antegrade IMPRESSION: Moderate  bilateral carotid atherosclerosis. Right ICA narrowing less than 50% Left ICA moderate stenosis estimated at 50-69%. Patent antegrade vertebral flow bilaterally Electronically Signed   By: Jerilynn Mages.  Shick M.D.   On: 11/15/2017 09:40   Ct Head Code Stroke Wo Contrast  Result Date: 11/14/2017 CLINICAL DATA:  Code stroke.  LEFT-sided weakness. EXAM: CT HEAD WITHOUT CONTRAST TECHNIQUE: Contiguous axial images were obtained from the base of the skull through the vertex without intravenous contrast. COMPARISON:  06/08/2017. FINDINGS: Brain: No evidence for acute infarction, hemorrhage, mass lesion, hydrocephalus, or extra-axial fluid. Generalized atrophy. Extensive white matter disease. Vascular: Calcification of the cavernous internal carotid arteries consistent with cerebrovascular atherosclerotic disease. No signs of intracranial large vessel occlusion. Skull: Normal. Negative for fracture or focal lesion. Sinuses/Orbits: No acute finding. Other: None ASPECTS ([place] Stroke Program Early CT Score) - Ganglionic level infarction (caudate, lentiform nuclei, internal capsule, insula, M1-M3 cortex): 7 - Supraganglionic infarction (M4-M6 cortex): 3 Total score (0-10 with 10 being normal): 10 IMPRESSION: 1. Atrophy and small vessel disease similar to priors. No acute intracranial findings. 2. ASPECTS is 10. These results were called by telephone at the time of interpretation on 11/14/2017 at 6:09 pm to Dr. Aletta Edouard , who verbally acknowledged these results. Electronically Signed   By: Staci Righter M.D.   On: 11/14/2017 18:13     Subjective: Pt still having numbness left side of face.  Has been eating and drinking well.  He has been ambulating well.    Discharge Exam: Vitals:   11/15/17 2137 11/16/17 0626  BP: (!) 146/81 135/60  Pulse: (!) 58 (!) 56  Resp:    Temp: 98.5 F (36.9 C) 98.3 F (36.8 C)  SpO2: 98% 96%   Vitals:   11/15/17 0607 11/15/17 1506 11/15/17 2137 11/16/17 0626  BP: (!) 127/59 136/87  (!) 146/81 135/60  Pulse: 64 (!) 59 (!) 58 Marland Kitchen)  56  Resp:  18    Temp: 98.1 F (36.7 C) 98.1 F (36.7 C) 98.5 F (36.9 C) 98.3 F (36.8 C)  TempSrc: Oral Oral Oral Oral  SpO2: 97% 97% 98% 96%  Weight:      Height:        General: Pt is alert, awake, not in acute distress Cardiovascular: RRR, S1/S2 +, no rubs, no gallops Respiratory: CTA bilaterally, no wheezing, no rhonchi Abdominal: Soft, NT, ND, bowel sounds + Extremities: no edema, no cyanosis   The results of significant diagnostics from this hospitalization (including imaging, microbiology, ancillary and laboratory) are listed below for reference.     Microbiology: No results found for this or any previous visit (from the past 240 hour(s)).   Labs: BNP (last 3 results) No results for input(s): BNP in the last 8760 hours. Basic Metabolic Panel: Recent Labs  Lab 11/14/17 1757 11/14/17 1810 11/15/17 0714  NA 136 139 138  K 4.1 4.3 4.0  CL 100* 101 102  CO2 25  --  27  GLUCOSE 102* 98 116*  BUN 19 19 21*  CREATININE 1.47* 1.50* 1.22  CALCIUM 9.2  --  9.0   Liver Function Tests: Recent Labs  Lab 11/14/17 1757  AST 24  ALT 26  ALKPHOS 113  BILITOT 0.6  PROT 7.6  ALBUMIN 4.1   No results for input(s): LIPASE, AMYLASE in the last 168 hours. No results for input(s): AMMONIA in the last 168 hours. CBC: Recent Labs  Lab 11/14/17 1757 11/14/17 1810 11/15/17 0714  WBC 9.6  --  7.8  NEUTROABS 6.6  --   --   HGB 13.7 14.3 12.5*  HCT 40.8 42.0 38.0*  MCV 95.3  --  96.4  PLT 241  --  196   Cardiac Enzymes: No results for input(s): CKTOTAL, CKMB, CKMBINDEX, TROPONINI in the last 168 hours. BNP: Invalid input(s): POCBNP CBG: Recent Labs  Lab 11/14/17 1821  GLUCAP 149*   D-Dimer No results for input(s): DDIMER in the last 72 hours. Hgb A1c Recent Labs    11/14/17 1924  HGBA1C 5.9*   Lipid Profile Recent Labs    11/15/17 0714  CHOL 185  HDL 29*  LDLCALC UNABLE TO CALCULATE IF TRIGLYCERIDE  OVER 400 mg/dL  TRIG 439*  CHOLHDL 6.4   Thyroid function studies Recent Labs    11/14/17 1924  TSH 3.910   Anemia work up No results for input(s): VITAMINB12, FOLATE, FERRITIN, TIBC, IRON, RETICCTPCT in the last 72 hours. Urinalysis    Component Value Date/Time   LABSPEC 1.018 06/01/2010 1113   PHURINE 6.5 06/01/2010 1113   GLUCOSEU neg 06/01/2010 1113   PROTEINUR neg 06/01/2010 1113   NITRITE neg 06/01/2010 1113   Sepsis Labs Invalid input(s): PROCALCITONIN,  WBC,  LACTICIDVEN Microbiology No results found for this or any previous visit (from the past 240 hour(s)).  Time coordinating discharge: 36 mins  SIGNED:  Irwin Brakeman, MD  [facility] 11/16/2017, 9:15 AM Pager [phone]  If 7PM-7AM, please contact night-coverage [email or web address] Password [email or web address]

## 2017-11-16 NOTE — Discharge Instructions (Signed)
Please continue to take aspirin 325 mg and plavix together for at least 90 days (3 months)   Follow with Primary MD  [name], MD  and other consultant's as instructed your Hospitalist MD  Please get a complete blood count and chemistry panel checked by your Primary MD at your next visit, and again as instructed by your Primary MD.  Get Medicines reviewed and adjusted: Please take all your medications with you for your next visit with your Primary MD  Laboratory/radiological data: Please request your Primary MD to go over all hospital tests and procedure/radiological results at the follow up, please ask your Primary MD to get all Hospital records sent to his/her office.  In some cases, they will be blood work, cultures and biopsy results pending at the time of your discharge. Please request that your primary care M.D. follows up on these results.  Also Note the following: If you experience worsening of your admission symptoms, develop shortness of breath, life threatening emergency, suicidal or homicidal thoughts you must seek medical attention immediately by calling 911 or calling your MD immediately  if symptoms less severe.  You must read complete instructions/literature along with all the possible adverse reactions/side effects for all the Medicines you take and that have been prescribed to you. Take any new Medicines after you have completely understood and accpet all the possible adverse reactions/side effects.   Do not drive when taking Pain medications or sleeping medications (Benzodaizepines)  Do not take more than prescribed Pain, Sleep and Anxiety Medications. It is not advisable to combine anxiety,sleep and pain medications without talking with your primary care practitioner  Special Instructions: If you have smoked or chewed Tobacco  in the last 2 yrs please stop smoking, stop any regular Alcohol  and or any Recreational drug use.  Wear Seat belts while driving.  Please  note: You were cared for by a hospitalist during your hospital stay. Once you are discharged, your primary care physician will handle any further medical issues. Please note that NO REFILLS for any discharge medications will be authorized once you are discharged, as it is imperative that you return to your primary care physician (or establish a relationship with a primary care physician if you do not have one) for your post hospital discharge needs so that they can reassess your need for medications and monitor your lab values.      Aspirin and Your Heart Aspirin is a medicine that affects the way blood clots. Aspirin can be used to help reduce the risk of blood clots, heart attacks, and other heart-related problems. Should I take aspirin? Your health care provider will help you determine whether it is safe and beneficial for you to take aspirin daily. Taking aspirin daily may be beneficial if you:  Have had a heart attack or chest pain.  Have undergone open heart surgery such as coronary artery bypass surgery (CABG).  Have had coronary angioplasty.  Have experienced a stroke or transient ischemic attack (TIA).  Have peripheral vascular disease (PVD).  Have chronic heart rhythm problems such as atrial fibrillation.  Are there any risks of taking aspirin daily? Daily use of aspirin can increase your risk of side effects. Some of these include:  Bleeding. Bleeding problems can be minor or serious. An example of a minor problem is a cut that does not stop bleeding. An example of a more serious problem is stomach bleeding or bleeding into the brain. Your risk of bleeding is increased if you  are also taking non-steroidal anti-inflammatory medicine (NSAIDs).  Increased bruising.  Upset stomach.  An allergic reaction. People who have nasal polyps have an increased risk of developing an aspirin allergy.  What are some guidelines I should follow when taking aspirin?  Take aspirin only as  directed by your health care provider. Make sure you understand how much you should take and what form you should take. The two forms of aspirin are: ? Non-enteric-coated. This type of aspirin does not have a coating and is absorbed quickly. Non-enteric-coated aspirin is usually recommended for people with chest pain. This type of aspirin also comes in a chewable form. ? Enteric-coated. This type of aspirin has a special coating that releases the medicine very slowly. Enteric-coated aspirin causes less stomach upset than non-enteric-coated aspirin. This type of aspirin should not be chewed or crushed.  Drink alcohol in moderation. Drinking alcohol increases your risk of bleeding. When should I seek medical care?  You have unusual bleeding or bruising.  You have stomach pain.  You have an allergic reaction. Symptoms of an allergic reaction include: ? Hives. ? Itchy skin. ? Swelling of the lips, tongue, or face.  You have ringing in your ears. When should I seek immediate medical care?  Your bowel movements are bloody, dark red, or black in color.  You vomit or cough up blood.  You have blood in your urine.  You cough, wheeze, or feel short of breath. If you have any of the following symptoms, this is an emergency. Do not wait to see if the pain will go away. Get medical help at once. Call your local emergency services (911 in the [place] Do not drive yourself to the hospital.  You have severe chest pain, especially if the pain is crushing or pressure-like and spreads to the arms, back, neck, or jaw.  You have stroke-like symptoms, such as: ? Loss of vision. ? Difficulty talking. ? Numbness or weakness on one side of your body. ? Numbness or weakness in your arm or leg. ? Not thinking clearly or feeling confused.  This information is not intended to replace advice given to you by your health care provider. Make sure you discuss any questions you have with your health care  provider. Document Released: 06/24/2008 Document Revised: 11/19/2015 Document Reviewed: 10/17/2013 Elsevier Interactive Patient Education  2018 [employer].  Ischemic Stroke An ischemic stroke is the sudden death of brain tissue. Blood carries oxygen to all areas of the body. This type of stroke happens when your blood does not flow to your brain like normal. Your brain cannot get the oxygen it needs. This is an emergency. It must be treated right away. Symptoms of a stroke usually happen all of a sudden. You may notice them when you wake up. They can include:  Weakness or loss of feeling in your face, arm, or leg. This often happens on one side of the body.  Trouble walking.  Trouble moving your arms or legs.  Loss of balance or coordination.  Feeling confused.  Trouble talking or understanding what people are saying.  Slurred speech.  Trouble seeing.  Seeing two of one object (double vision).  Feeling dizzy.  Feeling sick to your stomach (nauseous) and throwing up (vomiting).  A very bad headache for no reason.  Get help as soon as any of these problems start. This is important. Some treatments work better if they are given right away. These include:  Aspirin.  Medicines to control blood pressure.  A shot (injection) of medicine to break up the blood clot.  Treatments given in the blood vessel (artery) to take out the clot or break it up.  Other treatments may include:  Oxygen.  Fluids given through an IV tube.  Medicines to thin out your blood.  Procedures to help your blood flow better.  What increases the risk? Certain things may make you more likely to have a stroke. Some of these are things that you can change, such as:  Being very overweight (obesity).  Smoking.  Taking birth control pills.  Not being active.  Drinking too much alcohol.  Using drugs.  Other risk factors include:  High blood pressure.  High  cholesterol.  Diabetes.  Heart disease.  Being [place] American, Native American, Hispanic, or [place] Native.  Being over age 33.  Family history of stroke.  Having had blood clots, stroke, or warning stroke (transient ischemic attack, TIA) in the past.  Sickle cell disease.  Being a woman with a history of high blood pressure in pregnancy (preeclampsia).  Migraine headache.  Sleep apnea.  Having an irregular heartbeat (atrial fibrillation).  Long-term (chronic) diseases that cause soreness and swelling (inflammation).  Disorders that affect how your blood clots.  Follow these instructions at home: Medicines  Take over-the-counter and prescription medicines only as told by your doctor.  If you were told to take aspirin or another medicine to thin your blood, take it exactly as told by your doctor. ? Taking too much of the medicine can cause bleeding. ? If you do not take enough, it may not work as well.  Know the side effects of your medicines. If you are taking a blood thinner, make sure you: ? Hold pressure over any cuts for longer than usual. ? Tell your dentist and other doctors that you take this medicine. ? Avoid activities that may cause damage or injury to your body. Eating and drinking  Follow instructions from your doctor about what you cannot eat or drink.  Eat healthy foods.  If you have trouble with swallowing, do these things to avoid choking: ? Take small bites when eating. ? Eat foods that are soft or pureed. Safety  Follow instructions from your health care team about physical activity.  Use a walker or cane as told by your doctor.  Keep your home safe so you do not fall. This may include: ? Having experts look at your home to make sure it is safe. ? Putting grab bars in the bedroom and bathroom. ? Using raised toilets. ? Putting a seat in the shower. General instructions  Do not use any tobacco products. ? Examples of these are  cigarettes, chewing tobacco, and e-cigarettes. ? If you need help quitting, ask your doctor.  Limit how much alcohol you drink. This means no more than 1 drink a day for nonpregnant women and 2 drinks a day for men. One drink equals 12 oz of beer, 5 oz of wine, or 1 oz of hard liquor.  If you need help to stop using drugs or alcohol, ask your doctor to refer you to a program or specialist.  Stay active. Exercise as told by your doctor.  Keep all follow-up visits as told by your doctor. This is important. Get help right away if:  You suddenly: ? Have weakness or loss of feeling in your face, arm, or leg. ? Feel confused. ? Have trouble talking or understanding what people are saying. ? Have trouble seeing. ? Have  trouble walking. ? Have trouble moving your arms or legs. ? Feel dizzy. ? Lose your balance or coordination. ? Have a very bad headache and you do not know why.  You pass out (lose consciousness) or almost pass out.  You have jerky movements that you cannot control (seizure). These symptoms may be an emergency. Do not wait to see if the symptoms will go away. Get medical help right away. Call your local emergency services (911 in the [place] Do not drive yourself to the hospital. This information is not intended to replace advice given to you by your health care provider. Make sure you discuss any questions you have with your health care provider. Document Released: 07/01/2011 Document Revised: 12/23/2015 Document Reviewed: 10/08/2015 Elsevier Interactive Patient Education  2018 [facility] Discharge After a Stroke  Being discharged from the hospital after a stroke can feel overwhelming. Many things may be different, and it is normal to feel scared or anxious. Some stroke survivors may be able to return to their homes, and others may need more specialized care on a temporary or permanent basis. Your stroke care team will work with you to develop a discharge  plan that is best for you. Ask questions if you do not understand something. Invite a friend or family member to participate in discharge planning. Understanding and following your discharge plan can help to prevent another stroke or other problems. Understanding your medicines After a stroke, your health care provider may prescribe one or more types of medicine. It is important to take medicines exactly as told by your health care provider. Serious harm, such as another stroke, can happen if you are unable to take your medicine exactly as prescribed. Make sure you understand:  What medicine to take.  Why you are taking the medicine.  How and when to take it.  If it can be taken with your other medicines and herbal supplements.  Possible side effects.  When to call your health care provider if you have any side effects.  How you will get and pay for your medicines. Medical assistance programs may be able to help you pay for prescription medicines if you cannot afford them.  If you are taking an anticoagulant, be sure to take it exactly as told by your health care provider. This type of medicine can increase the risk of bleeding because it works to prevent blood from clotting. You may need to take certain precautions to prevent bleeding. You should contact your health care provider if you have:  Bleeding or bruising.  A fall or other injury to your head.  Blood in your urine or stool (feces).  Planning for home safety Take steps to prevent falls, such as installing grab bars or using a shower chair. Ask a friend or family member to get needed things in place before you go home if possible. A therapist can come to your home to make recommendations for safety equipment. Ask your health care provider if you would benefit from this service or from home care. Getting needed equipment Ask your health care provider for a list of any medical equipment and supplies you will need at home. These may  include items such as:  Walkers.  Canes.  Wheelchairs.  Hand-strengthening devices.  Special eating utensils.  Medical equipment can be rented or purchased, depending on your insurance coverage. Check with your insurance company about what is covered. Keeping follow-up visits After a stroke, you will need to follow up  regularly with a health care provider. You may also need rehabilitation, which can include physical therapy, occupational therapy, or speech-language therapy. Keeping these appointments is very important to your recovery after a stroke. Be sure to bring your medicine list and discharge papers with you to your appointments. If you need help to keep track of your schedule, use a calendar or appointment reminder. Preventing another stroke Having a stroke puts you at risk for another stroke in the future. Ask your health care provider what actions you can take to lower the risk. These may include:  Increasing how much you exercise.  Making a healthy eating plan.  Quitting smoking.  Managing other health conditions, such as high blood pressure, high cholesterol, or diabetes.  Limiting alcohol use.  Knowing the warning signs of a stroke Make sure you understand the signs of a stroke. Before you leave the hospital, you will receive information outlining the stroke warning signs. Share these with your friends and family members. "BE FAST" is an easy way to remember the main warning signs of a stroke:  B - Balance. Signs are dizziness, sudden trouble walking, or loss of balance.  E - Eyes. Signs are trouble seeing or a sudden change in vision.  F - Face. Signs are sudden weakness or numbness of the face, or the face or eyelid drooping on one side.  A - Arms. Signs are weakness or numbness in an arm. This happens suddenly and usually on one side of the body.  S - Speech. Signs are sudden trouble speaking, slurred speech, or trouble understanding what people say.  T -  Time. Time to call emergency services. Write down what time symptoms started.  Other signs of stroke may include:  A sudden, severe headache with no known cause.  Nausea or vomiting.  Seizure.  These symptoms may represent a serious problem that is an emergency. Do not wait to see if the symptoms will go away. Get medical help right away. Call your local emergency services (911 in the [place] Do not drive yourself to the hospital. Make note of the time that you had your first symptoms. Your emergency responders or emergency room staff will need to know this information. Summary  Being discharged from the hospital after a stroke can feel overwhelming. It is normal to feel scared or anxious.  Make sure you take medicines exactly as told by your health care provider.  Know the warning signs of a stroke, and get help right way if you have any of these symptoms. "BE FAST" is an easy way to remember the main warning signs of a stroke. This information is not intended to replace advice given to you by your health care provider. Make sure you discuss any questions you have with your health care provider. Document Released: 10/15/2016 Document Revised: 10/15/2016 Document Reviewed: 10/15/2016 Elsevier Interactive Patient Education  2018 [place].   Carotid Artery Disease The carotid arteries are arteries on both sides of the neck. They carry blood to the brain. Carotid artery disease is when the arteries get smaller (narrow) or get blocked. If these arteries get smaller or get blocked, you are more likely to have a stroke or warning stroke (transient ischemic attack). Follow these instructions at home:  Take medicines as told by your doctor. Make sure you understand all your medicine instructions. Do not stop your medicines without talking to your doctor first.  Follow your doctor's diet instructions. It is important to eat a healthy diet that  includes plenty of: ? Fresh  fruits. ? Vegetables. ? Lean meats.  Avoid: ? High-fat foods. ? High-sodium foods. ? Foods that are fried, overly processed, or have poor nutritional value.  Stay a healthy weight.  Stay active. Get at least 30 minutes of activity every day.  Do not smoke.  Limit alcohol use to: ? No more than 2 drinks a day for men. ? No more than 1 drink a day for women who are not pregnant.  Do not use illegal drugs.  Keep all doctor visits as told. Get help right away if:  You have sudden weakness or loss of feeling (numbness) on one side of the body, such as the face, arm, or leg.  You have sudden confusion.  You have trouble speaking (aphasia) or understanding.  You have sudden trouble seeing out of one or both eyes.  You have sudden trouble walking.  You have dizziness or feel like you might pass out (faint).  You have a loss of balance or your movements are not steady (uncoordinated).  You have a sudden, severe headache with no known cause.  You have trouble swallowing (dysphagia). Call your local emergency services (911 in [place] Do notdrive yourself to the clinic or hospital. This information is not intended to replace advice given to you by your health care provider. Make sure you discuss any questions you have with your health care provider. Document Released: 06/28/2012 Document Revised: 12/18/2015 Document Reviewed: 01/10/2013 Elsevier Interactive Patient Education  2018 [employer].   Stroke Prevention Some health problems and behaviors may make it more likely for you to have a stroke. Below are ways to lessen your risk of having a stroke.  Be active for at least 30 minutes on most or all days.  Do not smoke. Try not to be around others who smoke.  Do not drink too much alcohol. ? Do not have more than 2 drinks a day if you are a man. ? Do not have more than 1 drink a day if you are a woman and are not pregnant.  Eat healthy foods, such as fruits and  vegetables. If you were put on a specific diet, follow the diet as told.  Keep your cholesterol levels under control through diet and medicines. Look for foods that are low in saturated fat, trans fat, cholesterol, and are high in fiber.  If you have diabetes, follow all diet plans and take your medicine as told.  Ask your doctor if you need treatment to lower your blood pressure. If you have high blood pressure (hypertension), follow all diet plans and take your medicine as told by your doctor.  If you are 40-1 years old, have your blood pressure checked every 3-5 years. If you are age 72 or older, have your blood pressure checked every year.  Keep a healthy weight. Eat foods that are low in calories, salt, saturated fat, trans fat, and cholesterol.  Do not take drugs.  Avoid birth control pills, if this applies. Talk to your doctor about the risks of taking birth control pills.  Talk to your doctor if you have sleep problems (sleep apnea).  Take all medicine as told by your doctor. ? You may be told to take aspirin or blood thinner medicine. Take this medicine as told by your doctor. ? Understand your medicine instructions.  Make sure any other conditions you have are being taken care of.  Get help right away if:  You suddenly lose feeling (you feel  numb) or have weakness in your face, arm, or leg.  Your face or eyelid hangs down to one side.  You suddenly feel confused.  You have trouble talking (aphasia) or understanding what people are saying.  You suddenly have trouble seeing in one or both eyes.  You suddenly have trouble walking.  You are dizzy.  You lose your balance or your movements are clumsy (uncoordinated).  You suddenly have a very bad headache and you do not know the cause.  You have new chest pain.  Your heart feels like it is fluttering or skipping a beat (irregular heartbeat). Do not wait to see if the symptoms above go away. Get help right away.  Call your local emergency services (911 in [place] Do not drive yourself to the hospital. This information is not intended to replace advice given to you by your health care provider. Make sure you discuss any questions you have with your health care provider. Document Released: 01/11/2012 Document Revised: 12/18/2015 Document Reviewed: 01/12/2013 Elsevier Interactive Patient Education  [employer].

## 2017-11-16 NOTE — Progress Notes (Signed)
11/16/2017 5:48 PM  I received communication from Dr. Merlene Laughter about 30 day event monitor.  I left a message for Lukisha Pinnix with [facility] for 30 day event monitor. Will send to patient's home.   Murvin Natal MD

## 2017-11-16 NOTE — Progress Notes (Cosign Needed)
Called [facility] to get an appointment for Mr. Delorse Lek with Dr. Oneida Alar or with NP Vinnie Level Nickel. Office to call back today at [place].

## 2017-11-21 ENCOUNTER — Ambulatory Visit (HOSPITAL_COMMUNITY)
Admission: RE | Admit: 2017-11-21 | Discharge: 2017-11-21 | Disposition: A | Discharge status: Home / Self Care | Payer: Medicare HMO | Source: Ambulatory Visit | Attending: Internal Medicine | Admitting: Internal Medicine

## 2017-11-21 ENCOUNTER — Other Ambulatory Visit (HOSPITAL_COMMUNITY): Payer: Self-pay | Admitting: Internal Medicine

## 2017-11-21 DIAGNOSIS — R059 Cough, unspecified: Secondary | ICD-10-CM

## 2017-11-21 DIAGNOSIS — R0602 Shortness of breath: Secondary | ICD-10-CM | POA: Diagnosis not present

## 2017-11-21 DIAGNOSIS — R05 Cough: Secondary | ICD-10-CM | POA: Diagnosis not present

## 2017-11-21 DIAGNOSIS — Z6828 Body mass index (BMI) 28.0-28.9, adult: Secondary | ICD-10-CM | POA: Diagnosis not present

## 2017-11-21 DIAGNOSIS — Z8673 Personal history of transient ischemic attack (TIA), and cerebral infarction without residual deficits: Secondary | ICD-10-CM | POA: Diagnosis not present

## 2017-11-23 ENCOUNTER — Telehealth: Payer: Self-pay | Admitting: *Deleted

## 2017-11-23 DIAGNOSIS — I498 Other specified cardiac arrhythmias: Secondary | ICD-10-CM

## 2017-11-23 NOTE — Telephone Encounter (Signed)
-----   Message from Murlean Iba, MD sent at 11/16/2017  5:45 PM EDT ----- Regarding: 30 day event monitor  Please set this patient up for a 30 day event monitor as part of his stroke work up.  You can send to his home.  Thanks for your help.   Murvin Natal MD

## 2017-11-26 ENCOUNTER — Ambulatory Visit (INDEPENDENT_AMBULATORY_CARE_PROVIDER_SITE_OTHER): Payer: Medicare HMO

## 2017-11-26 DIAGNOSIS — I498 Other specified cardiac arrhythmias: Secondary | ICD-10-CM | POA: Diagnosis not present

## 2017-11-26 DIAGNOSIS — I499 Cardiac arrhythmia, unspecified: Secondary | ICD-10-CM | POA: Diagnosis not present

## 2017-11-26 DIAGNOSIS — R001 Bradycardia, unspecified: Secondary | ICD-10-CM | POA: Diagnosis not present

## 2017-12-09 ENCOUNTER — Telehealth: Payer: Self-pay | Admitting: *Deleted

## 2017-12-09 NOTE — Telephone Encounter (Signed)
Spoke with wife who states that pt has increased fatigue in legs. Wife denies legs swelling and increased SOB.  Pt encouraged to call PCP office. Please advise.

## 2017-12-12 NOTE — Telephone Encounter (Signed)
Agree patient needs pcp evaluation for his leg fatigue, this does not sound like a primary cardiac issue  Zandra Abts MD

## 2017-12-13 ENCOUNTER — Telehealth: Payer: Self-pay | Admitting: Cardiology

## 2017-12-13 NOTE — Telephone Encounter (Signed)
Patient called back, states he wants an apt to discuss his heart with Dr.Branch and he is still wearing his monitor. States he has not seen his pcp for his leg weakness as suggested in  12/09/17 phone call. Apt made 12/26/17 with Dr.Branch

## 2017-12-13 NOTE — Telephone Encounter (Signed)
Needs to speak w/ someone about his monitor. Pt is having weakness and can't walk around his house

## 2017-12-13 NOTE — Telephone Encounter (Signed)
Patient notified

## 2017-12-13 NOTE — Telephone Encounter (Signed)
Attempt to reach, got voicemail,[id number]

## 2017-12-21 ENCOUNTER — Ambulatory Visit (INDEPENDENT_AMBULATORY_CARE_PROVIDER_SITE_OTHER): Payer: Medicare HMO | Admitting: Vascular Surgery

## 2017-12-21 ENCOUNTER — Other Ambulatory Visit: Payer: Self-pay

## 2017-12-21 ENCOUNTER — Encounter: Payer: Self-pay | Admitting: Vascular Surgery

## 2017-12-21 VITALS — BP 134/83 | HR 54 | Temp 97.7°F | Resp 20 | Ht 66.0 in | Wt 176.0 lb

## 2017-12-21 DIAGNOSIS — I639 Cerebral infarction, unspecified: Secondary | ICD-10-CM | POA: Diagnosis not present

## 2017-12-21 DIAGNOSIS — I6523 Occlusion and stenosis of bilateral carotid arteries: Secondary | ICD-10-CM | POA: Diagnosis not present

## 2017-12-21 NOTE — Progress Notes (Signed)
Established Carotid Patient   History of Present Illness   Kenneth [name] is a 77 y.o. (February 23, 1941) male s/p R carotid stenting (06/13/12) who presents with chief complaint: recent R CVA.  Pt was admitted on 11/14/17 after a CVA.  Per family, patient began having difficulty speech and was draining saliva from corner of his mouth.    Previous carotid studies demonstrated: RICA 4-09% stenosis, LICA 81-19% stenosis.  Patient has known history of stroke.  The patient has never had amaurosis fugax or monocular blindness but he has had L ptosis.  The patient has had facial drooping or hemiplegia but had an episode of L arm weakness that resolved.  The patient has had expressive aphasia but no receptive aphasia.  The patient's previous neurologic deficits have resolved.    This patient has previous laryngeal surgery and adjuvant XRT.  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  Current Outpatient Medications  Medication Sig Dispense Refill  . aspirin 325 MG tablet Take 1 tablet (325 mg total) by mouth daily.    . metoprolol tartrate (LOPRESSOR) 25 MG tablet Take 0.5 tablets (12.5 mg total) by mouth 2 (two) times daily. 180 tablet 3  . nitroGLYCERIN (NITROSTAT) 0.4 MG SL tablet Place 1 tablet (0.4 mg total) under the tongue every 5 (five) minutes as needed for chest pain. 25 tablet 3  . Omega-3 Fatty Acids (OMEGA-3 FISH OIL PO) Take 1 capsule by mouth daily.    Marland Kitchen omeprazole (PRILOSEC) 20 MG capsule Take 20 mg every evening by mouth.     Marland Kitchen PARoxetine (PAXIL) 20 MG tablet Take 20 mg by mouth at bedtime.     Marland Kitchen spironolactone (ALDACTONE) 25 MG tablet Take 25 mg every evening by mouth.    Marland Kitchen amLODipine (NORVASC) 10 MG tablet Take 1 tablet (10 mg total) by mouth daily. 180 tablet 3  . bisacodyl (DULCOLAX) 5 MG EC tablet Take 5 mg by mouth daily as needed for moderate  constipation.    Marland Kitchen doxycycline (VIBRAMYCIN) 100 MG capsule   0  . ezetimibe (ZETIA) 10 MG tablet Take 1 tablet (10 mg total) by mouth daily. 90 tablet 3  . fluvastatin XL (LESCOL XL) 80 MG 24 hr tablet Take 1 tablet (80 mg total) by mouth at bedtime. 30 tablet 0   No current facility-administered medications for this visit.     On ROS today: +upper airway constriction, resolution of neuro sx   Physical Examination   Vitals:   12/21/17 0840 12/21/17 0844 12/21/17 0846  BP: (!) 143/82 131/80 134/83  Pulse: (!) 54 (!) 54 (!) 54  Resp: 20    Temp: 97.7 F (36.5 C)    TempSrc: Oral    SpO2: 96%    Weight: 176 lb (79.8 kg)    Height: 5\' 6"  (1.676 m)     Body mass index is 28.41 kg/m.  General Alert, O x 3, WD, NAD, wheezy reedy voice  Neck Supple, mid-line trachea, obvious post-op changes  Pulmonary Sym exp, good B air movt, CTA B, upper airway sounds  Cardiac RRR, Nl S1, S2, no Murmurs, No rubs, No S3,S4  Vascular Vessel Right Left  Radial Palpable Palpable  Brachial Palpable Palpable  Carotid Palpable, No Bruit Palpable, No Bruit  Aorta Not palpable N/A  Femoral Palpable Palpable  Popliteal Not palpable Not palpable  PT Not palpable  Not palpable  DP Faintly palpable Faintly palpable    Gastro- intestinal soft, non-distended, non-tender to palpation, No guarding or rebound, no HSM, no masses, no CVAT B, No palpable prominent aortic pulse,    Musculo- skeletal M/S 5/5 throughout  , Extremities without ischemic changes    Neurologic Cranial nerves 2-12 intact , Pain and light touch intact in extremities , Motor exam as listed above   Radiology     Outside B carotid duplex (11/15/17): Moderate bilateral carotid atherosclerosis.  Right ICA narrowing less than 50%  Left ICA moderate stenosis estimated at 50-69%.  Patent antegrade vertebral flow bilaterally  Immediately, I notice that no mention of the right carotid stent.  Usually a carotid stent causes the  velocities to elevate which is not documented here, raising some concerns with the accuracy of the duplex.   MRI/MRA Head (11/15/17) IMPRESSION: 1. Small acute right frontoparietal infarcts. 2. Extensive chronic small vessel ischemic disease. 3. Right A1 occlusion, new from 2008 though without an associated acute or chronic infarct. Right A2 reconstitution via the anterior communicating artery. 4. Moderate to severe posterior circulation stenoses as above.   Medical Decision Making   [name] is a 77 y.o. male who presents with: s/p R carotid stenting for sx R ICA >80%, recent and previous R CVA, asx L ICA stenosis 40-59%   I would repeat CTA Neck given the concerns with the B carotid duplex.  Patient will follow up in 2-4 weeks with that study.  Holter monitor reportedly ordered.  Results pending.  I discussed in depth with the patient the nature of atherosclerosis, and emphasized the importance of maximal medical management including strict control of blood pressure, blood glucose, and lipid levels, antiplatelet agents, obtaining regular exercise, and cessation of smoking.    The patient is aware that without maximal medical management the underlying atherosclerotic disease process will progress, limiting the benefit of any interventions.  The patient is currently not on a statin due to reported allergy.   Pt is on Zetia currently  The patient is currently on an anti-platelet: ASA.  Thank you for allowing [place] to participate in this patient's care.   Adele Barthel, MD, FACS [facility] Office: [phone] Pager: [phone]

## 2017-12-21 NOTE — Progress Notes (Signed)
Vitals:   12/21/17 0840 12/21/17 0844  BP: (!) 143/82 131/80  Pulse: (!) 54 (!) 54  Resp: 20   Temp: 97.7 F (36.5 C)   TempSrc: Oral   SpO2: 96%   Weight: 176 lb (79.8 kg)   Height: 5\' 6"  (1.676 m)

## 2017-12-22 ENCOUNTER — Other Ambulatory Visit: Payer: Self-pay

## 2017-12-22 DIAGNOSIS — I6523 Occlusion and stenosis of bilateral carotid arteries: Secondary | ICD-10-CM

## 2017-12-22 DIAGNOSIS — I639 Cerebral infarction, unspecified: Secondary | ICD-10-CM

## 2017-12-22 DIAGNOSIS — Z01812 Encounter for preprocedural laboratory examination: Secondary | ICD-10-CM

## 2017-12-26 ENCOUNTER — Encounter: Payer: Self-pay | Admitting: *Deleted

## 2017-12-26 ENCOUNTER — Ambulatory Visit: Payer: Medicare HMO | Admitting: Cardiology

## 2017-12-26 ENCOUNTER — Encounter: Payer: Self-pay | Admitting: Cardiology

## 2017-12-26 VITALS — BP 140/82 | HR 62 | Ht 66.0 in | Wt 179.2 lb

## 2017-12-26 DIAGNOSIS — R001 Bradycardia, unspecified: Secondary | ICD-10-CM | POA: Diagnosis not present

## 2017-12-26 DIAGNOSIS — E782 Mixed hyperlipidemia: Secondary | ICD-10-CM | POA: Diagnosis not present

## 2017-12-26 DIAGNOSIS — R0602 Shortness of breath: Secondary | ICD-10-CM | POA: Diagnosis not present

## 2017-12-26 DIAGNOSIS — I10 Essential (primary) hypertension: Secondary | ICD-10-CM

## 2017-12-26 DIAGNOSIS — R079 Chest pain, unspecified: Secondary | ICD-10-CM | POA: Diagnosis not present

## 2017-12-26 DIAGNOSIS — I2510 Atherosclerotic heart disease of native coronary artery without angina pectoris: Secondary | ICD-10-CM | POA: Diagnosis not present

## 2017-12-26 MED ORDER — CLOPIDOGREL BISULFATE 75 MG PO TABS: 75.0000 mg | ORAL_TABLET | Freq: Every day | ORAL | 3 refills | Status: DC

## 2017-12-26 NOTE — Progress Notes (Signed)
Clinical Summary Kenneth [name] is a 77 y.o.male seen today for follow up of the following medical problems.   1. CAD  - prior CABG in 2003, repeat cath 2007 with patent grafts.  - 07/2011 MPI low risk study, no ischemia.  - echo 04/2012 LVEF 65-60%, grade II diastolic dysfunction  - 01/785 echo showed LVEF 65-70%, grade II diastolic dysfunction.  - 11/2013 MPI no ischemia.  - 10/2017 echo LVEF 60-65%, no WMAs, normal diastolic function   - no recent chest pain. No new SOB.  - SOB progressing x 6 months. No recent edema. Dry cough x several week.  - has had some chest tightness. Entire chest, Mainly occurs at rest. 4-5/10 in severity. Not positional. Can last to up to 1 hr. 2 episodes total, last one few days ago.    2. Hyperlipidemia  - reported history of rash on statin, he reports tried multiple and all caused rash  - 05/2017 TC 199 TG 212 HDL 35 LDL 122 - he is on zetia   3. HTN  - compliant with meds  4. Carotid stenosis  - prior carotid stenting  - followed by vascular  5. SOB - remote 10-15 year history of smoking. Used to work in [place], mainly with walking up incline.   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   6. Bradycardia - notes some HRs in 40s at times. Denies any symptoms.   7. Throat cancer  s/p radiation and surgery - followed at [facility]   8. CVA - admit 10/2016 with small cortical based infarcts in right parietal lobe, associated hemorrhage - right ICA 50-69% 10/2017   Past Medical History:  Diagnosis Date  . Anxiety   . ASCVD (arteriosclerotic cardiovascular disease)   . Coronary artery disease    a. 2003: s/p CABG x3V  b. 2007: cath with patent bypass grafts.  c. 09/2016: [place] with cath showing patent bypass grafts, medical Rx recommended  . Depression   .  GERD (gastroesophageal reflux disease)   . High triglycerides   . Hypertension   . 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 Nephrolithiasis   . Peripheral vascular disease ([place])   . Stroke ([place])   . Tobacco abuse   . Tracheal stenosis      Allergies  Allergen Reactions  . Nifedipine Other (See Comments)  . Erythromycin   . Lorazepam Other (See Comments)    REACTION: Alters mental status. "Turns into maniac"  . Penicillins Hives    Has patient had a PCN reaction causing immediate rash, facial/tongue/throat swelling, SOB or lightheadedness with hypotension: Yes Has patient had a PCN reaction causing severe rash involving mucus membranes or skin necrosis: No Has patient had a PCN reaction that required hospitalization No Has patient had a PCN reaction occurring within the last 10 years: No If all of the above answers are "NO", then may proceed with Cephalosporin use.   . Sulfonamide Derivatives Other (See Comments)    REACTION: Unknown to patient. States that MD states allergy per medical records  . Statins Itching and Rash     Current Outpatient Medications  Medication Sig Dispense Refill  . amLODipine (NORVASC) 10 MG tablet Take 1 tablet (10 mg total) by mouth daily. 180 tablet 3  . aspirin 325 MG tablet Take 1 tablet (325 mg total) by  mouth daily.    . bisacodyl (DULCOLAX) 5 MG EC tablet Take 5 mg by mouth daily as needed for moderate constipation.    Marland Kitchen doxycycline (VIBRAMYCIN) 100 MG capsule   0  . ezetimibe (ZETIA) 10 MG tablet Take 1 tablet (10 mg total) by mouth daily. 90 tablet 3  . fluvastatin XL (LESCOL XL) 80 MG 24 hr tablet Take 1 tablet (80 mg total) by mouth at bedtime. 30 tablet 0  . metoprolol tartrate (LOPRESSOR) 25 MG tablet Take 0.5 tablets (12.5 mg total) by mouth 2 (two) times daily. 180 tablet 3  . nitroGLYCERIN (NITROSTAT) 0.4 MG SL tablet Place 1 tablet (0.4 mg total) under the tongue every 5 (five) minutes as needed for chest pain. 25 tablet 3  . Omega-3 Fatty  Acids (OMEGA-3 FISH OIL PO) Take 1 capsule by mouth daily.    Marland Kitchen omeprazole (PRILOSEC) 20 MG capsule Take 20 mg every evening by mouth.     Marland Kitchen PARoxetine (PAXIL) 20 MG tablet Take 20 mg by mouth at bedtime.     Marland Kitchen spironolactone (ALDACTONE) 25 MG tablet Take 25 mg every evening by mouth.     No current facility-administered medications for this visit.      Past Surgical History:  Procedure Laterality Date  . CARDIAC SURGERY    . CAROTID STENT Right 06-13-12  . CAROTID STENT INSERTION N/A 06/13/2012   Procedure: CAROTID STENT INSERTION;  Surgeon: Serafina Mitchell, MD;  Location[facility] CATH LAB;  Service: Cardiovascular;  Laterality: N/A;  . CHOLECYSTECTOMY  2007   Gall Bladder  . COLONOSCOPY  11/10/2011   Dr. [name]: hemorrhoids, tubular adenoma  . CORONARY ARTERY BYPASS GRAFT    . EYE SURGERY    . LEFT HEART CATH AND CORS/GRAFTS ANGIOGRAPHY N/A 10/07/2016   Procedure: Left Heart Cath and Cors/Grafts Angiography;  Surgeon: Troy Sine, MD;  Location: [facility] LAB;  Service: Cardiovascular;  Laterality: N/A;  . PARTIAL LARYNGECTOMY  1998     Allergies  Allergen Reactions  . Nifedipine Other (See Comments)  . Erythromycin   . Lorazepam Other (See Comments)    REACTION: Alters mental status. "Turns into maniac"  . Penicillins Hives    Has patient had a PCN reaction causing immediate rash, facial/tongue/throat swelling, SOB or lightheadedness with hypotension: Yes Has patient had a PCN reaction causing severe rash involving mucus membranes or skin necrosis: No Has patient had a PCN reaction that required hospitalization No Has patient had a PCN reaction occurring within the last 10 years: No If all of the above answers are "NO", then may proceed with Cephalosporin use.   . Sulfonamide Derivatives Other (See Comments)    REACTION: Unknown to patient. States that MD states allergy per medical records  . Statins Itching and Rash      Family History  Problem Relation Age of  Onset  . Dementia Mother   . Heart disease Father   . Cancer Brother   . Heart disease Brother   . Hyperlipidemia Brother   . Cancer Brother   . Colon cancer Neg Hx      Social History Kenneth [name] reports that he quit smoking about 47 years ago. His smoking use included cigarettes. He started smoking about 64 years ago. He has a 15.00 pack-year smoking history. He quit smokeless tobacco use about 15 years ago. His smokeless tobacco use included chew. Kenneth [name] reports that he does not drink alcohol.   Review of Systems CONSTITUTIONAL: No weight  loss, fever, chills, weakness or fatigue.  HEENT: Eyes: No visual loss, blurred vision, double vision or yellow sclerae.No hearing loss, sneezing, congestion, runny nose or sore throat.  SKIN: No rash or itching.  CARDIOVASCULAR: per hpi RESPIRATORY: per hpi GASTROINTESTINAL: No anorexia, nausea, vomiting or diarrhea. No abdominal pain or blood.  GENITOURINARY: No burning on urination, no polyuria NEUROLOGICAL: No headache, dizziness, syncope, paralysis, ataxia, numbness or tingling in the extremities. No change in bowel or bladder control.  MUSCULOSKELETAL: No muscle, back pain, joint pain or stiffness.  LYMPHATICS: No enlarged nodes. No history of splenectomy.  PSYCHIATRIC: No history of depression or anxiety.  ENDOCRINOLOGIC: No reports of sweating, cold or heat intolerance. No polyuria or polydipsia.  Marland Kitchen   Physical Examination Vitals:   12/26/17 1540  BP: 140/82  Pulse: 62  SpO2: 96%   Vitals:   12/26/17 1540  Weight: 179 lb 3.2 oz (81.3 kg)  Height: _0  (1.676 m)    Gen: resting comfortably, no acute distress HEENT: no scleral icterus, pupils equal round and reactive, no palptable cervical adenopathy,  CV: RRR, no m/r/g, no jvd Resp: Clear to auscultation bilaterally GI: abdomen is soft, non-tender, non-distended, normal bowel sounds, no hepatosplenomegaly MSK: extremities are warm, no edema.  Skin: warm, no  rash Neuro:  no focal deficits Psych: appropriate affect   Diagnostic Studies 07/2011 MPI Tomographic views were obtained using the short axis, vertical long axis, and horizontal long axis planes. No significant, reversible perfusion defects are noted to indicate ischemia.  Gated imaging reveals an EDV of 59, ESV of 18, T I D ratio of 0.80, and LVEF of 69%.  IMPRESSION: Low risk exercise/Lexiscan Myoview as outlined. No diagnostic ST- segment changes or arrhythmias were noted. There is evidence of soft tissue attenuation, however no frank scar or ischemia. LVEF is normal at 69%.  04/2012 Echo LVEF 65-70%, grade II diastolic dysfunction,   10/15/53 Echo Study Conclusions  - Left ventricle: The cavity size was normal. Wall thickness was increased in a pattern of mild LVH. Systolic function was vigorous. The estimated ejection fraction was in the range of 65% to 70%. Wall motion was normal; there were no regional wall motion abnormalities. Features are consistent with a pseudonormal left ventricular filling pattern, with concomitant abnormal relaxation and increased filling pressure (grade 2 diastolic dysfunction). - Aortic valve: Mildly calcified annulus. Trileaflet. Trivial regurgitation. - Mitral valve: Calcified annulus. Trivial regurgitation. - Left atrium: The atrium was mildly dilated. - Right ventricle: Systolic function was low normal. - Right atrium: Central venous pressure: 9m Hg (est). - Atrial septum: No defect or patent foramen ovale was identified. - Tricuspid valve: Trivial regurgitation. - Pulmonary arteries: Systolic pressure could not be accurately estimated. - Pericardium, extracardiac: There was no pericardial effusion. Impressions:  - Normal LV chamber size with mild LVH and LVEF 673-22% grade 2 diastolic dysfunction. MIld left atrial enlargement. Mild MAC. Low normal RV contraction. Unable to assess PASP - trivial tricuspid  regurgitation.  11/2013 Lexiscan MPI IMPRESSION: 1. Negative Lexiscan MPI for ischemia  2. Normal left ventricular systolic function, LVEF 602% 3. Low risk study for major cardiac events.   09/2016 cath  LM lesion, 50 %stenosed.  Prox LAD to Mid LAD lesion, 70 %stenosed.  Ost 1st Mrg lesion, 100 %stenosed.  RIMA and is normal in caliber and anatomically normal.  RPDA-2 lesion, 50 %stenosed.  RPDA-1 lesion, 60 %stenosed.  LIMA.  The left ventricular systolic function is normal.  LV end diastolic pressure is  normal.  Normal LV function without focal segmental wall motion abnormalities.  Multivessel native CAD with 50% ostial stenosis of the LAD and diffuse 70% mid stenoses, occlusion of the OM1 vessel of the circumflex, and diffuse 60 and 50% PDA stenoses in the small caliber PDA vessel.  Patent sequential LIMA graft supplying a twin like diagonal and LAD system system.  Patent RIMA graft supplying the circumflex marginal vessel.  RECOMMENDATION: Medical therapy.   10/2017 echo Study Conclusions  - Left ventricle: The cavity size was normal. Wall thickness was   normal. Systolic function was normal. The estimated ejection   fraction was in the range of 60% to 65%. Wall motion was normal;   there were no regional wall motion abnormalities. Left   ventricular diastolic function parameters were normal. - Aortic valve: Mildly calcified annulus. Trileaflet; mildly   thickened leaflets. Valve area (VTI): 3.01 cm^2. Valve area   (Vmax): 2.24 cm^2. Valve area (Vmean): 2.3 cm^2. - Mitral valve: Mildly calcified annulus. Mildly thickened leaflets   . - Left atrium: The atrium was mildly dilated.   Assessment and Plan  1. CAD  - some recent SOB and chest tightness - we will plan for a lexiscan nuclear stress to further evaluate.   2. Hyperlipidemia  - not tolerant of statins, he will conitnue zetia.   3. HTN  - elevated today, at recent prior  visits with other providers closer to goal - continue to monitor at thist ime  4. Carotid stenosis  - continue regular vascular f/u  5. Bradycardia - stop lopressor    F/u 3 months       Arnoldo Lenis, M.D.

## 2017-12-26 NOTE — Patient Instructions (Addendum)
Medication Instructions:   Your physician has recommended you make the following change in your medication:   Stop metoprolol tartrate (lopressor)  Continue all other medications the same.  Labwork:  NONE  Testing/Procedures: Your physician has requested that you have a lexiscan myoview. For further information please visit [email or web address]. Please follow instruction sheet, as given.  Follow-Up:  Your physician recommends that you schedule a follow-up appointment in: 3 months.  Any Other Special Instructions Will Be Listed Below (If Applicable).  If you need a refill on your cardiac medications before your next appointment, please call your pharmacy.

## 2017-12-30 ENCOUNTER — Encounter (HOSPITAL_COMMUNITY)
Admission: RE | Admit: 2017-12-30 | Discharge: 2017-12-30 | Disposition: A | Discharge status: Home / Self Care | Payer: Medicare HMO | Source: Ambulatory Visit | Attending: Cardiology | Admitting: Cardiology

## 2017-12-30 ENCOUNTER — Encounter (HOSPITAL_BASED_OUTPATIENT_CLINIC_OR_DEPARTMENT_OTHER)
Admission: RE | Admit: 2017-12-30 | Discharge: 2017-12-30 | Disposition: A | Discharge status: Home / Self Care | Payer: Medicare HMO | Source: Ambulatory Visit | Attending: Cardiology | Admitting: Cardiology

## 2017-12-30 ENCOUNTER — Encounter (HOSPITAL_COMMUNITY): Payer: Self-pay

## 2017-12-30 DIAGNOSIS — R001 Bradycardia, unspecified: Secondary | ICD-10-CM | POA: Diagnosis not present

## 2017-12-30 DIAGNOSIS — R079 Chest pain, unspecified: Secondary | ICD-10-CM

## 2017-12-30 DIAGNOSIS — I4510 Unspecified right bundle-branch block: Secondary | ICD-10-CM | POA: Insufficient documentation

## 2017-12-30 DIAGNOSIS — R0602 Shortness of breath: Secondary | ICD-10-CM

## 2017-12-30 LAB — NM MYOCAR MULTI W/SPECT W/WALL MOTION / EF
LV dias vol: 54 mL (ref 62–150)
LV sys vol: 18 mL
Peak HR: 78 {beats}/min
RATE: 0.32
Rest HR: 63 {beats}/min
SDS: 0
SRS: 0
SSS: 0
TID: 1.05

## 2017-12-30 MED ORDER — REGADENOSON 0.4 MG/5ML IV SOLN
INTRAVENOUS | Status: AC
  Administered 2017-12-30: 0.4 mg via INTRAVENOUS
  Filled 2017-12-30: qty 5

## 2017-12-30 MED ORDER — TECHNETIUM TC 99M TETROFOSMIN IV KIT
10.0000 | PACK | Freq: Once | INTRAVENOUS | Status: AC | PRN
  Administered 2017-12-30: 10.8 via INTRAVENOUS

## 2017-12-30 MED ORDER — SODIUM CHLORIDE 0.9% FLUSH
INTRAVENOUS | Status: AC
  Administered 2017-12-30: 10 mL via INTRAVENOUS
  Filled 2017-12-30: qty 10

## 2017-12-30 MED ORDER — TECHNETIUM TC 99M TETROFOSMIN IV KIT
30.0000 | PACK | Freq: Once | INTRAVENOUS | Status: AC | PRN
  Administered 2017-12-30: 33 via INTRAVENOUS

## 2017-12-30 MED ORDER — SODIUM CHLORIDE 0.9% FLUSH
INTRAVENOUS | Status: AC
  Filled 2017-12-30: qty 160

## 2017-12-31 ENCOUNTER — Encounter: Payer: Self-pay | Admitting: Cardiology

## 2018-01-04 ENCOUNTER — Ambulatory Visit
Admission: RE | Admit: 2018-01-04 | Discharge: 2018-01-04 | Disposition: A | Discharge status: Home / Self Care | Payer: Medicare HMO | Source: Ambulatory Visit | Attending: Vascular Surgery | Admitting: Vascular Surgery

## 2018-01-04 DIAGNOSIS — I6523 Occlusion and stenosis of bilateral carotid arteries: Secondary | ICD-10-CM | POA: Diagnosis not present

## 2018-01-04 DIAGNOSIS — Z01812 Encounter for preprocedural laboratory examination: Secondary | ICD-10-CM

## 2018-01-04 DIAGNOSIS — I639 Cerebral infarction, unspecified: Secondary | ICD-10-CM

## 2018-01-04 MED ORDER — IOPAMIDOL (ISOVUE-370) INJECTION 76%
75.0000 mL | Freq: Once | INTRAVENOUS | Status: AC | PRN
  Administered 2018-01-04: 75 mL via INTRAVENOUS

## 2018-01-16 NOTE — Progress Notes (Signed)
Established Carotid Patient   History of Present Illness   Kenneth [name] is a 77 y.o. (1940/09/30) male who presents with chief complaint: follow up on CTA Neck  Previous carotid studies demonstrated: RICA <16% stenosis, LICA 10-96% stenosis.  Patient has known history of stroke.  The patient has never had amaurosis fugax or monocular blindness.  The patient has never had facial drooping or hemiplegia.  The patient previously has had some L arm weakness that resolved and most recently had expressive aphasia and saliva draining from corner of his mouth.  The patient's previous neurologic deficits have resolved.  Pt previously has a R CAS on 06/13/12 with Dr. Michel Bickers due to prior laryngeal surgery and adjuvant XRT.  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  The patient's PMH, PSH, SH, and FamHx were reviewed on 02/17/18 are unchanged from 01/16/2018.  Current Outpatient Medications  Medication Sig Dispense Refill  . amLODipine (NORVASC) 10 MG tablet Take 1 tablet (10 mg total) by mouth daily. 180 tablet 3  . aspirin 325 MG tablet Take 1 tablet (325 mg total) by mouth daily.    . clopidogrel (PLAVIX) 75 MG tablet Take 1 tablet (75 mg total) by mouth daily. 90 tablet 3  . docusate sodium (COLACE) 100 MG capsule Take 100 mg by mouth daily as needed for mild constipation.    Marland Kitchen ezetimibe (ZETIA) 10 MG tablet Take 1 tablet (10 mg total) by mouth daily. 90 tablet 3  . nitroGLYCERIN (NITROSTAT) 0.4 MG SL tablet Place 1 tablet (0.4 mg total) under the tongue every 5 (five) minutes as needed for chest pain. 25 tablet 3  . Omega-3 Fatty Acids (OMEGA-3 FISH OIL PO) Take 1 capsule by mouth daily.    Marland Kitchen omeprazole (PRILOSEC) 20 MG capsule Take 20 mg every evening by mouth.     Marland Kitchen PARoxetine (PAXIL) 20 MG tablet Take 20 mg by mouth at bedtime.     Marland Kitchen spironolactone (ALDACTONE) 25 MG tablet Take 25 mg every evening by mouth.     No current  facility-administered medications for this visit.     On ROS today: progressive difficulty breathing, no oxygen use at night   Physical Examination   Vitals:   01/18/18 0832  BP: (!) 141/83  Pulse: (!) 56  Temp: (!) 96.8 F (36 C)  TempSrc: Oral  SpO2: 97%  Weight: 181 lb (82.1 kg)  Height: 5\' 6"  (1.676 m)   Body mass index is 29.21 kg/m.  General Alert, O x 3, WD, NAD  Neck Mid-line trachea,  somewhat frozen neck  Pulmonary Sym exp, audible airway restriction, CTA B  Cardiac RRR, Nl S1, S2, no Murmurs, No rubs,   Vascular Vessel Right Left  Radial Palpable Palpable  Brachial Palpable Palpable  Carotid Palpable, No Bruit Palpable, No Bruit  Aorta Not palpable N/A  Femoral Palpable Palpable  Popliteal Not palpable Not palpable  PT Not palpable Not palpable  DP Faintly palpable Faintly palpable    Gastro- intestinal soft, non-distended, non-tender to palpation, No guarding or rebound, no HSM, no masses, no CVAT B, No palpable prominent aortic pulse,    Musculo- skeletal M/S 5/5 throughout  , Extremities without ischemic changes    Neurologic Cranial nerves 2-12 intact , Pain and light touch intact in extremities , Motor exam as listed above    Radiology     CTA Neck (01/04/18):  1. Bulky soft plaque in the Right CCA with  estimated 65% stenosis at the level of the subglottic trachea. There is a 6 mm bulky soft plaque ulceration or less likely pseudoaneurysm projecting anteriorly from the mid Right CCA (series 9, image 87). Patent Right ICA stent with mild in stent plaque, no significant stenosis. Up to moderate Right ICA siphon plaque is partially visible. 2. Soft plaque in the Left CCA without stenosis. Soft and calcified plaque at the Left ICA origin with estimated 64% stenosis. Only mild atherosclerosis of the visible Left ICA siphon. 3. High-grade Radiographic String Sign stenosis of the Right Vertebral Artery V4 segment distal to the patent right PICA  origin. 4. Left Vertebral artery V4 mild-to-moderate stenosis due to soft plaque proximal to the patent left PICA origin. 5. Stable CT appearance of the neck soft tissues and upper chest since 2017.  I reviewed the neck CTA, the patient has a >70% stenosis in proximal CCA with ulcerated segment just distal.  These lesions may be the cause of this patient's R CVA.   Medical Decision Making   [name] is a 77 y.o. male who presents with: s/p R CAS for sx R ICA stenosis >80%, possible sx R CCA stenosis >50%, asx L ICA stenosis <70%   Given R CVA and R CCA stenosis, pt needs retrograde placement of a R CCA.  However, this patient has a significant airway issue that will block any carotid intervention until this is address.  I don't think he is safe for a transfemoral carotid stent placement because of the airway issue.    I also think that going to the OR for the carotid exposure and retrograde CCA stenting would likely require an awake intubation.  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  Pt is scheduled to see an ENT at [facility] in September.  He is going to move up his appt as his sx are worsening.  We discussed having him come back after his tracheostomy for re-evaluation for the R CCA retrograde stent placement in 3 months.  I'm going to have him follow up with Dr. Trula Slade, since he place his prior CAS.  I discussed in depth with the patient the nature of atherosclerosis, and emphasized the importance of maximal medical management including strict control of blood pressure, blood glucose, and lipid levels, antiplatelet agents, obtaining regular exercise, and cessation of smoking.    The patient is aware that without maximal medical management the underlying atherosclerotic disease process will progress, limiting the benefit of any  interventions.  The patient is currently not on a statin due to reported allergy.   The patient is currently on an anti-platelet: ASA and Plavix.  Thank you for allowing [place] to participate in this patient's care.   Adele Barthel, MD, FACS [facility] Office: [phone] Pager: [phone]

## 2018-01-18 ENCOUNTER — Ambulatory Visit: Payer: Medicare HMO | Admitting: Vascular Surgery

## 2018-01-18 ENCOUNTER — Encounter: Payer: Self-pay | Admitting: Vascular Surgery

## 2018-01-18 VITALS — BP 141/83 | HR 56 | Temp 96.8°F | Ht 66.0 in | Wt 181.0 lb

## 2018-01-18 DIAGNOSIS — I6523 Occlusion and stenosis of bilateral carotid arteries: Secondary | ICD-10-CM | POA: Diagnosis not present

## 2018-01-18 DIAGNOSIS — J398 Other specified diseases of upper respiratory tract: Secondary | ICD-10-CM

## 2018-01-18 DIAGNOSIS — I639 Cerebral infarction, unspecified: Secondary | ICD-10-CM

## 2018-01-27 DIAGNOSIS — K219 Gastro-esophageal reflux disease without esophagitis: Secondary | ICD-10-CM | POA: Diagnosis not present

## 2018-01-27 DIAGNOSIS — I2510 Atherosclerotic heart disease of native coronary artery without angina pectoris: Secondary | ICD-10-CM | POA: Diagnosis not present

## 2018-01-27 DIAGNOSIS — E785 Hyperlipidemia, unspecified: Secondary | ICD-10-CM | POA: Diagnosis not present

## 2018-01-27 DIAGNOSIS — K5900 Constipation, unspecified: Secondary | ICD-10-CM | POA: Diagnosis not present

## 2018-01-27 DIAGNOSIS — I10 Essential (primary) hypertension: Secondary | ICD-10-CM | POA: Diagnosis not present

## 2018-01-27 DIAGNOSIS — Z7902 Long term (current) use of antithrombotics/antiplatelets: Secondary | ICD-10-CM | POA: Diagnosis not present

## 2018-01-27 DIAGNOSIS — R69 Illness, unspecified: Secondary | ICD-10-CM | POA: Diagnosis not present

## 2018-01-27 DIAGNOSIS — I739 Peripheral vascular disease, unspecified: Secondary | ICD-10-CM | POA: Diagnosis not present

## 2018-01-27 DIAGNOSIS — Z7982 Long term (current) use of aspirin: Secondary | ICD-10-CM | POA: Diagnosis not present

## 2018-02-09 DIAGNOSIS — Z87891 Personal history of nicotine dependence: Secondary | ICD-10-CM | POA: Diagnosis not present

## 2018-02-09 DIAGNOSIS — Q310 Web of larynx: Secondary | ICD-10-CM | POA: Diagnosis not present

## 2018-02-09 DIAGNOSIS — R1312 Dysphagia, oropharyngeal phase: Secondary | ICD-10-CM | POA: Diagnosis not present

## 2018-02-09 DIAGNOSIS — I6523 Occlusion and stenosis of bilateral carotid arteries: Secondary | ICD-10-CM | POA: Diagnosis not present

## 2018-02-09 DIAGNOSIS — J386 Stenosis of larynx: Secondary | ICD-10-CM | POA: Diagnosis not present

## 2018-02-09 DIAGNOSIS — J398 Other specified diseases of upper respiratory tract: Secondary | ICD-10-CM | POA: Diagnosis not present

## 2018-02-09 DIAGNOSIS — J3802 Paralysis of vocal cords and larynx, bilateral: Secondary | ICD-10-CM | POA: Diagnosis not present

## 2018-02-09 DIAGNOSIS — Z8521 Personal history of malignant neoplasm of larynx: Secondary | ICD-10-CM | POA: Diagnosis not present

## 2018-02-09 DIAGNOSIS — J3800 Paralysis of vocal cords and larynx, unspecified: Secondary | ICD-10-CM | POA: Diagnosis not present

## 2018-02-09 DIAGNOSIS — R0600 Dyspnea, unspecified: Secondary | ICD-10-CM | POA: Diagnosis not present

## 2018-02-09 DIAGNOSIS — R1310 Dysphagia, unspecified: Secondary | ICD-10-CM | POA: Diagnosis not present

## 2018-02-09 DIAGNOSIS — Z8673 Personal history of transient ischemic attack (TIA), and cerebral infarction without residual deficits: Secondary | ICD-10-CM | POA: Diagnosis not present

## 2018-02-09 DIAGNOSIS — Z923 Personal history of irradiation: Secondary | ICD-10-CM | POA: Diagnosis not present

## 2018-02-15 DIAGNOSIS — I2510 Atherosclerotic heart disease of native coronary artery without angina pectoris: Secondary | ICD-10-CM | POA: Insufficient documentation

## 2018-03-06 DIAGNOSIS — N183 Chronic kidney disease, stage 3 (moderate): Secondary | ICD-10-CM | POA: Diagnosis not present

## 2018-03-06 DIAGNOSIS — Z430 Encounter for attention to tracheostomy: Secondary | ICD-10-CM | POA: Diagnosis not present

## 2018-03-06 DIAGNOSIS — Z951 Presence of aortocoronary bypass graft: Secondary | ICD-10-CM | POA: Diagnosis not present

## 2018-03-06 DIAGNOSIS — Z930 Tracheostomy status: Secondary | ICD-10-CM | POA: Diagnosis not present

## 2018-03-06 DIAGNOSIS — I6523 Occlusion and stenosis of bilateral carotid arteries: Secondary | ICD-10-CM | POA: Diagnosis not present

## 2018-03-06 DIAGNOSIS — I25119 Atherosclerotic heart disease of native coronary artery with unspecified angina pectoris: Secondary | ICD-10-CM | POA: Diagnosis not present

## 2018-03-06 DIAGNOSIS — I2510 Atherosclerotic heart disease of native coronary artery without angina pectoris: Secondary | ICD-10-CM | POA: Diagnosis not present

## 2018-03-06 DIAGNOSIS — J3800 Paralysis of vocal cords and larynx, unspecified: Secondary | ICD-10-CM | POA: Diagnosis not present

## 2018-03-06 DIAGNOSIS — Z8673 Personal history of transient ischemic attack (TIA), and cerebral infarction without residual deficits: Secondary | ICD-10-CM | POA: Diagnosis not present

## 2018-03-06 DIAGNOSIS — E785 Hyperlipidemia, unspecified: Secondary | ICD-10-CM | POA: Diagnosis not present

## 2018-03-06 DIAGNOSIS — D62 Acute posthemorrhagic anemia: Secondary | ICD-10-CM | POA: Diagnosis not present

## 2018-03-06 DIAGNOSIS — K219 Gastro-esophageal reflux disease without esophagitis: Secondary | ICD-10-CM | POA: Diagnosis not present

## 2018-03-06 DIAGNOSIS — I129 Hypertensive chronic kidney disease with stage 1 through stage 4 chronic kidney disease, or unspecified chronic kidney disease: Secondary | ICD-10-CM | POA: Diagnosis not present

## 2018-03-06 DIAGNOSIS — I6521 Occlusion and stenosis of right carotid artery: Secondary | ICD-10-CM | POA: Diagnosis not present

## 2018-03-06 DIAGNOSIS — R69 Illness, unspecified: Secondary | ICD-10-CM | POA: Diagnosis not present

## 2018-03-06 DIAGNOSIS — R1312 Dysphagia, oropharyngeal phase: Secondary | ICD-10-CM | POA: Diagnosis not present

## 2018-03-06 DIAGNOSIS — Z923 Personal history of irradiation: Secondary | ICD-10-CM | POA: Diagnosis not present

## 2018-03-06 DIAGNOSIS — R339 Retention of urine, unspecified: Secondary | ICD-10-CM | POA: Diagnosis not present

## 2018-03-06 DIAGNOSIS — Z8521 Personal history of malignant neoplasm of larynx: Secondary | ICD-10-CM | POA: Diagnosis not present

## 2018-03-06 DIAGNOSIS — J398 Other specified diseases of upper respiratory tract: Secondary | ICD-10-CM | POA: Diagnosis not present

## 2018-03-06 DIAGNOSIS — J386 Stenosis of larynx: Secondary | ICD-10-CM | POA: Diagnosis not present

## 2018-03-07 DIAGNOSIS — E785 Hyperlipidemia, unspecified: Secondary | ICD-10-CM | POA: Diagnosis not present

## 2018-03-07 DIAGNOSIS — I129 Hypertensive chronic kidney disease with stage 1 through stage 4 chronic kidney disease, or unspecified chronic kidney disease: Secondary | ICD-10-CM | POA: Diagnosis not present

## 2018-03-07 DIAGNOSIS — R339 Retention of urine, unspecified: Secondary | ICD-10-CM | POA: Diagnosis not present

## 2018-03-07 DIAGNOSIS — Z951 Presence of aortocoronary bypass graft: Secondary | ICD-10-CM | POA: Diagnosis not present

## 2018-03-07 DIAGNOSIS — N183 Chronic kidney disease, stage 3 (moderate): Secondary | ICD-10-CM | POA: Diagnosis not present

## 2018-03-07 DIAGNOSIS — Z930 Tracheostomy status: Secondary | ICD-10-CM | POA: Diagnosis not present

## 2018-03-07 DIAGNOSIS — J386 Stenosis of larynx: Secondary | ICD-10-CM | POA: Diagnosis not present

## 2018-03-07 DIAGNOSIS — I2510 Atherosclerotic heart disease of native coronary artery without angina pectoris: Secondary | ICD-10-CM | POA: Diagnosis not present

## 2018-03-09 DIAGNOSIS — Z930 Tracheostomy status: Secondary | ICD-10-CM | POA: Diagnosis not present

## 2018-03-09 DIAGNOSIS — Z8521 Personal history of malignant neoplasm of larynx: Secondary | ICD-10-CM | POA: Diagnosis not present

## 2018-03-09 DIAGNOSIS — J398 Other specified diseases of upper respiratory tract: Secondary | ICD-10-CM | POA: Diagnosis not present

## 2018-03-10 DIAGNOSIS — I2510 Atherosclerotic heart disease of native coronary artery without angina pectoris: Secondary | ICD-10-CM | POA: Diagnosis not present

## 2018-03-10 DIAGNOSIS — Z8673 Personal history of transient ischemic attack (TIA), and cerebral infarction without residual deficits: Secondary | ICD-10-CM | POA: Diagnosis not present

## 2018-03-10 DIAGNOSIS — R69 Illness, unspecified: Secondary | ICD-10-CM | POA: Diagnosis not present

## 2018-03-10 DIAGNOSIS — K219 Gastro-esophageal reflux disease without esophagitis: Secondary | ICD-10-CM | POA: Diagnosis not present

## 2018-03-10 DIAGNOSIS — R1312 Dysphagia, oropharyngeal phase: Secondary | ICD-10-CM | POA: Diagnosis not present

## 2018-03-10 DIAGNOSIS — J398 Other specified diseases of upper respiratory tract: Secondary | ICD-10-CM | POA: Diagnosis not present

## 2018-03-10 DIAGNOSIS — Z8521 Personal history of malignant neoplasm of larynx: Secondary | ICD-10-CM | POA: Diagnosis not present

## 2018-03-10 DIAGNOSIS — N183 Chronic kidney disease, stage 3 (moderate): Secondary | ICD-10-CM | POA: Diagnosis not present

## 2018-03-10 DIAGNOSIS — I129 Hypertensive chronic kidney disease with stage 1 through stage 4 chronic kidney disease, or unspecified chronic kidney disease: Secondary | ICD-10-CM | POA: Diagnosis not present

## 2018-03-10 DIAGNOSIS — Z930 Tracheostomy status: Secondary | ICD-10-CM | POA: Diagnosis not present

## 2018-03-10 DIAGNOSIS — Z430 Encounter for attention to tracheostomy: Secondary | ICD-10-CM | POA: Diagnosis not present

## 2018-03-10 DIAGNOSIS — I6521 Occlusion and stenosis of right carotid artery: Secondary | ICD-10-CM | POA: Diagnosis not present

## 2018-03-13 DIAGNOSIS — R69 Illness, unspecified: Secondary | ICD-10-CM | POA: Diagnosis not present

## 2018-03-13 DIAGNOSIS — K219 Gastro-esophageal reflux disease without esophagitis: Secondary | ICD-10-CM | POA: Diagnosis not present

## 2018-03-13 DIAGNOSIS — I6521 Occlusion and stenosis of right carotid artery: Secondary | ICD-10-CM | POA: Diagnosis not present

## 2018-03-13 DIAGNOSIS — Z8673 Personal history of transient ischemic attack (TIA), and cerebral infarction without residual deficits: Secondary | ICD-10-CM | POA: Diagnosis not present

## 2018-03-13 DIAGNOSIS — N183 Chronic kidney disease, stage 3 (moderate): Secondary | ICD-10-CM | POA: Diagnosis not present

## 2018-03-13 DIAGNOSIS — Z430 Encounter for attention to tracheostomy: Secondary | ICD-10-CM | POA: Diagnosis not present

## 2018-03-13 DIAGNOSIS — I129 Hypertensive chronic kidney disease with stage 1 through stage 4 chronic kidney disease, or unspecified chronic kidney disease: Secondary | ICD-10-CM | POA: Diagnosis not present

## 2018-03-13 DIAGNOSIS — I2510 Atherosclerotic heart disease of native coronary artery without angina pectoris: Secondary | ICD-10-CM | POA: Diagnosis not present

## 2018-03-13 DIAGNOSIS — R1312 Dysphagia, oropharyngeal phase: Secondary | ICD-10-CM | POA: Diagnosis not present

## 2018-03-13 HISTORY — PX: TRACHEOSTOMY: SUR1362

## 2018-03-15 DIAGNOSIS — Z430 Encounter for attention to tracheostomy: Secondary | ICD-10-CM | POA: Diagnosis not present

## 2018-03-15 DIAGNOSIS — I6521 Occlusion and stenosis of right carotid artery: Secondary | ICD-10-CM | POA: Diagnosis not present

## 2018-03-15 DIAGNOSIS — R1312 Dysphagia, oropharyngeal phase: Secondary | ICD-10-CM | POA: Diagnosis not present

## 2018-03-15 DIAGNOSIS — Z8673 Personal history of transient ischemic attack (TIA), and cerebral infarction without residual deficits: Secondary | ICD-10-CM | POA: Diagnosis not present

## 2018-03-15 DIAGNOSIS — R69 Illness, unspecified: Secondary | ICD-10-CM | POA: Diagnosis not present

## 2018-03-15 DIAGNOSIS — K219 Gastro-esophageal reflux disease without esophagitis: Secondary | ICD-10-CM | POA: Diagnosis not present

## 2018-03-15 DIAGNOSIS — I129 Hypertensive chronic kidney disease with stage 1 through stage 4 chronic kidney disease, or unspecified chronic kidney disease: Secondary | ICD-10-CM | POA: Diagnosis not present

## 2018-03-15 DIAGNOSIS — I2510 Atherosclerotic heart disease of native coronary artery without angina pectoris: Secondary | ICD-10-CM | POA: Diagnosis not present

## 2018-03-15 DIAGNOSIS — N183 Chronic kidney disease, stage 3 (moderate): Secondary | ICD-10-CM | POA: Diagnosis not present

## 2018-03-28 DIAGNOSIS — I129 Hypertensive chronic kidney disease with stage 1 through stage 4 chronic kidney disease, or unspecified chronic kidney disease: Secondary | ICD-10-CM | POA: Diagnosis not present

## 2018-03-28 DIAGNOSIS — N183 Chronic kidney disease, stage 3 (moderate): Secondary | ICD-10-CM | POA: Diagnosis not present

## 2018-03-28 DIAGNOSIS — I2510 Atherosclerotic heart disease of native coronary artery without angina pectoris: Secondary | ICD-10-CM | POA: Diagnosis not present

## 2018-03-28 DIAGNOSIS — R1312 Dysphagia, oropharyngeal phase: Secondary | ICD-10-CM | POA: Diagnosis not present

## 2018-03-28 DIAGNOSIS — R69 Illness, unspecified: Secondary | ICD-10-CM | POA: Diagnosis not present

## 2018-03-28 DIAGNOSIS — I6521 Occlusion and stenosis of right carotid artery: Secondary | ICD-10-CM | POA: Diagnosis not present

## 2018-03-28 DIAGNOSIS — Z8673 Personal history of transient ischemic attack (TIA), and cerebral infarction without residual deficits: Secondary | ICD-10-CM | POA: Diagnosis not present

## 2018-03-28 DIAGNOSIS — K219 Gastro-esophageal reflux disease without esophagitis: Secondary | ICD-10-CM | POA: Diagnosis not present

## 2018-03-28 DIAGNOSIS — Z430 Encounter for attention to tracheostomy: Secondary | ICD-10-CM | POA: Diagnosis not present

## 2018-03-31 ENCOUNTER — Ambulatory Visit: Payer: Medicare HMO | Admitting: Cardiology

## 2018-04-03 ENCOUNTER — Ambulatory Visit: Payer: Medicare HMO | Admitting: Surgery

## 2018-04-03 ENCOUNTER — Encounter: Payer: Self-pay | Admitting: Surgery

## 2018-04-03 ENCOUNTER — Other Ambulatory Visit: Payer: Self-pay

## 2018-04-03 ENCOUNTER — Other Ambulatory Visit: Payer: Self-pay | Admitting: *Deleted

## 2018-04-03 VITALS — BP 136/81 | HR 53 | Temp 98.6°F | Resp 20 | Ht 66.0 in | Wt 180.0 lb

## 2018-04-03 DIAGNOSIS — Z930 Tracheostomy status: Secondary | ICD-10-CM | POA: Diagnosis not present

## 2018-04-03 DIAGNOSIS — Z8521 Personal history of malignant neoplasm of larynx: Secondary | ICD-10-CM | POA: Diagnosis not present

## 2018-04-03 DIAGNOSIS — J398 Other specified diseases of upper respiratory tract: Secondary | ICD-10-CM | POA: Diagnosis not present

## 2018-04-03 DIAGNOSIS — I6521 Occlusion and stenosis of right carotid artery: Secondary | ICD-10-CM

## 2018-04-03 IMAGING — CT CT ANGIO CHEST
2 of 6 series · 19 of 36 positions shown · IV contrast (Isovue)
Comparison: None.

CLINICAL DATA: Shortness of breath on exertion. Global weakness and
fatigue. Elevated D-dimer.

EXAM:
CT ANGIOGRAPHY CHEST WITH CONTRAST
TECHNIQUE: Multidetector CT imaging of the chest was performed using the
standard protocol during bolus administration of intravenous
contrast. Multiplanar CT image reconstructions and MIPs were
obtained to evaluate the vascular anatomy.
CONTRAST:  100 mL Isovue 370

[Series 5: thins · axial · 0.79mm/px · z∈[-283,-26]mm · 18 of 287 slices shown]
[im 15/287  lung]
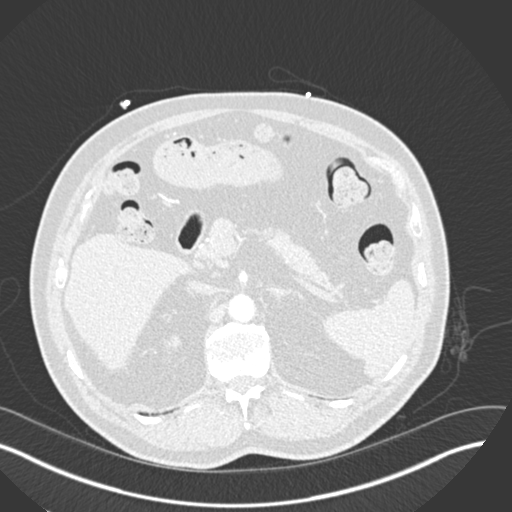
[im 29/287  mediastinal]
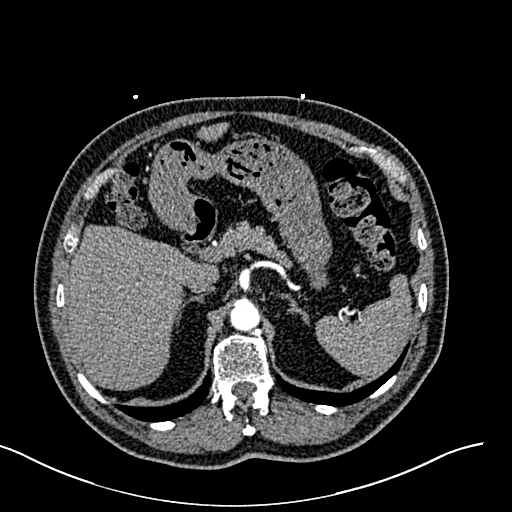
[im 43/287  lung]
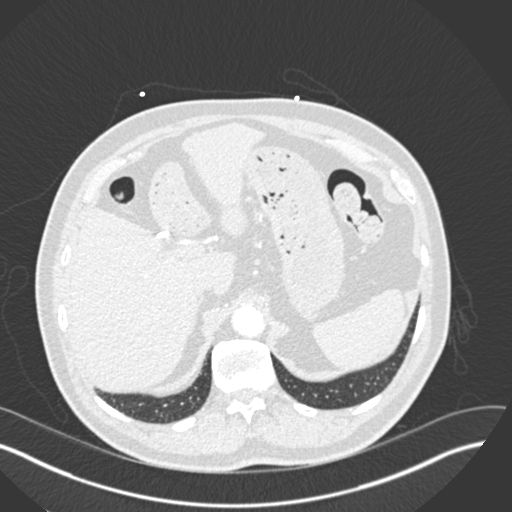
[im 58/287  mediastinal]
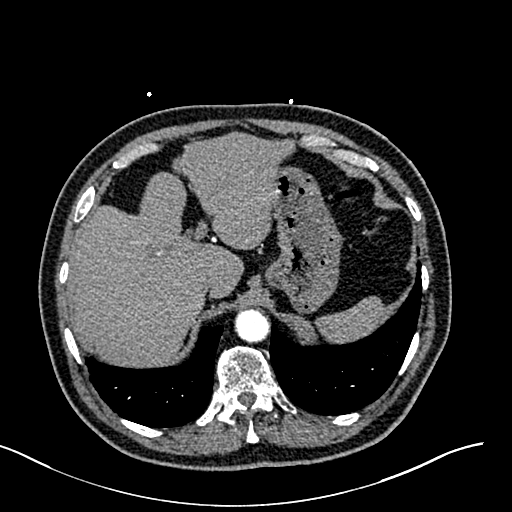
[im 72/287  lung]
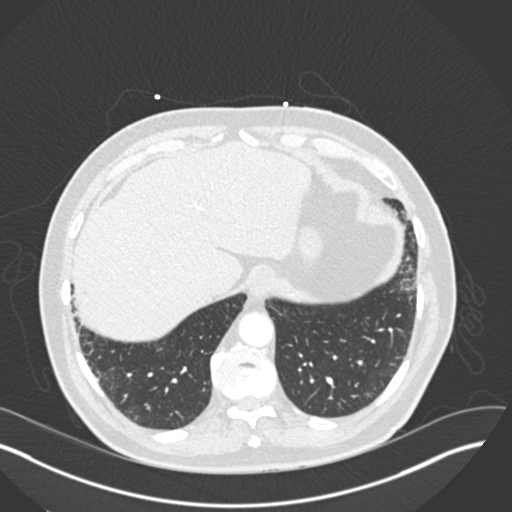
[im 86/287  mediastinal]
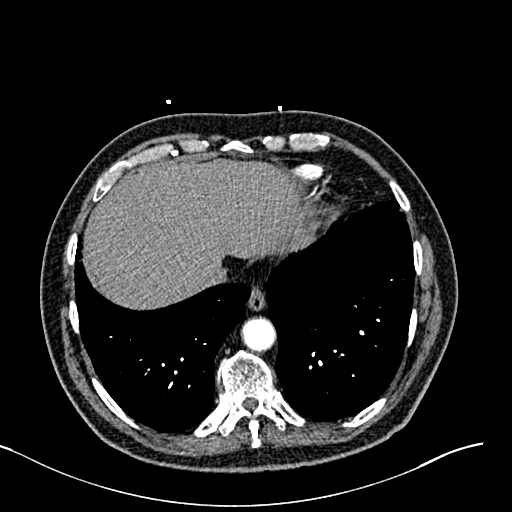
[im 101/287  lung]
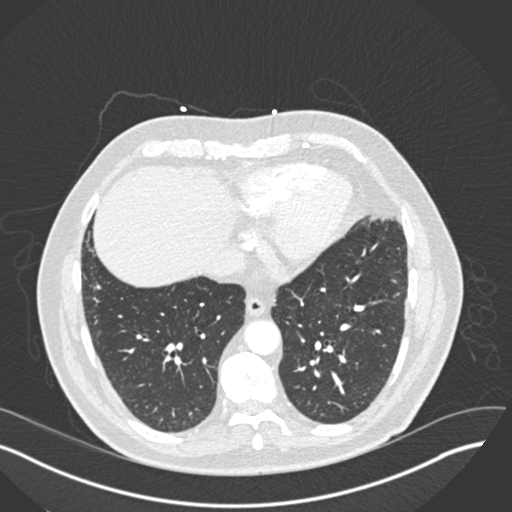
[im 115/287  mediastinal]
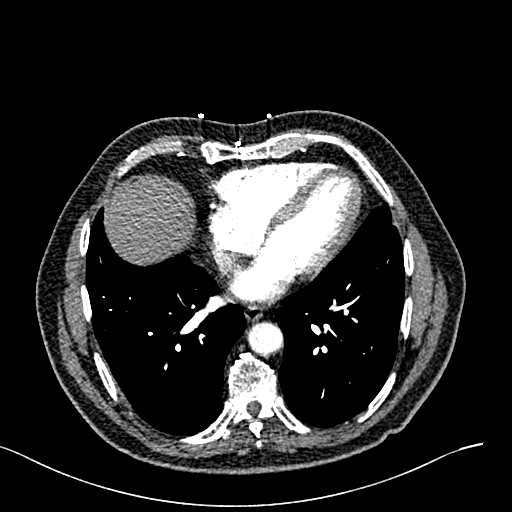
[im 129/287  lung]
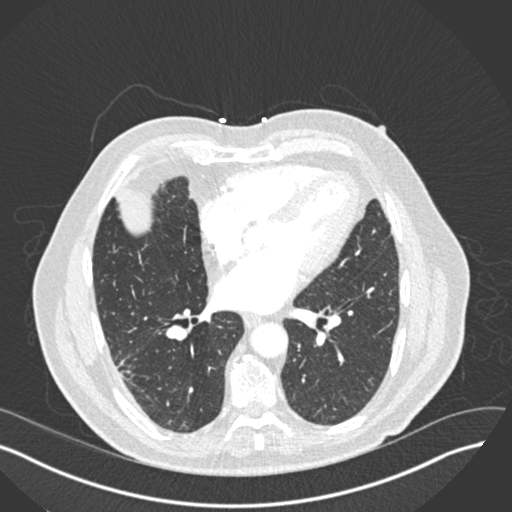
[im 158/287  mediastinal]
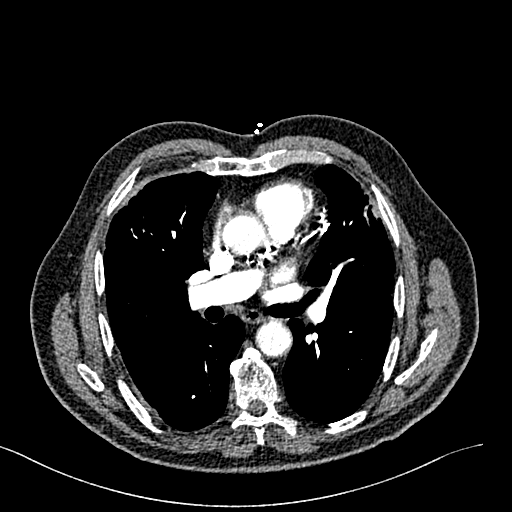
[im 172/287  lung]
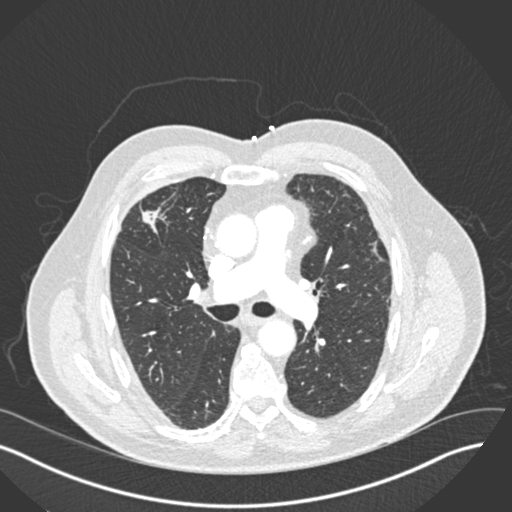
[im 186/287  mediastinal]
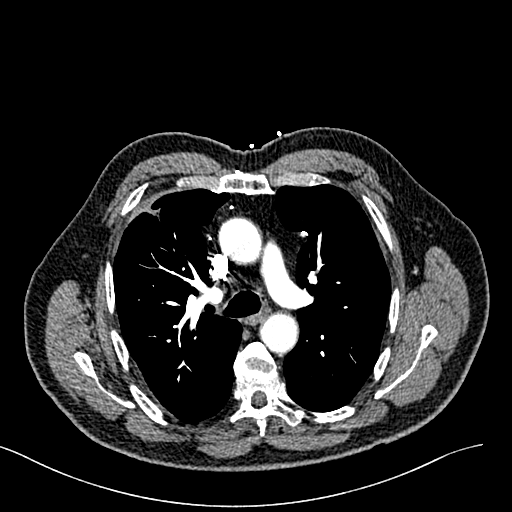
[im 201/287  lung]
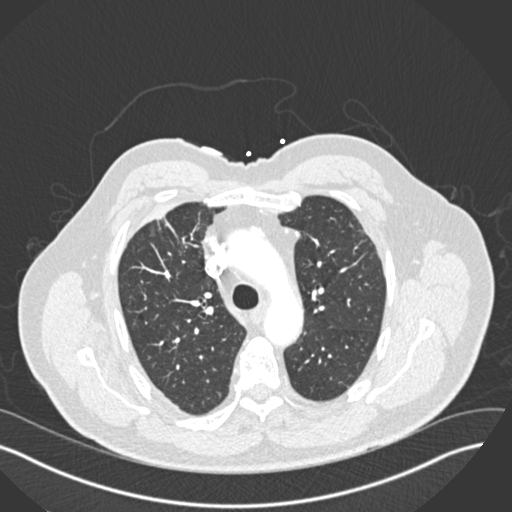
[im 215/287  mediastinal]
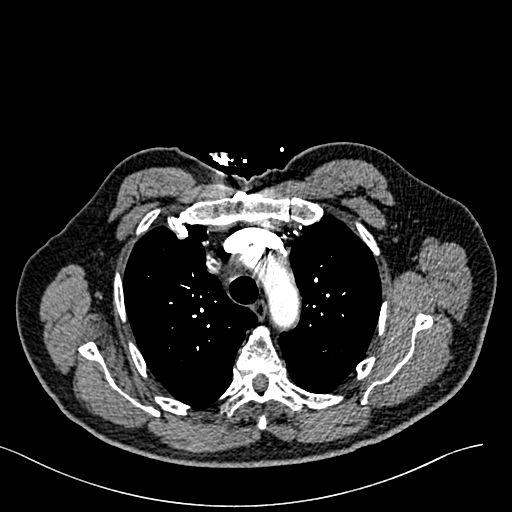
[im 229/287  lung]
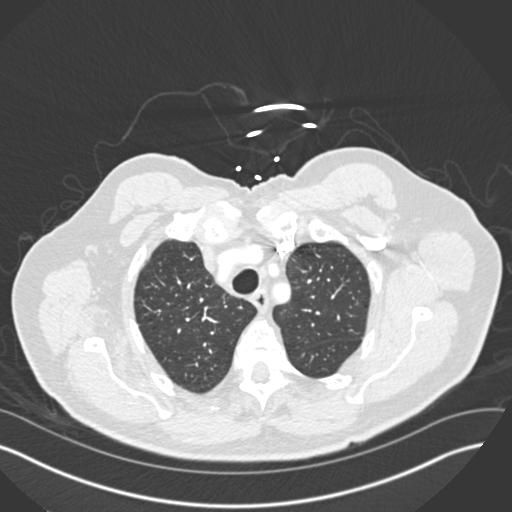
[im 244/287  mediastinal]
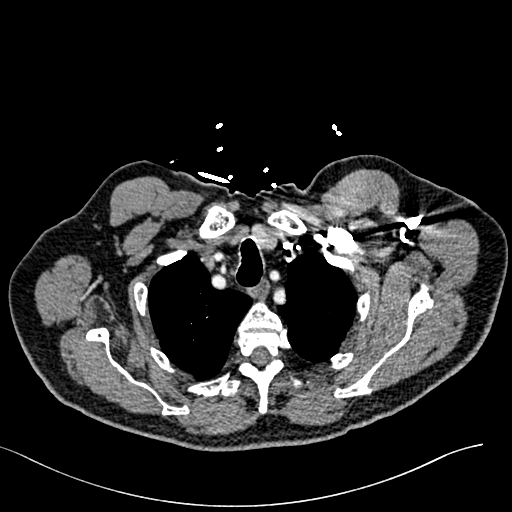
[im 258/287  lung]
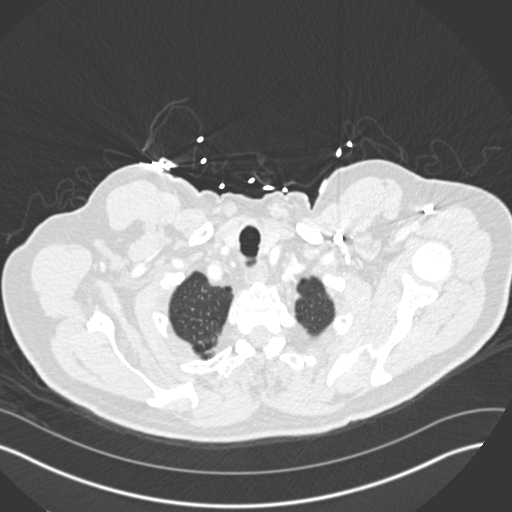
[im 272/287  mediastinal]
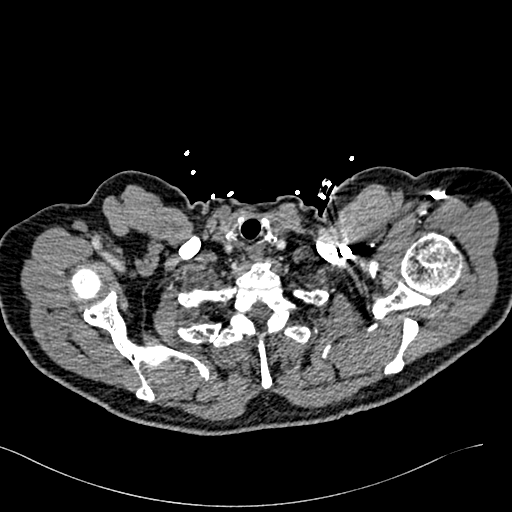

[Series 7: coronal mpr · coronal · 0.59mm/px · 1 of 151 slices shown]
[im 76/151  mediastinal]
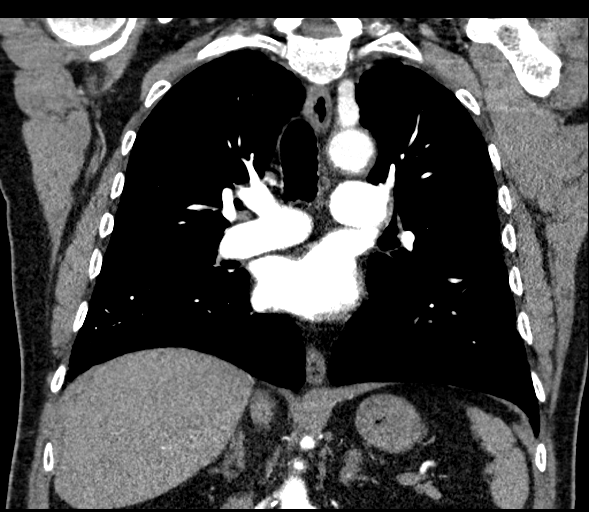

[19 of 36 positions shown; findings below may reference images not displayed]

FINDINGS: Cardiovascular: Satisfactory opacification of the pulmonary arteries
to the segmental level. No evidence of pulmonary embolism. Normal
heart size. No pericardial effusion. Postoperative changes in the
mediastinum consistent with coronary artery bypass grafts. Normal
caliber thoracic aorta with scattered calcification. No aneurysm or
dissection.

Mediastinum/Nodes: No enlarged mediastinal, hilar, or axillary lymph
nodes. Thyroid gland, trachea, and esophagus demonstrate no
significant findings.

Lungs/Pleura: Focal linear scarring in the right middle lobe and
left lingula. No consolidation or airspace disease. No pleural
effusions. No pneumothorax. Airways are patent.

Upper Abdomen: Surgical absence of the gallbladder.

Musculoskeletal: Degenerative changes in the spine. No destructive
bone lesions.

Review of the MIP images confirms the above findings.
IMPRESSION: No evidence of significant pulmonary embolus. Scarring in the lungs
without evidence of active pulmonary disease.

## 2018-04-03 IMAGING — DX DG CHEST 2V
2 series · 2 of 2 positions shown · non-contrast
Comparison: 12/05/2015

CLINICAL DATA: Shortness of Breath

EXAM:
CHEST  2 VIEW

[chest pa]
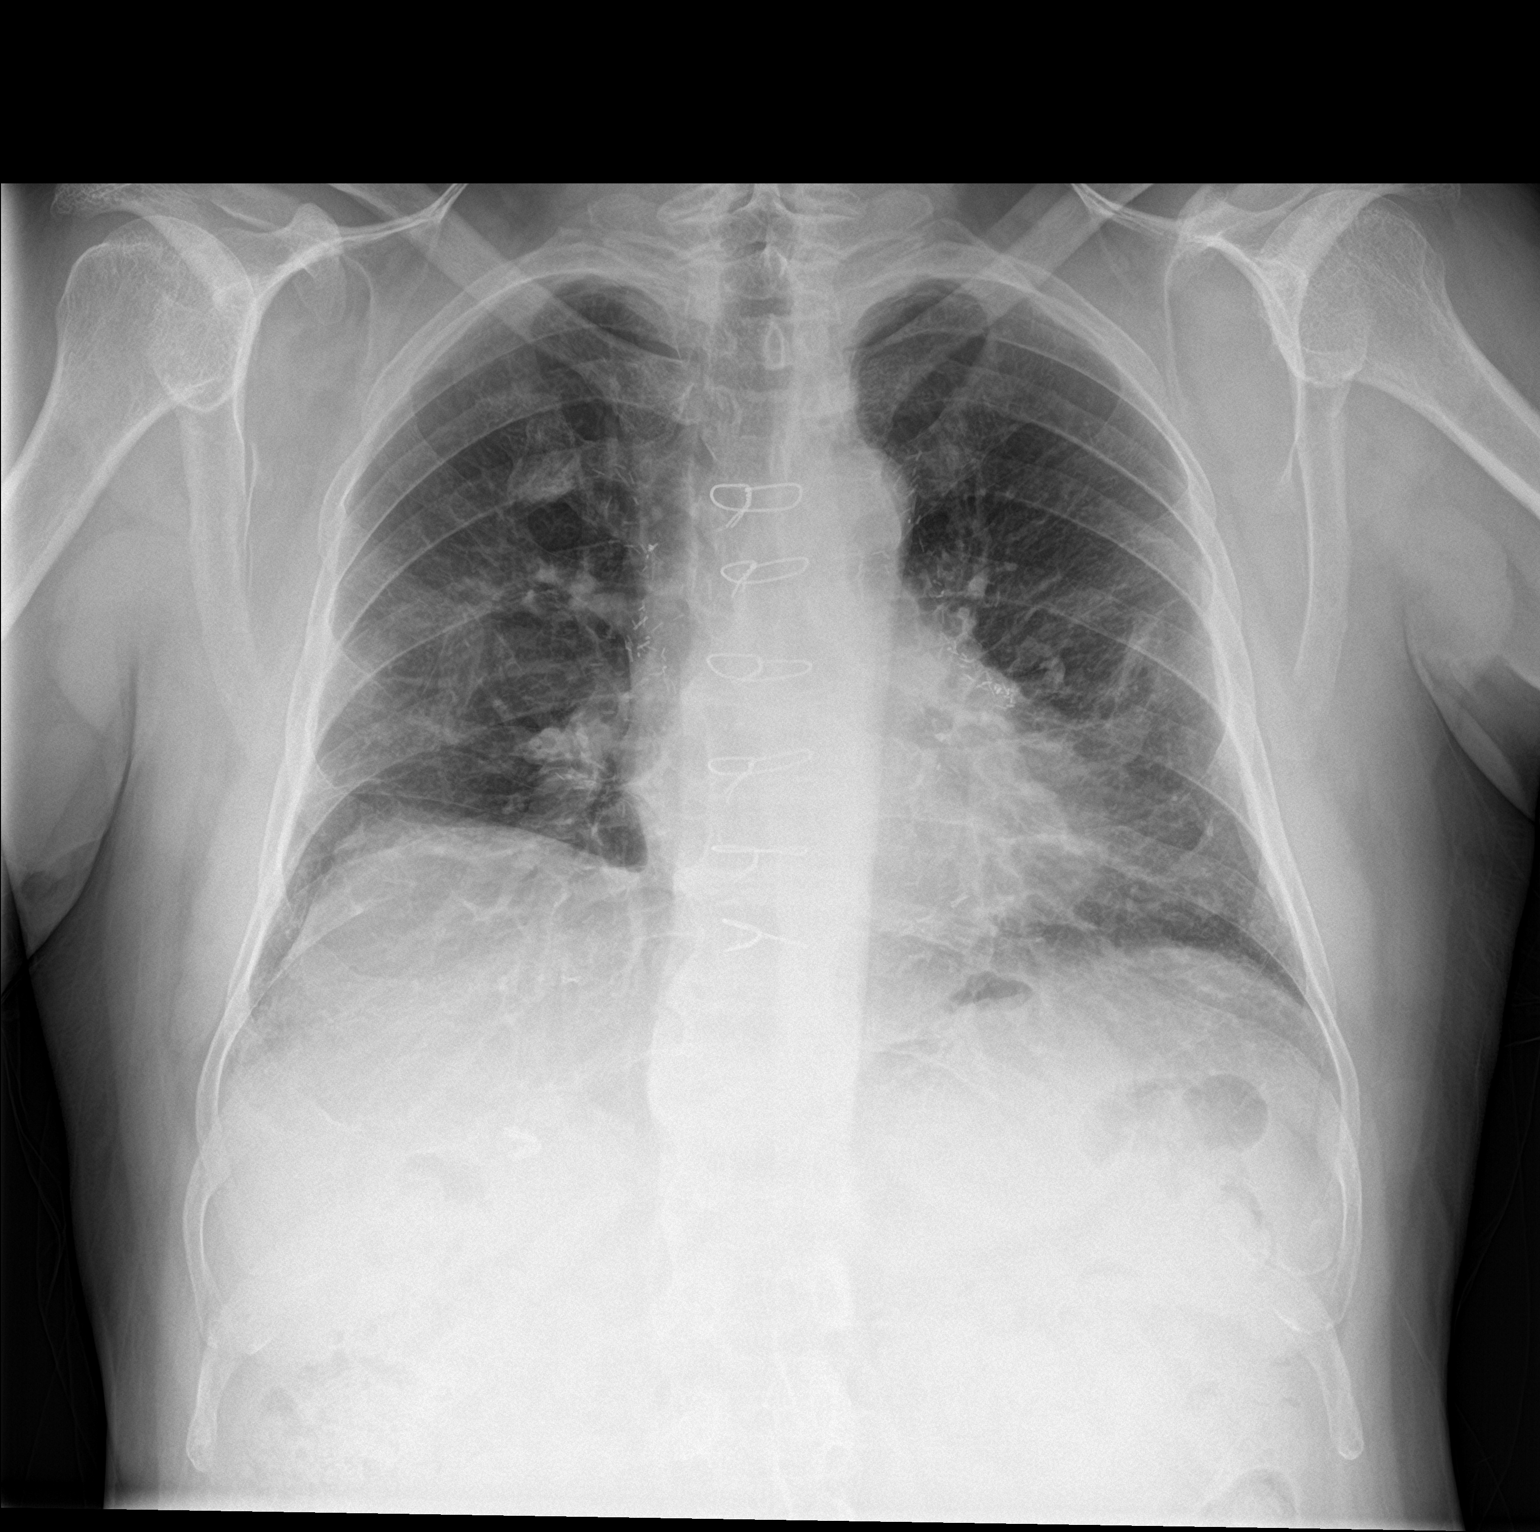

[chest lat]
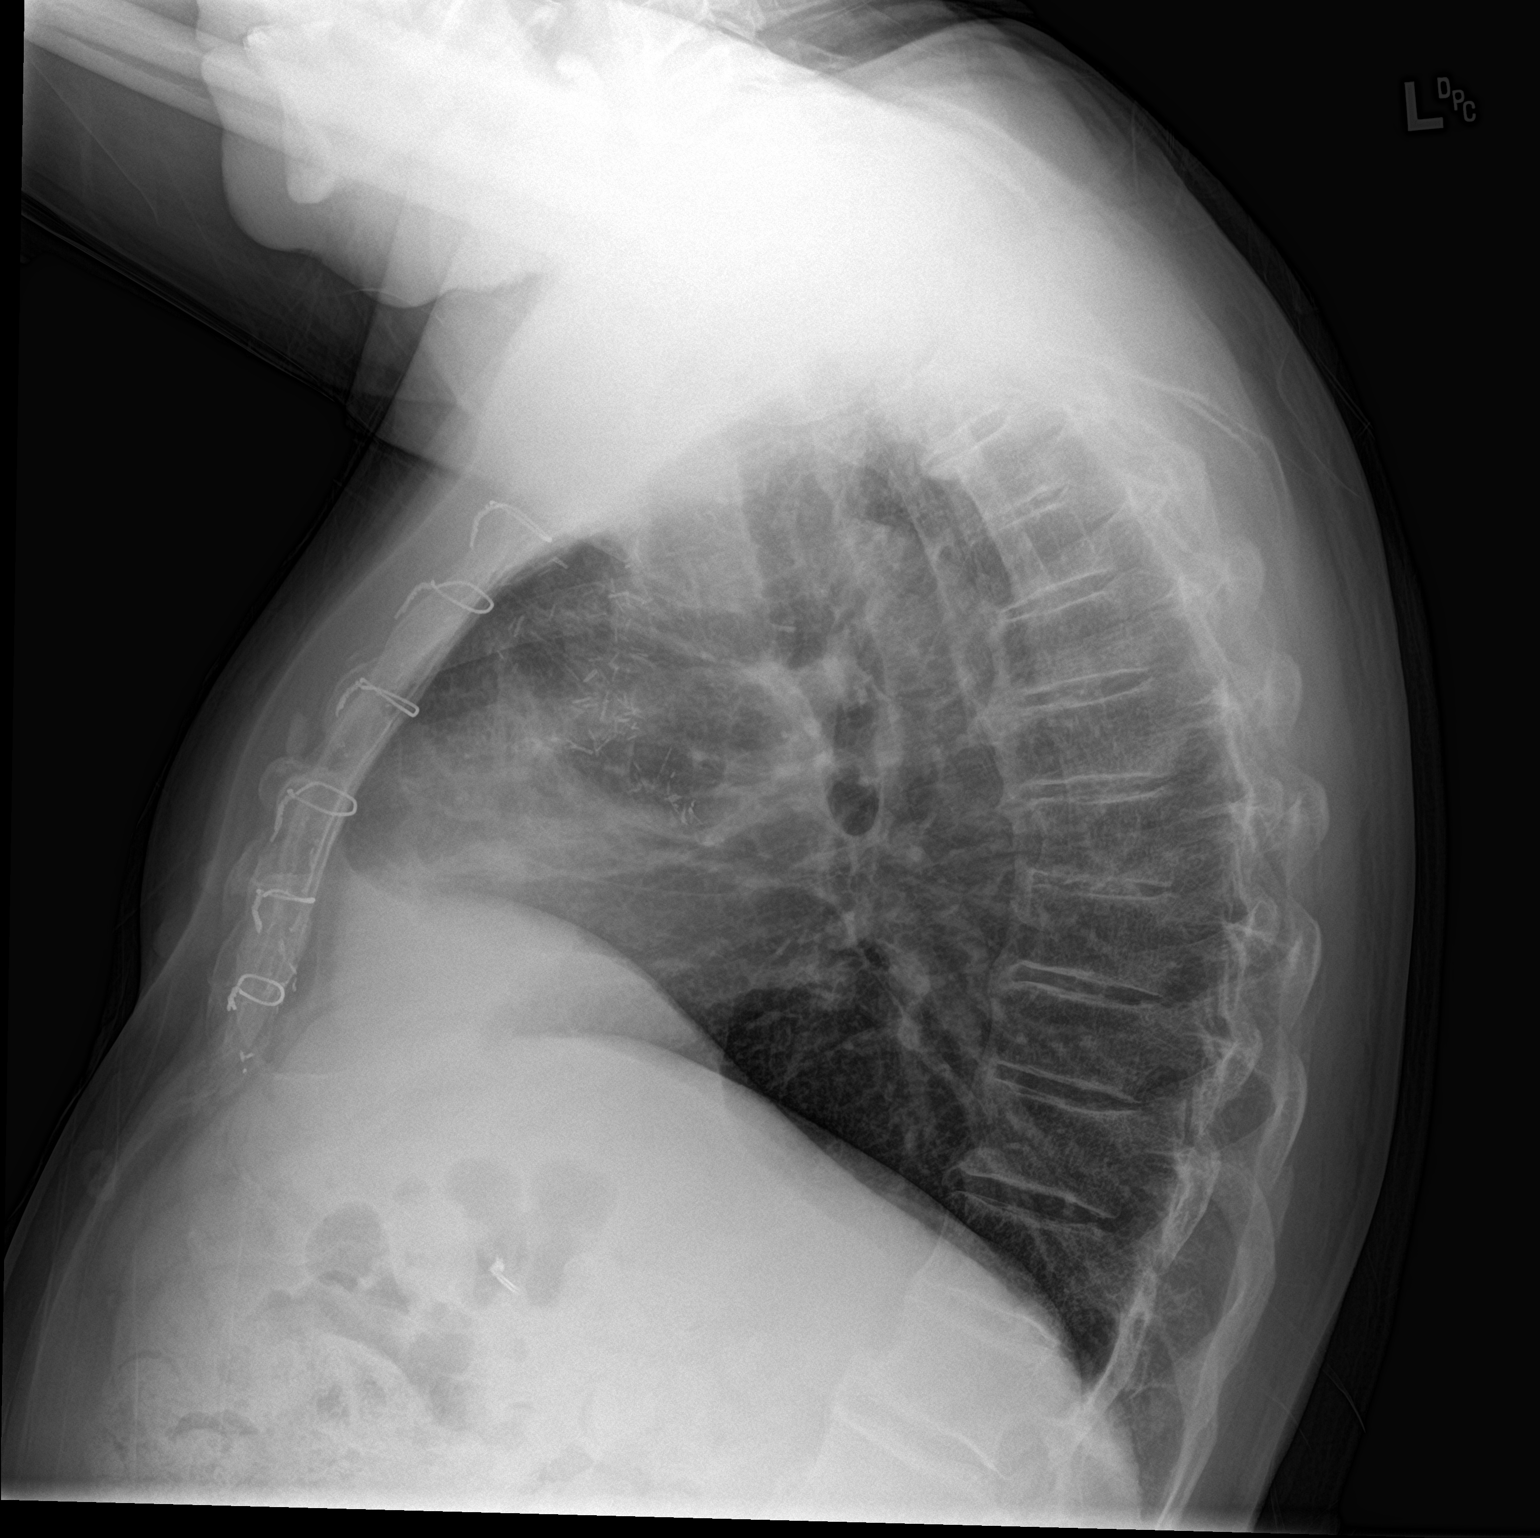

[2 of 2 positions shown; findings below may reference images not displayed]

FINDINGS: Postsurgical changes are again seen. Cardiac shadow is stable. The
lungs are well aerated with mild chronic scarring. No acute
infiltrate or sizable effusion is noted. No bony abnormality is
seen.
IMPRESSION: No active cardiopulmonary disease.

## 2018-04-03 NOTE — Progress Notes (Signed)
[facility]  Patient name: Kenneth [name] MRN: [id number] DOB: 07-06-1941 Sex: male   REASON FOR VISIT:    carotid  HISOTRY OF PRESENT ILLNESS:    Kenneth [name] is a 77 y.o. male who is status post right carotid artery stenting on 06/13/2012 for symptomatic stenosis.  A 9 x 30 stent was placed.  He underwent stenting because of his history of laryngeal cancer, status post neck radiation.  The patient has had follow-up with Dr. Bridgett Larsson who had recommended retrograde right common carotid artery stenting, as the patient has had a new episode of left facial weakness, slurred speech, and left arm weakness.  The symptoms have now resolved except for some left facial sensory loss.  The procedure was delayed because the patient had developed severe laryngeal stenosis.  He has recently undergone tracheostomy placement.  He is on dual antiplatelet therapy.   PAST MEDICAL HISTORY:   Past Medical History:  Diagnosis Date  . Anxiety   . ASCVD (arteriosclerotic cardiovascular disease)   . Coronary artery disease    a. 2003: s/p CABG x3V  b. 2007: cath with patent bypass grafts.  c. 09/2016: [place] with cath showing patent bypass grafts, medical Rx recommended  . Depression   . GERD (gastroesophageal reflux disease)   . High triglycerides   . Hypertension   . 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 Nephrolithiasis   . Peripheral vascular disease ([place])   . Stroke ([place])   . Tobacco abuse   . Tracheal stenosis      FAMILY HISTORY:   Family History  Problem Relation Age of Onset  . Dementia Mother   . Heart disease Father   . Cancer Brother   . Heart disease Brother   . Hyperlipidemia Brother   . Cancer Brother   . Colon cancer Neg Hx     SOCIAL HISTORY:   Social History   Tobacco Use  . Smoking status: Former Smoker    Packs/day: 1.00    Years: 15.00    Pack years: 15.00    Types: Cigarettes    Start date: 04/22/1953   Last attempt to quit: 07/26/1970    Years since quitting: 47.7  . Smokeless tobacco: Former Systems developer    Types: Chew    Quit date: 05/05/2002  Substance Use Topics  . Alcohol use: No    Alcohol/week: 0.0 standard drinks     ALLERGIES:   Allergies  Allergen Reactions  . Nifedipine Other (See Comments)  . Erythromycin   . Lorazepam Other (See Comments)    REACTION: Alters mental status. "Turns into maniac" REACTION: Alters mental status. "Turns into maniac" REACTION: Alters mental status. "Turns into maniac"  . Other     REACTION: Unknown to patient. States that MD states allergy per medical records  . Penicillins Hives    Has patient had a PCN reaction causing immediate rash, facial/tongue/throat swelling, SOB or lightheadedness with hypotension: Yes Has patient had a PCN reaction causing severe rash involving mucus membranes or skin necrosis: No Has patient had a PCN reaction that required hospitalization No Has patient had a PCN reaction occurring within the last 10 years: No If all of the above answers are "NO", then may proceed with Cephalosporin use.  Has patient had a PCN reaction causing immediate rash, facial/tongue/throat swelling, SOB or lightheadedness with hypotension: Yes Has patient had a PCN reaction causing severe rash involving mucus membranes or skin necrosis: No Has patient had a PCN  reaction that required hospitalization No Has patient had a PCN reaction occurring within the last 10 years: No If all of the above answers are "NO", then may proceed with Cephalosporin use.  . Sulfacetamide     REACTION: Unknown to patient. States that MD states allergy per medical records  . Sulfonamide Derivatives Other (See Comments)    REACTION: Unknown to patient. States that MD states allergy per medical records  . Statins Itching and Rash     CURRENT MEDICATIONS:   Current Outpatient Medications  Medication Sig Dispense Refill  . aspirin 325 MG tablet Take 1 tablet (325  mg total) by mouth daily.    . clopidogrel (PLAVIX) 75 MG tablet Take 1 tablet (75 mg total) by mouth daily. 90 tablet 3  . docusate sodium (COLACE) 100 MG capsule Take 100 mg by mouth daily as needed for mild constipation.    . nitroGLYCERIN (NITROSTAT) 0.4 MG SL tablet Place 1 tablet (0.4 mg total) under the tongue every 5 (five) minutes as needed for chest pain. 25 tablet 3  . omeprazole (PRILOSEC) 20 MG capsule Take 20 mg every evening by mouth.     Marland Kitchen PARoxetine (PAXIL) 20 MG tablet Take 20 mg by mouth at bedtime.     Marland Kitchen spironolactone (ALDACTONE) 25 MG tablet Take 25 mg every evening by mouth.    Marland Kitchen amLODipine (NORVASC) 10 MG tablet Take 1 tablet (10 mg total) by mouth daily. 180 tablet 3  . ezetimibe (ZETIA) 10 MG tablet Take 1 tablet (10 mg total) by mouth daily. 90 tablet 3  . Omega-3 Fatty Acids (OMEGA-3 FISH OIL PO) Take 1 capsule by mouth daily.     No current facility-administered medications for this visit.     REVIEW OF SYSTEMS:   [X]  denotes positive finding, [ ]  denotes negative finding Cardiac  Comments:  Chest pain or chest pressure:    Shortness of breath upon exertion:    Short of breath when lying flat:    Irregular heart rhythm:        Vascular    Pain in calf, thigh, or hip brought on by ambulation:    Pain in feet at night that wakes you up from your sleep:     Blood clot in your veins:    Leg swelling:         Pulmonary    Oxygen at home:    Productive cough:     Wheezing:         Neurologic    Sudden weakness in arms or legs:     Sudden numbness in arms or legs:     Sudden onset of difficulty speaking or slurred speech:    Temporary loss of vision in one eye:     Problems with dizziness:         Gastrointestinal    Blood in stool:     Vomited blood:         Genitourinary    Burning when urinating:     Blood in urine:        Psychiatric    Major depression:         Hematologic    Bleeding problems:    Problems with blood clotting too  easily:        Skin    Rashes or ulcers:        Constitutional    Fever or chills:      PHYSICAL EXAM:   Vitals:   04/03/18 0953  04/03/18 0956 04/03/18 0958  BP: 135/76 (!) 147/79 136/81  Pulse: (!) 54 (!) 53 (!) 53  Resp: 20    Temp: 98.6 F (37 C)    TempSrc: Oral    SpO2: 97%    Weight: 180 lb (81.6 kg)    Height: 5\' 6"  (1.676 m)      GENERAL: The patient is a well-nourished male, in no acute distress. The vital signs are documented above. CARDIAC: There is a regular rate and rhythm PULMONARY: Tracheostomy tube in place  ABDOMEN: Soft and non-tender with normal pitched bowel sounds.  MUSCULOSKELETAL: There are no major deformities or cyanosis. NEUROLOGIC: No focal weakness or paresthesias are detected. SKIN: There are no ulcers or rashes noted. PSYCHIATRIC: The patient has a normal affect.  STUDIES:   1. Bulky soft plaque in the Right CCA with estimated 65% stenosis at the level of the subglottic trachea. There is a 6 mm bulky soft plaque ulceration or less likely pseudoaneurysm projecting anteriorly from the mid Right CCA (series 9, image 87). Patent Right ICA stent with mild in stent plaque, no significant stenosis. Up to moderate Right ICA siphon plaque is partially visible. 2. Soft plaque in the Left CCA without stenosis. Soft and calcified plaque at the Left ICA origin with estimated 64% stenosis. Only mild atherosclerosis of the visible Left ICA siphon. 3. High-grade Radiographic String Sign stenosis of the Right Vertebral Artery V4 segment distal to the patent right PICA origin. 4. Left Vertebral artery V4 mild-to-moderate stenosis due to soft plaque proximal to the patent left PICA origin. 5. Stable CT appearance of the neck soft tissues and upper chest since 2017.  MEDICAL ISSUES:   Symptomatic right carotid stenosis: The patient has a history of neck radiation and recent tracheostomy tube.  I do not think he is a candidate for retrograde right  carotid stenting.  I think the most appropriate course of action is to proceed with a right femoral approach and intervention in his right common carotid artery stenosis.  This will need to be done in the operating room because of his recent tracheostomy tube.  I am going to have an anesthesia preoperative evaluation to determine whether or not this needs to be done under general anesthesia.  The patient is scheduled to go back to [facility] next week for follow-up of his tracheostomy.  He wants to have this done in early October.  I will continue his aspirin and Plavix.  The risks and benefits of the operation were discussed with the patient including the risk of stroke.  All of his questions were answered.    Annamarie Major, MD [facility] [phone] Pager [phone]

## 2018-04-03 NOTE — Progress Notes (Signed)
Vitals:   04/03/18 0953 04/03/18 0956  BP: 135/76 (!) 147/79  Pulse: (!) 54 (!) 53  Resp: 20   Temp: 98.6 F (37 C)   TempSrc: Oral   SpO2: 97%   Weight: 180 lb (81.6 kg)   Height: 5\' 6"  (1.676 m)

## 2018-04-05 ENCOUNTER — Telehealth: Payer: Self-pay | Admitting: *Deleted

## 2018-04-05 DIAGNOSIS — Z430 Encounter for attention to tracheostomy: Secondary | ICD-10-CM | POA: Diagnosis not present

## 2018-04-05 DIAGNOSIS — I129 Hypertensive chronic kidney disease with stage 1 through stage 4 chronic kidney disease, or unspecified chronic kidney disease: Secondary | ICD-10-CM | POA: Diagnosis not present

## 2018-04-05 DIAGNOSIS — Z8673 Personal history of transient ischemic attack (TIA), and cerebral infarction without residual deficits: Secondary | ICD-10-CM | POA: Diagnosis not present

## 2018-04-05 DIAGNOSIS — N183 Chronic kidney disease, stage 3 (moderate): Secondary | ICD-10-CM | POA: Diagnosis not present

## 2018-04-05 DIAGNOSIS — I6521 Occlusion and stenosis of right carotid artery: Secondary | ICD-10-CM | POA: Diagnosis not present

## 2018-04-05 DIAGNOSIS — R1312 Dysphagia, oropharyngeal phase: Secondary | ICD-10-CM | POA: Diagnosis not present

## 2018-04-05 DIAGNOSIS — R69 Illness, unspecified: Secondary | ICD-10-CM | POA: Diagnosis not present

## 2018-04-05 DIAGNOSIS — I2510 Atherosclerotic heart disease of native coronary artery without angina pectoris: Secondary | ICD-10-CM | POA: Diagnosis not present

## 2018-04-05 DIAGNOSIS — K219 Gastro-esophageal reflux disease without esophagitis: Secondary | ICD-10-CM | POA: Diagnosis not present

## 2018-04-05 NOTE — Telephone Encounter (Signed)
Call to patient's wife instructed to be at [facility] admitting department at 5:30 am on 05/04/18 for procedure. NPO past MN night prior and to continue Aspirin and Plavix and to take am of this procedure. Expect a call and follow the detailed instructions received from the hospital pre-admission department for this surgery. Verbalized understanding.

## 2018-04-09 DIAGNOSIS — Z930 Tracheostomy status: Secondary | ICD-10-CM | POA: Diagnosis not present

## 2018-04-09 DIAGNOSIS — J398 Other specified diseases of upper respiratory tract: Secondary | ICD-10-CM | POA: Diagnosis not present

## 2018-04-09 DIAGNOSIS — Z8521 Personal history of malignant neoplasm of larynx: Secondary | ICD-10-CM | POA: Diagnosis not present

## 2018-04-11 DIAGNOSIS — R1312 Dysphagia, oropharyngeal phase: Secondary | ICD-10-CM | POA: Diagnosis not present

## 2018-04-11 DIAGNOSIS — R69 Illness, unspecified: Secondary | ICD-10-CM | POA: Diagnosis not present

## 2018-04-11 DIAGNOSIS — K219 Gastro-esophageal reflux disease without esophagitis: Secondary | ICD-10-CM | POA: Diagnosis not present

## 2018-04-11 DIAGNOSIS — I129 Hypertensive chronic kidney disease with stage 1 through stage 4 chronic kidney disease, or unspecified chronic kidney disease: Secondary | ICD-10-CM | POA: Diagnosis not present

## 2018-04-11 DIAGNOSIS — Z8673 Personal history of transient ischemic attack (TIA), and cerebral infarction without residual deficits: Secondary | ICD-10-CM | POA: Diagnosis not present

## 2018-04-11 DIAGNOSIS — I6521 Occlusion and stenosis of right carotid artery: Secondary | ICD-10-CM | POA: Diagnosis not present

## 2018-04-11 DIAGNOSIS — Z430 Encounter for attention to tracheostomy: Secondary | ICD-10-CM | POA: Diagnosis not present

## 2018-04-11 DIAGNOSIS — N183 Chronic kidney disease, stage 3 (moderate): Secondary | ICD-10-CM | POA: Diagnosis not present

## 2018-04-11 DIAGNOSIS — I2510 Atherosclerotic heart disease of native coronary artery without angina pectoris: Secondary | ICD-10-CM | POA: Diagnosis not present

## 2018-04-12 DIAGNOSIS — Z430 Encounter for attention to tracheostomy: Secondary | ICD-10-CM | POA: Diagnosis not present

## 2018-04-13 DIAGNOSIS — J386 Stenosis of larynx: Secondary | ICD-10-CM | POA: Diagnosis not present

## 2018-04-13 DIAGNOSIS — R1310 Dysphagia, unspecified: Secondary | ICD-10-CM | POA: Diagnosis not present

## 2018-04-13 DIAGNOSIS — Z8521 Personal history of malignant neoplasm of larynx: Secondary | ICD-10-CM | POA: Diagnosis not present

## 2018-04-13 DIAGNOSIS — R0600 Dyspnea, unspecified: Secondary | ICD-10-CM | POA: Diagnosis not present

## 2018-04-13 DIAGNOSIS — Z930 Tracheostomy status: Secondary | ICD-10-CM | POA: Diagnosis not present

## 2018-04-17 DIAGNOSIS — J398 Other specified diseases of upper respiratory tract: Secondary | ICD-10-CM | POA: Diagnosis not present

## 2018-04-17 DIAGNOSIS — Z930 Tracheostomy status: Secondary | ICD-10-CM | POA: Diagnosis not present

## 2018-04-17 DIAGNOSIS — Z8521 Personal history of malignant neoplasm of larynx: Secondary | ICD-10-CM | POA: Diagnosis not present

## 2018-04-21 NOTE — Progress Notes (Signed)
Anesthesia Note:   Case:  [id number] Date/Time:  05/04/18 0715   Procedure:  INSERTION OF RIGHT  CAROTID STENT (Right )   Anesthesia type:  General   Pre-op diagnosis:  RIGHT CAROTID STENOSIS   Location:  [facility] OR ROOM 16 / [facility]   Surgeon:  Serafina Mitchell, MD      DISCUSSION: Patient is a 77 year old male scheduled for the above procedure. PAT visit is scheduled for 04/26/18 at 11:00 AM. Anesthesia consult requested due to tracheostomy. Specifically, Dr. Trula Slade wrote, " "Symptomatic right carotid stenosis: The patient has a history of neck radiation and recent tracheostomy tube.  I do not think he is a candidate for retrograde right carotid stenting.  I think the most appropriate course of action is to proceed with a right femoral approach and intervention in his right common carotid artery stenosis.  This will need to be done in the operating room because of his recent tracheostomy tube.  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  Recent stroke work-up included neck CTA showing > 70% proximal CCA stenosis and thought to be a possible cause of his 10/2017 CVA. Vascular surgery recommended retrograde placement of a right CCA stent; however, there was concern about the safety of placing a transfemoral carotid stent given his airway issues/glottic stenosis. He was referred back to his ENT Dr. Rowe Clack for consideration of tracheostomy.  - On 03/06/18, he underwent an awake fiberoptic intubation for elective tracheostomy with Bjork flap and direct laryngoscopy due to glottic  stenosis, bilateral vocal cord paralysis, and dyspnea. (On 03/09/18, his tracheostomy tube was changed from a 6DCT to a 6CFS and he passed a PMV evaluation.)     Further evaluation pending his upcoming PAT visit.   Per VVS, patient to continue ASA and Plavix including day of procedure.   VS: There were no vitals taken for this visit.   PROVIDERS: [name], MD is PCP - Carlyle Dolly, MD is cardiologist. Last visit 12/26/17. Lexiscan ordered for evaluation of chest pain and was low risk. Lopressor discontinued due to bradycardia.  Mila Homer, DO is ENT ([place]).   LABS: PENDING PAT.  (all labs ordered are listed, but only abnormal results are displayed)  Labs Reviewed - No data to display   Spirometry 12/05/15: FVC 2.4 (66%). FEV1 1.8 (64%). Moderate restriction.   IMAGES: CTA neck 01/04/18: IMPRESSION: 1. Bulky soft plaque in the Right CCA with estimated 65% stenosis at the level of the subglottic trachea. There is a 6 mm bulky soft plaque ulceration or less likely pseudoaneurysm projecting anteriorly from the mid Right CCA (series 9, image 87). Patent Right ICA stent with mild in stent plaque, no significant stenosis. Up to moderate Right ICA siphon plaque is partially visible. 2. Soft plaque in the Left CCA without stenosis. Soft and calcified plaque at the Left ICA origin with estimated 64% stenosis. Only mild atherosclerosis of the visible Left ICA siphon. 3. High-grade Radiographic String Sign stenosis of the Right Vertebral Artery V4 segment distal to the patent right PICA origin. 4. Left Vertebral artery V4 mild-to-moderate stenosis due  to soft plaque proximal to the patent left PICA origin. 5. Stable CT appearance of the neck soft tissues and upper chest since 2017.  CXR 11/21/17: IMPRESSION: No active cardiopulmonary disease.   EKG: 11/14/17: SR, PAC. IVCD, consider atypical right BBB. Tracing appears similar to 10/06/16.     CV: Nuclear stress test 12/30/17:  No diagnostic ST segment changes to indicate ischemia.  No significant myocardial perfusion defects to indicate scar or ischemia.  This is a low risk study.  Nuclear stress EF: 67%.  30 day Event monitor 11/26/17-12/25/17:   30 day event monitor  Min HR 44, Max HR 122, Avg HR 62. Min HR in early AM hours presumably while asleep  No symptoms reported  Available tracings show sinus rhythm, rare PACs and rare PVCs  No significant arrhythmias  Echo 11/15/17: Study Conclusions - Left ventricle: The cavity size was normal. Wall thickness was   normal. Systolic function was normal. The estimated ejection   fraction was in the range of 60% to 65%. Wall motion was normal;   there were no regional wall motion abnormalities. Left   ventricular diastolic function parameters were normal. - Aortic valve: Mildly calcified annulus. Trileaflet; mildly   thickened leaflets. Valve area (VTI): 3.01 cm^2. Valve area   (Vmax): 2.24 cm^2. Valve area (Vmean): 2.3 cm^2. - Mitral valve: Mildly calcified annulus. Mildly thickened leaflets. - Left atrium: The atrium was mildly dilated.  Cardiac cath 10/07/16:  LM lesion, 50 %stenosed.  Prox LAD to Mid LAD lesion, 70 %stenosed.  Ost 1st Mrg lesion, 100 %stenosed.  RIMA and is normal in caliber and anatomically normal.  RPDA-2 lesion, 50 %stenosed.  RPDA-1 lesion, 60 %stenosed.  LIMA.  The left ventricular systolic function is normal.  LV end diastolic pressure is normal. - Normal LV function without focal segmental wall motion abnormalities. - Multivessel  native CAD with 50% ostial stenosis of the LAD and diffuse 70% mid stenoses, occlusion of the OM1 vessel of the circumflex, and diffuse 60 and 50% PDA stenoses in the small caliber PDA vessel. - Patent sequential LIMA graft supplying a twin like diagonal and LAD system system. - Patent RIMA graft supplying the circumflex marginal  vessel. RECOMMENDATION: Medical therapy.   Past Medical History:  Diagnosis Date  . Anxiety   . ASCVD (arteriosclerotic cardiovascular disease)   . Coronary artery disease    a. 2003: s/p CABG x3V  b. 2007: cath with patent bypass grafts.  c. 09/2016: [place] with cath showing patent bypass grafts, medical Rx recommended  . Depression   . GERD (gastroesophageal reflux disease)   . High triglycerides   . Hypertension   . Laryngeal carcinoma ([place])   . Nephrolithiasis   . Peripheral vascular disease ([place])   . Stroke ([place])   . Tobacco abuse   . Tracheal stenosis     Past Surgical History:  Procedure Laterality Date  . CARDIAC SURGERY    . CAROTID STENT Right 06-13-12  . CAROTID STENT INSERTION N/A 06/13/2012   Procedure: CAROTID STENT INSERTION;  Surgeon: Serafina Mitchell, MD;  Location: [facility] CATH LAB;  Service: Cardiovascular;  Laterality: N/A;  . CHOLECYSTECTOMY  2007   Gall Bladder  . COLONOSCOPY  11/10/2011   Dr. [name]: hemorrhoids, tubular adenoma  . CORONARY ARTERY BYPASS GRAFT    . EYE SURGERY    . LEFT HEART CATH AND CORS/GRAFTS ANGIOGRAPHY N/A 10/07/2016   Procedure: Left Heart Cath and Cors/Grafts Angiography;  Surgeon: Troy Sine, MD;  Location:  [place] INVASIVE CV LAB;  Service: Cardiovascular;  Laterality: N/A;  . PARTIAL LARYNGECTOMY  1998    MEDICATIONS: . amLODipine (NORVASC) 10 MG tablet  . aspirin 325 MG tablet  . clopidogrel (PLAVIX) 75 MG tablet  . docusate sodium (COLACE) 100 MG capsule  . ezetimibe (ZETIA) 10 MG tablet  . nitroGLYCERIN (NITROSTAT) 0.4 MG SL tablet  . Omega-3 Fatty Acids (OMEGA-3 FISH OIL PO)  . omeprazole (PRILOSEC) 20 MG capsule  . PARoxetine (PAXIL) 20 MG tablet  . spironolactone (ALDACTONE) 25 MG tablet   No current facility-administered medications for this encounter.     George Hugh [facility] Short Stay Center/Anesthesiology Phone [phone] 04/21/2018 5:25 PM

## 2018-04-25 ENCOUNTER — Encounter (HOSPITAL_COMMUNITY): Payer: Self-pay

## 2018-04-25 DIAGNOSIS — Z23 Encounter for immunization: Secondary | ICD-10-CM | POA: Diagnosis not present

## 2018-04-25 NOTE — Pre-Procedure Instructions (Addendum)
Kenneth [name]  04/25/2018      [place], [place] [address] [place] [address] Phone: [phone] Fax: [place], [place] [address] [place] [address] Phone: [phone] Fax: [phone]    Your procedure is scheduled on October 10th.  Report to [facility] Admitting at 5:30 A.M.  Call this number if you have problems the morning of surgery:  [phone]   Remember:  Do not eat or drink after midnight.     Take these medicines the morning of surgery with A SIP OF WATER     Amlodipine (Norvasc)  Zetia  Prilosec  Paroxetine (Paxil)  Aspirin  Plavix  7 days prior to surgery STOP taking any Aleve, Naproxen, Ibuprofen, Motrin, Advil, Goody's, BC's, all herbal medications, fish oil, and all vitamins      Do not wear jewelry, make-up or nail polish.  Do not wear lotions, powders, or perfumes, or deodorant.  Do not shave 48 hours prior to surgery.  Men may shave face and neck.  Do not bring valuables to the hospital.  [facility] is not responsible for any belongings or valuables.  Contacts, dentures or bridgework may not be worn into surgery.  Leave your suitcase in the car.  After surgery it may be brought to your room.  For patients admitted to the hospital, discharge time will be determined by your treatment team.   [facility]- Preparing For Surgery  Before surgery, you can play an important role. Because skin is not sterile, your skin needs to be as free of germs as possible. You can reduce the number of germs on your skin by washing with CHG (chlorahexidine gluconate) Soap before surgery.  CHG is an antiseptic cleaner which kills germs and bonds with the skin to continue killing germs even after washing.    Oral Hygiene is also important to reduce your risk of infection.  Remember - BRUSH YOUR TEETH THE MORNING OF  SURGERY WITH YOUR REGULAR TOOTHPASTE  Please do not use if you have an allergy to CHG or antibacterial soaps. If your skin becomes reddened/irritated stop using the CHG.  Do not shave (including legs and underarms) for at least 48 hours prior to first CHG shower. It is OK to shave your face.  Please follow these instructions carefully.   1. Shower the NIGHT BEFORE SURGERY and the MORNING OF SURGERY with CHG.   2. If you chose to wash your hair, wash your hair first as usual with your normal shampoo.  3. After you shampoo, rinse your hair and body thoroughly to remove the shampoo.  4. Use CHG as you would any other liquid soap. You can apply CHG directly to the skin and wash gently with a scrungie or a clean washcloth.   5. Apply the CHG Soap to your body ONLY FROM THE NECK DOWN.  Do not use on open wounds or open sores. Avoid contact with your eyes, ears, mouth and genitals (private parts). Wash Face and genitals (private parts)  with your normal soap.  6. Wash thoroughly, paying special attention to the area where your surgery will be performed.  7. Thoroughly rinse your body with warm water from the neck down.  8. DO NOT shower/wash with your normal soap after using and rinsing off the CHG Soap.  9. Pat yourself dry with a  CLEAN TOWEL.  10. Wear CLEAN PAJAMAS to bed the night before surgery, wear comfortable clothes the morning of surgery  11. Place CLEAN SHEETS on your bed the night of your first shower and DO NOT SLEEP WITH PETS.    Day of Surgery:  Do not apply any deodorants/lotions.  Please wear clean clothes to the hospital/surgery center.   Remember to brush your teeth WITH YOUR REGULAR TOOTHPASTE.   Patients discharged the day of surgery will not be allowed to drive home.   Please read over the following fact sheets that you were given. Coughing and Deep Breathing and Surgical Site Infection Prevention

## 2018-04-26 ENCOUNTER — Encounter (HOSPITAL_COMMUNITY)
Admission: RE | Admit: 2018-04-26 | Discharge: 2018-04-26 | Disposition: A | Discharge status: Home / Self Care | Payer: Medicare HMO | Source: Ambulatory Visit | Attending: Surgery | Admitting: Surgery

## 2018-04-26 ENCOUNTER — Encounter (HOSPITAL_COMMUNITY): Payer: Self-pay

## 2018-04-26 ENCOUNTER — Other Ambulatory Visit: Payer: Self-pay

## 2018-04-26 DIAGNOSIS — Z9889 Other specified postprocedural states: Secondary | ICD-10-CM | POA: Insufficient documentation

## 2018-04-26 DIAGNOSIS — Z79899 Other long term (current) drug therapy: Secondary | ICD-10-CM | POA: Insufficient documentation

## 2018-04-26 DIAGNOSIS — K219 Gastro-esophageal reflux disease without esophagitis: Secondary | ICD-10-CM | POA: Diagnosis not present

## 2018-04-26 DIAGNOSIS — Z01812 Encounter for preprocedural laboratory examination: Secondary | ICD-10-CM | POA: Diagnosis not present

## 2018-04-26 DIAGNOSIS — I6521 Occlusion and stenosis of right carotid artery: Secondary | ICD-10-CM | POA: Diagnosis not present

## 2018-04-26 DIAGNOSIS — Z7982 Long term (current) use of aspirin: Secondary | ICD-10-CM | POA: Diagnosis not present

## 2018-04-26 DIAGNOSIS — I10 Essential (primary) hypertension: Secondary | ICD-10-CM | POA: Diagnosis not present

## 2018-04-26 HISTORY — DX: Unspecified osteoarthritis, unspecified site: M19.90

## 2018-04-26 LAB — CBC
HCT: 41.8 % (ref 39.0–52.0)
Hemoglobin: 13.4 g/dL (ref 13.0–17.0)
MCH: 31.4 pg (ref 26.0–34.0)
MCHC: 32.1 g/dL (ref 30.0–36.0)
MCV: 97.9 fL (ref 78.0–100.0)
Platelets: 284 10*3/uL (ref 150–400)
RBC: 4.27 MIL/uL (ref 4.22–5.81)
RDW: 13.1 % (ref 11.5–15.5)
WBC: 10.7 10*3/uL — ABNORMAL HIGH (ref 4.0–10.5)

## 2018-04-26 LAB — COMPREHENSIVE METABOLIC PANEL
ALT: 24 U/L (ref 0–44)
AST: 20 U/L (ref 15–41)
Albumin: 3.8 g/dL (ref 3.5–5.0)
Alkaline Phosphatase: 120 U/L (ref 38–126)
Anion gap: 9 (ref 5–15)
BUN: 17 mg/dL (ref 8–23)
CO2: 24 mmol/L (ref 22–32)
Calcium: 9.4 mg/dL (ref 8.9–10.3)
Chloride: 102 mmol/L (ref 98–111)
Creatinine, Ser: 1.31 mg/dL — ABNORMAL HIGH (ref 0.61–1.24)
GFR calc Af Amer: 58 mL/min — ABNORMAL LOW (ref >60)
GFR calc non Af Amer: 50 mL/min — ABNORMAL LOW (ref >60)
Glucose, Bld: 112 mg/dL — ABNORMAL HIGH (ref 70–99)
Potassium: 4.6 mmol/L (ref 3.5–5.1)
Sodium: 135 mmol/L (ref 135–145)
Total Bilirubin: 0.5 mg/dL (ref 0.3–1.2)
Total Protein: 7.1 g/dL (ref 6.5–8.1)

## 2018-04-26 LAB — URINALYSIS, ROUTINE W REFLEX MICROSCOPIC
Bilirubin Urine: NEGATIVE
Glucose, UA: NEGATIVE mg/dL
Hgb urine dipstick: NEGATIVE
Ketones, ur: NEGATIVE mg/dL
Nitrite: NEGATIVE
Protein, ur: NEGATIVE mg/dL
Specific Gravity, Urine: 1.009 (ref 1.005–1.030)
WBC, UA: >50 WBC/hpf — ABNORMAL HIGH (ref 0–5)
pH: 5 (ref 5.0–8.0)

## 2018-04-26 LAB — TYPE AND SCREEN
ABO/RH(D): B POS
Antibody Screen: NEGATIVE

## 2018-04-26 LAB — PROTIME-INR
INR: 0.84
Prothrombin Time: 11.5 seconds (ref 11.4–15.2)

## 2018-04-26 LAB — ABO/RH: ABO/RH(D): B POS

## 2018-04-26 LAB — APTT: aPTT: 27 seconds (ref 24–36)

## 2018-04-26 LAB — SURGICAL PCR SCREEN
MRSA, PCR: NEGATIVE
Staphylococcus aureus: NEGATIVE

## 2018-04-26 NOTE — Progress Notes (Signed)
PCP: [name]  Cardiologist: Dr. [name]  Pt recently received tracheostomy in Aug. 2019. Doing well.  Per Dr. Trula Slade, pt is to continue ASA & Plavix including day of surgery.  Per Dr. Trula Slade, pt to receive anesthesia consult during PAT appointment.  James notified and coming to see pt.    Pt denies SOB, cough, fever, or chest pain  Pt stated understanding of instructions for day of surgery.

## 2018-04-26 NOTE — Progress Notes (Addendum)
See Allyn Kenner note from 04/21/2018 for comprehensive review of pt's health history. Pt's otolaryngologist is Dr. Mila Homer at [facility]. I called her office today to confirm what type of trach the pt has. Per Dr. Rowe Clack, the pt's trach is a Shiley #6 uncuffed. She also confirmed that the stoma is mature enough for the trach to be removed to allow placement of ETT tube. I discussed with Dr. Suzette Battiest and confirmed that the plan will be to do the procedure under general anesthesia.   I also spoke with the pt at his PAT appt today to review anesthesia plan. He reports overall he is feeling well, breathing much better since trach placement. He does report a persistent cough that is occasionally productive of blood-tinged sputum. Dr. Rowe Clack is aware.  I anticipate he can proceed with surgery under general anesthesia as planned. Dr. Stephens Shire office notified of anesthesia plan.  Wynonia Musty [facility] Short Stay Center/Anesthesiology Phone [phone] 04/26/2018 1:54 PM

## 2018-05-04 ENCOUNTER — Other Ambulatory Visit: Payer: Self-pay

## 2018-05-04 ENCOUNTER — Inpatient Hospital Stay (HOSPITAL_COMMUNITY): Payer: Medicare HMO | Admitting: Vascular Surgery

## 2018-05-04 ENCOUNTER — Inpatient Hospital Stay (HOSPITAL_COMMUNITY)
Admission: RE | Admit: 2018-05-04 | Discharge: 2018-05-05 | DRG: 036 | Disposition: A | Discharge status: Home / Self Care | Payer: Medicare HMO | Attending: Surgery | Admitting: Surgery

## 2018-05-04 ENCOUNTER — Inpatient Hospital Stay (HOSPITAL_COMMUNITY): Payer: Medicare HMO | Admitting: Registered Nurse

## 2018-05-04 ENCOUNTER — Ambulatory Visit: Payer: Medicare HMO | Admitting: Cardiology

## 2018-05-04 ENCOUNTER — Encounter (HOSPITAL_COMMUNITY): Payer: Self-pay | Admitting: Anesthesiology

## 2018-05-04 ENCOUNTER — Encounter (HOSPITAL_COMMUNITY)
Admission: RE | Disposition: A | Discharge status: Home / Self Care | Payer: Self-pay | Source: Home / Self Care | Attending: Vascular Surgery

## 2018-05-04 DIAGNOSIS — R29810 Facial weakness: Secondary | ICD-10-CM | POA: Diagnosis present

## 2018-05-04 DIAGNOSIS — I2510 Atherosclerotic heart disease of native coronary artery without angina pectoris: Secondary | ICD-10-CM | POA: Diagnosis not present

## 2018-05-04 DIAGNOSIS — F329 Major depressive disorder, single episode, unspecified: Secondary | ICD-10-CM | POA: Diagnosis present

## 2018-05-04 DIAGNOSIS — Z7902 Long term (current) use of antithrombotics/antiplatelets: Secondary | ICD-10-CM

## 2018-05-04 DIAGNOSIS — Z882 Allergy status to sulfonamides status: Secondary | ICD-10-CM | POA: Diagnosis not present

## 2018-05-04 DIAGNOSIS — M545 Low back pain: Secondary | ICD-10-CM | POA: Diagnosis not present

## 2018-05-04 DIAGNOSIS — Z8249 Family history of ischemic heart disease and other diseases of the circulatory system: Secondary | ICD-10-CM

## 2018-05-04 DIAGNOSIS — Z888 Allergy status to other drugs, medicaments and biological substances status: Secondary | ICD-10-CM

## 2018-05-04 DIAGNOSIS — Z87891 Personal history of nicotine dependence: Secondary | ICD-10-CM

## 2018-05-04 DIAGNOSIS — F419 Anxiety disorder, unspecified: Secondary | ICD-10-CM | POA: Diagnosis present

## 2018-05-04 DIAGNOSIS — Z8521 Personal history of malignant neoplasm of larynx: Secondary | ICD-10-CM

## 2018-05-04 DIAGNOSIS — Z430 Encounter for attention to tracheostomy: Secondary | ICD-10-CM | POA: Diagnosis not present

## 2018-05-04 DIAGNOSIS — Z7982 Long term (current) use of aspirin: Secondary | ICD-10-CM

## 2018-05-04 DIAGNOSIS — E781 Pure hyperglyceridemia: Secondary | ICD-10-CM | POA: Diagnosis present

## 2018-05-04 DIAGNOSIS — I720 Aneurysm of carotid artery: Secondary | ICD-10-CM | POA: Diagnosis present

## 2018-05-04 DIAGNOSIS — Z930 Tracheostomy status: Secondary | ICD-10-CM | POA: Diagnosis not present

## 2018-05-04 DIAGNOSIS — Z87442 Personal history of urinary calculi: Secondary | ICD-10-CM

## 2018-05-04 DIAGNOSIS — Z809 Family history of malignant neoplasm, unspecified: Secondary | ICD-10-CM

## 2018-05-04 DIAGNOSIS — Z881 Allergy status to other antibiotic agents status: Secondary | ICD-10-CM | POA: Diagnosis not present

## 2018-05-04 DIAGNOSIS — I739 Peripheral vascular disease, unspecified: Secondary | ICD-10-CM | POA: Diagnosis present

## 2018-05-04 DIAGNOSIS — J386 Stenosis of larynx: Secondary | ICD-10-CM | POA: Diagnosis not present

## 2018-05-04 DIAGNOSIS — Z8349 Family history of other endocrine, nutritional and metabolic diseases: Secondary | ICD-10-CM

## 2018-05-04 DIAGNOSIS — I6521 Occlusion and stenosis of right carotid artery: Secondary | ICD-10-CM | POA: Diagnosis not present

## 2018-05-04 DIAGNOSIS — Z79899 Other long term (current) drug therapy: Secondary | ICD-10-CM

## 2018-05-04 DIAGNOSIS — Z951 Presence of aortocoronary bypass graft: Secondary | ICD-10-CM | POA: Diagnosis not present

## 2018-05-04 DIAGNOSIS — Z95820 Peripheral vascular angioplasty status with implants and grafts: Secondary | ICD-10-CM

## 2018-05-04 DIAGNOSIS — Z923 Personal history of irradiation: Secondary | ICD-10-CM

## 2018-05-04 DIAGNOSIS — R82998 Other abnormal findings in urine: Secondary | ICD-10-CM | POA: Diagnosis present

## 2018-05-04 DIAGNOSIS — R69 Illness, unspecified: Secondary | ICD-10-CM | POA: Diagnosis not present

## 2018-05-04 DIAGNOSIS — I63239 Cerebral infarction due to unspecified occlusion or stenosis of unspecified carotid arteries: Secondary | ICD-10-CM | POA: Diagnosis present

## 2018-05-04 DIAGNOSIS — I10 Essential (primary) hypertension: Secondary | ICD-10-CM | POA: Diagnosis not present

## 2018-05-04 DIAGNOSIS — G8929 Other chronic pain: Secondary | ICD-10-CM | POA: Diagnosis not present

## 2018-05-04 DIAGNOSIS — I69398 Other sequelae of cerebral infarction: Secondary | ICD-10-CM

## 2018-05-04 DIAGNOSIS — Z9002 Acquired absence of larynx: Secondary | ICD-10-CM

## 2018-05-04 DIAGNOSIS — K219 Gastro-esophageal reflux disease without esophagitis: Secondary | ICD-10-CM | POA: Diagnosis present

## 2018-05-04 DIAGNOSIS — Z880 Allergy status to penicillin: Secondary | ICD-10-CM

## 2018-05-04 HISTORY — DX: Personal history of urinary calculi: Z87.442

## 2018-05-04 HISTORY — DX: Cerebral infarction, unspecified: I63.9

## 2018-05-04 HISTORY — DX: Unspecified abnormal findings in urine: R82.90

## 2018-05-04 HISTORY — DX: Low back pain, unspecified: M54.50

## 2018-05-04 HISTORY — PX: INSERTION OF RETROGRADE CAROTID STENT: SHX5869

## 2018-05-04 HISTORY — DX: Low back pain: M54.5

## 2018-05-04 HISTORY — DX: Other chronic pain: G89.29

## 2018-05-04 LAB — CREATININE, SERUM
Creatinine, Ser: 1.46 mg/dL — ABNORMAL HIGH (ref 0.61–1.24)
GFR calc Af Amer: 51 mL/min — ABNORMAL LOW (ref >60)
GFR calc non Af Amer: 44 mL/min — ABNORMAL LOW (ref >60)

## 2018-05-04 LAB — CBC
HCT: 36.6 % — ABNORMAL LOW (ref 39.0–52.0)
Hemoglobin: 11.6 g/dL — ABNORMAL LOW (ref 13.0–17.0)
MCH: 30.1 pg (ref 26.0–34.0)
MCHC: 31.7 g/dL (ref 30.0–36.0)
MCV: 95.1 fL (ref 80.0–100.0)
Platelets: 287 10*3/uL (ref 150–400)
RBC: 3.85 MIL/uL — ABNORMAL LOW (ref 4.22–5.81)
RDW: 13.2 % (ref 11.5–15.5)
WBC: 10.6 10*3/uL — ABNORMAL HIGH (ref 4.0–10.5)
nRBC: 0 % (ref 0.0–0.2)

## 2018-05-04 LAB — POCT ACTIVATED CLOTTING TIME
Activated Clotting Time: 208 seconds
Activated Clotting Time: 246 seconds
Activated Clotting Time: 230 seconds

## 2018-05-04 SURGERY — INSERTION, STENT, ARTERY, CAROTID, RETROGRADE
Anesthesia: General | Site: Neck | Laterality: Right

## 2018-05-04 MED ORDER — METOPROLOL TARTRATE 25 MG PO TABS
25.0000 mg | ORAL_TABLET | Freq: Two times a day (BID) | ORAL | Status: DC
  Administered 2018-05-04: 25 mg via ORAL
  Filled 2018-05-04: qty 1

## 2018-05-04 MED ORDER — ROCURONIUM BROMIDE 10 MG/ML (PF) SYRINGE
PREFILLED_SYRINGE | INTRAVENOUS | Status: DC | PRN
  Administered 2018-05-04: 20 mg via INTRAVENOUS
  Administered 2018-05-04: 25 mg via INTRAVENOUS
  Administered 2018-05-04: 10 mg via INTRAVENOUS
  Administered 2018-05-04: 25 mg via INTRAVENOUS

## 2018-05-04 MED ORDER — METOPROLOL TARTRATE 5 MG/5ML IV SOLN: 2.0000 mg | INTRAVENOUS | Status: DC | PRN

## 2018-05-04 MED ORDER — EPHEDRINE SULFATE-NACL 50-0.9 MG/10ML-% IV SOSY
PREFILLED_SYRINGE | INTRAVENOUS | Status: DC | PRN
  Administered 2018-05-04 (×3): 5 mg via INTRAVENOUS

## 2018-05-04 MED ORDER — SODIUM CHLORIDE 0.9 % IV SOLN
INTRAVENOUS | Status: AC
  Filled 2018-05-04: qty 1.2

## 2018-05-04 MED ORDER — HEPARIN SODIUM (PORCINE) 1000 UNIT/ML IJ SOLN
INTRAMUSCULAR | Status: DC | PRN
  Administered 2018-05-04: 3000 [IU] via INTRAVENOUS
  Administered 2018-05-04: 1000 [IU] via INTRAVENOUS
  Administered 2018-05-04: 9000 [IU] via INTRAVENOUS

## 2018-05-04 MED ORDER — FENTANYL CITRATE (PF) 250 MCG/5ML IJ SOLN
INTRAMUSCULAR | Status: AC
  Filled 2018-05-04: qty 5

## 2018-05-04 MED ORDER — SODIUM CHLORIDE 0.9 % IV SOLN
INTRAVENOUS | Status: DC | PRN
  Administered 2018-05-04: 09:00:00

## 2018-05-04 MED ORDER — NITROGLYCERIN 0.4 MG SL SUBL: 0.4000 mg | SUBLINGUAL_TABLET | SUBLINGUAL | Status: DC | PRN

## 2018-05-04 MED ORDER — FENTANYL CITRATE (PF) 100 MCG/2ML IJ SOLN: 25.0000 ug | INTRAMUSCULAR | Status: DC | PRN

## 2018-05-04 MED ORDER — PAROXETINE HCL 20 MG PO TABS
20.0000 mg | ORAL_TABLET | Freq: Every day | ORAL | Status: DC
  Administered 2018-05-04: 20 mg via ORAL
  Filled 2018-05-04: qty 1

## 2018-05-04 MED ORDER — SODIUM CHLORIDE 0.9 % IV SOLN: 500.0000 mL | Freq: Once | INTRAVENOUS | Status: DC | PRN

## 2018-05-04 MED ORDER — HYDRALAZINE HCL 20 MG/ML IJ SOLN: 5.0000 mg | INTRAMUSCULAR | Status: DC | PRN

## 2018-05-04 MED ORDER — SUGAMMADEX SODIUM 200 MG/2ML IV SOLN
INTRAVENOUS | Status: DC | PRN
  Administered 2018-05-04: 200 mg via INTRAVENOUS

## 2018-05-04 MED ORDER — ASPIRIN 325 MG PO TABS: 325.0000 mg | ORAL_TABLET | Freq: Every day | ORAL | Status: DC

## 2018-05-04 MED ORDER — SODIUM CHLORIDE 0.9 % IV SOLN
0.0500 ug/kg/min | INTRAVENOUS | Status: AC
  Administered 2018-05-04: .2 ug/kg/min via INTRAVENOUS
  Filled 2018-05-04: qty 1000

## 2018-05-04 MED ORDER — GUAIFENESIN-DM 100-10 MG/5ML PO SYRP: 15.0000 mL | ORAL_SOLUTION | ORAL | Status: DC | PRN

## 2018-05-04 MED ORDER — PAROXETINE HCL 20 MG PO TABS: 20.0000 mg | ORAL_TABLET | Freq: Every day | ORAL | Status: DC

## 2018-05-04 MED ORDER — PANTOPRAZOLE SODIUM 40 MG PO TBEC: 40.0000 mg | DELAYED_RELEASE_TABLET | Freq: Every day | ORAL | Status: DC

## 2018-05-04 MED ORDER — SODIUM CHLORIDE 0.9 % IR SOLN
Status: DC | PRN
  Administered 2018-05-04: 1000 mL

## 2018-05-04 MED ORDER — POTASSIUM CHLORIDE CRYS ER 20 MEQ PO TBCR: 20.0000 meq | EXTENDED_RELEASE_TABLET | Freq: Every day | ORAL | Status: DC | PRN

## 2018-05-04 MED ORDER — GLYCOPYRROLATE PF 0.2 MG/ML IJ SOSY
PREFILLED_SYRINGE | INTRAMUSCULAR | Status: DC | PRN
  Administered 2018-05-04 (×2): .1 mg via INTRAVENOUS

## 2018-05-04 MED ORDER — ALUM & MAG HYDROXIDE-SIMETH 200-200-20 MG/5ML PO SUSP: 15.0000 mL | ORAL | Status: DC | PRN

## 2018-05-04 MED ORDER — PROPOFOL 10 MG/ML IV BOLUS
INTRAVENOUS | Status: DC | PRN
  Administered 2018-05-04: 70 mg via INTRAVENOUS

## 2018-05-04 MED ORDER — HEPARIN SODIUM (PORCINE) 1000 UNIT/ML IJ SOLN
INTRAMUSCULAR | Status: AC
  Filled 2018-05-04: qty 1

## 2018-05-04 MED ORDER — CHLORHEXIDINE GLUCONATE 4 % EX LIQD: 60.0000 mL | Freq: Once | CUTANEOUS | Status: DC

## 2018-05-04 MED ORDER — AMLODIPINE BESYLATE 10 MG PO TABS: 10.0000 mg | ORAL_TABLET | Freq: Every day | ORAL | Status: DC

## 2018-05-04 MED ORDER — DOCUSATE SODIUM 100 MG PO CAPS: 100.0000 mg | ORAL_CAPSULE | Freq: Every day | ORAL | Status: DC | PRN

## 2018-05-04 MED ORDER — ACETAMINOPHEN 325 MG RE SUPP: 325.0000 mg | RECTAL | Status: DC | PRN

## 2018-05-04 MED ORDER — ESMOLOL HCL 100 MG/10ML IV SOLN
INTRAVENOUS | Status: DC | PRN
  Administered 2018-05-04: 20 mg via INTRAVENOUS
  Administered 2018-05-04: 50 mg via INTRAVENOUS
  Administered 2018-05-04: 30 mg via INTRAVENOUS

## 2018-05-04 MED ORDER — HEPARIN SODIUM (PORCINE) 5000 UNIT/ML IJ SOLN
5000.0000 [IU] | Freq: Three times a day (TID) | INTRAMUSCULAR | Status: DC
  Administered 2018-05-04 – 2018-05-05 (×2): 5000 [IU] via SUBCUTANEOUS
  Filled 2018-05-04 (×2): qty 1

## 2018-05-04 MED ORDER — SODIUM CHLORIDE 0.9 % IV SOLN: INTRAVENOUS | Status: DC

## 2018-05-04 MED ORDER — DOCUSATE SODIUM 100 MG PO CAPS: 100.0000 mg | ORAL_CAPSULE | Freq: Every day | ORAL | Status: DC

## 2018-05-04 MED ORDER — VANCOMYCIN HCL IN DEXTROSE 1-5 GM/200ML-% IV SOLN
INTRAVENOUS | Status: AC
  Filled 2018-05-04: qty 200

## 2018-05-04 MED ORDER — ACETAMINOPHEN 325 MG PO TABS
325.0000 mg | ORAL_TABLET | ORAL | Status: DC | PRN
  Administered 2018-05-04: 650 mg via ORAL
  Filled 2018-05-04: qty 2

## 2018-05-04 MED ORDER — DEXAMETHASONE SODIUM PHOSPHATE 10 MG/ML IJ SOLN
INTRAMUSCULAR | Status: DC | PRN
  Administered 2018-05-04: 4 mg via INTRAVENOUS

## 2018-05-04 MED ORDER — GLYCOPYRROLATE PF 0.2 MG/ML IJ SOSY
PREFILLED_SYRINGE | INTRAMUSCULAR | Status: AC
  Filled 2018-05-04: qty 2

## 2018-05-04 MED ORDER — SODIUM CHLORIDE 0.9 % IV SOLN
INTRAVENOUS | Status: DC | PRN
  Administered 2018-05-04: 30 ug/min via INTRAVENOUS

## 2018-05-04 MED ORDER — ONDANSETRON HCL 4 MG/2ML IJ SOLN
INTRAMUSCULAR | Status: DC | PRN
  Administered 2018-05-04: 4 mg via INTRAVENOUS

## 2018-05-04 MED ORDER — EZETIMIBE 10 MG PO TABS: 10.0000 mg | ORAL_TABLET | Freq: Every day | ORAL | Status: DC

## 2018-05-04 MED ORDER — VANCOMYCIN HCL IN DEXTROSE 1-5 GM/200ML-% IV SOLN
1000.0000 mg | INTRAVENOUS | Status: AC
  Administered 2018-05-04: 1000 mg via INTRAVENOUS

## 2018-05-04 MED ORDER — SPIRONOLACTONE 25 MG PO TABS
25.0000 mg | ORAL_TABLET | Freq: Every evening | ORAL | Status: DC
  Administered 2018-05-04: 25 mg via ORAL
  Filled 2018-05-04: qty 1

## 2018-05-04 MED ORDER — LACTATED RINGERS IV SOLN
INTRAVENOUS | Status: DC | PRN
  Administered 2018-05-04: 07:00:00 via INTRAVENOUS

## 2018-05-04 MED ORDER — ROCURONIUM BROMIDE 50 MG/5ML IV SOSY
PREFILLED_SYRINGE | INTRAVENOUS | Status: AC
  Filled 2018-05-04: qty 5

## 2018-05-04 MED ORDER — PROPOFOL 10 MG/ML IV BOLUS
INTRAVENOUS | Status: AC
  Filled 2018-05-04: qty 20

## 2018-05-04 MED ORDER — FENTANYL CITRATE (PF) 250 MCG/5ML IJ SOLN
INTRAMUSCULAR | Status: DC | PRN
  Administered 2018-05-04: 25 ug via INTRAVENOUS

## 2018-05-04 MED ORDER — LABETALOL HCL 5 MG/ML IV SOLN: 10.0000 mg | INTRAVENOUS | Status: DC | PRN

## 2018-05-04 MED ORDER — ONDANSETRON HCL 4 MG/2ML IJ SOLN: 4.0000 mg | Freq: Four times a day (QID) | INTRAMUSCULAR | Status: DC | PRN

## 2018-05-04 MED ORDER — SODIUM CHLORIDE 0.9 % IJ SOLN
INTRAVENOUS | Status: DC | PRN
  Administered 2018-05-04: 09:00:00 via INTRAMUSCULAR

## 2018-05-04 MED ORDER — CLOPIDOGREL BISULFATE 75 MG PO TABS: 75.0000 mg | ORAL_TABLET | Freq: Every day | ORAL | Status: DC

## 2018-05-04 MED ORDER — MAGNESIUM SULFATE 2 GM/50ML IV SOLN: 2.0000 g | Freq: Every day | INTRAVENOUS | Status: DC | PRN

## 2018-05-04 MED ORDER — LIDOCAINE 2% (20 MG/ML) 5 ML SYRINGE
INTRAMUSCULAR | Status: DC | PRN
  Administered 2018-05-04: 60 mg via INTRAVENOUS

## 2018-05-04 MED ORDER — IODIXANOL 320 MG/ML IV SOLN
INTRAVENOUS | Status: DC | PRN
  Administered 2018-05-04: 50 mL via INTRAVENOUS

## 2018-05-04 MED ORDER — CEFAZOLIN SODIUM-DEXTROSE 2-4 GM/100ML-% IV SOLN: 2.0000 g | Freq: Three times a day (TID) | INTRAVENOUS | Status: DC

## 2018-05-04 MED ORDER — LIDOCAINE HCL (PF) 1 % IJ SOLN
INTRAMUSCULAR | Status: AC
  Filled 2018-05-04: qty 30

## 2018-05-04 MED ORDER — PHENOL 1.4 % MT LIQD: 1.0000 | OROMUCOSAL | Status: DC | PRN

## 2018-05-04 SURGICAL SUPPLY — 70 items
ADH SKN CLS APL DERMABOND .7 (GAUZE/BANDAGES/DRESSINGS) ×1
ADH SKN CLS LQ APL DERMABOND (GAUZE/BANDAGES/DRESSINGS) ×1
BAG SNAP BAND KOVER 36X36 (MISCELLANEOUS) ×1 IMPLANT
BALLN VIATRAC 6X20X135 (BALLOONS) ×2
BALLOON VIATRAC 6X20X135 (BALLOONS) IMPLANT
CANISTER SUCT 3000ML PPV (MISCELLANEOUS) ×2 IMPLANT
CATH ACCU-VU SIZ PIG 5F 100CM (CATHETERS) ×2 IMPLANT
CATH ANGIO 5F BER2 100CM (CATHETERS) ×1 IMPLANT
CATH INFINITI VERT 5FR 125CM (CATHETERS) ×1 IMPLANT
CATH ROBINSON RED A/P 18FR (CATHETERS) ×2 IMPLANT
CATH STRAIGHT 5FR 65CM (CATHETERS) IMPLANT
CLIP VESOCCLUDE MED 24/CT (CLIP) ×2 IMPLANT
CLIP VESOCCLUDE SM WIDE 24/CT (CLIP) ×2 IMPLANT
COVER BACK TABLE 60X90IN (DRAPES) ×2 IMPLANT
COVER DOME SNAP 22 D (MISCELLANEOUS) ×1 IMPLANT
COVER MAYO STAND STRL (DRAPES) ×1 IMPLANT
COVER WAND RF STERILE (DRAPES) ×2 IMPLANT
CRADLE DONUT ADULT HEAD (MISCELLANEOUS) ×2 IMPLANT
DERMABOND ADHESIVE PROPEN (GAUZE/BANDAGES/DRESSINGS) ×1
DERMABOND ADVANCED (GAUZE/BANDAGES/DRESSINGS) ×1
DERMABOND ADVANCED .7 DNX12 (GAUZE/BANDAGES/DRESSINGS) ×1 IMPLANT
DERMABOND ADVANCED .7 DNX6 (GAUZE/BANDAGES/DRESSINGS) IMPLANT
DEVICE CLOSURE PERCLS PRGLD 6F (VASCULAR PRODUCTS) IMPLANT
DEVICE EMBOSHIELD NAV6 4.0-7.0 (FILTER) ×2 IMPLANT
DEVICE TORQUE 50000 (MISCELLANEOUS) IMPLANT
DRAIN CHANNEL 15F RND FF W/TCR (WOUND CARE) IMPLANT
DRSG TEGADERM 2-3/8X2-3/4 SM (GAUZE/BANDAGES/DRESSINGS) ×1 IMPLANT
ELECT REM PT RETURN 9FT ADLT (ELECTROSURGICAL) ×2
ELECTRODE REM PT RTRN 9FT ADLT (ELECTROSURGICAL) ×1 IMPLANT
EVACUATOR SILICONE 100CC (DRAIN) IMPLANT
GLOVE BIOGEL PI IND STRL 7.5 (GLOVE) ×1 IMPLANT
GLOVE BIOGEL PI INDICATOR 7.5 (GLOVE) ×1
GLOVE SURG SS PI 7.5 STRL IVOR (GLOVE) ×2 IMPLANT
GOWN STRL REUS W/ TWL LRG LVL3 (GOWN DISPOSABLE) ×2 IMPLANT
GOWN STRL REUS W/ TWL XL LVL3 (GOWN DISPOSABLE) ×1 IMPLANT
GOWN STRL REUS W/TWL LRG LVL3 (GOWN DISPOSABLE) ×4
GOWN STRL REUS W/TWL XL LVL3 (GOWN DISPOSABLE) ×2
GUIDEWIRE ANGLED .035X150CM (WIRE) IMPLANT
GUIDEWIRE WHOLEY HI TOR 145CM (WIRE) IMPLANT
KIT BASIN OR (CUSTOM PROCEDURE TRAY) ×2 IMPLANT
KIT ENCORE 26 ADVANTAGE (KITS) ×3 IMPLANT
KIT TURNOVER KIT B (KITS) ×2 IMPLANT
NEEDLE PERC 18GX7CM (NEEDLE) ×2 IMPLANT
NS IRRIG 1000ML POUR BTL (IV SOLUTION) ×4 IMPLANT
PACK CAROTID (CUSTOM PROCEDURE TRAY) ×2 IMPLANT
PAD ARMBOARD 7.5X6 YLW CONV (MISCELLANEOUS) ×4 IMPLANT
PERCLOSE PROGLIDE 6F (VASCULAR PRODUCTS) ×2
SET MICROPUNCTURE 5F STIFF (MISCELLANEOUS) ×1 IMPLANT
SHEATH AVANTI 11CM 5FR (SHEATH) IMPLANT
SHEATH PINNACLE 6F 10CM (SHEATH) ×1 IMPLANT
SHEATH PINNACLE R/O II 7F 4CM (SHEATH) IMPLANT
SHEATH SHUTTLE SELECT 6F (SHEATH) ×1 IMPLANT
SHUNT CAROTID BYPASS 10 (VASCULAR PRODUCTS) IMPLANT
SHUNT CAROTID BYPASS 12FRX15.5 (VASCULAR PRODUCTS) IMPLANT
SPONGE SURGIFOAM ABS GEL 100 (HEMOSTASIS) IMPLANT
STENT XACT CAR 10X30X136 (Permanent Stent) ×1 IMPLANT
STENT XACT CAR 9X30X136 (Permanent Stent) ×1 IMPLANT
SUT PROLENE 6 0 BV (SUTURE) ×2 IMPLANT
SUT VIC AB 3-0 SH 27 (SUTURE)
SUT VIC AB 3-0 SH 27X BRD (SUTURE) ×2 IMPLANT
SUT VICRYL 4-0 PS2 18IN ABS (SUTURE) ×2 IMPLANT
SYR 10ML LL (SYRINGE) ×3 IMPLANT
SYR MEDRAD MARK V 150ML (SYRINGE) ×1 IMPLANT
TOWEL GREEN STERILE (TOWEL DISPOSABLE) ×2 IMPLANT
TRAY FOLEY MTR SLVR 16FR STAT (SET/KITS/TRAYS/PACK) ×2 IMPLANT
TUBING HIGH PRESSURE 120CM (CONNECTOR) ×1 IMPLANT
WATER STERILE IRR 1000ML POUR (IV SOLUTION) ×2 IMPLANT
WIRE BENTSON .035X145CM (WIRE) ×1 IMPLANT
WIRE HI TORQ VERSACORE J 260CM (WIRE) ×2 IMPLANT
WIRE ROSEN 145CM (WIRE) IMPLANT

## 2018-05-04 NOTE — Progress Notes (Signed)
05/04/2018 1331 Pt transferred to 4E-07 from PACU via bed with staff. CHG bath given. Pt oriented to room and call bell, call bell within reach. TELE applied, CCMD notified.  Amanda Cockayne, RN

## 2018-05-04 NOTE — Progress Notes (Signed)
Humidified room air trach collar set up for patient.  Pt uses HME for normal daily humidity- provided a denture cup with patient name on it for storage so device will not become lost while patient is in the hospital.  Clear breath sounds and no need for suctioning at time of check.  Will cont to follow progress.

## 2018-05-04 NOTE — Anesthesia Postprocedure Evaluation (Signed)
Anesthesia Post Note  Patient: Kenneth [name]  Procedure(s) Performed: INSERTION OF RIGHT  CAROTID STENT using an ABBOT- XACT carotid stent system (Right Neck)     Patient location during evaluation: PACU Anesthesia Type: General Level of consciousness: awake and alert Pain management: pain level controlled Vital Signs Assessment: post-procedure vital signs reviewed and stable Respiratory status: spontaneous breathing, nonlabored ventilation, respiratory function stable and patient connected to nasal cannula oxygen Cardiovascular status: blood pressure returned to baseline and stable Postop Assessment: no apparent nausea or vomiting Anesthetic complications: no    Last Vitals:  Vitals:   05/04/18 1331 05/04/18 1600  BP: 132/66   Pulse: 69   Resp: 20   Temp: 36.7 C   SpO2: 95% 97%    Last Pain:  Vitals:   05/04/18 1331  TempSrc: Oral  PainSc: 0-No pain                 Effie Berkshire

## 2018-05-04 NOTE — Anesthesia Procedure Notes (Signed)
Arterial Line Insertion Start/End10/04/2018 6:30 AM, 05/04/2018 6:46 AM Performed by: Jearld Pies, CRNA, CRNA  Patient location: Pre-op. Preanesthetic checklist: patient identified, IV checked, site marked, risks and benefits discussed, surgical consent, monitors and equipment checked, pre-op evaluation, timeout performed and anesthesia consent Right, radial was placed Catheter size: 20 G Hand hygiene performed , maximum sterile barriers used  and Seldinger technique used Allen's test indicative of satisfactory collateral circulation Attempts: 1 Procedure performed without using ultrasound guided technique. Following insertion, dressing applied and Biopatch. Post procedure assessment: normal  Patient tolerated the procedure well with no immediate complications.

## 2018-05-04 NOTE — Anesthesia Preprocedure Evaluation (Addendum)
Anesthesia Evaluation  Patient identified by MRN, date of birth, ID band Patient awake    Reviewed: Allergy & Precautions, NPO status , Patient's Chart, lab work & pertinent test results  Airway Mallampati: II  TM Distance: >3 FB Neck ROM: Full    Dental  (+) Upper Dentures, Dental Advisory Given   Pulmonary former smoker,    breath sounds clear to auscultation       Cardiovascular hypertension, Pt. on medications and Pt. on home beta blockers + CAD, + CABG and + Peripheral Vascular Disease   Rhythm:Regular Rate:Normal     Neuro/Psych Anxiety Depression TIACVA    GI/Hepatic Neg liver ROS, GERD  Medicated,  Endo/Other  negative endocrine ROS  Renal/GU Renal disease     Musculoskeletal  (+) Arthritis ,   Abdominal Normal abdominal exam  (+)   Peds  Hematology negative hematology ROS (+)   Anesthesia Other Findings   Reproductive/Obstetrics                            Lab Results  Component Value Date   WBC 10.7 (H) 04/26/2018   HGB 13.4 04/26/2018   HCT 41.8 04/26/2018   MCV 97.9 04/26/2018   PLT 284 04/26/2018   Lab Results  Component Value Date   CREATININE 1.31 (H) 04/26/2018   BUN 17 04/26/2018   NA 135 04/26/2018   K 4.6 04/26/2018   CL 102 04/26/2018   CO2 24 04/26/2018   Lab Results  Component Value Date   INR 0.84 04/26/2018   INR 0.95 11/14/2017   INR 1.05 10/07/2016   Echo: Left ventricle: The cavity size was normal. Wall thickness was   normal. Systolic function was normal. The estimated ejection   fraction was in the range of 60% to 65%. Wall motion was normal;   there were no regional wall motion abnormalities. Left   ventricular diastolic function parameters were normal. - Aortic valve: Mildly calcified annulus. Trileaflet; mildly   thickened leaflets. Valve area (VTI): 3.01 cm^2. Valve area   (Vmax): 2.24 cm^2. Valve area (Vmean): 2.3 cm^2. - Mitral valve:  Mildly calcified annulus. Mildly thickened leaflets   . - Left atrium: The atrium was mildly dilated.  Anesthesia Physical Anesthesia Plan  ASA: III  Anesthesia Plan: General   Post-op Pain Management:    Induction: Intravenous and Inhalational  PONV Risk Score and Plan: 3 and Ondansetron, Dexamethasone and Treatment may vary due to age or medical condition  Airway Management Planned: Tracheostomy  Additional Equipment: Arterial line  Intra-op Plan:   Post-operative Plan:   Informed Consent: I have reviewed the patients History and Physical, chart, labs and discussed the procedure including the risks, benefits and alternatives for the proposed anesthesia with the patient or authorized representative who has indicated his/her understanding and acceptance.     Plan Discussed with: CRNA  Anesthesia Plan Comments:        Anesthesia Quick Evaluation

## 2018-05-04 NOTE — Op Note (Signed)
Patient name: Kenneth [name] MRN: [id number] DOB: 30-Oct-1940 Sex: male  05/04/2018 Pre-operative Diagnosis: Symptomatic right carotid stenosis Post-operative diagnosis:  Same Surgeon:  Annamarie Major  Assistant: Servando Snare Procedure Performed:  1.  Ultrasound-guided access, right femoral artery  2.  Aortic arch angiogram  3.  Right carotid stent with distal embolic protection  4.  Closure device     Indications: The patient has a history of right carotid stenting in 2013 for symptomatic stenosis.  He has a history of laryngeal cancer and neck radiation.  He recently underwent tracheostomy.  He has developed new symptoms of facial numbness attributed to his right carotid stenosis which was detected on CT scan.  There is stenosis this was then the common carotid artery, below his previously placed stent.  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  He is here today for stenting.  Procedure:  The patient was identified in the holding area and taken to room 16.  The patient was then placed supine on the table and prepped and draped in the usual sterile fashion.  A time out was called.  Ultrasound was used to evaluate the right common femoral artery.  It was patent .  A digital ultrasound image was acquired.  A micropuncture needle was used to access the right common femoral artery under ultrasound guidance.  An 018 wire was advanced without resistance and a micropuncture sheath was placed.  The 018 wire was removed and a benson wire was placed.  The micropuncture sheath was exchanged for a 6 french sheath.  A pigtail catheter was advanced into the aortic arch and an aortic arch angiogram was performed.  Findings:   Aortogram: A type I aortic arch was identified.  No significant ostial great vessel stenosis was identified.  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  There was also approximately 60-70% stenosis proximal to the pseudoaneurysm within the common carotid artery.   Intervention: After the above images were acquired the decision was made to proceed with intervention.  A 6 French 90 cm sheath was advanced into the aortic arch.  The patient was then fully heparinized.  ACT levels were checked.  The ACT was greater than 250.  Next using a vertebral catheter the innominate artery was selected.  A versa core wire was advanced into the proximal common carotid artery.  The sheath was then advanced into the proximal common carotid artery.  Next, a large NAV 6 filter was inserted and deployed distal to the carotid stent that was previously placed.  I did not feel that predilation was necessary.  We did not have a stent long enough to treat this lesion with a single stent.  Therefore a 9 x 30 XACT followed by a 10 x 30 XACT were deployed in the common carotid artery with 1 cm overlap.  A 6 mm balloon was then used to mold the stents.  Completion angiography revealed resolution of the stenosis.  The filter was then retrieved.  There was no debris within the filter.  Intracranial images were then performed which will be separately interpreted by neuroradiology.  We were satisfied with these results.  The sheath was then removed and the arteriotomy was closed with a closure device.  The patient was successfully awakened from anesthesia and found to be neurologically intact.  He was taken to recovery  room in stable condition.  Impression:  #1  Successful stenting of a 60-70% common carotid artery and a common carotid pseudoaneurysm using overlapping 9 x 30 and 10 x 30 XACT stent with distal embolic protection    V. Annamarie Major, M.D. [facility] Office: [phone] Pager:  [phone]

## 2018-05-04 NOTE — H&P (Signed)
[facility] [place]  Patient name: Kenneth [name]      MRN: [id number]        DOB: 01-22-1941          Sex: male   REASON FOR VISIT:    carotid  HISOTRY OF PRESENT ILLNESS:    Kenneth [name] is a 77 y.o. male who is status post right carotid artery stenting on 06/13/2012 for symptomatic stenosis.  A 9 x 30 stent was placed.  He underwent stenting because of his history of laryngeal cancer, status post neck radiation.  The patient has had follow-up with Dr. Bridgett Larsson who had recommended retrograde right common carotid artery stenting, as the patient has had a new episode of left facial weakness, slurred speech, and left arm weakness.  The symptoms have now resolved except for some left facial sensory loss.  The procedure was delayed because the patient had developed severe laryngeal stenosis.  He has recently undergone tracheostomy placement.  He is on dual antiplatelet therapy.   PAST MEDICAL HISTORY:       Past Medical History:  Diagnosis Date  . Anxiety   . ASCVD (arteriosclerotic cardiovascular disease)   . Coronary artery disease    a. 2003: s/p CABG x3V  b. 2007: cath with patent bypass grafts.  c. 09/2016: [place] with cath showing patent bypass grafts, medical Rx recommended  . Depression   . GERD (gastroesophageal reflux disease)   . High triglycerides   . Hypertension   . Laryngeal carcinoma ([place])   . Nephrolithiasis   . Peripheral vascular disease ([place])   . Stroke ([place])   . Tobacco abuse   . Tracheal stenosis      FAMILY HISTORY:        Family History  Problem Relation Age of Onset  . Dementia Mother   . Heart disease Father   . Cancer Brother   . Heart disease Brother   . Hyperlipidemia Brother   . Cancer Brother   . Colon cancer Neg Hx     SOCIAL HISTORY:   Social History        Tobacco Use  . Smoking status: Former Smoker    Packs/day: 1.00    Years: 15.00     Pack years: 15.00    Types: Cigarettes    Start date: 04/22/1953    Last attempt to quit: 07/26/1970    Years since quitting: 47.7  . Smokeless tobacco: Former Systems developer    Types: Chew    Quit date: 05/05/2002  Substance Use Topics  . Alcohol use: No    Alcohol/week: 0.0 standard drinks     ALLERGIES:        Allergies  Allergen Reactions  . Nifedipine Other (See Comments)  . Erythromycin   . Lorazepam Other (See Comments)    REACTION: Alters mental status. "Turns into maniac" REACTION: Alters mental status. "Turns into maniac" REACTION: Alters mental status. "Turns into maniac"  . Other     REACTION: Unknown to patient. States that MD states allergy per medical records  . Penicillins Hives    Has patient had a PCN reaction causing immediate rash, facial/tongue/throat swelling, SOB or lightheadedness with hypotension: Yes Has patient had a PCN reaction causing severe rash involving mucus membranes or skin necrosis: No Has patient had a PCN reaction that required hospitalization No Has patient had a PCN reaction occurring within the last 10 years: No If all of the above answers are "NO", then may proceed  with Cephalosporin use.  Has patient had a PCN reaction causing immediate rash, facial/tongue/throat swelling, SOB or lightheadedness with hypotension: Yes Has patient had a PCN reaction causing severe rash involving mucus membranes or skin necrosis: No Has patient had a PCN reaction that required hospitalization No Has patient had a PCN reaction occurring within the last 10 years: No If all of the above answers are "NO", then may proceed with Cephalosporin use.  . Sulfacetamide     REACTION: Unknown to patient. States that MD states allergy per medical records  . Sulfonamide Derivatives Other (See Comments)    REACTION: Unknown to patient. States that MD states allergy per medical records  . Statins Itching and Rash     CURRENT MEDICATIONS:          Current Outpatient Medications  Medication Sig Dispense Refill  . aspirin 325 MG tablet Take 1 tablet (325 mg total) by mouth daily.    . clopidogrel (PLAVIX) 75 MG tablet Take 1 tablet (75 mg total) by mouth daily. 90 tablet 3  . docusate sodium (COLACE) 100 MG capsule Take 100 mg by mouth daily as needed for mild constipation.    . nitroGLYCERIN (NITROSTAT) 0.4 MG SL tablet Place 1 tablet (0.4 mg total) under the tongue every 5 (five) minutes as needed for chest pain. 25 tablet 3  . omeprazole (PRILOSEC) 20 MG capsule Take 20 mg every evening by mouth.     Marland Kitchen PARoxetine (PAXIL) 20 MG tablet Take 20 mg by mouth at bedtime.     Marland Kitchen spironolactone (ALDACTONE) 25 MG tablet Take 25 mg every evening by mouth.    Marland Kitchen amLODipine (NORVASC) 10 MG tablet Take 1 tablet (10 mg total) by mouth daily. 180 tablet 3  . ezetimibe (ZETIA) 10 MG tablet Take 1 tablet (10 mg total) by mouth daily. 90 tablet 3  . Omega-3 Fatty Acids (OMEGA-3 FISH OIL PO) Take 1 capsule by mouth daily.     No current facility-administered medications for this visit.     REVIEW OF SYSTEMS:   [X]  denotes positive finding, [ ]  denotes negative finding Cardiac  Comments:  Chest pain or chest pressure:    Shortness of breath upon exertion:    Short of breath when lying flat:    Irregular heart rhythm:        Vascular    Pain in calf, thigh, or hip brought on by ambulation:    Pain in feet at night that wakes you up from your sleep:     Blood clot in your veins:    Leg swelling:         Pulmonary    Oxygen at home:    Productive cough:     Wheezing:         Neurologic    Sudden weakness in arms or legs:     Sudden numbness in arms or legs:     Sudden onset of difficulty speaking or slurred speech:    Temporary loss of vision in one eye:     Problems with dizziness:         Gastrointestinal    Blood in stool:     Vomited blood:          Genitourinary    Burning when urinating:     Blood in urine:        Psychiatric    Major depression:         Hematologic    Bleeding problems:  Problems with blood clotting too easily:        Skin    Rashes or ulcers:        Constitutional    Fever or chills:      PHYSICAL EXAM:        Vitals:   04/03/18 0953 04/03/18 0956 04/03/18 0958  BP: 135/76 (!) 147/79 136/81  Pulse: (!) 54 (!) 53 (!) 53  Resp: 20    Temp: 98.6 F (37 C)    TempSrc: Oral    SpO2: 97%    Weight: 180 lb (81.6 kg)    Height: 5\' 6"  (1.676 m)      GENERAL: The patient is a well-nourished male, in no acute distress. The vital signs are documented above. CARDIAC: There is a regular rate and rhythm PULMONARY: Tracheostomy tube in place  ABDOMEN: Soft and non-tender with normal pitched bowel sounds.  MUSCULOSKELETAL: There are no major deformities or cyanosis. NEUROLOGIC: No focal weakness or paresthesias are detected. SKIN: There are no ulcers or rashes noted. PSYCHIATRIC: The patient has a normal affect.  STUDIES:   1. Bulky soft plaque in the Right CCA with estimated 65% stenosis at the level of the subglottic trachea. There is a 6 mm bulky soft plaque ulceration or less likely pseudoaneurysm projecting anteriorly from the mid Right CCA (series 9, image 87). Patent Right ICA stent with mild in stent plaque, no significant stenosis. Up to moderate Right ICA siphon plaque is partially visible. 2. Soft plaque in the Left CCA without stenosis. Soft and calcified plaque at the Left ICA origin with estimated 64% stenosis. Only mild atherosclerosis of the visible Left ICA siphon. 3. High-grade Radiographic String Sign stenosis of the Right Vertebral Artery V4 segment distal to the patent right PICA origin. 4. Left Vertebral artery V4 mild-to-moderate stenosis due to soft plaque proximal to the patent left PICA origin. 5. Stable CT  appearance of the neck soft tissues and upper chest since 2017.  MEDICAL ISSUES:   Symptomatic right carotid stenosis: The patient has a history of neck radiation and recent tracheostomy tube.  I do not think he is a candidate for retrograde right carotid stenting.  I think the most appropriate course of action is to proceed with a right femoral approach and intervention in his right common carotid artery stenosis.  This will need to be done in the operating room because of his recent tracheostomy tube.  I am going to have an anesthesia preoperative evaluation to determine whether or not this needs to be done under general anesthesia.  The patient is scheduled to go back to [facility] next week for follow-up of his tracheostomy.  He wants to have this done in early October.  I will continue his aspirin and Plavix.  The risks and benefits of the operation were discussed with the patient including the risk of stroke.  All of his questions were answered.    Annamarie Major, MD [facility] [phone] Pager [phone]   NO symptoms PE: CV:RRR PULM:CTA Neuro:  Baseline Plan right carotid stent   WB

## 2018-05-04 NOTE — Transfer of Care (Signed)
Immediate Anesthesia Transfer of Care Note  Patient: Kenneth [name]  Procedure(s) Performed: INSERTION OF RIGHT  CAROTID STENT using an ABBOT- XACT carotid stent system (Right Neck)  Patient Location: PACU  Anesthesia Type:General  Level of Consciousness: awake, alert  and oriented  Airway & Oxygen Therapy: Patient Spontanous Breathing and Patient connected to tracheostomy mask oxygen  Post-op Assessment: Report given to RN, Post -op Vital signs reviewed and stable, Patient moving all extremities, Patient moving all extremities X 4 and Patient able to stick tongue midline  Post vital signs: Reviewed and stable  Last Vitals:  Vitals Value Taken Time  BP 144/81 05/04/2018 10:07 AM  Temp    Pulse 57 05/04/2018 10:10 AM  Resp 21 05/04/2018 10:10 AM  SpO2 97 % 05/04/2018 10:10 AM  Vitals shown include unvalidated device data.  Last Pain:  Vitals:   05/04/18 0545  TempSrc: Oral         Complications: No apparent anesthesia complications

## 2018-05-04 NOTE — Anesthesia Procedure Notes (Signed)
Procedure Name: Intubation Date/Time: 05/04/2018 7:55 AM Performed by: Wilburn Cornelia, CRNA Pre-anesthesia Checklist: Patient identified, Emergency Drugs available, Suction available and Patient being monitored Patient Re-evaluated:Patient Re-evaluated prior to induction Oxygen Delivery Method: Circle system utilized Preoxygenation: Pre-oxygenation with 100% oxygen Induction Type: Tracheostomy Tube type: Reinforced Tube size: 5.5 mm Number of attempts: 1 Placement Confirmation: positive ETCO2,  breath sounds checked- equal and bilateral and CO2 detector Tube secured with: Tape Comments: 6.0 Shiley cuffed trach attempted to be passed through tracheostomy stoma, unable to pass due to resistance, 5.5 reinforced cuffed ETT passed through stoma with no resistance, +ETCO2,  Bilateral breath sounds, secured via left neck with tape.

## 2018-05-05 ENCOUNTER — Encounter (HOSPITAL_COMMUNITY): Payer: Self-pay | Admitting: Surgery

## 2018-05-05 ENCOUNTER — Telehealth: Payer: Self-pay | Admitting: Surgery

## 2018-05-05 LAB — CBC
HCT: 32.3 % — ABNORMAL LOW (ref 39.0–52.0)
Hemoglobin: 10.3 g/dL — ABNORMAL LOW (ref 13.0–17.0)
MCH: 30.1 pg (ref 26.0–34.0)
MCHC: 31.9 g/dL (ref 30.0–36.0)
MCV: 94.4 fL (ref 80.0–100.0)
Platelets: 273 10*3/uL (ref 150–400)
RBC: 3.42 MIL/uL — ABNORMAL LOW (ref 4.22–5.81)
RDW: 13 % (ref 11.5–15.5)
WBC: 19.8 10*3/uL — ABNORMAL HIGH (ref 4.0–10.5)
nRBC: 0 % (ref 0.0–0.2)

## 2018-05-05 LAB — BASIC METABOLIC PANEL
Anion gap: 11 (ref 5–15)
BUN: 20 mg/dL (ref 8–23)
CO2: 23 mmol/L (ref 22–32)
Calcium: 8.8 mg/dL — ABNORMAL LOW (ref 8.9–10.3)
Chloride: 101 mmol/L (ref 98–111)
Creatinine, Ser: 1.52 mg/dL — ABNORMAL HIGH (ref 0.61–1.24)
GFR calc Af Amer: 48 mL/min — ABNORMAL LOW (ref >60)
GFR calc non Af Amer: 42 mL/min — ABNORMAL LOW (ref >60)
Glucose, Bld: 160 mg/dL — ABNORMAL HIGH (ref 70–99)
Potassium: 4.7 mmol/L (ref 3.5–5.1)
Sodium: 135 mmol/L (ref 135–145)

## 2018-05-05 LAB — POCT ACTIVATED CLOTTING TIME: Activated Clotting Time: 241 seconds

## 2018-05-05 NOTE — Telephone Encounter (Signed)
Sch appt spk to pt mld ltr 05/29/18 11am carotid 1145am p/o MD

## 2018-05-05 NOTE — Progress Notes (Signed)
Kenneth [name] to be D/C'd Home per MD order. Discussed with the patient and all questions fully answered.    IV catheter discontinued intact. Site without signs and symptoms of complications. Dressing and pressure applied.  An After Visit Summary was printed and given to the patient.  Patient escorted via [place], and D/C home via private auto.  [name]  05/05/2018 10:48 AM

## 2018-05-05 NOTE — Progress Notes (Addendum)
[facility] [place]  Subjective  - doing well over all, wants to see ENT about trach discomfort.    Objective 138/64 69 98.2 F (36.8 C) (Oral) 18 99%  Intake/Output Summary (Last 24 hours) at 05/05/2018 0756 Last data filed at 05/05/2018 0606 Gross per 24 hour  Intake 1240 ml  Output 1930 ml  Net -690 ml    Right groin soft with ecchymosis, no hematoma Moving all 4 ext. Left foot warm well perfused with doppler signal DP Heart RRR Lungs non labored breathing via trach secondary to  laryngeal cancer.    Assessment/Planning: POD # 1 Right carotid stent using right femoral artery for access The patient expressed burning around his trach and requested ENT exam.  Dr. Constance Holster is ENT on call and will see him today prior to discharge.  F/U with Dr. Trula Slade in the office in 3-4 weeks with carotid duplex.    Roxy Horseman 05/05/2018 7:56 AM --  Laboratory Lab Results: Recent Labs    05/04/18 1421 05/05/18 0510  WBC 10.6* 19.8*  HGB 11.6* 10.3*  HCT 36.6* 32.3*  PLT 287 273   BMET Recent Labs    05/04/18 1421 05/05/18 0510  NA  --  135  K  --  4.7  CL  --  101  CO2  --  23  GLUCOSE  --  160*  BUN  --  20  CREATININE 1.46* 1.52*  CALCIUM  --  8.8*    COAG Lab Results  Component Value Date   INR 0.84 04/26/2018   INR 0.95 11/14/2017   INR 1.05 10/07/2016   No results found for: PTT  I have independently interviewed and examined patient and agree with PA assessment and plan above.   [place] Holian C. Donzetta Matters, MD [facility] [place] Office: [phone] Pager: [phone]

## 2018-05-05 NOTE — Progress Notes (Signed)
Central tele called to notified RN that pt had a 5 beat of SVT. Pt assessed and stable will continue to monitor. Charge RN aware

## 2018-05-05 NOTE — Consult Note (Signed)
  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  He is a patient of our practice.  He had radiation for laryngeal cancer about 20 years ago.  He then had a recurrence and underwent a partial laryngectomy.  He subsequently developed laryngeal stenosis and about 5 weeks ago had tracheostomy placed at [facility] by Dr. Rowe Clack.  He was scheduled to follow-up with Dr. Blenda Nicely here in [place] next month.  Tracheostomy has not been changed.  He has been doing very well with it.  On exam, he is lying supine, breathing very comfortably.  Tracheostomy is in place.  There is no bleeding.  He has a #6 uncuffed trach.  I changed very easily with a fresh new tracheostomy.  The stoma looks healthy.  There is no granulation tissue.  Stable, tracheostomy changed easily.  He will follow-up in our office with Dr. Blenda Nicely as scheduled.  Call if there is any additional concerns.

## 2018-05-05 NOTE — Discharge Instructions (Signed)
   [facility] [place]  Discharge Instructions   Carotid Endarterectomy (CEA)  Please refer to the following instructions for your post-procedure care. Your surgeon or physician assistant will discuss any changes with you.  Activity  You are encouraged to walk as much as you can. You can slowly return to normal activities but must avoid strenuous activity and heavy lifting until your doctor tell you it's OK. Avoid activities such as vacuuming or swinging a golf club. You can drive after one week if you are comfortable and you are no longer taking prescription pain medications. It is normal to feel tired for serval weeks after your surgery. It is also normal to have difficulty with sleep habits, eating, and bowel movements after surgery. These will go away with time.  Bathing/Showering  You may shower after you come home. Do not soak in a bathtub, hot tub, or swim until the incision heals completely.  Incision Care  Shower every day. Clean your incision with mild soap and water. Pat the area dry with a clean towel. You do not need a bandage unless otherwise instructed. Do not apply any ointments or creams to your incision. You may have skin glue on your incision. Do not peel it off. It will come off on its own in about one week. Your incision may feel thickened and raised for several weeks after your surgery. This is normal and the skin will soften over time. For Men Only: It's OK to shave around the incision but do not shave the incision itself for 2 weeks. It is common to have numbness under your chin that could last for several months.  Diet  Resume your normal diet. There are no special food restrictions following this procedure. A low fat/low cholesterol diet is recommended for all patients with vascular disease. In order to heal from your surgery, it is CRITICAL to get adequate nutrition. Your body requires vitamins, minerals, and protein. Vegetables are the best  source of vitamins and minerals. Vegetables also provide the perfect balance of protein. Processed food has little nutritional value, so try to avoid this.        Medications  Resume taking all of your medications unless your doctor or physician assistant tells you not to. If your incision is causing pain, you may take over-the- counter pain relievers such as acetaminophen (Tylenol). If you were prescribed a stronger pain medication, please be aware these medications can cause nausea and constipation. Prevent nausea by taking the medication with a snack or meal. Avoid constipation by drinking plenty of fluids and eating foods with a high amount of fiber, such as fruits, vegetables, and grains. Do not take Tylenol if you are taking prescription pain medications.  Follow Up  Our office will schedule a follow up appointment 2-3 weeks following discharge.  Please call us immediately for any of the following conditions  Increased pain, redness, drainage (pus) from your incision site. Fever of 101 degrees or higher. If you should develop stroke (slurred speech, difficulty swallowing, weakness on one side of your body, loss of vision) you should call 911 and go to the nearest emergency room.  Reduce your risk of vascular disease:  Stop smoking. If you would like help call [facility] at 1-800-QUIT-NOW (1-800-784-8669) or [place] at 336-586-4000. Manage your cholesterol Maintain a desired weight Control your diabetes Keep your blood pressure down  If you have any questions, please call the office at [phone].   

## 2018-05-05 NOTE — Discharge Summary (Signed)
[facility] Discharge Summary   Patient ID:  Kenneth [name] MRN: [id number] DOB/AGE: [birth year] 77 y.o.  Admit date: 05/04/2018 Discharge date: 05/05/2018 Date of Surgery: 05/04/2018 Surgeon: Surgeon(s): Serafina Mitchell, MD Waynetta Sandy, MD  Admission Diagnosis: RIGHT CAROTID STENOSIS  Discharge Diagnoses:  RIGHT CAROTID STENOSIS  Secondary Diagnoses: Past Medical History:  Diagnosis Date  . Anxiety   . 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 ASCVD (arteriosclerotic cardiovascular disease)   . Chronic lower back pain    "have it at night" (05/04/2018)  . Coronary artery disease    a. 2003: s/p CABG x3V  b. 2007: cath with patent bypass grafts.  c. 09/2016: [place] with cath showing patent bypass grafts, medical Rx recommended  . CVA (cerebral vascular accident) ([place]) 10/2017   "little numb on my left face since" (05/04/2018)  . Depression   . GERD (gastroesophageal reflux disease)   . High triglycerides   . History of kidney stones   . Hypertension   . 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 Malodorous urine    "in the last 5wks; since I had trach put in" (05/04/2018)  . Peripheral vascular disease ([place])   . Stroke [facility])    "he's had several little strokes; many that he wasn't aware of" (05/04/2018)  . Tobacco abuse   . Tracheal stenosis     Procedure(s): INSERTION OF RIGHT  CAROTID STENT using an ABBOT- XACT carotid stent system  Discharged Condition: good  HPI: Kenneth [name] a 77 y.o.malewho is status post right carotid artery stenting on 06/13/2012 for symptomatic stenosis. A 9 x 30 stent was placed. He underwent stenting because of his history of laryngeal cancer, status post neck radiation. The patient has had follow-up with Dr. Bridgett Larsson who had recommended retrograde right common carotid artery stenting, as the patient has had a new episode of left facial weakness, slurred speech, and left arm weakness. The symptoms have now resolved  except for some left facial sensory loss. The procedure was delayed because the patient had developed severe laryngeal stenosis. He has recently undergone tracheostomy placement. He is on dual antiplatelet therapy.   Hospital Course:  Kenneth [name] is a 77 y.o. male is S/P  Procedure(s): INSERTION OF RIGHT  CAROTID STENT using an ABBOT- XACT carotid stent system  Consults:  Treatment Team:  Waynetta Sandy, MD  ENT Dr. Constance Holster  Right groin soft without hematoma, moving all 4 ext.  He denise pain. Dr. Constance Holster placed new tracheostomy.  The stoma looks healthy.  There is no granulation tissue.  Stable, tracheostomy changed easily.  He will follow-up in our office with Dr. Blenda Nicely as scheduled.  Call if there is any additional concerns.  Patient stable for discharge home today f/u in 3-4 weeks with carotid duplex.    Significant Diagnostic Studies: CBC Lab Results  Component Value Date   WBC 19.8 (H) 05/05/2018   HGB 10.3 (L) 05/05/2018   HCT 32.3 (L) 05/05/2018   MCV 94.4 05/05/2018   PLT 273 05/05/2018    BMET    Component Value Date/Time   NA 135 05/05/2018 0510   K 4.7 05/05/2018 0510   CL 101 05/05/2018 0510   CO2 23 05/05/2018 0510   GLUCOSE 160 (H) 05/05/2018 0510   BUN 20 05/05/2018 0510   CREATININE 1.52 (H) 05/05/2018 0510   CALCIUM 8.8 (L) 05/05/2018 0510   GFRNONAA 42 (L) 05/05/2018 0510   GFRAA 48 (L) 05/05/2018 0510  COAG Lab Results  Component Value Date   INR 0.84 04/26/2018   INR 0.95 11/14/2017   INR 1.05 10/07/2016     Disposition:  Discharge to :Home Discharge Instructions    Call MD for:  redness, tenderness, or signs of infection (pain, swelling, bleeding, redness, odor or green/yellow discharge around incision site)   Complete by:  As directed    Call MD for:  severe or increased pain, loss or decreased feeling  in affected limb(s)   Complete by:  As directed    Call MD for:  temperature >100.5   Complete by:  As  directed    Resume previous diet   Complete by:  As directed      Allergies as of 05/05/2018      Reactions   Nifedipine Other (See Comments)   Lorazepam Other (See Comments)   REACTION: Alters mental status. "Turns into maniac" REACTION: Alters mental status. "Turns into maniac" REACTION: Alters mental status. "Turns into maniac"   Other    REACTION: Unknown to patient. States that MD states allergy per medical records   Penicillins Hives   Has patient had a PCN reaction causing immediate rash, facial/tongue/throat swelling, SOB or lightheadedness with hypotension: Yes Has patient had a PCN reaction causing severe rash involving mucus membranes or skin necrosis: No Has patient had a PCN reaction that required hospitalization No Has patient had a PCN reaction occurring within the last 10 years: No If all of the above answers are "NO", then may proceed with Cephalosporin use. Has patient had a PCN reaction causing immediate rash, facial/tongue/throat swelling, SOB or lightheadedness with hypotension: Yes Has patient had a PCN reaction causing severe rash involving mucus membranes or skin necrosis: No Has patient had a PCN reaction that required hospitalization No Has patient had a PCN reaction occurring within the last 10 years: No If all of the above answers are "NO", then may proceed with Cephalosporin use.   Sulfacetamide    REACTION: Unknown to patient. States that MD states allergy per medical records   Sulfonamide Derivatives Other (See Comments)   REACTION: Unknown to patient. States that MD states allergy per medical records   Erythromycin Rash   Statins Itching, Rash      Medication List    TAKE these medications   amLODipine 10 MG tablet Commonly known as:  NORVASC Take 1 tablet (10 mg total) by mouth daily.   aspirin 325 MG tablet Take 1 tablet (325 mg total) by mouth daily.   clopidogrel 75 MG tablet Commonly known as:  PLAVIX Take 1 tablet (75 mg total) by  mouth daily.   docusate sodium 100 MG capsule Commonly known as:  COLACE Take 100 mg by mouth daily as needed for mild constipation.   ezetimibe 10 MG tablet Commonly known as:  ZETIA Take 1 tablet (10 mg total) by mouth daily.   metoprolol tartrate 25 MG tablet Commonly known as:  LOPRESSOR Take 25 mg by mouth 2 (two) times daily.   nitroGLYCERIN 0.4 MG SL tablet Commonly known as:  NITROSTAT Place 1 tablet (0.4 mg total) under the tongue every 5 (five) minutes as needed for chest pain.   OMEGA-3 FISH OIL PO Take 1 capsule by mouth daily.   omeprazole 20 MG capsule Commonly known as:  PRILOSEC Take 20 mg every evening by mouth.   PARoxetine 20 MG tablet Commonly known as:  PAXIL Take 20 mg by mouth at bedtime.   spironolactone 25 MG tablet Commonly  known as:  ALDACTONE Take 25 mg every evening by mouth.      Verbal and written Discharge instructions given to the patient. Wound care per Discharge AVS Follow-up Information    Serafina Mitchell, MD Follow up in 3 week(s).   Specialties:  Vascular Surgery, Cardiology Why:  office will call Contact information: [address] [place] [address] [phone]           Signed: Roxy Horseman 05/05/2018, 11:21 AM

## 2018-05-09 DIAGNOSIS — Z8521 Personal history of malignant neoplasm of larynx: Secondary | ICD-10-CM | POA: Diagnosis not present

## 2018-05-09 DIAGNOSIS — J398 Other specified diseases of upper respiratory tract: Secondary | ICD-10-CM | POA: Diagnosis not present

## 2018-05-09 DIAGNOSIS — Z930 Tracheostomy status: Secondary | ICD-10-CM | POA: Diagnosis not present

## 2018-05-15 DIAGNOSIS — R05 Cough: Secondary | ICD-10-CM | POA: Diagnosis not present

## 2018-05-15 DIAGNOSIS — I6521 Occlusion and stenosis of right carotid artery: Secondary | ICD-10-CM | POA: Diagnosis not present

## 2018-05-16 ENCOUNTER — Other Ambulatory Visit: Payer: Self-pay

## 2018-05-16 DIAGNOSIS — I6521 Occlusion and stenosis of right carotid artery: Secondary | ICD-10-CM

## 2018-05-16 DIAGNOSIS — I6523 Occlusion and stenosis of bilateral carotid arteries: Secondary | ICD-10-CM

## 2018-05-24 DIAGNOSIS — H524 Presbyopia: Secondary | ICD-10-CM | POA: Diagnosis not present

## 2018-05-29 ENCOUNTER — Ambulatory Visit (INDEPENDENT_AMBULATORY_CARE_PROVIDER_SITE_OTHER): Payer: Self-pay | Admitting: Surgery

## 2018-05-29 ENCOUNTER — Encounter: Payer: Self-pay | Admitting: Surgery

## 2018-05-29 ENCOUNTER — Ambulatory Visit (HOSPITAL_COMMUNITY)
Admission: RE | Admit: 2018-05-29 | Discharge: 2018-05-29 | Disposition: A | Discharge status: Home / Self Care | Payer: PRIVATE HEALTH INSURANCE | Source: Ambulatory Visit | Attending: Surgery | Admitting: Surgery

## 2018-05-29 ENCOUNTER — Other Ambulatory Visit: Payer: Self-pay

## 2018-05-29 VITALS — BP 142/82 | HR 59 | Temp 97.8°F | Resp 20 | Ht 66.0 in | Wt 188.0 lb

## 2018-05-29 DIAGNOSIS — I6521 Occlusion and stenosis of right carotid artery: Secondary | ICD-10-CM

## 2018-05-29 DIAGNOSIS — I6523 Occlusion and stenosis of bilateral carotid arteries: Secondary | ICD-10-CM | POA: Diagnosis not present

## 2018-05-29 NOTE — Progress Notes (Signed)
Patient name: Kenneth [name] MRN: [id number] DOB: 1940/09/09 Sex: male  REASON FOR VISIT:     post op  HISTORY OF PRESENT ILLNESS:   Kenneth [name] is a 77 y.o. male who is status post right carotid artery stenting on 06/13/2012 for symptomatic stenosis.  A 9 x 30 stent was placed.  He underwent stenting because of his history of laryngeal cancer, status post neck radiation.  The patient has had follow-up with Dr. Bridgett Larsson who had recommended retrograde right common carotid artery stenting, as the patient has had a new episode of left facial weakness, slurred speech, and left arm weakness.  The symptoms have now resolved except for some left facial sensory loss.  The procedure was delayed because the patient had developed severe laryngeal stenosis.  He has recently undergone tracheostomy placement.  He is on dual antiplatelet therapy.   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  Overlapping 9 x 30 and 10 x 30 XACT stents were deployed.  He has had no neurologic deficits.  CURRENT MEDICATIONS:    Current Outpatient Medications  Medication Sig Dispense Refill  . aspirin 325 MG tablet Take 1 tablet (325 mg total) by mouth daily.    . clopidogrel (PLAVIX) 75 MG tablet Take 1 tablet (75 mg total) by mouth daily. 90 tablet 3  . docusate sodium (COLACE) 100 MG capsule Take 100 mg by mouth daily as needed for mild constipation.    . metoprolol tartrate (LOPRESSOR) 25 MG tablet Take 25 mg by mouth 2 (two) times daily.    . nitroGLYCERIN (NITROSTAT) 0.4 MG SL tablet Place 1 tablet (0.4 mg total) under the tongue every 5 (five) minutes as needed for chest pain. 25 tablet 3  . Omega-3 Fatty Acids (OMEGA-3 FISH OIL PO) Take 1 capsule by mouth daily.    Marland Kitchen omeprazole (PRILOSEC) 20 MG capsule Take 20 mg every evening by mouth.     Marland Kitchen PARoxetine (PAXIL) 20 MG  tablet Take 20 mg by mouth at bedtime.     Marland Kitchen spironolactone (ALDACTONE) 25 MG tablet Take 25 mg every evening by mouth.    Marland Kitchen amLODipine (NORVASC) 10 MG tablet Take 1 tablet (10 mg total) by mouth daily. 180 tablet 3  . ezetimibe (ZETIA) 10 MG tablet Take 1 tablet (10 mg total) by mouth daily. 90 tablet 3   No current facility-administered medications for this visit.     REVIEW OF SYSTEMS:   [X]  denotes positive finding, [ ]  denotes negative finding Cardiac  Comments:  Chest pain or chest pressure:    Shortness of breath upon exertion:    Short of breath when lying flat:    Irregular heart rhythm:    Constitutional    Fever or chills:      PHYSICAL EXAM:   Vitals:   05/29/18 1153 05/29/18 1157  BP: (!) 159/88 (!) 142/82  Pulse: (!) 59   Resp: 20   Temp: 97.8 F (36.6 C)   TempSrc: Oral   SpO2: 96%   Weight: 188 lb (85.3 kg)   Height: 5\' 6"  (1.676 m)     GENERAL: The patient is a well-nourished male, in no acute distress. The vital signs are documented above. CARDIOVASCULAR: There is a regular rate and rhythm. PULMONARY: Non-labored respirations Neurologically intact  STUDIES:   Widely patent right carotid arterial system and 40-59% left-sided stenosis on ultrasound   MEDICAL ISSUES:  Follow-up 6 months with repeat carotid duplex  Annamarie Major, MD [facility] [phone] Pager [phone]

## 2018-05-29 NOTE — Progress Notes (Signed)
Patient name: Kenneth [name] MRN: [id number] DOB: 06-05-1941 Sex: male  REASON FOR VISIT:     post op  HISTORY OF PRESENT ILLNESS:   Kenneth [name] is a 77 y.o. male who is status post right carotid artery stenting on 06/13/2012 for symptomatic stenosis.  A 9 x 30 stent was placed.  He underwent stenting because of his history of laryngeal cancer, status post neck radiation.  The patient has had follow-up with Dr. Bridgett Larsson who had recommended retrograde right common carotid artery stenting, as the patient has had a new episode of left facial weakness, slurred speech, and left arm weakness.  The symptoms have now resolved except for some left facial sensory loss.  The procedure was delayed because the patient had developed severe laryngeal stenosis.  He has recently undergone tracheostomy placement.  He is on dual antiplatelet therapy.  On 05/04/2018, the patient underwent stenting of a 60-70% common carotid artery stenosis and pseudoaneurysm with overlapping 9 x 30 and 10 x 30 XACT stents.  He was having some bleeding around his tracheostomy site and was seen by ENT.  This has improved somewhat.  He is neurologically intact   CURRENT MEDICATIONS:    Current Outpatient Medications  Medication Sig Dispense Refill  . aspirin 325 MG tablet Take 1 tablet (325 mg total) by mouth daily.    . clopidogrel (PLAVIX) 75 MG tablet Take 1 tablet (75 mg total) by mouth daily. 90 tablet 3  . docusate sodium (COLACE) 100 MG capsule Take 100 mg by mouth daily as needed for mild constipation.    . metoprolol tartrate (LOPRESSOR) 25 MG tablet Take 25 mg by mouth 2 (two) times daily.    . nitroGLYCERIN (NITROSTAT) 0.4 MG SL tablet Place 1 tablet (0.4 mg total) under the tongue every 5 (five) minutes as needed for chest pain. 25 tablet 3  . Omega-3 Fatty Acids (OMEGA-3 FISH OIL PO) Take 1 capsule by mouth daily.    Marland Kitchen omeprazole (PRILOSEC) 20 MG capsule Take 20 mg every evening  by mouth.     Marland Kitchen PARoxetine (PAXIL) 20 MG tablet Take 20 mg by mouth at bedtime.     Marland Kitchen spironolactone (ALDACTONE) 25 MG tablet Take 25 mg every evening by mouth.    Marland Kitchen amLODipine (NORVASC) 10 MG tablet Take 1 tablet (10 mg total) by mouth daily. 180 tablet 3  . ezetimibe (ZETIA) 10 MG tablet Take 1 tablet (10 mg total) by mouth daily. 90 tablet 3   No current facility-administered medications for this visit.     REVIEW OF SYSTEMS:   [X]  denotes positive finding, [ ]  denotes negative finding Cardiac  Comments:  Chest pain or chest pressure:    Shortness of breath upon exertion:    Short of breath when lying flat:    Irregular heart rhythm:    Constitutional    Fever or chills:      PHYSICAL EXAM:   Vitals:   05/29/18 1153 05/29/18 1157  BP: (!) 159/88 (!) 142/82  Pulse: (!) 59   Resp: 20   Temp: 97.8 F (36.6 C)   TempSrc: Oral   SpO2: 96%   Weight: 188 lb (85.3 kg)   Height: 5\' 6"  (1.676 m)     GENERAL: The patient is a well-nourished male, in no acute distress. The vital signs are documented above. CARDIOVASCULAR: There is a regular rate and rhythm. PULMONARY: Non-labored respirations Neurologically intact  STUDIES:   Carotid duplex shows widely patent right carotid  arterial system and 40-59% left-sided stenosis   MEDICAL ISSUES:   Status post carotid stenting.  He will follow-up in 6 months with repeat imaging.  He will continue with dual antiplatelet therapy.  Annamarie Major, MD [facility] [phone] Pager [phone]

## 2018-06-08 ENCOUNTER — Other Ambulatory Visit (HOSPITAL_COMMUNITY)
Admission: RE | Admit: 2018-06-08 | Discharge: 2018-06-08 | Disposition: A | Discharge status: Home / Self Care | Payer: PRIVATE HEALTH INSURANCE | Source: Ambulatory Visit | Attending: Student | Admitting: Student

## 2018-06-08 ENCOUNTER — Ambulatory Visit: Payer: Medicare HMO | Admitting: Student

## 2018-06-08 ENCOUNTER — Encounter: Payer: Self-pay | Admitting: Student

## 2018-06-08 VITALS — BP 142/78 | HR 63 | Ht 66.0 in | Wt 194.0 lb

## 2018-06-08 DIAGNOSIS — I10 Essential (primary) hypertension: Secondary | ICD-10-CM

## 2018-06-08 DIAGNOSIS — E782 Mixed hyperlipidemia: Secondary | ICD-10-CM | POA: Diagnosis not present

## 2018-06-08 DIAGNOSIS — I6523 Occlusion and stenosis of bilateral carotid arteries: Secondary | ICD-10-CM

## 2018-06-08 DIAGNOSIS — R0602 Shortness of breath: Secondary | ICD-10-CM | POA: Diagnosis not present

## 2018-06-08 DIAGNOSIS — J398 Other specified diseases of upper respiratory tract: Secondary | ICD-10-CM

## 2018-06-08 DIAGNOSIS — I2510 Atherosclerotic heart disease of native coronary artery without angina pectoris: Secondary | ICD-10-CM

## 2018-06-08 LAB — CBC WITH DIFFERENTIAL/PLATELET
Abs Immature Granulocytes: 0.06 10*3/uL (ref 0.00–0.07)
Basophils Absolute: 0 10*3/uL (ref 0.0–0.1)
Basophils Relative: 0 %
Eosinophils Absolute: 0.1 10*3/uL (ref 0.0–0.5)
Eosinophils Relative: 1 %
HCT: 39.4 % (ref 39.0–52.0)
Hemoglobin: 12.5 g/dL — ABNORMAL LOW (ref 13.0–17.0)
Immature Granulocytes: 1 %
Lymphocytes Relative: 23 %
Lymphs Abs: 2.5 10*3/uL (ref 0.7–4.0)
MCH: 30.4 pg (ref 26.0–34.0)
MCHC: 31.7 g/dL (ref 30.0–36.0)
MCV: 95.9 fL (ref 80.0–100.0)
Monocytes Absolute: 0.9 10*3/uL (ref 0.1–1.0)
Monocytes Relative: 9 %
Neutro Abs: 6.9 10*3/uL (ref 1.7–7.7)
Neutrophils Relative %: 66 %
Platelets: 334 10*3/uL (ref 150–400)
RBC: 4.11 MIL/uL — ABNORMAL LOW (ref 4.22–5.81)
RDW: 13.4 % (ref 11.5–15.5)
WBC: 10.5 10*3/uL (ref 4.0–10.5)
nRBC: 0 % (ref 0.0–0.2)

## 2018-06-08 LAB — BRAIN NATRIURETIC PEPTIDE: B Natriuretic Peptide: 93 pg/mL (ref 0.0–100.0)

## 2018-06-08 NOTE — Patient Instructions (Signed)
Medication Instructions:  Your physician recommends that you continue on your current medications as directed. Please refer to the Current Medication list given to you today.  If you need a refill on your cardiac medications before your next appointment, please call your pharmacy.   Lab work: Your physician recommends that you return for lab work in: Today   If you have labs (blood work) drawn today and your tests are completely normal, you will receive your results only by: Marland Kitchen MyChart Message (if you have MyChart) OR . A paper copy in the mail If you have any lab test that is abnormal or we need to change your treatment, we will call you to review the results.  Testing/Procedures: NONE   Follow-Up: At [facility], you and your health needs are our priority.  As part of our continuing mission to provide you with exceptional heart care, we have created designated Provider Care Teams.  These Care Teams include your primary Cardiologist (physician) and Advanced Practice Providers (APPs -  Physician Assistants and Nurse Practitioners) who all work together to provide you with the care you need, when you need it. You will need a follow up appointment in 3-4 months.  Please call our office 2 months in advance to schedule this appointment.  You may see Carlyle Dolly, MD or one of the following Advanced Practice Providers on your designated Care Team:   Bernerd Pho, PA-C [facility]) . Ermalinda Barrios, PA-C ([place])  Any Other Special Instructions Will Be Listed Below (If Applicable). Thank you for choosing [facility]!

## 2018-06-08 NOTE — Progress Notes (Signed)
Cardiology Office Note    Date:  06/08/2018   ID:  SEABORN NAKAMA, DOB Feb 28, 1941, MRN [id number]  PCP:  [name], MD  Cardiologist: Carlyle Dolly, MD    Chief Complaint  Patient presents with  . Follow-up    3 month visit    History of Present Illness:    Kenneth [name] is a 77 y.o. male with past medical history of CAD (s/p CABG in 2003, patent grafts by cath in 2007, NST in 11/2013 showing no ischemia), HTN, HLD, carotid artery stenosis (s/p stenting of right carotid in 2013), laryngeal stenosis (s/p tracheostomy placement in 02/2018) and prior CVA who presents to the office today for 75-month follow-up.  He was last examined by Dr. [name] in 12/2017 and reported having worsening shortness of breath over the past 6 months with occasional chest discomfort. He reported his episodes of pain would last for 1 to 2 hours and there was no association with exertion. A repeat Lexiscan Myoview was recommended for ischemic evaluation. This was performed on 12/30/2017 and showed no significant perfusion defects and was overall a low risk study. Lopressor was also discontinued at the time of his visit given bradycardia.   In the interim, he was admitted to [facility] in 04/2018 for planned stenting of the right carotid artery which was performed by Dr. Trula Slade. He tolerated this well and no immediate complications were noted.  He did have some bleeding around his tracheostomy site which was followed by ENT.  In talking with the patient today, he reports having chronic dyspnea ever since undergoing tracheostomy placement. He was previously having episodic dyspnea with just bending down to pick up objects and said this did improve with tracheostomy placement but he is still having a [place] for him as he was previously very active at baseline.  He has been sleeping in a recliner due to having frequent phlegm in his tracheostomy and also  notes frequent oozing at the site. He has plans to follow-up with ENT next week. Since sleeping in the recliner, he has noted upper back pain which lasts for hours in the day but is relieved with Tylenol. He denies any recent chest pain, palpitations, orthopnea, or PND.  He does have intermittent lower extremity edema. No recent melena, hematochezia, or hematuria.   Past Medical History:  Diagnosis Date  . Anxiety   . 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 ASCVD (arteriosclerotic cardiovascular disease)   . Chronic lower back pain    "have it at night" (05/04/2018)  . Coronary artery disease    a. 2003: s/p CABG x3V  b. 2007: cath with patent bypass grafts.  c. 09/2016: [place] with cath showing patent bypass grafts, medical Rx recommended  . CVA (cerebral vascular accident) ([place]) 10/2017   "little numb on my left face since" (05/04/2018)  . Depression   . GERD (gastroesophageal reflux disease)   . High triglycerides   . History of kidney stones   . Hypertension   . 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 Malodorous urine    "in the last 5wks; since I had trach put in" (05/04/2018)  . Peripheral vascular disease ([place])   . Stroke [facility])    "he's had several little strokes; many that he wasn't aware of" (05/04/2018)  . Tobacco abuse   . Tracheal stenosis     Past Surgical History:  Procedure Laterality Date  . CAROTID STENT Right 06-13-12;  05/04/2018  . CAROTID STENT INSERTION N/A 06/13/2012   Procedure: CAROTID STENT INSERTION;  Surgeon: Serafina Mitchell, MD;  Location: [facility] CATH LAB;  Service: Cardiovascular;  Laterality: N/A;  . CATARACT EXTRACTION, BILATERAL Bilateral   . COLONOSCOPY  11/10/2011   Dr. [name]: hemorrhoids, tubular adenoma  . CORNEAL TRANSPLANT Bilateral   . CORONARY ARTERY BYPASS GRAFT  ~ 2003   "CABG X3"  . EYE SURGERY    . INSERTION OF RETROGRADE CAROTID STENT Right 05/04/2018   Procedure: INSERTION OF RIGHT  CAROTID STENT using an ABBOT- XACT carotid stent system;  Surgeon:  Serafina Mitchell, MD;  Location: [place];  Service: Vascular;  Laterality: Right;  . LAPAROSCOPIC CHOLECYSTECTOMY  2007  . LEFT HEART CATH AND CORS/GRAFTS ANGIOGRAPHY N/A 10/07/2016   Procedure: Left Heart Cath and Cors/Grafts Angiography;  Surgeon: Troy Sine, MD;  Location: [facility] CV LAB;  Service: Cardiovascular;  Laterality: N/A;  . PARTIAL LARYNGECTOMY  1998  . TRACHEOSTOMY  03/13/2018   "@ [facility]"    Current Medications: Outpatient Medications Prior to Visit  Medication Sig Dispense Refill  . amLODipine (NORVASC) 10 MG tablet Take 1 tablet (10 mg total) by mouth daily. 180 tablet 3  . aspirin 325 MG tablet Take 1 tablet (325 mg total) by mouth daily.    . clopidogrel (PLAVIX) 75 MG tablet Take 1 tablet (75 mg total) by mouth daily. 90 tablet 3  . docusate sodium (COLACE) 100 MG capsule Take 100 mg by mouth daily as needed for mild constipation.    Marland Kitchen ezetimibe (ZETIA) 10 MG tablet Take 1 tablet (10 mg total) by mouth daily. 90 tablet 3  . metoprolol tartrate (LOPRESSOR) 25 MG tablet Take 25 mg by mouth 2 (two) times daily.    . nitroGLYCERIN (NITROSTAT) 0.4 MG SL tablet Place 1 tablet (0.4 mg total) under the tongue every 5 (five) minutes as needed for chest pain. 25 tablet 3  . Omega-3 Fatty Acids (OMEGA-3 FISH OIL PO) Take 1 capsule by mouth daily.    Marland Kitchen omeprazole (PRILOSEC) 20 MG capsule Take 20 mg every evening by mouth.     Marland Kitchen PARoxetine (PAXIL) 20 MG tablet Take 20 mg by mouth at bedtime.     Marland Kitchen spironolactone (ALDACTONE) 25 MG tablet Take 25 mg every evening by mouth.     No facility-administered medications prior to visit.      Allergies:   Nifedipine; Lorazepam; Other; Penicillins; Sulfacetamide; Sulfonamide derivatives; Erythromycin; and Statins   Social History   Socioeconomic History  . Marital status: Married    Spouse name: Not on file  . Number of children: Not on file  . Years of education: Not on file  . Highest education level: Not on file    Occupational History  . Occupation: retired    Comment: Warden/ranger  Social Needs  . Financial resource strain: Not on file  . Food insecurity:    Worry: Not on file    Inability: Not on file  . Transportation needs:    Medical: Not on file    Non-medical: Not on file  Tobacco Use  . Smoking status: Former Smoker    Packs/day: 1.00    Years: 15.00    Pack years: 15.00    Types: Cigarettes    Start date: 04/22/1953    Last attempt to quit: 07/26/1970    Years since quitting: 47.9  . Smokeless tobacco: Former Systems developer    Types: Chew    Quit date:  05/05/2002  Substance and Sexual Activity  . Alcohol use: Never    Alcohol/week: 0.0 standard drinks    Frequency: Never  . Drug use: Never  . Sexual activity: Not Currently  Lifestyle  . Physical activity:    Days per week: Not on file    Minutes per session: Not on file  . Stress: Not on file  Relationships  . Social connections:    Talks on phone: Not on file    Gets together: Not on file    Attends religious service: Not on file    Active member of club or organization: Not on file    Attends meetings of clubs or organizations: Not on file    Relationship status: Not on file  Other Topics Concern  . Not on file  Social History Narrative   Walks on the treadmill every other day at the [facility] for the last couple of weeks.           Family History:  The patient's family history includes Cancer in his brother and brother; Dementia in his mother; Heart disease in his brother and father; Hyperlipidemia in his brother.   Review of Systems:   Please see the history of present illness.     General:  No chills, fever, night sweats or weight changes.  Cardiovascular:  No chest pain, edema, orthopnea, palpitations, paroxysmal nocturnal dyspnea. Positive for dyspnea on exertion.  Dermatological: No rash, lesions/masses Respiratory: No cough, dyspnea Urologic: No hematuria, dysuria Abdominal:   No nausea, vomiting, diarrhea,  bright red blood per rectum, melena, or hematemesis Neurologic:  No visual changes, wkns, changes in mental status. All other systems reviewed and are otherwise negative except as noted above.   Physical Exam:    VS:  BP (!) 142/78   Pulse 63   Ht 5\' 6"  (1.676 m)   Wt 194 lb (88 kg)   SpO2 99%   BMI 31.31 kg/m    General: Well developed, well nourished Caucasian male appearing in no acute distress. Head: Normocephalic, atraumatic, sclera non-icteric, no xanthomas, nares are without discharge.  Neck: No carotid bruits. JVD not elevated. Trach in place.  Lungs: Respirations regular and unlabored, without wheezes or rales.  Heart: Regular rate and rhythm. No S3 or S4.  No murmur, no rubs, or gallops appreciated. Abdomen: Soft, non-tender, non-distended with normoactive bowel sounds. No hepatomegaly. No rebound/guarding. No obvious abdominal masses. Msk:  Strength and tone appear normal for age. No joint deformities or effusions. Extremities: No clubbing or cyanosis. Trace lower extremity edema.  Distal pedal pulses are 2+ bilaterally. Neuro: Alert and oriented X 3. Moves all extremities spontaneously. No focal deficits noted. Psych:  Responds to questions appropriately with a normal affect. Skin: No rashes or lesions noted  Wt Readings from Last 3 Encounters:  06/08/18 194 lb (88 kg)  05/29/18 188 lb (85.3 kg)  04/26/18 186 lb 8 oz (84.6 kg)    Studies/Labs Reviewed:   EKG:  EKG is not ordered today.   Recent Labs: 11/14/2017: TSH 3.910 04/26/2018: ALT 24 05/05/2018: BUN 20; Creatinine, Ser 1.52; Potassium 4.7; Sodium 135 06/08/2018: B Natriuretic Peptide 93.0; Hemoglobin 12.5; Platelets 334   Lipid Panel    Component Value Date/Time   CHOL 185 11/15/2017 0714   TRIG 439 (H) 11/15/2017 0714   HDL 29 (L) 11/15/2017 0714   CHOLHDL 6.4 11/15/2017 0714   VLDL UNABLE TO CALCULATE IF TRIGLYCERIDE OVER 400 mg/dL 11/15/2017 0714   LDLCALC UNABLE TO CALCULATE IF  TRIGLYCERIDE OVER  400 mg/dL 11/15/2017 8841    Additional studies/ records that were reviewed today include:   NST: 12/2017  No diagnostic ST segment changes to indicate ischemia.  No significant myocardial perfusion defects to indicate scar or ischemia.  This is a low risk study.  Nuclear stress EF: 67%.  Echocardiogram: 10/2017 Study Conclusions  - Left ventricle: The cavity size was normal. Wall thickness was   normal. Systolic function was normal. The estimated ejection   fraction was in the range of 60% to 65%. Wall motion was normal;   there were no regional wall motion abnormalities. Left   ventricular diastolic function parameters were normal. - Aortic valve: Mildly calcified annulus. Trileaflet; mildly   thickened leaflets. Valve area (VTI): 3.01 cm^2. Valve area   (Vmax): 2.24 cm^2. Valve area (Vmean): 2.3 cm^2. - Mitral valve: Mildly calcified annulus. Mildly thickened leaflets   . - Left atrium: The atrium was mildly dilated.  Monitor: 11/2017  30 day event monitor  Min HR 44, Max HR 122, Avg HR 62. Min HR in early AM hours presumably while asleep  No symptoms reported  Available tracings show sinus rhythm, rare PACs and rare PVCs  No significant arrhythmias   Assessment:    1. Coronary artery disease involving native coronary artery of native heart without angina pectoris   2. Shortness of breath   3. Essential hypertension   4. Mixed hyperlipidemia   5. Carotid stenosis, bilateral   6. Tracheal stenosis      Plan:   In order of problems listed above:  1. CAD/ Dyspnea on Exertion - s/p CABG in 2003 with patent grafts by cath in 2007. NST in 11/2013 and recent repeat stress testing in 12/2017 showed no significant perfusion defects and was overall a low risk study. - he reports having dyspnea on exertion which had been occurring prior to trach placement and has improved but is still occurring. He denies any recent chest pain. Suspect his dyspnea is due to recent  laryngeal stenosis but will recheck CBC and BNP as he does have trace edema on examination and this has been present for months per his report. Hgb was also at 10.3 in 04/2018 and will recheck today. Would not pursue repeat ischemic testing at this time given his recent low-risk NST earlier this year.  - continue ASA (on 325mg  daily per Neurology), Plavix, BB, and Zetia.   2. HTN - BP initially elevated to 158/98, improved to 142/78 on recheck. Reports this has overall been well-controlled in the ambulatory setting and was encouraged to keep a record of his readings. Will continue current regimen for now.   3. HLD - followed by PCP. Goal LDL is < 70 with known CAD and prior CVA. He has been intolerant to multiple statins and remains on Zetia. Will request recent labs and if not at goal, would consider referral to the [facility] for initiation of PCSK-9 inhibitor therapy given his CAD, carotid stenosis, and prior CVA.   4. Carotid Artery Stenosis - s/p stenting of right carotid in 2013 and repeat stenting in 04/2018. Followed by Vascular Surgery.   5. History of Laryngeal Carcinoma/ Recent Laryngeal Stenosis - followed by [facility]. Recenly underwent tracheostomy placement due to laryngeal stenosis. He has noticed improvement in his dyspnea but has continued to experience oozing along the site. Will recheck CBC as outlined above. Encouraged to keep upcoming follow-up with ENT.     Medication Adjustments/Labs and Tests Ordered: Current medicines are  reviewed at length with the patient today.  Concerns regarding medicines are outlined above.  Medication changes, Labs and Tests ordered today are listed in the Patient Instructions below. Patient Instructions  Medication Instructions:  Your physician recommends that you continue on your current medications as directed. Please refer to the Current Medication list given to you today.  If you need a refill on your cardiac medications before your next  appointment, please call your pharmacy.   Lab work: Your physician recommends that you return for lab work in: Today   If you have labs (blood work) drawn today and your tests are completely normal, you will receive your results only by: Marland Kitchen MyChart Message (if you have MyChart) OR . A paper copy in the mail If you have any lab test that is abnormal or we need to change your treatment, we will call you to review the results.  Testing/Procedures: NONE   Follow-Up: At [facility], you and your health needs are our priority.  As part of our continuing mission to provide you with exceptional heart care, we have created designated Provider Care Teams.  These Care Teams include your primary Cardiologist (physician) and Advanced Practice Providers (APPs -  Physician Assistants and Nurse Practitioners) who all work together to provide you with the care you need, when you need it. You will need a follow up appointment in 3-4 months.  Please call our office 2 months in advance to schedule this appointment.  You may see Carlyle Dolly, MD or one of the following Advanced Practice Providers on your designated Care Team:   Bernerd Pho, PA-C [facility]) . Ermalinda Barrios, PA-C ([place])  Any Other Special Instructions Will Be Listed Below (If Applicable). Thank you for choosing [facility]!        Signed, Erma Heritage, PA-C  06/08/2018 7:36 PM    [address] [place], [place] [address] Phone: [phone]

## 2018-06-09 DIAGNOSIS — Z8521 Personal history of malignant neoplasm of larynx: Secondary | ICD-10-CM | POA: Diagnosis not present

## 2018-06-09 DIAGNOSIS — Z930 Tracheostomy status: Secondary | ICD-10-CM | POA: Diagnosis not present

## 2018-06-09 DIAGNOSIS — J398 Other specified diseases of upper respiratory tract: Secondary | ICD-10-CM | POA: Diagnosis not present

## 2018-06-10 ENCOUNTER — Emergency Department (HOSPITAL_COMMUNITY)
Admission: EM | Admit: 2018-06-10 | Discharge: 2018-06-10 | Disposition: A | Discharge status: Home / Self Care | Payer: Medicare HMO | Attending: Emergency Medicine | Admitting: Emergency Medicine

## 2018-06-10 ENCOUNTER — Encounter (HOSPITAL_COMMUNITY): Payer: Self-pay | Admitting: Emergency Medicine

## 2018-06-10 ENCOUNTER — Emergency Department (HOSPITAL_COMMUNITY): Payer: Medicare HMO

## 2018-06-10 DIAGNOSIS — J189 Pneumonia, unspecified organism: Secondary | ICD-10-CM

## 2018-06-10 DIAGNOSIS — J181 Lobar pneumonia, unspecified organism: Secondary | ICD-10-CM | POA: Diagnosis not present

## 2018-06-10 DIAGNOSIS — R0989 Other specified symptoms and signs involving the circulatory and respiratory systems: Secondary | ICD-10-CM | POA: Insufficient documentation

## 2018-06-10 DIAGNOSIS — Z951 Presence of aortocoronary bypass graft: Secondary | ICD-10-CM | POA: Insufficient documentation

## 2018-06-10 DIAGNOSIS — R600 Localized edema: Secondary | ICD-10-CM | POA: Diagnosis not present

## 2018-06-10 DIAGNOSIS — R05 Cough: Secondary | ICD-10-CM | POA: Diagnosis not present

## 2018-06-10 DIAGNOSIS — R059 Cough, unspecified: Secondary | ICD-10-CM

## 2018-06-10 DIAGNOSIS — I10 Essential (primary) hypertension: Secondary | ICD-10-CM | POA: Diagnosis not present

## 2018-06-10 DIAGNOSIS — I2510 Atherosclerotic heart disease of native coronary artery without angina pectoris: Secondary | ICD-10-CM | POA: Insufficient documentation

## 2018-06-10 DIAGNOSIS — Z87891 Personal history of nicotine dependence: Secondary | ICD-10-CM | POA: Insufficient documentation

## 2018-06-10 DIAGNOSIS — Z79899 Other long term (current) drug therapy: Secondary | ICD-10-CM | POA: Diagnosis not present

## 2018-06-10 LAB — CBC WITH DIFFERENTIAL/PLATELET
Abs Immature Granulocytes: 0.07 10*3/uL (ref 0.00–0.07)
Basophils Absolute: 0 10*3/uL (ref 0.0–0.1)
Basophils Relative: 0 %
Eosinophils Absolute: 0.2 10*3/uL (ref 0.0–0.5)
Eosinophils Relative: 1 %
HCT: 39.1 % (ref 39.0–52.0)
Hemoglobin: 11.5 g/dL — ABNORMAL LOW (ref 13.0–17.0)
Immature Granulocytes: 1 %
Lymphocytes Relative: 16 %
Lymphs Abs: 2.3 10*3/uL (ref 0.7–4.0)
MCH: 28.9 pg (ref 26.0–34.0)
MCHC: 29.4 g/dL — ABNORMAL LOW (ref 30.0–36.0)
MCV: 98.2 fL (ref 80.0–100.0)
Monocytes Absolute: 1.1 10*3/uL — ABNORMAL HIGH (ref 0.1–1.0)
Monocytes Relative: 8 %
Neutro Abs: 10.3 10*3/uL — ABNORMAL HIGH (ref 1.7–7.7)
Neutrophils Relative %: 74 %
Platelets: 326 10*3/uL (ref 150–400)
RBC: 3.98 MIL/uL — ABNORMAL LOW (ref 4.22–5.81)
RDW: 13.4 % (ref 11.5–15.5)
WBC: 13.9 10*3/uL — ABNORMAL HIGH (ref 4.0–10.5)
nRBC: 0 % (ref 0.0–0.2)

## 2018-06-10 LAB — I-STAT TROPONIN, ED: Troponin i, poc: 0.01 ng/mL (ref 0.00–0.08)

## 2018-06-10 LAB — COMPREHENSIVE METABOLIC PANEL
ALT: 14 U/L (ref 0–44)
AST: 15 U/L (ref 15–41)
Albumin: 3.4 g/dL — ABNORMAL LOW (ref 3.5–5.0)
Alkaline Phosphatase: 104 U/L (ref 38–126)
Anion gap: 9 (ref 5–15)
BUN: 19 mg/dL (ref 8–23)
CO2: 23 mmol/L (ref 22–32)
Calcium: 9 mg/dL (ref 8.9–10.3)
Chloride: 103 mmol/L (ref 98–111)
Creatinine, Ser: 1.4 mg/dL — ABNORMAL HIGH (ref 0.61–1.24)
GFR calc Af Amer: 54 mL/min — ABNORMAL LOW (ref >60)
GFR calc non Af Amer: 46 mL/min — ABNORMAL LOW (ref >60)
Glucose, Bld: 117 mg/dL — ABNORMAL HIGH (ref 70–99)
Potassium: 4.8 mmol/L (ref 3.5–5.1)
Sodium: 135 mmol/L (ref 135–145)
Total Bilirubin: 0.7 mg/dL (ref 0.3–1.2)
Total Protein: 6.7 g/dL (ref 6.5–8.1)

## 2018-06-10 LAB — D-DIMER, QUANTITATIVE: D-Dimer, Quant: 0.39 ug/mL-FEU (ref 0.00–0.50)

## 2018-06-10 LAB — BRAIN NATRIURETIC PEPTIDE: B Natriuretic Peptide: 205.8 pg/mL — ABNORMAL HIGH (ref 0.0–100.0)

## 2018-06-10 MED ORDER — LEVOFLOXACIN 500 MG PO TABS: 500.0000 mg | ORAL_TABLET | Freq: Every day | ORAL | 0 refills | Status: DC

## 2018-06-10 MED ORDER — LIDOCAINE VISCOUS HCL 2 % MT SOLN
15.0000 mL | Freq: Once | OROMUCOSAL | Status: AC
  Administered 2018-06-10: 15 mL via OROMUCOSAL
  Filled 2018-06-10: qty 15

## 2018-06-10 MED ORDER — LIDOCAINE HCL 2 % IJ SOLN: 5.0000 mL | Freq: Once | INTRAMUSCULAR | Status: DC

## 2018-06-10 MED ORDER — LEVOFLOXACIN 500 MG PO TABS
500.0000 mg | ORAL_TABLET | Freq: Once | ORAL | Status: AC
  Administered 2018-06-10: 500 mg via ORAL
  Filled 2018-06-10: qty 1

## 2018-06-10 NOTE — ED Notes (Signed)
ED Provider at bedside. 

## 2018-06-10 NOTE — ED Notes (Signed)
Patient verbalized understanding of discharge instructions and denies any further needs or questions at this time. VS stable. Patient ambulatory with steady gait. Assisted to vehicle in wheelchair.   

## 2018-06-10 NOTE — ED Notes (Signed)
Patient transported to X-ray 

## 2018-06-10 NOTE — Progress Notes (Signed)
RT note: Cleaned patient trach site and Iinner cannula. Applied dressing and suctioned patient thick old blood tinged secretions. Placed patient on room air humidity to help with secretion mobilization.

## 2018-06-10 NOTE — ED Notes (Signed)
Delay in lab draw,   Pt enroute to xray. 

## 2018-06-10 NOTE — ED Triage Notes (Signed)
Pt arrives with c/o bleeding from his trach. pts wife reports that patient suctioned himself tonight and got back a lot of blood, has been coughing a lot the past few days. Pt is on plavix. resp e/u, nad

## 2018-06-10 NOTE — ED Provider Notes (Signed)
[facility] EMERGENCY DEPARTMENT Provider Note  CSN: [id number] Arrival date & time: 06/10/18 0440  Chief Complaint(s) Tracheostomy Tube Change (bleeding)  HPI Kenneth [name] is a 77 y.o. 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  Patient denies any fevers or chills.  Denies any chest pain or shortness of breath.  Endorsing upper back pain attributed to coughing.  No nausea or vomiting.  No abdominal pain or diarrhea.  HPI  Past Medical History Past Medical History:  Diagnosis Date  . Anxiety   . 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 ASCVD (arteriosclerotic cardiovascular disease)   . Chronic lower back pain    "have it at night" (05/04/2018)  . Coronary artery disease    a. 2003: s/p CABG x3V  b. 2007: cath with patent bypass grafts.  c. 09/2016: [place] with cath showing patent bypass grafts, medical Rx recommended  . CVA (cerebral vascular accident) ([place]) 10/2017   "little numb on my left face since" (05/04/2018)  . Depression   . GERD (gastroesophageal reflux disease)   . High triglycerides   . History of kidney stones   . Hypertension   . 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 Malodorous urine    "in the last 5wks; since I had trach put in" (05/04/2018)  . Peripheral vascular disease ([place])   . Stroke [facility])    "he's had several little strokes; many that he wasn't aware of" (05/04/2018)  . Tobacco abuse   . Tracheal stenosis    Patient Active Problem List   Diagnosis Date Noted  . Carotid stenosis, symptomatic, with infarction ([place]) 05/04/2018  . Ischemic stroke ([place]) 11/15/2017  . TIA (transient ischemic attack) 11/14/2017  . Constipation 09/15/2017  . Chest pain 10/06/2016  . Essential hypertension 10/06/2016  . 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 Dyspnea 12/05/2015  . 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 Aftercare following surgery of the circulatory system, [place] 08/03/2013  . Occlusion and stenosis of carotid artery without mention of cerebral infarction 07/21/2012  . Preop cardiovascular exam 05/08/2012  . Carotid stenosis, bilateral 05/05/2012  . CAD in native artery 08/13/2011  . Malignant neoplasm of larynx ([place]) 12/14/2009  . HLD (hyperlipidemia) 12/14/2009  . CEREBROVASCULAR ACCIDENT 12/14/2009  . GASTROESOPHAGEAL REFLUX DISEASE 12/14/2009  . NEPHROLITHIASIS 12/14/2009   Home Medication(s) Prior to Admission medications   Medication Sig Start Date End Date Taking? Authorizing Provider  amLODipine (NORVASC) 10 MG tablet Take 1 tablet (10 mg total) by mouth daily. 08/26/17 06/10/26 Yes BranchAlphonse Guild, MD  aspirin 325 MG tablet Take 1 tablet (325 mg total) by mouth daily. 11/16/17  Yes Johnson, Clanford L, MD  clopidogrel (PLAVIX) 75 MG tablet Take 1 tablet (75 mg total) by mouth daily. 12/26/17  Yes BranchAlphonse Guild, MD  ezetimibe (ZETIA) 10 MG tablet Take 1 tablet (10 mg total) by mouth daily. 09/07/17 06/10/26 Yes BranchAlphonse Guild, MD  metoprolol tartrate (LOPRESSOR) 25 MG tablet Take 25 mg by mouth 2 (two) times daily. 03/16/17  Yes [provider]  nitroGLYCERIN (NITROSTAT) 0.4 MG SL tablet Place 1 tablet (0.4 mg total) under the tongue every 5 (five) minutes as needed for chest pain. 01/31/17 05/25/21 Yes Lendon Colonel, NP  Omega-3 Fatty Acids (OMEGA-3 FISH OIL PO) Take 1 capsule by mouth daily.  Yes [provider]  omeprazole (PRILOSEC) 20 MG capsule Take 20 mg every evening by mouth.    Yes [provider]  PARoxetine (PAXIL) 20 MG tablet Take 20 mg by mouth at bedtime.    Yes [provider]  spironolactone (ALDACTONE) 25 MG tablet Take 25 mg every evening by mouth.   Yes [provider]  levofloxacin (LEVAQUIN) 500 MG tablet Take 1 tablet (500 mg total) by mouth daily.  06/10/18   Fatima Blank, MD                                                                                                                                    Past Surgical History Past Surgical History:  Procedure Laterality Date  . CAROTID STENT Right 06-13-12; 05/04/2018  . CAROTID STENT INSERTION N/A 06/13/2012   Procedure: CAROTID STENT INSERTION;  Surgeon: Serafina Mitchell, MD;  Location: [facility] CATH LAB;  Service: Cardiovascular;  Laterality: N/A;  . CATARACT EXTRACTION, BILATERAL Bilateral   . COLONOSCOPY  11/10/2011   Dr. [name]: hemorrhoids, tubular adenoma  . CORNEAL TRANSPLANT Bilateral   . CORONARY ARTERY BYPASS GRAFT  ~ 2003   "CABG X3"  . EYE SURGERY    . INSERTION OF RETROGRADE CAROTID STENT Right 05/04/2018   Procedure: INSERTION OF RIGHT  CAROTID STENT using an ABBOT- XACT carotid stent system;  Surgeon: Serafina Mitchell, MD;  Location: [facility];  Service: Vascular;  Laterality: Right;  . LAPAROSCOPIC CHOLECYSTECTOMY  2007  . LEFT HEART CATH AND CORS/GRAFTS ANGIOGRAPHY N/A 10/07/2016   Procedure: Left Heart Cath and Cors/Grafts Angiography;  Surgeon: Troy Sine, MD;  Location: [facility] CV LAB;  Service: Cardiovascular;  Laterality: N/A;  . PARTIAL LARYNGECTOMY  1998  . TRACHEOSTOMY  03/13/2018   "@ [facility]"   Family History Family History  Problem Relation Age of Onset  . Dementia Mother   . Heart disease Father   . Cancer Brother   . Heart disease Brother   . Hyperlipidemia Brother   . Cancer Brother   . Colon cancer Neg Hx     Social History Social History   Tobacco Use  . Smoking status: Former Smoker    Packs/day: 1.00    Years: 15.00    Pack years: 15.00    Types: Cigarettes    Start date: 04/22/1953    Last attempt to quit: 07/26/1970    Years since quitting: 47.9  . Smokeless tobacco: Former Systems developer    Types: Chew    Quit date: 05/05/2002  Substance Use Topics  . Alcohol use: Never    Alcohol/week: 0.0 standard drinks    Frequency: Never    . Drug use: Never   Allergies Nifedipine; Lorazepam; Other; Penicillins; Sulfacetamide; Sulfonamide derivatives; Erythromycin; and Statins  Review of Systems Review of Systems All other systems are reviewed and are negative for acute change except as noted in the HPI  Physical Exam Vital Signs  I have reviewed the triage vital signs BP 132/66   Pulse (!) 56   Temp 98.8 F (37.1 C) (Oral)   Resp (!) 22   SpO2 97%   Physical Exam  Constitutional: He is oriented to person, place, and time. He appears well-developed and well-nourished. No distress.  HENT:  Head: Normocephalic and atraumatic.  Nose: Nose normal.  Eyes: Pupils are equal, round, and reactive to light. Conjunctivae and EOM are normal. Right eye exhibits no discharge. Left eye exhibits no discharge. No scleral icterus.  Neck: Normal range of motion. Neck supple.    Cardiovascular: Normal rate and regular rhythm. Exam reveals no gallop and no friction rub.  No murmur heard. Pulmonary/Chest: Effort normal. No stridor. No respiratory distress. He has rales in the left lower field.  Abdominal: Soft. He exhibits no distension. There is no tenderness.  Musculoskeletal: He exhibits no edema or tenderness.  Bilateral lower extremity edema left greater than right.  Neurological: He is alert and oriented to person, place, and time.  Skin: Skin is warm and dry. No rash noted. He is not diaphoretic. No erythema.  Psychiatric: He has a normal mood and affect.  Vitals reviewed.   ED Results and Treatments Labs (all labs ordered are listed, but only abnormal results are displayed) Labs Reviewed  COMPREHENSIVE METABOLIC PANEL - Abnormal; Notable for the following components:      Result Value   Glucose, Bld 117 (*)    Creatinine, Ser 1.40 (*)    Albumin 3.4 (*)    GFR calc non Af Amer 46 (*)    GFR calc Af Amer 54 (*)    All other components within normal limits  CBC WITH DIFFERENTIAL/PLATELET - Abnormal; Notable for the  following components:   WBC 13.9 (*)    RBC 3.98 (*)    Hemoglobin 11.5 (*)    MCHC 29.4 (*)    Neutro Abs 10.3 (*)    Monocytes Absolute 1.1 (*)    All other components within normal limits  BRAIN NATRIURETIC PEPTIDE - Abnormal; Notable for the following components:   B Natriuretic Peptide 205.8 (*)    All other components within normal limits  D-DIMER, QUANTITATIVE (NOT AT [facility])  I-STAT TROPONIN, ED                                                                                                                         EKG  EKG Interpretation  Date/Time:  Saturday June 10 2018 05:53:15 EST Ventricular Rate:  54 PR Interval:    QRS Duration: 119 QT Interval:  449 QTC Calculation: 426 R Axis:   31 Text Interpretation:  Sinus rhythm Incomplete right bundle branch block No significant change since last tracing Confirmed by Addison Lank [phone]) on 06/10/2018 6:08:55 AM      Radiology Dg Chest 2 View  Result Date: 06/10/2018 CLINICAL DATA:  Acute onset of bleeding from tracheostomy tube. Cough. Patient on Plavix. EXAM:  CHEST - 2 VIEW COMPARISON:  Chest radiograph performed 11/21/2017 FINDINGS: The patient's tracheostomy tube is seen ending 4 cm above the carina. The lungs are hypoexpanded. There is elevation of the right hemidiaphragm. Mild retrocardiac opacity could reflect atelectasis or mild pneumonia, depending on the patient's symptoms. This is not well characterized on the lateral view. There is no evidence of pleural effusion or pneumothorax. The heart is borderline enlarged. The patient is status post median sternotomy. No acute osseous abnormalities are seen. A vascular stent is noted overlying the right side of the neck. IMPRESSION: 1. Lungs hypoexpanded, with elevation of the right hemidiaphragm. Mild retrocardiac opacity could reflect atelectasis or mild pneumonia, depending on the patient's symptoms. 2. Borderline cardiomegaly. Electronically Signed   By: Garald Balding  M.D.   On: 06/10/2018 05:59   Pertinent labs & imaging results that were available during my care of the patient were reviewed by me and considered in my medical decision making (see chart for details).  Medications Ordered in ED Medications  levofloxacin (LEVAQUIN) tablet 500 mg (has no administration in time range)  lidocaine (XYLOCAINE) 2 % viscous mouth solution 15 mL (15 mLs Mouth/Throat Given 06/10/18 2536)                                                                                                                                    Procedures Procedures  (including critical care time)  Medical Decision Making / ED Course I have reviewed the nursing notes for this encounter and the patient's prior records (if available in EHR or on provided paperwork).    Patient presents with several days of cough and 1 day of hemoptysis.  Work-up notable for left lower lobe opacity concerning for community-acquired pneumonia.  Given history of cancer and asymmetric lower extremity edema, DVT/PE was considered.  D-dimer was negative making this highly unlikely.  He denies any chest pain concerning for cardiac etiologies.  She is currently afebrile with stable vital signs.  CBC with mild leukocytosis.  No significant electrolyte derangements.  Renal function at baseline.   Patient is not septic and appropriate for outpatient management.  The patient appears reasonably screened and/or stabilized for discharge and I doubt any other medical condition or other [facility] requiring further screening, evaluation, or treatment in the ED at this time prior to discharge.  The patient is safe for discharge with strict return precautions.   Final Clinical Impression(s) / ED Diagnoses Final diagnoses:  Cough  Community acquired pneumonia of left lower lobe of lung ([place])   Disposition: Discharge  Condition: Good  I have discussed the results, Dx and Tx plan with the patient who expressed  understanding and agree(s) with the plan. Discharge instructions discussed at great length. The patient was given strict return precautions who verbalized understanding of the instructions. No further questions at time of discharge.    ED Discharge Orders  Ordered    levofloxacin (LEVAQUIN) 500 MG tablet  Daily     06/10/18 0851           Follow Up: [name], MD [address] [place] [address] [phone]  Schedule an appointment as soon as possible for a visit  As needed  Otolaryngologist  On 06/14/2018 As scheduled     This chart was dictated using voice recognition software.  Despite best efforts to proofread,  errors can occur which can change the documentation meaning.   Fatima Blank, MD 06/10/18 [id number]

## 2018-06-14 DIAGNOSIS — J398 Other specified diseases of upper respiratory tract: Secondary | ICD-10-CM | POA: Diagnosis not present

## 2018-06-14 DIAGNOSIS — Z8521 Personal history of malignant neoplasm of larynx: Secondary | ICD-10-CM | POA: Diagnosis not present

## 2018-06-14 DIAGNOSIS — J386 Stenosis of larynx: Secondary | ICD-10-CM | POA: Diagnosis not present

## 2018-06-14 DIAGNOSIS — Z930 Tracheostomy status: Secondary | ICD-10-CM | POA: Diagnosis not present

## 2018-06-19 DIAGNOSIS — Z930 Tracheostomy status: Secondary | ICD-10-CM | POA: Diagnosis not present

## 2018-06-19 DIAGNOSIS — J398 Other specified diseases of upper respiratory tract: Secondary | ICD-10-CM | POA: Diagnosis not present

## 2018-06-19 DIAGNOSIS — Z8521 Personal history of malignant neoplasm of larynx: Secondary | ICD-10-CM | POA: Diagnosis not present

## 2018-06-20 DIAGNOSIS — Z8521 Personal history of malignant neoplasm of larynx: Secondary | ICD-10-CM | POA: Diagnosis not present

## 2018-06-20 DIAGNOSIS — J398 Other specified diseases of upper respiratory tract: Secondary | ICD-10-CM | POA: Diagnosis not present

## 2018-06-20 DIAGNOSIS — Z930 Tracheostomy status: Secondary | ICD-10-CM | POA: Diagnosis not present

## 2018-06-29 DIAGNOSIS — I10 Essential (primary) hypertension: Secondary | ICD-10-CM | POA: Diagnosis not present

## 2018-06-29 DIAGNOSIS — K219 Gastro-esophageal reflux disease without esophagitis: Secondary | ICD-10-CM | POA: Diagnosis not present

## 2018-06-29 DIAGNOSIS — R7303 Prediabetes: Secondary | ICD-10-CM | POA: Diagnosis not present

## 2018-06-29 DIAGNOSIS — I2510 Atherosclerotic heart disease of native coronary artery without angina pectoris: Secondary | ICD-10-CM | POA: Diagnosis not present

## 2018-06-29 DIAGNOSIS — Z79899 Other long term (current) drug therapy: Secondary | ICD-10-CM | POA: Diagnosis not present

## 2018-06-29 DIAGNOSIS — E785 Hyperlipidemia, unspecified: Secondary | ICD-10-CM | POA: Diagnosis not present

## 2018-07-07 DIAGNOSIS — Z0001 Encounter for general adult medical examination with abnormal findings: Secondary | ICD-10-CM | POA: Diagnosis not present

## 2018-07-07 DIAGNOSIS — Z6832 Body mass index (BMI) 32.0-32.9, adult: Secondary | ICD-10-CM | POA: Diagnosis not present

## 2018-07-07 DIAGNOSIS — I129 Hypertensive chronic kidney disease with stage 1 through stage 4 chronic kidney disease, or unspecified chronic kidney disease: Secondary | ICD-10-CM | POA: Diagnosis not present

## 2018-07-07 DIAGNOSIS — I4510 Unspecified right bundle-branch block: Secondary | ICD-10-CM | POA: Diagnosis not present

## 2018-07-07 DIAGNOSIS — N183 Chronic kidney disease, stage 3 (moderate): Secondary | ICD-10-CM | POA: Diagnosis not present

## 2018-07-07 DIAGNOSIS — I2510 Atherosclerotic heart disease of native coronary artery without angina pectoris: Secondary | ICD-10-CM | POA: Diagnosis not present

## 2018-07-07 DIAGNOSIS — I700 Atherosclerosis of aorta: Secondary | ICD-10-CM | POA: Diagnosis not present

## 2018-07-09 DIAGNOSIS — Z8521 Personal history of malignant neoplasm of larynx: Secondary | ICD-10-CM | POA: Diagnosis not present

## 2018-07-09 DIAGNOSIS — J398 Other specified diseases of upper respiratory tract: Secondary | ICD-10-CM | POA: Diagnosis not present

## 2018-07-09 DIAGNOSIS — Z930 Tracheostomy status: Secondary | ICD-10-CM | POA: Diagnosis not present

## 2018-07-11 DIAGNOSIS — Z430 Encounter for attention to tracheostomy: Secondary | ICD-10-CM | POA: Diagnosis not present

## 2018-07-11 DIAGNOSIS — Z8521 Personal history of malignant neoplasm of larynx: Secondary | ICD-10-CM | POA: Diagnosis not present

## 2018-07-11 DIAGNOSIS — J398 Other specified diseases of upper respiratory tract: Secondary | ICD-10-CM | POA: Diagnosis not present

## 2018-07-11 DIAGNOSIS — J3801 Paralysis of vocal cords and larynx, unilateral: Secondary | ICD-10-CM | POA: Diagnosis not present

## 2018-07-14 DIAGNOSIS — J398 Other specified diseases of upper respiratory tract: Secondary | ICD-10-CM | POA: Diagnosis not present

## 2018-07-14 DIAGNOSIS — Z8521 Personal history of malignant neoplasm of larynx: Secondary | ICD-10-CM | POA: Diagnosis not present

## 2018-07-14 DIAGNOSIS — Z930 Tracheostomy status: Secondary | ICD-10-CM | POA: Diagnosis not present

## 2018-07-20 DIAGNOSIS — Z8521 Personal history of malignant neoplasm of larynx: Secondary | ICD-10-CM | POA: Diagnosis not present

## 2018-07-20 DIAGNOSIS — J398 Other specified diseases of upper respiratory tract: Secondary | ICD-10-CM | POA: Diagnosis not present

## 2018-07-20 DIAGNOSIS — Z930 Tracheostomy status: Secondary | ICD-10-CM | POA: Diagnosis not present

## 2018-08-09 DIAGNOSIS — J398 Other specified diseases of upper respiratory tract: Secondary | ICD-10-CM | POA: Diagnosis not present

## 2018-08-09 DIAGNOSIS — Z930 Tracheostomy status: Secondary | ICD-10-CM | POA: Diagnosis not present

## 2018-08-09 DIAGNOSIS — Z8521 Personal history of malignant neoplasm of larynx: Secondary | ICD-10-CM | POA: Diagnosis not present

## 2018-08-20 DIAGNOSIS — Z930 Tracheostomy status: Secondary | ICD-10-CM | POA: Diagnosis not present

## 2018-08-20 DIAGNOSIS — Z8521 Personal history of malignant neoplasm of larynx: Secondary | ICD-10-CM | POA: Diagnosis not present

## 2018-08-20 DIAGNOSIS — J398 Other specified diseases of upper respiratory tract: Secondary | ICD-10-CM | POA: Diagnosis not present

## 2018-08-30 DIAGNOSIS — R05 Cough: Secondary | ICD-10-CM | POA: Diagnosis not present

## 2018-08-31 DIAGNOSIS — I129 Hypertensive chronic kidney disease with stage 1 through stage 4 chronic kidney disease, or unspecified chronic kidney disease: Secondary | ICD-10-CM | POA: Diagnosis not present

## 2018-08-31 DIAGNOSIS — G8929 Other chronic pain: Secondary | ICD-10-CM | POA: Diagnosis not present

## 2018-08-31 DIAGNOSIS — Z930 Tracheostomy status: Secondary | ICD-10-CM | POA: Diagnosis not present

## 2018-08-31 DIAGNOSIS — G629 Polyneuropathy, unspecified: Secondary | ICD-10-CM | POA: Diagnosis not present

## 2018-08-31 DIAGNOSIS — E785 Hyperlipidemia, unspecified: Secondary | ICD-10-CM | POA: Diagnosis not present

## 2018-08-31 DIAGNOSIS — E669 Obesity, unspecified: Secondary | ICD-10-CM | POA: Diagnosis not present

## 2018-08-31 DIAGNOSIS — K08409 Partial loss of teeth, unspecified cause, unspecified class: Secondary | ICD-10-CM | POA: Diagnosis not present

## 2018-08-31 DIAGNOSIS — I2510 Atherosclerotic heart disease of native coronary artery without angina pectoris: Secondary | ICD-10-CM | POA: Diagnosis not present

## 2018-08-31 DIAGNOSIS — K219 Gastro-esophageal reflux disease without esophagitis: Secondary | ICD-10-CM | POA: Diagnosis not present

## 2018-08-31 DIAGNOSIS — J309 Allergic rhinitis, unspecified: Secondary | ICD-10-CM | POA: Diagnosis not present

## 2018-09-05 ENCOUNTER — Emergency Department (HOSPITAL_COMMUNITY): Payer: Medicare HMO

## 2018-09-05 ENCOUNTER — Inpatient Hospital Stay (HOSPITAL_COMMUNITY)
Admission: EM | Admit: 2018-09-05 | Discharge: 2018-09-07 | DRG: 193 | Disposition: A | Discharge status: Home / Self Care | Payer: Medicare HMO | Attending: Internal Medicine | Admitting: Internal Medicine

## 2018-09-05 ENCOUNTER — Encounter (HOSPITAL_COMMUNITY): Payer: Self-pay | Admitting: Emergency Medicine

## 2018-09-05 ENCOUNTER — Other Ambulatory Visit: Payer: Self-pay

## 2018-09-05 DIAGNOSIS — J386 Stenosis of larynx: Secondary | ICD-10-CM | POA: Diagnosis not present

## 2018-09-05 DIAGNOSIS — Z888 Allergy status to other drugs, medicaments and biological substances status: Secondary | ICD-10-CM | POA: Diagnosis not present

## 2018-09-05 DIAGNOSIS — Z8589 Personal history of malignant neoplasm of other organs and systems: Secondary | ICD-10-CM

## 2018-09-05 DIAGNOSIS — I4510 Unspecified right bundle-branch block: Secondary | ICD-10-CM | POA: Diagnosis present

## 2018-09-05 DIAGNOSIS — K219 Gastro-esophageal reflux disease without esophagitis: Secondary | ICD-10-CM | POA: Diagnosis present

## 2018-09-05 DIAGNOSIS — E781 Pure hyperglyceridemia: Secondary | ICD-10-CM | POA: Diagnosis present

## 2018-09-05 DIAGNOSIS — Z930 Tracheostomy status: Secondary | ICD-10-CM

## 2018-09-05 DIAGNOSIS — Z7982 Long term (current) use of aspirin: Secondary | ICD-10-CM

## 2018-09-05 DIAGNOSIS — J189 Pneumonia, unspecified organism: Principal | ICD-10-CM

## 2018-09-05 DIAGNOSIS — Z87442 Personal history of urinary calculi: Secondary | ICD-10-CM | POA: Diagnosis not present

## 2018-09-05 DIAGNOSIS — I2510 Atherosclerotic heart disease of native coronary artery without angina pectoris: Secondary | ICD-10-CM | POA: Diagnosis not present

## 2018-09-05 DIAGNOSIS — Z809 Family history of malignant neoplasm, unspecified: Secondary | ICD-10-CM | POA: Diagnosis not present

## 2018-09-05 DIAGNOSIS — Z949 Transplanted organ and tissue status, unspecified: Secondary | ICD-10-CM

## 2018-09-05 DIAGNOSIS — E782 Mixed hyperlipidemia: Secondary | ICD-10-CM | POA: Diagnosis present

## 2018-09-05 DIAGNOSIS — Z8521 Personal history of malignant neoplasm of larynx: Secondary | ICD-10-CM | POA: Diagnosis not present

## 2018-09-05 DIAGNOSIS — J9621 Acute and chronic respiratory failure with hypoxia: Secondary | ICD-10-CM | POA: Diagnosis not present

## 2018-09-05 DIAGNOSIS — Z882 Allergy status to sulfonamides status: Secondary | ICD-10-CM

## 2018-09-05 DIAGNOSIS — R0602 Shortness of breath: Secondary | ICD-10-CM | POA: Diagnosis not present

## 2018-09-05 DIAGNOSIS — M545 Low back pain: Secondary | ICD-10-CM | POA: Diagnosis not present

## 2018-09-05 DIAGNOSIS — Z8673 Personal history of transient ischemic attack (TIA), and cerebral infarction without residual deficits: Secondary | ICD-10-CM

## 2018-09-05 DIAGNOSIS — R509 Fever, unspecified: Secondary | ICD-10-CM | POA: Diagnosis present

## 2018-09-05 DIAGNOSIS — I739 Peripheral vascular disease, unspecified: Secondary | ICD-10-CM | POA: Diagnosis present

## 2018-09-05 DIAGNOSIS — Z87891 Personal history of nicotine dependence: Secondary | ICD-10-CM | POA: Diagnosis not present

## 2018-09-05 DIAGNOSIS — Z9221 Personal history of antineoplastic chemotherapy: Secondary | ICD-10-CM

## 2018-09-05 DIAGNOSIS — J168 Pneumonia due to other specified infectious organisms: Secondary | ICD-10-CM | POA: Diagnosis not present

## 2018-09-05 DIAGNOSIS — G8929 Other chronic pain: Secondary | ICD-10-CM | POA: Diagnosis not present

## 2018-09-05 DIAGNOSIS — E785 Hyperlipidemia, unspecified: Secondary | ICD-10-CM | POA: Diagnosis present

## 2018-09-05 DIAGNOSIS — R05 Cough: Secondary | ICD-10-CM | POA: Diagnosis not present

## 2018-09-05 DIAGNOSIS — Z8249 Family history of ischemic heart disease and other diseases of the circulatory system: Secondary | ICD-10-CM

## 2018-09-05 DIAGNOSIS — Z8349 Family history of other endocrine, nutritional and metabolic diseases: Secondary | ICD-10-CM

## 2018-09-05 DIAGNOSIS — I10 Essential (primary) hypertension: Secondary | ICD-10-CM | POA: Diagnosis present

## 2018-09-05 DIAGNOSIS — Z951 Presence of aortocoronary bypass graft: Secondary | ICD-10-CM | POA: Diagnosis not present

## 2018-09-05 DIAGNOSIS — Z880 Allergy status to penicillin: Secondary | ICD-10-CM

## 2018-09-05 DIAGNOSIS — Z7902 Long term (current) use of antithrombotics/antiplatelets: Secondary | ICD-10-CM

## 2018-09-05 LAB — CBC WITH DIFFERENTIAL/PLATELET
Abs Immature Granulocytes: 0.17 10*3/uL — ABNORMAL HIGH (ref 0.00–0.07)
Basophils Absolute: 0.1 10*3/uL (ref 0.0–0.1)
Basophils Relative: 0 %
Eosinophils Absolute: 0.3 10*3/uL (ref 0.0–0.5)
Eosinophils Relative: 2 %
HCT: 42.2 % (ref 39.0–52.0)
Hemoglobin: 13.4 g/dL (ref 13.0–17.0)
Immature Granulocytes: 1 %
Lymphocytes Relative: 16 %
Lymphs Abs: 2.6 10*3/uL (ref 0.7–4.0)
MCH: 29 pg (ref 26.0–34.0)
MCHC: 31.8 g/dL (ref 30.0–36.0)
MCV: 91.3 fL (ref 80.0–100.0)
Monocytes Absolute: 1 10*3/uL (ref 0.1–1.0)
Monocytes Relative: 7 %
Neutro Abs: 11.8 10*3/uL — ABNORMAL HIGH (ref 1.7–7.7)
Neutrophils Relative %: 74 %
Platelets: 470 10*3/uL — ABNORMAL HIGH (ref 150–400)
RBC: 4.62 MIL/uL (ref 4.22–5.81)
RDW: 14.7 % (ref 11.5–15.5)
WBC: 16 10*3/uL — ABNORMAL HIGH (ref 4.0–10.5)
nRBC: 0 % (ref 0.0–0.2)

## 2018-09-05 LAB — BASIC METABOLIC PANEL
Anion gap: 13 (ref 5–15)
BUN: 20 mg/dL (ref 8–23)
CO2: 25 mmol/L (ref 22–32)
Calcium: 9.6 mg/dL (ref 8.9–10.3)
Chloride: 99 mmol/L (ref 98–111)
Creatinine, Ser: 1.25 mg/dL — ABNORMAL HIGH (ref 0.61–1.24)
GFR calc Af Amer: >60 mL/min (ref >60)
GFR calc non Af Amer: 54 mL/min — ABNORMAL LOW (ref >60)
Glucose, Bld: 91 mg/dL (ref 70–99)
Potassium: 3.8 mmol/L (ref 3.5–5.1)
Sodium: 137 mmol/L (ref 135–145)

## 2018-09-05 LAB — INFLUENZA PANEL BY PCR (TYPE A & B)
Influenza A By PCR: NEGATIVE
Influenza B By PCR: NEGATIVE

## 2018-09-05 LAB — TROPONIN I: Troponin I: <0.03 ng/mL (ref <0.03)

## 2018-09-05 MED ORDER — ASPIRIN 325 MG PO TABS
325.0000 mg | ORAL_TABLET | Freq: Every day | ORAL | Status: DC
  Administered 2018-09-06 – 2018-09-07 (×2): 325 mg via ORAL
  Filled 2018-09-05 (×2): qty 1

## 2018-09-05 MED ORDER — CLOPIDOGREL BISULFATE 75 MG PO TABS
75.0000 mg | ORAL_TABLET | Freq: Every day | ORAL | Status: DC
  Administered 2018-09-06 – 2018-09-07 (×2): 75 mg via ORAL
  Filled 2018-09-05 (×2): qty 1

## 2018-09-05 MED ORDER — SODIUM CHLORIDE 0.9 % IV BOLUS
1000.0000 mL | Freq: Once | INTRAVENOUS | Status: AC
  Administered 2018-09-05: 1000 mL via INTRAVENOUS

## 2018-09-05 MED ORDER — ORAL CARE MOUTH RINSE
15.0000 mL | Freq: Two times a day (BID) | OROMUCOSAL | Status: DC
  Administered 2018-09-06 – 2018-09-07 (×2): 15 mL via OROMUCOSAL

## 2018-09-05 MED ORDER — ACETAMINOPHEN 650 MG RE SUPP: 650.0000 mg | Freq: Four times a day (QID) | RECTAL | Status: DC | PRN

## 2018-09-05 MED ORDER — ONDANSETRON HCL 4 MG PO TABS: 4.0000 mg | ORAL_TABLET | Freq: Four times a day (QID) | ORAL | Status: DC | PRN

## 2018-09-05 MED ORDER — IPRATROPIUM-ALBUTEROL 0.5-2.5 (3) MG/3ML IN SOLN
3.0000 mL | Freq: Three times a day (TID) | RESPIRATORY_TRACT | Status: AC
  Administered 2018-09-05 – 2018-09-06 (×2): 3 mL via RESPIRATORY_TRACT
  Filled 2018-09-05 (×3): qty 3

## 2018-09-05 MED ORDER — ENOXAPARIN SODIUM 40 MG/0.4ML ~~LOC~~ SOLN
40.0000 mg | SUBCUTANEOUS | Status: DC
  Administered 2018-09-05 – 2018-09-06 (×2): 40 mg via SUBCUTANEOUS
  Filled 2018-09-05 (×2): qty 0.4

## 2018-09-05 MED ORDER — GUAIFENESIN-DM 100-10 MG/5ML PO SYRP
10.0000 mL | ORAL_SOLUTION | Freq: Three times a day (TID) | ORAL | Status: AC
  Administered 2018-09-05 – 2018-09-06 (×2): 10 mL via ORAL
  Filled 2018-09-05 (×4): qty 10

## 2018-09-05 MED ORDER — SPIRONOLACTONE 25 MG PO TABS
25.0000 mg | ORAL_TABLET | Freq: Every evening | ORAL | Status: DC
  Administered 2018-09-05 – 2018-09-06 (×2): 25 mg via ORAL
  Filled 2018-09-05 (×2): qty 1

## 2018-09-05 MED ORDER — PAROXETINE HCL 20 MG PO TABS
20.0000 mg | ORAL_TABLET | Freq: Every day | ORAL | Status: DC
  Administered 2018-09-05 – 2018-09-06 (×2): 20 mg via ORAL
  Filled 2018-09-05 (×2): qty 1

## 2018-09-05 MED ORDER — ONDANSETRON HCL 4 MG/2ML IJ SOLN: 4.0000 mg | Freq: Four times a day (QID) | INTRAMUSCULAR | Status: DC | PRN

## 2018-09-05 MED ORDER — ACETAMINOPHEN 325 MG PO TABS
650.0000 mg | ORAL_TABLET | Freq: Four times a day (QID) | ORAL | Status: DC | PRN
  Administered 2018-09-05 – 2018-09-06 (×3): 650 mg via ORAL
  Filled 2018-09-05 (×3): qty 2

## 2018-09-05 MED ORDER — POLYETHYLENE GLYCOL 3350 17 G PO PACK: 17.0000 g | PACK | Freq: Every day | ORAL | Status: DC | PRN

## 2018-09-05 MED ORDER — EZETIMIBE 10 MG PO TABS
10.0000 mg | ORAL_TABLET | Freq: Every day | ORAL | Status: DC
  Administered 2018-09-06 – 2018-09-07 (×2): 10 mg via ORAL
  Filled 2018-09-05 (×2): qty 1

## 2018-09-05 MED ORDER — METOPROLOL TARTRATE 25 MG PO TABS
25.0000 mg | ORAL_TABLET | Freq: Two times a day (BID) | ORAL | Status: DC
  Administered 2018-09-05 – 2018-09-07 (×4): 25 mg via ORAL
  Filled 2018-09-05 (×4): qty 1

## 2018-09-05 MED ORDER — AMLODIPINE BESYLATE 5 MG PO TABS
10.0000 mg | ORAL_TABLET | Freq: Every day | ORAL | Status: DC
  Administered 2018-09-06 – 2018-09-07 (×2): 10 mg via ORAL
  Filled 2018-09-05 (×2): qty 2

## 2018-09-05 MED ORDER — LEVOFLOXACIN IN D5W 500 MG/100ML IV SOLN
500.0000 mg | Freq: Once | INTRAVENOUS | Status: AC
  Administered 2018-09-05: 500 mg via INTRAVENOUS
  Filled 2018-09-05: qty 100

## 2018-09-05 NOTE — H&P (Signed)
History and Physical    Kenneth [name] WGY:[id number] DOB: [birth year] DOA: 09/05/2018  PCP: [name], MD   Patient coming from: Home  I have personally briefly reviewed patient's old medical records in [place]  Chief Complaint: SOB, Cough  HPI: Kenneth [name] is a 78 y.o. male with medical history significant for tracheostomy for laryngeal malignancy, HTN, carotid artery stenosis, CVA, who presented to the ED with complaints of difficulty breathing with exertion, worsening productive cough of yellowish-green sputum through trach, and fevers over the past week.  Patient saw his PCP and was prescribed doxycycline 6 days ago.  Despite compliance symptoms have persisted. No chest pain, no leg swelling.  As for his tracheostomy, he is mostly on room air, but requires humidified oxygen for a few hours a day.  ED Course: Temperature 99.6, stable vitals.  Leukocytosis 16.  Negative influenza and troponin.  Two-view chest x-ray significant interstitial opacities bilaterally right greater than left, findings consistent with pneumonitis.  Patient was given IV Levaquin.  1 L bolus.  Hospitalist to admit for tracheostomy related pneumonia, after failure of outpatient treatment  Review of Systems: As per HPI all other systems reviewed and negative.  Past Medical History:  Diagnosis Date  . Anxiety   . 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 ASCVD (arteriosclerotic cardiovascular disease)   . Chronic lower back pain    "have it at night" (05/04/2018)  . Coronary artery disease    a. 2003: s/p CABG x3V  b. 2007: cath with patent bypass grafts.  c. 09/2016: [place] with cath showing patent bypass grafts, medical Rx recommended  . CVA (cerebral vascular accident) ([place]) 10/2017   "little numb on my left face since" (05/04/2018)  . Depression   . GERD (gastroesophageal reflux disease)   . High triglycerides   . History of kidney stones   . Hypertension   . 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  Malodorous urine    "in the last 5wks; since I had trach put in" (05/04/2018)  . Peripheral vascular disease ([place])   . Stroke [facility])    "he's had several little strokes; many that he wasn't aware of" (05/04/2018)  . Tobacco abuse   . Tracheal stenosis     Past Surgical History:  Procedure Laterality Date  . CAROTID STENT Right 06-13-12; 05/04/2018  . CAROTID STENT INSERTION N/A 06/13/2012   Procedure: CAROTID STENT INSERTION;  Surgeon: Serafina Mitchell, MD;  Location: [facility] CATH LAB;  Service: Cardiovascular;  Laterality: N/A;  . CATARACT EXTRACTION, BILATERAL Bilateral   . COLONOSCOPY  11/10/2011   Dr. [name]: hemorrhoids, tubular adenoma  . CORNEAL TRANSPLANT Bilateral   . CORONARY ARTERY BYPASS GRAFT  ~ 2003   "CABG X3"  . EYE SURGERY    . INSERTION OF RETROGRADE CAROTID STENT Right 05/04/2018   Procedure: INSERTION OF RIGHT  CAROTID STENT using an ABBOT- XACT carotid stent system;  Surgeon: Serafina Mitchell, MD;  Location: [facility];  Service: Vascular;  Laterality: Right;  . LAPAROSCOPIC CHOLECYSTECTOMY  2007  . LEFT HEART CATH AND CORS/GRAFTS ANGIOGRAPHY N/A 10/07/2016   Procedure: Left Heart Cath and Cors/Grafts Angiography;  Surgeon: Troy Sine, MD;  Location: [facility] CV LAB;  Service: Cardiovascular;  Laterality: N/A;  . PARTIAL LARYNGECTOMY  1998  . TRACHEOSTOMY  03/13/2018   "@ [facility]"     reports that he quit smoking about 48 years ago. His smoking use included cigarettes. He started smoking about 65 years  ago. He has a 15.00 pack-year smoking history. He quit smokeless tobacco use about 16 years ago.  His smokeless tobacco use included chew. He reports that he does not drink alcohol or use drugs.  Allergies  Allergen Reactions  . Nifedipine Other (See Comments)  . Lorazepam Other (See Comments)    REACTION: Alters mental status. "Turns into maniac" REACTION: Alters mental status. "Turns into maniac" REACTION: Alters mental status. "Turns into maniac"  . Other      REACTION: Unknown to patient. States that MD states allergy per medical records  . Penicillins Hives    Has patient had a PCN reaction causing immediate rash, facial/tongue/throat swelling, SOB or lightheadedness with hypotension: Yes Has patient had a PCN reaction causing severe rash involving mucus membranes or skin necrosis: No Has patient had a PCN reaction that required hospitalization No Has patient had a PCN reaction occurring within the last 10 years: No If all of the above answers are "NO", then may proceed with Cephalosporin use.  Has patient had a PCN reaction causing immediate rash, facial/tongue/throat swelling, SOB or lightheadedness with hypotension: Yes Has patient had a PCN reaction causing severe rash involving mucus membranes or skin necrosis: No Has patient had a PCN reaction that required hospitalization No Has patient had a PCN reaction occurring within the last 10 years: No If all of the above answers are "NO", then may proceed with Cephalosporin use.  . Sulfacetamide     REACTION: Unknown to patient. States that MD states allergy per medical records  . Sulfonamide Derivatives Other (See Comments)    REACTION: Unknown to patient. States that MD states allergy per medical records  . Erythromycin Rash  . Statins Itching and Rash    Family History  Problem Relation Age of Onset  . Dementia Mother   . Heart disease Father   . Cancer Brother   . Heart disease Brother   . Hyperlipidemia Brother   . Cancer Brother   . Colon cancer Neg Hx     Prior to Admission medications   Medication Sig Start Date End Date Taking? Authorizing Provider  acetaminophen (TYLENOL) 500 MG tablet Take 1,000 mg by mouth once as needed for mild pain or fever.   Yes [provider]  amLODipine (NORVASC) 10 MG tablet Take 1 tablet (10 mg total) by mouth daily. 08/26/17 06/10/26 Yes BranchAlphonse Guild, MD  aspirin 325 MG tablet Take 1 tablet (325 mg total) by mouth daily. 11/16/17  Yes  Johnson, Clanford L, MD  clopidogrel (PLAVIX) 75 MG tablet Take 1 tablet (75 mg total) by mouth daily. 12/26/17  Yes Branch, Alphonse Guild, MD  doxycycline (VIBRA-TABS) 100 MG tablet Take 100 mg by mouth 2 (two) times daily. 10 day course starting on 08/30/2018   Yes [provider]  ezetimibe (ZETIA) 10 MG tablet Take 1 tablet (10 mg total) by mouth daily. 09/07/17 06/10/26 Yes BranchAlphonse Guild, MD  metoprolol tartrate (LOPRESSOR) 25 MG tablet Take 25 mg by mouth 2 (two) times daily. 03/16/17  Yes [provider]  nitroGLYCERIN (NITROSTAT) 0.4 MG SL tablet Place 1 tablet (0.4 mg total) under the tongue every 5 (five) minutes as needed for chest pain. 01/31/17 05/25/21 Yes Lendon Colonel, NP  PARoxetine (PAXIL) 20 MG tablet Take 20 mg by mouth at bedtime.    Yes [provider]  spironolactone (ALDACTONE) 25 MG tablet Take 25 mg every evening by mouth.   Yes [provider]  omeprazole (PRILOSEC) 20  MG capsule Take 20 mg every evening by mouth.     [provider]    Physical Exam: Vitals:   09/05/18 1527 09/05/18 1745 09/05/18 1800 09/05/18 1830  BP:    (!) 142/73  Pulse:  74 90 90  Resp:  20 (!) 27 (!) 21  Temp:    98.3 F (36.8 C)  TempSrc:    Oral  SpO2:  96% 97% 97%  Weight: 82.1 kg     Height: 5\' 5"  (1.651 m)       Constitutional: NAD, calm, comfortable Vitals:   09/05/18 1527 09/05/18 1745 09/05/18 1800 09/05/18 1830  BP:    (!) 142/73  Pulse:  74 90 90  Resp:  20 (!) 27 (!) 21  Temp:    98.3 F (36.8 C)  TempSrc:    Oral  SpO2:  96% 97% 97%  Weight: 82.1 kg     Height: 5\' 5"  (1.651 m)      Eyes: PERRL, lids and conjunctivae normal ENMT: Mucous membranes are moist. Posterior pharynx clear of any exudate or lesions.Normal dentition.  Neck: Tracheostomy, supple, no masses, no thyromegaly Respiratory: Tracheostomy, coarse breath sounds, crackles bases. Normal respiratory effort. No accessory muscle use.  Cardiovascular: Regular  rate and rhythm, no murmurs / rubs / gallops. No extremity edema. 2+ pedal pulses. No carotid bruits.  Abdomen: no tenderness, no masses palpated. No hepatosplenomegaly. Bowel sounds positive.  Musculoskeletal: no clubbing / cyanosis. No joint deformity upper and lower extremities. Good ROM, no contractures. Normal muscle tone.  Skin: no rashes, lesions, ulcers. No induration Neurologic: CN 2-12 grossly intact. Sensation intact, DTR normal. Strength 5/5 in all 4.  Psychiatric: Normal judgment and insight. Alert and oriented x 3. Normal mood.   Labs on Admission: I have personally reviewed following labs and imaging studies  CBC: Recent Labs  Lab 09/05/18 1556  WBC 16.0*  NEUTROABS 11.8*  HGB 13.4  HCT 42.2  MCV 91.3  PLT 614*   Basic Metabolic Panel: Recent Labs  Lab 09/05/18 1556  NA 137  K 3.8  CL 99  CO2 25  GLUCOSE 91  BUN 20  CREATININE 1.25*  CALCIUM 9.6   Cardiac Enzymes: Recent Labs  Lab 09/05/18 1731  TROPONINI <0.03    Radiological Exams on Admission: Dg Chest 2 View  Result Date: 09/05/2018 CLINICAL DATA:  Patient has a trach and has had fever x 1 week.--- PCP advised to come to ER-- coughing up yellow/green sputum through trach. sob on exertion, hx throat ca EXAM: CHEST - 2 VIEW COMPARISON:  06/10/2018 FINDINGS: Heart size is normal. Status post median sternotomy and CABG. Elevation of the RIGHT hemidiaphragm is stable. Interval development of significant but patchy interstitial opacities bilaterally, RIGHT greater than LEFT. No frank consolidations. No pleural effusions or edema. Degenerative changes are seen in thoracic spine. IMPRESSION: Interval development of significant interstitial opacities bilaterally, RIGHT greater than LEFT. Findings are consistent with pneumonitis. Electronically Signed   By: Nolon Nations M.D.   On: 09/05/2018 15:57    EKG: Independently reviewed.  Anus rhythm.  Right bundle branch block.  QTc 441.  EKG unchanged from  prior.  Assessment/Plan Active Problems:   SOB (shortness of breath)   Pneumonitis/pneumonia-dyspnea with exertion, productive cough.  Leukocytosis 16.  Afebrile here.  Underwent tracheostomy 03/06/2018. chest x-ray significant opacities right greater than left.  No history of MRSA or multidrug-resistant infection. -Continue IV Levaquin, penicillin allergy noted. -CBC a.m. -Duo nebs, mucolytics  Laryngeal cancer, tracheostomy  status 02/2018-treated for laryngeal cancer 20 years ago with surgery and chemo, per care- everywhere.  Tracheostomy for glottic stenosis, bilateral vocal cord paralysis and dyspnea. Patient on room air, but requires humidified O2 a few hours a day.  Gets tracheostomy change every few months at [facility]. -Respiratory therapy consult  Hypertension-systolic 546E to 703J. -Continue home Aldactone, Norvasc, metoprolol  CVA, Carotid artery stenosis-stable.  Has undergone carotid artery stenting. On dual antiplatelet. -Continue home aspirin and Plavix   DVT prophylaxis: Enoxaparin Code Status: Full Family Communication: None at bedside Disposition Plan: Per rounding team Consults called: None Admission status: Inpt, med surg   Bethena Roys MD [facility]  09/05/2018, 8:00 PM

## 2018-09-05 NOTE — ED Provider Notes (Signed)
[facility] Emergency Department Provider Note MRN:  [id number]  Arrival date & time: 09/06/18     Chief Complaint   Fever   History of Present Illness   Kenneth [name] is a 78 y.o. 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  Patient began having cough and subjective fever at home 1 week ago, was started on doxycycline 6 days ago.  Despite antibiotics, continued nighttime fevers, chills, productive sputum from his tracheostomy tube.  General malaise, body aches, significant worsening dyspnea on exertion.  Denies chest pain, no abdominal pain.  Symptoms are constant, no exacerbating or relieving factors.  Review of Systems  A complete 10 system review of systems was obtained and all systems are negative except as noted in the HPI and PMH.   Patient's Health History    Past Medical History:  Diagnosis Date  . Anxiety   . 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 ASCVD (arteriosclerotic cardiovascular disease)   . Chronic lower back pain    "have it at night" (05/04/2018)  . Coronary artery disease    a. 2003: s/p CABG x3V  b. 2007: cath with patent bypass grafts.  c. 09/2016: [place] with cath showing patent bypass grafts, medical Rx recommended  . CVA (cerebral vascular accident) ([place]) 10/2017   "little numb on my left face since" (05/04/2018)  . Depression   . GERD (gastroesophageal reflux disease)   . High triglycerides   . History of kidney stones   . Hypertension   . 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 Malodorous urine    "in the last 5wks; since I had trach put in" (05/04/2018)  . Peripheral vascular disease ([place])   . Stroke [facility])    "he's had several little strokes; many that he wasn't aware of" (05/04/2018)  . Tobacco abuse   . Tracheal stenosis     Past Surgical History:  Procedure Laterality Date  . CAROTID STENT Right 06-13-12; 05/04/2018  . CAROTID STENT  INSERTION N/A 06/13/2012   Procedure: CAROTID STENT INSERTION;  Surgeon: Serafina Mitchell, MD;  Location: [facility] CATH LAB;  Service: Cardiovascular;  Laterality: N/A;  . CATARACT EXTRACTION, BILATERAL Bilateral   . COLONOSCOPY  11/10/2011   Dr. [name]: hemorrhoids, tubular adenoma  . CORNEAL TRANSPLANT Bilateral   . CORONARY ARTERY BYPASS GRAFT  ~ 2003   "CABG X3"  . EYE SURGERY    . INSERTION OF RETROGRADE CAROTID STENT Right 05/04/2018   Procedure: INSERTION OF RIGHT  CAROTID STENT using an ABBOT- XACT carotid stent system;  Surgeon: Serafina Mitchell, MD;  Location: [place];  Service: Vascular;  Laterality: Right;  . LAPAROSCOPIC CHOLECYSTECTOMY  2007  . LEFT HEART CATH AND CORS/GRAFTS ANGIOGRAPHY N/A 10/07/2016   Procedure: Left Heart Cath and Cors/Grafts Angiography;  Surgeon: Troy Sine, MD;  Location: [place] CV LAB;  Service: Cardiovascular;  Laterality: N/A;  . PARTIAL LARYNGECTOMY  1998  . TRACHEOSTOMY  03/13/2018   "@ [facility]"    Family History  Problem Relation Age of Onset  . Dementia Mother   . Heart disease Father   . Cancer Brother   . Heart disease Brother   . Hyperlipidemia Brother   . Cancer Brother   . Colon cancer Neg Hx     Social History   Socioeconomic History  . Marital status: Married    Spouse name: Not on file  .  Number of children: Not on file  . Years of education: Not on file  . Highest education level: Not on file  Occupational History  . Occupation: retired    Comment: Warden/ranger  Social Needs  . Financial resource strain: Not on file  . Food insecurity:    Worry: Not on file    Inability: Not on file  . Transportation needs:    Medical: Not on file    Non-medical: Not on file  Tobacco Use  . Smoking status: Former Smoker    Packs/day: 1.00    Years: 15.00    Pack years: 15.00    Types: Cigarettes    Start date: 04/22/1953    Last attempt to quit: 07/26/1970    Years since quitting: 48.1  . Smokeless tobacco: Former Systems developer     Types: [place] date: 05/05/2002  Substance and Sexual Activity  . Alcohol use: Never    Alcohol/week: 0.0 standard drinks    Frequency: Never  . Drug use: Never  . Sexual activity: Not Currently  Lifestyle  . Physical activity:    Days per week: Not on file    Minutes per session: Not on file  . Stress: Not on file  Relationships  . Social connections:    Talks on phone: Not on file    Gets together: Not on file    Attends religious service: Not on file    Active member of club or organization: Not on file    Attends meetings of clubs or organizations: Not on file    Relationship status: Not on file  . Intimate partner violence:    Fear of current or ex partner: Not on file    Emotionally abused: Not on file    Physically abused: Not on file    Forced sexual activity: Not on file  Other Topics Concern  . Not on file  Social History Narrative   Walks on the treadmill every other day at the [facility] for the last couple of weeks.           Physical Exam  Vital Signs and Nursing Notes reviewed Vitals:   09/05/18 2207 09/05/18 2238  BP: (!) 142/83   Pulse: 98   Resp:    Temp: (!) 100.5 F (38.1 C)   SpO2: 97% 97%    CONSTITUTIONAL: Chronically ill-appearing, NAD NEURO:  Alert and oriented x 3, no focal deficits EYES:  eyes equal and reactive ENT/NECK:  no LAD, no JVD; tracheostomy tube in place CARDIO: Regular rate, well-perfused, normal S1 and S2 PULM:  CTAB no wheezing or rhonchi GI/GU:  normal bowel sounds, non-distended, non-tender MSK/SPINE:  No gross deformities, no edema SKIN:  no rash, atraumatic PSYCH:  Appropriate speech and behavior  Diagnostic and Interventional Summary    EKG Interpretation  Date/Time:  Tuesday September 05 2018 17:34:26 EST Ventricular Rate:  79 PR Interval:    QRS Duration: 123 QT Interval:  384 QTC Calculation: 441 R Axis:   44 Text Interpretation:  Sinus rhythm Supraventricular bigeminy Right bundle branch block Confirmed  by Gerlene Fee [phone]) on 09/05/2018 5:38:05 PM      Labs Reviewed  CBC WITH DIFFERENTIAL/PLATELET - Abnormal; Notable for the following components:      Result Value   WBC 16.0 (*)    Platelets 470 (*)    Neutro Abs 11.8 (*)    Abs Immature Granulocytes 0.17 (*)    All other components within normal limits  BASIC METABOLIC PANEL - Abnormal; Notable for the following components:   Creatinine, Ser 1.25 (*)    GFR calc non Af Amer 54 (*)    All other components within normal limits  CULTURE, BLOOD (ROUTINE X 2)  CULTURE, BLOOD (ROUTINE X 2)  TROPONIN I  INFLUENZA PANEL BY PCR (TYPE A & B)  CBC    DG Chest 2 View  Final Result      Medications  enoxaparin (LOVENOX) injection 40 mg (40 mg Subcutaneous Given 09/05/18 2210)  ondansetron (ZOFRAN) tablet 4 mg (has no administration in time range)    Or  ondansetron (ZOFRAN) injection 4 mg (has no administration in time range)  polyethylene glycol (MIRALAX / GLYCOLAX) packet 17 g (has no administration in time range)  acetaminophen (TYLENOL) tablet 650 mg (650 mg Oral Given 09/05/18 2157)    Or  acetaminophen (TYLENOL) suppository 650 mg ( Rectal See Alternative 09/05/18 2157)  ipratropium-albuterol (DUONEB) 0.5-2.5 (3) MG/3ML nebulizer solution 3 mL (3 mLs Nebulization Given 09/05/18 2236)  guaiFENesin-dextromethorphan (ROBITUSSIN DM) 100-10 MG/5ML syrup 10 mL (10 mLs Oral Given 09/05/18 2210)  amLODipine (NORVASC) tablet 10 mg (has no administration in time range)  aspirin tablet 325 mg (has no administration in time range)  clopidogrel (PLAVIX) tablet 75 mg (has no administration in time range)  ezetimibe (ZETIA) tablet 10 mg (has no administration in time range)  metoprolol tartrate (LOPRESSOR) tablet 25 mg (25 mg Oral Given 09/05/18 2210)  PARoxetine (PAXIL) tablet 20 mg (20 mg Oral Given 09/05/18 2210)  spironolactone (ALDACTONE) tablet 25 mg (25 mg Oral Given 09/05/18 2210)  MEDLINE mouth rinse (has no administration in time range)   levofloxacin (LEVAQUIN) IVPB 500 mg (0 mg Intravenous Stopped 09/05/18 1844)  sodium chloride 0.9 % bolus 1,000 mL (0 mLs Intravenous Stopped 09/05/18 1834)     Procedures Critical Care  ED Course and Medical Decision Making  I have reviewed the triage vital signs and the nursing notes.  Pertinent labs & imaging results that were available during my care of the patient were reviewed by me and considered in my medical decision making (see below for details).  Concern for pneumonia failing outpatient management in this 78 year old male with chronic tracheostomy tube.  Chest x-ray read as pneumonitis but given the clinical picture of persistent fever, cough, dyspnea exertion, leukocytosis here today, more concern for pneumonia clinically.  Will provide levofloxacin for pseudomonal coverage (penicillin allergy), will admit to hospital service.  Admitted to hospital service for further care.  Barth Kirks. Sedonia Small, MD [place] [email or web address]  Final Clinical Impressions(s) / ED Diagnoses     ICD-10-CM   1. Pneumonia due to infectious organism, unspecified laterality, unspecified part of lung J18.9   2. Tracheostomy status [facility]) Z93.0     ED Discharge Orders    None         Maudie Flakes, MD 09/06/18 [phone]

## 2018-09-05 NOTE — ED Triage Notes (Addendum)
Patient has a trach and has had fever x 1 week. Spouse states patient saw PCP on Wednesday and was placed on doxycycline. States PCP advised to come to ER since patient is no better. Patient complaining of lower back pain. States coughing up yellow/green sputum through trach. States he took two tylenol at 1330 today.

## 2018-09-06 DIAGNOSIS — Z930 Tracheostomy status: Secondary | ICD-10-CM

## 2018-09-06 DIAGNOSIS — J189 Pneumonia, unspecified organism: Secondary | ICD-10-CM

## 2018-09-06 DIAGNOSIS — J9621 Acute and chronic respiratory failure with hypoxia: Secondary | ICD-10-CM

## 2018-09-06 HISTORY — DX: Acute and chronic respiratory failure with hypoxia: J96.21

## 2018-09-06 LAB — CBC
HCT: 37 % — ABNORMAL LOW (ref 39.0–52.0)
Hemoglobin: 11.5 g/dL — ABNORMAL LOW (ref 13.0–17.0)
MCH: 28.3 pg (ref 26.0–34.0)
MCHC: 31.1 g/dL (ref 30.0–36.0)
MCV: 90.9 fL (ref 80.0–100.0)
Platelets: 411 10*3/uL — ABNORMAL HIGH (ref 150–400)
RBC: 4.07 MIL/uL — ABNORMAL LOW (ref 4.22–5.81)
RDW: 14.6 % (ref 11.5–15.5)
WBC: 12.4 10*3/uL — ABNORMAL HIGH (ref 4.0–10.5)
nRBC: 0 % (ref 0.0–0.2)

## 2018-09-06 LAB — RESPIRATORY PANEL BY PCR

## 2018-09-06 MED ORDER — TRAZODONE HCL 50 MG PO TABS
50.0000 mg | ORAL_TABLET | Freq: Once | ORAL | Status: AC
  Administered 2018-09-06: 50 mg via ORAL
  Filled 2018-09-06: qty 1

## 2018-09-06 MED ORDER — LEVOFLOXACIN IN D5W 750 MG/150ML IV SOLN
750.0000 mg | INTRAVENOUS | Status: DC
  Administered 2018-09-06: 750 mg via INTRAVENOUS
  Filled 2018-09-06: qty 150

## 2018-09-06 NOTE — Progress Notes (Signed)
Pharmacy Antibiotic Note  Kenneth [name] is a 78 y.o. male admitted on 09/05/2018 with pneumonia.  Pharmacy has been consulted for levofloxacin dosing.  Plan: levofloxacin 750mg  iv q48h   Height: 5\' 5"  (165.1 cm) Weight: 181 lb (82.1 kg) IBW/kg (Calculated) : 61.5  Temp (24hrs), Avg:99.1 F (37.3 C), Min:98.3 F (36.8 C), Max:100.5 F (38.1 C)  Recent Labs  Lab 09/05/18 1556 09/06/18 0436  WBC 16.0* 12.4*  CREATININE 1.25*  --     Estimated Creatinine Clearance: 47.2 mL/min (A) (by C-G formula based on SCr of 1.25 mg/dL (H)).    Allergies  Allergen Reactions  . Nifedipine Other (See Comments)  . Lorazepam Other (See Comments)    REACTION: Alters mental status. "Turns into maniac" REACTION: Alters mental status. "Turns into maniac" REACTION: Alters mental status. "Turns into maniac"  . Other     REACTION: Unknown to patient. States that MD states allergy per medical records  . Penicillins Hives    Has patient had a PCN reaction causing immediate rash, facial/tongue/throat swelling, SOB or lightheadedness with hypotension: Yes Has patient had a PCN reaction causing severe rash involving mucus membranes or skin necrosis: No Has patient had a PCN reaction that required hospitalization No Has patient had a PCN reaction occurring within the last 10 years: No If all of the above answers are "NO", then may proceed with Cephalosporin use.  Has patient had a PCN reaction causing immediate rash, facial/tongue/throat swelling, SOB or lightheadedness with hypotension: Yes Has patient had a PCN reaction causing severe rash involving mucus membranes or skin necrosis: No Has patient had a PCN reaction that required hospitalization No Has patient had a PCN reaction occurring within the last 10 years: No If all of the above answers are "NO", then may proceed with Cephalosporin use.  . Sulfacetamide     REACTION: Unknown to patient. States that MD states allergy per medical records  .  Sulfonamide Derivatives Other (See Comments)    REACTION: Unknown to patient. States that MD states allergy per medical records  . Erythromycin Rash  . Statins Itching and Rash    Antimicrobials this admission: 2/12 levaquin >>    Microbiology results: 2/11 BCx: sent  Thank you for allowing pharmacy to be a part of this patient's care.  Donna Christen Diamonte Stavely 09/06/2018 7:45 AM

## 2018-09-06 NOTE — Progress Notes (Signed)
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  The patient went to see his primary care provider on 08/30/2018.  The patient was placed on doxycycline.  He had no improvement of his symptoms.  The patient has been having fevers up to 101.6 F.  He continued to have fevers on doxycycline.  He had continued dyspnea on exertion and coughing.  As result, he presented for further evaluation.  Upon presentation, WBC was 16.0.  Chest x-ray showed bilateral interstitial opacities, recurrent left.  The patient was started on levofloxacin.  Assessment/Plan: Interstitial pneumonia -Continue levofloxacin -Viral respiratory panel -Urine Legionella antigen -Urine Streptococcus pneumoniae antigen -continue duonebs -Personally reviewed chest x-ray--increased interstitial markings, recurrent left.  No edema  Acute on chronic respiratory failure with hypoxia -The patient is nonhumidified oxygen/lubrication 2 hours daily; otherwise he is on room air -Secondary to pneumonia -Wean oxygen as tolerated  Laryngeal carcinoma/vocal cord paralysis/glottic stenosis -Status post tracheostomy August 2019 -Trach last changed 07/11/2018 -[facility]  Essential hypertension -Continue amlodipine, metoprolol tartrate, spironolactone  Right carotid stenosis -Status post carotid stent 05/04/2018 -Continue aspirin and Plavix  History of ischemic stroke April 2019 -Continue aspirin and Plavix -Continue Zetia  Coronary artery disease -No chest pain  presently -Personally reviewed EKG--sinus rhythm, right bundle branch block      Disposition Plan:   Home in 1-2 days  Family Communication:   Spouse updated at bedside 2/12  Consultants:  none  Code Status:  FULL   DVT Prophylaxis:  [place] Lovenox   Procedures: As Listed in Progress Note Above  Antibiotics: levoflox 2/11>>>     Subjective: Patient continues to have coughing with yellow sputum.  He is not short of breath at baseline but has some dyspnea on exertion.  He denies any hemoptysis.  He denies any nausea, vomiting, abdominal pain.  He has had some loose stools without hematochezia or melena.  He denies any headache, sore throat, neck pain.  Objective: Vitals:   09/05/18 2207 09/05/18 2238 09/06/18 0026 09/06/18 0529  BP: (!) 142/83   135/73  Pulse: 98   60  Resp:    16  Temp: (!) 100.5 F (38.1 C)  98.6 F (37 C) 98.3 F (36.8 C)  TempSrc: Axillary  Oral Oral  SpO2: 97% 97%  96%  Weight:      Height:        Intake/Output Summary (Last 24 hours) at 09/06/2018 0839 Last data filed at 09/05/2018 1844 Gross per 24 hour  Intake 1092.12 ml  Output -  Net 1092.12 ml   Weight change:  Exam:   General:  Pt is alert, follows commands appropriately, not in acute distress  HEENT: No icterus, No thrush, No neck mass, [place]/AT  Cardiovascular: RRR, S1/S2, no rubs, no gallops  Respiratory: Bilateral scattered rales.  No wheezing.  Good air movement.  Abdomen: Soft/+BS, non tender, non distended, no guarding  Extremities: No edema, No lymphangitis, No petechiae, No rashes, no synovitis   Data Reviewed: I have personally reviewed following labs and imaging studies Basic Metabolic Panel: Recent Labs  Lab 09/05/18 1556  NA 137  K 3.8  CL 99  CO2 25  GLUCOSE 91  BUN 20  CREATININE 1.25*  CALCIUM 9.6   Liver Function Tests: No results for input(s): AST, ALT, ALKPHOS, BILITOT, PROT, ALBUMIN in the last 168 hours. No results for input(s): LIPASE,  AMYLASE in the last 168 hours. No results for input(s): AMMONIA in the last 168 hours. Coagulation Profile: No results for input(s): INR, PROTIME in the last 168 hours. CBC: Recent Labs  Lab 09/05/18 1556 09/06/18 0436  WBC 16.0* 12.4*  NEUTROABS 11.8*  --   HGB 13.4 11.5*  HCT 42.2 37.0*  MCV 91.3 90.9  PLT 470* 411*   Cardiac Enzymes: Recent Labs  Lab 09/05/18 1731  TROPONINI <0.03   BNP: Invalid input(s): POCBNP CBG: No results for input(s): GLUCAP in the last 168 hours. HbA1C: No results for input(s): HGBA1C in the last 72 hours. Urine analysis:    Component Value Date/Time   COLORURINE YELLOW 04/26/2018 [place] 04/26/2018 1138   LABSPEC 1.009 04/26/2018 1138   PHURINE 5.0 04/26/2018 [place] 04/26/2018 1138   GLUCOSEU neg 06/01/2010 [place] 04/26/2018 [place] 04/26/2018 1138   KETONESUR NEGATIVE 04/26/2018 1138   PROTEINUR NEGATIVE 04/26/2018 1138   NITRITE NEGATIVE 04/26/2018 1138   LEUKOCYTESUR LARGE (A) 04/26/2018 1138   Sepsis Labs: @LABRCNTIP (procalcitonin:4,lacticidven:4) ) Recent Results (from the past 240 hour(s))  Blood culture (routine x 2)     Status: None (Preliminary result)   Collection Time: 09/05/18  5:24 PM  Result Value Ref Range Status   Specimen Description RIGHT ANTECUBITAL  Final   Special Requests   Final    BOTTLES DRAWN AEROBIC AND ANAEROBIC Blood Culture adequate volume Performed at [facility], [address], [place], [place] [address]    Culture PENDING  Incomplete   Report Status PENDING  Incomplete     Scheduled Meds: . amLODipine  10 mg Oral Daily  . aspirin  325 mg Oral Daily  . clopidogrel  75 mg Oral Daily  . enoxaparin (LOVENOX) injection  40 mg Subcutaneous Q24H  . ezetimibe  10 mg Oral Daily  . guaiFENesin-dextromethorphan  10 mL Oral Q8H  . ipratropium-albuterol  3 mL Nebulization Q8H  . mouth rinse  15 mL Mouth Rinse BID  . metoprolol  tartrate  25 mg Oral BID  . PARoxetine  20 mg Oral QHS  . spironolactone  25 mg Oral QPM   Continuous Infusions: . levofloxacin (LEVAQUIN) IV      Procedures/Studies: Dg Chest 2 View  Result Date: 09/05/2018 CLINICAL DATA:  Patient has a trach and has had fever x 1 week.--- PCP advised to come to ER-- coughing up yellow/green sputum through trach. sob on exertion, hx throat ca EXAM: CHEST - 2 VIEW COMPARISON:  06/10/2018 FINDINGS: Heart size is normal. Status post median sternotomy and CABG. Elevation of the RIGHT hemidiaphragm is stable. Interval development of significant but patchy interstitial opacities bilaterally, RIGHT greater than LEFT. No frank consolidations. No pleural effusions or edema. Degenerative changes are seen in thoracic spine. IMPRESSION: Interval development of significant interstitial opacities bilaterally, RIGHT greater than LEFT. Findings are consistent with pneumonitis. Electronically Signed   By: Nolon Nations M.D.   On: 09/05/2018 15:57    Orson Eva, DO  [facility] Pager [phone]  If 7PM-7AM, please contact night-coverage [email or web address] Password [email or web address] 09/06/2018, 8:39 AM   LOS: 1 day

## 2018-09-06 NOTE — Progress Notes (Signed)
Offered to place pt on humidified oxygen and or to suction him but he states that he hates the humidity and he doesn't need suctioning. Emergency supplies at the bedside

## 2018-09-06 NOTE — Progress Notes (Signed)
Suctioned pt without complication

## 2018-09-07 DIAGNOSIS — Z930 Tracheostomy status: Secondary | ICD-10-CM

## 2018-09-07 DIAGNOSIS — J386 Stenosis of larynx: Secondary | ICD-10-CM

## 2018-09-07 DIAGNOSIS — I10 Essential (primary) hypertension: Secondary | ICD-10-CM

## 2018-09-07 DIAGNOSIS — J9621 Acute and chronic respiratory failure with hypoxia: Secondary | ICD-10-CM

## 2018-09-07 DIAGNOSIS — E782 Mixed hyperlipidemia: Secondary | ICD-10-CM

## 2018-09-07 DIAGNOSIS — J189 Pneumonia, unspecified organism: Principal | ICD-10-CM

## 2018-09-07 LAB — CBC
HCT: 38.5 % — ABNORMAL LOW (ref 39.0–52.0)
Hemoglobin: 12 g/dL — ABNORMAL LOW (ref 13.0–17.0)
MCH: 28.5 pg (ref 26.0–34.0)
MCHC: 31.2 g/dL (ref 30.0–36.0)
MCV: 91.4 fL (ref 80.0–100.0)
Platelets: 433 10*3/uL — ABNORMAL HIGH (ref 150–400)
RBC: 4.21 MIL/uL — ABNORMAL LOW (ref 4.22–5.81)
RDW: 14.6 % (ref 11.5–15.5)
WBC: 11.9 10*3/uL — ABNORMAL HIGH (ref 4.0–10.5)
nRBC: 0 % (ref 0.0–0.2)

## 2018-09-07 LAB — BASIC METABOLIC PANEL WITH GFR
Anion gap: 10 (ref 5–15)
BUN: 20 mg/dL (ref 8–23)
CO2: 25 mmol/L (ref 22–32)
Calcium: 9.5 mg/dL (ref 8.9–10.3)
Chloride: 102 mmol/L (ref 98–111)
Creatinine, Ser: 1.23 mg/dL (ref 0.61–1.24)
GFR calc Af Amer: >60 mL/min (ref >60)
GFR calc non Af Amer: 55 mL/min — ABNORMAL LOW (ref >60)
Glucose, Bld: 104 mg/dL — ABNORMAL HIGH (ref 70–99)
Potassium: 4.4 mmol/L (ref 3.5–5.1)
Sodium: 137 mmol/L (ref 135–145)

## 2018-09-07 MED ORDER — LEVOFLOXACIN 750 MG PO TABS: 750.0000 mg | ORAL_TABLET | ORAL | Status: DC

## 2018-09-07 MED ORDER — LEVOFLOXACIN 750 MG PO TABS: 750.0000 mg | ORAL_TABLET | ORAL | 0 refills | Status: DC

## 2018-09-07 NOTE — Progress Notes (Signed)
Inner canula of trach cleaned this morning.   IV removed and discharge instructions reviewed.  Wife to drive home

## 2018-09-07 NOTE — Discharge Summary (Signed)
Physician Discharge Summary  Kenneth [name] NFA:[id number] DOB: Sep 22, 1940 DOA: 09/05/2018  PCP: [name], MD  Admit date: 09/05/2018 Discharge date: 09/07/2018  Admitted From: Home Disposition:  Home   Recommendations for Outpatient Follow-up:  1. Follow up with PCP in 1-2 weeks 2. 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  The patient went to see his primary care provider on 08/30/2018.  The patient was placed on doxycycline.  He had no improvement of his symptoms.  The patient has been having fevers up to 101.6 F.  He continued to have fevers on doxycycline.  He had continued dyspnea on exertion and coughing.  As result, he presented for further evaluation.  Upon presentation, WBC was 16.0.  Chest x-ray showed bilateral interstitial opacities, recurrent left.  The patient was started on levofloxacin.  The pt improved clinically.  He defervesced and WBC improved to 11.9 on day of d/c.   Discharge Diagnoses:  Interstitial pneumonia -Continue levofloxacin--d/c home to finish course on 09/11/18 which will finish 7 day course (levoflox 750 mg q 48 hours--next dose on 09/08/18) -continue duonebs -Personally reviewed chest x-ray--increased interstitial markings, recurrent left.  No edema -flu PCR--neg -viral respiratory panel--neg -no fever >24 hours prior to dc  Acute on chronic respiratory failure with hypoxia -The patient is nonhumidified oxygen/lubrication 2 hours daily;  otherwise he is on room air -Secondary to pneumonia -Wean oxygen as tolerated -ambulated pt-->did not desaturate <88%  Laryngeal carcinoma/vocal cord paralysis/glottic stenosis -Status post tracheostomy August 2019 -Trach last changed 07/11/2018 -Follow[facility] at [facility]  Essential hypertension -Continue amlodipine, metoprolol tartrate, spironolactone -controlled  Right carotid stenosis -Status post carotid stent 05/04/2018 -Continue aspirin and Plavix  History of ischemic stroke April 2019 -Continue aspirin and Plavix -Continue Zetia  Coronary artery disease -No chest pain presently -Personally reviewed EKG--sinus rhythm, right bundle branch block    Discharge Instructions   Allergies as of 09/07/2018      Reactions   Nifedipine Other (See Comments)   Lorazepam Other (See Comments)   REACTION: Alters mental status. "Turns into maniac" REACTION: Alters mental status. "Turns into maniac" REACTION: Alters mental status. "Turns into maniac"   Other    REACTION: Unknown to patient. States that MD states allergy per medical records   Penicillins Hives   Has patient had a PCN reaction causing immediate rash, facial/tongue/throat swelling, SOB or lightheadedness with hypotension: Yes Has patient had a PCN reaction causing severe rash involving mucus membranes or skin necrosis: No Has patient had a PCN reaction that required hospitalization No Has patient had a PCN reaction occurring within the last 10 years: No If all of the above answers are "NO", then may proceed with Cephalosporin use. Has patient had a PCN reaction causing immediate rash, facial/tongue/throat swelling, SOB or lightheadedness with hypotension: Yes Has patient had a PCN reaction causing severe rash involving mucus membranes or skin necrosis: No Has patient had a PCN reaction that required hospitalization No Has patient had a PCN reaction occurring within the last 10 years: No If all of the above  answers are "NO", then may proceed with Cephalosporin use.   Sulfacetamide    REACTION: Unknown to patient. States that MD states allergy per medical records   Sulfonamide Derivatives Other (See Comments)   REACTION: Unknown to patient. States that MD states allergy per medical records   Erythromycin Rash   Statins Itching, Rash      Medication List    TAKE these medications   acetaminophen 500 MG tablet Commonly known as:  TYLENOL Take 1,000 mg by mouth once as needed for mild pain or fever.   amLODipine 10 MG tablet Commonly known as:  NORVASC Take 1 tablet (10 mg total) by mouth daily.   aspirin 325 MG tablet Take 1 tablet (325 mg total) by mouth daily.   clopidogrel 75 MG tablet Commonly known as:  PLAVIX Take 1 tablet (75 mg total) by mouth daily.   ezetimibe 10 MG tablet Commonly known as:  ZETIA Take 1 tablet (10 mg total) by mouth daily.   levofloxacin 750 MG tablet Commonly known as:  LEVAQUIN Take 1 tablet (750 mg total) by mouth every other day. Start 09/08/18 Start taking on:  September 08, 2018   metoprolol tartrate 25 MG tablet Commonly known as:  LOPRESSOR Take 25 mg by mouth 2 (two) times daily.   nitroGLYCERIN 0.4 MG SL tablet Commonly known as:  NITROSTAT Place 1 tablet (0.4 mg total) under the tongue every 5 (five) minutes as needed for chest pain.   omeprazole 20 MG capsule Commonly known as:  PRILOSEC Take 20 mg every evening by mouth.   PARoxetine 20 MG tablet Commonly known as:  PAXIL Take 20 mg by mouth at bedtime.   spironolactone 25 MG tablet Commonly known as:  ALDACTONE Take 25 mg every evening by mouth.       Allergies  Allergen Reactions  . Nifedipine Other (See Comments)  . Lorazepam Other (See Comments)    REACTION: Alters mental status. "Turns into maniac" REACTION: Alters mental status. "Turns into maniac" REACTION: Alters mental status. "Turns into maniac"  . Other     REACTION: Unknown to patient. States that MD  states allergy per medical records  . Penicillins Hives    Has patient had a PCN reaction causing immediate rash, facial/tongue/throat swelling, SOB or lightheadedness with hypotension: Yes Has patient had a PCN reaction causing severe rash involving mucus membranes or skin necrosis: No Has patient had a PCN reaction that required hospitalization No Has patient had a PCN reaction occurring within the last 10 years: No If all of the above answers are "NO", then may proceed with Cephalosporin use.  Has patient had a PCN reaction causing immediate rash, facial/tongue/throat swelling, SOB or lightheadedness with hypotension: Yes Has patient had a PCN reaction causing severe rash involving mucus membranes or skin necrosis: No Has patient had a PCN reaction that required hospitalization No Has patient had a PCN reaction occurring within the last 10 years: No If all of the above answers are "NO", then may proceed with Cephalosporin use.  . Sulfacetamide     REACTION: Unknown to patient. States that MD states allergy per medical records  . Sulfonamide Derivatives Other (See Comments)    REACTION: Unknown to patient. States that MD states allergy per medical records  . Erythromycin Rash  . Statins Itching and Rash    Consultations:  none   Procedures/Studies: Dg Chest 2 View  Result Date: 09/05/2018 CLINICAL DATA:  Patient has a trach and has had fever x 1  week.--- PCP advised to come to ER-- coughing up yellow/green sputum through trach. sob on exertion, hx throat ca EXAM: CHEST - 2 VIEW COMPARISON:  06/10/2018 FINDINGS: Heart size is normal. Status post median sternotomy and CABG. Elevation of the RIGHT hemidiaphragm is stable. Interval development of significant but patchy interstitial opacities bilaterally, RIGHT greater than LEFT. No frank consolidations. No pleural effusions or edema. Degenerative changes are seen in thoracic spine. IMPRESSION: Interval development of significant  interstitial opacities bilaterally, RIGHT greater than LEFT. Findings are consistent with pneumonitis. Electronically Signed   By: Nolon Nations M.D.   On: 09/05/2018 15:57         Discharge Exam: Vitals:   09/06/18 2055 09/07/18 0508  BP: (!) 145/69 124/70  Pulse: 69 (!) 55  Resp: 18 20  Temp: 98.4 F (36.9 C) 97.9 F (36.6 C)  SpO2: 94% 97%   Vitals:   09/06/18 1540 09/06/18 1953 09/06/18 2055 09/07/18 0508  BP:   (!) 145/69 124/70  Pulse:   69 (!) 55  Resp:   18 20  Temp:   98.4 F (36.9 C) 97.9 F (36.6 C)  TempSrc:   Oral Oral  SpO2: 92% 92% 94% 97%  Weight:      Height:        General: Pt is alert, awake, not in acute distress Cardiovascular: RRR, S1/S2 +, no rubs, no gallops Respiratory: bibasilar rales, no wheeze Abdominal: Soft, NT, ND, bowel sounds + Extremities: no edema, no cyanosis   The results of significant diagnostics from this hospitalization (including imaging, microbiology, ancillary and laboratory) are listed below for reference.    Significant Diagnostic Studies: Dg Chest 2 View  Result Date: 09/05/2018 CLINICAL DATA:  Patient has a trach and has had fever x 1 week.--- PCP advised to come to ER-- coughing up yellow/green sputum through trach. sob on exertion, hx throat ca EXAM: CHEST - 2 VIEW COMPARISON:  06/10/2018 FINDINGS: Heart size is normal. Status post median sternotomy and CABG. Elevation of the RIGHT hemidiaphragm is stable. Interval development of significant but patchy interstitial opacities bilaterally, RIGHT greater than LEFT. No frank consolidations. No pleural effusions or edema. Degenerative changes are seen in thoracic spine. IMPRESSION: Interval development of significant interstitial opacities bilaterally, RIGHT greater than LEFT. Findings are consistent with pneumonitis. Electronically Signed   By: Nolon Nations M.D.   On: 09/05/2018 15:57     Microbiology: Recent Results (from the past 240 hour(s))  Blood culture  (routine x 2)     Status: None (Preliminary result)   Collection Time: 09/05/18  5:24 PM  Result Value Ref Range Status   Specimen Description RIGHT ANTECUBITAL  Final   Special Requests   Final    BOTTLES DRAWN AEROBIC AND ANAEROBIC Blood Culture adequate volume   Culture   Final    NO GROWTH 2 DAYS Performed at [facility], [address] [place], [place] [address]    Report Status PENDING  Incomplete  Blood culture (routine x 2)     Status: None (Preliminary result)   Collection Time: 09/05/18  5:43 PM  Result Value Ref Range Status   Specimen Description BLOOD RIGHT ANTECUBITAL  Final   Special Requests   Final    BOTTLES DRAWN AEROBIC AND ANAEROBIC Blood Culture adequate volume   Culture   Final    NO GROWTH 2 DAYS Performed at [facility], [address] [place], [place] [address]    Report Status PENDING  Incomplete  Respiratory Panel by  PCR     Status: None   Collection Time: 09/06/18  8:48 AM  Result Value Ref Range Status   Adenovirus NOT DETECTED NOT DETECTED Final   Coronavirus 229E NOT DETECTED NOT DETECTED Final    Comment: (NOTE) The Coronavirus on the Respiratory Panel, DOES NOT test for the novel  Coronavirus (2019 nCoV)    Coronavirus HKU1 NOT DETECTED NOT DETECTED Final   Coronavirus NL63 NOT DETECTED NOT DETECTED Final   Coronavirus OC43 NOT DETECTED NOT DETECTED Final   Metapneumovirus NOT DETECTED NOT DETECTED Final   Rhinovirus / Enterovirus NOT DETECTED NOT DETECTED Final   Influenza A NOT DETECTED NOT DETECTED Final   Influenza B NOT DETECTED NOT DETECTED Final   Parainfluenza Virus 1 NOT DETECTED NOT DETECTED Final   Parainfluenza Virus 2 NOT DETECTED NOT DETECTED Final   Parainfluenza Virus 3 NOT DETECTED NOT DETECTED Final   Parainfluenza Virus 4 NOT DETECTED NOT DETECTED Final   Respiratory Syncytial Virus NOT DETECTED NOT DETECTED Final   Bordetella pertussis NOT DETECTED NOT DETECTED Final   Chlamydophila pneumoniae NOT DETECTED NOT  DETECTED Final   Mycoplasma pneumoniae NOT DETECTED NOT DETECTED Final    Comment: Performed at [facility], [place] [address], [place], [place] [address]     Labs: Basic Metabolic Panel: Recent Labs  Lab 09/05/18 1556 09/07/18 0449  NA 137 137  K 3.8 4.4  CL 99 102  CO2 25 25  GLUCOSE 91 104*  BUN 20 20  CREATININE 1.25* 1.23  CALCIUM 9.6 9.5   Liver Function Tests: No results for input(s): AST, ALT, ALKPHOS, BILITOT, PROT, ALBUMIN in the last 168 hours. No results for input(s): LIPASE, AMYLASE in the last 168 hours. No results for input(s): AMMONIA in the last 168 hours. CBC: Recent Labs  Lab 09/05/18 1556 09/06/18 0436 09/07/18 0449  WBC 16.0* 12.4* 11.9*  NEUTROABS 11.8*  --   --   HGB 13.4 11.5* 12.0*  HCT 42.2 37.0* 38.5*  MCV 91.3 90.9 91.4  PLT 470* 411* 433*   Cardiac Enzymes: Recent Labs  Lab 09/05/18 1731  TROPONINI <0.03   BNP: Invalid input(s): POCBNP CBG: No results for input(s): GLUCAP in the last 168 hours.  Time coordinating discharge:  36 minutes  Signed:  Orson Eva, DO [facility] Pager: [phone] 09/07/2018, 10:11 AM

## 2018-09-09 DIAGNOSIS — Z8521 Personal history of malignant neoplasm of larynx: Secondary | ICD-10-CM | POA: Diagnosis not present

## 2018-09-09 DIAGNOSIS — J398 Other specified diseases of upper respiratory tract: Secondary | ICD-10-CM | POA: Diagnosis not present

## 2018-09-09 DIAGNOSIS — Z930 Tracheostomy status: Secondary | ICD-10-CM | POA: Diagnosis not present

## 2018-09-10 LAB — CULTURE, BLOOD (ROUTINE X 2)
Culture: NO GROWTH
Culture: NO GROWTH
Special Requests: ADEQUATE
Special Requests: ADEQUATE

## 2018-09-13 DIAGNOSIS — J9601 Acute respiratory failure with hypoxia: Secondary | ICD-10-CM | POA: Diagnosis not present

## 2018-09-13 DIAGNOSIS — J189 Pneumonia, unspecified organism: Secondary | ICD-10-CM | POA: Diagnosis not present

## 2018-09-13 DIAGNOSIS — Z930 Tracheostomy status: Secondary | ICD-10-CM | POA: Diagnosis not present

## 2018-09-20 DIAGNOSIS — Z8521 Personal history of malignant neoplasm of larynx: Secondary | ICD-10-CM | POA: Diagnosis not present

## 2018-09-20 DIAGNOSIS — Z930 Tracheostomy status: Secondary | ICD-10-CM | POA: Diagnosis not present

## 2018-09-20 DIAGNOSIS — J398 Other specified diseases of upper respiratory tract: Secondary | ICD-10-CM | POA: Diagnosis not present

## 2018-09-25 ENCOUNTER — Other Ambulatory Visit (HOSPITAL_COMMUNITY): Payer: Self-pay

## 2018-09-25 ENCOUNTER — Emergency Department (HOSPITAL_COMMUNITY): Payer: Medicare HMO

## 2018-09-25 ENCOUNTER — Other Ambulatory Visit: Payer: Self-pay

## 2018-09-25 ENCOUNTER — Encounter (HOSPITAL_COMMUNITY): Payer: Self-pay | Admitting: Emergency Medicine

## 2018-09-25 ENCOUNTER — Inpatient Hospital Stay (HOSPITAL_COMMUNITY)
Admission: EM | Admit: 2018-09-25 | Discharge: 2018-09-29 | DRG: 871 | Disposition: A | Discharge status: Home Health | Payer: Medicare HMO | Source: Ambulatory Visit | Attending: Family Medicine | Admitting: Family Medicine

## 2018-09-25 DIAGNOSIS — R918 Other nonspecific abnormal finding of lung field: Secondary | ICD-10-CM | POA: Diagnosis not present

## 2018-09-25 DIAGNOSIS — J189 Pneumonia, unspecified organism: Secondary | ICD-10-CM | POA: Diagnosis not present

## 2018-09-25 DIAGNOSIS — Z9002 Acquired absence of larynx: Secondary | ICD-10-CM

## 2018-09-25 DIAGNOSIS — E781 Pure hyperglyceridemia: Secondary | ICD-10-CM | POA: Diagnosis present

## 2018-09-25 DIAGNOSIS — Z95828 Presence of other vascular implants and grafts: Secondary | ICD-10-CM

## 2018-09-25 DIAGNOSIS — I2510 Atherosclerotic heart disease of native coronary artery without angina pectoris: Secondary | ICD-10-CM | POA: Diagnosis not present

## 2018-09-25 DIAGNOSIS — Z8249 Family history of ischemic heart disease and other diseases of the circulatory system: Secondary | ICD-10-CM

## 2018-09-25 DIAGNOSIS — Z8701 Personal history of pneumonia (recurrent): Secondary | ICD-10-CM

## 2018-09-25 DIAGNOSIS — Z8673 Personal history of transient ischemic attack (TIA), and cerebral infarction without residual deficits: Secondary | ICD-10-CM | POA: Diagnosis not present

## 2018-09-25 DIAGNOSIS — Z880 Allergy status to penicillin: Secondary | ICD-10-CM

## 2018-09-25 DIAGNOSIS — Y95 Nosocomial condition: Secondary | ICD-10-CM | POA: Diagnosis not present

## 2018-09-25 DIAGNOSIS — Z79899 Other long term (current) drug therapy: Secondary | ICD-10-CM

## 2018-09-25 DIAGNOSIS — T3695XA Adverse effect of unspecified systemic antibiotic, initial encounter: Secondary | ICD-10-CM

## 2018-09-25 DIAGNOSIS — A419 Sepsis, unspecified organism: Principal | ICD-10-CM | POA: Diagnosis present

## 2018-09-25 DIAGNOSIS — Z9049 Acquired absence of other specified parts of digestive tract: Secondary | ICD-10-CM

## 2018-09-25 DIAGNOSIS — Z8521 Personal history of malignant neoplasm of larynx: Secondary | ICD-10-CM | POA: Diagnosis not present

## 2018-09-25 DIAGNOSIS — I10 Essential (primary) hypertension: Secondary | ICD-10-CM | POA: Diagnosis present

## 2018-09-25 DIAGNOSIS — K219 Gastro-esophageal reflux disease without esophagitis: Secondary | ICD-10-CM | POA: Diagnosis present

## 2018-09-25 DIAGNOSIS — Z8349 Family history of other endocrine, nutritional and metabolic diseases: Secondary | ICD-10-CM | POA: Diagnosis not present

## 2018-09-25 DIAGNOSIS — Z930 Tracheostomy status: Secondary | ICD-10-CM

## 2018-09-25 DIAGNOSIS — Z888 Allergy status to other drugs, medicaments and biological substances status: Secondary | ICD-10-CM

## 2018-09-25 DIAGNOSIS — R0602 Shortness of breath: Secondary | ICD-10-CM

## 2018-09-25 DIAGNOSIS — F329 Major depressive disorder, single episode, unspecified: Secondary | ICD-10-CM | POA: Diagnosis present

## 2018-09-25 DIAGNOSIS — G8929 Other chronic pain: Secondary | ICD-10-CM | POA: Diagnosis present

## 2018-09-25 DIAGNOSIS — Z87442 Personal history of urinary calculi: Secondary | ICD-10-CM

## 2018-09-25 DIAGNOSIS — Z7902 Long term (current) use of antithrombotics/antiplatelets: Secondary | ICD-10-CM

## 2018-09-25 DIAGNOSIS — Z7982 Long term (current) use of aspirin: Secondary | ICD-10-CM

## 2018-09-25 DIAGNOSIS — Z951 Presence of aortocoronary bypass graft: Secondary | ICD-10-CM

## 2018-09-25 DIAGNOSIS — I739 Peripheral vascular disease, unspecified: Secondary | ICD-10-CM | POA: Diagnosis present

## 2018-09-25 DIAGNOSIS — J181 Lobar pneumonia, unspecified organism: Secondary | ICD-10-CM

## 2018-09-25 DIAGNOSIS — Z8709 Personal history of other diseases of the respiratory system: Secondary | ICD-10-CM

## 2018-09-25 DIAGNOSIS — M545 Low back pain: Secondary | ICD-10-CM | POA: Diagnosis present

## 2018-09-25 DIAGNOSIS — Z87891 Personal history of nicotine dependence: Secondary | ICD-10-CM

## 2018-09-25 DIAGNOSIS — Z882 Allergy status to sulfonamides status: Secondary | ICD-10-CM

## 2018-09-25 DIAGNOSIS — E785 Hyperlipidemia, unspecified: Secondary | ICD-10-CM | POA: Diagnosis present

## 2018-09-25 DIAGNOSIS — Z818 Family history of other mental and behavioral disorders: Secondary | ICD-10-CM

## 2018-09-25 DIAGNOSIS — Z881 Allergy status to other antibiotic agents status: Secondary | ICD-10-CM

## 2018-09-25 DIAGNOSIS — T7840XA Allergy, unspecified, initial encounter: Secondary | ICD-10-CM | POA: Diagnosis not present

## 2018-09-25 DIAGNOSIS — R69 Illness, unspecified: Secondary | ICD-10-CM | POA: Diagnosis not present

## 2018-09-25 LAB — CBC WITH DIFFERENTIAL/PLATELET
Abs Immature Granulocytes: 0.13 10*3/uL — ABNORMAL HIGH (ref 0.00–0.07)
Basophils Absolute: 0.1 10*3/uL (ref 0.0–0.1)
Basophils Relative: 0 %
Eosinophils Absolute: 0.1 10*3/uL (ref 0.0–0.5)
Eosinophils Relative: 1 %
HCT: 41.1 % (ref 39.0–52.0)
Hemoglobin: 12.8 g/dL — ABNORMAL LOW (ref 13.0–17.0)
Immature Granulocytes: 1 %
Lymphocytes Relative: 6 %
Lymphs Abs: 1.4 10*3/uL (ref 0.7–4.0)
MCH: 27.8 pg (ref 26.0–34.0)
MCHC: 31.1 g/dL (ref 30.0–36.0)
MCV: 89.2 fL (ref 80.0–100.0)
Monocytes Absolute: 1.4 10*3/uL — ABNORMAL HIGH (ref 0.1–1.0)
Monocytes Relative: 6 %
Neutro Abs: 20.9 10*3/uL — ABNORMAL HIGH (ref 1.7–7.7)
Neutrophils Relative %: 86 %
Platelets: 411 10*3/uL — ABNORMAL HIGH (ref 150–400)
RBC: 4.61 MIL/uL (ref 4.22–5.81)
RDW: 14.6 % (ref 11.5–15.5)
WBC: 24 10*3/uL — ABNORMAL HIGH (ref 4.0–10.5)
nRBC: 0 % (ref 0.0–0.2)

## 2018-09-25 LAB — COMPREHENSIVE METABOLIC PANEL
ALT: 15 U/L (ref 0–44)
AST: 14 U/L — ABNORMAL LOW (ref 15–41)
Albumin: 3.4 g/dL — ABNORMAL LOW (ref 3.5–5.0)
Alkaline Phosphatase: 109 U/L (ref 38–126)
Anion gap: 10 (ref 5–15)
BUN: 21 mg/dL (ref 8–23)
CO2: 24 mmol/L (ref 22–32)
Calcium: 9.4 mg/dL (ref 8.9–10.3)
Chloride: 101 mmol/L (ref 98–111)
Creatinine, Ser: 1.33 mg/dL — ABNORMAL HIGH (ref 0.61–1.24)
GFR calc Af Amer: 59 mL/min — ABNORMAL LOW (ref >60)
GFR calc non Af Amer: 50 mL/min — ABNORMAL LOW (ref >60)
Glucose, Bld: 90 mg/dL (ref 70–99)
Potassium: 4.6 mmol/L (ref 3.5–5.1)
Sodium: 135 mmol/L (ref 135–145)
Total Bilirubin: 0.5 mg/dL (ref 0.3–1.2)
Total Protein: 7.5 g/dL (ref 6.5–8.1)

## 2018-09-25 LAB — URINALYSIS, ROUTINE W REFLEX MICROSCOPIC
Bilirubin Urine: NEGATIVE
Glucose, UA: NEGATIVE mg/dL
Hgb urine dipstick: NEGATIVE
Ketones, ur: NEGATIVE mg/dL
Leukocytes,Ua: NEGATIVE
Nitrite: NEGATIVE
Protein, ur: NEGATIVE mg/dL
Specific Gravity, Urine: 1.01 (ref 1.005–1.030)
pH: 7 (ref 5.0–8.0)

## 2018-09-25 LAB — LACTIC ACID, PLASMA
Lactic Acid, Venous: 1.2 mmol/L (ref 0.5–1.9)
Lactic Acid, Venous: 1.8 mmol/L (ref 0.5–1.9)

## 2018-09-25 MED ORDER — ONDANSETRON HCL 4 MG/2ML IJ SOLN: 4.0000 mg | Freq: Four times a day (QID) | INTRAMUSCULAR | Status: DC | PRN

## 2018-09-25 MED ORDER — SODIUM CHLORIDE 0.9 % IV SOLN
1000.0000 mL | INTRAVENOUS | Status: DC
  Administered 2018-09-25: 1000 mL via INTRAVENOUS

## 2018-09-25 MED ORDER — SODIUM CHLORIDE 0.9 % IV BOLUS
1000.0000 mL | Freq: Once | INTRAVENOUS | Status: AC
  Administered 2018-09-25: 1000 mL via INTRAVENOUS

## 2018-09-25 MED ORDER — ACETAMINOPHEN 325 MG PO TABS
650.0000 mg | ORAL_TABLET | Freq: Four times a day (QID) | ORAL | Status: DC | PRN
  Administered 2018-09-25: 650 mg via ORAL
  Filled 2018-09-25: qty 2

## 2018-09-25 MED ORDER — ACETAMINOPHEN 650 MG RE SUPP: 650.0000 mg | Freq: Four times a day (QID) | RECTAL | Status: DC | PRN

## 2018-09-25 MED ORDER — POLYETHYLENE GLYCOL 3350 17 G PO PACK: 17.0000 g | PACK | Freq: Every day | ORAL | Status: DC | PRN

## 2018-09-25 MED ORDER — AMLODIPINE BESYLATE 5 MG PO TABS
10.0000 mg | ORAL_TABLET | Freq: Every morning | ORAL | Status: DC
  Administered 2018-09-26 – 2018-09-29 (×4): 10 mg via ORAL
  Filled 2018-09-25 (×4): qty 2

## 2018-09-25 MED ORDER — ASPIRIN 325 MG PO TABS
325.0000 mg | ORAL_TABLET | Freq: Every day | ORAL | Status: DC
  Administered 2018-09-25 – 2018-09-29 (×5): 325 mg via ORAL
  Filled 2018-09-25 (×5): qty 1

## 2018-09-25 MED ORDER — GUAIFENESIN-DM 100-10 MG/5ML PO SYRP
10.0000 mL | ORAL_SOLUTION | Freq: Three times a day (TID) | ORAL | Status: AC
  Administered 2018-09-26 – 2018-09-27 (×3): 10 mL via ORAL
  Filled 2018-09-25 (×5): qty 10

## 2018-09-25 MED ORDER — VANCOMYCIN HCL 10 G IV SOLR
1250.0000 mg | INTRAVENOUS | Status: DC
  Filled 2018-09-25: qty 1250

## 2018-09-25 MED ORDER — LINEZOLID 600 MG/300ML IV SOLN
600.0000 mg | Freq: Two times a day (BID) | INTRAVENOUS | Status: DC
  Administered 2018-09-25 – 2018-09-28 (×6): 600 mg via INTRAVENOUS
  Filled 2018-09-25 (×9): qty 300

## 2018-09-25 MED ORDER — PANTOPRAZOLE SODIUM 40 MG PO TBEC
40.0000 mg | DELAYED_RELEASE_TABLET | Freq: Every day | ORAL | Status: DC
  Administered 2018-09-26 – 2018-09-29 (×4): 40 mg via ORAL
  Filled 2018-09-25 (×4): qty 1

## 2018-09-25 MED ORDER — ONDANSETRON HCL 4 MG PO TABS: 4.0000 mg | ORAL_TABLET | Freq: Four times a day (QID) | ORAL | Status: DC | PRN

## 2018-09-25 MED ORDER — VANCOMYCIN HCL IN DEXTROSE 1-5 GM/200ML-% IV SOLN
1000.0000 mg | Freq: Once | INTRAVENOUS | Status: AC
  Administered 2018-09-25: 1000 mg via INTRAVENOUS
  Filled 2018-09-25: qty 200

## 2018-09-25 MED ORDER — IOHEXOL 300 MG/ML  SOLN
75.0000 mL | Freq: Once | INTRAMUSCULAR | Status: AC | PRN
  Administered 2018-09-25: 75 mL via INTRAVENOUS

## 2018-09-25 MED ORDER — EZETIMIBE 10 MG PO TABS
10.0000 mg | ORAL_TABLET | Freq: Every morning | ORAL | Status: DC
  Administered 2018-09-26 – 2018-09-29 (×4): 10 mg via ORAL
  Filled 2018-09-25 (×4): qty 1

## 2018-09-25 MED ORDER — CLOPIDOGREL BISULFATE 75 MG PO TABS
75.0000 mg | ORAL_TABLET | Freq: Every morning | ORAL | Status: DC
  Administered 2018-09-26 – 2018-09-29 (×4): 75 mg via ORAL
  Filled 2018-09-25 (×5): qty 1

## 2018-09-25 MED ORDER — SODIUM CHLORIDE 0.9 % IV SOLN
INTRAVENOUS | Status: DC
Start: 2018-09-25 — End: 2018-09-25

## 2018-09-25 MED ORDER — SPIRONOLACTONE 25 MG PO TABS
25.0000 mg | ORAL_TABLET | Freq: Every morning | ORAL | Status: DC
  Administered 2018-09-26 – 2018-09-29 (×4): 25 mg via ORAL
  Filled 2018-09-25 (×4): qty 1

## 2018-09-25 MED ORDER — METOPROLOL TARTRATE 25 MG PO TABS
25.0000 mg | ORAL_TABLET | Freq: Every morning | ORAL | Status: DC
  Administered 2018-09-26 – 2018-09-29 (×4): 25 mg via ORAL
  Filled 2018-09-25 (×4): qty 1

## 2018-09-25 MED ORDER — DIPHENHYDRAMINE HCL 50 MG/ML IJ SOLN
12.5000 mg | Freq: Once | INTRAMUSCULAR | Status: AC
  Administered 2018-09-25: 12.5 mg via INTRAVENOUS

## 2018-09-25 MED ORDER — ENOXAPARIN SODIUM 40 MG/0.4ML ~~LOC~~ SOLN
40.0000 mg | SUBCUTANEOUS | Status: DC
  Administered 2018-09-26 – 2018-09-28 (×3): 40 mg via SUBCUTANEOUS
  Filled 2018-09-25 (×4): qty 0.4

## 2018-09-25 MED ORDER — SODIUM CHLORIDE 0.9 % IV SOLN
1.0000 g | Freq: Two times a day (BID) | INTRAVENOUS | Status: AC
  Administered 2018-09-25 – 2018-09-28 (×7): 1 g via INTRAVENOUS
  Filled 2018-09-25 (×9): qty 1

## 2018-09-25 MED ORDER — PAROXETINE HCL 20 MG PO TABS
20.0000 mg | ORAL_TABLET | Freq: Every day | ORAL | Status: DC
  Administered 2018-09-25: 20 mg via ORAL
  Filled 2018-09-25: qty 1

## 2018-09-25 MED ORDER — DIPHENHYDRAMINE HCL 50 MG/ML IJ SOLN
INTRAMUSCULAR | Status: AC
  Filled 2018-09-25: qty 1

## 2018-09-25 MED ORDER — SODIUM CHLORIDE 0.9 % IV SOLN
INTRAVENOUS | Status: AC
  Administered 2018-09-25 – 2018-09-26 (×2): via INTRAVENOUS

## 2018-09-25 NOTE — ED Notes (Signed)
Per MD Eulis Foster stop Vancomycin for a suspected allergic reaction. Pt c/o flushed face and feeling hot. NAD noted or airway disturbance.

## 2018-09-25 NOTE — ED Notes (Signed)
Respiratory paged as pt is requesting suctioning.

## 2018-09-25 NOTE — ED Triage Notes (Signed)
PT was sent to ED by Dr. Willey Blade (PCP) today for c/o sob on exertion, generalized weakness, low grade fever, cough x4 days. PT has trach in place and denies any oxygen at home but states has recurrent pneumonia. PT had 2 tylenol tablets around 3 hours prior to ED arrival due to fever of 101.0 at home.

## 2018-09-25 NOTE — ED Provider Notes (Addendum)
[facility] EMERGENCY DEPARTMENT Provider Note   CSN: [id number] Arrival date & time: 09/25/18  1253    History   Chief Complaint Chief Complaint  Patient presents with  . Shortness of Breath    HPI Kenneth [name] is a 78 y.o. male.     HPI   He presents for evaluation of fever, cough, increased tracheal mucus and generalized weakness.  Gradual onset of symptoms over the last several days.  Hospitalized 3 weeks ago, completed antibiotics for pneumonia, 2 weeks ago.  He lives with his wife.  No known sick contacts.  He has been able to continue doing things around the house, including caring for self, and assisting with his activities of daily living.  He saw his PCP this morning, directed him here for further evaluation.  There are no other known modifying factors.  Past Medical History:  Diagnosis Date  . Anxiety   . 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 ASCVD (arteriosclerotic cardiovascular disease)   . Chronic lower back pain    "have it at night" (05/04/2018)  . Coronary artery disease    a. 2003: s/p CABG x3V  b. 2007: cath with patent bypass grafts.  c. 09/2016: [place] with cath showing patent bypass grafts, medical Rx recommended  . CVA (cerebral vascular accident) ([place]) 10/2017   "little numb on my left face since" (05/04/2018)  . Depression   . GERD (gastroesophageal reflux disease)   . High triglycerides   . History of kidney stones   . Hypertension   . 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 Malodorous urine    "in the last 5wks; since I had trach put in" (05/04/2018)  . Peripheral vascular disease ([place])   . Stroke [facility])    "he's had several little strokes; many that he wasn't aware of" (05/04/2018)  . Tobacco abuse   . Tracheal stenosis     Patient Active Problem List   Diagnosis Date Noted  . Acute on chronic respiratory failure with hypoxia ([place]) 09/06/2018  . 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 Pneumonia due to infectious organism   . SOB (shortness  of breath) 09/05/2018  . Carotid stenosis, symptomatic, with infarction ([place]) 05/04/2018  . Ischemic stroke ([place]) 11/15/2017  . TIA (transient ischemic attack) 11/14/2017  . Constipation 09/15/2017  . Chest pain 10/06/2016  . Essential hypertension 10/06/2016  . 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 Dyspnea 12/05/2015  . 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 Aftercare following surgery of the circulatory system, [place] 08/03/2013  . Occlusion and stenosis of carotid artery without mention of cerebral infarction 07/21/2012  . Preop cardiovascular exam 05/08/2012  . Carotid stenosis, bilateral 05/05/2012  . CAD in native artery 08/13/2011  . Malignant neoplasm of larynx ([place]) 12/14/2009  . HLD (hyperlipidemia) 12/14/2009  . CEREBROVASCULAR ACCIDENT 12/14/2009  . GASTROESOPHAGEAL REFLUX DISEASE 12/14/2009  . 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 CAROTID STENT Right 06-13-12; 05/04/2018  . CAROTID STENT INSERTION N/A 06/13/2012   Procedure: CAROTID STENT INSERTION;  Surgeon: Serafina Mitchell, MD;  Location: [facility] CATH LAB;  Service: Cardiovascular;  Laterality: N/A;  . CATARACT EXTRACTION, BILATERAL Bilateral   . COLONOSCOPY  11/10/2011   Dr. [name]: hemorrhoids, tubular adenoma  . CORNEAL TRANSPLANT Bilateral   . CORONARY ARTERY BYPASS GRAFT  ~ 2003   "CABG X3"  . EYE SURGERY    . INSERTION OF RETROGRADE CAROTID STENT Right 05/04/2018   Procedure: INSERTION OF RIGHT  CAROTID  STENT using an ABBOT- XACT carotid stent system;  Surgeon: Serafina Mitchell, MD;  Location: [place];  Service: Vascular;  Laterality: Right;  . LAPAROSCOPIC CHOLECYSTECTOMY  2007  . LEFT HEART CATH AND CORS/GRAFTS ANGIOGRAPHY N/A 10/07/2016   Procedure: Left Heart Cath and Cors/Grafts Angiography;  Surgeon: Troy Sine, MD;  Location: [place] CV LAB;  Service: Cardiovascular;  Laterality: N/A;  . PARTIAL LARYNGECTOMY  1998  . TRACHEOSTOMY  03/13/2018   "@ [facility]"        Home  Medications    Prior to Admission medications   Medication Sig Start Date End Date Taking? Authorizing Provider  acetaminophen (TYLENOL) 500 MG tablet Take 1,000 mg by mouth every 8 (eight) hours as needed for mild pain or fever.    Yes [provider]  amLODipine (NORVASC) 10 MG tablet Take 1 tablet (10 mg total) by mouth daily. Patient taking differently: Take 10 mg by mouth every morning.  08/26/17 06/10/26 Yes BranchAlphonse Guild, MD  aspirin 325 MG tablet Take 1 tablet (325 mg total) by mouth daily. 11/16/17  Yes Johnson, Clanford L, MD  clopidogrel (PLAVIX) 75 MG tablet Take 1 tablet (75 mg total) by mouth daily. Patient taking differently: Take 75 mg by mouth every morning.  12/26/17  Yes BranchAlphonse Guild, MD  ezetimibe (ZETIA) 10 MG tablet Take 1 tablet (10 mg total) by mouth daily. Patient taking differently: Take 10 mg by mouth every morning.  09/07/17 06/10/26 Yes Branch, Alphonse Guild, MD  metoprolol tartrate (LOPRESSOR) 25 MG tablet Take 25 mg by mouth every morning.  03/16/17  Yes [provider]  nitroGLYCERIN (NITROSTAT) 0.4 MG SL tablet Place 1 tablet (0.4 mg total) under the tongue every 5 (five) minutes as needed for chest pain. 01/31/17 05/25/21 Yes Lendon Colonel, NP  omeprazole (PRILOSEC) 20 MG capsule Take 20 mg by mouth every morning.    Yes [provider]  PARoxetine (PAXIL) 20 MG tablet Take 20 mg by mouth at bedtime.    Yes [provider]  spironolactone (ALDACTONE) 25 MG tablet Take 25 mg by mouth every morning.    Yes [provider]  levofloxacin (LEVAQUIN) 750 MG tablet Take 1 tablet (750 mg total) by mouth every other day. Start 09/08/18 Patient not taking: Reported on 09/25/2018 09/08/18   Orson Eva, MD    Family History Family History  Problem Relation Age of Onset  . Dementia Mother   . Heart disease Father   . Cancer Brother   . Heart disease Brother   . Hyperlipidemia Brother   . Cancer Brother   . Colon cancer  Neg Hx     Social History Social History   Tobacco Use  . Smoking status: Former Smoker    Packs/day: 1.00    Years: 15.00    Pack years: 15.00    Types: Cigarettes    Start date: 04/22/1953    Last attempt to quit: 07/26/1970    Years since quitting: 48.2  . Smokeless tobacco: Former Systems developer    Types: Chew    Quit date: 05/05/2002  Substance Use Topics  . Alcohol use: Never    Alcohol/week: 0.0 standard drinks    Frequency: Never  . Drug use: Never     Allergies   Nifedipine; Lorazepam; Other; Penicillins; Sulfacetamide; Sulfonamide derivatives; Erythromycin; Statins; and Vancomycin   Review of Systems Review of Systems  All other systems reviewed and are negative.    Physical Exam Updated Vital Signs  BP (!) 141/86   Pulse 74   Temp 98.1 F (36.7 C)   Resp 19   Ht 5\' 5"  (1.651 m)   Wt 81.6 kg   SpO2 93%   BMI 29.95 kg/m   Physical Exam Vitals signs and nursing note reviewed.  Constitutional:      General: He is not in acute distress.    Appearance: He is well-developed. He is not ill-appearing, toxic-appearing or diaphoretic.     Comments: Elderly, frail  HENT:     Head: Normocephalic and atraumatic.     Right Ear: External ear normal.     Left Ear: External ear normal.  Eyes:     Conjunctiva/sclera: Conjunctivae normal.     Pupils: Pupils are equal, round, and reactive to light.  Neck:     Musculoskeletal: Normal range of motion and neck supple.     Trachea: Phonation normal.     Comments: Tracheostomy device, appears normal. Cardiovascular:     Rate and Rhythm: Normal rate and regular rhythm.     Heart sounds: Normal heart sounds.  Pulmonary:     Effort: Pulmonary effort is normal. Tachypnea present. No accessory muscle usage or respiratory distress.     Breath sounds: Examination of the right-middle field reveals rales. Examination of the right-lower field reveals rales. Rales present. No decreased breath sounds, wheezing or rhonchi.  Abdominal:       Palpations: Abdomen is soft.     Tenderness: There is no abdominal tenderness.  Musculoskeletal: Normal range of motion.     Right lower leg: He exhibits no tenderness. No edema.     Left lower leg: He exhibits no tenderness. No edema.  Skin:    General: Skin is warm and dry.  Neurological:     Mental Status: He is alert and oriented to person, place, and time.     Cranial Nerves: No cranial nerve deficit.     Sensory: No sensory deficit.     Motor: No abnormal muscle tone.     Coordination: Coordination normal.  Psychiatric:        Behavior: Behavior normal.        Thought Content: Thought content normal.        Judgment: Judgment normal.      ED Treatments / Results  Labs (all labs ordered are listed, but only abnormal results are displayed) Labs Reviewed  CBC WITH DIFFERENTIAL/PLATELET - Abnormal; Notable for the following components:      Result Value   WBC 24.0 (*)    Hemoglobin 12.8 (*)    Platelets 411 (*)    Neutro Abs 20.9 (*)    Monocytes Absolute 1.4 (*)    Abs Immature Granulocytes 0.13 (*)    All other components within normal limits  COMPREHENSIVE METABOLIC PANEL - Abnormal; Notable for the following components:   Creatinine, Ser 1.33 (*)    Albumin 3.4 (*)    AST 14 (*)    GFR calc non Af Amer 50 (*)    GFR calc Af Amer 59 (*)    All other components within normal limits  CULTURE, BLOOD (ROUTINE X 2)  CULTURE, BLOOD (ROUTINE X 2)  URINE CULTURE  RESPIRATORY PANEL BY PCR  LACTIC ACID, PLASMA  LACTIC ACID, PLASMA  URINALYSIS, ROUTINE W REFLEX MICROSCOPIC    EKG None  Radiology Dg Chest 2 View  Result Date: 09/25/2018 CLINICAL DATA:  PATIENT DISCHARGED FROM [place] 3 WEEKS AGO AND TREATED FOR PNEUMONIA. PATIENT CONTINUES  TO HAVE COUGH, SOB, AND FEVER SINCE DISCHARGE. HX: QUIT SMOKING 39 YEARS AGO, HAS A TRACHEOSTOMY TUBE DUE TO CANCER OF LARYNX, PNEUMONIA, CABG, HTN. EXAM: CHEST - 2 VIEW COMPARISON:  To 06/15/2019 FINDINGS:  Tracheostomy in place. Patient has had median sternotomy and CABG. The heart is normal in size. Significant persistent patchy opacities bilaterally, possibly more confluent at the RIGHT lung base. There is no pleural effusion. No pulmonary edema. IMPRESSION: Persistent bilateral pulmonary infiltrates representing a significant change from studies in late 2019. Consider further evaluation CT of the chest. Electronically Signed   By: Nolon Nations M.D.   On: 09/25/2018 14:29   Ct Chest W Contrast  Result Date: 09/25/2018 CLINICAL DATA:  78 year old male with a history of interstitial lung disease currently with shortness of breath on exertion, generalized weakness, low-grade fever and cough for the past 4 days. EXAM: CT CHEST WITH CONTRAST TECHNIQUE: Multidetector CT imaging of the chest was performed during intravenous contrast administration. CONTRAST:  9mL OMNIPAQUE IOHEXOL 300 MG/ML  SOLN COMPARISON:  Chest x-ray obtained earlier today; prior CT scan of the chest 06/08/2017 FINDINGS: Cardiovascular: Incompletely visualized stent in the right common carotid artery. Patient is status post median sternotomy with evidence of prior multivessel CABG. Borderline cardiomegaly. No pericardial effusion. Conventional 3 vessel arch anatomy. No evidence of aneurysm. Mediastinum/Nodes: Tracheostomy tube. The tip of the tube is identified in the mid trachea. No suspicious mediastinal or hilar adenopathy. The esophagus is unremarkable. Lungs/Pleura: Multifocal patchy airspace opacities noted in the bilateral upper lobes slightly worse on the right than the left. Minimal patchy airspace disease also present within the superior segments of both lower lobes. Mild paraseptal pulmonary emphysema. Diffuse mild bronchial wall thickening. Areas of chronic linear scarring visualized in the right middle lobe and lingula. No pleural effusion or pneumothorax. Upper Abdomen: No acute abnormality. Surgical changes of prior  cholecystectomy. Musculoskeletal: No acute fracture or aggressive appearing lytic or blastic osseous lesion. Healed median sternotomy. IMPRESSION: 1. Multifocal patchy ground-glass attenuation airspace opacities throughout both upper lobes and the superior segments of the lower lobes concerning for a multifocal infectious or inflammatory process. Multifocal pneumonia is favored. Differential consideration should include atypical and viral pneumonia. 2. Aortic Atherosclerosis (ICD10-I70.0) and Emphysema (ICD10-J43.9). 3. Well-positioned tracheostomy tube. Electronically Signed   By: Jacqulynn Cadet M.D.   On: 09/25/2018 17:25    Procedures .Critical Care Performed by: Daleen Bo, MD Authorized by: Daleen Bo, MD   Critical care provider statement:    Critical care time (minutes):  35   Critical care start time:  09/25/2018 4:00 PM   Critical care end time:  09/25/2018 6:32 PM   Critical care time was exclusive of:  Separately billable procedures and treating other patients   Critical care was necessary to treat or prevent imminent or life-threatening deterioration of the following conditions:  Respiratory failure   Critical care was time spent personally by me on the following activities:  Blood draw for specimens, development of treatment plan with patient or surrogate, discussions with consultants, evaluation of patient's response to treatment, examination of patient, obtaining history from patient or surrogate, ordering and performing treatments and interventions, ordering and review of laboratory studies, pulse oximetry, re-evaluation of patient's condition, review of old charts and ordering and review of radiographic studies   (including critical care time)  Medications Ordered in ED Medications  0.9 %  sodium chloride infusion ( Intravenous Rate/Dose Verify 09/25/18 1923)  meropenem (MERREM) 1 g in sodium chloride 0.9 % 100  mL IVPB ( Intravenous Stopped 09/25/18 1829)  vancomycin  (VANCOCIN) 1,250 mg in sodium chloride 0.9 % 250 mL IVPB (has no administration in time range)  0.9 %  sodium chloride infusion ( Intravenous Restarted 09/25/18 1838)  sodium chloride 0.9 % bolus 1,000 mL ( Intravenous Stopped 09/25/18 1705)  vancomycin (VANCOCIN) IVPB 1000 mg/200 mL premix ( Intravenous Stopped 09/25/18 1822)    Followed by  vancomycin (VANCOCIN) IVPB 1000 mg/200 mL premix ( Intravenous Stopped 09/25/18 1912)  iohexol (OMNIPAQUE) 300 MG/ML solution 75 mL (75 mLs Intravenous Contrast Given 09/25/18 1705)  diphenhydrAMINE (BENADRYL) injection 12.5 mg (12.5 mg Intravenous Given 09/25/18 1913)     Initial Impression / Assessment and Plan / ED Course  I have reviewed the triage vital signs and the nursing notes.  Pertinent labs & imaging results that were available during my care of the patient were reviewed by me and considered in my medical decision making (see chart for details).  Clinical Course as of Sep 25 1923  Mon Sep 25, 2018  1828 Normal  Lactic acid, plasma [EW]  1828 Normal except creatinine slightly elevated, GFR low  Comprehensive metabolic panel(!) [EW]  6967 Normal  Urinalysis, Routine w reflex microscopic (not at [facility]) [EW]  1828 Normal except white count high, hemoglobin low, platelets elevated  CBC with Differential(!) [EW]  1829 Consistent with acute infectious process, viral versus bacterial.  CT Chest W Contrast [EW]  1923 I checked on the patient he complained of itching from the chin to the top of his head.  Patient's son states that the patient's face is red.  This time the lungs do not have wheezing.  There is no increased work of breathing.  Vancomycin is being infused and I asked the nurse to stop it.  Benadryl given for allergic reaction.  Patient is now considered allergic to vancomycin with itchiness reaction.   [EW]    Clinical Course User Index [EW] Daleen Bo, MD        Patient Vitals for the past 24 hrs:  BP Temp Temp src Pulse Resp SpO2  Height Weight  09/25/18 1900 (!) 141/86 - - 74 19 93 % - -  09/25/18 1830 (!) 154/74 98.1 F (36.7 C) - 74 17 98 % - -  09/25/18 1730 123/70 - - 76 (!) 24 93 % - -  09/25/18 1700 (!) 145/68 - - 74 (!) 29 95 % - -  09/25/18 1630 136/66 - - 73 (!) 24 91 % - -  09/25/18 1607 - 100.1 F (37.8 C) Rectal - - - - -  09/25/18 1600 (!) 152/83 - - 71 (!) 25 98 % - -  09/25/18 1555 (!) 152/83 - - 70 (!) 29 91 % - -  09/25/18 1329 - - - - - - 5\' 5"  (1.651 m) 81.6 kg  09/25/18 1328 (!) 147/74 99.3 F (37.4 C) Oral 73 20 91 % - -    6:29 PM Reevaluation with update and discussion. After initial assessment and treatment, an updated evaluation reveals no change in clinical status, findings discussed with the patient and all questions were answered. Daleen Bo   Medical Decision Making: Respiratory illness, likely persistent pneumonia, with possibility of viral process.  Empiric antibiotics started, to cover complicated pneumonia with recent hospitalization.  Initial lactate normal.  SIRS negative.  Doubt severe sepsis.  CT chest was ordered to confirm persistent abnormal chest x-ray which was most consistent with infectious process.  No evidence for pulmonary neoplasm,  or unusual pneumonia.  Patient can be managed in an observed setting, on telemetry, pending outcome of initial treatment.  CRITICAL CARE-yes Performed by: Daleen Bo  Nursing Notes Reviewed/ Care Coordinated Applicable Imaging Reviewed Interpretation of Laboratory Data incorporated into ED treatment   6:31 PM-Consult complete with hospitalist. Patient case explained and discussed.  She agrees to admit patient for further evaluation and treatment. Call ended at 6:58 PM  Plan: Admit  Final Clinical Impressions(s) / ED Diagnoses   Final diagnoses:  HCAP (healthcare-associated pneumonia)  Allergic reaction due to antibacterial drug    ED Discharge Orders    None       Daleen Bo, MD 09/25/18 [place]    Daleen Bo, MD 09/25/18 [phone]

## 2018-09-25 NOTE — H&P (Signed)
History and Physical    Kenneth [name] DJM:[id number] DOB: 1940/12/25 DOA: 09/25/2018  PCP: [name], MD   Patient coming from: Home  I have personally briefly reviewed patient's old medical records in [place]  Chief Complaint: SOB, Cough, fevers  HPI: Kenneth [name] is a 78 y.o. male with medical history significant for tracheostomy for laryngeal malignancy, HTN, carotid artery stenosis, CVA who presented to the ED with complaints of difficulty breathing, with cough and fevers over the past 2 days.  T-max of 100.1 recorded. Recent hospitalization 2/11- 2/13-also for pneumonia, with negative influenza and respiratory viral panel.  Patient was started on Levaquin in hospital and discharged home on same.  He reports compliance with the medication.  Son reports some fevers initially on discharge-for about 2 days, but this resolved.  Patient felt clinically better until 2 days ago.  No vomiting, reports 1 or 2 loose stools over the past 2 days, no abdominal pain, no burning with urination.  No neck stiffness or pain.  He denies difficulty or problems with swallowing.  ED Course: Max 100.1, intermittent tachypnea.  O2 sats greater than 91% on room air.  The 24.  Lactic acid 1.2.  Chest CT showed multifocal patchy groundglass attenuation airspace opacities throughout both upper lobes and superior segments of the lower lobes concerning for multifocal infectious or inflammatory process.  Multifocal pneumonia fever.  Differentials include atypical or viral pneumonia.  Patient was started on IV vancomycin and meropenem in the ED.  While vancomycin was infusing patient suddenly started itching his head, developed splotchiness/redness to his forehead and cheeks.  After vancomycin was discontinued symptoms resolved about 30 to 40 minutes later.  Benadryl was given.  Son was present at bedside.   Review of Systems: As per HPI all other systems reviewed and negative.  Past Medical History:    Diagnosis Date  . Anxiety   . 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 ASCVD (arteriosclerotic cardiovascular disease)   . Chronic lower back pain    "have it at night" (05/04/2018)  . Coronary artery disease    a. 2003: s/p CABG x3V  b. 2007: cath with patent bypass grafts.  c. 09/2016: [place] with cath showing patent bypass grafts, medical Rx recommended  . CVA (cerebral vascular accident) ([place]) 10/2017   "little numb on my left face since" (05/04/2018)  . Depression   . GERD (gastroesophageal reflux disease)   . High triglycerides   . History of kidney stones   . Hypertension   . 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 Malodorous urine    "in the last 5wks; since I had trach put in" (05/04/2018)  . Peripheral vascular disease ([place])   . Stroke [facility])    "he's had several little strokes; many that he wasn't aware of" (05/04/2018)  . Tobacco abuse   . Tracheal stenosis     Past Surgical History:  Procedure Laterality Date  . CAROTID STENT Right 06-13-12; 05/04/2018  . CAROTID STENT INSERTION N/A 06/13/2012   Procedure: CAROTID STENT INSERTION;  Surgeon: Serafina Mitchell, MD;  Location: [facility] CATH LAB;  Service: Cardiovascular;  Laterality: N/A;  . CATARACT EXTRACTION, BILATERAL Bilateral   . COLONOSCOPY  11/10/2011   Dr. [name]: hemorrhoids, tubular adenoma  . CORNEAL TRANSPLANT Bilateral   . CORONARY ARTERY BYPASS GRAFT  ~ 2003   "CABG X3"  . EYE SURGERY    . INSERTION OF RETROGRADE CAROTID STENT Right 05/04/2018   Procedure: INSERTION OF RIGHT  CAROTID STENT using an ABBOT- XACT carotid stent system;  Surgeon: Serafina Mitchell, MD;  Location: [place];  Service: Vascular;  Laterality: Right;  . LAPAROSCOPIC CHOLECYSTECTOMY  2007  . LEFT HEART CATH AND CORS/GRAFTS ANGIOGRAPHY N/A 10/07/2016   Procedure: Left Heart Cath and Cors/Grafts Angiography;  Surgeon: Troy Sine, MD;  Location: [place] CV LAB;  Service: Cardiovascular;  Laterality: N/A;  . PARTIAL LARYNGECTOMY  1998  . TRACHEOSTOMY   03/13/2018   "@ [place]"     reports that he quit smoking about 48 years ago. His smoking use included cigarettes. He started smoking about 65 years ago. He has a 15.00 pack-year smoking history. He quit smokeless tobacco use about 16 years ago.  His smokeless tobacco use included chew. He reports that he does not drink alcohol or use drugs.  Allergies  Allergen Reactions  . Nifedipine Other (See Comments)  . Lorazepam Other (See Comments)    REACTION: Alters mental status. "Turns into maniac" REACTION: Alters mental status. "Turns into maniac" REACTION: Alters mental status. "Turns into maniac"  . Other     REACTION: Unknown to patient. States that MD states allergy per medical records  . Penicillins Hives    Has patient had a PCN reaction causing immediate rash, facial/tongue/throat swelling, SOB or lightheadedness with hypotension: Yes Has patient had a PCN reaction causing severe rash involving mucus membranes or skin necrosis: No Has patient had a PCN reaction that required hospitalization No Has patient had a PCN reaction occurring within the last 10 years: No If all of the above answers are "NO", then may proceed with Cephalosporin use.  Has patient had a PCN reaction causing immediate rash, facial/tongue/throat swelling, SOB or lightheadedness with hypotension: Yes Has patient had a PCN reaction causing severe rash involving mucus membranes or skin necrosis: No Has patient had a PCN reaction that required hospitalization No Has patient had a PCN reaction occurring within the last 10 years: No If all of the above answers are "NO", then may proceed with Cephalosporin use.  . Sulfacetamide     REACTION: Unknown to patient. States that MD states allergy per medical records  . Sulfonamide Derivatives Other (See Comments)    REACTION: Unknown to patient. States that MD states allergy per medical records  . Erythromycin Rash  . Statins Itching and Rash  . Vancomycin Itching     Family History  Problem Relation Age of Onset  . Dementia Mother   . Heart disease Father   . Cancer Brother   . Heart disease Brother   . Hyperlipidemia Brother   . Cancer Brother   . Colon cancer Neg Hx     Prior to Admission medications   Medication Sig Start Date End Date Taking? Authorizing Provider  acetaminophen (TYLENOL) 500 MG tablet Take 1,000 mg by mouth every 8 (eight) hours as needed for mild pain or fever.    Yes [provider]  amLODipine (NORVASC) 10 MG tablet Take 1 tablet (10 mg total) by mouth daily. Patient taking differently: Take 10 mg by mouth every morning.  08/26/17 06/10/26 Yes BranchAlphonse Guild, MD  aspirin 325 MG tablet Take 1 tablet (325 mg total) by mouth daily. 11/16/17  Yes Johnson, Clanford L, MD  clopidogrel (PLAVIX) 75 MG tablet Take 1 tablet (75 mg total) by mouth daily. Patient taking differently: Take 75 mg by mouth every morning.  12/26/17  Yes Arnoldo Lenis, MD  ezetimibe (ZETIA) 10 MG tablet Take 1 tablet (  10 mg total) by mouth daily. Patient taking differently: Take 10 mg by mouth every morning.  09/07/17 06/10/26 Yes Branch, Alphonse Guild, MD  metoprolol tartrate (LOPRESSOR) 25 MG tablet Take 25 mg by mouth every morning.  03/16/17  Yes [provider]  nitroGLYCERIN (NITROSTAT) 0.4 MG SL tablet Place 1 tablet (0.4 mg total) under the tongue every 5 (five) minutes as needed for chest pain. 01/31/17 05/25/21 Yes Lendon Colonel, NP  omeprazole (PRILOSEC) 20 MG capsule Take 20 mg by mouth every morning.    Yes [provider]  PARoxetine (PAXIL) 20 MG tablet Take 20 mg by mouth at bedtime.    Yes [provider]  spironolactone (ALDACTONE) 25 MG tablet Take 25 mg by mouth every morning.    Yes [provider]  levofloxacin (LEVAQUIN) 750 MG tablet Take 1 tablet (750 mg total) by mouth every other day. Start 09/08/18 Patient not taking: Reported on 09/25/2018 09/08/18   Orson Eva, MD    Physical  Exam: Vitals:   09/25/18 1700 09/25/18 1730 09/25/18 1830 09/25/18 1900  BP: (!) 145/68 123/70 (!) 154/74 (!) 141/86  Pulse: 74 76 74 74  Resp: (!) 29 (!) 24 17 19   Temp:   98.1 F (36.7 C)   TempSrc:      SpO2: 95% 93% 98% 93%  Weight:      Height:        Constitutional: NAD, calm, comfortable Vitals:   09/25/18 1700 09/25/18 1730 09/25/18 1830 09/25/18 1900  BP: (!) 145/68 123/70 (!) 154/74 (!) 141/86  Pulse: 74 76 74 74  Resp: (!) 29 (!) 24 17 19   Temp:   98.1 F (36.7 C)   TempSrc:      SpO2: 95% 93% 98% 93%  Weight:      Height:       Eyes: PERRL, lids and conjunctivae normal ENMT: Mucous membranes are dry. Posterior pharynx clear of any exudate or lesions.  Neck: normal, supple, no masses, no thyromegaly.  Tracheostomy collar in place. Respiratory: clear to auscultation bilaterally, no wheezing, no crackles. Normal respiratory effort. No accessory muscle use.  Cardiovascular: Regular rate and rhythm, no murmurs / rubs / gallops. No extremity edema. 2+ pedal pulses. No carotid bruits.  Abdomen: no tenderness, no masses palpated. No hepatosplenomegaly. Bowel sounds positive.  Musculoskeletal: no clubbing / cyanosis. No joint deformity upper and lower extremities. Good ROM, no contractures. Normal muscle tone.  Skin: no rashes, lesions, ulcers. No induration Neurologic: CN 2-12 grossly intact.  Strength 5/5 in all 4.  Psychiatric: Normal judgment and insight. Alert and oriented x 3. Normal mood.   Labs on Admission: I have personally reviewed following labs and imaging studies  CBC: Recent Labs  Lab 09/25/18 1349  WBC 24.0*  NEUTROABS 20.9*  HGB 12.8*  HCT 41.1  MCV 89.2  PLT 983*   Basic Metabolic Panel: Recent Labs  Lab 09/25/18 1349  NA 135  K 4.6  CL 101  CO2 24  GLUCOSE 90  BUN 21  CREATININE 1.33*  CALCIUM 9.4   Liver Function Tests: Recent Labs  Lab 09/25/18 1349  AST 14*  ALT 15  ALKPHOS 109  BILITOT 0.5  PROT 7.5  ALBUMIN 3.4*    Urine analysis:    Component Value Date/Time   COLORURINE YELLOW 09/25/2018 [place] 09/25/2018 1705   LABSPEC 1.010 09/25/2018 [place] 7.0 09/25/2018 [place] 09/25/2018 1705   GLUCOSEU neg 06/01/2010  [place] 09/25/2018 [place] 09/25/2018 [place] 09/25/2018 1705   PROTEINUR NEGATIVE 09/25/2018 1705   NITRITE NEGATIVE 09/25/2018 1705   LEUKOCYTESUR NEGATIVE 09/25/2018 1705    Radiological Exams on Admission: Dg Chest 2 View  Result Date: 09/25/2018 CLINICAL DATA:  PATIENT DISCHARGED FROM [facility] 3 WEEKS AGO AND TREATED FOR PNEUMONIA. PATIENT CONTINUES TO HAVE COUGH, SOB, AND FEVER SINCE DISCHARGE. HX: QUIT SMOKING 42 YEARS AGO, HAS A TRACHEOSTOMY TUBE DUE TO CANCER OF LARYNX, PNEUMONIA, CABG, HTN. EXAM: CHEST - 2 VIEW COMPARISON:  To 06/15/2019 FINDINGS: Tracheostomy in place. Patient has had median sternotomy and CABG. The heart is normal in size. Significant persistent patchy opacities bilaterally, possibly more confluent at the RIGHT lung base. There is no pleural effusion. No pulmonary edema. IMPRESSION: Persistent bilateral pulmonary infiltrates representing a significant change from studies in late 2019. Consider further evaluation CT of the chest. Electronically Signed   By: Nolon Nations M.D.   On: 09/25/2018 14:29   Ct Chest W Contrast  Result Date: 09/25/2018 CLINICAL DATA:  78 year old male with a history of interstitial lung disease currently with shortness of breath on exertion, generalized weakness, low-grade fever and cough for the past 4 days. EXAM: CT CHEST WITH CONTRAST TECHNIQUE: Multidetector CT imaging of the chest was performed during intravenous contrast administration. CONTRAST:  26mL OMNIPAQUE IOHEXOL 300 MG/ML  SOLN COMPARISON:  Chest x-ray obtained earlier today; prior CT scan of the chest 06/08/2017 FINDINGS: Cardiovascular: Incompletely visualized stent in  the right common carotid artery. Patient is status post median sternotomy with evidence of prior multivessel CABG. Borderline cardiomegaly. No pericardial effusion. Conventional 3 vessel arch anatomy. No evidence of aneurysm. Mediastinum/Nodes: Tracheostomy tube. The tip of the tube is identified in the mid trachea. No suspicious mediastinal or hilar adenopathy. The esophagus is unremarkable. Lungs/Pleura: Multifocal patchy airspace opacities noted in the bilateral upper lobes slightly worse on the right than the left. Minimal patchy airspace disease also present within the superior segments of both lower lobes. Mild paraseptal pulmonary emphysema. Diffuse mild bronchial wall thickening. Areas of chronic linear scarring visualized in the right middle lobe and lingula. No pleural effusion or pneumothorax. Upper Abdomen: No acute abnormality. Surgical changes of prior cholecystectomy. Musculoskeletal: No acute fracture or aggressive appearing lytic or blastic osseous lesion. Healed median sternotomy. IMPRESSION: 1. Multifocal patchy ground-glass attenuation airspace opacities throughout both upper lobes and the superior segments of the lower lobes concerning for a multifocal infectious or inflammatory process. Multifocal pneumonia is favored. Differential consideration should include atypical and viral pneumonia. 2. Aortic Atherosclerosis (ICD10-I70.0) and Emphysema (ICD10-J43.9). 3. Well-positioned tracheostomy tube. Electronically Signed   By: Jacqulynn Cadet M.D.   On: 09/25/2018 17:25    EKG: Independently reviewed..  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  EKG unchanged from prior.  Assessment/Plan Active Problems:   Pneumonia  Pneumonia- recent hospitalization-completed course of Levaquin (penicillin allergy).  Shortness of breath, cough, low garde fever, tachypnea with significant leukocytosis of 24.  Meets sepsis criteria, but with normal lactic acid 1.2.While vancomycin was infusing patient  suddenly started itching his head, developed splotchiness/redness to his forehead and cheeks.  After vancomycin was discontinued symptoms resolved about 30 to 40 minutes later.  Benadryl was given.  Son was present at bedside.  -Continue IV meropenem,  -DC IV vancomycin, will start IV linezolid -CBC BMP a.m. -Consult pulmonology, if bronchoscopy would be helpful. - N/s 100cc/hr x 12 hrs - Swallow  eval -Echo Lasix -Respiratory virus panel, influenza panel  Laryngeal cancer, tracheostomy status 02/2018-treated for laryngeal cancer 20 years ago with surgery and chemo, per care- everywhere.  Tracheostomy for glottic stenosis, bilateral vocal cord paralysis and dyspnea. Patient on room air, but requires humidified O2 a few hours a day.  Gets tracheostomy change every few months at [facility]. -Respiratory therapy consult  Hypertension-systolic 354T to 625W. -Continue home Aldactone, Norvasc, metoprolol  CVA, Carotid artery stenosis-stable.  Has undergone carotid artery stenting. On dual antiplatelet. -Continue home aspirin and Plavix   DVT prophylaxis: Lovenox Code Status: Full Family Communication: son at bedside Disposition Plan: Per rounding team Consults called: Pulmonology Admission status: Inpt, med-surg   Bethena Roys MD [facility]  09/25/2018, 9:15 PM

## 2018-09-25 NOTE — ED Notes (Signed)
ED TO INPATIENT HANDOFF REPORT  ED Nurse Name and Phone #:  Judson Roch [phone]  S Name/Age/Gender Kenneth [name] 78 y.o. male Room/Bed: APA11/APA11  Code Status   Code Status: Prior  Home/SNF/Other Home Patient oriented to: self, place, time and situation Is this baseline? yes  Triage Complete: Triage complete  Chief Complaint Pneumonia  Triage Note PT was sent to ED by Dr. Willey Blade (PCP) today for c/o sob on exertion, generalized weakness, low grade fever, cough x4 days. PT has trach in place and denies any oxygen at home but states has recurrent pneumonia. PT had 2 tylenol tablets around 3 hours prior to ED arrival due to fever of 101.0 at home.    Allergies Allergies  Allergen Reactions  . Nifedipine Other (See Comments)  . Lorazepam Other (See Comments)    REACTION: Alters mental status. "Turns into maniac" REACTION: Alters mental status. "Turns into maniac" REACTION: Alters mental status. "Turns into maniac"  . Other     REACTION: Unknown to patient. States that MD states allergy per medical records  . Penicillins Hives    Has patient had a PCN reaction causing immediate rash, facial/tongue/throat swelling, SOB or lightheadedness with hypotension: Yes Has patient had a PCN reaction causing severe rash involving mucus membranes or skin necrosis: No Has patient had a PCN reaction that required hospitalization No Has patient had a PCN reaction occurring within the last 10 years: No If all of the above answers are "NO", then may proceed with Cephalosporin use.  Has patient had a PCN reaction causing immediate rash, facial/tongue/throat swelling, SOB or lightheadedness with hypotension: Yes Has patient had a PCN reaction causing severe rash involving mucus membranes or skin necrosis: No Has patient had a PCN reaction that required hospitalization No Has patient had a PCN reaction occurring within the last 10 years: No If all of the above answers are "NO", then may proceed  with Cephalosporin use.  . Sulfacetamide     REACTION: Unknown to patient. States that MD states allergy per medical records  . Sulfonamide Derivatives Other (See Comments)    REACTION: Unknown to patient. States that MD states allergy per medical records  . Erythromycin Rash  . Statins Itching and Rash  . Vancomycin Itching    Level of Care/Admitting Diagnosis ED Disposition    ED Disposition Condition [facility] Area: [facility] [[id number]]  Level of Care: Med-Surg [16]  Diagnosis: Pneumonia [[id number]]  Admitting Physician: Bethena Roys [[id number]]  Attending Physician: Bethena Roys [id number]  Estimated length of stay: past midnight tomorrow  Certification:: I certify this patient will need inpatient services for at least 2 midnights  PT Class (Do Not Modify): Inpatient [101]  PT Acc Code (Do Not Modify): Private [1]       B Medical/Surgery History Past Medical History:  Diagnosis Date  . Anxiety   . 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 ASCVD (arteriosclerotic cardiovascular disease)   . Chronic lower back pain    "have it at night" (05/04/2018)  . Coronary artery disease    a. 2003: s/p CABG x3V  b. 2007: cath with patent bypass grafts.  c. 09/2016: [place] with cath showing patent bypass grafts, medical Rx recommended  . CVA (cerebral vascular accident) ([place]) 10/2017   "little numb on my left face since" (05/04/2018)  . Depression   . GERD (gastroesophageal reflux disease)   . High triglycerides   . History of kidney stones   .  Hypertension   . 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 Malodorous urine    "in the last 5wks; since I had trach put in" (05/04/2018)  . Peripheral vascular disease ([place])   . Stroke [facility])    "he's had several little strokes; many that he wasn't aware of" (05/04/2018)  . Tobacco abuse   . Tracheal stenosis    Past Surgical History:  Procedure Laterality Date  . CAROTID STENT Right 06-13-12; 05/04/2018  . CAROTID STENT  INSERTION N/A 06/13/2012   Procedure: CAROTID STENT INSERTION;  Surgeon: Serafina Mitchell, MD;  Location: [facility] CATH LAB;  Service: Cardiovascular;  Laterality: N/A;  . CATARACT EXTRACTION, BILATERAL Bilateral   . COLONOSCOPY  11/10/2011   Dr. [name]: hemorrhoids, tubular adenoma  . CORNEAL TRANSPLANT Bilateral   . CORONARY ARTERY BYPASS GRAFT  ~ 2003   "CABG X3"  . EYE SURGERY    . INSERTION OF RETROGRADE CAROTID STENT Right 05/04/2018   Procedure: INSERTION OF RIGHT  CAROTID STENT using an ABBOT- XACT carotid stent system;  Surgeon: Serafina Mitchell, MD;  Location: [place];  Service: Vascular;  Laterality: Right;  . LAPAROSCOPIC CHOLECYSTECTOMY  2007  . LEFT HEART CATH AND CORS/GRAFTS ANGIOGRAPHY N/A 10/07/2016   Procedure: Left Heart Cath and Cors/Grafts Angiography;  Surgeon: Troy Sine, MD;  Location: [place] CV LAB;  Service: Cardiovascular;  Laterality: N/A;  . PARTIAL LARYNGECTOMY  1998  . TRACHEOSTOMY  03/13/2018   "@ [facility]"     A IV Location/Drains/Wounds Patient Lines/Drains/Airways Status   Active Line/Drains/Airways    Name:   Placement date:   Placement time:   Site:   Days:   Peripheral IV 09/25/18 Right Antecubital   09/25/18    1555    Antecubital   less than 1   Incision (Closed) 05/04/18 Groin Right   05/04/18    0935     144   Tracheostomy Shiley 6 mm Uncuffed   -    -    6 mm             Intake/Output Last 24 hours  Intake/Output Summary (Last 24 hours) at 09/25/2018 2004 Last data filed at 09/25/2018 1948 Gross per 24 hour  Intake 1385.94 ml  Output -  Net 1385.94 ml    Labs/Imaging Results for orders placed or performed during the hospital encounter of 09/25/18 (from the past 48 hour(s))  CBC with Differential     Status: Abnormal   Collection Time: 09/25/18  1:49 PM  Result Value Ref Range   WBC 24.0 (H) 4.0 - 10.5 K/uL   RBC 4.61 4.22 - 5.81 MIL/uL   Hemoglobin 12.8 (L) 13.0 - 17.0 g/dL   HCT 41.1 39.0 - 52.0 %   MCV 89.2 80.0 - 100.0 fL   MCH  27.8 26.0 - 34.0 pg   MCHC 31.1 30.0 - 36.0 g/dL   RDW 14.6 11.5 - 15.5 %   Platelets 411 (H) 150 - 400 K/uL   nRBC 0.0 0.0 - 0.2 %   Neutrophils Relative % 86 %   Neutro Abs 20.9 (H) 1.7 - 7.7 K/uL   Lymphocytes Relative 6 %   Lymphs Abs 1.4 0.7 - 4.0 K/uL   Monocytes Relative 6 %   Monocytes Absolute 1.4 (H) 0.1 - 1.0 K/uL   Eosinophils Relative 1 %   Eosinophils Absolute 0.1 0.0 - 0.5 K/uL   Basophils Relative 0 %   Basophils Absolute 0.1 0.0 - 0.1 K/uL   Immature  Granulocytes 1 %   Abs Immature Granulocytes 0.13 (H) 0.00 - 0.07 K/uL    Comment: Performed at [facility], [address] [place], [place] [address]  Comprehensive metabolic panel     Status: Abnormal   Collection Time: 09/25/18  1:49 PM  Result Value Ref Range   Sodium 135 135 - 145 mmol/L   Potassium 4.6 3.5 - 5.1 mmol/L   Chloride 101 98 - 111 mmol/L   CO2 24 22 - 32 mmol/L   Glucose, Bld 90 70 - 99 mg/dL   BUN 21 8 - 23 mg/dL   Creatinine, Ser 1.33 (H) 0.61 - 1.24 mg/dL   Calcium 9.4 8.9 - 10.3 mg/dL   Total Protein 7.5 6.5 - 8.1 g/dL   Albumin 3.4 (L) 3.5 - 5.0 g/dL   AST 14 (L) 15 - 41 U/L   ALT 15 0 - 44 U/L   Alkaline Phosphatase 109 38 - 126 U/L   Total Bilirubin 0.5 0.3 - 1.2 mg/dL   GFR calc non Af Amer 50 (L) >60 mL/min   GFR calc Af Amer 59 (L) >60 mL/min   Anion gap 10 5 - 15    Comment: Performed at [facility], [address] [place], [place] [address]  Lactic acid, plasma     Status: None   Collection Time: 09/25/18  3:50 PM  Result Value Ref Range   Lactic Acid, Venous 1.2 0.5 - 1.9 mmol/L    Comment: Performed at [facility], [address] [place], [place] [address]  Urinalysis, Routine w reflex microscopic (not at [facility])     Status: None   Collection Time: 09/25/18  5:05 PM  Result Value Ref Range   Color, Urine YELLOW YELLOW   APPearance CLEAR CLEAR   Specific Gravity, Urine 1.010 1.005 - 1.030   pH 7.0 5.0 - 8.0   Glucose, UA NEGATIVE NEGATIVE mg/dL   Hgb urine dipstick  NEGATIVE NEGATIVE   Bilirubin Urine NEGATIVE NEGATIVE   Ketones, ur NEGATIVE NEGATIVE mg/dL   Protein, ur NEGATIVE NEGATIVE mg/dL   Nitrite NEGATIVE NEGATIVE   Leukocytes,Ua NEGATIVE NEGATIVE    Comment: Performed at [facility], [address] [place], [place] [address]  Lactic acid, plasma     Status: None   Collection Time: 09/25/18  5:37 PM  Result Value Ref Range   Lactic Acid, Venous 1.8 0.5 - 1.9 mmol/L    Comment: Performed at [facility], [address] [place],  [address]   Dg Chest 2 View  Result Date: 09/25/2018 CLINICAL DATA:  PATIENT DISCHARGED FROM [facility] 3 WEEKS AGO AND TREATED FOR PNEUMONIA. PATIENT CONTINUES TO HAVE COUGH, SOB, AND FEVER SINCE DISCHARGE. HX: QUIT SMOKING 61 YEARS AGO, HAS A TRACHEOSTOMY TUBE DUE TO CANCER OF LARYNX, PNEUMONIA, CABG, HTN. EXAM: CHEST - 2 VIEW COMPARISON:  To 06/15/2019 FINDINGS: Tracheostomy in place. Patient has had median sternotomy and CABG. The heart is normal in size. Significant persistent patchy opacities bilaterally, possibly more confluent at the RIGHT lung base. There is no pleural effusion. No pulmonary edema. IMPRESSION: Persistent bilateral pulmonary infiltrates representing a significant change from studies in late 2019. Consider further evaluation CT of the chest. Electronically Signed   By: Nolon Nations M.D.   On: 09/25/2018 14:29   Ct Chest W Contrast  Result Date: 09/25/2018 CLINICAL DATA:  78 year old male with a history of interstitial lung disease currently with shortness of breath on exertion, generalized weakness, low-grade fever and cough for the  past 4 days. EXAM: CT CHEST WITH CONTRAST TECHNIQUE: Multidetector CT imaging of the chest was performed during intravenous contrast administration. CONTRAST:  30mL OMNIPAQUE IOHEXOL 300 MG/ML  SOLN COMPARISON:  Chest x-ray obtained earlier today; prior CT scan of the chest 06/08/2017 FINDINGS: Cardiovascular: Incompletely visualized stent in the right  common carotid artery. Patient is status post median sternotomy with evidence of prior multivessel CABG. Borderline cardiomegaly. No pericardial effusion. Conventional 3 vessel arch anatomy. No evidence of aneurysm. Mediastinum/Nodes: Tracheostomy tube. The tip of the tube is identified in the mid trachea. No suspicious mediastinal or hilar adenopathy. The esophagus is unremarkable. Lungs/Pleura: Multifocal patchy airspace opacities noted in the bilateral upper lobes slightly worse on the right than the left. Minimal patchy airspace disease also present within the superior segments of both lower lobes. Mild paraseptal pulmonary emphysema. Diffuse mild bronchial wall thickening. Areas of chronic linear scarring visualized in the right middle lobe and lingula. No pleural effusion or pneumothorax. Upper Abdomen: No acute abnormality. Surgical changes of prior cholecystectomy. Musculoskeletal: No acute fracture or aggressive appearing lytic or blastic osseous lesion. Healed median sternotomy. IMPRESSION: 1. Multifocal patchy ground-glass attenuation airspace opacities throughout both upper lobes and the superior segments of the lower lobes concerning for a multifocal infectious or inflammatory process. Multifocal pneumonia is favored. Differential consideration should include atypical and viral pneumonia. 2. Aortic Atherosclerosis (ICD10-I70.0) and Emphysema (ICD10-J43.9). 3. Well-positioned tracheostomy tube. Electronically Signed   By: Jacqulynn Cadet M.D.   On: 09/25/2018 17:25    Pending Labs Unresulted Labs (From admission, onward)    Start     Ordered   09/27/18 0500  Creatinine, serum  Every Mon-Wed-Fri (0500),   R     09/25/18 1652   09/25/18 1900  Respiratory Panel by PCR  (Respiratory virus panel with precautions)  Once,   R     09/25/18 1902   09/25/18 1634  Urine culture  ONCE - STAT,   STAT     09/25/18 1635   09/25/18 1511  Culture, blood (routine x 2)  BLOOD CULTURE X 2,   STAT     09/25/18  1510   Signed and Held  Basic metabolic panel  Tomorrow morning,   R     Signed and Held   Signed and Held  CBC  Tomorrow morning,   R     Signed and Held          Vitals/Pain Today's Vitals   09/25/18 1730 09/25/18 1830 09/25/18 1900 09/25/18 1930  BP: 123/70 (!) 154/74 (!) 141/86 (!) 145/76  Pulse: 76 74 74 75  Resp: (!) 24 17 19  (!) 22  Temp:  98.1 F (36.7 C)    TempSrc:      SpO2: 93% 98% 93% 91%  Weight:      Height:      PainSc:        Isolation Precautions Droplet precaution  Medications Medications  0.9 %  sodium chloride infusion ( Intravenous Rate/Dose Verify 09/25/18 1948)  meropenem (MERREM) 1 g in sodium chloride 0.9 % 100 mL IVPB ( Intravenous Stopped 09/25/18 1829)  vancomycin (VANCOCIN) 1,250 mg in sodium chloride 0.9 % 250 mL IVPB (has no administration in time range)  0.9 %  sodium chloride infusion ( Intravenous Restarted 09/25/18 1838)  sodium chloride 0.9 % bolus 1,000 mL ( Intravenous Stopped 09/25/18 1705)  vancomycin (VANCOCIN) IVPB 1000 mg/200 mL premix ( Intravenous Stopped 09/25/18 1822)    Followed by  vancomycin (VANCOCIN) IVPB 1000  mg/200 mL premix ( Intravenous Stopped 09/25/18 1912)  iohexol (OMNIPAQUE) 300 MG/ML solution 75 mL (75 mLs Intravenous Contrast Given 09/25/18 1705)  diphenhydrAMINE (BENADRYL) injection 12.5 mg (12.5 mg Intravenous Given 09/25/18 1913)    Mobility walks Low fall risk   Focused Assessments Cardiac Assessment Handoff:    Lab Results  Component Value Date   CKTOTAL 57 08/13/2011   CKMB 1.8 08/13/2011   TROPONINI <0.03 09/05/2018   Lab Results  Component Value Date   DDIMER 0.39 06/10/2018   Does the Patient currently have chest pain? No  , Pulmonary Assessment Handoff:  Lung sounds: R Breath Sounds: Rales O2 Device: Room Air        R Recommendations: See Admitting Provider Note  Report given to: Anderson Malta   Additional Notes: n/a

## 2018-09-25 NOTE — ED Notes (Signed)
Pt in CT at this time.

## 2018-09-25 NOTE — [place] Notes (Signed)
Pharmacy Antibiotic Note  Kenneth [name] is a 78 y.o. male admitted on 09/25/2018 with pneumonia.  Pharmacy has been consulted for Vancomycin and Merrem dosing.  Plan: Vancomycin 2000 mg IV x 1 dose. Vancomycin 1250 mg IV every 24 hours.  Goal trough 15-20 mcg/mL.  Meropenem 1000 mg IV every 12 hours. Monitor labs, c/s, and vanco levels as indicated.  Height: 5\' 5"  (165.1 cm) Weight: 180 lb (81.6 kg) IBW/kg (Calculated) : 61.5  Temp (24hrs), Avg:99.7 F (37.6 C), Min:99.3 F (37.4 C), Max:100.1 F (37.8 C)  Recent Labs  Lab 09/25/18 1349 09/25/18 1550  WBC 24.0*  --   CREATININE 1.33*  --   LATICACIDVEN  --  1.2    Estimated Creatinine Clearance: 44.3 mL/min (A) (by C-G formula based on SCr of 1.33 mg/dL (H)).    Allergies  Allergen Reactions  . Nifedipine Other (See Comments)  . Lorazepam Other (See Comments)    REACTION: Alters mental status. "Turns into maniac" REACTION: Alters mental status. "Turns into maniac" REACTION: Alters mental status. "Turns into maniac"  . Other     REACTION: Unknown to patient. States that MD states allergy per medical records  . Penicillins Hives    Has patient had a PCN reaction causing immediate rash, facial/tongue/throat swelling, SOB or lightheadedness with hypotension: Yes Has patient had a PCN reaction causing severe rash involving mucus membranes or skin necrosis: No Has patient had a PCN reaction that required hospitalization No Has patient had a PCN reaction occurring within the last 10 years: No If all of the above answers are "NO", then may proceed with Cephalosporin use.  Has patient had a PCN reaction causing immediate rash, facial/tongue/throat swelling, SOB or lightheadedness with hypotension: Yes Has patient had a PCN reaction causing severe rash involving mucus membranes or skin necrosis: No Has patient had a PCN reaction that required hospitalization No Has patient had a PCN reaction occurring within the last 10 years:  No If all of the above answers are "NO", then may proceed with Cephalosporin use.  . Sulfacetamide     REACTION: Unknown to patient. States that MD states allergy per medical records  . Sulfonamide Derivatives Other (See Comments)    REACTION: Unknown to patient. States that MD states allergy per medical records  . Erythromycin Rash  . Statins Itching and Rash    Antimicrobials this admission: Vanco 3/2 >>  Merrem 3/2 >>   Dose adjustments this admission: Meropenem  Microbiology results: 3/2 BCx: pending 3/2 UCx: pending    Thank you for allowing pharmacy to be a part of this patient's care.  Ramond Craver 09/25/2018 4:59 PM

## 2018-09-25 NOTE — ED Notes (Signed)
[facility] with Merrem and Vancomycin.

## 2018-09-26 ENCOUNTER — Ambulatory Visit: Payer: PRIVATE HEALTH INSURANCE | Admitting: Cardiology

## 2018-09-26 LAB — RESPIRATORY PANEL BY PCR

## 2018-09-26 LAB — CBC
HCT: 37.1 % — ABNORMAL LOW (ref 39.0–52.0)
Hemoglobin: 11.3 g/dL — ABNORMAL LOW (ref 13.0–17.0)
MCH: 27.8 pg (ref 26.0–34.0)
MCHC: 30.5 g/dL (ref 30.0–36.0)
MCV: 91.2 fL (ref 80.0–100.0)
Platelets: 353 10*3/uL (ref 150–400)
RBC: 4.07 MIL/uL — ABNORMAL LOW (ref 4.22–5.81)
RDW: 14.8 % (ref 11.5–15.5)
WBC: 12.9 10*3/uL — ABNORMAL HIGH (ref 4.0–10.5)
nRBC: 0 % (ref 0.0–0.2)

## 2018-09-26 LAB — BASIC METABOLIC PANEL
Anion gap: 9 (ref 5–15)
BUN: 18 mg/dL (ref 8–23)
CO2: 25 mmol/L (ref 22–32)
Calcium: 8.6 mg/dL — ABNORMAL LOW (ref 8.9–10.3)
Chloride: 105 mmol/L (ref 98–111)
Creatinine, Ser: 1.23 mg/dL (ref 0.61–1.24)
GFR calc Af Amer: >60 mL/min (ref >60)
GFR calc non Af Amer: 55 mL/min — ABNORMAL LOW (ref >60)
Glucose, Bld: 96 mg/dL (ref 70–99)
Potassium: 3.7 mmol/L (ref 3.5–5.1)
Sodium: 139 mmol/L (ref 135–145)

## 2018-09-26 LAB — STREP PNEUMONIAE URINARY ANTIGEN: Strep Pneumo Urinary Antigen: NEGATIVE

## 2018-09-26 LAB — PROCALCITONIN: Procalcitonin: 0.11 ng/mL

## 2018-09-26 MED ORDER — ZOLPIDEM TARTRATE 5 MG PO TABS
5.0000 mg | ORAL_TABLET | Freq: Every evening | ORAL | Status: DC | PRN
  Administered 2018-09-26 – 2018-09-28 (×3): 5 mg via ORAL
  Filled 2018-09-26 (×3): qty 1

## 2018-09-26 NOTE — Evaluation (Signed)
Clinical/Bedside Swallow Evaluation Patient Details  Name: Kenneth [name] MRN: [id number] Date of Birth: 1940-11-14  Today's Date: 09/26/2018 Time: SLP Start Time (ACUTE ONLY): 0865 SLP Stop Time (ACUTE ONLY): 1642 SLP Time Calculation (min) (ACUTE ONLY): 29 min  Past Medical History:  Past Medical History:  Diagnosis Date  . Anxiety   . 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 ASCVD (arteriosclerotic cardiovascular disease)   . Chronic lower back pain    "have it at night" (05/04/2018)  . Coronary artery disease    a. 2003: s/p CABG x3V  b. 2007: cath with patent bypass grafts.  c. 09/2016: [place] with cath showing patent bypass grafts, medical Rx recommended  . CVA (cerebral vascular accident) ([place]) 10/2017   "little numb on my left face since" (05/04/2018)  . Depression   . GERD (gastroesophageal reflux disease)   . High triglycerides   . History of kidney stones   . Hypertension   . 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 Malodorous urine    "in the last 5wks; since I had trach put in" (05/04/2018)  . Peripheral vascular disease ([place])   . Stroke [facility])    "he's had several little strokes; many that he wasn't aware of" (05/04/2018)  . Tobacco abuse   . Tracheal stenosis    Past Surgical History:  Past Surgical History:  Procedure Laterality Date  . CAROTID STENT Right 06-13-12; 05/04/2018  . CAROTID STENT INSERTION N/A 06/13/2012   Procedure: CAROTID STENT INSERTION;  Surgeon: Serafina Mitchell, MD;  Location: [facility] CATH LAB;  Service: Cardiovascular;  Laterality: N/A;  . CATARACT EXTRACTION, BILATERAL Bilateral   . COLONOSCOPY  11/10/2011   Dr. [name]: hemorrhoids, tubular adenoma  . CORNEAL TRANSPLANT Bilateral   . CORONARY ARTERY BYPASS GRAFT  ~ 2003   "CABG X3"  . EYE SURGERY    . INSERTION OF RETROGRADE CAROTID STENT Right 05/04/2018   Procedure: INSERTION OF RIGHT  CAROTID STENT using an ABBOT- XACT carotid stent system;  Surgeon: Serafina Mitchell, MD;  Location: [place];  Service:  Vascular;  Laterality: Right;  . LAPAROSCOPIC CHOLECYSTECTOMY  2007  . LEFT HEART CATH AND CORS/GRAFTS ANGIOGRAPHY N/A 10/07/2016   Procedure: Left Heart Cath and Cors/Grafts Angiography;  Surgeon: Troy Sine, MD;  Location: [place] CV LAB;  Service: Cardiovascular;  Laterality: N/A;  . PARTIAL LARYNGECTOMY  1998  . TRACHEOSTOMY  03/13/2018   "@ [facility]"   HPI:  Kenneth [name] a 78 y.o.malewith medical history significant fortracheostomy for laryngeal malignancy,HTN,carotid artery stenosis,CVAwho presented to the ED with complaints of difficulty breathing, with cough and fevers over the past 2 days. T-max of 100.1 recorded. Recent hospitalization2/11- 2/13-also for pneumonia,with negative influenza and respiratory viral panel. Patient was started on Levaquin in hospital and discharged home on same. He reports compliance with the medication. Son reports some fevers initially on discharge-for about 2 days, but this resolved. Patient felt clinically better until 2 days ago. 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 He had a FEES at [facility] in 2017 with recommendation for MBSS, however Pt declined at that time. BSE requested.   Assessment / Plan / Recommendation Clinical Impression  Pt has a Shiley #6 trach in place (August 2019) and s/p partial laryngectomy 20 years ago. Oral motor examination is unremarkable. Pt has HDME he started wearing recently when trach collar not in place (seen at bedside). Pt self presented regular textures and  thin liquids via cup and straw sips without overt signs or symptoms of aspiration. Given altered anatomy from previous laryngeal cancer surgery and recent placement of trach (August 2019) and history of dysphagia (2017), recommend obtaining a baseline MBSS during acute stay. Pt and spouse are in agreement with plan of care. SLP will arrange for MBSS tomorrow AM.  SLP Visit Diagnosis:  Dysphagia, unspecified (R13.10)    Aspiration Risk  Mild aspiration risk    Diet Recommendation Regular;Thin liquid   Liquid Administration via: Cup;Straw Medication Administration: Whole meds with liquid Supervision: Patient able to self feed Postural Changes: Seated upright at 90 degrees;Remain upright for at least 30 minutes after po intake    Other  Recommendations Oral Care Recommendations: Oral care BID Other Recommendations: Clarify dietary restrictions   Follow up Recommendations None      Frequency and Duration min 2x/week  1 week       Prognosis Prognosis for Safe Diet Advancement: Good      Swallow Study   General Date of Onset: 09/25/18 HPI: Kenneth [name] a 78 y.o.malewith medical history significant fortracheostomy for laryngeal malignancy,HTN,carotid artery stenosis,CVAwho presented to the ED with complaints of difficulty breathing, with cough and fevers over the past 2 days. T-max of 100.1 recorded. Recent hospitalization2/11- 2/13-also for pneumonia,with negative influenza and respiratory viral panel. Patient was started on Levaquin in hospital and discharged home on same. He reports compliance with the medication. Son reports some fevers initially on discharge-for about 2 days, but this resolved. Patient felt clinically better until 2 days ago. 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 He had a FEES at [facility] in 2017 with recommendation for MBSS, however Pt declined at that time. BSE requested. Type of Study: Bedside Swallow Evaluation Diet Prior to this Study: Regular;Thin liquids Temperature Spikes Noted: No Respiratory Status: Trach Collar History of Recent Intubation: No(Pt has Shiley trach) Behavior/Cognition: Alert;Cooperative;Pleasant mood Oral Cavity Assessment: Within Functional Limits Oral Care Completed by SLP: No Oral Cavity - Dentition: Dentures, top;Dentures,  bottom Vision: Functional for self-feeding Self-Feeding Abilities: Able to feed self Patient Positioning: Upright in bed Baseline Vocal Quality: (Pt occludes trach with finger for voicing) Volitional Cough: Strong Volitional Swallow: Able to elicit    Oral/Motor/Sensory Function Overall Oral Motor/Sensory Function: Within functional limits   Ice Chips Ice chips: Within functional limits Presentation: Spoon   Thin Liquid Thin Liquid: Within functional limits Presentation: Cup;Straw;Self Fed    Nectar Thick Nectar Thick Liquid: Not tested   Honey Thick Honey Thick Liquid: Not tested   Puree Puree: Within functional limits Presentation: Spoon;Self Fed   Solid     Solid: Within functional limits Presentation: Self Fed     Thank you,  Genene Churn, [place] 5:23 PM  Clavin Ruhlman 09/26/2018,5:23 PM

## 2018-09-26 NOTE — Progress Notes (Signed)
PROGRESS NOTE    Kenneth [name]  CHY:[id number] DOB: 1940/08/30 DOA: 09/25/2018 PCP: [name], MD   Brief Narrative:  Per HPI:  Kenneth [name] is a 78 y.o. male with medical history significant for tracheostomy for laryngeal malignancy,HTN,carotid artery stenosis,CVA who presented to the ED with complaints of difficulty breathing, with cough and fevers over the past 2 days.  T-max of 100.1 recorded. Recent hospitalization 2/11- 2/13-also for pneumonia, with negative influenza and respiratory viral panel.  Patient was started on Levaquin in hospital and discharged home on same.  He reports compliance with the medication.  Son reports some fevers initially on discharge-for about 2 days, but this resolved.  Patient felt clinically better until 2 days ago.  No vomiting, reports 1 or 2 loose stools over the past 2 days, no abdominal pain, no burning with urination.  No neck stiffness or pain.  He denies difficulty or problems with swallowing.  Assessment & Plan:   Active Problems:   Pneumonia   Pneumonia- recent hospitalization-completed course of Levaquin (penicillin allergy).  Shortness of breath, cough, low garde fever, tachypnea with significant leukocytosis of 24.  Meets sepsis criteria, but with normal lactic acid 1.2.While vancomycin was infusing patient suddenly started itching his head, developed splotchiness/redness to his forehead and cheeks.  After vancomycin was discontinued symptoms resolved about 30 to 40 minutes later.  Benadryl was given.  Son was present at bedside.  -Continue IV meropenem and linezolid as prescribed with vancomycin discontinued.  No urgent need for pulmonary evaluation at this time as patient is clinically improving.  Continue to follow respiratory virus panel as well as influenza panel.  Sputum cultures have been ordered as well as urine Legionella and strep pneumonia.  Follow-up a.m. labs. -We will order procalcitonin as well to see whether this is a  viral or bacterial pneumonia.  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 Patient on room air, but requires humidified O2 a few hours a day.Gets tracheostomy change every few months at [facility]. -Respiratory therapy consult  Hypertension-systolic 878M to 767M. -Continue home Aldactone, Norvasc, metoprolol -Currently well controlled  CVA, Carotidartery stenosis-stable.Has undergone carotid artery stenting. On dual antiplatelet. -Continue home aspirin and Plavix   DVT prophylaxis: Lovenox Code Status: Full Family Communication: None at bedside Disposition Plan: Continue on current antibiotics as patient is clinically improving.  Await further results from respiratory panel, sputum panel, urine Legionella, and urine strep pneumonia.  Could feasibly convert to oral antibiotics in the next 24 to 48 hours and consider discharge.   Consultants:   None  Procedures:   None  Antimicrobials:   Linezolid and Merrem 3/2->   Subjective: Patient seen and evaluated today with no new acute complaints or concerns. No acute concerns or events noted overnight.  He states that overall he is feeling much better and denies any significant cough, wheezing, or shortness of breath.  Objective: Vitals:   09/25/18 2117 09/26/18 0602 09/26/18 0945 09/26/18 1100  BP: 137/60 127/68    Pulse: 90 60    Resp:      Temp: 100 F (37.8 C) 98.2 F (36.8 C)    TempSrc: Oral Oral    SpO2: 94%  96% 97%  Weight:      Height:        Intake/Output Summary (Last 24 hours) at 09/26/2018 1320 Last data filed at 09/26/2018 0500 Gross per 24 hour  Intake 2586.28 ml  Output -  Net 2586.28 ml   Filed Weights   09/25/18 1329  Weight: 81.6 kg    Examination:  General exam: Appears calm and comfortable  Respiratory system:  Clear to auscultation. Respiratory effort normal. Tracheostomy present. Currently on RA with no respiratory distress. Cardiovascular system: S1 & S2 heard, RRR. No JVD, murmurs, rubs, gallops or clicks. No pedal edema. Gastrointestinal system: Abdomen is nondistended, soft and nontender. No organomegaly or masses felt. Normal bowel sounds heard. Central nervous system: Alert and oriented. No focal neurological deficits. Extremities: Symmetric 5 x 5 power. Skin: No rashes, lesions or ulcers Psychiatry: Judgement and insight appear normal. Mood & affect appropriate.     Data Reviewed: I have personally reviewed following labs and imaging studies  CBC: Recent Labs  Lab 09/25/18 1349 09/26/18 0516  WBC 24.0* 12.9*  NEUTROABS 20.9*  --   HGB 12.8* 11.3*  HCT 41.1 37.1*  MCV 89.2 91.2  PLT 411* 371   Basic Metabolic Panel: Recent Labs  Lab 09/25/18 1349 09/26/18 0516  NA 135 139  K 4.6 3.7  CL 101 105  CO2 24 25  GLUCOSE 90 96  BUN 21 18  CREATININE 1.33* 1.23  CALCIUM 9.4 8.6*   GFR: Estimated Creatinine Clearance: 47.9 mL/min (by C-G formula based on SCr of 1.23 mg/dL). Liver Function Tests: Recent Labs  Lab 09/25/18 1349  AST 14*  ALT 15  ALKPHOS 109  BILITOT 0.5  PROT 7.5  ALBUMIN 3.4*   No results for input(s): LIPASE, AMYLASE in the last 168 hours. No results for input(s): AMMONIA in the last 168 hours. Coagulation Profile: No results for input(s): INR, PROTIME in the last 168 hours. Cardiac Enzymes: No results for input(s): CKTOTAL, CKMB, CKMBINDEX, TROPONINI in the last 168 hours. BNP (last 3 results) No results for input(s): PROBNP in the last 8760 hours. HbA1C: No results for input(s): HGBA1C in the last 72 hours. CBG: No results for input(s): GLUCAP in the last 168 hours. Lipid Profile: No results for input(s): CHOL, HDL, LDLCALC, TRIG, CHOLHDL, LDLDIRECT in the last 72 hours. Thyroid Function Tests: No results for input(s): TSH, T4TOTAL, FREET4,  T3FREE, THYROIDAB in the last 72 hours. Anemia Panel: No results for input(s): VITAMINB12, FOLATE, FERRITIN, TIBC, IRON, RETICCTPCT in the last 72 hours. Sepsis Labs: Recent Labs  Lab 09/25/18 1550 09/25/18 1737  LATICACIDVEN 1.2 1.8    Recent Results (from the past 240 hour(s))  Culture, blood (routine x 2)     Status: None (Preliminary result)   Collection Time: 09/25/18  3:51 PM  Result Value Ref Range Status   Specimen Description BLOOD RIGHT ANTECUBITAL DRAWN BY RN  Final   Special Requests   Final    BOTTLES DRAWN AEROBIC AND ANAEROBIC Blood Culture adequate volume   Culture   Final    NO GROWTH < 24 HOURS Performed at [facility], [address] [place], [place] [address]    Report Status PENDING  Incomplete  Culture, blood (routine x 2)     Status: None (Preliminary result)   Collection Time: 09/25/18  3:52 PM  Result Value Ref Range Status   Specimen Description BLOOD RIGHT HAND DRAWN BY RN  Final   Special Requests   Final    BOTTLES DRAWN AEROBIC ONLY Blood Culture results may not be optimal due to an inadequate volume of blood received in culture bottles   Culture   Final    NO GROWTH < 24 HOURS Performed  at [facility], [address] [place], [place] [address]    Report Status PENDING  Incomplete         Radiology Studies: Dg Chest 2 View  Result Date: 09/25/2018 CLINICAL DATA:  PATIENT DISCHARGED FROM [facility] 3 WEEKS AGO AND TREATED FOR PNEUMONIA. PATIENT CONTINUES TO HAVE COUGH, SOB, AND FEVER SINCE DISCHARGE. HX: QUIT SMOKING 81 YEARS AGO, HAS A TRACHEOSTOMY TUBE DUE TO CANCER OF LARYNX, PNEUMONIA, CABG, HTN. EXAM: CHEST - 2 VIEW COMPARISON:  To 06/15/2019 FINDINGS: Tracheostomy in place. Patient has had median sternotomy and CABG. The heart is normal in size. Significant persistent patchy opacities bilaterally, possibly more confluent at the RIGHT lung base. There is no pleural effusion. No pulmonary edema. IMPRESSION: Persistent bilateral  pulmonary infiltrates representing a significant change from studies in late 2019. Consider further evaluation CT of the chest. Electronically Signed   By: Nolon Nations M.D.   On: 09/25/2018 14:29   Ct Chest W Contrast  Result Date: 09/25/2018 CLINICAL DATA:  78 year old male with a history of interstitial lung disease currently with shortness of breath on exertion, generalized weakness, low-grade fever and cough for the past 4 days. EXAM: CT CHEST WITH CONTRAST TECHNIQUE: Multidetector CT imaging of the chest was performed during intravenous contrast administration. CONTRAST:  49mL OMNIPAQUE IOHEXOL 300 MG/ML  SOLN COMPARISON:  Chest x-ray obtained earlier today; prior CT scan of the chest 06/08/2017 FINDINGS: Cardiovascular: Incompletely visualized stent in the right common carotid artery. Patient is status post median sternotomy with evidence of prior multivessel CABG. Borderline cardiomegaly. No pericardial effusion. Conventional 3 vessel arch anatomy. No evidence of aneurysm. Mediastinum/Nodes: Tracheostomy tube. The tip of the tube is identified in the mid trachea. No suspicious mediastinal or hilar adenopathy. The esophagus is unremarkable. Lungs/Pleura: Multifocal patchy airspace opacities noted in the bilateral upper lobes slightly worse on the right than the left. Minimal patchy airspace disease also present within the superior segments of both lower lobes. Mild paraseptal pulmonary emphysema. Diffuse mild bronchial wall thickening. Areas of chronic linear scarring visualized in the right middle lobe and lingula. No pleural effusion or pneumothorax. Upper Abdomen: No acute abnormality. Surgical changes of prior cholecystectomy. Musculoskeletal: No acute fracture or aggressive appearing lytic or blastic osseous lesion. Healed median sternotomy. IMPRESSION: 1. Multifocal patchy ground-glass attenuation airspace opacities throughout both upper lobes and the superior segments of the lower lobes  concerning for a multifocal infectious or inflammatory process. Multifocal pneumonia is favored. Differential consideration should include atypical and viral pneumonia. 2. Aortic Atherosclerosis (ICD10-I70.0) and Emphysema (ICD10-J43.9). 3. Well-positioned tracheostomy tube. Electronically Signed   By: Jacqulynn Cadet M.D.   On: 09/25/2018 17:25        Scheduled Meds: . amLODipine  10 mg Oral q morning - 10a  . aspirin  325 mg Oral Daily  . clopidogrel  75 mg Oral q morning - 10a  . enoxaparin (LOVENOX) injection  40 mg Subcutaneous Q24H  . ezetimibe  10 mg Oral q morning - 10a  . guaiFENesin-dextromethorphan  10 mL Oral Q8H  . metoprolol tartrate  25 mg Oral q morning - 10a  . pantoprazole  40 mg Oral Daily  . spironolactone  25 mg Oral q morning - 10a   Continuous Infusions: . linezolid (ZYVOX) IV 600 mg (09/26/18 0945)  . meropenem (MERREM) IV 1 g (09/26/18 0457)     LOS: 1 day    Time spent: 30 minutes    Ginia Rudell Darleen Crocker, DO [facility] Pager [phone]  If 7PM-7AM, please contact night-coverage [email or web address] Password [email or web address] 09/26/2018, 1:20 PM

## 2018-09-27 ENCOUNTER — Inpatient Hospital Stay (HOSPITAL_COMMUNITY): Payer: Medicare HMO

## 2018-09-27 LAB — BASIC METABOLIC PANEL
Anion gap: 8 (ref 5–15)
BUN: 16 mg/dL (ref 8–23)
CO2: 25 mmol/L (ref 22–32)
Calcium: 9.1 mg/dL (ref 8.9–10.3)
Chloride: 104 mmol/L (ref 98–111)
Creatinine, Ser: 1.14 mg/dL (ref 0.61–1.24)
GFR calc Af Amer: >60 mL/min (ref >60)
GFR calc non Af Amer: >60 mL/min (ref >60)
Glucose, Bld: 103 mg/dL — ABNORMAL HIGH (ref 70–99)
Potassium: 4.6 mmol/L (ref 3.5–5.1)
Sodium: 137 mmol/L (ref 135–145)

## 2018-09-27 LAB — URINE CULTURE: Culture: <10000 — AB

## 2018-09-27 LAB — CBC
HCT: 37.5 % — ABNORMAL LOW (ref 39.0–52.0)
Hemoglobin: 11.2 g/dL — ABNORMAL LOW (ref 13.0–17.0)
MCH: 27.3 pg (ref 26.0–34.0)
MCHC: 29.9 g/dL — ABNORMAL LOW (ref 30.0–36.0)
MCV: 91.5 fL (ref 80.0–100.0)
Platelets: 347 10*3/uL (ref 150–400)
RBC: 4.1 MIL/uL — ABNORMAL LOW (ref 4.22–5.81)
RDW: 14.9 % (ref 11.5–15.5)
WBC: 13.8 10*3/uL — ABNORMAL HIGH (ref 4.0–10.5)
nRBC: 0 % (ref 0.0–0.2)

## 2018-09-27 LAB — LEGIONELLA PNEUMOPHILA SEROGP 1 UR AG: L. pneumophila Serogp 1 Ur Ag: NEGATIVE

## 2018-09-27 NOTE — Progress Notes (Signed)
PROGRESS NOTE    Kenneth [name]  JGO:[id number] DOB: [birth year] DOA: 09/25/2018 PCP: [name], MD   Brief Narrative:  Per HPI:  Kenneth [name] is a 78 y.o. male with medical history significant for tracheostomy for laryngeal malignancy,HTN,carotid artery stenosis,CVA who presented to the ED with complaints of difficulty breathing, with cough and fevers over the past 2 days.  T-max of 100.1 recorded. Recent hospitalization 2/11- 2/13-also for pneumonia, with negative influenza and respiratory viral panel.  Patient was started on Levaquin in hospital and discharged home on same.  He reports compliance with the medication.  Son reports some fevers initially on discharge-for about 2 days, but this resolved.  Patient felt clinically better until 2 days ago.  No vomiting, reports 1 or 2 loose stools over the past 2 days, no abdominal pain, no burning with urination.  No neck stiffness or pain.  He denies difficulty or problems with swallowing.  Assessment & Plan:   Active Problems:   Pneumonia   HCAP -Pneumonia- recent hospitalization-completed course of Levaquin (penicillin allergy).  Shortness of breath, cough, low garde fever, tachypnea with significant leukocytosis persist,  Met sepsis criteria on admission-Continue IV meropenem (started 09/25/18) , switch from vancomycin to Zyvox on 09/28/2018 due to concerns about possible vancomycin allergy versus "red man" syndrome .    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  Prior to admission patient on room air with trach collar with 40% humidity , currently on humidified O2 , Gets tracheostomy change every few months at [facility]. -Respiratory therapy consult/input appreciated  Hypertension-systolic 559R to 416L. -Continue home Aldactone, Norvasc,  metoprolol -Currently well controlled  CVA, Carotidartery stenosis-stable.Has undergone carotid artery stenting. On dual antiplatelet. -Continue home aspirin and Plavix   DVT prophylaxis: Lovenox Code Status: Full Family Communication: None at bedside Disposition Plan: Recurrent hospitalization, requiring IV antibiotics for H CAP  Consultants:   None  Procedures:   None  Antimicrobials:   Linezolid (3/5) and Merrem 3/2->   Subjective: Cough and shortness of breath persist, requiring suctioning from RT  Objective: Vitals:   09/26/18 2200 09/27/18 0432 09/27/18 0500 09/27/18 1340  BP: (!) 141/65 139/67  130/68  Pulse: 62 66  (!) 54  Resp: '20 20  16  ' Temp: 98.3 F (36.8 C) 98.7 F (37.1 C)  97.7 F (36.5 C)  TempSrc: Oral Oral  Oral  SpO2: 100% 91%  98%  Weight:   81.6 kg   Height:        Intake/Output Summary (Last 24 hours) at 09/27/2018 1842 Last data filed at 09/27/2018 1121 Gross per 24 hour  Intake 400 ml  Output 1400 ml  Net -1000 ml   Filed Weights   09/25/18 1329 09/27/18 0500  Weight: 81.6 kg 81.6 kg    Examination:  General exam: Appears calm and comfortable  Respiratory system: Scattered wheezes and rhonchi bilaterally ,tracheostomy present.  Requiring humidified oxygen, PTA was on humidified air at home Cardiovascular system: S1 & S2 heard, RRR. No JVD, murmurs, rubs, gallops or clicks. No pedal edema. Gastrointestinal system: Abdomen is nondistended, soft and nontender. No organomegaly or masses felt. Normal bowel sounds heard. Central nervous system: Alert and oriented. No focal neurological deficits. Extremities: Symmetric 5 x 5 power. Skin: No rashes, lesions or ulcers Psychiatry: Judgement and insight appear normal. Mood & affect appropriate.   CBC: Recent Labs  Lab 09/25/18 1349 09/26/18  9741 09/27/18 0441  WBC 24.0* 12.9* 13.8*  NEUTROABS 20.9*  --   --   HGB 12.8* 11.3* 11.2*  HCT 41.1 37.1* 37.5*  MCV 89.2 91.2 91.5  PLT  411* 353 638   Basic Metabolic Panel: Recent Labs  Lab 09/25/18 1349 09/26/18 0516 09/27/18 0441  NA 135 139 137  K 4.6 3.7 4.6  CL 101 105 104  CO2 '24 25 25  ' GLUCOSE 90 96 103*  BUN '21 18 16  ' CREATININE 1.33* 1.23 1.14  CALCIUM 9.4 8.6* 9.1   GFR: Estimated Creatinine Clearance: 51.7 mL/min (by C-G formula based on SCr of 1.14 mg/dL). Liver Function Tests: Recent Labs  Lab 09/25/18 1349  AST 14*  ALT 15  ALKPHOS 109  BILITOT 0.5  PROT 7.5  ALBUMIN 3.4*   No results for input(s): LIPASE, AMYLASE in the last 168 hours. No results for input(s): AMMONIA in the last 168 hours. Coagulation Profile: No results for input(s): INR, PROTIME in the last 168 hours. Cardiac Enzymes: No results for input(s): CKTOTAL, CKMB, CKMBINDEX, TROPONINI in the last 168 hours. BNP (last 3 results) No results for input(s): PROBNP in the last 8760 hours. HbA1C: No results for input(s): HGBA1C in the last 72 hours. CBG: No results for input(s): GLUCAP in the last 168 hours. Lipid Profile: No results for input(s): CHOL, HDL, LDLCALC, TRIG, CHOLHDL, LDLDIRECT in the last 72 hours. Thyroid Function Tests: No results for input(s): TSH, T4TOTAL, FREET4, T3FREE, THYROIDAB in the last 72 hours. Anemia Panel: No results for input(s): VITAMINB12, FOLATE, FERRITIN, TIBC, IRON, RETICCTPCT in the last 72 hours. Sepsis Labs: Recent Labs  Lab 09/25/18 1550 09/25/18 1737 09/26/18 1402  PROCALCITON  --   --  0.11  LATICACIDVEN 1.2 1.8  --     Recent Results (from the past 240 hour(s))  Culture, blood (routine x 2)     Status: None (Preliminary result)   Collection Time: 09/25/18  3:51 PM  Result Value Ref Range Status   Specimen Description BLOOD RIGHT ANTECUBITAL DRAWN BY RN  Final   Special Requests   Final    BOTTLES DRAWN AEROBIC AND ANAEROBIC Blood Culture adequate volume   Culture   Final    NO GROWTH 2 DAYS Performed at [facility], [address] [place], [place] [address]    Report  Status PENDING  Incomplete  Culture, blood (routine x 2)     Status: None (Preliminary result)   Collection Time: 09/25/18  3:52 PM  Result Value Ref Range Status   Specimen Description BLOOD RIGHT HAND DRAWN BY RN  Final   Special Requests   Final    BOTTLES DRAWN AEROBIC ONLY Blood Culture results may not be optimal due to an inadequate volume of blood received in culture bottles   Culture   Final    NO GROWTH 2 DAYS Performed at [facility], [address], [place], [place] [address]    Report Status PENDING  Incomplete  Urine culture     Status: Abnormal   Collection Time: 09/25/18  5:10 PM  Result Value Ref Range Status   Specimen Description   Final    URINE, CLEAN CATCH Performed at [facility], [address], [place], [place] [address]    Special Requests   Final    NONE Performed at [facility], [address] [place],  [address]    Culture (A)  Final    <10,000 COLONIES/mL INSIGNIFICANT GROWTH Performed at [facility], [address] [place], [place] [address]    Report Status 09/27/2018 FINAL  Final  Respiratory Panel by PCR     Status: None   Collection Time: 09/25/18  7:00 PM  Result Value Ref Range Status   Adenovirus NOT DETECTED NOT DETECTED Final   Coronavirus 229E NOT DETECTED NOT DETECTED Final    Comment: (NOTE) The Coronavirus on the Respiratory Panel, DOES NOT test for the novel  Coronavirus (2019 nCoV)    Coronavirus HKU1 NOT DETECTED NOT DETECTED Final   Coronavirus NL63 NOT DETECTED NOT DETECTED Final   Coronavirus OC43 NOT DETECTED NOT DETECTED Final   Metapneumovirus NOT DETECTED NOT DETECTED Final   Rhinovirus / Enterovirus NOT DETECTED NOT DETECTED Final   Influenza A NOT DETECTED NOT DETECTED Final   Influenza B NOT DETECTED NOT DETECTED Final   Parainfluenza Virus 1 NOT DETECTED NOT DETECTED Final   Parainfluenza Virus 2 NOT DETECTED NOT DETECTED Final   Parainfluenza Virus 3 NOT DETECTED NOT DETECTED Final   Parainfluenza  Virus 4 NOT DETECTED NOT DETECTED Final   Respiratory Syncytial Virus NOT DETECTED NOT DETECTED Final   Bordetella pertussis NOT DETECTED NOT DETECTED Final   Chlamydophila pneumoniae NOT DETECTED NOT DETECTED Final   Mycoplasma pneumoniae NOT DETECTED NOT DETECTED Final    Comment: Performed at [facility], [address], [place], [place] [address]         Radiology Studies: Dg Swallowing Func-speech Pathology  Result Date: 09/27/2018 Objective Swallowing Evaluation: Type of Study: MBS-Modified Barium Swallow Study  Patient Details Name: Kenneth [name] MRN: [id number] Date of Birth: [birth year] Today's Date: 09/27/2018 Time: SLP Start Time (ACUTE ONLY): 1300 -SLP Stop Time (ACUTE ONLY): 1330 SLP Time Calculation (min) (ACUTE ONLY): 30 min Past Medical History: Past Medical History: Diagnosis Date . Anxiety  . Arthritis   Back (05/04/2018) . ASCVD (arteriosclerotic cardiovascular disease)  . Chronic lower back pain   "have it at night" (05/04/2018) . Coronary artery disease   a. 2003: s/p CABG x3V  b. 2007: cath with patent bypass grafts.  c. 09/2016: [place] with cath showing patent bypass grafts, medical Rx recommended . CVA (cerebral vascular accident) ([place]) 10/2017  "little numb on my left face since" (05/04/2018) . Depression  . GERD (gastroesophageal reflux disease)  . High triglycerides  . History of kidney stones  . Hypertension  . Laryngeal carcinoma ([place]) 1998 . Malodorous urine   "in the last 5wks; since I had trach put in" (05/04/2018) . Peripheral vascular disease ([place])  . Stroke [facility])   "he's had several little strokes; many that he wasn't aware of" (05/04/2018) . Tobacco abuse  . Tracheal stenosis  Past Surgical History: Past Surgical History: Procedure Laterality Date . CAROTID STENT Right 06-13-12; 05/04/2018 . CAROTID STENT INSERTION N/A 06/13/2012  Procedure: CAROTID STENT INSERTION;  Surgeon: Serafina Mitchell, MD;  Location: [facility] CATH LAB;  Service: Cardiovascular;  Laterality: N/A; .  CATARACT EXTRACTION, BILATERAL Bilateral  . COLONOSCOPY  11/10/2011  Dr. [name]: hemorrhoids, tubular adenoma . CORNEAL TRANSPLANT Bilateral  . CORONARY ARTERY BYPASS GRAFT  ~ 2003  "CABG X3" . EYE SURGERY   . INSERTION OF RETROGRADE CAROTID STENT Right 05/04/2018  Procedure: INSERTION OF RIGHT  CAROTID STENT using an ABBOT- XACT carotid stent system;  Surgeon: Serafina Mitchell, MD;  Location: [place];  Service: Vascular;  Laterality: Right; . LAPAROSCOPIC CHOLECYSTECTOMY  2007 . LEFT HEART CATH AND CORS/GRAFTS ANGIOGRAPHY N/A 10/07/2016  Procedure: Left Heart Cath and Cors/Grafts  Angiography;  Surgeon: Troy Sine, MD;  Location: [place] CV LAB;  Service: Cardiovascular;  Laterality: N/A; . PARTIAL LARYNGECTOMY  1998 . TRACHEOSTOMY  03/13/2018  "@ [facility]" HPI: Kenneth [name] a 77 y.o.malewith medical history significant fortracheostomy for laryngeal malignancy,HTN,carotid artery stenosis,CVAwho presented to the ED with complaints of difficulty breathing, with cough and fevers over the past 2 days. T-max of 100.1 recorded. Recent hospitalization2/11- 2/13-also for pneumonia,with negative influenza and respiratory viral panel. Patient was started on Levaquin in hospital and discharged home on same. He reports compliance with the medication. Son reports some fevers initially on discharge-for about 2 days, but this resolved. Patient felt clinically better until 2 days ago. 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 He had a FEES at [facility] in 2017 with recommendation for MBSS, however Pt declined at that time. BSE requested.  Subjective: "I don't have any trouble swallowing." Assessment / Plan / Recommendation CHL IP CLINICAL IMPRESSIONS 09/27/2018 Clinical Impression Pt presents with essentially WNL oropharyngeal swallow in setting of partial laryngectomy and trach in place. Pt with swallow trigger at the level of the valleculae across  textures and consistencies with variable flash penetration (underepiglottic coating only) of thins and no aspiration observed. Pt with min vallecular residuals with puree, however Pt cleared with independent repeat swallow. Esophageal sweep was remarkable for delayed emptying in distal esophagus and some retrograde movement noted from LES back into distal esophagus, suspicious for reflux. After results were reviewed with Pt, he tells me that he notices liquids coming back up when he bends over after drinking placing him at risk for post prandial aspiration. Recommend regular textures and thin liquids with reflux precautions- Pt will need benefit of gravity and should sit upright for at least 30 minutes after meals and avoid bending over. No further SLP services indicated at this time.  SLP Visit Diagnosis Dysphagia, unspecified (R13.10) Attention and concentration deficit following -- Frontal lobe and executive function deficit following -- Impact on safety and function Mild aspiration risk   CHL IP TREATMENT RECOMMENDATION 09/27/2018 Treatment Recommendations No treatment recommended at this time   Prognosis 09/27/2018 Prognosis for Safe Diet Advancement Good Barriers to Reach Goals -- Barriers/Prognosis Comment -- CHL IP DIET RECOMMENDATION 09/27/2018 SLP Diet Recommendations Regular solids;Thin liquid Liquid Administration via Cup;Straw Medication Administration Whole meds with liquid Compensations -- Postural Changes Remain semi-upright after after feeds/meals (Comment);Seated upright at 90 degrees   CHL IP OTHER RECOMMENDATIONS 09/27/2018 Recommended Consults -- Oral Care Recommendations Oral care BID;Patient independent with oral care Other Recommendations Clarify dietary restrictions   CHL IP FOLLOW UP RECOMMENDATIONS 09/27/2018 Follow up Recommendations None   CHL IP FREQUENCY AND DURATION 09/26/2018 Speech Therapy Frequency (ACUTE ONLY) min 2x/week Treatment Duration 1 week      CHL IP ORAL PHASE 09/27/2018 Oral Phase  WFL Oral - Pudding Teaspoon -- Oral - Pudding Cup -- Oral - Honey Teaspoon -- Oral - Honey Cup -- Oral - Nectar Teaspoon -- Oral - Nectar Cup -- Oral - Nectar Straw -- Oral - Thin Teaspoon -- Oral - Thin Cup -- Oral - Thin Straw -- Oral - Puree -- Oral - Mech Soft -- Oral - Regular -- Oral - Multi-Consistency -- Oral - Pill -- Oral Phase - Comment --  CHL IP PHARYNGEAL PHASE 09/27/2018 Pharyngeal Phase Impaired Pharyngeal- Pudding Teaspoon -- Pharyngeal -- Pharyngeal- Pudding Cup -- Pharyngeal -- Pharyngeal- Honey Teaspoon -- Pharyngeal -- Pharyngeal- Honey Cup --  Pharyngeal -- Pharyngeal- Nectar Teaspoon -- Pharyngeal -- Pharyngeal- Nectar Cup -- Pharyngeal -- Pharyngeal- Nectar Straw -- Pharyngeal -- Pharyngeal- Thin Teaspoon Delayed swallow initiation-vallecula;Penetration/Aspiration during swallow Pharyngeal Material does not enter airway;Material enters airway, remains ABOVE vocal cords then ejected out Pharyngeal- Thin Cup Delayed swallow initiation-vallecula;WFL Pharyngeal -- Pharyngeal- Thin Straw WFL Pharyngeal -- Pharyngeal- Puree Delayed swallow initiation-vallecula;Pharyngeal residue - valleculae Pharyngeal -- Pharyngeal- Mechanical Soft -- Pharyngeal -- Pharyngeal- Regular Delayed swallow initiation-vallecula;Pharyngeal residue - cp segment Pharyngeal -- Pharyngeal- Multi-consistency -- Pharyngeal -- Pharyngeal- Pill Delayed swallow initiation-vallecula Pharyngeal -- Pharyngeal Comment --  CHL IP CERVICAL ESOPHAGEAL PHASE 09/27/2018 Cervical Esophageal Phase Impaired Pudding Teaspoon -- Pudding Cup -- Honey Teaspoon -- Honey Cup -- Nectar Teaspoon -- Nectar Cup -- Nectar Straw -- Thin Teaspoon -- Thin Cup -- Thin Straw -- Puree -- Mechanical Soft -- Regular -- Multi-consistency -- Pill -- Cervical Esophageal Comment -- Thank you, Genene Churn, Terrell PORTER,DABNEY 09/27/2018, 3:51 PM                   Scheduled Meds: . amLODipine  10 mg Oral q morning - 10a  . aspirin  325 mg Oral  Daily  . clopidogrel  75 mg Oral q morning - 10a  . enoxaparin (LOVENOX) injection  40 mg Subcutaneous Q24H  . ezetimibe  10 mg Oral q morning - 10a  . metoprolol tartrate  25 mg Oral q morning - 10a  . pantoprazole  40 mg Oral Daily  . spironolactone  25 mg Oral q morning - 10a   Continuous Infusions: . linezolid (ZYVOX) IV 600 mg (09/27/18 1147)  . meropenem (MERREM) IV 1 g (09/27/18 1745)     LOS: 2 days      Roxan Hockey, MD [facility]   If 7PM-7AM, please contact night-coverage [email or web address] Password [facility] 09/27/2018, 6:42 PM

## 2018-09-27 NOTE — Progress Notes (Signed)
Modified Barium Swallow Progress Note  Patient Details  Name: Kenneth [name] MRN: [id number] Date of Birth: Oct 20, 1940  Today's Date: 09/27/2018  Modified Barium Swallow completed.  Full report located under Chart Review in the Imaging Section.  Brief recommendations include the following:  Clinical Impression  Pt presents with essentially WNL oropharyngeal swallow in setting of partial laryngectomy and trach in place. Pt with swallow trigger at the level of the valleculae across textures and consistencies with variable flash penetration (underepiglottic coating only) of thins and no aspiration observed. Pt with min vallecular residuals with puree, however Pt cleared with independent repeat swallow. Esophageal sweep was remarkable for delayed emptying in distal esophagus and some retrograde movement noted from LES back into distal esophagus, suspicious for reflux. After results were reviewed with Pt, he tells me that he notices liquids coming back up when he bends over after drinking placing him at risk for post prandial aspiration. Recommend regular textures and thin liquids with reflux precautions- Pt will need benefit of gravity and should sit upright for at least 30 minutes after meals and avoid bending over. No further SLP services indicated at this time.    Swallow Evaluation Recommendations       SLP Diet Recommendations: Regular solids;Thin liquid   Liquid Administration via: Cup;Straw   Medication Administration: Whole meds with liquid   Supervision: Patient able to self feed       Postural Changes: Remain semi-upright after after feeds/meals (Comment);Seated upright at 90 degrees   Oral Care Recommendations: Oral care BID;Patient independent with oral care   Other Recommendations: Clarify dietary restrictions   Thank you,  Genene Churn, [place]  Dorothia Passmore 09/27/2018,3:44 PM

## 2018-09-28 MED ORDER — DOXYCYCLINE HYCLATE 100 MG PO TABS
100.0000 mg | ORAL_TABLET | Freq: Two times a day (BID) | ORAL | Status: DC
  Administered 2018-09-29: 100 mg via ORAL
  Filled 2018-09-28: qty 1

## 2018-09-28 MED ORDER — LINEZOLID 600 MG PO TABS
600.0000 mg | ORAL_TABLET | Freq: Two times a day (BID) | ORAL | Status: AC
  Administered 2018-09-28: 600 mg via ORAL
  Filled 2018-09-28 (×2): qty 1

## 2018-09-28 MED ORDER — CEPHALEXIN 500 MG PO CAPS
500.0000 mg | ORAL_CAPSULE | Freq: Three times a day (TID) | ORAL | Status: DC
  Administered 2018-09-29 (×2): 500 mg via ORAL
  Filled 2018-09-28 (×2): qty 1

## 2018-09-28 NOTE — Progress Notes (Signed)
Pharmacy Antibiotic Note  Kenneth [name] is a 78 y.o. male admitted on 09/25/2018 with pneumonia.  Pharmacy has been consulted for  Merrem dosing. Patient is improving. Afebrile, WBC improved.  MD plans total of 7 days tx.  Plan: Continue Meropenem 1000 mg IV every 12 hours Also on zyvox 600mg  q12h (transitioned to po today) Monitor labs, c/s  Height: 5\' 5"  (165.1 cm) Weight: 179 lb 14.3 oz (81.6 kg) IBW/kg (Calculated) : 61.5  Temp (24hrs), Avg:98 F (36.7 C), Min:97.7 F (36.5 C), Max:98.1 F (36.7 C)  Recent Labs  Lab 09/25/18 1349 09/25/18 1550 09/25/18 1737 09/26/18 0516 09/27/18 0441  WBC 24.0*  --   --  12.9* 13.8*  CREATININE 1.33*  --   --  1.23 1.14  LATICACIDVEN  --  1.2 1.8  --   --     Estimated Creatinine Clearance: 51.7 mL/min (by C-G formula based on SCr of 1.14 mg/dL).    Allergies  Allergen Reactions  . Nifedipine Other (See Comments)  . Lorazepam Other (See Comments)    REACTION: Alters mental status. "Turns into maniac" REACTION: Alters mental status. "Turns into maniac" REACTION: Alters mental status. "Turns into maniac"  . Other     REACTION: Unknown to patient. States that MD states allergy per medical records  . Penicillins Hives    Has patient had a PCN reaction causing immediate rash, facial/tongue/throat swelling, SOB or lightheadedness with hypotension: Yes Has patient had a PCN reaction causing severe rash involving mucus membranes or skin necrosis: No Has patient had a PCN reaction that required hospitalization No Has patient had a PCN reaction occurring within the last 10 years: No If all of the above answers are "NO", then may proceed with Cephalosporin use.  Has patient had a PCN reaction causing immediate rash, facial/tongue/throat swelling, SOB or lightheadedness with hypotension: Yes Has patient had a PCN reaction causing severe rash involving mucus membranes or skin necrosis: No Has patient had a PCN reaction that required  hospitalization No Has patient had a PCN reaction occurring within the last 10 years: No If all of the above answers are "NO", then may proceed with Cephalosporin use.  . Sulfacetamide     REACTION: Unknown to patient. States that MD states allergy per medical records  . Sulfonamide Derivatives Other (See Comments)    REACTION: Unknown to patient. States that MD states allergy per medical records  . Erythromycin Rash  . Statins Itching and Rash  . Vancomycin Itching    Antimicrobials this admission: Vanco 3/2 >> 3/2 Merrem 3/2 >>  Zyvox 3/2>>  Dose adjustments this admission: Meropenem  Microbiology results: 3/2 BCx: ngtd 3/2 UCx: insignificant growth  Thank you for allowing pharmacy to be a part of this patient's care.  Isac Sarna, BS Pharm D, [place] Clinical Pharmacist Pager [phone] 09/28/2018 10:57 AM

## 2018-09-28 NOTE — Progress Notes (Signed)
PROGRESS NOTE    Kenneth [name]  SPQ:[id number] DOB: April 03, 1941 DOA: 09/25/2018 PCP: Kenneth Noble, MD   Brief Narrative:  Per HPI:  Kenneth [name] is a 78 y.o. male with medical history significant for tracheostomy for laryngeal malignancy,HTN,carotid artery stenosis,CVA who presented to the ED with complaints of difficulty breathing, with cough and fevers over the past 2 days.  T-max of 100.1 recorded. Recent hospitalization 2/11- 2/13-also for pneumonia, with negative influenza and respiratory viral panel.  Patient was started on Levaquin in hospital and discharged home on same.  He reports compliance with the medication.  Son reports some fevers initially on discharge-for about 2 days, but this resolved.  Patient felt clinically better until 2 days ago.  No vomiting, reports 1 or 2 loose stools over the past 2 days, no abdominal pain, no burning with urination.  No neck stiffness or pain.  He denies difficulty or problems with swallowing.  Assessment & Plan:   Active Problems:   Pneumonia   1)HCAP -Pneumonia- recent hospitalization-completed course of Levaquin (penicillin allergy).  Shortness of breath, cough, low garde fever, tachypnea with significant leukocytosis persist,  Met sepsis criteria on admission treated with IV meropenem (started 09/25/18) , switch from vancomycin to Zyvox on 09/28/2018 due to concerns about possible vancomycin allergy versus "red man" syndrome .  Respiratory panel negative, strep pneumo and Legionella antigen negative,  urine and blood cultures without growth, procalcitonin 0.11... Patient is clinically improving, okay to de-escalate antibiotics, stop meropenem, stop Zyvox, treat empirically with Keflex and doxycycline starting 09/28/2018  2)FEN--- patient passed a swallow evaluation speech pathologist recommends regular solids and thin liquids  3)Laryngeal cancer, tracheostomy status8/2019-treated forlaryngealcancer 20 years ago with surgery and chemo,  per care- everywhere.Tracheostomy for glottic stenosis,bilateral vocal cord paralysis and dyspnea.  Prior to admission patient on room air with trach collar with 40% humidity , currently on humidified O2 , Gets tracheostomy change every few months at [facility]. -Respiratory therapy consult/input appreciated  4)Hypertension-systolic 333L to 456Y. -Continue home Aldactone, Norvasc, metoprolol -Currently well controlled  5)CVA, Carotidartery stenosis-stable.Has undergone carotid artery stenting. On dual antiplatelet. -Continue home aspirin and Plavix   DVT prophylaxis: Lovenox Code Status: Full Family Communication: None at bedside Disposition Plan: Recurrent hospitalization, requiring IV antibiotics for H CAP  Consultants:   None  Procedures:   None  Antimicrobials:   Linezolid (3/) and Merrem 3/2-> stop    Subjective: Wife at bedside, questions answered... Breathing better, less cough, no fevers, eating okay  Objective: Vitals:   09/27/18 2246 09/28/18 0550 09/28/18 0742 09/28/18 1308  BP: (!) 156/73 (!) 141/74  126/68  Pulse: 69 62  (!) 58  Resp: _0 Temp: 98.1 F (36.7 C) 98.1 F (36.7 C)  98.1 F (36.7 C)  TempSrc: Oral Oral  Oral  SpO2: 95% 95% 94% 98%  Weight:      Height:        Intake/Output Summary (Last 24 hours) at 09/28/2018 1755 Last data filed at 09/28/2018 1300 Gross per 24 hour  Intake 480 ml  Output 700 ml  Net -220 ml   Filed Weights   09/25/18 1329 09/27/18 0500  Weight: 81.6 kg 81.6 kg    Examination:  General exam: Appears calm and comfortable  Respiratory system: Improving air movement, few scattered rhonchi, no wheezing,tracheostomy present.  Requiring humidified oxygen, PTA was on humidified air at home Cardiovascular system: S1 & S2 heard, RRR. No JVD, murmurs, rubs, gallops or clicks. No  pedal edema. Gastrointestinal system: Abdomen is nondistended, soft and nontender. No organomegaly or masses felt.  Normal bowel sounds heard. Central nervous system: Alert and oriented. No focal neurological deficits. Extremities: Symmetric 5 x 5 power. Skin: No rashes, lesions or ulcers Psychiatry: Judgement and insight appear normal. Mood & affect appropriate.   CBC: Recent Labs  Lab 09/25/18 1349 09/26/18 0516 09/27/18 0441  WBC 24.0* 12.9* 13.8*  NEUTROABS 20.9*  --   --   HGB 12.8* 11.3* 11.2*  HCT 41.1 37.1* 37.5*  MCV 89.2 91.2 91.5  PLT 411* 353 330   Basic Metabolic Panel: Recent Labs  Lab 09/25/18 1349 09/26/18 0516 09/27/18 0441  NA 135 139 137  K 4.6 3.7 4.6  CL 101 105 104  CO2 _0 GLUCOSE 90 96 103*  BUN _1 CREATININE 1.33* 1.23 1.14  CALCIUM 9.4 8.6* 9.1   GFR: Estimated Creatinine Clearance: 51.7 mL/min (by C-G formula based on SCr of 1.14 mg/dL). Liver Function Tests: Recent Labs  Lab 09/25/18 1349  AST 14*  ALT 15  ALKPHOS 109  BILITOT 0.5  PROT 7.5  ALBUMIN 3.4*   No results for input(s): LIPASE, AMYLASE in the last 168 hours. No results for input(s): AMMONIA in the last 168 hours. Coagulation Profile: No results for input(s): INR, PROTIME in the last 168 hours. Cardiac Enzymes: No results for input(s): CKTOTAL, CKMB, CKMBINDEX, TROPONINI in the last 168 hours. BNP (last 3 results) No results for input(s): PROBNP in the last 8760 hours. HbA1C: No results for input(s): HGBA1C in the last 72 hours. CBG: No results for input(s): GLUCAP in the last 168 hours. Lipid Profile: No results for input(s): CHOL, HDL, LDLCALC, TRIG, CHOLHDL, LDLDIRECT in the last 72 hours. Thyroid Function Tests: No results for input(s): TSH, T4TOTAL, FREET4, T3FREE, THYROIDAB in the last 72 hours. Anemia Panel: No results for input(s): VITAMINB12, FOLATE, FERRITIN, TIBC, IRON, RETICCTPCT in the last 72 hours. Sepsis Labs: Recent Labs  Lab 09/25/18 1550 09/25/18 1737 09/26/18 1402  PROCALCITON  --   --  0.11  LATICACIDVEN 1.2 1.8  --     Recent Results  (from the past 240 hour(s))  Culture, blood (routine x 2)     Status: None (Preliminary result)   Collection Time: 09/25/18  3:51 PM  Result Value Ref Range Status   Specimen Description BLOOD RIGHT ANTECUBITAL DRAWN BY RN  Final   Special Requests   Final    BOTTLES DRAWN AEROBIC AND ANAEROBIC Blood Culture adequate volume   Culture   Final    NO GROWTH 3 DAYS Performed at [facility], [address], [place], [place] [address]    Report Status PENDING  Incomplete  Culture, blood (routine x 2)     Status: None (Preliminary result)   Collection Time: 09/25/18  3:52 PM  Result Value Ref Range Status   Specimen Description BLOOD RIGHT HAND DRAWN BY RN  Final   Special Requests   Final    BOTTLES DRAWN AEROBIC ONLY Blood Culture results may not be optimal due to an inadequate volume of blood received in culture bottles   Culture   Final    NO GROWTH 3 DAYS Performed at [facility], [address] [place], [place] [address]    Report Status PENDING  Incomplete  Urine culture     Status: Abnormal   Collection Time: 09/25/18  5:10 PM  Result Value Ref Range Status   Specimen Description   Final  URINE, CLEAN CATCH Performed at [facility], [address], [place], [place] [address]    Special Requests   Final    NONE Performed at [facility], [address], [place], [place] [address]    Culture (A)  Final    <10,000 COLONIES/mL INSIGNIFICANT GROWTH Performed at [facility] [address], [place], [place] [address]    Report Status 09/27/2018 FINAL  Final  Respiratory Panel by PCR     Status: None   Collection Time: 09/25/18  7:00 PM  Result Value Ref Range Status   Adenovirus NOT DETECTED NOT DETECTED Final   Coronavirus 229E NOT DETECTED NOT DETECTED Final    Comment: (NOTE) The Coronavirus on the Respiratory Panel, DOES NOT test for the novel  Coronavirus (2019 nCoV)    Coronavirus HKU1 NOT DETECTED NOT DETECTED Final   Coronavirus NL63 NOT DETECTED NOT  DETECTED Final   Coronavirus OC43 NOT DETECTED NOT DETECTED Final   Metapneumovirus NOT DETECTED NOT DETECTED Final   Rhinovirus / Enterovirus NOT DETECTED NOT DETECTED Final   Influenza A NOT DETECTED NOT DETECTED Final   Influenza B NOT DETECTED NOT DETECTED Final   Parainfluenza Virus 1 NOT DETECTED NOT DETECTED Final   Parainfluenza Virus 2 NOT DETECTED NOT DETECTED Final   Parainfluenza Virus 3 NOT DETECTED NOT DETECTED Final   Parainfluenza Virus 4 NOT DETECTED NOT DETECTED Final   Respiratory Syncytial Virus NOT DETECTED NOT DETECTED Final   Bordetella pertussis NOT DETECTED NOT DETECTED Final   Chlamydophila pneumoniae NOT DETECTED NOT DETECTED Final   Mycoplasma pneumoniae NOT DETECTED NOT DETECTED Final    Comment: Performed at [facility], [place] [address], [place], [place] [address]         Radiology Studies: Dg Swallowing Func-speech Pathology  Result Date: 09/27/2018 Objective Swallowing Evaluation: Type of Study: MBS-Modified Barium Swallow Study  Patient Details Name: Kenneth [name] MRN: [id number] Date of Birth: Jun 08, 1941 Today's Date: 09/27/2018 Time: SLP Start Time (ACUTE ONLY): 1300 -SLP Stop Time (ACUTE ONLY): 1330 SLP Time Calculation (min) (ACUTE ONLY): 30 min Past Medical History: Past Medical History: Diagnosis Date . Anxiety  . Arthritis   Back (05/04/2018) . ASCVD (arteriosclerotic cardiovascular disease)  . Chronic lower back pain   "have it at night" (05/04/2018) . Coronary artery disease   a. 2003: s/p CABG x3V  b. 2007: cath with patent bypass grafts.  c. 09/2016: [place] with cath showing patent bypass grafts, medical Rx recommended . CVA (cerebral vascular accident) ([place]) 10/2017  "little numb on my left face since" (05/04/2018) . Depression  . GERD (gastroesophageal reflux disease)  . High triglycerides  . History of kidney stones  . Hypertension  . Laryngeal carcinoma ([place]) 1998 . Malodorous urine   "in the last 5wks; since I had trach put in" (05/04/2018) .  Peripheral vascular disease ([place])  . Stroke [facility])   "he's had several little strokes; many that he wasn't aware of" (05/04/2018) . Tobacco abuse  . Tracheal stenosis  Past Surgical History: Past Surgical History: Procedure Laterality Date . CAROTID STENT Right 06-13-12; 05/04/2018 . CAROTID STENT INSERTION N/A 06/13/2012  Procedure: CAROTID STENT INSERTION;  Surgeon: Serafina Mitchell, MD;  Location: [facility] CATH LAB;  Service: Cardiovascular;  Laterality: N/A; . CATARACT EXTRACTION, BILATERAL Bilateral  . COLONOSCOPY  11/10/2011  Dr. [name]: hemorrhoids, tubular adenoma . CORNEAL TRANSPLANT Bilateral  . CORONARY ARTERY BYPASS GRAFT  ~ 2003  "CABG X3" . EYE SURGERY   . INSERTION  OF RETROGRADE CAROTID STENT Right 05/04/2018  Procedure: INSERTION OF RIGHT  CAROTID STENT using an ABBOT- XACT carotid stent system;  Surgeon: Serafina Mitchell, MD;  Location: [place];  Service: Vascular;  Laterality: Right; . LAPAROSCOPIC CHOLECYSTECTOMY  2007 . LEFT HEART CATH AND CORS/GRAFTS ANGIOGRAPHY N/A 10/07/2016  Procedure: Left Heart Cath and Cors/Grafts Angiography;  Surgeon: Troy Sine, MD;  Location: [facility] CV LAB;  Service: Cardiovascular;  Laterality: N/A; . PARTIAL LARYNGECTOMY  1998 . TRACHEOSTOMY  03/13/2018  "@ [facility]" HPI: Kenneth [name] a 77 y.o.malewith medical history significant fortracheostomy for laryngeal malignancy,HTN,carotid artery stenosis,CVAwho presented to the ED with complaints of difficulty breathing, with cough and fevers over the past 2 days. T-max of 100.1 recorded. Recent hospitalization2/11- 2/13-also for pneumonia,with negative influenza and respiratory viral panel. Patient was started on Levaquin in hospital and discharged home on same. He reports compliance with the medication. Son reports some fevers initially on discharge-for about 2 days, but this resolved. Patient felt clinically better until 2 days ago. 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 He had a FEES at [facility] in 2017 with recommendation for MBSS, however Pt declined at that time. BSE requested.  Subjective: "I don't have any trouble swallowing." Assessment / Plan / Recommendation CHL IP CLINICAL IMPRESSIONS 09/27/2018 Clinical Impression Pt presents with essentially WNL oropharyngeal swallow in setting of partial laryngectomy and trach in place. Pt with swallow trigger at the level of the valleculae across textures and consistencies with variable flash penetration (underepiglottic coating only) of thins and no aspiration observed. Pt with min vallecular residuals with puree, however Pt cleared with independent repeat swallow. Esophageal sweep was remarkable for delayed emptying in distal esophagus and some retrograde movement noted from LES back into distal esophagus, suspicious for reflux. After results were reviewed with Pt, he tells me that he notices liquids coming back up when he bends over after drinking placing him at risk for post prandial aspiration. Recommend regular textures and thin liquids with reflux precautions- Pt will need benefit of gravity and should sit upright for at least 30 minutes after meals and avoid bending over. No further SLP services indicated at this time.  SLP Visit Diagnosis Dysphagia, unspecified (R13.10) Attention and concentration deficit following -- Frontal lobe and executive function deficit following -- Impact on safety and function Mild aspiration risk   CHL IP TREATMENT RECOMMENDATION 09/27/2018 Treatment Recommendations No treatment recommended at this time   Prognosis 09/27/2018 Prognosis for Safe Diet Advancement Good Barriers to Reach Goals -- Barriers/Prognosis Comment -- CHL IP DIET RECOMMENDATION 09/27/2018 SLP Diet Recommendations Regular solids;Thin liquid Liquid Administration via Cup;Straw Medication Administration Whole meds with liquid Compensations -- Postural Changes Remain semi-upright after after feeds/meals  (Comment);Seated upright at 90 degrees   CHL IP OTHER RECOMMENDATIONS 09/27/2018 Recommended Consults -- Oral Care Recommendations Oral care BID;Patient independent with oral care Other Recommendations Clarify dietary restrictions   CHL IP FOLLOW UP RECOMMENDATIONS 09/27/2018 Follow up Recommendations None   CHL IP FREQUENCY AND DURATION 09/26/2018 Speech Therapy Frequency (ACUTE ONLY) min 2x/week Treatment Duration 1 week      CHL IP ORAL PHASE 09/27/2018 Oral Phase WFL Oral - Pudding Teaspoon -- Oral - Pudding Cup -- Oral - Honey Teaspoon -- Oral - Honey Cup -- Oral - Nectar Teaspoon -- Oral - Nectar Cup -- Oral - Nectar Straw -- Oral - Thin Teaspoon -- Oral - Thin Cup -- Oral - Thin Straw --  Oral - Puree -- Oral - Mech Soft -- Oral - Regular -- Oral - Multi-Consistency -- Oral - Pill -- Oral Phase - Comment --  CHL IP PHARYNGEAL PHASE 09/27/2018 Pharyngeal Phase Impaired Pharyngeal- Pudding Teaspoon -- Pharyngeal -- Pharyngeal- Pudding Cup -- Pharyngeal -- Pharyngeal- Honey Teaspoon -- Pharyngeal -- Pharyngeal- Honey Cup -- Pharyngeal -- Pharyngeal- Nectar Teaspoon -- Pharyngeal -- Pharyngeal- Nectar Cup -- Pharyngeal -- Pharyngeal- Nectar Straw -- Pharyngeal -- Pharyngeal- Thin Teaspoon Delayed swallow initiation-vallecula;Penetration/Aspiration during swallow Pharyngeal Material does not enter airway;Material enters airway, remains ABOVE vocal cords then ejected out Pharyngeal- Thin Cup Delayed swallow initiation-vallecula;WFL Pharyngeal -- Pharyngeal- Thin Straw WFL Pharyngeal -- Pharyngeal- Puree Delayed swallow initiation-vallecula;Pharyngeal residue - valleculae Pharyngeal -- Pharyngeal- Mechanical Soft -- Pharyngeal -- Pharyngeal- Regular Delayed swallow initiation-vallecula;Pharyngeal residue - cp segment Pharyngeal -- Pharyngeal- Multi-consistency -- Pharyngeal -- Pharyngeal- Pill Delayed swallow initiation-vallecula Pharyngeal -- Pharyngeal Comment --  CHL IP CERVICAL ESOPHAGEAL PHASE 09/27/2018 Cervical Esophageal  Phase Impaired Pudding Teaspoon -- Pudding Cup -- Honey Teaspoon -- Honey Cup -- Nectar Teaspoon -- Nectar Cup -- Nectar Straw -- Thin Teaspoon -- Thin Cup -- Thin Straw -- Puree -- Mechanical Soft -- Regular -- Multi-consistency -- Pill -- Cervical Esophageal Comment -- Thank you, Genene Churn, Geraldine PORTER,DABNEY 09/27/2018, 3:51 PM                 Scheduled Meds: . amLODipine  10 mg Oral q morning - 10a  . aspirin  325 mg Oral Daily  . clopidogrel  75 mg Oral q morning - 10a  . enoxaparin (LOVENOX) injection  40 mg Subcutaneous Q24H  . ezetimibe  10 mg Oral q morning - 10a  . linezolid  600 mg Oral Q12H  . metoprolol tartrate  25 mg Oral q morning - 10a  . pantoprazole  40 mg Oral Daily  . spironolactone  25 mg Oral q morning - 10a   Continuous Infusions: . meropenem (MERREM) IV 1 g (09/28/18 0601)     LOS: 3 days    Roxan Hockey, MD [facility]   If 7PM-7AM, please contact night-coverage [email or web address] Password [facility] 09/28/2018, 5:55 PM

## 2018-09-29 ENCOUNTER — Inpatient Hospital Stay (HOSPITAL_COMMUNITY): Payer: Medicare HMO

## 2018-09-29 LAB — BASIC METABOLIC PANEL
Anion gap: 10 (ref 5–15)
BUN: 13 mg/dL (ref 8–23)
CO2: 25 mmol/L (ref 22–32)
Calcium: 9.2 mg/dL (ref 8.9–10.3)
Chloride: 101 mmol/L (ref 98–111)
Creatinine, Ser: 1.26 mg/dL — ABNORMAL HIGH (ref 0.61–1.24)
GFR calc Af Amer: >60 mL/min (ref >60)
GFR calc non Af Amer: 54 mL/min — ABNORMAL LOW (ref >60)
Glucose, Bld: 112 mg/dL — ABNORMAL HIGH (ref 70–99)
Potassium: 4.2 mmol/L (ref 3.5–5.1)
Sodium: 136 mmol/L (ref 135–145)

## 2018-09-29 LAB — CBC
HCT: 40.3 % (ref 39.0–52.0)
Hemoglobin: 12.3 g/dL — ABNORMAL LOW (ref 13.0–17.0)
MCH: 27.3 pg (ref 26.0–34.0)
MCHC: 30.5 g/dL (ref 30.0–36.0)
MCV: 89.6 fL (ref 80.0–100.0)
Platelets: 360 10*3/uL (ref 150–400)
RBC: 4.5 MIL/uL (ref 4.22–5.81)
RDW: 14.8 % (ref 11.5–15.5)
WBC: 12.5 10*3/uL — ABNORMAL HIGH (ref 4.0–10.5)
nRBC: 0 % (ref 0.0–0.2)

## 2018-09-29 MED ORDER — ALBUTEROL SULFATE (2.5 MG/3ML) 0.083% IN NEBU: 2.5000 mg | INHALATION_SOLUTION | Freq: Three times a day (TID) | RESPIRATORY_TRACT | 12 refills | Status: DC

## 2018-09-29 MED ORDER — CEPHALEXIN 500 MG PO CAPS: 500.0000 mg | ORAL_CAPSULE | Freq: Three times a day (TID) | ORAL | 0 refills | Status: AC

## 2018-09-29 MED ORDER — DOXYCYCLINE HYCLATE 100 MG PO TABS: 100.0000 mg | ORAL_TABLET | Freq: Two times a day (BID) | ORAL | 0 refills | Status: AC

## 2018-09-29 MED ORDER — ACETAMINOPHEN 325 MG PO TABS: 650.0000 mg | ORAL_TABLET | Freq: Four times a day (QID) | ORAL | 0 refills | Status: DC | PRN

## 2018-09-29 MED ORDER — GUAIFENESIN ER 600 MG PO TB12: 600.0000 mg | ORAL_TABLET | Freq: Two times a day (BID) | ORAL | 0 refills | Status: AC

## 2018-09-29 NOTE — Care Management Note (Signed)
Case Management Note  Patient Details  Name: Kenneth [name] MRN: [id number] Date of Birth: 1940/09/27  Subjective/Objective:   From home with pneumonia. Pt lives with wife, ind with ADL;s. Has trach and uses humidified are at home, not O2. Pt has used [facility] in the past. Wife would like HH to provide hospital f/u. CM provided CMS provider options.           Action/Plan: DC home today with HH. Vaughan Basta, [facility] rep, given referral.   Expected Discharge Date:     09/29/2018             Expected Discharge Plan:  [place]  In-House Referral:  NA  Discharge planning Services  CM Consult  Post Acute Care Choice:  Home Health Choice offered to:  Patient  HH Arranged:  RN [facility] Agency:  [place] ([facility])  Status of Service:  Completed, signed off  Sherald Barge, RN 09/29/2018, 12:30 PM

## 2018-09-29 NOTE — Discharge Instructions (Signed)
1) use albuterol nebulizer treatments as prescribed 2) Take antibiotics as prescribed 3) follow-up with your primary care physician for recheck within 1 week

## 2018-09-29 NOTE — Care Management Important Message (Signed)
Important Message  Patient Details  Name: Kenneth [name] MRN: [id number] Date of Birth: 03-Mar-1941   Medicare Important Message Given:  Yes    Sherald Barge, RN 09/29/2018, 12:28 PM

## 2018-09-29 NOTE — Care Management (Signed)
Pt needs neb machine, would like to use [facility]. Juliann Pulse, [employer] rep, given referral and will deliver machine to pt room prior to DC.

## 2018-09-29 NOTE — Discharge Summary (Signed)
Kenneth [name], is a 78 y.o. male  DOB 03-29-1941  MRN [id number].  Admission date:  09/25/2018  Admitting Physician  Bethena Roys, MD  Discharge Date:  09/29/2018   Primary MD  [name], MD  Recommendations for primary care physician for things to follow:   1) use albuterol nebulizer treatments as prescribed 2) Take antibiotics as prescribed 3) follow-up with your primary care physician for recheck within 1 week  Admission Diagnosis  HCAP (healthcare-associated pneumonia) [J18.9] Allergic reaction due to antibacterial drug [T36.95XA]   Discharge Diagnosis  HCAP (healthcare-associated pneumonia) [J18.9] Allergic reaction due to antibacterial drug [T36.95XA]    Active Problems:   Pneumonia      Past Medical History:  Diagnosis Date  . Anxiety   . 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 ASCVD (arteriosclerotic cardiovascular disease)   . Chronic lower back pain    "have it at night" (05/04/2018)  . Coronary artery disease    a. 2003: s/p CABG x3V  b. 2007: cath with patent bypass grafts.  c. 09/2016: [place] with cath showing patent bypass grafts, medical Rx recommended  . CVA (cerebral vascular accident) ([place]) 10/2017   "little numb on my left face since" (05/04/2018)  . Depression   . GERD (gastroesophageal reflux disease)   . High triglycerides   . History of kidney stones   . Hypertension   . 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 Malodorous urine    "in the last 5wks; since I had trach put in" (05/04/2018)  . Peripheral vascular disease ([place])   . Stroke [facility])    "he's had several little strokes; many that he wasn't aware of" (05/04/2018)  . Tobacco abuse   . Tracheal stenosis     Past Surgical History:  Procedure Laterality Date  . CAROTID STENT Right 06-13-12; 05/04/2018  . CAROTID STENT INSERTION N/A 06/13/2012   Procedure: CAROTID STENT INSERTION;  Surgeon: Serafina Mitchell, MD;   Location: [facility] CATH LAB;  Service: Cardiovascular;  Laterality: N/A;  . CATARACT EXTRACTION, BILATERAL Bilateral   . COLONOSCOPY  11/10/2011   Dr. [name]: hemorrhoids, tubular adenoma  . CORNEAL TRANSPLANT Bilateral   . CORONARY ARTERY BYPASS GRAFT  ~ 2003   "CABG X3"  . EYE SURGERY    . INSERTION OF RETROGRADE CAROTID STENT Right 05/04/2018   Procedure: INSERTION OF RIGHT  CAROTID STENT using an ABBOT- XACT carotid stent system;  Surgeon: Serafina Mitchell, MD;  Location: [place];  Service: Vascular;  Laterality: Right;  . LAPAROSCOPIC CHOLECYSTECTOMY  2007  . LEFT HEART CATH AND CORS/GRAFTS ANGIOGRAPHY N/A 10/07/2016   Procedure: Left Heart Cath and Cors/Grafts Angiography;  Surgeon: Troy Sine, MD;  Location: [place] CV LAB;  Service: Cardiovascular;  Laterality: N/A;  . PARTIAL LARYNGECTOMY  1998  . TRACHEOSTOMY  03/13/2018   "@ [facility]"       HPI  from the history and physical done on the day of admission:    Chief Complaint: SOB, Cough, fevers  HPI:  Kenneth [name] is a 78 y.o. male with medical history significant for tracheostomy for laryngeal malignancy,HTN,carotid artery stenosis,CVA who presented to the ED with complaints of difficulty breathing, with cough and fevers over the past 2 days.  T-max of 100.1 recorded. Recent hospitalization 2/11- 2/13-also for pneumonia, with negative influenza and respiratory viral panel.  Patient was started on Levaquin in hospital and discharged home on same.  He reports compliance with the medication.  Son reports some fevers initially on discharge-for about 2 days, but this resolved.  Patient felt clinically better until 2 days ago.  No vomiting, reports 1 or 2 loose stools over the past 2 days, no abdominal pain, no burning with urination.  No neck stiffness or pain.  He denies difficulty or problems with swallowing.  ED Course: Max 100.1, intermittent tachypnea.  O2 sats greater than 91% on room air.  The 24.  Lactic acid 1.2.   Chest CT showed multifocal patchy groundglass attenuation airspace opacities throughout both upper lobes and superior segments of the lower lobes concerning for multifocal infectious or inflammatory process.  Multifocal pneumonia fever.  Differentials include atypical or viral pneumonia.  Patient was started on IV vancomycin and meropenem in the ED.  While vancomycin was infusing patient suddenly started itching his head, developed splotchiness/redness to his forehead and cheeks.  After vancomycin was discontinued symptoms resolved about 30 to 40 minutes later.  Benadryl was given.  Son was present at bedside.    Hospital Course:   Brief Narrative:  Per HPI:  Kenneth [name] a 78 y.o.malewith medical history significant fortracheostomy for laryngeal malignancy,HTN,carotid artery stenosis,CVAwho presented to the ED with complaints of difficulty breathing, with cough and fevers over the past 2 days. T-max of 100.1 recorded. Recent hospitalization2/11- 2/13-also for pneumonia,with negative influenza and respiratory viral panel. Patient was started on Levaquin in hospital and discharged home on same. He reports compliance with the medication. Son reports some fevers initially on discharge-for about 2 days, but this resolved. Patient felt clinically better until 2 days ago.  No vomiting, reports 1 or 2 loose stools over the past 2 days, no abdominal pain, no burning with urination. No neck stiffness or pain. He denies difficulty or problems with swallowing.  Assessment & Plan:   Active Problems:   Pneumonia   1)HCAP -Pneumonia-recent hospitalization-completed course of Levaquin(penicillin allergy). Patient was admitted with shortness of breath, cough, low gardefever,tachypnea with significant leukocytosis, Met sepsis criteria on admission treated with IV meropenem (started 09/25/18) , switch from vancomycin to Zyvox on 09/28/2018 due to concerns about possible vancomycin  allergy versus "red man" syndrome .  Respiratory panel negative, strep pneumo and Legionella antigen negative,  urine and blood cultures without growth, procalcitonin 0.11...   Patient  improved clinically, given significant clinical improvement as well as lab and imaging findings including negative cultures and procalcitonin of 0.11 we de-escalated antibiotics, stopped meropenem, stopped Zyvox, treated with Keflex and doxycycline starting 09/28/2018  2)FEN--- patient passed a swallow evaluation speech pathologist recommends regular solids and thin liquids  3)Laryngeal cancer, tracheostomy status8/2019-treated forlaryngealcancer 20 years ago with surgery and chemo, per care- everywhere.Tracheostomy for glottic stenosis,bilateral vocal cord paralysis and dyspnea.  Prior to admission patient on room air with trach collar with 40% humidity , patient was weaned off oxygen, doing well on room air, gets tracheostomy change every few months at [facility]. -Respiratory therapy consult/input appreciated  4)Hypertension-systolic 696E to 952W. -Continue home Aldactone, Norvasc, metoprolol -Currently well controlled  5)CVA, Carotidartery stenosis-stable.Has  undergone carotid artery stenting. On dual antiplatelet. -Continue home aspirin and Plavix  DVT prophylaxis: Lovenox Code Status: Full Family Communication:  Wife at bedside Disposition Plan:  Home  Consultants:   None  Procedures:   None  Antimicrobials:   Linezolid (3/) and Merrem 3/2-> stop   Discharge Condition: stable  Follow UP  Follow-up Information    [facility].   Specialty:  Home Health Services          Diet and Activity recommendation:  As advised  Discharge Instructions     Discharge Instructions    Call MD for:  difficulty breathing, headache or visual disturbances   Complete by:  As directed    Call MD for:  persistant dizziness or light-headedness    Complete by:  As directed    Call MD for:  persistant nausea and vomiting   Complete by:  As directed    Call MD for:  severe uncontrolled pain   Complete by:  As directed    Call MD for:  temperature >100.4   Complete by:  As directed    Diet - low sodium heart healthy   Complete by:  As directed    Discharge instructions   Complete by:  As directed    1) use albuterol nebulizer treatments as prescribed 2) Take antibiotics as prescribed 3) follow-up with your primary care physician for recheck within 1 week   Increase activity slowly   Complete by:  As directed         Discharge Medications     Allergies as of 09/29/2018      Reactions   Nifedipine Other (See Comments)   Lorazepam Other (See Comments)   REACTION: Alters mental status. "Turns into maniac" REACTION: Alters mental status. "Turns into maniac" REACTION: Alters mental status. "Turns into maniac"   Other    REACTION: Unknown to patient. States that MD states allergy per medical records   Penicillins Hives   Has patient had a PCN reaction causing immediate rash, facial/tongue/throat swelling, SOB or lightheadedness with hypotension: Yes Has patient had a PCN reaction causing severe rash involving mucus membranes or skin necrosis: No Has patient had a PCN reaction that required hospitalization No Has patient had a PCN reaction occurring within the last 10 years: No If all of the above answers are "NO", then may proceed with Cephalosporin use. Has patient had a PCN reaction causing immediate rash, facial/tongue/throat swelling, SOB or lightheadedness with hypotension: Yes Has patient had a PCN reaction causing severe rash involving mucus membranes or skin necrosis: No Has patient had a PCN reaction that required hospitalization No Has patient had a PCN reaction occurring within the last 10 years: No If all of the above answers are "NO", then may proceed with Cephalosporin use.   Sulfacetamide    REACTION: Unknown to  patient. States that MD states allergy per medical records   Sulfonamide Derivatives Other (See Comments)   REACTION: Unknown to patient. States that MD states allergy per medical records   Erythromycin Rash   Statins Itching, Rash   Vancomycin Itching      Medication List    STOP taking these medications   levofloxacin 750 MG tablet Commonly known as:  LEVAQUIN     TAKE these medications   acetaminophen 325 MG tablet Commonly known as:  TYLENOL Take 2 tablets (650 mg total) by mouth every 6 (six) hours as needed for mild pain (or Fever >/= 101). What changed:  medication strength  how much to take  when to take this  reasons to take this   albuterol (2.5 MG/3ML) 0.083% nebulizer solution Commonly known as:  PROVENTIL Take 3 mLs (2.5 mg total) by nebulization 3 (three) times daily. X 5 days and then q 6 hrs prn after that   amLODipine 10 MG tablet Commonly known as:  NORVASC Take 1 tablet (10 mg total) by mouth daily. What changed:  when to take this   aspirin 325 MG tablet Take 1 tablet (325 mg total) by mouth daily.   cephALEXin 500 MG capsule Commonly known as:  KEFLEX Take 1 capsule (500 mg total) by mouth 3 (three) times daily for 5 days.   clopidogrel 75 MG tablet Commonly known as:  PLAVIX Take 1 tablet (75 mg total) by mouth daily. What changed:  when to take this   doxycycline 100 MG tablet Commonly known as:  VIBRA-TABS Take 1 tablet (100 mg total) by mouth 2 (two) times daily for 5 days.   ezetimibe 10 MG tablet Commonly known as:  ZETIA Take 1 tablet (10 mg total) by mouth daily. What changed:  when to take this   guaiFENesin 600 MG 12 hr tablet Commonly known as:  Mucinex Take 1 tablet (600 mg total) by mouth 2 (two) times daily for 10 days.   metoprolol tartrate 25 MG tablet Commonly known as:  LOPRESSOR Take 25 mg by mouth every morning.   nitroGLYCERIN 0.4 MG SL tablet Commonly known as:  Nitrostat Place 1 tablet (0.4 mg total)  under the tongue every 5 (five) minutes as needed for chest pain.   omeprazole 20 MG capsule Commonly known as:  PRILOSEC Take 20 mg by mouth every morning.   PARoxetine 20 MG tablet Commonly known as:  PAXIL Take 20 mg by mouth at bedtime.   spironolactone 25 MG tablet Commonly known as:  ALDACTONE Take 25 mg by mouth every morning.            Durable Medical Equipment  (From admission, onward)         Start     Ordered   09/29/18 1453  For home use only DME Nebulizer machine  Once    Question:  Patient needs a nebulizer to treat with the following condition  Answer:  COPD (chronic obstructive pulmonary disease) ([place])   09/29/18 1452          Major procedures and Radiology Reports - PLEASE review detailed and final reports for all details, in brief -   Dg Chest 2 View  Result Date: 09/29/2018 CLINICAL DATA:  Short of breath EXAM: CHEST - 2 VIEW COMPARISON:  09/25/2018 FINDINGS: Tracheostomy tube is stable. Normal heart size. Postoperative changes in the mediastinum. Low volumes. Bilateral patchy airspace opacities are stable. No pneumothorax. No pleural effusions. IMPRESSION: Stable bilateral patchy airspace opacities. Electronically Signed   By: Marybelle Killings M.D.   On: 09/29/2018 09:42   Dg Chest 2 View  Result Date: 09/25/2018 CLINICAL DATA:  PATIENT DISCHARGED FROM [facility] 3 WEEKS AGO AND TREATED FOR PNEUMONIA. PATIENT CONTINUES TO HAVE COUGH, SOB, AND FEVER SINCE DISCHARGE. HX: QUIT SMOKING 70 YEARS AGO, HAS A TRACHEOSTOMY TUBE DUE TO CANCER OF LARYNX, PNEUMONIA, CABG, HTN. EXAM: CHEST - 2 VIEW COMPARISON:  To 06/15/2019 FINDINGS: Tracheostomy in place. Patient has had median sternotomy and CABG. The heart is normal in size. Significant persistent patchy opacities bilaterally, possibly more confluent at the RIGHT lung base. There is no  pleural effusion. No pulmonary edema. IMPRESSION: Persistent bilateral pulmonary infiltrates representing a significant change  from studies in late 2019. Consider further evaluation CT of the chest. Electronically Signed   By: Nolon Nations M.D.   On: 09/25/2018 14:29   Dg Chest 2 View  Result Date: 09/05/2018 CLINICAL DATA:  Patient has a trach and has had fever x 1 week.--- PCP advised to come to ER-- coughing up yellow/green sputum through trach. sob on exertion, hx throat ca EXAM: CHEST - 2 VIEW COMPARISON:  06/10/2018 FINDINGS: Heart size is normal. Status post median sternotomy and CABG. Elevation of the RIGHT hemidiaphragm is stable. Interval development of significant but patchy interstitial opacities bilaterally, RIGHT greater than LEFT. No frank consolidations. No pleural effusions or edema. Degenerative changes are seen in thoracic spine. IMPRESSION: Interval development of significant interstitial opacities bilaterally, RIGHT greater than LEFT. Findings are consistent with pneumonitis. Electronically Signed   By: Nolon Nations M.D.   On: 09/05/2018 15:57   Ct Chest W Contrast  Result Date: 09/25/2018 CLINICAL DATA:  78 year old male with a history of interstitial lung disease currently with shortness of breath on exertion, generalized weakness, low-grade fever and cough for the past 4 days. EXAM: CT CHEST WITH CONTRAST TECHNIQUE: Multidetector CT imaging of the chest was performed during intravenous contrast administration. CONTRAST:  80m OMNIPAQUE IOHEXOL 300 MG/ML  SOLN COMPARISON:  Chest x-ray obtained earlier today; prior CT scan of the chest 06/08/2017 FINDINGS: Cardiovascular: Incompletely visualized stent in the right common carotid artery. Patient is status post median sternotomy with evidence of prior multivessel CABG. Borderline cardiomegaly. No pericardial effusion. Conventional 3 vessel arch anatomy. No evidence of aneurysm. Mediastinum/Nodes: Tracheostomy tube. The tip of the tube is identified in the mid trachea. No suspicious mediastinal or hilar adenopathy. The esophagus is unremarkable.  Lungs/Pleura: Multifocal patchy airspace opacities noted in the bilateral upper lobes slightly worse on the right than the left. Minimal patchy airspace disease also present within the superior segments of both lower lobes. Mild paraseptal pulmonary emphysema. Diffuse mild bronchial wall thickening. Areas of chronic linear scarring visualized in the right middle lobe and lingula. No pleural effusion or pneumothorax. Upper Abdomen: No acute abnormality. Surgical changes of prior cholecystectomy. Musculoskeletal: No acute fracture or aggressive appearing lytic or blastic osseous lesion. Healed median sternotomy. IMPRESSION: 1. Multifocal patchy ground-glass attenuation airspace opacities throughout both upper lobes and the superior segments of the lower lobes concerning for a multifocal infectious or inflammatory process. Multifocal pneumonia is favored. Differential consideration should include atypical and viral pneumonia. 2. Aortic Atherosclerosis (ICD10-I70.0) and Emphysema (ICD10-J43.9). 3. Well-positioned tracheostomy tube. Electronically Signed   By: HJacqulynn CadetM.D.   On: 09/25/2018 17:25   Dg Swallowing Func-speech Pathology  Result Date: 09/27/2018 Objective Swallowing Evaluation: Type of Study: MBS-Modified Barium Swallow Study  Patient Details Name: Kenneth BERENDTMRN: [id number] of Birth: [birth year]Today's Date: 09/27/2018 Time: SLP Start Time (ACUTE ONLY): 1300 -SLP Stop Time (ACUTE ONLY): 1330 SLP Time Calculation (min) (ACUTE ONLY): 30 min Past Medical History: Past Medical History: Diagnosis Date . Anxiety  . Arthritis   Back (05/04/2018) . ASCVD (arteriosclerotic cardiovascular disease)  . Chronic lower back pain   "have it at night" (05/04/2018) . Coronary artery disease   a. 2003: s/p CABG x3V  b. 2007: cath with patent bypass grafts.  [place] medical [place] . [place]  "little numb on my left  face  since" (05/04/2018) . Depression  . GERD (gastroesophageal reflux disease)  . High triglycerides  . History of kidney stones  . Hypertension  . 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 Malodorous urine   "in the last 5wks; since I had trach put in" (05/04/2018) . Peripheral vascular disease ([place])  . Stroke [facility])   "he's had several little strokes; many that he wasn't aware of" (05/04/2018) . Tobacco abuse  . Tracheal stenosis  Past Surgical History: Past Surgical History: Procedure Laterality Date . CAROTID STENT Right 06-13-12; 05/04/2018 . CAROTID STENT INSERTION N/A 06/13/2012  Procedure: CAROTID STENT INSERTION;  Surgeon: Serafina Mitchell, MD;  Location: [facility] CATH LAB;  Service: Cardiovascular;  Laterality: N/A; . CATARACT EXTRACTION, BILATERAL Bilateral  . COLONOSCOPY  11/10/2011  Dr. [name]: hemorrhoids, tubular adenoma . CORNEAL TRANSPLANT Bilateral  . CORONARY ARTERY BYPASS GRAFT  ~ 2003  "CABG X3" . EYE SURGERY   . INSERTION OF RETROGRADE CAROTID STENT Right 05/04/2018  Procedure: INSERTION OF RIGHT  CAROTID STENT using an ABBOT- XACT carotid stent system;  Surgeon: Serafina Mitchell, MD;  Location: [place];  Service: Vascular;  Laterality: Right; . LAPAROSCOPIC CHOLECYSTECTOMY  2007 . LEFT HEART CATH AND CORS/GRAFTS ANGIOGRAPHY N/A 10/07/2016  Procedure: Left Heart Cath and Cors/Grafts Angiography;  Surgeon: Troy Sine, MD;  Location: [place] CV LAB;  Service: Cardiovascular;  Laterality: N/A; . PARTIAL LARYNGECTOMY  1998 . TRACHEOSTOMY  03/13/2018  "@ [facility]" HPI: Kenneth [name] a 77 y.o.malewith medical history significant fortracheostomy for laryngeal malignancy,HTN,carotid artery stenosis,CVAwho presented to the ED with complaints of difficulty breathing, with cough and fevers over the past 2 days. T-max of 100.1 recorded. Recent hospitalization2/11- 2/13-also for pneumonia,with negative influenza and respiratory viral panel. Patient was started on Levaquin in hospital and discharged home  on same. He reports compliance with the medication. Son reports some fevers initially on discharge-for about 2 days, but this resolved. Patient felt clinically better until 2 days ago. 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 He had a FEES [facility] in 2017 with recommendation for MBSS, however Pt declined at that time. BSE requested.  Subjective: "I don't have any trouble swallowing." Assessment / Plan / Recommendation CHL IP CLINICAL IMPRESSIONS 09/27/2018 Clinical Impression Pt presents with essentially WNL oropharyngeal swallow in setting of partial laryngectomy and trach in place. Pt with swallow trigger at the level of the valleculae across textures and consistencies with variable flash penetration (underepiglottic coating only) of thins and no aspiration observed. Pt with min vallecular residuals with puree, however Pt cleared with independent repeat swallow. Esophageal sweep was remarkable for delayed emptying in distal esophagus and some retrograde movement noted from LES back into distal esophagus, suspicious for reflux. After results were reviewed with Pt, he tells me that he notices liquids coming back up when he bends over after drinking placing him at risk for post prandial aspiration. Recommend regular textures and thin liquids with reflux precautions- Pt will need benefit of gravity and should sit upright for at least 30 minutes after meals and avoid bending over. No further SLP services indicated at this time.  SLP Visit Diagnosis Dysphagia, unspecified (R13.10) Attention and concentration deficit following -- Frontal lobe and executive function deficit following -- Impact on safety and function Mild aspiration risk   CHL IP TREATMENT RECOMMENDATION 09/27/2018 Treatment Recommendations No treatment recommended at this time   Prognosis 09/27/2018 Prognosis for Safe Diet Advancement Good Barriers to Reach Goals --  Barriers/Prognosis  Comment -- CHL IP DIET RECOMMENDATION 09/27/2018 SLP Diet Recommendations Regular solids;Thin liquid Liquid Administration via Cup;Straw Medication Administration Whole meds with liquid Compensations -- Postural Changes Remain semi-upright after after feeds/meals (Comment);Seated upright at 90 degrees   CHL IP OTHER RECOMMENDATIONS 09/27/2018 Recommended Consults -- Oral Care Recommendations Oral care BID;Patient independent with oral care Other Recommendations Clarify dietary restrictions   CHL IP FOLLOW UP RECOMMENDATIONS 09/27/2018 Follow up Recommendations None   CHL IP FREQUENCY AND DURATION 09/26/2018 Speech Therapy Frequency (ACUTE ONLY) min 2x/week Treatment Duration 1 week      CHL IP ORAL PHASE 09/27/2018 Oral Phase WFL Oral - Pudding Teaspoon -- Oral - Pudding Cup -- Oral - Honey Teaspoon -- Oral - Honey Cup -- Oral - Nectar Teaspoon -- Oral - Nectar Cup -- Oral - Nectar Straw -- Oral - Thin Teaspoon -- Oral - Thin Cup -- Oral - Thin Straw -- Oral - Puree -- Oral - Mech Soft -- Oral - Regular -- Oral - Multi-Consistency -- Oral - Pill -- Oral Phase - Comment --  CHL IP PHARYNGEAL PHASE 09/27/2018 Pharyngeal Phase Impaired Pharyngeal- Pudding Teaspoon -- Pharyngeal -- Pharyngeal- Pudding Cup -- Pharyngeal -- Pharyngeal- Honey Teaspoon -- Pharyngeal -- Pharyngeal- Honey Cup -- Pharyngeal -- Pharyngeal- Nectar Teaspoon -- Pharyngeal -- Pharyngeal- Nectar Cup -- Pharyngeal -- Pharyngeal- Nectar Straw -- Pharyngeal -- Pharyngeal- Thin Teaspoon Delayed swallow initiation-vallecula;Penetration/Aspiration during swallow Pharyngeal Material does not enter airway;Material enters airway, remains ABOVE vocal cords then ejected out Pharyngeal- Thin Cup Delayed swallow initiation-vallecula;WFL Pharyngeal -- Pharyngeal- Thin Straw WFL Pharyngeal -- Pharyngeal- Puree Delayed swallow initiation-vallecula;Pharyngeal residue - valleculae Pharyngeal -- Pharyngeal- Mechanical Soft -- Pharyngeal -- Pharyngeal- Regular Delayed swallow  initiation-vallecula;Pharyngeal residue - cp segment Pharyngeal -- Pharyngeal- Multi-consistency -- Pharyngeal -- Pharyngeal- Pill Delayed swallow initiation-vallecula Pharyngeal -- Pharyngeal Comment --  CHL IP CERVICAL ESOPHAGEAL PHASE 09/27/2018 Cervical Esophageal Phase Impaired Pudding Teaspoon -- Pudding Cup -- Honey Teaspoon -- Honey Cup -- Nectar Teaspoon -- Nectar Cup -- Nectar Straw -- Thin Teaspoon -- Thin Cup -- Thin Straw -- Puree -- Mechanical Soft -- Regular -- Multi-consistency -- Pill -- Cervical Esophageal Comment -- Thank you, Genene Churn, [place] PORTER,DABNEY 09/27/2018, 3:51 PM               Micro Results    Recent Results (from the past 240 hour(s))  Culture, blood (routine x 2)     Status: None (Preliminary result)   Collection Time: 09/25/18  3:51 PM  Result Value Ref Range Status   Specimen Description BLOOD RIGHT ANTECUBITAL DRAWN BY RN  Final   Special Requests   Final    BOTTLES DRAWN AEROBIC AND ANAEROBIC Blood Culture adequate volume   Culture   Final    NO GROWTH 4 DAYS Performed at [facility], [address] [place], [place] [address]    Report Status PENDING  Incomplete  Culture, blood (routine x 2)     Status: None (Preliminary result)   Collection Time: 09/25/18  3:52 PM  Result Value Ref Range Status   Specimen Description BLOOD RIGHT HAND DRAWN BY RN  Final   Special Requests   Final    BOTTLES DRAWN AEROBIC ONLY Blood Culture results may not be optimal due to an inadequate volume of blood received in culture bottles   Culture   Final    NO GROWTH 4 DAYS Performed at [facility], [address], [place],  [address]    Report Status PENDING  Incomplete  Urine culture     Status: Abnormal   Collection Time: 09/25/18  5:10 PM  Result Value Ref Range Status   Specimen Description   Final    URINE, CLEAN CATCH Performed at [facility], [address], [place], [place] [address]    Special Requests   Final    NONE Performed at  [facility], [address] [place], [place] [address]    Culture (A)  Final    <10,000 COLONIES/mL INSIGNIFICANT GROWTH Performed at [facility] [address], [place], [place] [address]    Report Status 09/27/2018 FINAL  Final  Respiratory Panel by PCR     Status: None   Collection Time: 09/25/18  7:00 PM  Result Value Ref Range Status   Adenovirus NOT DETECTED NOT DETECTED Final   Coronavirus 229E NOT DETECTED NOT DETECTED Final    Comment: (NOTE) The Coronavirus on the Respiratory Panel, DOES NOT test for the novel  Coronavirus (2019 nCoV)    Coronavirus HKU1 NOT DETECTED NOT DETECTED Final   Coronavirus NL63 NOT DETECTED NOT DETECTED Final   Coronavirus OC43 NOT DETECTED NOT DETECTED Final   Metapneumovirus NOT DETECTED NOT DETECTED Final   Rhinovirus / Enterovirus NOT DETECTED NOT DETECTED Final   Influenza A NOT DETECTED NOT DETECTED Final   Influenza B NOT DETECTED NOT DETECTED Final   Parainfluenza Virus 1 NOT DETECTED NOT DETECTED Final   Parainfluenza Virus 2 NOT DETECTED NOT DETECTED Final   Parainfluenza Virus 3 NOT DETECTED NOT DETECTED Final   Parainfluenza Virus 4 NOT DETECTED NOT DETECTED Final   Respiratory Syncytial Virus NOT DETECTED NOT DETECTED Final   Bordetella pertussis NOT DETECTED NOT DETECTED Final   Chlamydophila pneumoniae NOT DETECTED NOT DETECTED Final   Mycoplasma pneumoniae NOT DETECTED NOT DETECTED Final    Comment: Performed at [facility], [place] [address], [place], [place] [address]    Today   Subjective    Kenneth [name] today has no new complaints, wife at bedside, respiratory status has improved significantly, patient on room air and doing well,          Patient has been seen and examined prior to discharge   Objective   Blood pressure 118/60, pulse 70, temperature 98.3 F (36.8 C), temperature source Oral, resp. rate 18, height 5' 5" (1.651 m), weight 81.6 kg, SpO2 91 %.   Intake/Output Summary (Last 24 hours)  at 09/29/2018 1511 Last data filed at 09/29/2018 0900 Gross per 24 hour  Intake 480 ml  Output 450 ml  Net 30 ml    Exam General exam: Appears calm and comfortable  Respiratory system: Improving air movement,  no wheezing,tracheostomy present.  PTA was on humidified air at home Cardiovascular system: S1 & S2 heard, RRR. No JVD, murmurs, rubs, gallops or clicks. No pedal edema. Gastrointestinal system: Abdomen is nondistended, soft and nontender. No organomegaly or masses felt. Normal bowel sounds heard. Central nervous system: Alert and oriented. No focal neurological deficits. Extremities: Symmetric 5 x 5 power. Skin: No rashes, lesions or ulcers Psychiatry: Judgement and insight appear normal. Mood & affect appropriate.    Data Review   CBC w Diff:  Lab Results  Component Value Date   WBC 12.5 (H) 09/29/2018   HGB 12.3 (L) 09/29/2018   HCT 40.3 09/29/2018   PLT 360 09/29/2018   LYMPHOPCT 6 09/25/2018   MONOPCT 6 09/25/2018   EOSPCT 1 09/25/2018   BASOPCT 0 09/25/2018  CMP:  Lab Results  Component Value Date   NA 136 09/29/2018   K 4.2 09/29/2018   CL 101 09/29/2018   CO2 25 09/29/2018   BUN 13 09/29/2018   CREATININE 1.26 (H) 09/29/2018   PROT 7.5 09/25/2018   ALBUMIN 3.4 (L) 09/25/2018   BILITOT 0.5 09/25/2018   ALKPHOS 109 09/25/2018   AST 14 (L) 09/25/2018   ALT 15 09/25/2018  .   Total Discharge time is about 33 minutes  Roxan Hockey M.D on 09/29/2018 at 3:11 PM  Go to [email or web address] -  for contact info  [facility] - Office  [phone]

## 2018-09-30 LAB — CULTURE, BLOOD (ROUTINE X 2)
Culture: NO GROWTH
Culture: NO GROWTH
Special Requests: ADEQUATE

## 2018-10-03 DIAGNOSIS — J439 Emphysema, unspecified: Secondary | ICD-10-CM | POA: Diagnosis not present

## 2018-10-03 DIAGNOSIS — J189 Pneumonia, unspecified organism: Secondary | ICD-10-CM | POA: Diagnosis not present

## 2018-10-03 DIAGNOSIS — I700 Atherosclerosis of aorta: Secondary | ICD-10-CM | POA: Diagnosis not present

## 2018-10-08 DIAGNOSIS — Z930 Tracheostomy status: Secondary | ICD-10-CM | POA: Diagnosis not present

## 2018-10-08 DIAGNOSIS — Z8521 Personal history of malignant neoplasm of larynx: Secondary | ICD-10-CM | POA: Diagnosis not present

## 2018-10-08 DIAGNOSIS — J398 Other specified diseases of upper respiratory tract: Secondary | ICD-10-CM | POA: Diagnosis not present

## 2018-10-09 ENCOUNTER — Other Ambulatory Visit: Payer: Self-pay | Admitting: Cardiology

## 2018-10-09 ENCOUNTER — Other Ambulatory Visit (HOSPITAL_COMMUNITY): Payer: Self-pay | Admitting: Internal Medicine

## 2018-10-09 ENCOUNTER — Ambulatory Visit (HOSPITAL_COMMUNITY)
Admission: RE | Admit: 2018-10-09 | Discharge: 2018-10-09 | Disposition: A | Discharge status: Home / Self Care | Payer: Medicare HMO | Source: Ambulatory Visit | Attending: Internal Medicine | Admitting: Internal Medicine

## 2018-10-09 ENCOUNTER — Other Ambulatory Visit: Payer: Self-pay

## 2018-10-09 DIAGNOSIS — Z8521 Personal history of malignant neoplasm of larynx: Secondary | ICD-10-CM | POA: Diagnosis not present

## 2018-10-09 DIAGNOSIS — J181 Lobar pneumonia, unspecified organism: Secondary | ICD-10-CM | POA: Insufficient documentation

## 2018-10-09 DIAGNOSIS — Z930 Tracheostomy status: Secondary | ICD-10-CM | POA: Diagnosis not present

## 2018-10-09 DIAGNOSIS — J189 Pneumonia, unspecified organism: Secondary | ICD-10-CM | POA: Diagnosis not present

## 2018-10-09 DIAGNOSIS — J398 Other specified diseases of upper respiratory tract: Secondary | ICD-10-CM | POA: Diagnosis not present

## 2018-10-10 ENCOUNTER — Encounter: Payer: Self-pay | Admitting: Internal Medicine

## 2018-10-10 DIAGNOSIS — Z430 Encounter for attention to tracheostomy: Secondary | ICD-10-CM | POA: Diagnosis not present

## 2018-10-10 DIAGNOSIS — J9503 Malfunction of tracheostomy stoma: Secondary | ICD-10-CM | POA: Diagnosis not present

## 2018-10-10 DIAGNOSIS — Z8521 Personal history of malignant neoplasm of larynx: Secondary | ICD-10-CM | POA: Diagnosis not present

## 2018-10-10 DIAGNOSIS — Z87891 Personal history of nicotine dependence: Secondary | ICD-10-CM | POA: Diagnosis not present

## 2018-10-10 DIAGNOSIS — Z930 Tracheostomy status: Secondary | ICD-10-CM | POA: Diagnosis not present

## 2018-10-12 DIAGNOSIS — J189 Pneumonia, unspecified organism: Secondary | ICD-10-CM | POA: Diagnosis not present

## 2018-10-13 ENCOUNTER — Encounter (HOSPITAL_COMMUNITY): Payer: Self-pay | Admitting: Emergency Medicine

## 2018-10-13 ENCOUNTER — Inpatient Hospital Stay (HOSPITAL_COMMUNITY)
Admission: EM | Admit: 2018-10-13 | Discharge: 2018-10-18 | DRG: 871 | Disposition: A | Discharge status: Home / Self Care | Payer: Medicare HMO | Attending: Family Medicine | Admitting: Family Medicine

## 2018-10-13 ENCOUNTER — Emergency Department (HOSPITAL_COMMUNITY): Payer: Medicare HMO

## 2018-10-13 ENCOUNTER — Other Ambulatory Visit: Payer: Self-pay

## 2018-10-13 DIAGNOSIS — Z20828 Contact with and (suspected) exposure to other viral communicable diseases: Secondary | ICD-10-CM | POA: Diagnosis present

## 2018-10-13 DIAGNOSIS — R079 Chest pain, unspecified: Secondary | ICD-10-CM | POA: Diagnosis not present

## 2018-10-13 DIAGNOSIS — J189 Pneumonia, unspecified organism: Secondary | ICD-10-CM | POA: Diagnosis not present

## 2018-10-13 DIAGNOSIS — Z8349 Family history of other endocrine, nutritional and metabolic diseases: Secondary | ICD-10-CM

## 2018-10-13 DIAGNOSIS — Z880 Allergy status to penicillin: Secondary | ICD-10-CM | POA: Diagnosis not present

## 2018-10-13 DIAGNOSIS — Z930 Tracheostomy status: Secondary | ICD-10-CM

## 2018-10-13 DIAGNOSIS — Z8673 Personal history of transient ischemic attack (TIA), and cerebral infarction without residual deficits: Secondary | ICD-10-CM | POA: Diagnosis not present

## 2018-10-13 DIAGNOSIS — Z7902 Long term (current) use of antithrombotics/antiplatelets: Secondary | ICD-10-CM

## 2018-10-13 DIAGNOSIS — I10 Essential (primary) hypertension: Secondary | ICD-10-CM | POA: Diagnosis not present

## 2018-10-13 DIAGNOSIS — I739 Peripheral vascular disease, unspecified: Secondary | ICD-10-CM | POA: Diagnosis present

## 2018-10-13 DIAGNOSIS — R509 Fever, unspecified: Secondary | ICD-10-CM | POA: Diagnosis not present

## 2018-10-13 DIAGNOSIS — R0602 Shortness of breath: Secondary | ICD-10-CM | POA: Diagnosis not present

## 2018-10-13 DIAGNOSIS — R05 Cough: Secondary | ICD-10-CM | POA: Diagnosis not present

## 2018-10-13 DIAGNOSIS — Z8701 Personal history of pneumonia (recurrent): Secondary | ICD-10-CM | POA: Diagnosis not present

## 2018-10-13 DIAGNOSIS — Z881 Allergy status to other antibiotic agents status: Secondary | ICD-10-CM | POA: Diagnosis not present

## 2018-10-13 DIAGNOSIS — Z8249 Family history of ischemic heart disease and other diseases of the circulatory system: Secondary | ICD-10-CM | POA: Diagnosis not present

## 2018-10-13 DIAGNOSIS — I2510 Atherosclerotic heart disease of native coronary artery without angina pectoris: Secondary | ICD-10-CM | POA: Diagnosis not present

## 2018-10-13 DIAGNOSIS — J151 Pneumonia due to Pseudomonas: Secondary | ICD-10-CM | POA: Diagnosis not present

## 2018-10-13 DIAGNOSIS — N179 Acute kidney failure, unspecified: Secondary | ICD-10-CM | POA: Diagnosis present

## 2018-10-13 DIAGNOSIS — N183 Chronic kidney disease, stage 3 (moderate): Secondary | ICD-10-CM | POA: Diagnosis not present

## 2018-10-13 DIAGNOSIS — Z9002 Acquired absence of larynx: Secondary | ICD-10-CM | POA: Diagnosis not present

## 2018-10-13 DIAGNOSIS — R0902 Hypoxemia: Secondary | ICD-10-CM | POA: Diagnosis not present

## 2018-10-13 DIAGNOSIS — Z8521 Personal history of malignant neoplasm of larynx: Secondary | ICD-10-CM

## 2018-10-13 DIAGNOSIS — Z7982 Long term (current) use of aspirin: Secondary | ICD-10-CM | POA: Diagnosis not present

## 2018-10-13 DIAGNOSIS — Z87891 Personal history of nicotine dependence: Secondary | ICD-10-CM

## 2018-10-13 DIAGNOSIS — I959 Hypotension, unspecified: Secondary | ICD-10-CM | POA: Diagnosis not present

## 2018-10-13 DIAGNOSIS — I129 Hypertensive chronic kidney disease with stage 1 through stage 4 chronic kidney disease, or unspecified chronic kidney disease: Secondary | ICD-10-CM | POA: Diagnosis present

## 2018-10-13 DIAGNOSIS — Z888 Allergy status to other drugs, medicaments and biological substances status: Secondary | ICD-10-CM | POA: Diagnosis not present

## 2018-10-13 DIAGNOSIS — A4152 Sepsis due to Pseudomonas: Principal | ICD-10-CM | POA: Diagnosis present

## 2018-10-13 DIAGNOSIS — Z951 Presence of aortocoronary bypass graft: Secondary | ICD-10-CM | POA: Diagnosis not present

## 2018-10-13 DIAGNOSIS — R093 Abnormal sputum: Secondary | ICD-10-CM | POA: Diagnosis not present

## 2018-10-13 DIAGNOSIS — E872 Acidosis: Secondary | ICD-10-CM | POA: Diagnosis not present

## 2018-10-13 DIAGNOSIS — Y95 Nosocomial condition: Secondary | ICD-10-CM | POA: Diagnosis present

## 2018-10-13 DIAGNOSIS — R918 Other nonspecific abnormal finding of lung field: Secondary | ICD-10-CM | POA: Diagnosis not present

## 2018-10-13 DIAGNOSIS — Z885 Allergy status to narcotic agent status: Secondary | ICD-10-CM | POA: Diagnosis not present

## 2018-10-13 DIAGNOSIS — R001 Bradycardia, unspecified: Secondary | ICD-10-CM | POA: Diagnosis not present

## 2018-10-13 DIAGNOSIS — R0600 Dyspnea, unspecified: Secondary | ICD-10-CM | POA: Diagnosis not present

## 2018-10-13 LAB — BASIC METABOLIC PANEL
Anion gap: 13 (ref 5–15)
BUN: 18 mg/dL (ref 8–23)
CO2: 24 mmol/L (ref 22–32)
Calcium: 9.6 mg/dL (ref 8.9–10.3)
Chloride: 96 mmol/L — ABNORMAL LOW (ref 98–111)
Creatinine, Ser: 1.45 mg/dL — ABNORMAL HIGH (ref 0.61–1.24)
GFR calc Af Amer: 53 mL/min — ABNORMAL LOW (ref >60)
GFR calc non Af Amer: 45 mL/min — ABNORMAL LOW (ref >60)
Glucose, Bld: 151 mg/dL — ABNORMAL HIGH (ref 70–99)
Potassium: 4.8 mmol/L (ref 3.5–5.1)
Sodium: 133 mmol/L — ABNORMAL LOW (ref 135–145)

## 2018-10-13 LAB — RESPIRATORY PANEL BY PCR

## 2018-10-13 LAB — CBC WITH DIFFERENTIAL/PLATELET
Abs Immature Granulocytes: 0.16 10*3/uL — ABNORMAL HIGH (ref 0.00–0.07)
Basophils Absolute: 0.1 10*3/uL (ref 0.0–0.1)
Basophils Relative: 0 %
Eosinophils Absolute: 0.1 10*3/uL (ref 0.0–0.5)
Eosinophils Relative: 0 %
HCT: 39 % (ref 39.0–52.0)
Hemoglobin: 12.4 g/dL — ABNORMAL LOW (ref 13.0–17.0)
Immature Granulocytes: 1 %
Lymphocytes Relative: 3 %
Lymphs Abs: 0.6 10*3/uL — ABNORMAL LOW (ref 0.7–4.0)
MCH: 27.9 pg (ref 26.0–34.0)
MCHC: 31.8 g/dL (ref 30.0–36.0)
MCV: 87.8 fL (ref 80.0–100.0)
Monocytes Absolute: 1 10*3/uL (ref 0.1–1.0)
Monocytes Relative: 5 %
Neutro Abs: 19.3 10*3/uL — ABNORMAL HIGH (ref 1.7–7.7)
Neutrophils Relative %: 91 %
Platelets: 479 10*3/uL — ABNORMAL HIGH (ref 150–400)
RBC: 4.44 MIL/uL (ref 4.22–5.81)
RDW: 14.9 % (ref 11.5–15.5)
WBC: 21.1 10*3/uL — ABNORMAL HIGH (ref 4.0–10.5)
nRBC: 0 % (ref 0.0–0.2)

## 2018-10-13 LAB — HEPATIC FUNCTION PANEL
ALT: 25 U/L (ref 0–44)
AST: 24 U/L (ref 15–41)
Albumin: 3.3 g/dL — ABNORMAL LOW (ref 3.5–5.0)
Alkaline Phosphatase: 124 U/L (ref 38–126)
Bilirubin, Direct: <0.1 mg/dL (ref 0.0–0.2)
Total Bilirubin: 0.8 mg/dL (ref 0.3–1.2)
Total Protein: 8 g/dL (ref 6.5–8.1)

## 2018-10-13 LAB — URINALYSIS, ROUTINE W REFLEX MICROSCOPIC
Bilirubin Urine: NEGATIVE
Glucose, UA: NEGATIVE mg/dL
Hgb urine dipstick: NEGATIVE
Ketones, ur: NEGATIVE mg/dL
Leukocytes,Ua: NEGATIVE
Nitrite: NEGATIVE
Protein, ur: NEGATIVE mg/dL
Specific Gravity, Urine: 1.01 (ref 1.005–1.030)
pH: 8 (ref 5.0–8.0)

## 2018-10-13 LAB — INFLUENZA PANEL BY PCR (TYPE A & B)
Influenza A By PCR: NEGATIVE
Influenza B By PCR: NEGATIVE

## 2018-10-13 LAB — LACTIC ACID, PLASMA
Lactic Acid, Venous: 3 mmol/L (ref 0.5–1.9)
Lactic Acid, Venous: 2.2 mmol/L (ref 0.5–1.9)
Lactic Acid, Venous: 1.8 mmol/L (ref 0.5–1.9)

## 2018-10-13 LAB — PROTIME-INR
INR: 1.1 (ref 0.8–1.2)
Prothrombin Time: 13.6 seconds (ref 11.4–15.2)

## 2018-10-13 LAB — TROPONIN I: Troponin I: <0.03 ng/mL (ref <0.03)

## 2018-10-13 LAB — BRAIN NATRIURETIC PEPTIDE: B Natriuretic Peptide: 84 pg/mL (ref 0.0–100.0)

## 2018-10-13 LAB — PROCALCITONIN: Procalcitonin: 0.1 ng/mL

## 2018-10-13 MED ORDER — SODIUM CHLORIDE 0.9 % IV BOLUS
500.0000 mL | Freq: Once | INTRAVENOUS | Status: AC
  Administered 2018-10-13: 500 mL via INTRAVENOUS

## 2018-10-13 MED ORDER — SODIUM CHLORIDE 0.9 % IV SOLN
2.0000 g | Freq: Once | INTRAVENOUS | Status: AC
  Administered 2018-10-13: 2 g via INTRAVENOUS
  Filled 2018-10-13: qty 2

## 2018-10-13 MED ORDER — LINEZOLID 600 MG/300ML IV SOLN
600.0000 mg | Freq: Two times a day (BID) | INTRAVENOUS | Status: DC
  Administered 2018-10-13 – 2018-10-16 (×6): 600 mg via INTRAVENOUS
  Filled 2018-10-13 (×12): qty 300

## 2018-10-13 MED ORDER — POLYETHYLENE GLYCOL 3350 17 G PO PACK
17.0000 g | PACK | Freq: Every day | ORAL | Status: DC | PRN
  Filled 2018-10-13: qty 1

## 2018-10-13 MED ORDER — SODIUM CHLORIDE 0.9 % IV SOLN
1.0000 g | Freq: Two times a day (BID) | INTRAVENOUS | Status: AC
  Administered 2018-10-13 – 2018-10-17 (×9): 1 g via INTRAVENOUS
  Filled 2018-10-13 (×9): qty 1

## 2018-10-13 MED ORDER — SODIUM CHLORIDE 0.9 % IV BOLUS
1000.0000 mL | Freq: Once | INTRAVENOUS | Status: AC
  Administered 2018-10-13: 1000 mL via INTRAVENOUS

## 2018-10-13 MED ORDER — ACETAMINOPHEN 325 MG PO TABS: 650.0000 mg | ORAL_TABLET | Freq: Four times a day (QID) | ORAL | Status: DC | PRN

## 2018-10-13 MED ORDER — ACETAMINOPHEN 325 MG PO TABS
650.0000 mg | ORAL_TABLET | Freq: Once | ORAL | Status: AC
  Administered 2018-10-13: 650 mg via ORAL
  Filled 2018-10-13: qty 2

## 2018-10-13 MED ORDER — IOHEXOL 350 MG/ML SOLN
75.0000 mL | Freq: Once | INTRAVENOUS | Status: AC | PRN
  Administered 2018-10-13: 75 mL via INTRAVENOUS

## 2018-10-13 MED ORDER — ONDANSETRON HCL 4 MG PO TABS
4.0000 mg | ORAL_TABLET | Freq: Four times a day (QID) | ORAL | Status: DC | PRN
  Administered 2018-10-17: 4 mg via ORAL
  Filled 2018-10-13: qty 1

## 2018-10-13 MED ORDER — GUAIFENESIN 100 MG/5ML PO SOLN
10.0000 mL | ORAL | Status: DC | PRN
  Administered 2018-10-17: 200 mg via ORAL
  Filled 2018-10-13: qty 5

## 2018-10-13 MED ORDER — IPRATROPIUM-ALBUTEROL 0.5-2.5 (3) MG/3ML IN SOLN
3.0000 mL | Freq: Once | RESPIRATORY_TRACT | Status: AC
  Administered 2018-10-13: 3 mL via RESPIRATORY_TRACT
  Filled 2018-10-13: qty 3

## 2018-10-13 MED ORDER — GUAIFENESIN-DM 100-10 MG/5ML PO SYRP: 10.0000 mL | ORAL_SOLUTION | Freq: Three times a day (TID) | ORAL | Status: DC

## 2018-10-13 MED ORDER — SODIUM CHLORIDE 0.9 % IV SOLN
1000.0000 mL | INTRAVENOUS | Status: DC
  Administered 2018-10-13: 1000 mL via INTRAVENOUS

## 2018-10-13 MED ORDER — ACETAMINOPHEN 650 MG RE SUPP: 650.0000 mg | Freq: Four times a day (QID) | RECTAL | Status: DC | PRN

## 2018-10-13 MED ORDER — METRONIDAZOLE IN NACL 5-0.79 MG/ML-% IV SOLN
500.0000 mg | Freq: Once | INTRAVENOUS | Status: DC
  Filled 2018-10-13: qty 100

## 2018-10-13 MED ORDER — ALBUTEROL SULFATE (2.5 MG/3ML) 0.083% IN NEBU
2.5000 mg | INHALATION_SOLUTION | Freq: Once | RESPIRATORY_TRACT | Status: AC
  Administered 2018-10-13: 2.5 mg via RESPIRATORY_TRACT
  Filled 2018-10-13: qty 3

## 2018-10-13 MED ORDER — SODIUM CHLORIDE 0.9 % IV SOLN
1000.0000 mL | INTRAVENOUS | Status: AC
  Administered 2018-10-13: 1000 mL via INTRAVENOUS

## 2018-10-13 MED ORDER — ONDANSETRON HCL 4 MG/2ML IJ SOLN: 4.0000 mg | Freq: Four times a day (QID) | INTRAMUSCULAR | Status: DC | PRN

## 2018-10-13 MED ORDER — SODIUM CHLORIDE 0.9 % IV SOLN: 2.0000 g | Freq: Three times a day (TID) | INTRAVENOUS | Status: DC

## 2018-10-13 NOTE — ED Provider Notes (Signed)
[facility] EMERGENCY DEPARTMENT Provider Note   CSN: [id number] Arrival date & time: 10/13/18  6759    History   Chief Complaint Chief Complaint  Patient presents with   Shortness of Breath   Chest Pain    HPI Kenneth [name] is a 78 y.o. male.      Shortness of Breath  Associated symptoms: chest pain   Chest Pain  Associated symptoms: shortness of breath     Pt was seen at 1015. Per EMS and pt report: Pt c/o gradual onset and worsening of persistent cough, SOB, and CP for the past 2 weeks. EMS states pt's O2 Sats were mid-80's on their arrival to scene. Pt does not have O2 at home. Denies palpitations, no abd pain, no N/V/D, no back pain, no fevers, no rash.     Past Medical History:  Diagnosis Date   Anxiety    Arthritis    Back (05/04/2018)   ASCVD (arteriosclerotic cardiovascular disease)    Chronic lower back pain    "have it at night" (05/04/2018)   Coronary artery disease    a. 2003: s/p CABG x3V  b. 2007: cath with patent bypass grafts.  c. 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   Malodorous urine    "in the last 5wks; since I had trach put in" (05/04/2018)   Peripheral vascular disease ([place])    Stroke [facility])    "he's had several little strokes; many that he wasn't aware of" (05/04/2018)   Tobacco abuse    Tracheal stenosis     Patient Active Problem List   Diagnosis Date Noted   Pneumonia 09/25/2018   Acute on chronic respiratory failure with hypoxia ([place]) 09/06/2018   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   Pneumonia due to infectious organism    SOB (shortness of breath) 09/05/2018   Carotid stenosis, symptomatic, with  infarction ([place]) 05/04/2018   Ischemic stroke ([place]) 11/15/2017   TIA (transient ischemic attack) 11/14/2017   Constipation 09/15/2017   Chest pain 10/06/2016   Essential hypertension 10/06/2016   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   Dyspnea 12/05/2015   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   Aftercare following surgery of the circulatory system, NEC 08/03/2013   Occlusion and stenosis of carotid artery without mention of cerebral infarction 07/21/2012   Preop cardiovascular exam 05/08/2012   Carotid stenosis, bilateral 05/05/2012   CAD in native artery 08/13/2011   Malignant neoplasm of larynx ([place]) 12/14/2009   HLD (hyperlipidemia) 12/14/2009   CEREBROVASCULAR ACCIDENT 12/14/2009   GASTROESOPHAGEAL REFLUX DISEASE 12/14/2009   NEPHROLITHIASIS 12/14/2009    Past Surgical History:  Procedure Laterality Date   CAROTID STENT Right 06-13-12; 05/04/2018   CAROTID STENT INSERTION N/A 06/13/2012   Procedure: CAROTID STENT INSERTION;  Surgeon: Serafina Mitchell, MD;  Location: [facility];  Service: Cardiovascular;  Laterality: N/A;   CATARACT EXTRACTION, BILATERAL Bilateral    COLONOSCOPY  11/10/2011   Dr. [name]: hemorrhoids, tubular adenoma   CORNEAL TRANSPLANT Bilateral    CORONARY ARTERY BYPASS GRAFT  ~ 2003   "CABG X3"   EYE SURGERY     INSERTION OF RETROGRADE CAROTID STENT Right 05/04/2018   Procedure: INSERTION OF RIGHT  CAROTID  STENT using an ABBOT- XACT carotid stent system;  Surgeon: Serafina Mitchell, MD;  Location: [facility];  Service: Vascular;  Laterality: Right;   LAPAROSCOPIC CHOLECYSTECTOMY  2007   LEFT HEART CATH AND CORS/GRAFTS ANGIOGRAPHY N/A 10/07/2016   Procedure: Left Heart Cath and Cors/Grafts Angiography;  Surgeon: Troy Sine, MD;  Location: [facility];  Service: Cardiovascular;  Laterality: N/A;   PARTIAL LARYNGECTOMY  1998   TRACHEOSTOMY  03/13/2018   "@ [facility]"        Home Medications    Prior to Admission medications   Medication  Sig Start Date End Date Taking? Authorizing Provider  acetaminophen (TYLENOL) 325 MG tablet Take 2 tablets (650 mg total) by mouth every 6 (six) hours as needed for mild pain (or Fever >/= 101). 09/29/18   Roxan Hockey, MD  albuterol (PROVENTIL) (2.5 MG/3ML) 0.083% nebulizer solution Take 3 mLs (2.5 mg total) by nebulization 3 (three) times daily. X 5 days and then q 6 hrs prn after that 09/29/18   Roxan Hockey, MD  amLODipine (NORVASC) 10 MG tablet Take 1 tablet by mouth once daily 10/10/18   Arnoldo Lenis, MD  aspirin 325 MG tablet Take 1 tablet (325 mg total) by mouth daily. 11/16/17   Johnson, Clanford L, MD  clopidogrel (PLAVIX) 75 MG tablet Take 1 tablet (75 mg total) by mouth daily. Patient taking differently: Take 75 mg by mouth every morning.  12/26/17   Arnoldo Lenis, MD  ezetimibe (ZETIA) 10 MG tablet Take 1 tablet by mouth once daily 10/10/18   Arnoldo Lenis, MD  metoprolol tartrate (LOPRESSOR) 25 MG tablet Take 25 mg by mouth every morning.  03/16/17   [provider]  nitroGLYCERIN (NITROSTAT) 0.4 MG SL tablet Place 1 tablet (0.4 mg total) under the tongue every 5 (five) minutes as needed for chest pain. 01/31/17 05/25/21  Lendon Colonel, NP  omeprazole (PRILOSEC) 20 MG capsule Take 20 mg by mouth every morning.     [provider]  PARoxetine (PAXIL) 20 MG tablet Take 20 mg by mouth at bedtime.     [provider]  spironolactone (ALDACTONE) 25 MG tablet Take 25 mg by mouth every morning.     [provider]    Family History Family History  Problem Relation Age of Onset   Dementia Mother    Heart disease Father    Cancer Brother    Heart disease Brother    Hyperlipidemia Brother    Cancer Brother    Colon cancer Neg Hx     Social History Social History   Tobacco Use   Smoking status: Former Smoker    Packs/day: 1.00    Years: 15.00    Pack years: 15.00    Types: Cigarettes    Start date: 04/22/1953    Last  attempt to quit: 07/26/1970    Years since quitting: 48.2   Smokeless tobacco: Former Systems developer    Types: Chew    Quit date: 05/05/2002  Substance Use Topics   Alcohol use: Never    Alcohol/week: 0.0 standard drinks    Frequency: Never   Drug use: Never     Allergies   Nifedipine; Lorazepam; Other; Penicillins; Sulfacetamide; Sulfonamide derivatives; Erythromycin; Statins; and Vancomycin   Review of Systems Review of Systems  Respiratory: Positive for shortness of breath.   Cardiovascular: Positive for chest pain.  ROS: Statement: All systems negative except as marked or noted in the HPI; Constitutional: Negative for fever  and chills. ; ; Eyes: Negative for eye pain, redness and discharge. ; ; ENMT: Negative for ear pain, hoarseness, nasal congestion, sinus pressure and sore throat. ; ; Cardiovascular: +CP, SOB. Negative for palpitations, diaphoresis, and peripheral edema. ; ; Respiratory: +cough. Negative for wheezing and stridor. ; ; Gastrointestinal: Negative for nausea, vomiting, diarrhea, abdominal pain, blood in stool, hematemesis, jaundice and rectal bleeding. . ; ; Genitourinary: Negative for dysuria, flank pain and hematuria. ; ; Musculoskeletal: Negative for back pain and neck pain. Negative for swelling and trauma.; ; Skin: Negative for pruritus, rash, abrasions, blisters, bruising and skin lesion.; ; Neuro: Negative for headache, lightheadedness and neck stiffness. Negative for weakness, altered level of consciousness, altered mental status, extremity weakness, paresthesias, involuntary movement, seizure and syncope.        Physical Exam Updated Vital Signs BP (!) 143/72    Pulse 78    Temp (!) 100.4 F (38 C) (Oral)    Resp (!) 29    SpO2 96%     Patient Vitals for the past 24 hrs:  BP Temp Temp src Pulse Resp SpO2 Weight  10/13/18 1515 124/67 -- -- 66 -- 93 % --  10/13/18 1330 113/76 -- -- 66 (!) 24 98 % --  10/13/18 1315 (!) 95/58 -- -- 66 (!) 26 100 % --  10/13/18  1300 110/60 -- -- 69 (!) 27 99 % --  10/13/18 1254 -- 99.5 F (37.5 C) Oral -- -- -- --  10/13/18 1248 -- -- -- -- -- -- 78.9 kg  10/13/18 1245 121/63 -- -- 69 (!) 26 100 % --  10/13/18 1230 115/61 -- -- 69 (!) 28 100 % --  10/13/18 1215 128/78 -- -- 70 20 100 % --  10/13/18 1200 132/63 -- -- 78 -- 100 % --  10/13/18 1120 -- (!) 102.1 F (38.9 C) Oral -- -- -- --  10/13/18 1116 128/70 -- -- 82 (!) 26 99 % --  10/13/18 1115 136/71 -- -- 87 (!) 28 98 % --  10/13/18 1036 -- -- -- 78 (!) 29 96 % --  10/13/18 1029 -- (!) 100.4 F (38 C) Oral -- -- -- --  10/13/18 1011 (!) 143/72 (!) 100.4 F (38 C) Oral 74 (!) 23 100 % --  10/13/18 1010 -- -- -- -- -- 93 % --    11:17:25 Orthostatic Vital Signs RH  Orthostatic Lying   BP- Lying: 136/71  Pulse- Lying: 86      Orthostatic Sitting  BP- Sitting: 128/70  Pulse- Sitting: 83     Physical Exam 1020: Physical examination:  Nursing notes reviewed; Vital signs and O2 SAT reviewed;  Constitutional: Well developed, Well nourished, Well hydrated, In no acute distress; Head:  Normocephalic, atraumatic; Eyes: EOMI, PERRL, No scleral icterus; ENMT: Mouth and pharynx normal, Mucous membranes moist; Neck: Supple, Full range of motion, +trach in place, no drainage.; Cardiovascular: Regular rate and rhythm, No gallop; Respiratory: Breath sounds coarse & equal bilaterally, No wheezes. +coarse cough. Speaking full sentences with ease, Normal respiratory effort/excursion; Chest: Nontender, Movement normal; Abdomen: Soft, Nontender, Nondistended, Normal bowel sounds; Genitourinary: No CVA tenderness; Extremities: Peripheral pulses normal, No tenderness, No edema, No calf edema or asymmetry.; Neuro: AA&Ox3, Major CN grossly intact.  Speech clear. No gross focal motor deficits in extremities.; Skin: Color normal, Warm, Dry.    ED Treatments / Results  Labs (all labs ordered are listed, but only abnormal results are displayed)   EKG EKG  Interpretation  Date/Time:  Friday October 13 2018 10:13:29 EDT Ventricular Rate:  76 PR Interval:    QRS Duration: 125 QT Interval:  366 QTC Calculation: 412 R Axis:   45 Text Interpretation:  Sinus rhythm Right bundle branch block Baseline wander When compared with ECG of 09/25/2018 No significant change was found Confirmed by Francine Graven [phone]) on 10/13/2018 10:46:15 AM   Radiology   Procedures Procedures (including critical care time)  Medications Ordered in ED Medications  ipratropium-albuterol (DUONEB) 0.5-2.5 (3) MG/3ML nebulizer solution 3 mL (3 mLs Nebulization Given 10/13/18 1038)  albuterol (PROVENTIL) (2.5 MG/3ML) 0.083% nebulizer solution 2.5 mg (2.5 mg Nebulization Given 10/13/18 1038)     Initial Impression / Assessment and Plan / ED Course  I have reviewed the triage vital signs and the nursing notes.  Pertinent labs & imaging results that were available during my care of the patient were reviewed by me and considered in my medical decision making (see chart for details).     MDM Reviewed: previous chart, nursing note and vitals Reviewed previous: labs, ECG and x-ray Interpretation: labs, ECG and CT scan Total time providing critical care: 30-74 minutes. This excludes time spent performing separately reportable procedures and services. Consults: admitting MD   CRITICAL CARE Performed by: Francine Graven Total critical care time: 45 minutes Critical care time was exclusive of separately billable procedures and treating other patients. Critical care was necessary to treat or prevent imminent or life-threatening deterioration. Critical care was time spent personally by me on the following activities: development of treatment plan with patient and/or surrogate as well as nursing, discussions with consultants, evaluation of patient's response to treatment, examination of patient, obtaining history from patient or surrogate, ordering and performing treatments  and interventions, ordering and review of laboratory studies, ordering and review of radiographic studies, pulse oximetry and re-evaluation of patient's condition.   Results for orders placed or performed during the hospital encounter of 10/13/18  Culture, blood (routine x 2)  Result Value Ref Range   Specimen Description      RIGHT ANTECUBITAL BOTTLES DRAWN AEROBIC AND ANAEROBIC   Special Requests      Blood Culture adequate volume Performed at [facility], [address], [place], [place] [address]    Culture PENDING    Report Status PENDING   Culture, blood (routine x 2)  Result Value Ref Range   Specimen Description      BLOOD RIGHT WRIST BOTTLES DRAWN AEROBIC AND ANAEROBIC   Special Requests      Blood Culture adequate volume Performed at [facility], [address], [place], [place] [address]    Culture PENDING    Report Status PENDING   Basic metabolic panel  Result Value Ref Range   Sodium 133 (L) 135 - 145 mmol/L   Potassium 4.8 3.5 - 5.1 mmol/L   Chloride 96 (L) 98 - 111 mmol/L   CO2 24 22 - 32 mmol/L   Glucose, Bld 151 (H) 70 - 99 mg/dL   BUN 18 8 - 23 mg/dL   Creatinine, Ser 1.45 (H) 0.61 - 1.24 mg/dL   Calcium 9.6 8.9 - 10.3 mg/dL   GFR calc non Af Amer 45 (L) >60 mL/min   GFR calc Af Amer 53 (L) >60 mL/min   Anion gap 13 5 - 15  Brain natriuretic peptide  Result Value Ref Range   B Natriuretic Peptide 84.0 0.0 - 100.0 pg/mL  Troponin I - Once  Result Value Ref Range   Troponin I <  0.03 <0.03 ng/mL  Lactic acid, plasma  Result Value Ref Range   Lactic Acid, Venous 3.0 (HH) 0.5 - 1.9 mmol/L  CBC with Differential  Result Value Ref Range   WBC 21.1 (H) 4.0 - 10.5 K/uL   RBC 4.44 4.22 - 5.81 MIL/uL   Hemoglobin 12.4 (L) 13.0 - 17.0 g/dL   HCT 39.0 39.0 - 52.0 %   MCV 87.8 80.0 - 100.0 fL   MCH 27.9 26.0 - 34.0 pg   MCHC 31.8 30.0 - 36.0 g/dL   RDW 14.9 11.5 - 15.5 %   Platelets 479 (H) 150 - 400 K/uL   nRBC 0.0 0.0 - 0.2 %   Neutrophils Relative % 91  %   Neutro Abs 19.3 (H) 1.7 - 7.7 K/uL   Lymphocytes Relative 3 %   Lymphs Abs 0.6 (L) 0.7 - 4.0 K/uL   Monocytes Relative 5 %   Monocytes Absolute 1.0 0.1 - 1.0 K/uL   Eosinophils Relative 0 %   Eosinophils Absolute 0.1 0.0 - 0.5 K/uL   Basophils Relative 0 %   Basophils Absolute 0.1 0.0 - 0.1 K/uL   Immature Granulocytes 1 %   Abs Immature Granulocytes 0.16 (H) 0.00 - 0.07 K/uL  Protime-INR  Result Value Ref Range   Prothrombin Time 13.6 11.4 - 15.2 seconds   INR 1.1 0.8 - 1.2  Influenza panel by PCR (type A & B)  Result Value Ref Range   Influenza A By PCR NEGATIVE NEGATIVE   Influenza B By PCR NEGATIVE NEGATIVE  Hepatic function panel  Result Value Ref Range   Total Protein 8.0 6.5 - 8.1 g/dL   Albumin 3.3 (L) 3.5 - 5.0 g/dL   AST 24 15 - 41 U/L   ALT 25 0 - 44 U/L   Alkaline Phosphatase 124 38 - 126 U/L   Total Bilirubin 0.8 0.3 - 1.2 mg/dL   Bilirubin, Direct <0.1 0.0 - 0.2 mg/dL   Indirect Bilirubin NOT CALCULATED 0.3 - 0.9 mg/dL     Dg Chest Port 1 View Result Date: 10/13/2018 CLINICAL DATA:  Chest pain and shortness of breath EXAM: PORTABLE CHEST 1 VIEW COMPARISON:  October 09, 2018 FINDINGS: Tracheostomy catheter tip is 5.6 cm above the carina. There is scarring in each mid lung region and right base. There is no appreciable edema or consolidation. Heart size and pulmonary vascularity are normal. Patient is status post internal mammary bypass grafting. No adenopathy. No bone lesions. A stent is noted in the right carotid region. IMPRESSION: Tracheostomy as described. No pneumothorax. Areas of scarring bilaterally, more on the right than on the left. No edema or consolidation. Stable cardiac silhouette. Right carotid stent noted. Electronically Signed   By: Lowella Grip III M.D.   On: 10/13/2018 10:40         Ct Angio Chest Pe W/cm &/or Wo Cm Result Date: 10/13/2018 CLINICAL DATA:  78 year old male with shortness of breath. EXAM: CT ANGIOGRAPHY CHEST WITH CONTRAST  TECHNIQUE: Multidetector CT imaging of the chest was performed using the standard protocol during bolus administration of intravenous contrast. Multiplanar CT image reconstructions and MIPs were obtained to evaluate the vascular anatomy. CONTRAST:  61mL OMNIPAQUE IOHEXOL 350 MG/ML SOLN COMPARISON:  CT chest 09/25/2018 FINDINGS: Cardiovascular: Good contrast bolus timing in the pulmonary arterial tree. No focal filling defect identified in the pulmonary arteries to suggest acute pulmonary embolism. Prior CABG. Stable mild cardiomegaly. No pericardial effusion. Calcified aortic atherosclerosis. Patent partially visible right CCA vascular stent. Mediastinum/Nodes:  Stable small mediastinal lymph nodes and sequelae of CABG. Lungs/Pleura: Stable tracheostomy with no adverse features. Major airways are patent although with mildly increased atelectatic changes. Continued widespread Patchy and confluent bilateral pulmonary ground-glass opacity. Progression in the right upper lobe since 09/25/2018 (series 6, image 28 today). Superimposed increased dependent atelectasis. Superimposed mosaic attenuation which is more apparent in the right lung and suggests gas trapping. No areas of improvement are identified. Trace right pleural fluid or pleural thickening is stable. Upper Abdomen: Surgically absent gallbladder. Stable and negative visible liver, spleen, pancreas, adrenal glands, kidneys, and bowel. Musculoskeletal: No acute osseous abnormality identified. Flowing endplate osteophytes in the thoracic spine with some ankylosis. Prior sternotomy. Other findings: There is a degree of gynecomastia. Review of the MIP images confirms the above findings. IMPRESSION: 1. No evidence of acute pulmonary embolus. 2. No improvement in abnormal bilateral lung opacity since the CT on 09/25/2018, and right upper lobe involvement appears progressed. Underlying gas trapping and atelectasis. Favor acute viral/atypical respiratory infection  superimposed on chronic lung disease. Noninfectious progression of chronic lung disease felt less likely. 3. 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 4. Aortic atherosclerosis status post CABG. Electronically Signed   By: Genevie Ann M.D.   On: 10/13/2018 15:13           1530:  On arrival: pt with very moist cough, coarse lung sounds. RT suctioned pt free of secretions and short neb given with improvement of O2 Sats. +fever. Code Sepsis called: BC, UC obtained, IV abx started (given pt's allergies, abx ordered as per last admission). No hypotension; judicious IVF given.  Pt unable to stand for orthostatic VS due to SOB and weakness.  CT scan without PE but with worsening pneumonia.  T/C returned from [facility], case discussed, including:  HPI, pertinent PM/SHx, VS/PE, dx testing, ED course and treatment:  Agreeable to admit.        Final Clinical Impressions(s) / ED Diagnoses   Final diagnoses:  None    ED Discharge Orders    None       Francine Graven, DO 10/15/18 1321

## 2018-10-13 NOTE — ED Notes (Signed)
CRITICAL VALUE ALERT  Critical Value:  Lactic acid  Date & Time Notied:  10/13/18 1110  Provider Notified: Dr. Thurnell Garbe   Orders Received/Actions taken: n/a

## 2018-10-13 NOTE — ED Triage Notes (Signed)
Pt c/o of central chest pain with sob. Trach in place.

## 2018-10-13 NOTE — Progress Notes (Signed)
Pharmacy Antibiotic Note  Kenneth [name] is a 78 y.o. male admitted on 10/13/2018 with infection- unknown source. Patient had previous reaction Vancomycin on last admission resulting in itching/redness. Pharmacy has been consulted for Zyvox and Aztreonam dosing.  Plan: Aztreonam 2000 mg IV every 8 hours. Zyvox 600 mg IV every 12 hours. Monitor labs, c/s, and patient improvement.    Temp (24hrs), Avg:101 F (38.3 C), Min:100.4 F (38 C), Max:102.1 F (38.9 C)  Recent Labs  Lab 10/13/18 1037  WBC 21.1*  CREATININE 1.45*  LATICACIDVEN 3.0*    CrCl cannot be calculated (Unknown ideal weight.).    Allergies  Allergen Reactions  . Nifedipine Other (See Comments)  . Lorazepam Other (See Comments)    REACTION: Alters mental status. "Turns into maniac" REACTION: Alters mental status. "Turns into maniac" REACTION: Alters mental status. "Turns into maniac"  . Other     REACTION: Unknown to patient. States that MD states allergy per medical records  . Penicillins Hives    Has patient had a PCN reaction causing immediate rash, facial/tongue/throat swelling, SOB or lightheadedness with hypotension: Yes Has patient had a PCN reaction causing severe rash involving mucus membranes or skin necrosis: No Has patient had a PCN reaction that required hospitalization No Has patient had a PCN reaction occurring within the last 10 years: No If all of the above answers are "NO", then may proceed with Cephalosporin use.  Has patient had a PCN reaction causing immediate rash, facial/tongue/throat swelling, SOB or lightheadedness with hypotension: Yes Has patient had a PCN reaction causing severe rash involving mucus membranes or skin necrosis: No Has patient had a PCN reaction that required hospitalization No Has patient had a PCN reaction occurring within the last 10 years: No If all of the above answers are "NO", then may proceed with Cephalosporin use.  . Sulfacetamide     REACTION: Unknown to  patient. States that MD states allergy per medical records  . Sulfonamide Derivatives Other (See Comments)    REACTION: Unknown to patient. States that MD states allergy per medical records  . Erythromycin Rash  . Statins Itching and Rash  . Vancomycin Itching    Antimicrobials this admission: Aztreonam 3/20 >>  Zyvox 3/20 >>   Dose adjustments this admission: N/A  Microbiology results: 3/20 BCx: pending 3/20 UCx: pending    Thank you for allowing pharmacy to be a part of this patient's care.  Ramond Craver 10/13/2018 11:52 AM

## 2018-10-13 NOTE — H&P (Signed)
History and Physical    Kenneth [name]:[id number] DOB: 11-22-1940 DOA: 10/13/2018  PCP: [name], MD   Patient coming from: Home  I have personally briefly reviewed patient's old medical records in [place]  Chief Complaint: SOB, fever, generalized weakness  HPI: Kenneth [name] is a 78 y.o. male with medical history significant for tracheostomy for laryngeal malignancy,HTN,carotid artery stenosis,CVA who presented to th ED with increasing difficulty breathing since discharge after recent hospitalization, with fever.   2 recent hospitalizations for similar symptoms 2/11-2/13/20 -diagnosed with community-acquired pneumonia.  Treated with Levaquin. 3/2-09/29/18-again with difficulty breathing, cough, fevers, treated as HCAP, initially started on IV meropenem and linezolid (Vanc, penicllin allergy), discharged home on Keflex and doxycycline. Patient initially felt better but symptoms gradually recurred and worsened, with low-grade fevers to 99 at home.  He has a chronic productive cough, since he had his tracheostomy, cough has not changed.  Patient has been home since onset of symptoms, wife has been with him and she has not been ill, neither has she had any flulike symptoms.  Patient denies flulike symptoms-no body aches, no sore throat, no nasal congestion, appetite has been good, no loose stools.  No recent travels, no contact with ill persons.   ED Course: Temp 102.1, tachypneic to 29, on room air sats greater than 93%, blood pressure 113- 130s.  WBC 21, lactic acidosis 3 >>2.2, after 1 L bolus.  Flu panel negative. UA clean.  CTA chest-no PE.  No improvement in abnormal bilateral lung opacities since the CT on 09/25/2018, and right upper lobe involvement appears progressed.  Underlying gas trapping and atelectasis.  Fever acute viral/atypical respiratory infection superimposed on chronic lung disease.  Noninfectious progression of chronic lung disease felt less likely. Patient  was started on IV aztreonam and linezolid, hospitalist to admit for pneumonia.  Review of Systems: As per HPI all other systems reviewed and negative.  Past Medical History:  Diagnosis Date   Anxiety    Arthritis    Back (05/04/2018)   ASCVD (arteriosclerotic cardiovascular disease)    Chronic lower back pain    "have it at night" (05/04/2018)   Coronary artery disease    a. 2003: s/p CABG x3V  b. 2007: cath with patent bypass grafts.  c. 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   Malodorous urine    "in the last 5wks; since I had trach put in" (05/04/2018)   Peripheral vascular disease ([place])    Stroke [facility])    "he's had several little strokes; many that he wasn't aware of" (05/04/2018)   Tobacco abuse    Tracheal stenosis     Past Surgical History:  Procedure Laterality Date   CAROTID STENT Right 06-13-12; 05/04/2018   CAROTID STENT INSERTION N/A 06/13/2012   Procedure: CAROTID STENT INSERTION;  Surgeon: Serafina Mitchell, MD;  Location: [facility];  Service: Cardiovascular;  Laterality: N/A;   CATARACT EXTRACTION, BILATERAL Bilateral    COLONOSCOPY  11/10/2011   Dr. [name]: hemorrhoids, tubular adenoma   CORNEAL TRANSPLANT Bilateral    CORONARY ARTERY BYPASS GRAFT  ~ 2003   "CABG X3"   EYE SURGERY  INSERTION OF RETROGRADE CAROTID STENT Right 05/04/2018   Procedure: INSERTION OF RIGHT  CAROTID STENT using an ABBOT- XACT carotid stent system;  Surgeon: Serafina Mitchell, MD;  Location: [facility];  Service: Vascular;  Laterality: Right;   LAPAROSCOPIC CHOLECYSTECTOMY  2007   LEFT HEART CATH AND CORS/GRAFTS ANGIOGRAPHY N/A 10/07/2016   Procedure: Left Heart Cath and  Cors/Grafts Angiography;  Surgeon: Troy Sine, MD;  Location: [facility];  Service: Cardiovascular;  Laterality: N/A;   PARTIAL LARYNGECTOMY  1998   TRACHEOSTOMY  03/13/2018   "@ [place]"     reports that he quit smoking about 48 years ago. His smoking use included cigarettes. He started smoking about 65 years ago. He has a 15.00 pack-year smoking history. He quit smokeless tobacco use about 16 years ago.  His smokeless tobacco use included chew. He reports that he does not drink alcohol or use drugs.  Allergies  Allergen Reactions   Nifedipine Other (See Comments)   Lorazepam Other (See Comments)    REACTION: Alters mental status. "Turns into maniac" REACTION: Alters mental status. "Turns into maniac" REACTION: Alters mental status. "Turns into maniac"   Other     REACTION: Unknown to patient. States that MD states allergy per medical records   Penicillins Hives    Has patient had a PCN reaction causing immediate rash, facial/tongue/throat swelling, SOB or lightheadedness with hypotension: Yes Has patient had a PCN reaction causing severe rash involving mucus membranes or skin necrosis: No Has patient had a PCN reaction that required hospitalization No Has patient had a PCN reaction occurring within the last 10 years: No If all of the above answers are "NO", then may proceed with Cephalosporin use.  Has patient had a PCN reaction causing immediate rash, facial/tongue/throat swelling, SOB or lightheadedness with hypotension: Yes Has patient had a PCN reaction causing severe rash involving mucus membranes or skin necrosis: No Has patient had a PCN reaction that required hospitalization No Has patient had a PCN reaction occurring within the last 10 years: No If all of the above answers are "NO", then may proceed with Cephalosporin use.   Sulfacetamide     REACTION: Unknown to patient. States that MD states allergy per medical records   Sulfonamide Derivatives Other  (See Comments)    REACTION: Unknown to patient. States that MD states allergy per medical records   Erythromycin Rash   Statins Itching and Rash   Vancomycin Itching    Family History  Problem Relation Age of Onset   Dementia Mother    Heart disease Father    Cancer Brother    Heart disease Brother    Hyperlipidemia Brother    Cancer Brother    Colon cancer Neg Hx     Prior to Admission medications   Medication Sig Start Date End Date Taking? Authorizing Provider  acetaminophen (TYLENOL) 325 MG tablet Take 2 tablets (650 mg total) by mouth every 6 (six) hours as needed for mild pain (or Fever >/= 101). 09/29/18   Roxan Hockey, MD  albuterol (PROVENTIL) (2.5 MG/3ML) 0.083% nebulizer solution Take 3 mLs (2.5 mg total) by nebulization 3 (three) times daily. X 5 days and then q 6 hrs prn after that 09/29/18   Roxan Hockey, MD  amLODipine (NORVASC) 10 MG tablet Take 1 tablet by mouth once daily 10/10/18   Arnoldo Lenis, MD  aspirin 325 MG tablet Take 1 tablet (325 mg total) by mouth daily. 11/16/17   Murlean Iba, MD  clopidogrel (PLAVIX) 75 MG tablet Take 1 tablet (75 mg total) by mouth daily. Patient taking differently: Take 75 mg by mouth every morning.  12/26/17   Arnoldo Lenis, MD  ezetimibe (ZETIA) 10 MG tablet Take 1 tablet by mouth once daily 10/10/18   Arnoldo Lenis, MD  metoprolol tartrate (LOPRESSOR) 25 MG tablet Take 25 mg by mouth every morning.  03/16/17   [provider]  nitroGLYCERIN (NITROSTAT) 0.4 MG SL tablet Place 1 tablet (0.4 mg total) under the tongue every 5 (five) minutes as needed for chest pain. 01/31/17 05/25/21  Lendon Colonel, NP  omeprazole (PRILOSEC) 20 MG capsule Take 20 mg by mouth every morning.     [provider]  PARoxetine (PAXIL) 20 MG tablet Take 20 mg by mouth at bedtime.     [provider]  spironolactone (ALDACTONE) 25 MG tablet Take 25 mg by mouth every morning.     [provider]    Physical Exam: Vitals:   10/13/18 1300 10/13/18 1315 10/13/18 1330 10/13/18 1515  BP: 110/60 (!) 95/58 113/76 124/67  Pulse: 69 66 66 66  Resp: (!) 27 (!) 26 (!) 24   Temp:      TempSrc:      SpO2: 99% 100% 98% 93%  Weight:        Constitutional: NAD, calm, comfortable Vitals:   10/13/18 1300 10/13/18 1315 10/13/18 1330 10/13/18 1515  BP: 110/60 (!) 95/58 113/76 124/67  Pulse: 69 66 66 66  Resp: (!) 27 (!) 26 (!) 24   Temp:      TempSrc:      SpO2: 99% 100% 98% 93%  Weight:       Eyes: PERRL, lids and conjunctivae normal ENMT: Mucous membranes are moist. Posterior pharynx clear of any exudate or lesions. Neck: normal, supple, no masses, no thyromegaly Respiratory: Trach collar present on room air.  He appears comfortable, diffuse crackles.  No wheezing.  No accessory muscle use. Cardiovascular: Regular rate and rhythm, no murmurs / rubs / gallops.  2+ pedal pulses.   Abdomen: no tenderness, no masses palpated. No hepatosplenomegaly. Bowel sounds positive.  Musculoskeletal: no clubbing / cyanosis. No joint deformity upper and lower extremities. Good ROM, no contractures. Normal muscle tone.  Skin: no rashes, lesions, ulcers. No induration Neurologic: CN 2-12 grossly intact.  Strength 5/5 in all 4.  Psychiatric: Normal judgment and insight. Alert and oriented x 3. Normal mood.   Labs on Admission: I have personally reviewed following labs and imaging studies  CBC: Recent Labs  Lab 10/13/18 1037  WBC 21.1*  NEUTROABS 19.3*  HGB 12.4*  HCT 39.0  MCV 87.8  PLT 026*   Basic Metabolic Panel: Recent Labs  Lab 10/13/18 1037  NA 133*  K 4.8  CL 96*  CO2 24  GLUCOSE 151*  BUN 18  CREATININE 1.45*  CALCIUM 9.6   Liver Function Tests: Recent Labs  Lab 10/13/18 1130  AST 24  ALT 25  ALKPHOS 124  BILITOT 0.8  PROT 8.0  ALBUMIN 3.3*   Coagulation Profile: Recent Labs  Lab 10/13/18 1037  INR 1.1   Cardiac Enzymes: Recent Labs  Lab  10/13/18 1037  TROPONINI <0.03    Urine analysis:    Component Value Date/Time   COLORURINE YELLOW 10/13/2018 1023   APPEARANCEUR CLEAR 10/13/2018 1023   LABSPEC 1.010 10/13/2018 1023   PHURINE 8.0 10/13/2018 1023   GLUCOSEU NEGATIVE 10/13/2018 1023   GLUCOSEU neg 06/01/2010 1113  HGBUR NEGATIVE 10/13/2018 [place] 10/13/2018 1023   [place] 10/13/2018 1023   PROTEINUR NEGATIVE 10/13/2018 1023   NITRITE NEGATIVE 10/13/2018 1023   LEUKOCYTESUR NEGATIVE 10/13/2018 1023    Radiological Exams on Admission: Ct Angio Chest Pe W/cm &/or Wo Cm  Result Date: 10/13/2018 CLINICAL DATA:  78 year old male with shortness of breath. EXAM: CT ANGIOGRAPHY CHEST WITH CONTRAST TECHNIQUE: Multidetector CT imaging of the chest was performed using the standard protocol during bolus administration of intravenous contrast. Multiplanar CT image reconstructions and MIPs were obtained to evaluate the vascular anatomy. CONTRAST:  51mL OMNIPAQUE IOHEXOL 350 MG/ML SOLN COMPARISON:  CT chest 09/25/2018 FINDINGS: Cardiovascular: Good contrast bolus timing in the pulmonary arterial tree. No focal filling defect identified in the pulmonary arteries to suggest acute pulmonary embolism. Prior CABG. Stable mild cardiomegaly. No pericardial effusion. Calcified aortic atherosclerosis. Patent partially visible right CCA vascular stent. Mediastinum/Nodes: Stable small mediastinal lymph nodes and sequelae of CABG. Lungs/Pleura: Stable tracheostomy with no adverse features. Major airways are patent although with mildly increased atelectatic changes. Continued widespread Patchy and confluent bilateral pulmonary ground-glass opacity. Progression in the right upper lobe since 09/25/2018 (series 6, image 28 today). Superimposed increased dependent atelectasis. Superimposed mosaic attenuation which is more apparent in the right lung and suggests gas trapping. No areas of improvement are identified. Trace  right pleural fluid or pleural thickening is stable. Upper Abdomen: Surgically absent gallbladder. Stable and negative visible liver, spleen, pancreas, adrenal glands, kidneys, and bowel. Musculoskeletal: No acute osseous abnormality identified. Flowing endplate osteophytes in the thoracic spine with some ankylosis. Prior sternotomy. Other findings: There is a degree of gynecomastia. Review of the MIP images confirms the above findings. IMPRESSION: 1. No evidence of acute pulmonary embolus. 2. No improvement in abnormal bilateral lung opacity since the CT on 09/25/2018, and right upper lobe involvement appears progressed. Underlying gas trapping and atelectasis. Favor acute viral/atypical respiratory infection superimposed on chronic lung disease. Noninfectious progression of chronic lung disease felt less likely. 3. 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 4. Aortic atherosclerosis status post CABG. Electronically Signed   By: Genevie Ann M.D.   On: 10/13/2018 15:13   Dg Chest Port 1 View  Result Date: 10/13/2018 CLINICAL DATA:  Chest pain and shortness of breath EXAM: PORTABLE CHEST 1 VIEW COMPARISON:  October 09, 2018 FINDINGS: Tracheostomy catheter tip is 5.6 cm above the carina. There is scarring in each mid lung region and right base. There is no appreciable edema or consolidation. Heart size and pulmonary vascularity are normal. Patient is status post internal mammary bypass grafting. No adenopathy. No bone lesions. A stent is noted in the right carotid region. IMPRESSION: Tracheostomy as described. No pneumothorax. Areas of scarring bilaterally, more on the right than on the left. No edema or consolidation. Stable cardiac silhouette. Right carotid stent noted. Electronically Signed   By: Lowella Grip III M.D.   On: 10/13/2018 10:40    EKG: Independently reviewed.  Sinus rhythm, QTC 412.  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  Assessment/Plan Active Problems:   Pneumonia   Pneumonia-shortness of  breath, fever 102, tachypneic, with significant leukocytosis 21 with neutrophil predominance 91%.  Rules in for sepsis with lactic acidosis 3 >>1.8 with fluids.  Influenza panel negative.  Chest CTA-no improvement in bilateral lung opacities, progression of right upper lobe involvement.  Acute viral/atypical respiratory infection superimposed on chronic lung disease favored.  2 recent admissions for pneumonias-treated with Levaquin, then IV meropenem and linezolid-discharged home on doxycycline  and Keflex completed 8 days.  Compared to chest CT 2018, chest findings are markedly different.  Had comprehensive swallow evaluation on recent hospitalization barium swallow, no aspiration was observed, possible reflux, was placed on regular diet with thin liquids, with reflux precautions. -Procalcitonin obtained and like on recent hospitalization- 0.1, antibiotic use discouraged -Obtain sputum cultures -Add on respiratory viral panel -Talked to infectious disease Dr. Lucianne Lei dam, and infectious disease prevention, will rule out [id number], for now droplet and contact precautions, ok with transfer to [facility]  Also presence of tracheostomy could increase his chances of getting recurrent lower respiratory tract infections -CBC BMP a.m. -At this time will continue broad-spectrum antibiotics with IV linezolid and meropenem -Will transfer to [facility] as patient may benefit from bronchoscopy -Will consult pulmonology- talked to Georgann Housekeeper with Critical care, patient will be seen in a.m - Mucolytics, supp O2. - NPO midnight pending Pulmonology eval in a.m - N/s 100cc/hr x 15 hrs  Laryngeal cancer, tracheostomy status8/2019-treated forlaryngealcancer 20 years ago with surgery and chemo, per care- everywhere.Tracheostomy for glottic stenosis,bilateral vocal cord paralysis and dyspnea. Patient on room air, but requires humidified O2 a few hours a day.Gets tracheostomy change every few months at [facility]. -Respiratory therapy consult  Hypertension-systolic 283M to 629U. -Continue home Aldactone, Norvasc, metoprolol  CVA, Carotidartery stenosis-stable.Has undergone carotid artery stenting. On dual antiplatelet. -Continue home aspirin and Plavix   DVT prophylaxis: Scds for now, pending Pulmonology eval Code Status: Full Family Communication:Spouse at bedside Disposition Plan: Per rounding team Consults called: No Admission status: Inpt, tele I certify that at the point of admission it is my clinical judgment that the patient will require inpatient hospital care spanning beyond 2 midnights from the point of admission due to high intensity of service, high risk for further deterioration and high frequency of surveillance required.  The following factors support the patient status of inpatient.  -Initial presentation with sepsis, likely need for prolonged course of antibiotics, pending blood cultures and viral testing results.   Bethena Roys MD [facility]  10/13/2018, 6:29 PM

## 2018-10-13 NOTE — ED Notes (Signed)
Date and time results received: 10/13/18 1413 (use smartphrase ".now" to insert current time)  Test: lactic acid  Critical Value: 2.2  Name of Provider Notified: Dr. Thurnell Garbe   Orders Received? Or Actions Taken?: n/a

## 2018-10-13 NOTE — ED Notes (Signed)
Pt unable to stand with orthostatic VS. Pt became increased sob and weak

## 2018-10-14 ENCOUNTER — Encounter (HOSPITAL_COMMUNITY): Payer: Self-pay

## 2018-10-14 DIAGNOSIS — J189 Pneumonia, unspecified organism: Secondary | ICD-10-CM

## 2018-10-14 LAB — CBC
HCT: 34.5 % — ABNORMAL LOW (ref 39.0–52.0)
Hemoglobin: 10.8 g/dL — ABNORMAL LOW (ref 13.0–17.0)
MCH: 27.4 pg (ref 26.0–34.0)
MCHC: 31.3 g/dL (ref 30.0–36.0)
MCV: 87.6 fL (ref 80.0–100.0)
Platelets: 362 10*3/uL (ref 150–400)
RBC: 3.94 MIL/uL — ABNORMAL LOW (ref 4.22–5.81)
RDW: 14.8 % (ref 11.5–15.5)
WBC: 16.8 10*3/uL — ABNORMAL HIGH (ref 4.0–10.5)
nRBC: 0 % (ref 0.0–0.2)

## 2018-10-14 LAB — BASIC METABOLIC PANEL
Anion gap: 9 (ref 5–15)
BUN: 14 mg/dL (ref 8–23)
CO2: 23 mmol/L (ref 22–32)
Calcium: 9 mg/dL (ref 8.9–10.3)
Chloride: 103 mmol/L (ref 98–111)
Creatinine, Ser: 1.19 mg/dL (ref 0.61–1.24)
GFR calc Af Amer: >60 mL/min (ref >60)
GFR calc non Af Amer: 58 mL/min — ABNORMAL LOW (ref >60)
Glucose, Bld: 115 mg/dL — ABNORMAL HIGH (ref 70–99)
Potassium: 4.5 mmol/L (ref 3.5–5.1)
Sodium: 135 mmol/L (ref 135–145)

## 2018-10-14 LAB — EXPECTORATED SPUTUM ASSESSMENT W GRAM STAIN, RFLX TO RESP C

## 2018-10-14 LAB — MRSA PCR SCREENING: MRSA by PCR: NEGATIVE

## 2018-10-14 MED ORDER — CLOPIDOGREL BISULFATE 75 MG PO TABS
75.0000 mg | ORAL_TABLET | Freq: Every day | ORAL | Status: DC
  Administered 2018-10-14 – 2018-10-17 (×4): 75 mg via ORAL
  Filled 2018-10-14 (×4): qty 1

## 2018-10-14 MED ORDER — METOPROLOL TARTRATE 25 MG PO TABS
25.0000 mg | ORAL_TABLET | Freq: Two times a day (BID) | ORAL | Status: DC
  Administered 2018-10-14 – 2018-10-17 (×6): 25 mg via ORAL
  Filled 2018-10-14 (×8): qty 1

## 2018-10-14 MED ORDER — TRAZODONE HCL 50 MG PO TABS
50.0000 mg | ORAL_TABLET | Freq: Once | ORAL | Status: AC
  Administered 2018-10-15: 50 mg via ORAL
  Filled 2018-10-14: qty 1

## 2018-10-14 MED ORDER — AMLODIPINE BESYLATE 10 MG PO TABS
10.0000 mg | ORAL_TABLET | Freq: Every day | ORAL | Status: DC
  Administered 2018-10-14 – 2018-10-17 (×4): 10 mg via ORAL
  Filled 2018-10-14 (×4): qty 1

## 2018-10-14 MED ORDER — ASPIRIN 325 MG PO TABS
325.0000 mg | ORAL_TABLET | Freq: Every day | ORAL | Status: DC
  Administered 2018-10-14 – 2018-10-17 (×4): 325 mg via ORAL
  Filled 2018-10-14 (×4): qty 1

## 2018-10-14 MED ORDER — ASPIRIN EC 325 MG PO TBEC: 325.0000 mg | DELAYED_RELEASE_TABLET | Freq: Every day | ORAL | Status: DC

## 2018-10-14 MED ORDER — CLOPIDOGREL BISULFATE 75 MG PO TABS: 75.0000 mg | ORAL_TABLET | Freq: Every day | ORAL | Status: DC

## 2018-10-14 MED ORDER — PAROXETINE HCL 20 MG PO TABS
20.0000 mg | ORAL_TABLET | Freq: Every day | ORAL | Status: DC
  Administered 2018-10-14 – 2018-10-17 (×4): 20 mg via ORAL
  Filled 2018-10-14 (×4): qty 1

## 2018-10-14 MED ORDER — EZETIMIBE 10 MG PO TABS
10.0000 mg | ORAL_TABLET | Freq: Every day | ORAL | Status: DC
  Administered 2018-10-14 – 2018-10-17 (×4): 10 mg via ORAL
  Filled 2018-10-14 (×4): qty 1

## 2018-10-14 MED ORDER — ENOXAPARIN SODIUM 40 MG/0.4ML ~~LOC~~ SOLN
40.0000 mg | SUBCUTANEOUS | Status: DC
  Administered 2018-10-14 – 2018-10-15 (×2): 40 mg via SUBCUTANEOUS
  Filled 2018-10-14 (×2): qty 0.4

## 2018-10-14 MED ORDER — PANTOPRAZOLE SODIUM 40 MG PO TBEC
40.0000 mg | DELAYED_RELEASE_TABLET | Freq: Every day | ORAL | Status: DC
  Administered 2018-10-14 – 2018-10-17 (×4): 40 mg via ORAL
  Filled 2018-10-14 (×4): qty 1

## 2018-10-14 MED ORDER — SPIRONOLACTONE 25 MG PO TABS
25.0000 mg | ORAL_TABLET | Freq: Every morning | ORAL | Status: DC
  Administered 2018-10-14 – 2018-10-16 (×3): 25 mg via ORAL
  Filled 2018-10-14 (×3): qty 1

## 2018-10-14 NOTE — Progress Notes (Signed)
PROGRESS NOTE    Kenneth [name]   WGN:[id number]  DOB: 1941-01-05  DOA: 10/13/2018 PCP: [name], MD   Brief Narrative:  Kenneth [name] is a 78 y.o. male with medical history significant for tracheostomy for laryngeal malignancy,HTN,carotid artery stenosis,CVA who presented to th ED with increasing difficulty breathing since discharge after recent hospitalization, with fever.   2/11-2/13- CAP-  Levaquin 3/2-3/6 - HCAP- Meropenem, Linezolid> dischaged on Keflex and Doxycycline  Now presents with fever 102.1, WBC 21, Lactic acidosis, tachypnea.   CTA chest > No improvement in abnormal bilateral lung opacities since the CT on 09/25/2018, and right upper lobe involvement appears progressed.   Started on IV aztreonam and linezolid  Subjective: Still has a cough. No other complaints.     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  H/o carotid stenting  cont anticoaulation  Time spent in minutes: 35 DVT prophylaxis: Lovenox Code Status: FUll code Family Communication: wife Disposition Plan: f/u on infection Consultants:   none Procedures:   none Antimicrobials:  Anti-infectives (From admission, onward)   Start     Dose/Rate Route Frequency Ordered Stop   10/13/18 2000  aztreonam (AZACTAM) 2 g in sodium chloride 0.9 % 100 mL IVPB  Status:  Discontinued     2 g 200 mL/hr over 30 Minutes Intravenous Every 8 hours 10/13/18 1325 10/13/18 1812   10/13/18 2000  meropenem (MERREM) 1 g in sodium chloride 0.9 % 100 mL IVPB     1 g 200 mL/hr over 30 Minutes Intravenous Every 12 hours 10/13/18 1817     10/13/18 1200  linezolid (ZYVOX) IVPB 600 mg     600 mg 300 mL/hr over 60  Minutes Intravenous Every 12 hours 10/13/18 1125     10/13/18 1100  aztreonam (AZACTAM) 2 g in sodium chloride 0.9 % 100 mL IVPB     2 g 200 mL/hr over 30 Minutes Intravenous  Once 10/13/18 1050 10/13/18 1248   10/13/18 1100  metroNIDAZOLE (FLAGYL) IVPB 500 mg  Status:  Discontinued     500 mg 100 mL/hr over 60 Minutes Intravenous  Once 10/13/18 1050 10/13/18 1817       Objective: Vitals:   10/13/18 2215 10/13/18 2300 10/14/18 0841 10/14/18 1507  BP:      Pulse:  68 68   Resp:   20   Temp:  98 F (36.7 C)    TempSrc:  Oral    SpO2:  93% 97% 98%  Weight: 80.5 kg 80.5 kg    Height: 5\' 5"  (1.651 m) 5\' 5"  (1.651 m)      Intake/Output Summary (Last 24 hours) at 10/14/2018 1638 Last data filed at 10/14/2018 0944 Gross per 24 hour  Intake 2767.9 ml  Output -  Net 2767.9 ml   Filed Weights   10/13/18 1248 10/13/18 2215 10/13/18 2300  Weight: 78.9 kg 80.5 kg 80.5 kg    Examination: General exam: Appears comfortable  HEENT: PERRLA, oral mucosa moist, no sclera icterus or thrush Respiratory system: Clear to auscultation. Respiratory effort normal. Cardiovascular system: S1 & S2 heard, RRR.   Gastrointestinal system: Abdomen soft, non-tender, nondistended. Normal bowel sounds. Central nervous system: Alert and oriented. No focal neurological  deficits. Extremities: No cyanosis, clubbing or edema Skin: No rashes or ulcers Psychiatry:  Mood & affect appropriate.     Data Reviewed: I have personally reviewed following labs and imaging studies  CBC: Recent Labs  Lab 10/13/18 1037 10/14/18 0229  WBC 21.1* 16.8*  NEUTROABS 19.3*  --   HGB 12.4* 10.8*  HCT 39.0 34.5*  MCV 87.8 87.6  PLT 479* 676   Basic Metabolic Panel: Recent Labs  Lab 10/13/18 1037 10/14/18 0229  NA 133* 135  K 4.8 4.5  CL 96* 103  CO2 24 23  GLUCOSE 151* 115*  BUN 18 14  CREATININE 1.45* 1.19  CALCIUM 9.6 9.0   GFR: Estimated Creatinine Clearance: 49.2 mL/min (by C-G formula based on SCr of  1.19 mg/dL). Liver Function Tests: Recent Labs  Lab 10/13/18 1130  AST 24  ALT 25  ALKPHOS 124  BILITOT 0.8  PROT 8.0  ALBUMIN 3.3*   No results for input(s): LIPASE, AMYLASE in the last 168 hours. No results for input(s): AMMONIA in the last 168 hours. Coagulation Profile: Recent Labs  Lab 10/13/18 1037  INR 1.1   Cardiac Enzymes: Recent Labs  Lab 10/13/18 1037  TROPONINI <0.03   BNP (last 3 results) No results for input(s): PROBNP in the last 8760 hours. HbA1C: No results for input(s): HGBA1C in the last 72 hours. CBG: No results for input(s): GLUCAP in the last 168 hours. Lipid Profile: No results for input(s): CHOL, HDL, LDLCALC, TRIG, CHOLHDL, LDLDIRECT in the last 72 hours. Thyroid Function Tests: No results for input(s): TSH, T4TOTAL, FREET4, T3FREE, THYROIDAB in the last 72 hours. Anemia Panel: No results for input(s): VITAMINB12, FOLATE, FERRITIN, TIBC, IRON, RETICCTPCT in the last 72 hours. Urine analysis:    Component Value Date/Time   COLORURINE YELLOW 10/13/2018 [place] 10/13/2018 1023   LABSPEC 1.010 10/13/2018 1023   PHURINE 8.0 10/13/2018 1023   GLUCOSEU NEGATIVE 10/13/2018 1023   GLUCOSEU neg 06/01/2010 1113   HGBUR NEGATIVE 10/13/2018 1023   [place] 10/13/2018 1023   [place] 10/13/2018 1023   PROTEINUR NEGATIVE 10/13/2018 1023   NITRITE NEGATIVE 10/13/2018 1023   LEUKOCYTESUR NEGATIVE 10/13/2018 1023   Sepsis Labs: @LABRCNTIP (procalcitonin:4,lacticidven:4) ) Recent Results (from the past 240 hour(s))  Respiratory Panel by PCR     Status: None   Collection Time: 10/13/18 10:41 AM  Result Value Ref Range Status   Adenovirus NOT DETECTED NOT DETECTED Final   Coronavirus 229E NOT DETECTED NOT DETECTED Final    Comment: (NOTE) The Coronavirus on the Respiratory Panel, DOES NOT test for the novel  Coronavirus (2019 nCoV)    Coronavirus HKU1 NOT DETECTED NOT DETECTED Final   Coronavirus NL63 NOT  DETECTED NOT DETECTED Final   Coronavirus OC43 NOT DETECTED NOT DETECTED Final   Metapneumovirus NOT DETECTED NOT DETECTED Final   Rhinovirus / Enterovirus NOT DETECTED NOT DETECTED Final   Influenza A NOT DETECTED NOT DETECTED Final   Influenza B NOT DETECTED NOT DETECTED Final   Parainfluenza Virus 1 NOT DETECTED NOT DETECTED Final   Parainfluenza Virus 2 NOT DETECTED NOT DETECTED Final   Parainfluenza Virus 3 NOT DETECTED NOT DETECTED Final   Parainfluenza Virus 4 NOT DETECTED NOT DETECTED Final   Respiratory Syncytial Virus NOT DETECTED NOT DETECTED Final   Bordetella pertussis NOT DETECTED NOT DETECTED Final   Chlamydophila pneumoniae NOT DETECTED NOT DETECTED Final   Mycoplasma pneumoniae NOT DETECTED NOT DETECTED Final    Comment: Performed at [facility]  Hospital Lab, [place] [address] [place], [place] [address]  Culture, blood (routine x 2)     Status: None (Preliminary result)   Collection Time: 10/13/18 11:23 AM  Result Value Ref Range Status   Specimen Description   Final    RIGHT ANTECUBITAL BOTTLES DRAWN AEROBIC AND ANAEROBIC   Special Requests Blood Culture adequate volume  Final   Culture   Final    NO GROWTH < 24 HOURS Performed at [facility], [address] [place], [place] [address]    Report Status PENDING  Incomplete  Culture, blood (routine x 2)     Status: None (Preliminary result)   Collection Time: 10/13/18 11:37 AM  Result Value Ref Range Status   Specimen Description   Final    BLOOD RIGHT WRIST BOTTLES DRAWN AEROBIC AND ANAEROBIC   Special Requests Blood Culture adequate volume  Final   Culture   Final    NO GROWTH < 24 HOURS Performed at [facility], [address] [place], [place] [address]    Report Status PENDING  Incomplete  Expectorated sputum assessment w rflx to resp cult     Status: None   Collection Time: 10/13/18  3:58 PM  Result Value Ref Range Status   Specimen Description   Final    SPUTUM Performed at [facility], [place] [address] [place], [place] [address]    Special Requests   Final    NONE Performed at [facility], [address], [place], [place] [address]    Sputum evaluation   Final    THIS SPECIMEN IS ACCEPTABLE FOR SPUTUM CULTURE Performed at [facility], [place] [address] [place], [place] [address]    Report Status 10/14/2018 FINAL  Final  Culture, respiratory     Status: None (Preliminary result)   Collection Time: 10/13/18  3:58 PM  Result Value Ref Range Status   Specimen Description SPUTUM  Final   Special Requests NONE Reflexed from [id number]  Final   Gram Stain   Final    ABUNDANT WBC PRESENT, PREDOMINANTLY PMN RARE YEAST Performed at [facility], [place] [address], [place], [place] [address]    Culture PENDING  Incomplete   Report Status PENDING  Incomplete  MRSA PCR Screening     Status: None   Collection Time: 10/14/18 11:39 AM  Result Value Ref Range Status   MRSA by PCR NEGATIVE NEGATIVE Final    Comment:        The GeneXpert MRSA Assay (FDA approved for NASAL specimens only), is one component of a comprehensive MRSA colonization surveillance program. It is not intended to diagnose MRSA infection nor to guide or monitor treatment for MRSA infections. Performed at [facility], [place] [address], [place], [place] [address]          Radiology Studies: Ct Angio Chest Pe W/cm &/or Wo Cm  Result Date: 10/13/2018 CLINICAL DATA:  78 year old male with shortness of breath. EXAM: CT ANGIOGRAPHY CHEST WITH CONTRAST TECHNIQUE: Multidetector CT imaging of the chest was performed using the standard protocol during bolus administration of intravenous contrast. Multiplanar CT image reconstructions and MIPs were obtained to evaluate the vascular anatomy. CONTRAST:  75mL OMNIPAQUE IOHEXOL 350 MG/ML SOLN COMPARISON:  CT chest 09/25/2018 FINDINGS: Cardiovascular: Good contrast bolus timing in the pulmonary arterial tree. No focal filling defect identified in the pulmonary arteries to  suggest acute pulmonary embolism. Prior CABG. Stable mild cardiomegaly. No pericardial effusion. Calcified aortic atherosclerosis. Patent  partially visible right CCA vascular stent. Mediastinum/Nodes: Stable small mediastinal lymph nodes and sequelae of CABG. Lungs/Pleura: Stable tracheostomy with no adverse features. Major airways are patent although with mildly increased atelectatic changes. Continued widespread Patchy and confluent bilateral pulmonary ground-glass opacity. Progression in the right upper lobe since 09/25/2018 (series 6, image 28 today). Superimposed increased dependent atelectasis. Superimposed mosaic attenuation which is more apparent in the right lung and suggests gas trapping. No areas of improvement are identified. Trace right pleural fluid or pleural thickening is stable. Upper Abdomen: Surgically absent gallbladder. Stable and negative visible liver, spleen, pancreas, adrenal glands, kidneys, and bowel. Musculoskeletal: No acute osseous abnormality identified. Flowing endplate osteophytes in the thoracic spine with some ankylosis. Prior sternotomy. Other findings: There is a degree of gynecomastia. Review of the MIP images confirms the above findings. IMPRESSION: 1. No evidence of acute pulmonary embolus. 2. No improvement in abnormal bilateral lung opacity since the CT on 09/25/2018, and right upper lobe involvement appears progressed. Underlying gas trapping and atelectasis. Favor acute viral/atypical respiratory infection superimposed on chronic lung disease. Noninfectious progression of chronic lung disease felt less likely. 3. 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 4. Aortic atherosclerosis status post CABG. Electronically Signed   By: Genevie Ann M.D.   On: 10/13/2018 15:13   Dg Chest Port 1 View  Result Date: 10/13/2018 CLINICAL DATA:  Chest pain and shortness of breath EXAM: PORTABLE CHEST 1 VIEW COMPARISON:  October 09, 2018 FINDINGS: Tracheostomy catheter tip is 5.6 cm above  the carina. There is scarring in each mid lung region and right base. There is no appreciable edema or consolidation. Heart size and pulmonary vascularity are normal. Patient is status post internal mammary bypass grafting. No adenopathy. No bone lesions. A stent is noted in the right carotid region. IMPRESSION: Tracheostomy as described. No pneumothorax. Areas of scarring bilaterally, more on the right than on the left. No edema or consolidation. Stable cardiac silhouette. Right carotid stent noted. Electronically Signed   By: Lowella Grip III M.D.   On: 10/13/2018 10:40      Scheduled Meds: . amLODipine  10 mg Oral Daily  . aspirin  325 mg Oral Daily  . clopidogrel  75 mg Oral Daily  . ezetimibe  10 mg Oral Daily  . metoprolol tartrate  25 mg Oral BID  . pantoprazole  40 mg Oral Daily  . PARoxetine  20 mg Oral QHS  . spironolactone  25 mg Oral q morning - 10a   Continuous Infusions: . linezolid (ZYVOX) IV 600 mg (10/14/18 1410)  . meropenem (MERREM) IV 1 g (10/14/18 0837)     LOS: 1 day      Debbe Odea, MD [facility] Pager: [email or web address] Password [facility] 10/14/2018, 4:38 PM

## 2018-10-15 LAB — URINE CULTURE: Culture: NO GROWTH

## 2018-10-15 LAB — PROCALCITONIN: Procalcitonin: <0.1 ng/mL

## 2018-10-15 MED ORDER — ENOXAPARIN SODIUM 40 MG/0.4ML ~~LOC~~ SOLN
40.0000 mg | SUBCUTANEOUS | Status: DC
  Administered 2018-10-16 – 2018-10-17 (×2): 40 mg via SUBCUTANEOUS
  Filled 2018-10-15 (×2): qty 0.4

## 2018-10-15 MED ORDER — DIPHENHYDRAMINE HCL 12.5 MG/5ML PO ELIX
25.0000 mg | ORAL_SOLUTION | Freq: Once | ORAL | Status: AC
  Administered 2018-10-15: 25 mg via ORAL
  Filled 2018-10-15: qty 10

## 2018-10-15 NOTE — Progress Notes (Signed)
PROGRESS NOTE    Kenneth [name]   VFI:[id number]  DOB: Jan 24, 1941  DOA: 10/13/2018 PCP: [name], MD   Brief Narrative:  Kenneth [name] is a 78 y.o. male with medical history significant for tracheostomy for laryngeal malignancy,HTN,carotid artery stenosis,CVA who presented to th ED with increasing difficulty breathing since discharge after recent hospitalization, with fever.    Hospitalizations:  2/11-2/13- [facility]-  Levaquin 3/2-3/6 - [facility]- Meropenem, Linezolid> dischaged on Keflex and Doxycycline  Now presents with fever 102.1, WBC 21, Lactic acidosis, tachypnea.   CTA chest > No improvement in abnormal bilateral lung opacities since the CT on 09/25/2018, and right upper lobe involvement appears progressed.   Started on IV aztreonam and linezolid.  Subjective: Continues to have a cough. No other complaints.     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  H/o carotid stenting  cont anticoaulation  Time spent in minutes: 35 DVT prophylaxis: Lovenox Code Status: FUll code Family Communication: wife Disposition Plan: f/u on infection Consultants:   none Procedures:   none Antimicrobials:  Anti-infectives (From admission, onward)   Start     Dose/Rate Route Frequency Ordered Stop   10/13/18 2000  aztreonam (AZACTAM) 2 g in sodium chloride 0.9 % 100 mL IVPB  Status:  Discontinued     2 g 200 mL/hr over 30 Minutes Intravenous Every 8 hours 10/13/18 1325 10/13/18 1812   10/13/18 2000  meropenem (MERREM) 1 g in sodium chloride 0.9 % 100 mL IVPB     1 g 200 mL/hr over 30 Minutes Intravenous Every 12 hours 10/13/18 1817     10/13/18 1200  linezolid (ZYVOX) IVPB 600 mg     600 mg 300 mL/hr over 60 Minutes Intravenous Every 12 hours 10/13/18 1125     10/13/18 1100  aztreonam (AZACTAM) 2 g in sodium chloride 0.9 % 100 mL IVPB     2 g 200 mL/hr over 30 Minutes Intravenous  Once 10/13/18 1050 10/13/18 1248   10/13/18 1100  metroNIDAZOLE (FLAGYL) IVPB 500 mg  Status:  Discontinued     500 mg 100 mL/hr over 60 Minutes Intravenous  Once 10/13/18 1050 10/13/18 1817       Objective: Vitals:   10/14/18 1951 10/14/18 2125 10/14/18 2335 10/15/18 0100  BP:  119/68  122/72  Pulse:  (!) 59 61 61  Resp:   18 16  Temp:   98 F (36.7 C)   TempSrc:   Oral   SpO2: 96%  97%   Weight:      Height:        Intake/Output Summary (Last 24 hours) at 10/15/2018 0820 Last data filed at 10/15/2018 0600 Gross per 24 hour  Intake 772.12 ml  Output 1070 ml  Net -297.88 ml   Filed Weights   10/13/18 1248 10/13/18 2215 10/13/18 2300  Weight: 78.9 kg 80.5 kg 80.5 kg    Examination: General exam: Appears comfortable  HEENT: PERRLA, oral mucosa moist, no sclera icterus or thrush- Trach present Respiratory system: b/l crackles at bases- Respiratory effort normal. Cardiovascular system: S1 & S2 heard,  No murmurs  Gastrointestinal system: Abdomen soft, non-tender, nondistended. Normal bowel sounds   Central nervous system: Alert and  oriented. No focal neurological deficits. Extremities: No cyanosis, clubbing or edema Skin: No rashes or ulcers Psychiatry:  Mood & affect appropriate.     Data Reviewed: I have personally reviewed following labs and imaging studies  CBC: Recent Labs  Lab 10/13/18 1037 10/14/18 0229  WBC 21.1* 16.8*  NEUTROABS 19.3*  --   HGB 12.4* 10.8*  HCT 39.0 34.5*  MCV 87.8 87.6  PLT 479* 440   Basic Metabolic Panel: Recent Labs  Lab 10/13/18 1037 10/14/18 0229  NA 133* 135  K 4.8 4.5  CL 96* 103  CO2 24 23  GLUCOSE 151* 115*  BUN 18 14  CREATININE 1.45* 1.19  CALCIUM 9.6 9.0   GFR: Estimated Creatinine Clearance: 49.2 mL/min (by  C-G formula based on SCr of 1.19 mg/dL). Liver Function Tests: Recent Labs  Lab 10/13/18 1130  AST 24  ALT 25  ALKPHOS 124  BILITOT 0.8  PROT 8.0  ALBUMIN 3.3*   No results for input(s): LIPASE, AMYLASE in the last 168 hours. No results for input(s): AMMONIA in the last 168 hours. Coagulation Profile: Recent Labs  Lab 10/13/18 1037  INR 1.1   Cardiac Enzymes: Recent Labs  Lab 10/13/18 1037  TROPONINI <0.03   BNP (last 3 results) No results for input(s): PROBNP in the last 8760 hours. HbA1C: No results for input(s): HGBA1C in the last 72 hours. CBG: No results for input(s): GLUCAP in the last 168 hours. Lipid Profile: No results for input(s): CHOL, HDL, LDLCALC, TRIG, CHOLHDL, LDLDIRECT in the last 72 hours. Thyroid Function Tests: No results for input(s): TSH, T4TOTAL, FREET4, T3FREE, THYROIDAB in the last 72 hours. Anemia Panel: No results for input(s): VITAMINB12, FOLATE, FERRITIN, TIBC, IRON, RETICCTPCT in the last 72 hours. Urine analysis:    Component Value Date/Time   COLORURINE YELLOW 10/13/2018 1023   APPEARANCEUR CLEAR 10/13/2018 1023   LABSPEC 1.010 10/13/2018 1023   PHURINE 8.0 10/13/2018 1023   GLUCOSEU NEGATIVE 10/13/2018 1023   GLUCOSEU neg 06/01/2010 1113   HGBUR NEGATIVE 10/13/2018 [place] 10/13/2018 [place] 10/13/2018 1023   PROTEINUR NEGATIVE 10/13/2018 1023   NITRITE NEGATIVE 10/13/2018 1023   LEUKOCYTESUR NEGATIVE 10/13/2018 1023   Sepsis Labs: @LABRCNTIP (procalcitonin:4,lacticidven:4) ) Recent Results (from the past 240 hour(s))  Urine culture     Status: None   Collection Time: 10/13/18 10:23 AM  Result Value Ref Range Status   Specimen Description   Final    URINE, CLEAN CATCH Performed at [facility], [address] [place], [place] [address]    Special Requests   Final    NONE Performed at [facility], [address] [place], [place] [address]    Culture   Final    NO  GROWTH Performed at [facility], [place] [address] [place], [place] [address]    Report Status 10/15/2018 FINAL  Final  Respiratory Panel by PCR     Status: None   Collection Time: 10/13/18 10:41 AM  Result Value Ref Range Status   Adenovirus NOT DETECTED NOT DETECTED Final   Coronavirus 229E NOT DETECTED NOT DETECTED Final    Comment: (NOTE) The Coronavirus on the Respiratory Panel, DOES NOT test for the novel  Coronavirus (2019 nCoV)    Coronavirus HKU1 NOT DETECTED NOT DETECTED Final   Coronavirus NL63 NOT DETECTED NOT DETECTED Final   Coronavirus OC43 NOT DETECTED NOT DETECTED Final   Metapneumovirus NOT DETECTED NOT DETECTED Final   Rhinovirus / Enterovirus NOT  DETECTED NOT DETECTED Final   Influenza A NOT DETECTED NOT DETECTED Final   Influenza B NOT DETECTED NOT DETECTED Final   Parainfluenza Virus 1 NOT DETECTED NOT DETECTED Final   Parainfluenza Virus 2 NOT DETECTED NOT DETECTED Final   Parainfluenza Virus 3 NOT DETECTED NOT DETECTED Final   Parainfluenza Virus 4 NOT DETECTED NOT DETECTED Final   Respiratory Syncytial Virus NOT DETECTED NOT DETECTED Final   Bordetella pertussis NOT DETECTED NOT DETECTED Final   Chlamydophila pneumoniae NOT DETECTED NOT DETECTED Final   Mycoplasma pneumoniae NOT DETECTED NOT DETECTED Final    Comment: Performed at [facility], [place] [address], [place], [place] [address]  Culture, blood (routine x 2)     Status: None (Preliminary result)   Collection Time: 10/13/18 11:23 AM  Result Value Ref Range Status   Specimen Description   Final    RIGHT ANTECUBITAL BOTTLES DRAWN AEROBIC AND ANAEROBIC   Special Requests Blood Culture adequate volume  Final   Culture   Final    NO GROWTH 2 DAYS Performed at [facility], [address], [place], [place] [address]    Report Status PENDING  Incomplete  Culture, blood (routine x 2)     Status: None (Preliminary result)   Collection Time: 10/13/18 11:37 AM  Result Value Ref Range Status    Specimen Description   Final    BLOOD RIGHT WRIST BOTTLES DRAWN AEROBIC AND ANAEROBIC   Special Requests Blood Culture adequate volume  Final   Culture   Final    NO GROWTH 2 DAYS Performed at [facility], [address], [place], [place] [address]    Report Status PENDING  Incomplete  Expectorated sputum assessment w rflx to resp cult     Status: None   Collection Time: 10/13/18  3:58 PM  Result Value Ref Range Status   Specimen Description   Final    SPUTUM Performed at [facility], [place] [address] [place], [place] [address]    Special Requests   Final    NONE Performed at [facility], [address], [place], [place] [address]    Sputum evaluation   Final    THIS SPECIMEN IS ACCEPTABLE FOR SPUTUM CULTURE Performed at [facility], [place] [address] [place], [place] [address]    Report Status 10/14/2018 FINAL  Final  Culture, respiratory     Status: None (Preliminary result)   Collection Time: 10/13/18  3:58 PM  Result Value Ref Range Status   Specimen Description SPUTUM  Final   Special Requests NONE Reflexed from [id number]  Final   Gram Stain   Final    ABUNDANT WBC PRESENT, PREDOMINANTLY PMN RARE YEAST Performed at [facility], [place] [address] [place], [place] [address]    Culture PENDING  Incomplete   Report Status PENDING  Incomplete  MRSA PCR Screening     Status: None   Collection Time: 10/14/18 11:39 AM  Result Value Ref Range Status   MRSA by PCR NEGATIVE NEGATIVE Final    Comment:        The GeneXpert MRSA Assay (FDA approved for NASAL specimens only), is one component of a comprehensive MRSA colonization surveillance program. It is not intended to diagnose MRSA infection nor to guide or monitor treatment for MRSA infections. Performed at [facility], [place] [address] [place], [place] [address]          Radiology Studies: Ct Angio Chest Pe W/cm &/  or Wo Cm  Result Date: 10/13/2018 CLINICAL DATA:  78 year old male with  shortness of breath. EXAM: CT ANGIOGRAPHY CHEST WITH CONTRAST TECHNIQUE: Multidetector CT imaging of the chest was performed using the standard protocol during bolus administration of intravenous contrast. Multiplanar CT image reconstructions and MIPs were obtained to evaluate the vascular anatomy. CONTRAST:  15mL OMNIPAQUE IOHEXOL 350 MG/ML SOLN COMPARISON:  CT chest 09/25/2018 FINDINGS: Cardiovascular: Good contrast bolus timing in the pulmonary arterial tree. No focal filling defect identified in the pulmonary arteries to suggest acute pulmonary embolism. Prior CABG. Stable mild cardiomegaly. No pericardial effusion. Calcified aortic atherosclerosis. Patent partially visible right CCA vascular stent. Mediastinum/Nodes: Stable small mediastinal lymph nodes and sequelae of CABG. Lungs/Pleura: Stable tracheostomy with no adverse features. Major airways are patent although with mildly increased atelectatic changes. Continued widespread Patchy and confluent bilateral pulmonary ground-glass opacity. Progression in the right upper lobe since 09/25/2018 (series 6, image 28 today). Superimposed increased dependent atelectasis. Superimposed mosaic attenuation which is more apparent in the right lung and suggests gas trapping. No areas of improvement are identified. Trace right pleural fluid or pleural thickening is stable. Upper Abdomen: Surgically absent gallbladder. Stable and negative visible liver, spleen, pancreas, adrenal glands, kidneys, and bowel. Musculoskeletal: No acute osseous abnormality identified. Flowing endplate osteophytes in the thoracic spine with some ankylosis. Prior sternotomy. Other findings: There is a degree of gynecomastia. Review of the MIP images confirms the above findings. IMPRESSION: 1. No evidence of acute pulmonary embolus. 2. No improvement in abnormal bilateral lung opacity since the CT on 09/25/2018, and right upper lobe involvement appears progressed. Underlying gas trapping and  atelectasis. Favor acute viral/atypical respiratory infection superimposed on chronic lung disease. Noninfectious progression of chronic lung disease felt less likely. 3. 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 4. Aortic atherosclerosis status post CABG. Electronically Signed   By: Genevie Ann M.D.   On: 10/13/2018 15:13   Dg Chest Port 1 View  Result Date: 10/13/2018 CLINICAL DATA:  Chest pain and shortness of breath EXAM: PORTABLE CHEST 1 VIEW COMPARISON:  October 09, 2018 FINDINGS: Tracheostomy catheter tip is 5.6 cm above the carina. There is scarring in each mid lung region and right base. There is no appreciable edema or consolidation. Heart size and pulmonary vascularity are normal. Patient is status post internal mammary bypass grafting. No adenopathy. No bone lesions. A stent is noted in the right carotid region. IMPRESSION: Tracheostomy as described. No pneumothorax. Areas of scarring bilaterally, more on the right than on the left. No edema or consolidation. Stable cardiac silhouette. Right carotid stent noted. Electronically Signed   By: Lowella Grip III M.D.   On: 10/13/2018 10:40      Scheduled Meds:  amLODipine  10 mg Oral Daily   aspirin  325 mg Oral Daily   clopidogrel  75 mg Oral Daily   enoxaparin (LOVENOX) injection  40 mg Subcutaneous Q24H   ezetimibe  10 mg Oral Daily   metoprolol tartrate  25 mg Oral BID   pantoprazole  40 mg Oral Daily   PARoxetine  20 mg Oral QHS   spironolactone  25 mg Oral q morning - 10a   Continuous Infusions:  linezolid (ZYVOX) IV 600 mg (10/15/18 0511)   meropenem (MERREM) IV Stopped (10/14/18 2140)     LOS: 2 days      Debbe Odea, MD [facility] Pager: [email or web address] Password [email or web address] 10/15/2018, 8:20 AM

## 2018-10-15 NOTE — Progress Notes (Signed)
I have expressed to Pt and Wife on multiple occassions that pt should keep ATC for humidity to keep secretions mobile.  Yet when I go into the room to do routine trach checks pt has taken ATC off.

## 2018-10-16 DIAGNOSIS — R0602 Shortness of breath: Secondary | ICD-10-CM

## 2018-10-16 DIAGNOSIS — Z885 Allergy status to narcotic agent status: Secondary | ICD-10-CM

## 2018-10-16 DIAGNOSIS — N179 Acute kidney failure, unspecified: Secondary | ICD-10-CM

## 2018-10-16 DIAGNOSIS — R05 Cough: Secondary | ICD-10-CM

## 2018-10-16 DIAGNOSIS — Z8521 Personal history of malignant neoplasm of larynx: Secondary | ICD-10-CM

## 2018-10-16 DIAGNOSIS — Z9002 Acquired absence of larynx: Secondary | ICD-10-CM

## 2018-10-16 DIAGNOSIS — R093 Abnormal sputum: Secondary | ICD-10-CM

## 2018-10-16 DIAGNOSIS — Z87891 Personal history of nicotine dependence: Secondary | ICD-10-CM

## 2018-10-16 DIAGNOSIS — Z888 Allergy status to other drugs, medicaments and biological substances status: Secondary | ICD-10-CM

## 2018-10-16 DIAGNOSIS — Z880 Allergy status to penicillin: Secondary | ICD-10-CM

## 2018-10-16 DIAGNOSIS — R918 Other nonspecific abnormal finding of lung field: Secondary | ICD-10-CM

## 2018-10-16 DIAGNOSIS — Z881 Allergy status to other antibiotic agents status: Secondary | ICD-10-CM

## 2018-10-16 DIAGNOSIS — R509 Fever, unspecified: Secondary | ICD-10-CM

## 2018-10-16 DIAGNOSIS — Z930 Tracheostomy status: Secondary | ICD-10-CM

## 2018-10-16 LAB — CBC
HCT: 33.8 % — ABNORMAL LOW (ref 39.0–52.0)
Hemoglobin: 10.8 g/dL — ABNORMAL LOW (ref 13.0–17.0)
MCH: 28.2 pg (ref 26.0–34.0)
MCHC: 32 g/dL (ref 30.0–36.0)
MCV: 88.3 fL (ref 80.0–100.0)
Platelets: 353 10*3/uL (ref 150–400)
RBC: 3.83 MIL/uL — ABNORMAL LOW (ref 4.22–5.81)
RDW: 14.8 % (ref 11.5–15.5)
WBC: 8.2 10*3/uL (ref 4.0–10.5)
nRBC: 0 % (ref 0.0–0.2)

## 2018-10-16 LAB — CREATININE, SERUM
Creatinine, Ser: 1.49 mg/dL — ABNORMAL HIGH (ref 0.61–1.24)
GFR calc Af Amer: 51 mL/min — ABNORMAL LOW (ref >60)
GFR calc non Af Amer: 44 mL/min — ABNORMAL LOW (ref >60)

## 2018-10-16 MED ORDER — ZOLPIDEM TARTRATE 5 MG PO TABS
5.0000 mg | ORAL_TABLET | Freq: Once | ORAL | Status: AC
  Administered 2018-10-16: 5 mg via ORAL
  Filled 2018-10-16: qty 1

## 2018-10-16 NOTE — Consult Note (Addendum)
[facility]    Date of Admission:  10/13/2018           Day 4 linezolid        Day 4 meropenem       Reason for Consult: Possible Covid-19 pneumonia    Referring Provider: Dr. Debbe Odea  Assessment: The cause of his recent fever and progressive groundglass opacities remains unclear.  The prompt resolution of his fever on broad empiric antibiotics suggests a bacterial process but I agree with concerns for possible Covid-19.  Given negative MRSA PCR I would stop linezolid now.  Plan: 1. Continue meropenem for 2 more days 2. Discontinue linezolid 3. Continue airborne and contact precautions pending results of Covid-19 testing  Principal Problem:   HCAP (healthcare-associated pneumonia) Active Problems:   Essential hypertension   Tracheostomy dependent (HCC)   Scheduled Meds:  amLODipine  10 mg Oral Daily   aspirin  325 mg Oral Daily   clopidogrel  75 mg Oral Daily   enoxaparin (LOVENOX) injection  40 mg Subcutaneous Q24H   ezetimibe  10 mg Oral Daily   metoprolol tartrate  25 mg Oral BID   pantoprazole  40 mg Oral Daily   PARoxetine  20 mg Oral QHS   spironolactone  25 mg Oral q morning - 10a   Continuous Infusions:  linezolid (ZYVOX) IV 600 mg (10/16/18 0338)   meropenem (MERREM) IV 1 g (10/16/18 0830)   PRN Meds:.acetaminophen **OR** acetaminophen, guaiFENesin, ondansetron **OR** ondansetron (ZOFRAN) IV, polyethylene glycol  HPI: Kenneth [name] is a 78 y.o. 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  He has had 2 recent hospitalizations for pneumonia.  He says that he felt better after being discharged on 09/29/2018 to complete oral cephalexin and doxycycline.  Last week he began to have more problems with fever and worsening shortness of breath leading to readmission on 10/13/2018.  Chest x-ray and CT scan showed patchy bilateral groundglass opacities breast since with the last CT  scan on 09/25/2018.  Influenza and respiratory virus panel have been negative.  Blood cultures are negative.  Sputum stain shows some rare yeast.  Sputum culture is pending.  He was started on empiric linezolid and meropenem.  His initial temperature was 102.1 degrees but he promptly defervesced.  He says that he is feeling better.   Review of Systems: Review of Systems  Constitutional: Positive for fever. Negative for chills and diaphoresis.  Respiratory: Positive for cough, sputum production and shortness of breath. Negative for wheezing.   Cardiovascular: Negative for chest pain.    Past Medical History:  Diagnosis Date   Anxiety    Arthritis    Back (05/04/2018)   ASCVD (arteriosclerotic cardiovascular disease)    Chronic lower back pain    "have it at night" (05/04/2018)   Coronary artery disease    a. 2003: s/p CABG x3V  b. 2007: cath with patent bypass grafts.  c. 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   Malodorous urine    "in the last 5wks; since I had trach put in" (05/04/2018)   Peripheral vascular  disease ([place])    Stroke [facility])    "he's had several little strokes; many that he wasn't aware of" (05/04/2018)   Tobacco abuse    Tracheal stenosis     Social History   Tobacco Use   Smoking status: Former Smoker    Packs/day: 1.00    Years: 15.00    Pack years: 15.00    Types: Cigarettes    Start date: 04/22/1953    Last attempt to quit: 07/26/1970    Years since quitting: 48.2   Smokeless tobacco: Former Systems developer    Types: Chew    Quit date: 05/05/2002  Substance Use Topics   Alcohol use: Never    Alcohol/week: 0.0 standard drinks    Frequency: Never   Drug  use: Never    Family History  Problem Relation Age of Onset   Dementia Mother    Heart disease Father    Cancer Brother    Heart disease Brother    Hyperlipidemia Brother    Cancer Brother    Colon cancer Neg Hx    Allergies  Allergen Reactions   Nifedipine Other (See Comments)   Lorazepam Other (See Comments)    REACTION: Alters mental status. "Turns into maniac" REACTION: Alters mental status. "Turns into maniac" REACTION: Alters mental status. "Turns into maniac"   Other     REACTION: Unknown to patient. States that MD states allergy per medical records   Penicillins Hives    Has patient had a PCN reaction causing immediate rash, facial/tongue/throat swelling, SOB or lightheadedness with hypotension: Yes Has patient had a PCN reaction causing severe rash involving mucus membranes or skin necrosis: No Has patient had a PCN reaction that required hospitalization No Has patient had a PCN reaction occurring within the last 10 years: No If all of the above answers are "NO", then may proceed with Cephalosporin use.  Has patient had a PCN reaction causing immediate rash, facial/tongue/throat swelling, SOB or lightheadedness with hypotension: Yes Has patient had a PCN reaction causing severe rash involving mucus membranes or skin necrosis: No Has patient had a PCN reaction that required hospitalization No Has patient had a PCN reaction occurring within the last 10 years: No If all of the above answers are "NO", then may proceed with Cephalosporin use.   Sulfacetamide     REACTION: Unknown to patient. States that MD states allergy per medical records   Sulfonamide Derivatives Other (See Comments)    REACTION: Unknown to patient. States that MD states allergy per medical records   Erythromycin Rash   Statins Itching and Rash   Vancomycin Itching    OBJECTIVE: Blood pressure (!) 150/78, pulse (!) 53, temperature 98.3 F (36.8 C), temperature source Oral, resp.  rate 16, height 5\' 5"  (1.651 m), weight 80.5 kg, SpO2 92 %.  Physical Exam Constitutional:      Comments: He is resting quietly in bed.  He is in no distress.  Cardiovascular:     Rate and Rhythm: Normal rate and regular rhythm.     Heart sounds: No murmur.  Pulmonary:     Effort: Pulmonary effort is normal.     Breath sounds: No wheezing, rhonchi or rales.     Comments: Breath sounds very difficult to evaluate given the noise of the negative pressure blower.    Lab Results Lab Results  Component Value Date   WBC 8.2 10/16/2018   HGB 10.8 (L) 10/16/2018   HCT 33.8 (L) 10/16/2018   MCV 88.3 10/16/2018  PLT 353 10/16/2018    Lab Results  Component Value Date   CREATININE 1.49 (H) 10/16/2018   BUN 14 10/14/2018   NA 135 10/14/2018   K 4.5 10/14/2018   CL 103 10/14/2018   CO2 23 10/14/2018    Lab Results  Component Value Date   ALT 25 10/13/2018   AST 24 10/13/2018   ALKPHOS 124 10/13/2018   BILITOT 0.8 10/13/2018     Microbiology: Recent Results (from the past 240 hour(s))  Urine culture     Status: None   Collection Time: 10/13/18 10:23 AM  Result Value Ref Range Status   Specimen Description   Final    URINE, CLEAN CATCH Performed at [facility], [address], [place], [place] [address]    Special Requests   Final    NONE Performed at [facility], [address], [place], [place] [address]    Culture   Final    NO GROWTH Performed at [facility], [place] [address], [place], [place] [address]    Report Status 10/15/2018 FINAL  Final  Respiratory Panel by PCR     Status: None   Collection Time: 10/13/18 10:41 AM  Result Value Ref Range Status   Adenovirus NOT DETECTED NOT DETECTED Final   Coronavirus 229E NOT DETECTED NOT DETECTED Final    Comment: (NOTE) The Coronavirus on the Respiratory Panel, DOES NOT test for the novel  Coronavirus (2019 nCoV)    Coronavirus HKU1 NOT DETECTED NOT DETECTED Final   Coronavirus NL63 NOT DETECTED NOT DETECTED  Final   Coronavirus OC43 NOT DETECTED NOT DETECTED Final   Metapneumovirus NOT DETECTED NOT DETECTED Final   Rhinovirus / Enterovirus NOT DETECTED NOT DETECTED Final   Influenza A NOT DETECTED NOT DETECTED Final   Influenza B NOT DETECTED NOT DETECTED Final   Parainfluenza Virus 1 NOT DETECTED NOT DETECTED Final   Parainfluenza Virus 2 NOT DETECTED NOT DETECTED Final   Parainfluenza Virus 3 NOT DETECTED NOT DETECTED Final   Parainfluenza Virus 4 NOT DETECTED NOT DETECTED Final   Respiratory Syncytial Virus NOT DETECTED NOT DETECTED Final   Bordetella pertussis NOT DETECTED NOT DETECTED Final   Chlamydophila pneumoniae NOT DETECTED NOT DETECTED Final   Mycoplasma pneumoniae NOT DETECTED NOT DETECTED Final    Comment: Performed at [facility], [place] [address] [place], [place] [address]  Culture, blood (routine x 2)     Status: None (Preliminary result)   Collection Time: 10/13/18 11:23 AM  Result Value Ref Range Status   Specimen Description   Final    RIGHT ANTECUBITAL BOTTLES DRAWN AEROBIC AND ANAEROBIC   Special Requests Blood Culture adequate volume  Final   Culture   Final    NO GROWTH 3 DAYS Performed at [facility], [address] [place], [place] [address]    Report Status PENDING  Incomplete  Culture, blood (routine x 2)     Status: None (Preliminary result)   Collection Time: 10/13/18 11:37 AM  Result Value Ref Range Status   Specimen Description   Final    BLOOD RIGHT WRIST BOTTLES DRAWN AEROBIC AND ANAEROBIC   Special Requests Blood Culture adequate volume  Final   Culture   Final    NO GROWTH 3 DAYS Performed at [facility], [address], [place], [place] [address]    Report Status PENDING  Incomplete  Expectorated sputum assessment w rflx to resp cult     Status: None   Collection Time:  10/13/18  3:58 PM  Result Value Ref Range Status   Specimen Description   Final    SPUTUM Performed at [facility], [place] [address], [place], [place] [address]      Special Requests   Final    NONE Performed at [facility], [address], [place], [place] [address]    Sputum evaluation   Final    THIS SPECIMEN IS ACCEPTABLE FOR SPUTUM CULTURE Performed at [facility], [place] [address] [place], [place] [address]    Report Status 10/14/2018 FINAL  Final  Culture, respiratory     Status: None (Preliminary result)   Collection Time: 10/13/18  3:58 PM  Result Value Ref Range Status   Specimen Description SPUTUM  Final   Special Requests NONE Reflexed from [id number]  Final   Gram Stain   Final    ABUNDANT WBC PRESENT, PREDOMINANTLY PMN RARE YEAST    Culture   Final    CULTURE REINCUBATED FOR BETTER GROWTH Performed at [facility], [place] [address] [place], [place] [address]    Report Status PENDING  Incomplete  MRSA PCR Screening     Status: None   Collection Time: 10/14/18 11:39 AM  Result Value Ref Range Status   MRSA by PCR NEGATIVE NEGATIVE Final    Comment:        The GeneXpert MRSA Assay (FDA approved for NASAL specimens only), is one component of a comprehensive MRSA colonization surveillance program. It is not intended to diagnose MRSA infection nor to guide or monitor treatment for MRSA infections. Performed at [facility], [place] [address] [place], [place] [address]     Michel Bickers, [facility] [phone] pager   [phone] cell 10/16/2018, 9:50 AM

## 2018-10-16 NOTE — Progress Notes (Signed)
PROGRESS NOTE    Kenneth [name]   DPO:[id number]  DOB: 1941/02/07  DOA: 10/13/2018 PCP: [name], MD   Brief Narrative:  Kenneth [name] is a 78 y.o. male with medical history significant for tracheostomy for laryngeal malignancy,HTN,carotid artery stenosis,CVA who presented to th ED with increasing difficulty breathing since discharge after recent hospitalization, with fever.   2/11-2/13- CAP-  Levaquin 3/2-3/6 - HCAP- Meropenem, Linezolid> dischaged on Keflex and Doxycycline  Now presents with fever 102.1, WBC 21, Lactic acidosis, tachypnea.   CTA chest > No improvement in abnormal bilateral lung opacities since the CT on 09/25/2018, and right upper lobe involvement appears progressed.   Started on IV aztreonam and linezolid  Subjective: He coughed up a lot of sputum this AM, yellow in color.     Assessment & Plan:   Principal Problem:   HCAP (healthcare-associated pneumonia)  - started on Merrem, Zyvox and given 1 dose of Azactam on 3/20 - MRSA PCR neg- d/c Zyvox- ID recommend to continue Merrem for 2 more days - neg blood cultures - also being ruled out for COVID 19  Active Problems:   Essential hypertension- AKI - home meds including Aldactone resumed- Cr up today- hold diuretics (given this AM)    Tracheostomy dependent (HCC) - s/p laryngeal cancer  H/o carotid stenting  cont anticoaulation  Time spent in minutes: 35 DVT prophylaxis: Lovenox Code Status: FUll code Family Communication: wife Disposition Plan: f/u on infection Consultants:   none Procedures:   none Antimicrobials:  Anti-infectives (From admission, onward)   Start     Dose/Rate Route Frequency Ordered Stop   10/13/18 2000  aztreonam (AZACTAM) 2 g in sodium chloride 0.9 % 100 mL IVPB  Status:  Discontinued     2 g 200 mL/hr over 30 Minutes Intravenous Every 8 hours 10/13/18 1325 10/13/18 1812   10/13/18 2000  meropenem (MERREM) 1 g in sodium chloride 0.9 % 100 mL IVPB     1 g 200  mL/hr over 30 Minutes Intravenous Every 12 hours 10/13/18 1817     10/13/18 1200  linezolid (ZYVOX) IVPB 600 mg  Status:  Discontinued     600 mg 300 mL/hr over 60 Minutes Intravenous Every 12 hours 10/13/18 1125 10/16/18 1012   10/13/18 1100  aztreonam (AZACTAM) 2 g in sodium chloride 0.9 % 100 mL IVPB     2 g 200 mL/hr over 30 Minutes Intravenous  Once 10/13/18 1050 10/13/18 1248   10/13/18 1100  metroNIDAZOLE (FLAGYL) IVPB 500 mg  Status:  Discontinued     500 mg 100 mL/hr over 60 Minutes Intravenous  Once 10/13/18 1050 10/13/18 1817       Objective: Vitals:   10/15/18 1935 10/15/18 2130 10/16/18 0900 10/16/18 1213  BP:  (!) 150/78    Pulse:  79 (!) 53 (!) 50  Resp:   16 16  Temp:      TempSrc:      SpO2: 93%  92% 92%  Weight:      Height:        Intake/Output Summary (Last 24 hours) at 10/16/2018 1510 Last data filed at 10/16/2018 1509 Gross per 24 hour  Intake 1240 ml  Output 250 ml  Net 990 ml   Filed Weights   10/13/18 1248 10/13/18 2215 10/13/18 2300  Weight: 78.9 kg 80.5 kg 80.5 kg    Examination: General exam: Appears comfortable  HEENT: PERRLA, oral mucosa moist, no sclera icterus or thrush- trach present Respiratory system: Clear  to auscultation. Respiratory effort normal. Cardiovascular system: S1 & S2 heard,  No murmurs  Gastrointestinal system: Abdomen soft, non-tender, nondistended. Normal bowel sounds   Central nervous system: Alert and oriented. No focal neurological deficits. Extremities: No cyanosis, clubbing or edema Skin: No rashes or ulcers Psychiatry:  Mood & affect appropriate.     Data Reviewed: I have personally reviewed following labs and imaging studies  CBC: Recent Labs  Lab 10/13/18 1037 10/14/18 0229 10/16/18 0403  WBC 21.1* 16.8* 8.2  NEUTROABS 19.3*  --   --   HGB 12.4* 10.8* 10.8*  HCT 39.0 34.5* 33.8*  MCV 87.8 87.6 88.3  PLT 479* 362 989   Basic Metabolic Panel: Recent Labs  Lab 10/13/18 1037 10/14/18 0229  10/16/18 0403  NA 133* 135  --   K 4.8 4.5  --   CL 96* 103  --   CO2 24 23  --   GLUCOSE 151* 115*  --   BUN 18 14  --   CREATININE 1.45* 1.19 1.49*  CALCIUM 9.6 9.0  --    GFR: Estimated Creatinine Clearance: 39.3 mL/min (A) (by C-G formula based on SCr of 1.49 mg/dL (H)). Liver Function Tests: Recent Labs  Lab 10/13/18 1130  AST 24  ALT 25  ALKPHOS 124  BILITOT 0.8  PROT 8.0  ALBUMIN 3.3*   No results for input(s): LIPASE, AMYLASE in the last 168 hours. No results for input(s): AMMONIA in the last 168 hours. Coagulation Profile: Recent Labs  Lab 10/13/18 1037  INR 1.1   Cardiac Enzymes: Recent Labs  Lab 10/13/18 1037  TROPONINI <0.03   BNP (last 3 results) No results for input(s): PROBNP in the last 8760 hours. HbA1C: No results for input(s): HGBA1C in the last 72 hours. CBG: No results for input(s): GLUCAP in the last 168 hours. Lipid Profile: No results for input(s): CHOL, HDL, LDLCALC, TRIG, CHOLHDL, LDLDIRECT in the last 72 hours. Thyroid Function Tests: No results for input(s): TSH, T4TOTAL, FREET4, T3FREE, THYROIDAB in the last 72 hours. Anemia Panel: No results for input(s): VITAMINB12, FOLATE, FERRITIN, TIBC, IRON, RETICCTPCT in the last 72 hours. Urine analysis:    Component Value Date/Time   COLORURINE YELLOW 10/13/2018 1023   APPEARANCEUR CLEAR 10/13/2018 1023   LABSPEC 1.010 10/13/2018 1023   PHURINE 8.0 10/13/2018 1023   GLUCOSEU NEGATIVE 10/13/2018 1023   GLUCOSEU neg 06/01/2010 1113   HGBUR NEGATIVE 10/13/2018 [place] 10/13/2018 [place] 10/13/2018 1023   PROTEINUR NEGATIVE 10/13/2018 1023   NITRITE NEGATIVE 10/13/2018 1023   LEUKOCYTESUR NEGATIVE 10/13/2018 1023   Sepsis Labs: @LABRCNTIP (procalcitonin:4,lacticidven:4) ) Recent Results (from the past 240 hour(s))  Urine culture     Status: None   Collection Time: 10/13/18 10:23 AM  Result Value Ref Range Status   Specimen Description    Final    URINE, CLEAN CATCH Performed at [facility], [address] [place], [place] [address]    Special Requests   Final    NONE Performed at [facility], [address] [place], [place] [address]    Culture   Final    NO GROWTH Performed at [facility], [place] [address], [place],  [address]    Report Status 10/15/2018 FINAL  Final  Respiratory Panel by PCR     Status: None   Collection Time: 10/13/18 10:41 AM  Result Value Ref Range Status   Adenovirus NOT DETECTED NOT DETECTED Final   Coronavirus 229E NOT  DETECTED NOT DETECTED Final    Comment: (NOTE) The Coronavirus on the Respiratory Panel, DOES NOT test for the novel  Coronavirus (2019 nCoV)    Coronavirus HKU1 NOT DETECTED NOT DETECTED Final   Coronavirus NL63 NOT DETECTED NOT DETECTED Final   Coronavirus OC43 NOT DETECTED NOT DETECTED Final   Metapneumovirus NOT DETECTED NOT DETECTED Final   Rhinovirus / Enterovirus NOT DETECTED NOT DETECTED Final   Influenza A NOT DETECTED NOT DETECTED Final   Influenza B NOT DETECTED NOT DETECTED Final   Parainfluenza Virus 1 NOT DETECTED NOT DETECTED Final   Parainfluenza Virus 2 NOT DETECTED NOT DETECTED Final   Parainfluenza Virus 3 NOT DETECTED NOT DETECTED Final   Parainfluenza Virus 4 NOT DETECTED NOT DETECTED Final   Respiratory Syncytial Virus NOT DETECTED NOT DETECTED Final   Bordetella pertussis NOT DETECTED NOT DETECTED Final   Chlamydophila pneumoniae NOT DETECTED NOT DETECTED Final   Mycoplasma pneumoniae NOT DETECTED NOT DETECTED Final    Comment: Performed at [facility], [place] [address] [place], [place] [address]  Culture, blood (routine x 2)     Status: None (Preliminary result)   Collection Time: 10/13/18 11:23 AM  Result Value Ref Range Status   Specimen Description   Final    RIGHT ANTECUBITAL BOTTLES DRAWN AEROBIC AND ANAEROBIC   Special Requests Blood Culture adequate volume  Final   Culture   Final    NO GROWTH 3 DAYS Performed at  [facility], [address], [place], [place] [address]    Report Status PENDING  Incomplete  Culture, blood (routine x 2)     Status: None (Preliminary result)   Collection Time: 10/13/18 11:37 AM  Result Value Ref Range Status   Specimen Description   Final    BLOOD RIGHT WRIST BOTTLES DRAWN AEROBIC AND ANAEROBIC   Special Requests Blood Culture adequate volume  Final   Culture   Final    NO GROWTH 3 DAYS Performed at [facility], [address] [place], [place] [address]    Report Status PENDING  Incomplete  Expectorated sputum assessment w rflx to resp cult     Status: None   Collection Time: 10/13/18  3:58 PM  Result Value Ref Range Status   Specimen Description   Final    SPUTUM Performed at [facility], [place] [address] [place], [place] [address]    Special Requests   Final    NONE Performed at [facility], [address], [place], [place] [address]    Sputum evaluation   Final    THIS SPECIMEN IS ACCEPTABLE FOR SPUTUM CULTURE Performed at [facility], [place] [address], [place], [place] [address]    Report Status 10/14/2018 FINAL  Final  Culture, respiratory     Status: None (Preliminary result)   Collection Time: 10/13/18  3:58 PM  Result Value Ref Range Status   Specimen Description SPUTUM  Final   Special Requests NONE Reflexed from [id number]  Final   Gram Stain   Final    ABUNDANT WBC PRESENT, PREDOMINANTLY PMN RARE YEAST    Culture   Final    CULTURE REINCUBATED FOR BETTER GROWTH Performed at [facility], [place] [address] [place],  [address]    Report Status PENDING  Incomplete  MRSA PCR Screening     Status: None   Collection Time: 10/14/18 11:39 AM  Result Value Ref Range Status   MRSA by PCR NEGATIVE NEGATIVE Final  Comment:        The GeneXpert MRSA Assay (FDA approved for NASAL specimens only), is one component of a comprehensive MRSA colonization surveillance program. It is not intended to diagnose MRSA infection nor to  guide or monitor treatment for MRSA infections. Performed at [facility], [place] [address] [place], [place] [address]          Radiology Studies: No results found.    Scheduled Meds: . amLODipine  10 mg Oral Daily  . aspirin  325 mg Oral Daily  . clopidogrel  75 mg Oral Daily  . enoxaparin (LOVENOX) injection  40 mg Subcutaneous Q24H  . ezetimibe  10 mg Oral Daily  . metoprolol tartrate  25 mg Oral BID  . pantoprazole  40 mg Oral Daily  . PARoxetine  20 mg Oral QHS  . spironolactone  25 mg Oral q morning - 10a   Continuous Infusions: . meropenem (MERREM) IV 1 g (10/16/18 0830)     LOS: 3 days      Debbe Odea, MD [facility] Pager: [email or web address] Password [email or web address] 10/16/2018, 3:10 PM

## 2018-10-17 DIAGNOSIS — Z8701 Personal history of pneumonia (recurrent): Secondary | ICD-10-CM

## 2018-10-17 DIAGNOSIS — J189 Pneumonia, unspecified organism: Secondary | ICD-10-CM

## 2018-10-17 MED ORDER — ZOLPIDEM TARTRATE 5 MG PO TABS
5.0000 mg | ORAL_TABLET | Freq: Every evening | ORAL | Status: DC | PRN
  Administered 2018-10-17: 5 mg via ORAL
  Filled 2018-10-17: qty 1

## 2018-10-17 NOTE — Progress Notes (Signed)
Pt refuses to wear atc at this time. RN aware. Pt in no distress. RA sats 90

## 2018-10-17 NOTE — Progress Notes (Signed)
Patient ID: Kenneth [name], male   DOB: 1940/12/07, 78 y.o.   MRN: [id number]         [facility]  Date of Admission:  10/13/2018                   Day 5 meropenem ASSESSMENT: I do not have a good explanation for his recurrent pneumonia over the past 6 weeks.  He improved promptly this time with broad empiric antibiotic therapy.  He indicates that he feels like he is back to his normal baseline and would be comfortable discharging back to home tomorrow.  PLAN: 1. Continue meropenem 1 more day then consider discharge home tomorrow 2. He will need to be discharged home with his public health forms for a "person under investigation" for Covid-19 3. Please call if I can be of further assistance while he is here  Principal Problem:   HCAP (healthcare-associated pneumonia) Active Problems:   Essential hypertension   Tracheostomy dependent (HCC)   Scheduled Meds: . amLODipine  10 mg Oral Daily  . aspirin  325 mg Oral Daily  . clopidogrel  75 mg Oral Daily  . enoxaparin (LOVENOX) injection  40 mg Subcutaneous Q24H  . ezetimibe  10 mg Oral Daily  . metoprolol tartrate  25 mg Oral BID  . pantoprazole  40 mg Oral Daily  . PARoxetine  20 mg Oral QHS   Continuous Infusions: . meropenem (MERREM) IV 1 g (10/17/18 0910)   PRN Meds:.acetaminophen **OR** acetaminophen, guaiFENesin, ondansetron **OR** ondansetron (ZOFRAN) IV, polyethylene glycol   SUBJECTIVE: He says that he is feeling better.  He indicates that he feels like he is back to his baseline.  He tells me that he would be comfortable going back home tomorrow.  He is having only minimal cough.  Review of Systems: Review of Systems  Constitutional: Negative for chills, diaphoresis and fever.  Respiratory: Positive for cough. Negative for sputum production and shortness of breath.     Allergies  Allergen Reactions  . Nifedipine Other (See Comments)  . Lorazepam Other (See Comments)    REACTION:  Alters mental status. "Turns into maniac" REACTION: Alters mental status. "Turns into maniac" REACTION: Alters mental status. "Turns into maniac"  . Other     REACTION: Unknown to patient. States that MD states allergy per medical records  . Penicillins Hives    Has patient had a PCN reaction causing immediate rash, facial/tongue/throat swelling, SOB or lightheadedness with hypotension: Yes Has patient had a PCN reaction causing severe rash involving mucus membranes or skin necrosis: No Has patient had a PCN reaction that required hospitalization No Has patient had a PCN reaction occurring within the last 10 years: No If all of the above answers are "NO", then may proceed with Cephalosporin use.  Has patient had a PCN reaction causing immediate rash, facial/tongue/throat swelling, SOB or lightheadedness with hypotension: Yes Has patient had a PCN reaction causing severe rash involving mucus membranes or skin necrosis: No Has patient had a PCN reaction that required hospitalization No Has patient had a PCN reaction occurring within the last 10 years: No If all of the above answers are "NO", then may proceed with Cephalosporin use.  . Sulfacetamide     REACTION: Unknown to patient. States that MD states allergy per medical records  . Sulfonamide Derivatives Other (See Comments)    REACTION: Unknown to patient. States that MD states allergy per medical records  . Erythromycin Rash  . Statins  Itching and Rash  . Vancomycin Itching    OBJECTIVE: Vitals:   10/16/18 2343 10/17/18 0028 10/17/18 0404 10/17/18 0840  BP: 123/66     Pulse: (!) 52 (!) 58 (!) 58 (!) 59  Resp: 18 18 18 16   Temp: 98.2 F (36.8 C)     TempSrc: Oral     SpO2: 97% 96% 94% 94%  Weight:      Height:       Body mass index is 29.53 kg/m.  Physical Exam Constitutional:      Comments: He is resting comfortably in bed.  Cardiovascular:     Rate and Rhythm: Normal rate and regular rhythm.     Heart sounds: No  murmur.  Pulmonary:     Effort: Pulmonary effort is normal.     Breath sounds: Normal breath sounds.  Psychiatric:        Mood and Affect: Mood normal.     Lab Results Lab Results  Component Value Date   WBC 8.2 10/16/2018   HGB 10.8 (L) 10/16/2018   HCT 33.8 (L) 10/16/2018   MCV 88.3 10/16/2018   PLT 353 10/16/2018    Lab Results  Component Value Date   CREATININE 1.49 (H) 10/16/2018   BUN 14 10/14/2018   NA 135 10/14/2018   K 4.5 10/14/2018   CL 103 10/14/2018   CO2 23 10/14/2018    Lab Results  Component Value Date   ALT 25 10/13/2018   AST 24 10/13/2018   ALKPHOS 124 10/13/2018   BILITOT 0.8 10/13/2018     Microbiology: Recent Results (from the past 240 hour(s))  Urine culture     Status: None   Collection Time: 10/13/18 10:23 AM  Result Value Ref Range Status   Specimen Description   Final    URINE, CLEAN CATCH Performed at [facility], [address] [place], [place] [address]    Special Requests   Final    NONE Performed at [facility], [address], [place], [place] [address]    Culture   Final    NO GROWTH Performed at [facility], [place] [address], [place], [place] [address]    Report Status 10/15/2018 FINAL  Final  Respiratory Panel by PCR     Status: None   Collection Time: 10/13/18 10:41 AM  Result Value Ref Range Status   Adenovirus NOT DETECTED NOT DETECTED Final   Coronavirus 229E NOT DETECTED NOT DETECTED Final    Comment: (NOTE) The Coronavirus on the Respiratory Panel, DOES NOT test for the novel  Coronavirus (2019 nCoV)    Coronavirus HKU1 NOT DETECTED NOT DETECTED Final   Coronavirus NL63 NOT DETECTED NOT DETECTED Final   Coronavirus OC43 NOT DETECTED NOT DETECTED Final   Metapneumovirus NOT DETECTED NOT DETECTED Final   Rhinovirus / Enterovirus NOT DETECTED NOT DETECTED Final   Influenza A NOT DETECTED NOT DETECTED Final   Influenza B NOT DETECTED NOT DETECTED Final   Parainfluenza Virus 1 NOT DETECTED NOT DETECTED  Final   Parainfluenza Virus 2 NOT DETECTED NOT DETECTED Final   Parainfluenza Virus 3 NOT DETECTED NOT DETECTED Final   Parainfluenza Virus 4 NOT DETECTED NOT DETECTED Final   Respiratory Syncytial Virus NOT DETECTED NOT DETECTED Final   Bordetella pertussis NOT DETECTED NOT DETECTED Final   Chlamydophila pneumoniae NOT DETECTED NOT DETECTED Final   Mycoplasma pneumoniae NOT DETECTED NOT DETECTED Final    Comment: Performed at [facility], [place],  [place] [address]  Culture, blood (routine x 2)     Status: None (Preliminary result)   Collection Time: 10/13/18 11:23 AM  Result Value Ref Range Status   Specimen Description   Final    RIGHT ANTECUBITAL BOTTLES DRAWN AEROBIC AND ANAEROBIC   Special Requests Blood Culture adequate volume  Final   Culture   Final    NO GROWTH 4 DAYS Performed at [facility], [address] [place], [place] [address]    Report Status PENDING  Incomplete  Culture, blood (routine x 2)     Status: None (Preliminary result)   Collection Time: 10/13/18 11:37 AM  Result Value Ref Range Status   Specimen Description   Final    BLOOD RIGHT WRIST BOTTLES DRAWN AEROBIC AND ANAEROBIC   Special Requests Blood Culture adequate volume  Final   Culture   Final    NO GROWTH 4 DAYS Performed at [facility], [address], [place], [place] [address]    Report Status PENDING  Incomplete  Expectorated sputum assessment w rflx to resp cult     Status: None   Collection Time: 10/13/18  3:58 PM  Result Value Ref Range Status   Specimen Description   Final    SPUTUM Performed at [facility], [place] [address] [place], [place] [address]    Special Requests   Final    NONE Performed at [facility], [address] [place], [place] [address]    Sputum evaluation   Final    THIS SPECIMEN IS ACCEPTABLE FOR SPUTUM CULTURE Performed at [facility], [place] [address] [place], [place] [address]    Report Status 10/14/2018 FINAL  Final   Culture, respiratory     Status: None (Preliminary result)   Collection Time: 10/13/18  3:58 PM  Result Value Ref Range Status   Specimen Description SPUTUM  Final   Special Requests NONE Reflexed from [id number]  Final   Gram Stain   Final    ABUNDANT WBC PRESENT, PREDOMINANTLY PMN RARE YEAST    Culture   Final    CULTURE REINCUBATED FOR BETTER GROWTH Performed at [facility], [place] [address], [place], [place] [address]    Report Status PENDING  Incomplete  MRSA PCR Screening     Status: None   Collection Time: 10/14/18 11:39 AM  Result Value Ref Range Status   MRSA by PCR NEGATIVE NEGATIVE Final    Comment:        The GeneXpert MRSA Assay (FDA approved for NASAL specimens only), is one component of a comprehensive MRSA colonization surveillance program. It is not intended to diagnose MRSA infection nor to guide or monitor treatment for MRSA infections. Performed at [facility], [place] [address] [place], [place] [address]     Michel Bickers, [place] [facility] [phone] pager   [phone] cell 10/17/2018, 10:02 AM

## 2018-10-17 NOTE — Progress Notes (Signed)
PROGRESS NOTE    Kenneth [name]   VPX:[id number]  DOB: [birth year]  DOA: 10/13/2018 PCP: [name], MD   Brief Narrative:  Kenneth [name] is a 78 y.o. male with medical history significant for tracheostomy for laryngeal malignancy,HTN,carotid artery stenosis,CVA who presented to th ED with increasing difficulty breathing since discharge after recent hospitalization, with fever.   2/11-2/13- CAP-  Levaquin 3/2-3/6 - HCAP- Meropenem, Linezolid> dischaged on Keflex and Doxycycline  Now presents with fever 102.1, WBC 21, Lactic acidosis, tachypnea.   CTA chest > No improvement in abnormal bilateral lung opacities since the CT on 09/25/2018, and right upper lobe involvement appears progressed.   Started on IV aztreonam and linezolid  Subjective: He is coughing less today and less sputum is coming up. He is not short of breath.     Assessment & Plan:   Principal Problem:   HCAP (healthcare-associated pneumonia)  - started on Merrem, Zyvox and given 1 dose of Azactam on 3/20 - MRSA PCR neg- d/c Zyvox- ID recommend to continue Merrem for 2 more days- tomorrow will be the last day and then he will be discharged - neg blood cultures - also being ruled out for COVID 19- will need to sign COVID quarantine contract if his test has not returned.   Active Problems:   Essential hypertension- AKI -cont Amlodipine and Metoprolol - -  holding Aldactone due to rise in Cr yesterday- - repeat Bmet pending    Tracheostomy dependent   - s/p laryngeal cancer  H/o carotid stenting h/o CVA  - cont anticoaulation and Zetia  Time spent in minutes: 35 DVT prophylaxis: Lovenox Code Status: FUll code Family Communication: wife Disposition Plan:  Complete the course of Meropenem  Consultants:   ID Procedures:   none Antimicrobials:  Anti-infectives (From admission, onward)   Start     Dose/Rate Route Frequency Ordered Stop   10/13/18 2000  aztreonam (AZACTAM) 2 g in sodium chloride 0.9  % 100 mL IVPB  Status:  Discontinued     2 g 200 mL/hr over 30 Minutes Intravenous Every 8 hours 10/13/18 1325 10/13/18 1812   10/13/18 2000  meropenem (MERREM) 1 g in sodium chloride 0.9 % 100 mL IVPB     1 g 200 mL/hr over 30 Minutes Intravenous Every 12 hours 10/13/18 1817 10/18/18 0759   10/13/18 1200  linezolid (ZYVOX) IVPB 600 mg  Status:  Discontinued     600 mg 300 mL/hr over 60 Minutes Intravenous Every 12 hours 10/13/18 1125 10/16/18 1012   10/13/18 1100  aztreonam (AZACTAM) 2 g in sodium chloride 0.9 % 100 mL IVPB     2 g 200 mL/hr over 30 Minutes Intravenous  Once 10/13/18 1050 10/13/18 1248   10/13/18 1100  metroNIDAZOLE (FLAGYL) IVPB 500 mg  Status:  Discontinued     500 mg 100 mL/hr over 60 Minutes Intravenous  Once 10/13/18 1050 10/13/18 1817       Objective: Vitals:   10/16/18 2343 10/17/18 0028 10/17/18 0404 10/17/18 0840  BP: 123/66     Pulse: (!) 52 (!) 58 (!) 58 (!) 59  Resp: 18 18 18 16   Temp: 98.2 F (36.8 C)     TempSrc: Oral     SpO2: 97% 96% 94% 94%  Weight:      Height:        Intake/Output Summary (Last 24 hours) at 10/17/2018 1154 Last data filed at 10/16/2018 1509 Gross per 24 hour  Intake -  Output 550  ml  Net -550 ml   Filed Weights   10/13/18 1248 10/13/18 2215 10/13/18 2300  Weight: 78.9 kg 80.5 kg 80.5 kg    Examination: General exam: Appears comfortable  HEENT: PERRLA, oral mucosa moist, no sclera icterus or thrush- tracheostomy present - on room air- able to plug trac with finger to talk Respiratory system: Clear to auscultation. Respiratory effort normal. Cardiovascular system: S1 & S2 heard,  No murmurs  Gastrointestinal system: Abdomen soft, non-tender, nondistended. Normal bowel sounds   Central nervous system: Alert and oriented. No focal neurological deficits. Extremities: No cyanosis, clubbing or edema Skin: No rashes or ulcers Psychiatry:  Mood & affect appropriate.     Data Reviewed: I have personally reviewed  following labs and imaging studies  CBC: Recent Labs  Lab 10/13/18 1037 10/14/18 0229 10/16/18 0403  WBC 21.1* 16.8* 8.2  NEUTROABS 19.3*  --   --   HGB 12.4* 10.8* 10.8*  HCT 39.0 34.5* 33.8*  MCV 87.8 87.6 88.3  PLT 479* 362 161   Basic Metabolic Panel: Recent Labs  Lab 10/13/18 1037 10/14/18 0229 10/16/18 0403  NA 133* 135  --   K 4.8 4.5  --   CL 96* 103  --   CO2 24 23  --   GLUCOSE 151* 115*  --   BUN 18 14  --   CREATININE 1.45* 1.19 1.49*  CALCIUM 9.6 9.0  --    GFR: Estimated Creatinine Clearance: 39.3 mL/min (A) (by C-G formula based on SCr of 1.49 mg/dL (H)). Liver Function Tests: Recent Labs  Lab 10/13/18 1130  AST 24  ALT 25  ALKPHOS 124  BILITOT 0.8  PROT 8.0  ALBUMIN 3.3*   No results for input(s): LIPASE, AMYLASE in the last 168 hours. No results for input(s): AMMONIA in the last 168 hours. Coagulation Profile: Recent Labs  Lab 10/13/18 1037  INR 1.1   Cardiac Enzymes: Recent Labs  Lab 10/13/18 1037  TROPONINI <0.03   BNP (last 3 results) No results for input(s): PROBNP in the last 8760 hours. HbA1C: No results for input(s): HGBA1C in the last 72 hours. CBG: No results for input(s): GLUCAP in the last 168 hours. Lipid Profile: No results for input(s): CHOL, HDL, LDLCALC, TRIG, CHOLHDL, LDLDIRECT in the last 72 hours. Thyroid Function Tests: No results for input(s): TSH, T4TOTAL, FREET4, T3FREE, THYROIDAB in the last 72 hours. Anemia Panel: No results for input(s): VITAMINB12, FOLATE, FERRITIN, TIBC, IRON, RETICCTPCT in the last 72 hours. Urine analysis:    Component Value Date/Time   COLORURINE YELLOW 10/13/2018 1023   APPEARANCEUR CLEAR 10/13/2018 1023   LABSPEC 1.010 10/13/2018 1023   PHURINE 8.0 10/13/2018 1023   GLUCOSEU NEGATIVE 10/13/2018 1023   GLUCOSEU neg 06/01/2010 1113   HGBUR NEGATIVE 10/13/2018 [place] 10/13/2018 [place] 10/13/2018 1023   PROTEINUR NEGATIVE 10/13/2018  1023   NITRITE NEGATIVE 10/13/2018 1023   LEUKOCYTESUR NEGATIVE 10/13/2018 1023   Sepsis Labs: @LABRCNTIP (procalcitonin:4,lacticidven:4) ) Recent Results (from the past 240 hour(s))  Urine culture     Status: None   Collection Time: 10/13/18 10:23 AM  Result Value Ref Range Status   Specimen Description   Final    URINE, CLEAN CATCH Performed at [facility], [address], [place], [place] [address]    Special Requests   Final    NONE Performed at [facility], [address] [place], [place] [address]    Culture   Final  NO GROWTH Performed at [facility], [place] [address], [place], [place] [address]    Report Status 10/15/2018 FINAL  Final  Respiratory Panel by PCR     Status: None   Collection Time: 10/13/18 10:41 AM  Result Value Ref Range Status   Adenovirus NOT DETECTED NOT DETECTED Final   Coronavirus 229E NOT DETECTED NOT DETECTED Final    Comment: (NOTE) The Coronavirus on the Respiratory Panel, DOES NOT test for the novel  Coronavirus (2019 nCoV)    Coronavirus HKU1 NOT DETECTED NOT DETECTED Final   Coronavirus NL63 NOT DETECTED NOT DETECTED Final   Coronavirus OC43 NOT DETECTED NOT DETECTED Final   Metapneumovirus NOT DETECTED NOT DETECTED Final   Rhinovirus / Enterovirus NOT DETECTED NOT DETECTED Final   Influenza A NOT DETECTED NOT DETECTED Final   Influenza B NOT DETECTED NOT DETECTED Final   Parainfluenza Virus 1 NOT DETECTED NOT DETECTED Final   Parainfluenza Virus 2 NOT DETECTED NOT DETECTED Final   Parainfluenza Virus 3 NOT DETECTED NOT DETECTED Final   Parainfluenza Virus 4 NOT DETECTED NOT DETECTED Final   Respiratory Syncytial Virus NOT DETECTED NOT DETECTED Final   Bordetella pertussis NOT DETECTED NOT DETECTED Final   Chlamydophila pneumoniae NOT DETECTED NOT DETECTED Final   Mycoplasma pneumoniae NOT DETECTED NOT DETECTED Final    Comment: Performed at [facility], [place] [address], [place], [place] [address]  Culture, blood  (routine x 2)     Status: None (Preliminary result)   Collection Time: 10/13/18 11:23 AM  Result Value Ref Range Status   Specimen Description   Final    RIGHT ANTECUBITAL BOTTLES DRAWN AEROBIC AND ANAEROBIC   Special Requests Blood Culture adequate volume  Final   Culture   Final    NO GROWTH 4 DAYS Performed at [facility], [address] [place], [place] [address]    Report Status PENDING  Incomplete  Culture, blood (routine x 2)     Status: None (Preliminary result)   Collection Time: 10/13/18 11:37 AM  Result Value Ref Range Status   Specimen Description   Final    BLOOD RIGHT WRIST BOTTLES DRAWN AEROBIC AND ANAEROBIC   Special Requests Blood Culture adequate volume  Final   Culture   Final    NO GROWTH 4 DAYS Performed at [facility], [address] [place], [place] [address]    Report Status PENDING  Incomplete  Expectorated sputum assessment w rflx to resp cult     Status: None   Collection Time: 10/13/18  3:58 PM  Result Value Ref Range Status   Specimen Description   Final    SPUTUM Performed at [facility], [place] [address], [place], [place] [address]    Special Requests   Final    NONE Performed at [facility], [address], [place], [place] [address]    Sputum evaluation   Final    THIS SPECIMEN IS ACCEPTABLE FOR SPUTUM CULTURE Performed at [facility], [place] [address] [place],  [address]    Report Status 10/14/2018 FINAL  Final  Culture, respiratory     Status: None (Preliminary result)   Collection Time: 10/13/18  3:58 PM  Result Value Ref Range Status   Specimen Description SPUTUM  Final   Special Requests NONE Reflexed from [id number]  Final   Gram Stain   Final    ABUNDANT WBC PRESENT, PREDOMINANTLY PMN RARE YEAST    Culture   Final  CULTURE REINCUBATED FOR BETTER GROWTH Performed at [facility], [place] [address] [place], [place] [address]    Report Status PENDING  Incomplete  MRSA PCR Screening     Status: None    Collection Time: 10/14/18 11:39 AM  Result Value Ref Range Status   MRSA by PCR NEGATIVE NEGATIVE Final    Comment:        The GeneXpert MRSA Assay (FDA approved for NASAL specimens only), is one component of a comprehensive MRSA colonization surveillance program. It is not intended to diagnose MRSA infection nor to guide or monitor treatment for MRSA infections. Performed at [facility], [place] [address] [place], [place] [address]          Radiology Studies: No results found.    Scheduled Meds: . amLODipine  10 mg Oral Daily  . aspirin  325 mg Oral Daily  . clopidogrel  75 mg Oral Daily  . enoxaparin (LOVENOX) injection  40 mg Subcutaneous Q24H  . ezetimibe  10 mg Oral Daily  . metoprolol tartrate  25 mg Oral BID  . pantoprazole  40 mg Oral Daily  . PARoxetine  20 mg Oral QHS   Continuous Infusions: . meropenem (MERREM) IV 1 g (10/17/18 0910)     LOS: 4 days      Debbe Odea, MD [facility] Pager: [email or web address] Password [facility] 10/17/2018, 11:54 AM

## 2018-10-17 NOTE — Progress Notes (Signed)
Trach care done by RT. No breakdown noted. Trach secure. Pt tol well

## 2018-10-18 DIAGNOSIS — I10 Essential (primary) hypertension: Secondary | ICD-10-CM

## 2018-10-18 LAB — BASIC METABOLIC PANEL
Anion gap: 9 (ref 5–15)
BUN: 14 mg/dL (ref 8–23)
CO2: 27 mmol/L (ref 22–32)
Calcium: 9.1 mg/dL (ref 8.9–10.3)
Chloride: 100 mmol/L (ref 98–111)
Creatinine, Ser: 1.5 mg/dL — ABNORMAL HIGH (ref 0.61–1.24)
GFR calc Af Amer: 51 mL/min — ABNORMAL LOW (ref >60)
GFR calc non Af Amer: 44 mL/min — ABNORMAL LOW (ref >60)
Glucose, Bld: 89 mg/dL (ref 70–99)
Potassium: 4.1 mmol/L (ref 3.5–5.1)
Sodium: 136 mmol/L (ref 135–145)

## 2018-10-18 LAB — CULTURE, BLOOD (ROUTINE X 2)
Culture: NO GROWTH
Culture: NO GROWTH
Special Requests: ADEQUATE
Special Requests: ADEQUATE

## 2018-10-18 LAB — CULTURE, RESPIRATORY W GRAM STAIN

## 2018-10-18 LAB — CULTURE, RESPIRATORY

## 2018-10-18 NOTE — Discharge Summary (Addendum)
Physician Discharge Summary  Kenneth [name] OEU:[id number] DOB: [birth year] DOA: 10/13/2018  PCP: [name], MD  Admit date: 10/13/2018 Discharge date: 10/18/2018  Admitted From: Home  Disposition:  Home   Recommendations for Outpatient Follow-up:  1. Follow up with PCP within 1 week 2. Please obtain BMP/CBC in one week    Home Health: None  Equipment/Devices: None  Discharge Condition: Fair  CODE STATUS: FULL Diet recommendation: Cardiac  Brief/Interim Summary: Kenneth [name] is a 78 y.o. M with hx laryngeal cancer, now with tracheostomy, HTN, PVD and hx CVA who presented with recurrent fever, dyspnea.  Recently admitted 2/11 to 2/13 with CAP, treated with Levaquin.  Admitted again 3/2 to 3/6, treated with Meropenem and Linezolid, discharged on Keflex and doxycycline.    Now with recurrent dyspnea and leukocytosis.  CTA showed progression of right upper lobe pneumonia.  Started on aztreonam and linezolid and admitted.  CoV sent.  ID consulted.         PRINCIPAL HOSPITAL DIAGNOSIS: Sepsis from recurrent pneumonia    Discharge Diagnoses:  Sepsis from recurrent pneumonia Treated with Meropenem x5 days.  Fever resolved, WBC normalized.  ID were consulted who agreed with 5 day course.  Sputum grew few Pseudomonas.  COVID testing negative.    He was weaned off O2, and was comfortable on room air. Patient mentating at baseline, taking orals.  Temp < 100 F, heart rate < 100bpm, RR < 24, SpO2 at baseline.   Stable for discharge.    CKD stage III Creatinine at baseline 1.2-1.5, stable.  Hypertension  History of stroke         Discharge Instructions  Discharge Instructions    Discharge instructions   Complete by:  As directed    From Dr. Loleta Books and Dr. Wynelle Cleveland: You were admitted with fever and fast breathing and cough.  On your CT scan of your chest, it appeared that you had another pneumonia.  This was treated with broad spectrum antibiotics.  Because we  are still unsure of the degree of community spread of the novel coronavirus in our community, you were tested and this test was NORMAL (no signs of the corovonavirus).  Our infectious disease specialist is unsure why you have developed this recurrent pneumonia, but we are all pleased by your quick response.  Call your primary care doctor for instructions on how to follow up in the next few days.   Increase activity slowly   Complete by:  As directed      Allergies as of 10/18/2018      Reactions   Nifedipine Other (See Comments)   Lorazepam Other (See Comments)   REACTION: Alters mental status. "Turns into maniac" REACTION: Alters mental status. "Turns into maniac" REACTION: Alters mental status. "Turns into maniac"   Other    REACTION: Unknown to patient. States that MD states allergy per medical records   Penicillins Hives   Has patient had a PCN reaction causing immediate rash, facial/tongue/throat swelling, SOB or lightheadedness with hypotension: Yes Has patient had a PCN reaction causing severe rash involving mucus membranes or skin necrosis: No Has patient had a PCN reaction that required hospitalization No Has patient had a PCN reaction occurring within the last 10 years: No If all of the above answers are "NO", then may proceed with Cephalosporin use. HAS TOLERATED: cephalexin   Sulfacetamide    REACTION: Unknown to patient. States that MD states allergy per medical records   Sulfonamide Derivatives Other (See Comments)   REACTION:  Unknown to patient. States that MD states allergy per medical records   Erythromycin Rash   Statins Itching, Rash   Vancomycin Itching      Medication List    STOP taking these medications   acetaminophen 325 MG tablet Commonly known as:  TYLENOL     TAKE these medications   albuterol (2.5 MG/3ML) 0.083% nebulizer solution Commonly known as:  PROVENTIL Take 3 mLs (2.5 mg total) by nebulization 3 (three) times daily. X 5 days and then q 6  hrs prn after that Notes to patient:  3 times daily for 5 days then use up to 2 times daily   amLODipine 10 MG tablet Commonly known as:  NORVASC Take 1 tablet by mouth once daily   aspirin 325 MG tablet Take 1 tablet (325 mg total) by mouth daily.   clopidogrel 75 MG tablet Commonly known as:  PLAVIX Take 1 tablet (75 mg total) by mouth daily. What changed:  when to take this   ezetimibe 10 MG tablet Commonly known as:  ZETIA Take 1 tablet by mouth once daily   metoprolol tartrate 25 MG tablet Commonly known as:  LOPRESSOR Take 25 mg by mouth 2 (two) times daily.   nitroGLYCERIN 0.4 MG SL tablet Commonly known as:  Nitrostat Place 1 tablet (0.4 mg total) under the tongue every 5 (five) minutes as needed for chest pain.   omeprazole 20 MG capsule Commonly known as:  PRILOSEC Take 20 mg by mouth every morning.   PARoxetine 20 MG tablet Commonly known as:  PAXIL Take 20 mg by mouth at bedtime.   spironolactone 25 MG tablet Commonly known as:  ALDACTONE Take 25 mg by mouth every morning.      Follow-up Information    [name], MD Follow up.   Specialty:  Internal Medicine Why:  please schedule follow up apt Contact information: [address] [place] [address] [phone]          Allergies  Allergen Reactions  . Nifedipine Other (See Comments)  . Lorazepam Other (See Comments)    REACTION: Alters mental status. "Turns into maniac" REACTION: Alters mental status. "Turns into maniac" REACTION: Alters mental status. "Turns into maniac"  . Other     REACTION: Unknown to patient. States that MD states allergy per medical records  . Penicillins Hives    Has patient had a PCN reaction causing immediate rash, facial/tongue/throat swelling, SOB or lightheadedness with hypotension: Yes Has patient had a PCN reaction causing severe rash involving mucus membranes or skin necrosis: No Has patient had a PCN reaction that required hospitalization  No Has patient had a PCN reaction occurring within the last 10 years: No If all of the above answers are "NO", then may proceed with Cephalosporin use.  HAS TOLERATED: cephalexin  . Sulfacetamide     REACTION: Unknown to patient. States that MD states allergy per medical records  . Sulfonamide Derivatives Other (See Comments)    REACTION: Unknown to patient. States that MD states allergy per medical records  . Erythromycin Rash  . Statins Itching and Rash  . Vancomycin Itching    Consultations:  Infectious disease   Procedures/Studies: Dg Chest 2 View  Result Date: 10/09/2018 CLINICAL DATA:  Pneumonia EXAM: CHEST - 2 VIEW COMPARISON:  09/29/2018 FINDINGS: Cardiac shadows within normal limits. Patchy opacities are again identified bilaterally and stable. There are long-term stable from a prior CT examination from 09/25/2018. These likely represent areas of scarring. No new focal  infiltrate is seen. Tracheostomy tube is noted. No sizable effusion is seen. No acute bony abnormality is noted. IMPRESSION: Chronic lung opacities bilaterally likely related to scarring. Electronically Signed   By: Inez Catalina M.D.   On: 10/09/2018 16:17   Dg Chest 2 View  Result Date: 09/29/2018 CLINICAL DATA:  Short of breath EXAM: CHEST - 2 VIEW COMPARISON:  09/25/2018 FINDINGS: Tracheostomy tube is stable. Normal heart size. Postoperative changes in the mediastinum. Low volumes. Bilateral patchy airspace opacities are stable. No pneumothorax. No pleural effusions. IMPRESSION: Stable bilateral patchy airspace opacities. Electronically Signed   By: Marybelle Killings M.D.   On: 09/29/2018 09:42   Dg Chest 2 View  Result Date: 09/25/2018 CLINICAL DATA:  PATIENT DISCHARGED [facility] 3 WEEKS AGO AND TREATED FOR PNEUMONIA. PATIENT CONTINUES TO HAVE COUGH, SOB, AND FEVER SINCE DISCHARGE. 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 EXAM: CHEST - 2  VIEW COMPARISON:  To 06/15/2019 FINDINGS: Tracheostomy in place. Patient has had median sternotomy and CABG. The heart is normal in size. Significant persistent patchy opacities bilaterally, possibly more confluent at the RIGHT lung base. There is no pleural effusion. No pulmonary edema. IMPRESSION: Persistent bilateral pulmonary infiltrates representing a significant change from studies in late 2019. Consider further evaluation CT of the chest. Electronically Signed   By: Nolon Nations M.D.   On: 09/25/2018 14:29   Ct Chest W Contrast  Result Date: 09/25/2018 CLINICAL DATA:  78 year old male with a history of interstitial lung disease currently with shortness of breath on exertion, generalized weakness, low-grade fever and cough for the past 4 days. EXAM: CT CHEST WITH CONTRAST TECHNIQUE: Multidetector CT imaging of the chest was performed during intravenous contrast administration. CONTRAST:  46mL OMNIPAQUE IOHEXOL 300 MG/ML  SOLN COMPARISON:  Chest x-ray obtained earlier today; prior CT scan of the chest 06/08/2017 FINDINGS: Cardiovascular: Incompletely visualized stent in the right common carotid artery. Patient is status post median sternotomy with evidence of prior multivessel CABG. Borderline cardiomegaly. No pericardial effusion. Conventional 3 vessel arch anatomy. No evidence of aneurysm. Mediastinum/Nodes: Tracheostomy tube. The tip of the tube is identified in the mid trachea. No suspicious mediastinal or hilar adenopathy. The esophagus is unremarkable. Lungs/Pleura: Multifocal patchy airspace opacities noted in the bilateral upper lobes slightly worse on the right than the left. Minimal patchy airspace disease also present within the superior segments of both lower lobes. Mild paraseptal pulmonary emphysema. Diffuse mild bronchial wall thickening. Areas of chronic linear scarring visualized in the right middle lobe and lingula. No pleural effusion or pneumothorax. Upper Abdomen: No acute  abnormality. Surgical changes of prior cholecystectomy. Musculoskeletal: No acute fracture or aggressive appearing lytic or blastic osseous lesion. Healed median sternotomy. IMPRESSION: 1. Multifocal patchy ground-glass attenuation airspace opacities throughout both upper lobes and the superior segments of the lower lobes concerning for a multifocal infectious or inflammatory process. Multifocal pneumonia is favored. Differential consideration should include atypical and viral pneumonia. 2. Aortic Atherosclerosis (ICD10-I70.0) and Emphysema (ICD10-J43.9). 3. Well-positioned tracheostomy tube. Electronically Signed   By: Jacqulynn Cadet M.D.   On: 09/25/2018 17:25   Ct Angio Chest Pe W/cm &/or Wo Cm  Result Date: 10/13/2018 CLINICAL DATA:  78 year old male with shortness of breath. EXAM: CT ANGIOGRAPHY CHEST WITH CONTRAST TECHNIQUE: Multidetector CT imaging of the chest was performed using the standard protocol during bolus administration of intravenous contrast. Multiplanar CT image reconstructions and MIPs were obtained to evaluate the  vascular anatomy. CONTRAST:  7mL OMNIPAQUE IOHEXOL 350 MG/ML SOLN COMPARISON:  CT chest 09/25/2018 FINDINGS: Cardiovascular: Good contrast bolus timing in the pulmonary arterial tree. No focal filling defect identified in the pulmonary arteries to suggest acute pulmonary embolism. Prior CABG. Stable mild cardiomegaly. No pericardial effusion. Calcified aortic atherosclerosis. Patent partially visible right CCA vascular stent. Mediastinum/Nodes: Stable small mediastinal lymph nodes and sequelae of CABG. Lungs/Pleura: Stable tracheostomy with no adverse features. Major airways are patent although with mildly increased atelectatic changes. Continued widespread Patchy and confluent bilateral pulmonary ground-glass opacity. Progression in the right upper lobe since 09/25/2018 (series 6, image 28 today). Superimposed increased dependent atelectasis. Superimposed mosaic attenuation  which is more apparent in the right lung and suggests gas trapping. No areas of improvement are identified. Trace right pleural fluid or pleural thickening is stable. Upper Abdomen: Surgically absent gallbladder. Stable and negative visible liver, spleen, pancreas, adrenal glands, kidneys, and bowel. Musculoskeletal: No acute osseous abnormality identified. Flowing endplate osteophytes in the thoracic spine with some ankylosis. Prior sternotomy. Other findings: There is a degree of gynecomastia. Review of the MIP images confirms the above findings. IMPRESSION: 1. No evidence of acute pulmonary embolus. 2. No improvement in abnormal bilateral lung opacity since the CT on 09/25/2018, and right upper lobe involvement appears progressed. Underlying gas trapping and atelectasis. Favor acute viral/atypical respiratory infection superimposed on chronic lung disease. Noninfectious progression of chronic lung disease felt less likely. 3. 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 4. Aortic atherosclerosis status post CABG. Electronically Signed   By: Genevie Ann M.D.   On: 10/13/2018 15:13   Dg Chest Port 1 View  Result Date: 10/13/2018 CLINICAL DATA:  Chest pain and shortness of breath EXAM: PORTABLE CHEST 1 VIEW COMPARISON:  October 09, 2018 FINDINGS: Tracheostomy catheter tip is 5.6 cm above the carina. There is scarring in each mid lung region and right base. There is no appreciable edema or consolidation. Heart size and pulmonary vascularity are normal. Patient is status post internal mammary bypass grafting. No adenopathy. No bone lesions. A stent is noted in the right carotid region. IMPRESSION: Tracheostomy as described. No pneumothorax. Areas of scarring bilaterally, more on the right than on the left. No edema or consolidation. Stable cardiac silhouette. Right carotid stent noted. Electronically Signed   By: Lowella Grip III M.D.   On: 10/13/2018 10:40   Dg Swallowing Func-speech Pathology  Result Date:  09/27/2018 Objective Swallowing Evaluation: Type of Study: MBS-Modified Barium Swallow Study  Patient Details Name: ATHEL MERRIWEATHER MRN: [id number] Date of Birth: [birth year] Today's Date: 09/27/2018 Time: SLP Start Time (ACUTE ONLY): 1300 -SLP Stop Time (ACUTE ONLY): 1330 SLP Time Calculation (min) (ACUTE ONLY): 30 min Past Medical History: Past Medical History: Diagnosis Date . Anxiety  . Arthritis   Back (05/04/2018) . ASCVD (arteriosclerotic cardiovascular disease)  . Chronic lower back pain   "have it at night" (05/04/2018) . Coronary artery disease   a. 2003: s/p CABG x3V  b. 2007: cath with patent bypass grafts.  c. 09/2016: [place] with cath showing patent bypass grafts, medical Rx recommended . CVA (cerebral vascular accident) ([place]) 10/2017  "little numb on my left face since" (05/04/2018) . Depression  . GERD (gastroesophageal reflux disease)  . High triglycerides  . History of kidney stones  . Hypertension  . Laryngeal carcinoma ([place]) 1998 . Malodorous urine   "in the last 5wks; since I had trach put in" (05/04/2018) . Peripheral vascular disease ([place])  . Stroke [facility])   "he's  had several little strokes; many that he wasn't aware of" (05/04/2018) . Tobacco abuse  . Tracheal stenosis  Past Surgical History: Past Surgical History: Procedure Laterality Date . CAROTID STENT Right 06-13-12; 05/04/2018 . CAROTID STENT INSERTION N/A 06/13/2012  Procedure: CAROTID STENT INSERTION;  Surgeon: Serafina Mitchell, MD;  Location: [facility] CATH LAB;  Service: Cardiovascular;  Laterality: N/A; . CATARACT EXTRACTION, BILATERAL Bilateral  . COLONOSCOPY  11/10/2011  Dr. [name]: hemorrhoids, tubular adenoma . CORNEAL TRANSPLANT Bilateral  . CORONARY ARTERY BYPASS GRAFT  ~ 2003  "CABG X3" . EYE SURGERY   . INSERTION OF RETROGRADE CAROTID STENT Right 05/04/2018  Procedure: INSERTION OF RIGHT  CAROTID STENT using an ABBOT- XACT carotid stent system;  Surgeon: Serafina Mitchell, MD;  Location: [place];  Service: Vascular;  Laterality: Right; .  LAPAROSCOPIC CHOLECYSTECTOMY  2007 . LEFT HEART CATH AND CORS/GRAFTS ANGIOGRAPHY N/A 10/07/2016  Procedure: Left Heart Cath and Cors/Grafts Angiography;  Surgeon: Troy Sine, MD;  Location: [place] CV LAB;  Service: Cardiovascular;  Laterality: N/A; . PARTIAL LARYNGECTOMY  1998 . TRACHEOSTOMY  03/13/2018  "@ [facility]" HPI: JIMMEY HENGEL a 77 y.o.malewith medical history significant fortracheostomy for laryngeal malignancy,HTN,carotid artery stenosis,CVAwho presented to the ED with complaints of difficulty breathing, with cough and fevers over the past 2 days. T-max of 100.1 recorded. Recent hospitalization2/11- 2/13-also for pneumonia,with negative influenza and respiratory viral panel. Patient was started on Levaquin in hospital and discharged home on same. He reports compliance with the medication. Son reports some fevers initially on discharge-for about 2 days, but this resolved. Patient felt clinically better until 2 days ago. 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 He had a FEES at [facility] in 2017 with recommendation for MBSS, however Pt declined at that time. BSE requested.  Subjective: "I don't have any trouble swallowing." Assessment / Plan / Recommendation CHL IP CLINICAL IMPRESSIONS 09/27/2018 Clinical Impression Pt presents with essentially WNL oropharyngeal swallow in setting of partial laryngectomy and trach in place. Pt with swallow trigger at the level of the valleculae across textures and consistencies with variable flash penetration (underepiglottic coating only) of thins and no aspiration observed. Pt with min vallecular residuals with puree, however Pt cleared with independent repeat swallow. Esophageal sweep was remarkable for delayed emptying in distal esophagus and some retrograde movement noted from LES back into distal esophagus, suspicious for reflux. After results were reviewed with Pt, he tells me  that he notices liquids coming back up when he bends over after drinking placing him at risk for post prandial aspiration. Recommend regular textures and thin liquids with reflux precautions- Pt will need benefit of gravity and should sit upright for at least 30 minutes after meals and avoid bending over. No further SLP services indicated at this time.  SLP Visit Diagnosis Dysphagia, unspecified (R13.10) Attention and concentration deficit following -- Frontal lobe and executive function deficit following -- Impact on safety and function Mild aspiration risk   CHL IP TREATMENT RECOMMENDATION 09/27/2018 Treatment Recommendations No treatment recommended at this time   Prognosis 09/27/2018 Prognosis for Safe Diet Advancement Good Barriers to Reach Goals -- Barriers/Prognosis Comment -- CHL IP DIET RECOMMENDATION 09/27/2018 SLP Diet Recommendations Regular solids;Thin liquid Liquid Administration via Cup;Straw Medication Administration Whole meds with liquid Compensations -- Postural Changes Remain semi-upright after after feeds/meals (Comment);Seated upright at 90 degrees   CHL IP OTHER RECOMMENDATIONS 09/27/2018 Recommended Consults -- Oral Care Recommendations Oral care BID;Patient independent  with oral care Other Recommendations Clarify dietary restrictions   CHL IP FOLLOW UP RECOMMENDATIONS 09/27/2018 Follow up Recommendations None   CHL IP FREQUENCY AND DURATION 09/26/2018 Speech Therapy Frequency (ACUTE ONLY) min 2x/week Treatment Duration 1 week      CHL IP ORAL PHASE 09/27/2018 Oral Phase WFL Oral - Pudding Teaspoon -- Oral - Pudding Cup -- Oral - Honey Teaspoon -- Oral - Honey Cup -- Oral - Nectar Teaspoon -- Oral - Nectar Cup -- Oral - Nectar Straw -- Oral - Thin Teaspoon -- Oral - Thin Cup -- Oral - Thin Straw -- Oral - Puree -- Oral - Mech Soft -- Oral - Regular -- Oral - Multi-Consistency -- Oral - Pill -- Oral Phase - Comment --  CHL IP PHARYNGEAL PHASE 09/27/2018 Pharyngeal Phase Impaired Pharyngeal- Pudding Teaspoon  -- Pharyngeal -- Pharyngeal- Pudding Cup -- Pharyngeal -- Pharyngeal- Honey Teaspoon -- Pharyngeal -- Pharyngeal- Honey Cup -- Pharyngeal -- Pharyngeal- Nectar Teaspoon -- Pharyngeal -- Pharyngeal- Nectar Cup -- Pharyngeal -- Pharyngeal- Nectar Straw -- Pharyngeal -- Pharyngeal- Thin Teaspoon Delayed swallow initiation-vallecula;Penetration/Aspiration during swallow Pharyngeal Material does not enter airway;Material enters airway, remains ABOVE vocal cords then ejected out Pharyngeal- Thin Cup Delayed swallow initiation-vallecula;WFL Pharyngeal -- Pharyngeal- Thin Straw WFL Pharyngeal -- Pharyngeal- Puree Delayed swallow initiation-vallecula;Pharyngeal residue - valleculae Pharyngeal -- Pharyngeal- Mechanical Soft -- Pharyngeal -- Pharyngeal- Regular Delayed swallow initiation-vallecula;Pharyngeal residue - cp segment Pharyngeal -- Pharyngeal- Multi-consistency -- Pharyngeal -- Pharyngeal- Pill Delayed swallow initiation-vallecula Pharyngeal -- Pharyngeal Comment --  CHL IP CERVICAL ESOPHAGEAL PHASE 09/27/2018 Cervical Esophageal Phase Impaired Pudding Teaspoon -- Pudding Cup -- Honey Teaspoon -- Honey Cup -- Nectar Teaspoon -- Nectar Cup -- Nectar Straw -- Thin Teaspoon -- Thin Cup -- Thin Straw -- Puree -- Mechanical Soft -- Regular -- Multi-consistency -- Pill -- Cervical Esophageal Comment -- Thank you, Genene Churn, [place] PORTER,DABNEY 09/27/2018, 3:51 PM                  Subjective: Feeling well.  Appetite good, no confusion, dyspnea or chest pain.  Discharge Exam: Vitals:   10/18/18 0744 10/18/18 0809  BP:  132/69  Pulse: (!) 53 (!) 55  Resp: 18   Temp:  98.2 F (36.8 C)  SpO2: 94% 97%   Vitals:   10/18/18 0023 10/18/18 0406 10/18/18 0744 10/18/18 0809  BP:    132/69  Pulse: (!) 58 (!) 52 (!) 53 (!) 55  Resp: 16 16 18    Temp:    98.2 F (36.8 C)  TempSrc:    Oral  SpO2: 95% 92% 94% 97%  Weight:      Height:        General: Pt is alert, awake, not in acute distress,  watching TV Cardiovascular: RRR, nl S1-S2, no murmurs appreciated.   No LE edema.   Respiratory: Trach clear without debris or discharge; Normal respiratory rate and rhythm.  CTAB without rales or wheezes. Abdominal: Abdomen soft and non-tender.  No distension or HSM.   Neuro/Psych: Strength symmetric in upper and lower extremities.  Judgment and insight appear normal.   The results of significant diagnostics from this hospitalization (including imaging, microbiology, ancillary and laboratory) are listed below for reference.     Microbiology: Recent Results (from the past 240 hour(s))  Urine culture     Status: None   Collection Time: 10/13/18 10:23 AM  Result Value Ref Range Status   Specimen Description   Final    URINE, CLEAN CATCH Performed at  [facility], [address], [place], [place] [address]    Special Requests   Final    NONE Performed at [facility], [address] [place], [place] [address]    Culture   Final    NO GROWTH Performed at [facility], [place] [address] [place], [place] [address]    Report Status 10/15/2018 FINAL  Final  Respiratory Panel by PCR     Status: None   Collection Time: 10/13/18 10:41 AM  Result Value Ref Range Status   Adenovirus NOT DETECTED NOT DETECTED Final   Coronavirus 229E NOT DETECTED NOT DETECTED Final    Comment: (NOTE) The Coronavirus on the Respiratory Panel, DOES NOT test for the novel  Coronavirus (2019 nCoV)    Coronavirus HKU1 NOT DETECTED NOT DETECTED Final   Coronavirus NL63 NOT DETECTED NOT DETECTED Final   Coronavirus OC43 NOT DETECTED NOT DETECTED Final   Metapneumovirus NOT DETECTED NOT DETECTED Final   Rhinovirus / Enterovirus NOT DETECTED NOT DETECTED Final   Influenza A NOT DETECTED NOT DETECTED Final   Influenza B NOT DETECTED NOT DETECTED Final   Parainfluenza Virus 1 NOT DETECTED NOT DETECTED Final   Parainfluenza Virus 2 NOT DETECTED NOT DETECTED Final   Parainfluenza Virus 3 NOT DETECTED NOT  DETECTED Final   Parainfluenza Virus 4 NOT DETECTED NOT DETECTED Final   Respiratory Syncytial Virus NOT DETECTED NOT DETECTED Final   Bordetella pertussis NOT DETECTED NOT DETECTED Final   Chlamydophila pneumoniae NOT DETECTED NOT DETECTED Final   Mycoplasma pneumoniae NOT DETECTED NOT DETECTED Final    Comment: Performed at [facility], [place] [address], [place], [place] [address]  Culture, blood (routine x 2)     Status: None   Collection Time: 10/13/18 11:23 AM  Result Value Ref Range Status   Specimen Description   Final    RIGHT ANTECUBITAL BOTTLES DRAWN AEROBIC AND ANAEROBIC   Special Requests Blood Culture adequate volume  Final   Culture   Final    NO GROWTH 5 DAYS Performed at [facility], [address], [place], [place] [address]    Report Status 10/18/2018 FINAL  Final  Culture, blood (routine x 2)     Status: None   Collection Time: 10/13/18 11:37 AM  Result Value Ref Range Status   Specimen Description   Final    BLOOD RIGHT WRIST BOTTLES DRAWN AEROBIC AND ANAEROBIC   Special Requests Blood Culture adequate volume  Final   Culture   Final    NO GROWTH 5 DAYS Performed at [facility], [address], [place], [place] [address]    Report Status 10/18/2018 FINAL  Final  Expectorated sputum assessment w rflx to resp cult     Status: None   Collection Time: 10/13/18  3:58 PM  Result Value Ref Range Status   Specimen Description   Final    SPUTUM Performed at [facility], [place] [address], [place],  [address]    Special Requests   Final    NONE Performed at [facility], [address] [place],  [address]    Sputum evaluation   Final    THIS SPECIMEN IS ACCEPTABLE FOR SPUTUM CULTURE Performed at [facility], [place] [address], [place],  [address]    Report Status 10/14/2018 FINAL  Final  Culture, respiratory     Status: None   Collection Time: 10/13/18  3:58 PM  Result Value Ref Range Status   Specimen  Description SPUTUM   Final   Special Requests NONE Reflexed from [id number]  Final   Gram Stain   Final    ABUNDANT WBC PRESENT, PREDOMINANTLY PMN RARE YEAST Performed at [facility], [place] [address], [place], [place] [address]    Culture   Final    FEW CANDIDA ALBICANS RARE PSEUDOMONAS AERUGINOSA    Report Status 10/18/2018 FINAL  Final   Organism ID, Bacteria PSEUDOMONAS AERUGINOSA  Final      Susceptibility   Pseudomonas aeruginosa - MIC*    CEFTAZIDIME 4 SENSITIVE Sensitive     CIPROFLOXACIN <=0.25 SENSITIVE Sensitive     GENTAMICIN <=1 SENSITIVE Sensitive     IMIPENEM 1 SENSITIVE Sensitive     PIP/TAZO 8 SENSITIVE Sensitive     CEFEPIME 2 SENSITIVE Sensitive     * RARE PSEUDOMONAS AERUGINOSA  MRSA PCR Screening     Status: None   Collection Time: 10/14/18 11:39 AM  Result Value Ref Range Status   MRSA by PCR NEGATIVE NEGATIVE Final    Comment:        The GeneXpert MRSA Assay (FDA approved for NASAL specimens only), is one component of a comprehensive MRSA colonization surveillance program. It is not intended to diagnose MRSA infection nor to guide or monitor treatment for MRSA infections. Performed at [facility], [place] [address] [place], [place] [address]      Labs: BNP (last 3 results) Recent Labs    06/08/18 1410 06/10/18 0539 10/13/18 1037  BNP 93.0 205.8* 73.4   Basic Metabolic Panel: Recent Labs  Lab 10/13/18 1037 10/14/18 0229 10/16/18 0403 10/18/18 0624  NA 133* 135  --  136  K 4.8 4.5  --  4.1  CL 96* 103  --  100  CO2 24 23  --  27  GLUCOSE 151* 115*  --  89  BUN 18 14  --  14  CREATININE 1.45* 1.19 1.49* 1.50*  CALCIUM 9.6 9.0  --  9.1   Liver Function Tests: Recent Labs  Lab 10/13/18 1130  AST 24  ALT 25  ALKPHOS 124  BILITOT 0.8  PROT 8.0  ALBUMIN 3.3*   No results for input(s): LIPASE, AMYLASE in the last 168 hours. No results for input(s): AMMONIA in the last 168 hours. CBC: Recent Labs  Lab 10/13/18 1037 10/14/18 0229  10/16/18 0403  WBC 21.1* 16.8* 8.2  NEUTROABS 19.3*  --   --   HGB 12.4* 10.8* 10.8*  HCT 39.0 34.5* 33.8*  MCV 87.8 87.6 88.3  PLT 479* 362 353   Cardiac Enzymes: Recent Labs  Lab 10/13/18 1037  TROPONINI <0.03   BNP: Invalid input(s): POCBNP CBG: No results for input(s): GLUCAP in the last 168 hours. D-Dimer No results for input(s): DDIMER in the last 72 hours. Hgb A1c No results for input(s): HGBA1C in the last 72 hours. Lipid Profile No results for input(s): CHOL, HDL, LDLCALC, TRIG, CHOLHDL, LDLDIRECT in the last 72 hours. Thyroid function studies No results for input(s): TSH, T4TOTAL, T3FREE, THYROIDAB in the last 72 hours.  Invalid input(s): FREET3 Anemia work up No results for input(s): VITAMINB12, FOLATE, FERRITIN, TIBC, IRON, RETICCTPCT in the last 72 hours. Urinalysis    Component Value Date/Time   COLORURINE YELLOW 10/13/2018 1023   APPEARANCEUR CLEAR 10/13/2018 1023   LABSPEC 1.010 10/13/2018 1023   PHURINE 8.0 10/13/2018 1023   GLUCOSEU NEGATIVE 10/13/2018 1023   GLUCOSEU neg 06/01/2010 1113   [place] 10/13/2018 1023  BILIRUBINUR NEGATIVE 10/13/2018 [place] 10/13/2018 1023   PROTEINUR NEGATIVE 10/13/2018 1023   NITRITE NEGATIVE 10/13/2018 1023   LEUKOCYTESUR NEGATIVE 10/13/2018 1023   Sepsis Labs Invalid input(s): PROCALCITONIN,  WBC,  LACTICIDVEN Microbiology Recent Results (from the past 240 hour(s))  Urine culture     Status: None   Collection Time: 10/13/18 10:23 AM  Result Value Ref Range Status   Specimen Description   Final    URINE, CLEAN CATCH Performed at [facility], [address] [place], [place] [address]    Special Requests   Final    NONE Performed at [facility], [address] [place], [place] [address]    Culture   Final    NO GROWTH Performed at [facility], [place] [address], [place], [place] [address]    Report Status 10/15/2018 FINAL  Final  Respiratory Panel by PCR     Status: None    Collection Time: 10/13/18 10:41 AM  Result Value Ref Range Status   Adenovirus NOT DETECTED NOT DETECTED Final   Coronavirus 229E NOT DETECTED NOT DETECTED Final    Comment: (NOTE) The Coronavirus on the Respiratory Panel, DOES NOT test for the novel  Coronavirus (2019 nCoV)    Coronavirus HKU1 NOT DETECTED NOT DETECTED Final   Coronavirus NL63 NOT DETECTED NOT DETECTED Final   Coronavirus OC43 NOT DETECTED NOT DETECTED Final   Metapneumovirus NOT DETECTED NOT DETECTED Final   Rhinovirus / Enterovirus NOT DETECTED NOT DETECTED Final   Influenza A NOT DETECTED NOT DETECTED Final   Influenza B NOT DETECTED NOT DETECTED Final   Parainfluenza Virus 1 NOT DETECTED NOT DETECTED Final   Parainfluenza Virus 2 NOT DETECTED NOT DETECTED Final   Parainfluenza Virus 3 NOT DETECTED NOT DETECTED Final   Parainfluenza Virus 4 NOT DETECTED NOT DETECTED Final   Respiratory Syncytial Virus NOT DETECTED NOT DETECTED Final   Bordetella pertussis NOT DETECTED NOT DETECTED Final   Chlamydophila pneumoniae NOT DETECTED NOT DETECTED Final   Mycoplasma pneumoniae NOT DETECTED NOT DETECTED Final    Comment: Performed at [facility], [place] [address] [place], [place] [address]  Culture, blood (routine x 2)     Status: None   Collection Time: 10/13/18 11:23 AM  Result Value Ref Range Status   Specimen Description   Final    RIGHT ANTECUBITAL BOTTLES DRAWN AEROBIC AND ANAEROBIC   Special Requests Blood Culture adequate volume  Final   Culture   Final    NO GROWTH 5 DAYS Performed at [facility], [address], [place], [place] [address]    Report Status 10/18/2018 FINAL  Final  Culture, blood (routine x 2)     Status: None   Collection Time: 10/13/18 11:37 AM  Result Value Ref Range Status   Specimen Description   Final    BLOOD RIGHT WRIST BOTTLES DRAWN AEROBIC AND ANAEROBIC   Special Requests Blood Culture adequate volume  Final   Culture   Final    NO GROWTH 5 DAYS Performed at [facility], [address], [place], [place] [address]    Report Status 10/18/2018 FINAL  Final  Expectorated sputum assessment w rflx to resp cult     Status: None   Collection Time: 10/13/18  3:58 PM  Result Value Ref Range Status   Specimen Description   Final    SPUTUM Performed at [facility], [place] [address] [place], [place] [address]    Special  Requests   Final    NONE Performed at [facility], [address] [place], [place] [address]    Sputum evaluation   Final    THIS SPECIMEN IS ACCEPTABLE FOR SPUTUM CULTURE Performed at [facility], [place] [address], [place], [place] [address]    Report Status 10/14/2018 FINAL  Final  Culture, respiratory     Status: None   Collection Time: 10/13/18  3:58 PM  Result Value Ref Range Status   Specimen Description SPUTUM  Final   Special Requests NONE Reflexed from [id number]  Final   Gram Stain   Final    ABUNDANT WBC PRESENT, PREDOMINANTLY PMN RARE YEAST Performed at [facility], [place] [address], [place], [place] [address]    Culture   Final    FEW CANDIDA ALBICANS RARE PSEUDOMONAS AERUGINOSA    Report Status 10/18/2018 FINAL  Final   Organism ID, Bacteria PSEUDOMONAS AERUGINOSA  Final      Susceptibility   Pseudomonas aeruginosa - MIC*    CEFTAZIDIME 4 SENSITIVE Sensitive     CIPROFLOXACIN <=0.25 SENSITIVE Sensitive     GENTAMICIN <=1 SENSITIVE Sensitive     IMIPENEM 1 SENSITIVE Sensitive     PIP/TAZO 8 SENSITIVE Sensitive     CEFEPIME 2 SENSITIVE Sensitive     * RARE PSEUDOMONAS AERUGINOSA  MRSA PCR Screening     Status: None   Collection Time: 10/14/18 11:39 AM  Result Value Ref Range Status   MRSA by PCR NEGATIVE NEGATIVE Final    Comment:        The GeneXpert MRSA Assay (FDA approved for NASAL specimens only), is one component of a comprehensive MRSA colonization surveillance program. It is not intended to diagnose MRSA infection nor to guide or monitor treatment for MRSA infections. Performed at [facility], [place] [address] [place], [place] [address]      Time coordinating discharge: 25 minutes      SIGNED:   Edwin Dada, MD  [facility] 10/18/2018, 2:49 PM

## 2018-10-18 NOTE — Consult Note (Signed)
   [facility] CM Inpatient Consult   10/18/2018  Kenneth [name] 09/19/1940 [id number]   Patient screened for high risk score for unplanned readmissions and hospitalizations to check if potential [facility] services are needed . Physician's progress notes  as copied - for Kenneth [name] is a 78 y.o.malewith medical history significant fortracheostomy for laryngeal malignancy,HTN,carotid artery stenosis,CVAwho presented to th ED with increasing difficulty breathing since dischargeafter recent hospitalization, withfever.  2/11-2/13- CAP-  Levaquin 3/2-3/6 - HCAP- Meropenem, Linezolid> dischaged on Keflex and Doxycycline Now presents with fever 102.1, WBC 21, Lactic acidosis, tachypnea.  CTA chest > No improvement in abnormal bilateral lung opacities since the CT on 09/25/2018,and right upper lobe involvement appears progressed.  Patient already transitioned home today.  Patient would benefits from EMMI follow up calls.   For questions contact:   Natividad Brood, RN BSN [facility] Liaison  [phone] business mobile phone Toll free office [phone]

## 2018-10-18 NOTE — Progress Notes (Signed)
ID NOTE COVID testing is negative. Fax to be in chart this am. No longer needs isolation  [facility] Infectious [facility] [phone]

## 2018-10-18 NOTE — Progress Notes (Signed)
SATURATION QUALIFICATIONS: (This note is used to comply with regulatory documentation for home oxygen)  Patient Saturations on Room Air at Rest 93%  Patient Saturations on Room Air while Ambulating 91%  Patient Saturations on 0 Liters of oxygen while Ambulating 91%  Please briefly explain why patient needs home oxygen: Pt does not need oxygen for ambulation

## 2018-10-19 DIAGNOSIS — J398 Other specified diseases of upper respiratory tract: Secondary | ICD-10-CM | POA: Diagnosis not present

## 2018-10-19 DIAGNOSIS — Z8521 Personal history of malignant neoplasm of larynx: Secondary | ICD-10-CM | POA: Diagnosis not present

## 2018-10-19 DIAGNOSIS — Z930 Tracheostomy status: Secondary | ICD-10-CM | POA: Diagnosis not present

## 2018-10-19 LAB — NOVEL CORONAVIRUS, NAA (HOSP ORDER, SEND-OUT TO REF LAB; TAT 18-24 HRS): SARS-CoV-2, NAA: NOT DETECTED

## 2018-10-20 DIAGNOSIS — J189 Pneumonia, unspecified organism: Secondary | ICD-10-CM | POA: Diagnosis not present

## 2018-10-20 DIAGNOSIS — I700 Atherosclerosis of aorta: Secondary | ICD-10-CM | POA: Diagnosis not present

## 2018-10-21 LAB — MISCELLANEOUS TEST

## 2018-10-23 ENCOUNTER — Other Ambulatory Visit: Payer: Self-pay

## 2018-10-23 NOTE — Patient Outreach (Signed)
[facility]) Care Management  10/23/2018  ALEXEI DOSWELL 1941-01-28 [id number]   EMMI- pneumonia RED ON EMMI ALERT Day # 3 Date: 3/32/2020 Red Alert Reason:  Been confused? Yes  Had diarrhea or felt sick to stomach? Yes  Swelling in hands/feet or changes in weight? Yes   Outreach attempt: spoke with spouse.  She is able to verify HIPAA.  She states that patient is doing so much better. She states that patient is still some weak but better than before.  She states that patient did have some diarrhea but that has cleared up and that patient has seen PCP for follow up and PCP to do a phone follow up this week.  She denies any present concerns or needs.    Plan: RN CM will close case.    Jone Baseman, RN, MSN [facility] Care Management Care Management Coordinator Direct Line [phone] Toll Free: [phone]  Fax: [phone]

## 2018-10-27 ENCOUNTER — Other Ambulatory Visit: Payer: Self-pay

## 2018-10-27 NOTE — Patient Outreach (Addendum)
[facility]) Care Management  10/27/2018  Kenneth [name] April 08, 1941 [id number]   EMMI- pneumonia RED ON EMMI ALERT Day # 8 Date: 10/26/2018 Red Alert Reason: Feeling worse overall?yes Had diarrhea or felt sick to stomach? yes   Outreach attempt: spoke with wife Kenneth [name].  She is able to verify HIPAA.  Addressed red alerts.  She states that they were not recorded correctly. She states that patient is getting better daily.  Patient not eating as much as usual but is drinking plenty of fluids including ensure.  She states patient also has some weakness but is feeling stronger daily.  She voices no concern for safety.  Discussed fall precautions to prevent falls. She denies any food insecurities and states they have no needs at this time.  Discussed signs of worsening pneumonia such as fever, chills, cough, increased congestion, and chest pain. She verbalized understanding and is appreciative of the call.     Plan: RN CM will close case.   Jone Baseman, RN, MSN [facility] Care Management Care Management Coordinator Direct Line [phone] Toll Free: [phone]  Fax: [phone]

## 2018-10-31 ENCOUNTER — Emergency Department (HOSPITAL_COMMUNITY)
Admission: EM | Admit: 2018-10-31 | Discharge: 2018-10-31 | Disposition: A | Discharge status: Home / Self Care | Payer: Medicare HMO | Attending: Emergency Medicine | Admitting: Emergency Medicine

## 2018-10-31 ENCOUNTER — Encounter (HOSPITAL_COMMUNITY): Payer: Self-pay | Admitting: Emergency Medicine

## 2018-10-31 ENCOUNTER — Other Ambulatory Visit: Payer: Self-pay

## 2018-10-31 ENCOUNTER — Emergency Department (HOSPITAL_COMMUNITY): Payer: Medicare HMO

## 2018-10-31 DIAGNOSIS — Z79899 Other long term (current) drug therapy: Secondary | ICD-10-CM | POA: Diagnosis not present

## 2018-10-31 DIAGNOSIS — Z951 Presence of aortocoronary bypass graft: Secondary | ICD-10-CM | POA: Diagnosis not present

## 2018-10-31 DIAGNOSIS — Z7982 Long term (current) use of aspirin: Secondary | ICD-10-CM | POA: Insufficient documentation

## 2018-10-31 DIAGNOSIS — Z87891 Personal history of nicotine dependence: Secondary | ICD-10-CM | POA: Diagnosis not present

## 2018-10-31 DIAGNOSIS — R0602 Shortness of breath: Secondary | ICD-10-CM | POA: Diagnosis not present

## 2018-10-31 DIAGNOSIS — Z8521 Personal history of malignant neoplasm of larynx: Secondary | ICD-10-CM | POA: Insufficient documentation

## 2018-10-31 DIAGNOSIS — R0600 Dyspnea, unspecified: Secondary | ICD-10-CM | POA: Insufficient documentation

## 2018-10-31 DIAGNOSIS — I10 Essential (primary) hypertension: Secondary | ICD-10-CM | POA: Diagnosis not present

## 2018-10-31 DIAGNOSIS — Z8673 Personal history of transient ischemic attack (TIA), and cerebral infarction without residual deficits: Secondary | ICD-10-CM | POA: Insufficient documentation

## 2018-10-31 DIAGNOSIS — Z930 Tracheostomy status: Secondary | ICD-10-CM | POA: Diagnosis not present

## 2018-10-31 DIAGNOSIS — I2510 Atherosclerotic heart disease of native coronary artery without angina pectoris: Secondary | ICD-10-CM | POA: Insufficient documentation

## 2018-10-31 DIAGNOSIS — Z430 Encounter for attention to tracheostomy: Secondary | ICD-10-CM | POA: Diagnosis not present

## 2018-10-31 NOTE — ED Triage Notes (Signed)
Per wife, pt have increased SOB especially with exertion since February. Wife reports multiple bouts of pneumonia. Pt has been admitted twice since then. Last admission was two weeks ago, but symptoms have worsened. Pt was tested for Covid 2 weeks ago, and it came back negative. 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

## 2018-10-31 NOTE — ED Provider Notes (Signed)
[facility] EMERGENCY DEPARTMENT Provider Note   CSN: [id number] Arrival date & time: 10/31/18  1239    History   Chief Complaint Chief Complaint  Patient presents with   Shortness of Breath    HPI Kenneth [name] is a 78 y.o. male.     Patient reports persistent dyspnea for several weeks.  Patient was admitted to the hospital 2 weeks ago for similar symptoms.  Covid test at that time negative.  He uses a nebulizer at home on a regular basis.  Patient has multiple health problems including anxiety, CAD, CVA, depression, hypertension, laryngeal carcinoma, stroke, trach scar, many others.  No substernal chest pain, diaphoresis, nausea.  Severity of symptoms moderate.  Symptoms are worsened by exertion.     Past Medical History:  Diagnosis Date   Anxiety    Arthritis    Back (05/04/2018)   ASCVD (arteriosclerotic cardiovascular disease)    Chronic lower back pain    "have it at night" (05/04/2018)   Coronary artery disease    a. 2003: s/p CABG x3V  b. 2007: cath with patent bypass grafts.  c. 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   Malodorous urine    "in the last 5wks; since I had trach put in" (05/04/2018)   Peripheral vascular disease ([place])    Stroke [facility])    "he's had several little strokes; many that he wasn't aware of" (05/04/2018)   Tobacco abuse    Tracheal stenosis     Patient Active Problem List   Diagnosis Date Noted   HCAP (healthcare-associated pneumonia) 10/14/2018   Acute on chronic respiratory failure with hypoxia ([place]) 09/06/2018   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   Pneumonia due to infectious organism    SOB (shortness of  breath) 09/05/2018   Carotid stenosis, symptomatic, with infarction ([place]) 05/04/2018   Ischemic stroke ([place]) 11/15/2017   TIA (transient ischemic attack) 11/14/2017   Constipation 09/15/2017   Chest pain 10/06/2016   Essential hypertension 10/06/2016   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   Dyspnea 12/05/2015   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   Aftercare following surgery of the circulatory system, NEC 08/03/2013   Occlusion and stenosis of carotid artery without mention of cerebral infarction 07/21/2012   Preop cardiovascular exam 05/08/2012   Carotid stenosis, bilateral 05/05/2012   CAD in native artery 08/13/2011   Malignant neoplasm of larynx ([place]) 12/14/2009   HLD (hyperlipidemia) 12/14/2009   CEREBROVASCULAR ACCIDENT 12/14/2009   GASTROESOPHAGEAL REFLUX DISEASE 12/14/2009   NEPHROLITHIASIS 12/14/2009    Past Surgical History:  Procedure Laterality Date   CAROTID STENT Right 06-13-12; 05/04/2018   CAROTID STENT INSERTION N/A 06/13/2012   Procedure: CAROTID STENT INSERTION;  Surgeon: Serafina Mitchell, MD;  Location: [facility] CATH LAB;  Service: Cardiovascular;  Laterality: N/A;   CATARACT EXTRACTION, BILATERAL Bilateral    COLONOSCOPY  11/10/2011   Dr. [name]: hemorrhoids, tubular adenoma   CORNEAL TRANSPLANT Bilateral    CORONARY ARTERY BYPASS GRAFT  ~ 2003   "CABG X3"   EYE SURGERY     INSERTION OF RETROGRADE CAROTID STENT Right 05/04/2018   Procedure: INSERTION OF RIGHT  CAROTID STENT using an ABBOT- XACT carotid stent system;  Surgeon:  Serafina Mitchell, MD;  Location: [facility];  Service: Vascular;  Laterality: Right;   LAPAROSCOPIC CHOLECYSTECTOMY  2007   LEFT HEART CATH AND CORS/GRAFTS ANGIOGRAPHY N/A 10/07/2016   Procedure: Left Heart Cath and Cors/Grafts Angiography;  Surgeon: Troy Sine, MD;  Location: [facility] CV LAB;  Service: Cardiovascular;  Laterality: N/A;   PARTIAL LARYNGECTOMY  1998   TRACHEOSTOMY  03/13/2018   "@ [place]"        Home  Medications    Prior to Admission medications   Medication Sig Start Date End Date Taking? Authorizing Provider  acetaminophen (TYLENOL) 500 MG tablet Take 500 mg by mouth every 6 (six) hours as needed.   Yes [provider]  albuterol (PROVENTIL) (2.5 MG/3ML) 0.083% nebulizer solution Take 3 mLs (2.5 mg total) by nebulization 3 (three) times daily. X 5 days and then q 6 hrs prn after that 09/29/18  Yes Emokpae, Courage, MD  amLODipine (NORVASC) 10 MG tablet Take 1 tablet by mouth once daily 10/10/18  Yes Branch, Alphonse Guild, MD  aspirin 325 MG tablet Take 1 tablet (325 mg total) by mouth daily. 11/16/17  Yes Johnson, Clanford L, MD  clopidogrel (PLAVIX) 75 MG tablet Take 1 tablet (75 mg total) by mouth daily. Patient taking differently: Take 75 mg by mouth every morning.  12/26/17  Yes BranchAlphonse Guild, MD  ezetimibe (ZETIA) 10 MG tablet Take 1 tablet by mouth once daily 10/10/18  Yes Branch, Alphonse Guild, MD  metoprolol tartrate (LOPRESSOR) 25 MG tablet Take 25 mg by mouth 2 (two) times daily.  03/16/17  Yes [provider]  nitroGLYCERIN (NITROSTAT) 0.4 MG SL tablet Place 1 tablet (0.4 mg total) under the tongue every 5 (five) minutes as needed for chest pain. 01/31/17 05/25/21 Yes Lendon Colonel, NP  omeprazole (PRILOSEC) 20 MG capsule Take 20 mg by mouth every morning.    Yes [provider]  PARoxetine (PAXIL) 20 MG tablet Take 20 mg by mouth at bedtime.    Yes [provider]  spironolactone (ALDACTONE) 25 MG tablet Take 25 mg by mouth every morning.    Yes [provider]    Family History Family History  Problem Relation Age of Onset   Dementia Mother    Heart disease Father    Cancer Brother    Heart disease Brother    Hyperlipidemia Brother    Cancer Brother    Colon cancer Neg Hx     Social History Social History   Tobacco Use   Smoking status: Former Smoker    Packs/day: 1.00    Years: 15.00    Pack years: 15.00     Types: Cigarettes    Start date: 04/22/1953    Last attempt to quit: 07/26/1970    Years since quitting: 48.2   Smokeless tobacco: Former Systems developer    Types: Chew    Quit date: 05/05/2002  Substance Use Topics   Alcohol use: Never    Alcohol/week: 0.0 standard drinks    Frequency: Never   Drug use: Never     Allergies   Nifedipine; Lorazepam; Other; Penicillins; Sulfacetamide; Sulfonamide derivatives; Erythromycin; Statins; and Vancomycin   Review of Systems Review of Systems  Unable to perform ROS: Acuity of condition     Physical Exam Updated Vital Signs BP 121/88 (BP Location: Right Arm)    Pulse 92    Temp 98.7 F (37.1 C) (Oral)    Resp (!) 21    SpO2 95%  Physical Exam Vitals signs and nursing note reviewed.  Constitutional:      Comments: No obvious respiratory distress  HENT:     Head: Normocephalic and atraumatic.  Eyes:     Conjunctiva/sclera: Conjunctivae normal.  Neck:     Musculoskeletal: Neck supple.     Comments: Trach lumen Cardiovascular:     Rate and Rhythm: Normal rate and regular rhythm.  Pulmonary:     Effort: Pulmonary effort is normal.     Breath sounds: Normal breath sounds.  Abdominal:     General: Bowel sounds are normal.     Palpations: Abdomen is soft.  Musculoskeletal: Normal range of motion.  Skin:    General: Skin is warm and dry.  Neurological:     Mental Status: He is alert and oriented to person, place, and time.  Psychiatric:        Behavior: Behavior normal.      ED Treatments / Results  Labs (all labs ordered are listed, but only abnormal results are displayed) Labs Reviewed - No data to display  EKG EKG Interpretation  Date/Time:  Tuesday October 31 2018 12:49:35 EDT Ventricular Rate:  72 PR Interval:    QRS Duration: 130 QT Interval:  393 QTC Calculation: 431 R Axis:   3 Text Interpretation:  Sinus rhythm Right bundle branch block Confirmed by Nat Christen [phone]) on 10/31/2018 1:06:17 PM   Radiology Dg Chest  Portable 1 View  Result Date: 10/31/2018 CLINICAL DATA:  Shortness of breath EXAM: PORTABLE CHEST 1 VIEW COMPARISON:  Chest radiograph and chest CT October 13, 2018 FINDINGS: Tracheostomy catheter tip is 5.0 cm above the carina. No pneumothorax. There is apparent scarring bilaterally, more on the right than on the left. Ill-defined airspace opacity persists on the right, similar to prior chest radiograph. No new opacity evident. Heart is borderline enlarged with pulmonary vascularity normal. Patient is status post internal mammary bypass grafting. No adenopathy. No bone lesions. IMPRESSION: Tracheostomy as described without pneumothorax. Areas of scarring and patchy airspace opacity, more on the right than the left. There is no significant change in appearance of the lungs compared to most recent studies. No new opacity evident. Stable cardiac silhouette. Electronically Signed   By: Lowella Grip III M.D.   On: 10/31/2018 13:39    Procedures Procedures (including critical care time)  Medications Ordered in ED Medications - No data to display   Initial Impression / Assessment and Plan / ED Course  I have reviewed the triage vital signs and the nursing notes.  Pertinent labs & imaging results that were available during my care of the patient were reviewed by me and considered in my medical decision making (see chart for details).        Patient reports persistent dyspnea for several weeks.  He appears in no acute distress.  His pulse ox is acceptable.  Chest x-ray shows no major changes from 10/13/2018.  He fell asleep in his room and exhibited no respiratory distress.  Patient is stable for outpatient management.  Final Clinical Impressions(s) / ED Diagnoses   Final diagnoses:  Dyspnea, unspecified type    ED Discharge Orders    None       Nat Christen, MD 10/31/18 1941

## 2018-10-31 NOTE — Discharge Instructions (Addendum)
Chest x-ray is unchanged from March 20.  Continue to use all of the medications you typically use at home.  I would encourage self-isolation for Covid concerns.

## 2018-11-02 DIAGNOSIS — Z430 Encounter for attention to tracheostomy: Secondary | ICD-10-CM | POA: Diagnosis not present

## 2018-11-02 DIAGNOSIS — Z930 Tracheostomy status: Secondary | ICD-10-CM | POA: Diagnosis not present

## 2018-11-03 DIAGNOSIS — R0602 Shortness of breath: Secondary | ICD-10-CM | POA: Diagnosis not present

## 2018-11-03 DIAGNOSIS — J189 Pneumonia, unspecified organism: Secondary | ICD-10-CM | POA: Diagnosis not present

## 2018-11-08 DIAGNOSIS — Z8521 Personal history of malignant neoplasm of larynx: Secondary | ICD-10-CM | POA: Diagnosis not present

## 2018-11-08 DIAGNOSIS — J398 Other specified diseases of upper respiratory tract: Secondary | ICD-10-CM | POA: Diagnosis not present

## 2018-11-08 DIAGNOSIS — Z930 Tracheostomy status: Secondary | ICD-10-CM | POA: Diagnosis not present

## 2018-11-09 ENCOUNTER — Telehealth: Payer: Self-pay | Admitting: Internal Medicine

## 2018-11-09 NOTE — Telephone Encounter (Signed)
Spoke with the pt and scheduled appt and made him aware to bring all meds and inhaler  Pt notified of new office location

## 2018-11-09 NOTE — Telephone Encounter (Signed)
Consult slot 11/10/18 but must bring all meds/ inhalers, solutions.

## 2018-11-10 ENCOUNTER — Ambulatory Visit: Payer: Medicare HMO | Admitting: Internal Medicine

## 2018-11-10 ENCOUNTER — Other Ambulatory Visit: Payer: Self-pay

## 2018-11-10 ENCOUNTER — Encounter: Payer: Self-pay | Admitting: Internal Medicine

## 2018-11-10 VITALS — BP 112/68 | HR 82 | Temp 98.2°F | Ht 66.0 in | Wt 172.0 lb

## 2018-11-10 DIAGNOSIS — Z930 Tracheostomy status: Secondary | ICD-10-CM

## 2018-11-10 DIAGNOSIS — R918 Other nonspecific abnormal finding of lung field: Secondary | ICD-10-CM | POA: Diagnosis not present

## 2018-11-10 DIAGNOSIS — R0609 Other forms of dyspnea: Secondary | ICD-10-CM | POA: Diagnosis not present

## 2018-11-10 DIAGNOSIS — R0600 Dyspnea, unspecified: Secondary | ICD-10-CM

## 2018-11-10 MED ORDER — ACETAMINOPHEN-CODEINE #3 300-30 MG PO TABS: 1.0000 | ORAL_TABLET | ORAL | 0 refills | Status: DC | PRN

## 2018-11-10 MED ORDER — ACETAMINOPHEN-CODEINE #3 300-30 MG PO TABS: 1.0000 | ORAL_TABLET | ORAL | 0 refills | Status: AC | PRN

## 2018-11-10 MED ORDER — PREDNISONE 10 MG PO TABS: ORAL_TABLET | ORAL | 0 refills | Status: DC

## 2018-11-10 MED ORDER — CIPROFLOXACIN HCL 500 MG PO TABS: 500.0000 mg | ORAL_TABLET | Freq: Two times a day (BID) | ORAL | 0 refills | Status: DC

## 2018-11-10 NOTE — Patient Instructions (Addendum)
Omeprazole 20 mg Take 30- 60 min before your first and last meals of the day   Cipro 500 mg twice daily x 2 weeks   Prednisone 10 mg take  4 each am x 2 days,  2 each am x 2 days,  1 each am x 2 days and stop   Keep using  Humidified trach collar as much as possible esp at night with the plug    For cough > mucinex dm 1200 every 12 hours and supplement with tylenol #3 one every 4 hours if needed  For breathing >  Albuterol nebulizer 2.5 mg every 4 hours and take the trach cap off      Please remember to go to the lab   department   for your tests - we will call you with the results when they are available.     Please schedule a follow up office visit in 2  weeks, sooner if needed  with all medications /inhalers/ solutions in hand so we can verify exactly what you are taking. This includes all medications from all doctors and over the counters

## 2018-11-10 NOTE — Progress Notes (Signed)
Subjective:    Patient ID: Kenneth [name], male    DOB: Jul 07, 1941,     MRN: [id number]    Brief patient profile:  86  yowm quit smoking 1972 s/p laryngeal Ca RT/surgery and new doe x 2016 referred to pulmonary clinic 12/05/2015 by Dr  Willey Blade with last eval by Kenneth [name] c/w new polyp    History of Present Illness  12/05/2015 1st [facility] office visit/ Kenneth [name]   Chief Complaint  Patient presents with  . Advice Only    REferred by Dr. Willey Blade; SOB, no chest tightness, no cough.  Abnormal PFT done 3/1 in [facility].   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 but does fine slow and flat eg  [employer]  rec Change Nexium to 40 mg (= 2 x 20)   Take  30-60 min before first meal of the day and Pepcid (famotidine)  20 mg one @  bedtime until return to see Dr Kenneth [name] and let him know if this helped GERD diet  There is no evidence of any significant lung disease - follow up here is as needed  03/06/2018 trach done at [place] and notes say pt could mow grass but pt says could only do riding mower with baseline doe= 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 Pulmonary Consult    Referred by Dr. Willey Blade. Pt c/o SOB since beginning of Feb 2020. He gets SOB walking short distances such as room to room. He has had to suction his trach more often- bloody yellow sputum with occ foul odor.  He is using his albuterol neb 2 x daily.   onset early Feb 2020 fever x one week comes and goes since then seems better just while in hospital  With w/u by ID during last admit to [facility] with temp to 102 on admit. covid neg/sputum grew out pseudomonas 3/25  p last ID note  Documented prompt response to IV meropenem and no need for further abx. Pseudomonas was  sensitive to cipro.   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     Also denies any obvious fluctuation of symptoms with weather or environmental changes or other aggravating or alleviating factors except as outlined above   No unusual exposure hx or h/o childhood pna/ asthma or knowledge of premature birth.  Current Allergies, Complete Past Medical History, Past Surgical History, Family History, and Social History were reviewed in [employer] record.  ROS  The following are not active complaints unless bolded Hoarseness, sore throat, dysphagia, dental problems, itching, sneezing,  nasal congestion or discharge of excess mucus or purulent secretions, ear ache,   fever, chills, sweats, unintended wt loss or wt gain, classically  pleuritic or exertional cp,  orthopnea pnd or arm/hand swelling  or leg swelling, presyncope, palpitations, abdominal pain, anorexia, nausea, vomiting, diarrhea  or change in bowel habits or change in bladder habits, change in stools or change in urine, dysuria, hematuria,  rash, arthralgias, visual complaints, headache, numbness, weakness or ataxia or problems with walking or coordination,  change in mood or  memory.        Current Meds  Medication Sig  . acetaminophen (TYLENOL) 500 MG tablet Take 500 mg by mouth every 6 (six) hours as needed.  Marland Kitchen albuterol (PROVENTIL) (2.5 MG/3ML) 0.083% nebulizer solution Take 3 mLs (2.5 mg total) by nebulization 3 (three) times daily. X 5 days and then q 6 hrs prn after that  . amLODipine (NORVASC) 10 MG tablet Take 1 tablet by mouth once daily  . aspirin 325 MG tablet Take 1  tablet (325 mg total) by mouth daily.  . clopidogrel (PLAVIX) 75 MG tablet Take 1 tablet (75 mg total) by mouth daily. (Patient taking differently: Take 75 mg by mouth every morning. )  . ezetimibe (ZETIA) 10 MG tablet Take 1 tablet by mouth once daily  . metoprolol tartrate (LOPRESSOR) 25 MG tablet Take 25 mg by mouth 2 (two) times daily.   . nitroGLYCERIN (NITROSTAT) 0.4 MG SL tablet Place 1 tablet (0.4 mg total) under the tongue every 5 (five) minutes as needed for chest pain.  Marland Kitchen omeprazole (PRILOSEC) 20 MG capsule Take 20 mg by mouth every morning.   Marland Kitchen PARoxetine (PAXIL) 20 MG tablet Take 20 mg by mouth at bedtime.   Marland Kitchen spironolactone (ALDACTONE) 25 MG tablet Take 25 mg by mouth every morning.                Objective:   Physical Exam      11/10/2018       172  12/05/15 182 lb 9.6 oz (82.827 kg)  08/07/15 182 lb (82.555 kg)  07/18/15 176 lb (79.833 kg)       amb somber wm/ very hopeless affect/attitude  With trach plug out, min stridor.  HEENT: nl dentition, turbinates bilaterally, and oropharynx. Nl external ear canals without cough reflex   NECK :  without JVD/Nodes/TM/ nl carotid upstrokes bilaterally   LUNGS: no acc muscle use,  Nl contour chest which is clear to A and P bilaterally without cough on insp or exp maneuvers   CV:  RRR  no s3 or murmur or increase in P2, and no edema   ABD:  soft and nontender with nl inspiratory excursion in the supine position. No bruits or organomegaly appreciated, bowel sounds nl  MS:  ext warm without deformities, calf tenderness, cyanosis or clubbing No obvious joint restrictions   SKIN: warm and dry without lesions    NEURO:  alert, approp, nl sensorium with  no motor or cerebellar deficits apparent.       I personally reviewed images and agree with radiology impression as follows:   Chest CT 10/13/2018 1. No evidence of acute pulmonary embolus. 2. No improvement in abnormal bilateral lung opacity since the CT on  09/25/2018, and right upper lobe involvement appears progressed. Underlying gas trapping and atelectasis. Favor acute viral/atypical respiratory infection superimposed on chronic lung disease. Noninfectious progression of chronic lung disease felt less likely. 3. 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 4. Aortic atherosclerosis status post CABG.  09/27/2018 swallow eval/ ST :  wnl    Labs ordered/ reviewed:      Chemistry  Component Value Date/Time   NA 136 11/10/2018 1547   K 4.1 11/10/2018 1547   CL 101 11/10/2018 1547   CO2 25 11/10/2018 1547   BUN 16 11/10/2018 1547   CREATININE 1.35 (H) 11/10/2018 1547      Component Value Date/Time   CALCIUM 9.8 11/10/2018 1547                             Lab Results  Component Value Date   WBC 11.5 (H) 11/10/2018   HGB 13.1 (L) 11/10/2018   HCT 40.7 11/10/2018   MCV 85.0 11/10/2018   PLT 455 (H) 11/10/2018       EOS           Absolute cells/microliter             265                                    11/10/2018         Lab Results  Component Value Date   TSH 2.19 11/10/2018      Lab Results  Component Value Date   ESRSEDRATE 55 (H) 11/10/2018       Labs ordered 11/10/2018    bnp     Assessment & Plan:

## 2018-11-11 ENCOUNTER — Encounter: Payer: Self-pay | Admitting: Internal Medicine

## 2018-11-11 DIAGNOSIS — R918 Other nonspecific abnormal finding of lung field: Secondary | ICD-10-CM | POA: Insufficient documentation

## 2018-11-11 LAB — BASIC METABOLIC PANEL
BUN/Creatinine Ratio: 12 (calc) (ref 6–22)
BUN: 16 mg/dL (ref 7–25)
CO2: 25 mmol/L (ref 20–32)
Calcium: 9.8 mg/dL (ref 8.6–10.3)
Chloride: 101 mmol/L (ref 98–110)
Creat: 1.35 mg/dL — ABNORMAL HIGH (ref 0.70–1.18)
Glucose, Bld: 94 mg/dL (ref 65–99)
Potassium: 4.1 mmol/L (ref 3.5–5.3)
Sodium: 136 mmol/L (ref 135–146)

## 2018-11-11 LAB — CBC WITH DIFFERENTIAL/PLATELET
Absolute Monocytes: 1024 cells/uL — ABNORMAL HIGH (ref 200–950)
Basophils Absolute: 92 cells/uL (ref 0–200)
Basophils Relative: 0.8 %
Eosinophils Absolute: 265 cells/uL (ref 15–500)
Eosinophils Relative: 2.3 %
HCT: 40.7 % (ref 38.5–50.0)
Hemoglobin: 13.1 g/dL — ABNORMAL LOW (ref 13.2–17.1)
Lymphs Abs: 1656 cells/uL (ref 850–3900)
MCH: 27.3 pg (ref 27.0–33.0)
MCHC: 32.2 g/dL (ref 32.0–36.0)
MCV: 85 fL (ref 80.0–100.0)
MPV: 9.9 fL (ref 7.5–12.5)
Monocytes Relative: 8.9 %
Neutro Abs: 8464 cells/uL — ABNORMAL HIGH (ref 1500–7800)
Neutrophils Relative %: 73.6 %
Platelets: 455 10*3/uL — ABNORMAL HIGH (ref 140–400)
RBC: 4.79 10*6/uL (ref 4.20–5.80)
RDW: 14.5 % (ref 11.0–15.0)
Total Lymphocyte: 14.4 %
WBC: 11.5 10*3/uL — ABNORMAL HIGH (ref 3.8–10.8)

## 2018-11-11 LAB — SEDIMENTATION RATE: Sed Rate: 55 mm/h — ABNORMAL HIGH (ref 0–20)

## 2018-11-11 LAB — BRAIN NATRIURETIC PEPTIDE: Brain Natriuretic Peptide: 21 pg/mL (ref <100)

## 2018-11-11 LAB — TSH: TSH: 2.19 mIU/L (ref 0.40–4.50)

## 2018-11-11 NOTE — Assessment & Plan Note (Addendum)
Onset of recurrent fever early feb 2020 -  10/13/18  pseudomonas sensitive to cipro > rx x 14 days with cipro 11/10/2018 >>>  Prompt resolution of fever on abx only to recur off abx is typical of a partially treated pna with organizing pna component also a concern with such a high ESR in this case so for that reason rec  Short term prednisone Longer term cipro then RTC with cxr and with all meds in hand using a trust but verify approach to confirm accurate Medication  Reconciliation The principal here is that until we are certain that the  patients are doing what we've asked, it makes no sense to ask them to do more.   Discussed in detail all the  indications, usual  risks and alternatives  relative to the benefits with patient who agrees to proceed with w/u and rx  as outlined.

## 2018-11-11 NOTE — Assessment & Plan Note (Signed)
Quit smoking 1972  PFT's  09/24/15   FEV1 2.08(82 % ) ratio 81  p 10 % improvement from saba p no rx prior to study with DLCO  58 % correct to 61 % for alv volume   - 12/05/2015  Walked RA x 3 laps @ 185 ft each stopped due to  End of study, nl pace, no desat,  Mild sob - Spirometry 12/05/2015  FEV1 1.78 (64%)  Ratio 74  s truncation  Not clear why he says to me he perceived no benefit from trach p reported to ENT he was feeling fine as judged by the fact he could mow grass all day (but only on a riding mower!)  And today showing very little insight into the purpose of "bypassing" the problem (his upper airway) as we should have seen him back her p trach if not improved ex tolerance.  However,  Now has additional issues with recurring fever/ infiltrates that needs to be addressed before additional w/u for chronic unresolved doe which I suspect are largely related to deconditioning and misunderstanding of how to use PMV Valve and manage his trach which I spent extra time with him and wife but deferred to [facility].

## 2018-11-11 NOTE — Assessment & Plan Note (Addendum)
Trach done 03/07/2019   >>>  Advised on humidifying trach with collar off at hs, avoid suctioning anything but the trach itself as he has good cough mechanics.     Total time devoted to counseling  > 50 % of 60 min office consultation:  review case with pt/wife re new problem and  discussion of options/alternatives/ personally creating written customized instructions  in presence of pt  then going over those specific  Instructions directly with the pt including how to use all of the meds but in particular covering each new medication in detail and the difference between the maintenance= "automatic" meds and the prns using an action plan format for the latter (If this problem/symptom => do that organization reading Left to right).  Please see AVS from this visit for a full list of these instructions which I personally wrote for this pt and  are unique to this visit.

## 2018-11-14 DIAGNOSIS — Z1283 Encounter for screening for malignant neoplasm of skin: Secondary | ICD-10-CM | POA: Diagnosis not present

## 2018-11-14 DIAGNOSIS — L570 Actinic keratosis: Secondary | ICD-10-CM | POA: Diagnosis not present

## 2018-11-14 DIAGNOSIS — D485 Neoplasm of uncertain behavior of skin: Secondary | ICD-10-CM | POA: Diagnosis not present

## 2018-11-14 DIAGNOSIS — L988 Other specified disorders of the skin and subcutaneous tissue: Secondary | ICD-10-CM | POA: Diagnosis not present

## 2018-11-14 DIAGNOSIS — C44329 Squamous cell carcinoma of skin of other parts of face: Secondary | ICD-10-CM | POA: Diagnosis not present

## 2018-11-14 DIAGNOSIS — L821 Other seborrheic keratosis: Secondary | ICD-10-CM | POA: Diagnosis not present

## 2018-11-15 ENCOUNTER — Other Ambulatory Visit: Payer: Self-pay | Admitting: Cardiology

## 2018-11-16 ENCOUNTER — Telehealth: Payer: Self-pay | Admitting: Internal Medicine

## 2018-11-16 NOTE — Telephone Encounter (Signed)
Call returned to patient wife, made aware I do not see a telephone encounter from [facility] but inquired as to if they had questions or concerns. Denies any needs or concerns. Nothing further needed at this time.

## 2018-11-19 DIAGNOSIS — Z930 Tracheostomy status: Secondary | ICD-10-CM | POA: Diagnosis not present

## 2018-11-19 DIAGNOSIS — Z8521 Personal history of malignant neoplasm of larynx: Secondary | ICD-10-CM | POA: Diagnosis not present

## 2018-11-19 DIAGNOSIS — J398 Other specified diseases of upper respiratory tract: Secondary | ICD-10-CM | POA: Diagnosis not present

## 2018-11-27 DIAGNOSIS — R69 Illness, unspecified: Secondary | ICD-10-CM | POA: Diagnosis not present

## 2018-11-28 DIAGNOSIS — C44329 Squamous cell carcinoma of skin of other parts of face: Secondary | ICD-10-CM | POA: Diagnosis not present

## 2018-12-04 ENCOUNTER — Encounter: Payer: Self-pay | Admitting: Internal Medicine

## 2018-12-04 ENCOUNTER — Ambulatory Visit: Payer: Medicare HMO | Admitting: Internal Medicine

## 2018-12-04 ENCOUNTER — Ambulatory Visit (INDEPENDENT_AMBULATORY_CARE_PROVIDER_SITE_OTHER): Payer: Medicare HMO

## 2018-12-04 ENCOUNTER — Other Ambulatory Visit: Payer: Self-pay

## 2018-12-04 VITALS — BP 130/60 | HR 63 | Temp 97.9°F | Ht 66.0 in | Wt 176.0 lb

## 2018-12-04 DIAGNOSIS — R0609 Other forms of dyspnea: Secondary | ICD-10-CM

## 2018-12-04 DIAGNOSIS — R918 Other nonspecific abnormal finding of lung field: Secondary | ICD-10-CM

## 2018-12-04 DIAGNOSIS — R0600 Dyspnea, unspecified: Secondary | ICD-10-CM

## 2018-12-04 DIAGNOSIS — R05 Cough: Secondary | ICD-10-CM | POA: Diagnosis not present

## 2018-12-04 MED ORDER — OMEPRAZOLE 20 MG PO CPDR: DELAYED_RELEASE_CAPSULE | ORAL | 11 refills | Status: DC

## 2018-12-04 MED ORDER — PREDNISONE 10 MG PO TABS: ORAL_TABLET | ORAL | 0 refills | Status: DC

## 2018-12-04 NOTE — Assessment & Plan Note (Addendum)
Quit smoking 1972  PFT's  09/24/15   FEV1 2.08(82 % ) ratio 81  p 10 % improvement from saba p no rx prior to study with DLCO  58 % correct to 61 % for alv volume   - 12/05/2015  Walked RA x 3 laps @ 185 ft each stopped due to  End of study, nl pace, no desat,  Mild sob - Spirometry 12/05/2015  FEV1 1.78 (64%)  Ratio 74  s truncation  - 12/04/2018   Walked RA  2 laps @  approx 241ft each @ mod fast pace  stopped due to  End of study, second lap s trach plug marked improvement  doe and allowed faster walk      Suspect much of his problem now is depresssion/ deconditioning and not limited airflow obst -  Reviewed with him and his wife again all the instructions we gave previously regarding how to take the medications designed to help improve cough and congestion including the use of a trach collar at bedtime to maintain humidity.  I suspect they are both struggling with the complexity of care and therefore I recommended he return for full medication reconciliation.  To keep things simple, I have asked the patient to first separate medicines that are perceived as maintenance, that is to be taken daily "no matter what", from those medicines that are taken on only on an as-needed basis and I have given the patient examples of both, and then return to see me to make sure he has it right before additional rx    I had an extended discussion with the patient/wife  reviewing all relevant studies completed to date and  Lasting 38minutes of a 40 minute visit  which included directly observing ambulatory 02 saturation study documented in a/p section of  today's  office note.  Each maintenance medication was reviewed in detail including most importantly the difference between maintenance and prns and under what circumstances the prns are to be triggered using an action plan format that is not reflected in the computer generated alphabetically organized AVS.     Please see AVS for specific instructions unique to  this visit that I personally wrote and verbalized to the the pt in detail and then reviewed with pt  by my nurse highlighting any changes in therapy recommended at today's visit .

## 2018-12-04 NOTE — Patient Instructions (Addendum)
I will ask Dr Charm Barges to address the issue with humidified trach collar at bedtime    GERD (REFLUX)  is an extremely common cause of respiratory symptoms just like yours , many times with no obvious heartburn at all.    It can be treated with medication, but also with lifestyle changes including elevation of the head of your bed (ideally with 6 -8inch blocks under the headboard of your bed),  Smoking cessation, avoidance of late meals, excessive alcohol, and avoid fatty foods, chocolate, peppermint, colas, red wine, and acidic juices such as orange juice.  NO MINT OR MENTHOL PRODUCTS SO NO COUGH DROPS  USE SUGARLESS CANDY INSTEAD (Jolley ranchers or Stover's or Life Savers) or even ice chips will also do - the key is to swallow to prevent all throat clearing. NO OIL BASED VITAMINS - use powdered substitutes.  Avoid fish oil when coughing.   Prednisone 10 mg take  4 each am x 2 days,   2 each am x 2 days,  1 each am x 2 days and stop    For cough > mucinex dm 1200 every 12 hours and supplement with tylenol #3  One-half  every 4 hours if needed  For breathing >  Albuterol nebulizer 2.5 mg every 4 hours and take the trach cap off     Please remember to go to the  x-ray department  for your tests - we will call you with the results when they are available     Please schedule a follow up office visit in 2 weeks, call sooner if needed with all medications /inhalers/ solutions in hand so we can verify exactly what you are taking. This includes all medications from all doctors and over the [place] separate them into two bags:  the ones you take automatically, no matter what, vs the ones you take just when you feel you need them "BAG #2 is UP TO YOU"  - this will really help [place] help you take your medications more effectively.  Add: if does respond to pred again rec trial of bud next ov once do full med rec

## 2018-12-04 NOTE — Progress Notes (Signed)
Subjective:    Patient ID: Kenneth [name], male    DOB: 10/26/1940,     MRN: [id number]    Brief patient profile:  55  yowm quit smoking 1972 s/p laryngeal Ca RT/surgery and new doe x 2016 referred to pulmonary clinic 12/05/2015 by Dr  Willey Blade with last eval by [name] c/w new polyp and trach placed Aug 2019    History of Present Illness  12/05/2015 1st [facility] office visit/ [name]   Chief Complaint  Patient presents with  . Advice Only    REferred by Dr. Willey Blade; SOB, no chest tightness, no cough.  Abnormal PFT done 3/1 in [facility].   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 but does fine slow and flat eg  [employer]  rec Change Nexium to 40 mg (= 2 x 20)   Take  30-60 min before first meal of the day and Pepcid (famotidine)  20 mg one @  bedtime until return to see Dr [name] and let him know if this helped GERD diet  There is no evidence of any significant lung disease - follow up here is as needed    03/06/2018 trach done at [place] and notes say pt could mow grass but pt says could only do riding mower with baseline doe= 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 Pulmonary Consult    Referred by Dr. Willey Blade. Pt c/o SOB since beginning of Feb 2020. He gets SOB walking short distances such as room to room. He has had to suction his trach more often- bloody yellow sputum with occ foul odor.  He is using his albuterol neb 2 x daily.   onset early Feb 2020 fever x one week comes and goes since then seems better just while in hospital  With w/u by ID during last admit [facility] with temp to 102 on admit. covid neg/sputum grew out pseudomonas 3/25  p last ID note  Documented prompt response to IV meropenem and no need for  further abx. Pseudomonas was sensitive to cipro. 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 but does fine slow and flat   Cough: yellow/ slt bloody  Sleeping: in recliner  better at 45 degrees x feb 2020 whereas prior to Feb able to sleep in flat bed with a couple of pillows SABA use: maybe twice daily / not sure it helps 02: none  rec Omeprazole 20 mg Take 30- 60 min before your first and last meals of the day  Cipro 500 mg twice daily x 2 weeks  Prednisone 10 mg take  4 each am x 2 days,  2 each am x 2 days,  1 each am x 2 days and stop  Keep using  Humidified trach collar as much as possible esp at night with the plug out  For cough > mucinex dm 1200 every 12 hours and supplement with tylenol #3 one every 4 hours if needed For breathing >  Albuterol nebulizer 2.5 mg every 4 hours and take the trach cap off  Please remember to go  to the lab   department   for your tests - we will call you with the results when they are available.  Please schedule a follow up office visit in 2  weeks, sooner if needed  with all medications /inhalers/ solutions in hand so we can verify exactly what you are taking. This includes all medications from all doctors and over the counters    12/04/2018  f/u ov/[name] re:  Pulmonary infiltrates / chronic cough - did improve on pred / did not use humidified air to trach collar or max gerd rx or mucinex dm or tyl #3 as directed "can't remember how to take meds"  Chief Complaint  Patient presents with  . 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 but does fine slow and flat  Cough: not following instructions to use mucinex dm or tyl #3/ minima mucoid suputum  Sleeping: 45 degrees/ no change since early feb 2020  SABA use: neb does help  02: none    No obvious day to day or daytime variability or assoc  purulent sputum or mucus plugs or  hemoptysis or cp or chest tightness, subjective wheeze or overt sinus or hb symptoms.    Also denies any obvious fluctuation of symptoms with weather or environmental changes or other aggravating or alleviating factors except as outlined above   No unusual exposure hx or h/o childhood pna/ asthma or knowledge of premature birth.  Current Allergies, Complete Past Medical History, Past Surgical History, Family History, and Social History were reviewed in [employer] record.  ROS  The following are not active complaints unless bolded Hoarseness, sore throat, dysphagia, dental problems, itching, sneezing,  nasal congestion or discharge of excess mucus or purulent secretions, ear ache,   fever, chills, sweats, unintended wt loss or wt gain, classically pleuritic or exertional cp,  orthopnea pnd or arm/hand swelling  or leg swelling, presyncope, palpitations, abdominal pain, anorexia, nausea, vomiting, diarrhea  or change in bowel habits or change in bladder habits, change in stools or change in urine, dysuria, hematuria,  rash, arthralgias, visual complaints, headache, numbness, weakness or ataxia or problems with walking or coordination,  change in mood or  memory.        Current Meds  Medication Sig  . acetaminophen (TYLENOL) 500 MG tablet Take 500 mg by mouth every 6 (six) hours as needed.  Marland Kitchen albuterol (PROVENTIL) (2.5 MG/3ML) 0.083% nebulizer solution Take 3 mLs (2.5 mg total) by nebulization 3 (three) times daily. X 5 days and then q 6 hrs prn after that  . amLODipine (NORVASC) 10 MG tablet Take 1 tablet by mouth once daily  . aspirin 325 MG tablet Take 1 tablet (325 mg total) by mouth daily.  . clopidogrel (PLAVIX) 75 MG tablet Take 1 tablet (75 mg total) by mouth daily. (Patient taking differently: Take 75 mg by mouth every morning. )  . ezetimibe (ZETIA) 10 MG tablet Take 1 tablet by mouth once daily  . metoprolol tartrate (LOPRESSOR) 25 MG tablet Take 25 mg by mouth 2  (two) times daily.   . nitroGLYCERIN (NITROSTAT) 0.4 MG SL tablet Place 1 tablet (0.4 mg total) under the tongue every 5 (five) minutes as needed for chest pain.  Marland Kitchen omeprazole (PRILOSEC) 20 MG capsule Take 20 mg by mouth every morning.   Marland Kitchen PARoxetine (PAXIL) 20  MG tablet Take 20 mg by mouth at bedtime.   Marland Kitchen spironolactone (ALDACTONE) 25 MG tablet Take 25 mg by mouth every morning.   . [DISCONTINUED] ciprofloxacin (CIPRO) 500 MG tablet Take 1 tablet (500 mg total) by mouth 2 (two) times daily.              Objective:   Physical Exam    12/04/2018        176   11/10/2018       172  12/05/15 182 lb 9.6 oz (82.827 kg)  08/07/15 182 lb (82.555 kg)  07/18/15 176 lb (79.833 kg)      amb wm/ brighter affect     HEENT: nl dentition, turbinates bilaterally, and oropharynx. Nl external ear canals without cough reflex   NECK :  without JVD/Nodes/TM/ nl carotid upstrokes bilaterally   LUNGS: no acc muscle use,  Nl contour chest  Mostly transmitted upper airway noise without cough on insp or exp maneuvers   CV:  RRR  no s3 or murmur or increase in P2, and no edema   ABD:  soft and nontender with nl inspiratory excursion in the supine position. No bruits or organomegaly appreciated, bowel sounds nl  MS:  Nl gait/ ext warm without deformities, calf tenderness, cyanosis or clubbing No obvious joint restrictions   SKIN: warm and dry without lesions    NEURO:  alert, approp, nl sensorium with  no motor or cerebellar deficits apparent.        CXR PA and Lateral:   12/04/2018 :    I personally reviewed images and agree with radiology impression as follows:    1. Tracheostomy tube terminates above the carina. 2. Persistent patchy unchanged airspace opacities bilaterally. 3. No pneumothorax.             Assessment & Plan:

## 2018-12-05 ENCOUNTER — Encounter: Payer: Self-pay | Admitting: Internal Medicine

## 2018-12-05 ENCOUNTER — Other Ambulatory Visit: Payer: Self-pay | Admitting: Internal Medicine

## 2018-12-05 DIAGNOSIS — R0602 Shortness of breath: Secondary | ICD-10-CM

## 2018-12-05 DIAGNOSIS — L570 Actinic keratosis: Secondary | ICD-10-CM | POA: Diagnosis not present

## 2018-12-05 DIAGNOSIS — R918 Other nonspecific abnormal finding of lung field: Secondary | ICD-10-CM

## 2018-12-05 DIAGNOSIS — C44329 Squamous cell carcinoma of skin of other parts of face: Secondary | ICD-10-CM | POA: Diagnosis not present

## 2018-12-05 NOTE — Assessment & Plan Note (Signed)
Onset of recurrent fever early feb 2020 -  10/13/18  pseudomonas sensitive to cipro > rx x 14 days with cipro 11/10/2018 > improved 12/04/2018 clinically though minimal improvement on cxr    He has clearly improved and no longer has fever although still has cough and dyspnea which are probably multifactorial.  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  Discussed in detail all the  indications, usual  risks and alternatives  relative to the benefits with patient who agrees to proceed with rx as outlined.    

## 2018-12-05 NOTE — Progress Notes (Signed)
Spoke with pt's spouse who was with him at appt yesterday and notified of results per Dr. Melvyn Novas. She verbalized understanding and denied any questions.

## 2018-12-08 ENCOUNTER — Telehealth: Payer: Self-pay | Admitting: *Deleted

## 2018-12-08 DIAGNOSIS — J398 Other specified diseases of upper respiratory tract: Secondary | ICD-10-CM | POA: Diagnosis not present

## 2018-12-08 DIAGNOSIS — Z930 Tracheostomy status: Secondary | ICD-10-CM | POA: Diagnosis not present

## 2018-12-08 DIAGNOSIS — Z8521 Personal history of malignant neoplasm of larynx: Secondary | ICD-10-CM | POA: Diagnosis not present

## 2018-12-08 NOTE — Telephone Encounter (Signed)

## 2018-12-11 ENCOUNTER — Ambulatory Visit (INDEPENDENT_AMBULATORY_CARE_PROVIDER_SITE_OTHER)
Admission: RE | Admit: 2018-12-11 | Discharge: 2018-12-11 | Disposition: A | Discharge status: Home / Self Care | Payer: Medicare HMO | Source: Ambulatory Visit | Attending: Internal Medicine | Admitting: Internal Medicine

## 2018-12-11 ENCOUNTER — Other Ambulatory Visit: Payer: Self-pay

## 2018-12-11 DIAGNOSIS — R0602 Shortness of breath: Secondary | ICD-10-CM

## 2018-12-11 DIAGNOSIS — R918 Other nonspecific abnormal finding of lung field: Secondary | ICD-10-CM | POA: Diagnosis not present

## 2018-12-11 DIAGNOSIS — J189 Pneumonia, unspecified organism: Secondary | ICD-10-CM | POA: Diagnosis not present

## 2018-12-12 NOTE — Progress Notes (Signed)
Left detailed msg ok per DPR

## 2018-12-16 DIAGNOSIS — R69 Illness, unspecified: Secondary | ICD-10-CM | POA: Diagnosis not present

## 2018-12-19 ENCOUNTER — Ambulatory Visit: Payer: Medicare HMO | Admitting: Internal Medicine

## 2018-12-19 ENCOUNTER — Other Ambulatory Visit: Payer: Self-pay

## 2018-12-19 ENCOUNTER — Encounter: Payer: Self-pay | Admitting: Internal Medicine

## 2018-12-19 ENCOUNTER — Telehealth: Payer: Self-pay | Admitting: Internal Medicine

## 2018-12-19 VITALS — BP 124/58 | HR 58 | Temp 98.9°F | Ht 66.0 in | Wt 178.4 lb

## 2018-12-19 DIAGNOSIS — R0600 Dyspnea, unspecified: Secondary | ICD-10-CM

## 2018-12-19 DIAGNOSIS — R0609 Other forms of dyspnea: Secondary | ICD-10-CM

## 2018-12-19 DIAGNOSIS — Z8521 Personal history of malignant neoplasm of larynx: Secondary | ICD-10-CM | POA: Diagnosis not present

## 2018-12-19 DIAGNOSIS — J398 Other specified diseases of upper respiratory tract: Secondary | ICD-10-CM | POA: Diagnosis not present

## 2018-12-19 DIAGNOSIS — Z930 Tracheostomy status: Secondary | ICD-10-CM | POA: Diagnosis not present

## 2018-12-19 DIAGNOSIS — R918 Other nonspecific abnormal finding of lung field: Secondary | ICD-10-CM | POA: Diagnosis not present

## 2018-12-19 NOTE — Progress Notes (Signed)
Subjective:   Patient ID: Kenneth [name], male    DOB: [birth year],     MRN: [id number]    Brief patient profile:  83  yowm quit smoking 1972 s/p laryngeal Ca RT/surgery and new doe x 2016 referred to pulmonary clinic 12/05/2015 by Dr  Willey Blade with last eval by [name] c/w new polyp and trach placed Aug 2019    History of Present Illness  12/05/2015 1st [facility] office visit/ Chung Chagoya   Chief Complaint  Patient presents with  . Advice Only    REferred by Dr. Willey Blade; SOB, no chest tightness, no cough.  Abnormal PFT done 3/1 in [facility].   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 but does fine slow and flat eg  [facility]  rec Change Nexium to 40 mg (= 2 x 20)   Take  30-60 min before first meal of the day and Pepcid (famotidine)  20 mg one @  bedtime until return to see Dr [name] and let him know if this helped GERD diet  There is no evidence of any significant lung disease - follow up here is as needed    03/06/2018 trach done at [facility] and notes say pt could mow grass but pt says could only do riding mower with baseline doe= 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 Pulmonary Consult    Referred by Dr. Willey Blade. Pt c/o SOB since beginning of Feb 2020. He gets SOB walking short distances such as room to room. He has had to suction his trach more often- bloody yellow sputum with occ foul odor.  He is using his albuterol neb 2 x daily.   onset early Feb 2020 fever x one week comes and goes since then seems better just while in hospital  With w/u by ID during last admit to [facility] with temp to 102 on admit. covid neg/sputum grew out pseudomonas 3/25  p last ID note  Documented prompt response to IV meropenem and no need for  further abx. Pseudomonas was sensitive to cipro. 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 but does fine slow and flat   Cough: yellow/ slt bloody  Sleeping: in recliner  better at 45 degrees x feb 2020 whereas prior to Feb able to sleep in flat bed with a couple of pillows SABA use: maybe twice daily / not sure it helps 02: none  rec Omeprazole 20 mg Take 30- 60 min before your first and last meals of the day  Cipro 500 mg twice daily x 2 weeks  Prednisone 10 mg take  4 each am x 2 days,  2 each am x 2 days,  1 each am x 2 days and stop  Keep using  Humidified trach collar as much as possible esp at night with the plug out  For cough > mucinex dm 1200 every 12 hours and supplement with tylenol #3 one every 4 hours if needed For breathing >  Albuterol nebulizer 2.5 mg every 4 hours and take the trach cap off  Please remember to go to  the lab   department   for your tests - we will call you with the results when they are available.  Please schedule a follow up office visit in 2  weeks, sooner if needed  with all medications /inhalers/ solutions in hand so we can verify exactly what you are taking. This includes all medications from all doctors and over the counters    12/04/2018  f/u ov/Nahzir Pohle re:  Pulmonary infiltrates / chronic cough - did improve on pred / did not use humidified air to trach collar or max gerd rx or mucinex dm or tyl #3 as directed "can't remember how to take meds"  Chief Complaint  Patient presents with  . 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 but does fine slow and flat  Cough: not following instructions to use mucinex dm or tyl #3 >> minimal mucoid suputum  Sleeping: 45 degrees/ no change since early feb 2020  SABA use: neb does help  02: none  rec I will ask Dr Charm Barges to address the issue with humidified trach collar at bedtime   GERD diet  Prednisone 10 mg take  4 each am x 2 days,   2 each am x 2 days,  1 each am x 2 days and stop  For cough > mucinex dm 1200 every 12 hours and supplement with tylenol #3  One-half  every 4 hours if needed For breathing >  Albuterol nebulizer 2.5 mg every 4 hours and take the trach cap off  Please remember to go to the  x-ray department  for your tests - we will call you with the results when they are available   Please schedule a follow up office visit in 2 weeks, call sooner if needed with all medications   12/19/2018  f/u ov/Liliya Fullenwider re:  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 Follow-up    Breathing is unchanged. He is coughing more-yellow to clear sputum when he suctions.   Dyspnea:  Walking around the house /legs are limiting Cough: "real dry" (not using humidfier and using guaifensin produce with antihistamines against advice)  Sleeping: 45 elevation/ no 02  SABA use  twice daily  Neb avg 02: none    No obvious day to day or daytime variability or assoc excess/ purulent sputum or mucus plugs or hemoptysis or cp or chest tightness, subjective wheeze or overt sinus or hb symptoms.   Sleeping as above  without nocturnal  or early am exacerbation  of respiratory  c/o's or need for noct saba. Also denies any obvious fluctuation of symptoms with weather or environmental changes or other aggravating or alleviating factors except as outlined above   No unusual exposure hx or h/o childhood pna/ asthma or knowledge of premature birth.  Current Allergies, Complete Past Medical History, Past Surgical History, Family History, and Social History were reviewed in [employer] record.  ROS  The following are not active complaints unless bolded Hoarseness, sore throat, dysphagia, dental problems, itching, sneezing,  nasal congestion or discharge of excess mucus or purulent secretions, ear ache,   fever, chills, sweats,  unintended wt loss or wt gain, classically pleuritic or exertional cp,  orthopnea pnd or arm/hand swelling  or leg swelling, presyncope, palpitations, abdominal pain, anorexia, nausea, vomiting, diarrhea  or change in bowel habits or change in bladder habits, change in stools or change in urine, dysuria, hematuria,  rash, arthralgias, visual complaints, headache, numbness, weakness or ataxia or problems with walking or coordination,  change in mood or  memory.        Current Meds  Medication Sig  . acetaminophen (TYLENOL) 500 MG tablet Take 500 mg by mouth every 6 (six) hours as needed.  Marland Kitchen acetaminophen-codeine (TYLENOL #3) 300-30 MG tablet Take 1 tablet by mouth every 4 (four) hours as needed for moderate pain.  Marland Kitchen albuterol (PROVENTIL) (2.5 MG/3ML) 0.083% nebulizer solution Take 3 mLs (2.5 mg total) by nebulization 3 (three) times daily. X 5 days and then q 6 hrs prn after that  . amLODipine (NORVASC) 10 MG tablet Take 1 tablet by mouth once daily  . aspirin 325 MG tablet Take 1 tablet (325 mg total) by mouth daily.  . clopidogrel (PLAVIX) 75 MG tablet Take 1 tablet (75 mg total) by mouth daily. (Patient taking differently: Take 75 mg by mouth every morning. )  . Dextromethorphan-guaiFENesin (TUSSIN DM MAX PO) Take by mouth. As directed as needed  . ezetimibe (ZETIA) 10 MG tablet Take 1 tablet by mouth once daily  . guaifenesin (HUMIBID E) 400 MG TABS tablet Take 400 mg by mouth every 4 (four) hours as needed.  . metoprolol tartrate (LOPRESSOR) 25 MG tablet Take 25 mg by mouth 2 (two) times daily.   . nitroGLYCERIN (NITROSTAT) 0.4 MG SL tablet Place 1 tablet (0.4 mg total) under the tongue every 5 (five) minutes as needed for chest pain.  Marland Kitchen omeprazole (PRILOSEC) 20 MG capsule Take 30- 60 min before your first and last meals of the day  . PARoxetine (PAXIL) 20 MG tablet Take 20 mg by mouth at bedtime.   Marland Kitchen spironolactone (ALDACTONE) 25 MG tablet Take 25 mg by mouth every morning.                   Objective:   Physical Exam   12/19/2018       178 12/04/2018        176   11/10/2018       172  12/05/15 182 lb 9.6 oz (82.827 kg)  08/07/15 182 lb (82.555 kg)  07/18/15 176 lb (79.833 kg)    amb wm nad   HEENT: nl dentition, turbinates bilaterally, and oropharynx. Nl external ear canals without cough reflex   NECK :  without JVD/Nodes/TM/ nl carotid upstrokes bilaterally   LUNGS: no acc muscle use,  Nl contour chest with mostly transmitted upper airway noise  without cough on insp or exp maneuvers   CV:  RRR  no s3 or murmur or increase in P2, and no edema   ABD:  soft and nontender with nl inspiratory excursion in the supine position. No bruits or organomegaly appreciated, bowel sounds nl  MS:  Nl gait/ ext warm without deformities, calf tenderness, cyanosis or clubbing No obvious joint restrictions   SKIN: warm and dry without lesions    NEURO:  alert, approp, nl sensorium with  no motor or cerebellar deficits apparent.                  Assessment & Plan:

## 2018-12-19 NOTE — Telephone Encounter (Signed)
Medication name and strength: Omeprazole 20mg  Provider: [facility] Pharmacy: [facility] Patient insurance ID: [id number] Phone: [phone]  Was the PA started on CMM?  Today online If yes, please enter the Key: Key: [id number] - PA Case ID: [id number] - Rx #: [id number]  Timeframe for approval/denial: 3-5 business days for determiniation

## 2018-12-19 NOTE — Patient Instructions (Signed)
Ok to use nebulizer before heavy exertion to see if helps.  Stop the Tussin and use mucinex dm up to 1200 mg every 12 hours as needed   Please schedule a follow up office visit in 4 weeks, sooner if needed with cxr on return

## 2018-12-20 ENCOUNTER — Encounter: Payer: Self-pay | Admitting: Internal Medicine

## 2018-12-20 NOTE — Telephone Encounter (Signed)
Checked for updates regarding this PA for omeprazole 20mg  in CoverMyMeds using the Key: [id number]. According to covermymeds, the "test claim pays and does not indicate prior authorization is needed for the requested medication/quantity under the patient's Part D prescription benefit."   I called [facility] in [place] to see if pt is able to receive this medication. Pharmacy tech I spoke to stated the med originally needed a PA, but now pt is able to pick it up without any issues.   I then called & spoke w/ pt's wife, Andee Poles, (DPR on file) in regards to this information. Pt wife verbalized understanding with no additional questions. Nothing further needed at this time.

## 2018-12-20 NOTE — Assessment & Plan Note (Addendum)
Onset of recurrent fever early feb 2020 CTa chest 10/13/18 1. No evidence of acute pulmonary embolus. 2. No improvement in abnormal bilateral lung opacity since the CT on 09/25/2018, and right upper lobe involvement appears progressed. 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 difficile colitis or or colonization with  resistant bugs  Discussed in detail all the  indications, usual  risks and alternatives  relative to the benefits with patient/wife who agree to proceed with conservative f/u as outlined  ie leave well enough alone for now but be on the look out for a definite change in sputum at the same time try to improve cough mechanics and mucociliary clearance by appropriate use of guaifenesin products without antihistamines.   >>> f/u with cxr 4 weeks

## 2018-12-20 NOTE — Assessment & Plan Note (Signed)
Quit smoking 1972  PFT's  09/24/15   FEV1 2.08(82 % ) ratio 81  p 10 % improvement from saba p no rx prior to study with DLCO  58 % correct to 61 % for alv volume   - 12/05/2015  Walked RA x 3 laps @ 185 ft each stopped due to  End of study, nl pace, no desat,  Mild sob - Spirometry 12/05/2015  FEV1 1.78 (64%)  Ratio 74  s truncation - 12/04/2018   Walked RA  2 laps @  approx 212ft each @ mod fast pace  stopped due to  End of study, second lap s trach plug helped doe and allowed faster walk   - 12/19/2018   [facility] RA x one lap =  approx 250 ft - stopped due to  Legs gave out with sats still 98% at moderate pace   Clearly not limited by breathing at this point nor in need of any oxygen.  I did advise him to try using the nebulizer right before he exercises to see if that helps his activity tolerance but otherwise he can just use the nebulizer up to every 4 hours as needed.   I had an extended discussion with the patient  / wife  reviewing all relevant studies completed to date and  lasting 15 to 20 minutes of a 25 minute visit  which included directly observing ambulatory 02 saturation study documented in a/p section of  today's  office note.  Each maintenance medication was reviewed in detail including most importantly the difference between maintenance and prns and under what circumstances the prns are to be triggered using an action plan format that is not reflected in the computer generated alphabetically organized AVS.     Please see AVS for specific instructions unique to this visit that I personally wrote and verbalized to the the pt in detail and then reviewed with pt  by my nurse highlighting any changes in therapy recommended at today's visit .

## 2018-12-20 NOTE — Assessment & Plan Note (Signed)
Trach done 03/06/2018 - wfu  He continues to struggle with understanding how to use humidity, when to suction the trach and when to use the Passy-Muir valve.  He plans to follow-up with ENT for further instruction.

## 2019-01-04 ENCOUNTER — Other Ambulatory Visit (HOSPITAL_COMMUNITY): Payer: Self-pay | Admitting: Internal Medicine

## 2019-01-04 ENCOUNTER — Other Ambulatory Visit: Payer: Self-pay | Admitting: Internal Medicine

## 2019-01-04 ENCOUNTER — Other Ambulatory Visit: Payer: Self-pay

## 2019-01-04 ENCOUNTER — Ambulatory Visit (HOSPITAL_COMMUNITY)
Admission: RE | Admit: 2019-01-04 | Discharge: 2019-01-04 | Disposition: A | Discharge status: Home / Self Care | Payer: Medicare HMO | Source: Ambulatory Visit | Attending: Internal Medicine | Admitting: Internal Medicine

## 2019-01-04 DIAGNOSIS — M79605 Pain in left leg: Secondary | ICD-10-CM

## 2019-01-04 DIAGNOSIS — M7989 Other specified soft tissue disorders: Secondary | ICD-10-CM

## 2019-01-04 DIAGNOSIS — R2242 Localized swelling, mass and lump, left lower limb: Secondary | ICD-10-CM | POA: Diagnosis not present

## 2019-01-08 DIAGNOSIS — Z930 Tracheostomy status: Secondary | ICD-10-CM | POA: Diagnosis not present

## 2019-01-08 DIAGNOSIS — Z8521 Personal history of malignant neoplasm of larynx: Secondary | ICD-10-CM | POA: Diagnosis not present

## 2019-01-08 DIAGNOSIS — J398 Other specified diseases of upper respiratory tract: Secondary | ICD-10-CM | POA: Diagnosis not present

## 2019-01-12 DIAGNOSIS — Z8521 Personal history of malignant neoplasm of larynx: Secondary | ICD-10-CM | POA: Diagnosis not present

## 2019-01-12 DIAGNOSIS — J386 Stenosis of larynx: Secondary | ICD-10-CM | POA: Diagnosis not present

## 2019-01-12 DIAGNOSIS — Z930 Tracheostomy status: Secondary | ICD-10-CM | POA: Diagnosis not present

## 2019-01-12 DIAGNOSIS — Z430 Encounter for attention to tracheostomy: Secondary | ICD-10-CM | POA: Diagnosis not present

## 2019-01-15 DIAGNOSIS — R69 Illness, unspecified: Secondary | ICD-10-CM | POA: Diagnosis not present

## 2019-01-16 ENCOUNTER — Ambulatory Visit: Payer: PRIVATE HEALTH INSURANCE | Admitting: Internal Medicine

## 2019-01-16 ENCOUNTER — Encounter: Payer: Self-pay | Admitting: Internal Medicine

## 2019-01-16 ENCOUNTER — Ambulatory Visit (INDEPENDENT_AMBULATORY_CARE_PROVIDER_SITE_OTHER): Payer: Medicare HMO

## 2019-01-16 DIAGNOSIS — R918 Other nonspecific abnormal finding of lung field: Secondary | ICD-10-CM

## 2019-01-16 DIAGNOSIS — Z930 Tracheostomy status: Secondary | ICD-10-CM | POA: Diagnosis not present

## 2019-01-16 NOTE — Patient Instructions (Signed)
No change in medications   Please remember to go to the  x-ray department  for your tests - we will call you with the results when they are available      Please schedule a follow up visit in 3 months but call sooner if needed

## 2019-01-16 NOTE — Progress Notes (Signed)
Subjective:   Patient ID: BUDD FREIERMUTH, male    DOB: 1941-07-12,     MRN: [id number]    Brief patient profile:  74  yowm quit smoking 1972 s/p laryngeal Ca RT/surgery and new doe x 2016 referred to pulmonary clinic 12/05/2015 by Dr  Willey Blade with last eval by [name] c/w new polyp and trach placed Aug 2019    History of Present Illness  12/05/2015 1st [facility] office visit/ Nevaen Tredway   Chief Complaint  Patient presents with   Advice Only    REferred by Dr. Willey Blade; SOB, no chest tightness, no cough.  Abnormal PFT done 3/1 in [facility].   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 but does fine slow and flat eg  [employer]  rec Change Nexium to 40 mg (= 2 x 20)   Take  30-60 min before first meal of the day and Pepcid (famotidine)  20 mg one @  bedtime until return to see Dr [name] and let him know if this helped GERD diet  There is no evidence of any significant lung disease - follow up here is as needed    03/06/2018 trach done at [facility] and notes say pt could mow grass but pt says could only do riding mower with baseline doe= 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 Willey Blade. Pt c/o SOB since beginning of Feb 2020. He gets SOB walking short distances such as room to room. He has had to suction his trach more often- bloody yellow sputum with occ foul odor.  He is using his albuterol neb 2 x daily.   onset early Feb 2020 fever x one week comes and goes since then seems better just while in hospital  With w/u by ID during last admit to [facility] with temp to 102 on admit. covid neg/sputum grew out pseudomonas 3/25  p last ID note  Documented prompt response to IV meropenem and no need for  further abx. Pseudomonas was sensitive to cipro. 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 but does fine slow and flat   Cough: yellow/ slt bloody  Sleeping: in recliner  better at 45 degrees x feb 2020 whereas prior to Feb able to sleep in flat bed with a couple of pillows SABA use: maybe twice daily / not sure it helps 02: none  rec Omeprazole 20 mg Take 30- 60 min before your first and last meals of the day  Cipro 500 mg twice daily x 2 weeks  Prednisone 10 mg take  4 each am x 2 days,  2 each am x 2 days,  1 each am x 2 days and stop  Keep using  Humidified trach collar as much as possible esp at night with the plug out  For cough > mucinex dm 1200 every 12 hours and supplement with tylenol #3 one every 4 hours if needed For breathing >  Albuterol nebulizer 2.5 mg every 4 hours and take the trach cap off  Please remember to go to  the lab   department   for your tests - we will call you with the results when they are available.  Please schedule a follow up office visit in 2  weeks, sooner if needed  with all medications /inhalers/ solutions in hand so we can verify exactly what you are taking. 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 but does fine slow and flat  Cough: not following instructions to use mucinex dm or tyl #3 >> minimal mucoid suputum  Sleeping: 45 degrees/ no change since early feb 2020  SABA use: neb does help  02: none  rec I will ask Dr Charm Barges to address the issue with humidified trach collar at bedtime    GERD diet  Prednisone 10 mg take  4 each am x 2 days,   2 each am x 2 days,  1 each am x 2 days and stop  For cough > mucinex dm 1200 every 12 hours and supplement with tylenol #3  One-half  every 4 hours if needed For breathing >  Albuterol nebulizer 2.5 mg every 4 hours and take the trach cap off  Please remember to go to the  x-ray department  for your tests - we will call you with the results when they are available     12/19/2018  f/u ov/Neola Worrall re:  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 He is coughing more-yellow to clear sputum when he suctions.   Dyspnea:  Walking around the house /legs are limiting Cough: "real dry" (not using humidfier and using guaifensin produce with antihistamines against advice)  Sleeping: 45 elevation/ if not coughs  SABA use  twice daily  Neb avg rec Ok to use nebulizer before heavy exertion to see if helps. Stop the Tussin and use mucinex dm up to 1200 mg every 12 hours as needed  Please schedule a follow up office visit in 4 weeks, sooner if needed with cxr on return    01/16/2019  f/u ov/Dan Dissinger re: gradually improving doe / swallowing ok  Chief Complaint  Patient presents with   Follow-up    Breathing has been better. He has noticed some swelling in his feet for a month.    Dyspnea:  MMRC3 = can't walk 100 yards even at a slow pace at a flat grade s stopping due to sob   Cough: dry  Sleeping: 45 degrees recliner/ uses humidified air for trach working well   SABA use: avg bid but no better p neb with ex  02: no   No obvious day to day or daytime variability or assoc excess/ purulent sputum or mucus plugs or hemoptysis or cp or chest tightness, subjective wheeze or overt sinus or hb symptoms.   Sleeping as above without nocturnal  or early am exacerbation  of respiratory  c/o's or need for noct saba. Also denies  any obvious fluctuation of symptoms with weather  or environmental changes or other aggravating or alleviating factors except as outlined above   No unusual exposure hx or h/o childhood pna/ asthma or knowledge of premature birth.  Current Allergies, Complete Past Medical History, Past Surgical History, Family History, and Social History were reviewed in [employer] record.  ROS  The following are not active complaints unless bolded Hoarseness, sore throat, dysphagia, dental problems, itching, sneezing,  nasal congestion or discharge of excess mucus or purulent secretions, ear ache,   fever, chills, sweats, unintended wt loss or wt gain, classically pleuritic or exertional cp,  orthopnea pnd or arm/hand swelling  or leg swelling, presyncope, palpitations, abdominal pain, anorexia, nausea, vomiting, diarrhea  or change in bowel habits or change in bladder habits, change in stools or change in urine, dysuria, hematuria,  rash, arthralgias, visual complaints, headache, numbness, weakness or ataxia or problems with walking or coordination,  change in mood or  memory.        Current Meds  Medication Sig   acetaminophen (TYLENOL) 500 MG tablet Take 500 mg by mouth every 6 (six) hours as needed.   acetaminophen-codeine (TYLENOL #3) 300-30 MG tablet Take 1 tablet by mouth every 4 (four) hours as needed for moderate pain.   albuterol (PROVENTIL) (2.5 MG/3ML) 0.083% nebulizer solution Take 3 mLs (2.5 mg total) by nebulization 3 (three) times daily. X 5 days and then q 6 hrs prn after that   amLODipine (NORVASC) 10 MG tablet Take 1 tablet by mouth once daily   aspirin 325 MG tablet Take 1 tablet (325 mg total) by mouth daily.   clopidogrel (PLAVIX) 75 MG tablet Take 1 tablet (75 mg total) by mouth daily. (Patient taking differently: Take 75 mg by mouth every morning. )   Dextromethorphan-guaiFENesin (TUSSIN DM MAX PO) Take by mouth. As directed as needed   ezetimibe (ZETIA) 10 MG tablet Take 1 tablet by mouth once daily    guaifenesin (HUMIBID E) 400 MG TABS tablet Take 400 mg by mouth every 4 (four) hours as needed.   metoprolol tartrate (LOPRESSOR) 25 MG tablet Take 25 mg by mouth 2 (two) times daily.    nitroGLYCERIN (NITROSTAT) 0.4 MG SL tablet Place 1 tablet (0.4 mg total) under the tongue every 5 (five) minutes as needed for chest pain.   omeprazole (PRILOSEC) 20 MG capsule Take 30- 60 min before your first and last meals of the day   PARoxetine (PAXIL) 20 MG tablet Take 20 mg by mouth at bedtime.    spironolactone (ALDACTONE) 25 MG tablet Take 25 mg by mouth every morning.                  Objective:   Physical Exam  01/16/2019        179  12/19/2018       178 12/04/2018        176   11/10/2018       172  12/05/15 182 lb 9.6 oz (82.827 kg)  08/07/15 182 lb (82.555 kg)  07/18/15 176 lb (79.833 kg)    Hoarse wm nad / trach in place   HEENT: Top dentures/ nl turbinates bilaterally, and oropharynx. Nl external ear canals without cough reflex   NECK :  without JVD/Nodes/TM/ nl carotid upstrokes bilaterally   LUNGS: no acc muscle use,  Nl contour chest lots of upper airway transmitted noise  without cough on insp or exp maneuvers   CV:  RRR  no s3 or murmur or increase in P2, and trace ankle pitting bilaterally   ABD:  soft and nontender with nl inspiratory excursion in the supine position. No bruits or organomegaly appreciated, bowel sounds nl  MS:  Nl gait/ ext warm without deformities, calf tenderness, cyanosis or clubbing No obvious joint restrictions   SKIN: warm and dry without lesions    NEURO:  alert, approp, nl sensorium with  no motor or cerebellar deficits apparent.         CXR PA and Lateral:   01/16/2019 :    I personally reviewed images and agree with radiology impression as follows:    No significant change in bilateral lung opacities compared to prior exam, most consistent with chronic lung disease or scarring. As        Assessment & Plan:

## 2019-01-19 DIAGNOSIS — Z930 Tracheostomy status: Secondary | ICD-10-CM | POA: Diagnosis not present

## 2019-01-19 DIAGNOSIS — J398 Other specified diseases of upper respiratory tract: Secondary | ICD-10-CM | POA: Diagnosis not present

## 2019-01-19 DIAGNOSIS — Z8521 Personal history of malignant neoplasm of larynx: Secondary | ICD-10-CM | POA: Diagnosis not present

## 2019-01-20 ENCOUNTER — Encounter: Payer: Self-pay | Admitting: Internal Medicine

## 2019-01-20 NOTE — Assessment & Plan Note (Signed)
Trach done 03/06/2018 - wfu  Doing better with concepts of humidification/ trach care.  I had an extended discussion with the patient and his wife reviewing all relevant studies completed to date and  lasting 15 to 20 minutes of a 25 minute visit    Each maintenance medication was reviewed in detail including most importantly the difference between maintenance and prns and under what circumstances the prns are to be triggered using an action plan format that is not reflected in the computer generated alphabetically organized AVS.     Please see AVS for specific instructions unique to this visit that I personally wrote and verbalized to the the pt in detail and then reviewed with pt  by my nurse highlighting any  changes in therapy recommended at today's visit to their plan of care.

## 2019-01-20 NOTE — Assessment & Plan Note (Signed)
Onset of recurrent fever early feb 2020 CTa chest 10/13/18 1. No evidence of acute pulmonary embolus. 2. No improvement in abnormal bilateral lung opacity since the CT on 09/25/2018, and right upper lobe involvement appears progressed. 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  No change in rx needed > f/u at 3 months

## 2019-01-22 DIAGNOSIS — L820 Inflamed seborrheic keratosis: Secondary | ICD-10-CM | POA: Diagnosis not present

## 2019-01-22 DIAGNOSIS — C44629 Squamous cell carcinoma of skin of left upper limb, including shoulder: Secondary | ICD-10-CM | POA: Diagnosis not present

## 2019-01-22 DIAGNOSIS — L570 Actinic keratosis: Secondary | ICD-10-CM | POA: Diagnosis not present

## 2019-01-22 DIAGNOSIS — Z85828 Personal history of other malignant neoplasm of skin: Secondary | ICD-10-CM | POA: Diagnosis not present

## 2019-01-22 DIAGNOSIS — C44621 Squamous cell carcinoma of skin of unspecified upper limb, including shoulder: Secondary | ICD-10-CM | POA: Diagnosis not present

## 2019-01-22 DIAGNOSIS — L708 Other acne: Secondary | ICD-10-CM | POA: Diagnosis not present

## 2019-01-24 ENCOUNTER — Other Ambulatory Visit: Payer: Self-pay | Admitting: Cardiology

## 2019-01-30 DIAGNOSIS — R69 Illness, unspecified: Secondary | ICD-10-CM | POA: Diagnosis not present

## 2019-02-06 DIAGNOSIS — Z930 Tracheostomy status: Secondary | ICD-10-CM | POA: Diagnosis not present

## 2019-02-06 DIAGNOSIS — Z8521 Personal history of malignant neoplasm of larynx: Secondary | ICD-10-CM | POA: Diagnosis not present

## 2019-02-06 DIAGNOSIS — J398 Other specified diseases of upper respiratory tract: Secondary | ICD-10-CM | POA: Diagnosis not present

## 2019-02-13 DIAGNOSIS — R69 Illness, unspecified: Secondary | ICD-10-CM | POA: Diagnosis not present

## 2019-02-13 DIAGNOSIS — C44629 Squamous cell carcinoma of skin of left upper limb, including shoulder: Secondary | ICD-10-CM | POA: Diagnosis not present

## 2019-02-13 DIAGNOSIS — C44621 Squamous cell carcinoma of skin of unspecified upper limb, including shoulder: Secondary | ICD-10-CM | POA: Diagnosis not present

## 2019-02-15 ENCOUNTER — Encounter: Payer: Self-pay | Admitting: Cardiology

## 2019-02-15 ENCOUNTER — Other Ambulatory Visit: Payer: Self-pay

## 2019-02-15 ENCOUNTER — Ambulatory Visit (INDEPENDENT_AMBULATORY_CARE_PROVIDER_SITE_OTHER): Payer: Medicare HMO | Admitting: Cardiology

## 2019-02-15 ENCOUNTER — Encounter: Payer: Self-pay | Admitting: Internal Medicine

## 2019-02-15 VITALS — BP 140/72 | HR 53 | Temp 96.9°F | Ht 66.0 in | Wt 180.0 lb

## 2019-02-15 DIAGNOSIS — R001 Bradycardia, unspecified: Secondary | ICD-10-CM | POA: Diagnosis not present

## 2019-02-15 DIAGNOSIS — I10 Essential (primary) hypertension: Secondary | ICD-10-CM | POA: Diagnosis not present

## 2019-02-15 DIAGNOSIS — R0602 Shortness of breath: Secondary | ICD-10-CM | POA: Diagnosis not present

## 2019-02-15 DIAGNOSIS — I2510 Atherosclerotic heart disease of native coronary artery without angina pectoris: Secondary | ICD-10-CM

## 2019-02-15 DIAGNOSIS — E782 Mixed hyperlipidemia: Secondary | ICD-10-CM

## 2019-02-15 MED ORDER — EZETIMIBE 10 MG PO TABS: 10.0000 mg | ORAL_TABLET | Freq: Every day | ORAL | 3 refills | Status: DC

## 2019-02-15 NOTE — Patient Instructions (Signed)

## 2019-02-15 NOTE — Progress Notes (Signed)
Clinical Summary Kenneth [name] is a 78 y.o.male seen today for follow up of the following medical problems.   1. CAD  - prior CABG in 2003, repeat cath 2007 with patent grafts.  - 07/2011 MPI low risk study, no ischemia.  - echo 04/2012 LVEF 65-60%, grade II diastolic dysfunction  - 11/7015 echo showed LVEF 65-70%, grade II diastolic dysfunction.  - 11/2013 MPI no ischemia.  - 10/2017 echo LVEF 60-65%, no WMAs, normal diastolic function   01/9389 nuclear stress: no ischemia - no recent chest pain. Sedentary lifestyle, chrionic SOB/DOE stable with higher levels of activity     2. Hyperlipidemia  - reported history of rash on statin, he reports tried multiple and all caused rash -05/2017 TC 199 TG 212 HDL 35 LDL 122 - he is on zetia  - most recent labs with pcp   3. HTN  - elevated in clinic this AM but by review of several other recent visits has been at goal - compliant with meds  4. Carotid stenosis  - prior carotid stenting  - followed by vascular, recs have been for DAPT  5. SOB - remote 10-15 year history of smoking. Used to work in [place], mainly with walking up incline.  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  - followed by pulm Notes mention CT imaging consistent with slowly resolving ALI, lung imaging has show consistent interstitial findings.    6. Bradycardia - notes some HRs in 40sat times. Denies any symptoms.  - no recent lightheadedness or dizziness  7. Throat cancer s/p radiation and surgery - followed at [facility]  - has tracheostomy  8. CVA - admit 10/2016 with small cortical based infarcts in right parietal lobe, associated hemorrhage - right ICA 50-69% 10/2017   Past Medical History:  Diagnosis Date  . Anxiety   . Arthritis    Back (05/04/2018)   . ASCVD (arteriosclerotic cardiovascular disease)   . Chronic lower back pain    "have it at night" (05/04/2018)  . Coronary artery disease    a. 2003: s/p CABG x3V  b. 2007: cath with patent bypass grafts.  c. 09/2016: [place] with cath showing patent bypass grafts, medical Rx recommended  . CVA (cerebral vascular accident) ([place]) 10/2017   "little numb on my left face since" (05/04/2018)  . Depression   . GERD (gastroesophageal reflux disease)   . High triglycerides   . History of kidney stones   . Hypertension   . 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 Malodorous urine    "in the last 5wks; since I had trach put in" (05/04/2018)  . Peripheral vascular disease ([place])   . Stroke [facility])    "he's had several little strokes; many that he wasn't aware of" (05/04/2018)  . Tobacco abuse   . Tracheal stenosis      Allergies  Allergen Reactions  . Nifedipine Other (See Comments)  . Lorazepam Other (See Comments)    REACTION: Alters mental status. "Turns into maniac" REACTION: Alters mental status. "Turns into maniac" REACTION: Alters mental status. "Turns into maniac"  . Other     REACTION: Unknown to patient. States that MD states allergy per medical records  . Penicillins Hives    Has patient had a PCN reaction causing immediate rash, facial/tongue/throat swelling, SOB or lightheadedness with hypotension: Yes Has  patient had a PCN reaction causing severe rash involving mucus membranes or skin necrosis: No Has patient had a PCN reaction that required hospitalization No Has patient had a PCN reaction occurring within the last 10 years: No If all of the above answers are "NO", then may proceed with Cephalosporin use.  HAS TOLERATED: cephalexin  . Sulfacetamide     REACTION: Unknown to patient. States that MD states allergy per medical records  . Sulfonamide Derivatives Other (See Comments)    REACTION: Unknown to patient. States that MD states allergy per medical records  . Erythromycin Rash   . Statins Itching and Rash  . Vancomycin Itching     Current Outpatient Medications  Medication Sig Dispense Refill  . acetaminophen (TYLENOL) 500 MG tablet Take 500 mg by mouth every 6 (six) hours as needed.    Marland Kitchen acetaminophen-codeine (TYLENOL #3) 300-30 MG tablet Take 1 tablet by mouth every 4 (four) hours as needed for moderate pain.    Marland Kitchen albuterol (PROVENTIL) (2.5 MG/3ML) 0.083% nebulizer solution Take 3 mLs (2.5 mg total) by nebulization 3 (three) times daily. X 5 days and then q 6 hrs prn after that 75 mL 12  . amLODipine (NORVASC) 10 MG tablet Take 1 tablet by mouth once daily 90 tablet 3  . aspirin 325 MG tablet Take 1 tablet (325 mg total) by mouth daily.    . clopidogrel (PLAVIX) 75 MG tablet Take 1 tablet by mouth once daily 90 tablet 3  . Dextromethorphan-guaiFENesin (TUSSIN DM MAX PO) Take by mouth. As directed as needed    . ezetimibe (ZETIA) 10 MG tablet Take 1 tablet by mouth once daily 30 tablet 6  . guaifenesin (HUMIBID E) 400 MG TABS tablet Take 400 mg by mouth every 4 (four) hours as needed.    . metoprolol tartrate (LOPRESSOR) 25 MG tablet Take 25 mg by mouth 2 (two) times daily.     . nitroGLYCERIN (NITROSTAT) 0.4 MG SL tablet Place 1 tablet (0.4 mg total) under the tongue every 5 (five) minutes as needed for chest pain. 25 tablet 3  . omeprazole (PRILOSEC) 20 MG capsule Take 30- 60 min before your first and last meals of the day 60 capsule 11  . PARoxetine (PAXIL) 20 MG tablet Take 20 mg by mouth at bedtime.     Marland Kitchen spironolactone (ALDACTONE) 25 MG tablet Take 25 mg by mouth every morning.      No current facility-administered medications for this visit.      Past Surgical History:  Procedure Laterality Date  . CAROTID STENT Right 06-13-12; 05/04/2018  . CAROTID STENT INSERTION N/A 06/13/2012   Procedure: CAROTID STENT INSERTION;  Surgeon: Serafina Mitchell, MD;  Location: [facility] CATH LAB;  Service: Cardiovascular;  Laterality: N/A;  . CATARACT EXTRACTION, BILATERAL  Bilateral   . COLONOSCOPY  11/10/2011   Dr. [name]: hemorrhoids, tubular adenoma  . CORNEAL TRANSPLANT Bilateral   . CORONARY ARTERY BYPASS GRAFT  ~ 2003   "CABG X3"  . EYE SURGERY    . INSERTION OF RETROGRADE CAROTID STENT Right 05/04/2018   Procedure: INSERTION OF RIGHT  CAROTID STENT using an ABBOT- XACT carotid stent system;  Surgeon: Serafina Mitchell, MD;  Location: [facility];  Service: Vascular;  Laterality: Right;  . LAPAROSCOPIC CHOLECYSTECTOMY  2007  . LEFT HEART CATH AND CORS/GRAFTS ANGIOGRAPHY N/A 10/07/2016   Procedure: Left Heart Cath and Cors/Grafts Angiography;  Surgeon: Troy Sine, MD;  Location: [facility] CV LAB;  Service: Cardiovascular;  Laterality: N/A;  . PARTIAL LARYNGECTOMY  1998  . TRACHEOSTOMY  03/13/2018   "@ [facility]"     Allergies  Allergen Reactions  . Nifedipine Other (See Comments)  . Lorazepam Other (See Comments)    REACTION: Alters mental status. "Turns into maniac" REACTION: Alters mental status. "Turns into maniac" REACTION: Alters mental status. "Turns into maniac"  . Other     REACTION: Unknown to patient. States that MD states allergy per medical records  . Penicillins Hives    Has patient had a PCN reaction causing immediate rash, facial/tongue/throat swelling, SOB or lightheadedness with hypotension: Yes Has patient had a PCN reaction causing severe rash involving mucus membranes or skin necrosis: No Has patient had a PCN reaction that required hospitalization No Has patient had a PCN reaction occurring within the last 10 years: No If all of the above answers are "NO", then may proceed with Cephalosporin use.  HAS TOLERATED: cephalexin  . Sulfacetamide     REACTION: Unknown to patient. States that MD states allergy per medical records  . Sulfonamide Derivatives Other (See Comments)    REACTION: Unknown to patient. States that MD states allergy per medical records  . Erythromycin Rash  . Statins Itching and Rash  . Vancomycin Itching       Family History  Problem Relation Age of Onset  . Dementia Mother   . Heart disease Father   . Cancer Brother   . Heart disease Brother   . Hyperlipidemia Brother   . Cancer Brother   . Colon cancer Neg Hx      Social History Kenneth [name] reports that he quit smoking about 48 years ago. His smoking use included cigarettes. He started smoking about 65 years ago. He has a 15.00 pack-year smoking history. He quit smokeless tobacco use about 16 years ago.  His smokeless tobacco use included chew. Kenneth [name] reports no history of alcohol use.   Review of Systems CONSTITUTIONAL: No weight loss, fever, chills, weakness or fatigue.  HEENT: Eyes: No visual loss, blurred vision, double vision or yellow sclerae.No hearing loss, sneezing, congestion, runny nose or sore throat.  SKIN: No rash or itching.  CARDIOVASCULAR: per hpi RESPIRATORY: No shortness of breath, cough or sputum.  GASTROINTESTINAL: No anorexia, nausea, vomiting or diarrhea. No abdominal pain or blood.  GENITOURINARY: No burning on urination, no polyuria NEUROLOGICAL: No headache, dizziness, syncope, paralysis, ataxia, numbness or tingling in the extremities. No change in bowel or bladder control.  MUSCULOSKELETAL: No muscle, back pain, joint pain or stiffness.  LYMPHATICS: No enlarged nodes. No history of splenectomy.  PSYCHIATRIC: No history of depression or anxiety.  ENDOCRINOLOGIC: No reports of sweating, cold or heat intolerance. No polyuria or polydipsia.  Marland Kitchen   Physical Examination Today's Vitals   02/15/19 0820  BP: 140/72  Pulse: (!) 53  Temp: (!) 96.9 F (36.1 C)  Weight: 180 lb (81.6 kg)  Height: _0  (1.676 m)   Body mass index is 29.05 kg/m.  Gen: resting comfortably, no acute distress HEENT: no scleral icterus, pupils equal round and reactive, no palptable cervical adenopathy,  CV: RRR, no m/rg, no jvd Resp: Clear to auscultation bilaterally GI: abdomen is soft, non-tender, non-distended,  normal bowel sounds, no hepatosplenomegaly MSK: extremities are warm, no edema.  Skin: warm, no rash Neuro:  no focal deficits Psych: appropriate affect   Diagnostic Studies 07/2011 MPI Tomographic views were obtained using the short axis, vertical long axis, and horizontal long axis planes. No significant, reversible perfusion  defects are noted to indicate ischemia.  Gated imaging reveals an EDV of 59, ESV of 18, T I D ratio of 0.80, and LVEF of 69%.  IMPRESSION: Low risk exercise/Lexiscan Myoview as outlined. No diagnostic ST- segment changes or arrhythmias were noted. There is evidence of soft tissue attenuation, however no frank scar or ischemia. LVEF is normal at 69%.  04/2012 Echo LVEF 65-70%, grade II diastolic dysfunction,   7/82/42 Echo Study Conclusions  - Left ventricle: The cavity size was normal. Wall thickness was increased in a pattern of mild LVH. Systolic function was vigorous. The estimated ejection fraction was in the range of 65% to 70%. Wall motion was normal; there were no regional wall motion abnormalities. Features are consistent with a pseudonormal left ventricular filling pattern, with concomitant abnormal relaxation and increased filling pressure (grade 2 diastolic dysfunction). - Aortic valve: Mildly calcified annulus. Trileaflet. Trivial regurgitation. - Mitral valve: Calcified annulus. Trivial regurgitation. - Left atrium: The atrium was mildly dilated. - Right ventricle: Systolic function was low normal. - Right atrium: Central venous pressure: 5m Hg (est). - Atrial septum: No defect or patent foramen ovale was identified. - Tricuspid valve: Trivial regurgitation. - Pulmonary arteries: Systolic pressure could not be accurately estimated. - Pericardium, extracardiac: There was no pericardial effusion. Impressions:  - Normal LV chamber size with mild LVH and LVEF 635-36% grade 2 diastolic dysfunction. MIld left atrial  enlargement. Mild MAC. Low normal RV contraction. Unable to assess PASP - trivial tricuspid regurgitation.  11/2013 Lexiscan MPI IMPRESSION: 1. Negative Lexiscan MPI for ischemia  2. Normal left ventricular systolic function, LVEF 614% 3. Low risk study for major cardiac events.   09/2016 cath  LM lesion, 50 %stenosed.  Prox LAD to Mid LAD lesion, 70 %stenosed.  Ost 1st Mrg lesion, 100 %stenosed.  RIMA and is normal in caliber and anatomically normal.  RPDA-2 lesion, 50 %stenosed.  RPDA-1 lesion, 60 %stenosed.  LIMA.  The left ventricular systolic function is normal.  LV end diastolic pressure is normal.  Normal LV function without focal segmental wall motion abnormalities.  Multivessel native CAD with 50% ostial stenosis of the LAD and diffuse 70% mid stenoses, occlusion of the OM1 vessel of the circumflex, and diffuse 60 and 50% PDA stenoses in the small caliber PDA vessel.  Patent sequential LIMA graft supplying a twin like diagonal and LAD system system.  Patent RIMA graft supplying the circumflex marginal vessel.  RECOMMENDATION: Medical therapy.   10/2017 echo Study Conclusions  - Left ventricle: The cavity size was normal. Wall thickness was normal. Systolic function was normal. The estimated ejection fraction was in the range of 60% to 65%. Wall motion was normal; there were no regional wall motion abnormalities. Left ventricular diastolic function parameters were normal. - Aortic valve: Mildly calcified annulus. Trileaflet; mildly thickened leaflets. Valve area (VTI): 3.01 cm^2. Valve area (Vmax): 2.24 cm^2. Valve area (Vmean): 2.3 cm^2. - Mitral valve: Mildly calcified annulus. Mildly thickened leaflets . - Left atrium: The atrium was mildly dilated.  12/2017 nuclear stress  No diagnostic ST segment changes to indicate ischemia.  No significant myocardial perfusion defects to indicate scar or ischemia.  This is a  low risk study.  Nuclear stress EF: 67%.    Assessment and Plan  1. CAD  -no recent symptoms, stress test last year without ischemia - continue current meds  2. Hyperlipidemia  - not tolerant of statins, has been on zetia - request labs from pcp  3. HTN  -  at goal based on multiple recent provider visits, though elevated today - continue current meds  4. Carotid stenosis  - continue regular vascular f/u - recs have been for DAPT for his carotid stent  5. Bradycardia - no recent symptoms, continue to monitor   F/u 6 months   Arnoldo Lenis, M.D.

## 2019-02-18 DIAGNOSIS — Z8521 Personal history of malignant neoplasm of larynx: Secondary | ICD-10-CM | POA: Diagnosis not present

## 2019-02-18 DIAGNOSIS — J398 Other specified diseases of upper respiratory tract: Secondary | ICD-10-CM | POA: Diagnosis not present

## 2019-02-18 DIAGNOSIS — Z930 Tracheostomy status: Secondary | ICD-10-CM | POA: Diagnosis not present

## 2019-02-28 DIAGNOSIS — Z430 Encounter for attention to tracheostomy: Secondary | ICD-10-CM | POA: Diagnosis not present

## 2019-02-28 DIAGNOSIS — Z930 Tracheostomy status: Secondary | ICD-10-CM | POA: Diagnosis not present

## 2019-03-21 DIAGNOSIS — Z8521 Personal history of malignant neoplasm of larynx: Secondary | ICD-10-CM | POA: Diagnosis not present

## 2019-03-21 DIAGNOSIS — J398 Other specified diseases of upper respiratory tract: Secondary | ICD-10-CM | POA: Diagnosis not present

## 2019-03-21 DIAGNOSIS — Z930 Tracheostomy status: Secondary | ICD-10-CM | POA: Diagnosis not present

## 2019-03-27 DIAGNOSIS — R69 Illness, unspecified: Secondary | ICD-10-CM | POA: Diagnosis not present

## 2019-04-11 DIAGNOSIS — Z8521 Personal history of malignant neoplasm of larynx: Secondary | ICD-10-CM | POA: Diagnosis not present

## 2019-04-11 DIAGNOSIS — Z930 Tracheostomy status: Secondary | ICD-10-CM | POA: Diagnosis not present

## 2019-04-11 DIAGNOSIS — J398 Other specified diseases of upper respiratory tract: Secondary | ICD-10-CM | POA: Diagnosis not present

## 2019-04-16 DIAGNOSIS — Z923 Personal history of irradiation: Secondary | ICD-10-CM | POA: Diagnosis not present

## 2019-04-16 DIAGNOSIS — Z430 Encounter for attention to tracheostomy: Secondary | ICD-10-CM | POA: Diagnosis not present

## 2019-04-16 DIAGNOSIS — J398 Other specified diseases of upper respiratory tract: Secondary | ICD-10-CM | POA: Diagnosis not present

## 2019-04-16 DIAGNOSIS — Z930 Tracheostomy status: Secondary | ICD-10-CM | POA: Diagnosis not present

## 2019-04-16 DIAGNOSIS — L929 Granulomatous disorder of the skin and subcutaneous tissue, unspecified: Secondary | ICD-10-CM | POA: Diagnosis not present

## 2019-04-16 DIAGNOSIS — Z87891 Personal history of nicotine dependence: Secondary | ICD-10-CM | POA: Diagnosis not present

## 2019-04-19 ENCOUNTER — Encounter (HOSPITAL_COMMUNITY): Payer: Self-pay

## 2019-04-19 ENCOUNTER — Ambulatory Visit (HOSPITAL_COMMUNITY)
Admission: RE | Admit: 2019-04-19 | Discharge: 2019-04-19 | Disposition: A | Discharge status: Home / Self Care | Payer: Medicare HMO | Source: Ambulatory Visit | Attending: Internal Medicine | Admitting: Internal Medicine

## 2019-04-19 ENCOUNTER — Other Ambulatory Visit: Payer: Self-pay

## 2019-04-19 ENCOUNTER — Encounter: Payer: Self-pay | Admitting: Internal Medicine

## 2019-04-19 ENCOUNTER — Ambulatory Visit: Payer: Medicare HMO | Admitting: Internal Medicine

## 2019-04-19 DIAGNOSIS — Z23 Encounter for immunization: Secondary | ICD-10-CM | POA: Diagnosis not present

## 2019-04-19 DIAGNOSIS — R918 Other nonspecific abnormal finding of lung field: Secondary | ICD-10-CM | POA: Insufficient documentation

## 2019-04-19 DIAGNOSIS — R0609 Other forms of dyspnea: Secondary | ICD-10-CM | POA: Diagnosis not present

## 2019-04-19 DIAGNOSIS — Z930 Tracheostomy status: Secondary | ICD-10-CM | POA: Diagnosis not present

## 2019-04-19 DIAGNOSIS — R0600 Dyspnea, unspecified: Secondary | ICD-10-CM

## 2019-04-19 DIAGNOSIS — J849 Interstitial pulmonary disease, unspecified: Secondary | ICD-10-CM | POA: Diagnosis not present

## 2019-04-19 NOTE — Patient Instructions (Signed)
Please remember to go to the  x-ray department  for your tests - we will call you with the results when they are available     Please schedule a follow up visit in 6 months but call sooner if needed  with all medications /inhalers/ solutions in hand so we can verify exactly what you are taking. This includes all medications from all doctors and over the counters

## 2019-04-19 NOTE — Progress Notes (Signed)
Subjective:   Patient ID: Kenneth [name], male    DOB: [birth year],     MRN: [id number]    Brief patient profile:  83  yowm quit smoking 1972 s/p laryngeal Ca RT/surgery and new doe x 2016 referred to pulmonary clinic 12/05/2015 by Dr  Willey Blade with last eval by [name] c/w new polyp and trach placed Aug 2019    History of Present Illness  12/05/2015 1st [facility] office visit/ [facility] Strain   Chief Complaint  Patient presents with  . Advice Only    REferred by Dr. Willey Blade; SOB, no chest tightness, no cough.  Abnormal PFT done 3/1 in [facility].   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 but does fine slow and flat eg  [employer]  rec Change Nexium to 40 mg (= 2 x 20)   Take  30-60 min before first meal of the day and Pepcid (famotidine)  20 mg one @  bedtime until return to see Dr [name] and let him know if this helped GERD diet  There is no evidence of any significant lung disease - follow up here is as needed    03/06/2018 trach done at [facility] and notes say pt could mow grass but pt says could only do riding mower with baseline doe= 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 Pulmonary Consult    Referred by Dr. Willey Blade. Pt c/o SOB since beginning of Feb 2020. He gets SOB walking short distances such as room to room. He has had to suction his trach more often- bloody yellow sputum with occ foul odor.  He is using his albuterol neb 2 x daily.   onset early Feb 2020 fever x one week comes and goes since then seems better just while in hospital  With w/u by ID during last admit to [facility] with temp to 102 on admit. covid neg/sputum grew out pseudomonas 3/25  p last ID note  Documented prompt response to IV meropenem and no need for  further abx. Pseudomonas was sensitive to cipro. 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 but does fine slow and flat   Cough: yellow/ slt bloody  Sleeping: in recliner  better at 45 degrees x feb 2020 whereas prior to Feb able to sleep in flat bed with a couple of pillows SABA use: maybe twice daily / not sure it helps 02: none  rec Omeprazole 20 mg Take 30- 60 min before your first and last meals of the day  Cipro 500 mg twice daily x 2 weeks  Prednisone 10 mg take  4 each am x 2 days,  2 each am x 2 days,  1 each am x 2 days and stop  Keep using  Humidified trach collar as much as possible esp at night with the plug out  For cough > mucinex dm 1200 every 12 hours and supplement with tylenol #3 one every 4 hours if needed For breathing >  Albuterol nebulizer 2.5 mg every 4 hours and take the trach cap off  Please remember to go to  the lab   department   for your tests - we will call you with the results when they are available.  Please schedule a follow up office visit in 2  weeks, sooner if needed  with all medications /inhalers/ solutions in hand so we can verify exactly what you are taking. This includes all medications from all doctors and over the counters    12/04/2018  f/u ov/Ivor Kishi re:  Pulmonary infiltrates / chronic cough - did improve on pred / did not use humidified air to trach collar or max gerd rx or mucinex dm or tyl #3 as directed "can't remember how to take meds"  Chief Complaint  Patient presents with  . 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 but does fine slow and flat  Cough: not following instructions to use mucinex dm or tyl #3 >> minimal mucoid suputum  Sleeping: 45 degrees/ no change since early feb 2020  SABA use: neb does help  02: none  rec I will ask Dr Charm Barges to address the issue with humidified trach collar at bedtime   GERD diet  Prednisone 10 mg take  4 each am x 2 days,   2 each am x 2 days,  1 each am x 2 days and stop  For cough > mucinex dm 1200 every 12 hours and supplement with tylenol #3  One-half  every 4 hours if needed For breathing >  Albuterol nebulizer 2.5 mg every 4 hours and take the trach cap off  Please remember to go to the  x-ray department  for your tests - we will call you with the results when they are available     12/19/2018  f/u ov/Marjie Chea re:  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 Follow-up    Breathing is unchanged. He is coughing more-yellow to clear sputum when he suctions.   Dyspnea:  Walking around the house /legs are limiting Cough: "real dry" (not using humidfier and using guaifensin produce with antihistamines against advice)  Sleeping: 45 elevation/ if not coughs  SABA use  twice daily  Neb avg rec Ok to use nebulizer before heavy exertion to see if helps. Stop the Tussin and use mucinex dm up to 1200 mg every 12 hours as needed  Please schedule a follow up office visit in 4 weeks, sooner if needed with cxr on return    01/16/2019  f/u ov/Ita Fritzsche re: gradually improving doe / swallowing ok  Chief Complaint  Patient presents with  . Follow-up    Breathing has been better. He has noticed some swelling in his feet for a month.    Dyspnea:  MMRC3 = can't walk 100 yards even at a slow pace at a flat grade s stopping due to sob   Cough: dry  Sleeping: 45 degrees recliner/ uses humidified air for trach working well   SABA use: avg bid but no better p neb with ex  02: no rec No change meds    04/19/2019  f/u ov/Lindey Renzulli re:  S/p trach for tracheal stenosis  Chief Complaint  Patient presents with  . Follow-up    Breathing is overall doing well and no new co's. He is using his neb 4 x per wk on average.   Dyspnea:  [employer] walking /  able to walk now  up to half mile to son's house  Cough: none Sleeping: back in bed now  / 6 in bed blocks  SABA use: neb may once a day at most  02: none   No obvious day to day or daytime variability or assoc excess/ purulent sputum or mucus plugs or hemoptysis or cp or chest tightness, subjective wheeze or overt sinus or hb symptoms.   Sleeping as above without nocturnal  or early am exacerbation  of respiratory  c/o's or need for noct saba. Also denies any obvious fluctuation of symptoms with weather or environmental changes or other aggravating or alleviating factors except as outlined above   No unusual exposure hx or h/o childhood pna/ asthma or knowledge of premature birth.  Current Allergies, Complete Past Medical History, Past Surgical History, Family History, and Social History were reviewed in [employer] record.  ROS  The following are not active complaints unless bolded Hoarseness, sore throat, dysphagia, dental problems, itching, sneezing,  nasal congestion or discharge of excess mucus or purulent secretions, ear ache,   fever, chills, sweats, unintended wt loss or wt gain, classically pleuritic or exertional cp,  orthopnea pnd or arm/hand swelling  or leg swelling, presyncope, palpitations, abdominal pain, anorexia, nausea, vomiting, diarrhea  or change in bowel habits or change in bladder habits, change in stools or change in urine, dysuria, hematuria,  rash, arthralgias, visual complaints, headache, numbness, weakness or ataxia or problems with walking or coordination,  change in mood or  memory.        Current Meds  Medication Sig  . acetaminophen (TYLENOL) 500 MG tablet Take 500 mg by mouth every 6 (six) hours as needed.  Marland Kitchen acetaminophen-codeine (TYLENOL #3) 300-30 MG tablet Take 1 tablet by mouth every 4 (four) hours as needed for moderate pain.  Marland Kitchen albuterol (PROVENTIL) (2.5 MG/3ML) 0.083% nebulizer solution Take 3 mLs (2.5 mg total) by nebulization 3 (three) times daily. X 5 days and then q 6 hrs prn after that  . amLODipine (NORVASC) 10  MG tablet Take 1 tablet by mouth once daily  . aspirin 325 MG tablet Take 1 tablet (325 mg total) by mouth daily.  . clopidogrel (PLAVIX) 75 MG tablet Take 1 tablet by mouth once daily  . Dextromethorphan-guaiFENesin (TUSSIN DM MAX PO) Take by mouth. As directed as needed  . ezetimibe (ZETIA) 10 MG tablet Take 1 tablet (10 mg total) by mouth daily.  Marland Kitchen guaifenesin (HUMIBID E) 400 MG TABS tablet Take 400 mg by mouth every 4 (four) hours as needed.  . metoprolol tartrate (LOPRESSOR) 25 MG tablet Take 25 mg by mouth 2 (two) times daily.   . nitroGLYCERIN (NITROSTAT) 0.4 MG SL tablet Place 1 tablet (0.4 mg total) under the tongue every 5 (five) minutes as needed for chest pain.  Marland Kitchen omeprazole (PRILOSEC) 20 MG capsule Take 30- 60 min before your first and last meals of the day  . PARoxetine (PAXIL) 20 MG tablet Take 20 mg by mouth at bedtime.   Marland Kitchen spironolactone (ALDACTONE) 25 MG tablet Take 25 mg by mouth every morning.                 Objective:   Physical Exam   04/19/2019        178  01/16/2019        179  12/19/2018       178 12/04/2018        176   11/10/2018  172  12/05/15 182 lb 9.6 oz (82.827 kg)  08/07/15 182 lb (82.555 kg)  07/18/15 176 lb (79.833 kg)    Very oarse wm speaks by putting finger over trach / good cough mechanics demonstrated same way     Reports top dentures HEENT : pt wearing mask not removed for exam due to covid - 19 concerns.   NECK :  without JVD/Nodes/TM/ nl carotid upstrokes bilaterally   LUNGS: no acc muscle use,  Mild barrel  contour chest wall with bilateral  Distant bs s audible wheeze and  without cough on insp or exp maneuvers  and mild  Hyperresonant  to  percussion bilaterally     CV:  RRR  no s3 or murmur or increase in P2, and no edema   ABD:  soft and nontender with pos end  insp Hoover's  in the supine position. No bruits or organomegaly appreciated, bowel sounds nl  MS:   Nl gait/  ext warm without deformities, calf tenderness,  cyanosis or clubbing No obvious joint restrictions   SKIN: warm and dry without lesions    NEURO:  alert, approp, nl sensorium with  no motor or cerebellar deficits apparent.           CXR PA and Lateral:   04/19/2019 :    I personally reviewed images and agree with radiology impression as follows:     1. No radiographic evidence of acute cardiopulmonary disease. 2. 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 3. Aortic atherosclerosis. 4. Postoperative changes and support apparatus, as above.    Assessment & Plan:

## 2019-04-19 NOTE — Progress Notes (Signed)
LMTCB

## 2019-04-20 ENCOUNTER — Telehealth: Payer: Self-pay | Admitting: Internal Medicine

## 2019-04-20 ENCOUNTER — Encounter: Payer: Self-pay | Admitting: Internal Medicine

## 2019-04-20 NOTE — Assessment & Plan Note (Signed)
Quit smoking 1972  PFT's  09/24/15   FEV1 2.08(82 % ) ratio 81  p 10 % improvement from saba p no rx prior to study with DLCO  58 % correct to 61 % for alv volume   - 12/05/2015  Walked RA x 3 laps @ 185 ft each stopped due to  End of study, nl pace, no desat,  Mild sob - Spirometry 12/05/2015  FEV1 1.78 (64%)  Ratio 74  s truncation - 12/04/2018   Walked RA  2 laps @  approx 281ft each @ mod fast pace  stopped due to  End of study, second lap s trach plug helped doe and allowed faster walk   - 12/19/2018   [facility] RA x one lap =  approx 250 ft - stopped due to  Legs gave out with sats still 98% at moderate pace   Able to walk so son's house a half mile from home = good yardstick to use / monitor sats with ex and f/u prn or in 6 m, whichever comes first.  Each maintenance medication was reviewed in detail including most importantly the difference between maintenance and as needed and under what circumstances the prns are to be used.  Please see AVS for specific  Instructions which are unique to this visit and I personally typed out  which were reviewed in detail in writing with the patient and a copy provided.

## 2019-04-20 NOTE — Assessment & Plan Note (Signed)
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

## 2019-04-20 NOTE — Assessment & Plan Note (Signed)
Onset of recurrent fever early feb 2020 CTa chest 10/13/18 1. No evidence of acute pulmonary embolus. 2. No improvement in abnormal bilateral lung opacity since the CT on 09/25/2018, and right upper lobe involvement appears progressed. 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

## 2019-04-20 NOTE — Telephone Encounter (Signed)
Called and spoke w/ pt spouse, Andee Poles (on [place]) regarding pt's CXR results from 04/19/2019. Pt spouse verbalized understanding with no additional questions. Result note has been updated to reflect this. Nothing further needed at this time.

## 2019-04-23 DIAGNOSIS — Z930 Tracheostomy status: Secondary | ICD-10-CM | POA: Diagnosis not present

## 2019-04-23 DIAGNOSIS — Z430 Encounter for attention to tracheostomy: Secondary | ICD-10-CM | POA: Diagnosis not present

## 2019-05-02 ENCOUNTER — Telehealth: Payer: Self-pay | Admitting: Cardiology

## 2019-05-02 MED ORDER — AMLODIPINE BESYLATE 10 MG PO TABS: 10.0000 mg | ORAL_TABLET | Freq: Every day | ORAL | 3 refills | Status: DC

## 2019-05-02 NOTE — Telephone Encounter (Signed)
Refill complete 

## 2019-05-02 NOTE — Telephone Encounter (Signed)
Per phone call from pt-- he accidentally threw awat his  amLODipine (NORVASC) 10 MG tablet [id number]   Please send in new Rx to [facility]

## 2019-05-27 DIAGNOSIS — R69 Illness, unspecified: Secondary | ICD-10-CM | POA: Diagnosis not present

## 2019-07-05 DIAGNOSIS — H524 Presbyopia: Secondary | ICD-10-CM | POA: Diagnosis not present

## 2019-07-05 DIAGNOSIS — H353131 Nonexudative age-related macular degeneration, bilateral, early dry stage: Secondary | ICD-10-CM | POA: Diagnosis not present

## 2019-07-18 DIAGNOSIS — R69 Illness, unspecified: Secondary | ICD-10-CM | POA: Diagnosis not present

## 2019-07-24 DIAGNOSIS — I10 Essential (primary) hypertension: Secondary | ICD-10-CM | POA: Diagnosis not present

## 2019-07-24 DIAGNOSIS — I6529 Occlusion and stenosis of unspecified carotid artery: Secondary | ICD-10-CM | POA: Diagnosis not present

## 2019-07-24 DIAGNOSIS — T466X5A Adverse effect of antihyperlipidemic and antiarteriosclerotic drugs, initial encounter: Secondary | ICD-10-CM | POA: Diagnosis not present

## 2019-07-24 DIAGNOSIS — I2510 Atherosclerotic heart disease of native coronary artery without angina pectoris: Secondary | ICD-10-CM | POA: Diagnosis not present

## 2019-07-24 DIAGNOSIS — N183 Chronic kidney disease, stage 3 unspecified: Secondary | ICD-10-CM | POA: Diagnosis not present

## 2019-07-25 DIAGNOSIS — E785 Hyperlipidemia, unspecified: Secondary | ICD-10-CM | POA: Diagnosis not present

## 2019-07-25 DIAGNOSIS — J386 Stenosis of larynx: Secondary | ICD-10-CM | POA: Diagnosis not present

## 2019-07-25 DIAGNOSIS — I2510 Atherosclerotic heart disease of native coronary artery without angina pectoris: Secondary | ICD-10-CM | POA: Diagnosis not present

## 2019-07-25 DIAGNOSIS — E875 Hyperkalemia: Secondary | ICD-10-CM | POA: Diagnosis not present

## 2019-07-25 DIAGNOSIS — N183 Chronic kidney disease, stage 3 unspecified: Secondary | ICD-10-CM | POA: Diagnosis not present

## 2019-07-25 DIAGNOSIS — K219 Gastro-esophageal reflux disease without esophagitis: Secondary | ICD-10-CM | POA: Diagnosis not present

## 2019-07-25 DIAGNOSIS — I700 Atherosclerosis of aorta: Secondary | ICD-10-CM | POA: Diagnosis not present

## 2019-07-25 DIAGNOSIS — R69 Illness, unspecified: Secondary | ICD-10-CM | POA: Diagnosis not present

## 2019-07-25 DIAGNOSIS — R7303 Prediabetes: Secondary | ICD-10-CM | POA: Diagnosis not present

## 2019-07-25 DIAGNOSIS — I10 Essential (primary) hypertension: Secondary | ICD-10-CM | POA: Diagnosis not present

## 2019-08-09 ENCOUNTER — Encounter: Payer: Self-pay | Admitting: Cardiology

## 2019-08-09 ENCOUNTER — Ambulatory Visit: Payer: Medicare HMO | Admitting: Cardiology

## 2019-08-09 VITALS — BP 148/75 | HR 63 | Temp 97.3°F | Ht 66.0 in | Wt 184.0 lb

## 2019-08-09 DIAGNOSIS — E782 Mixed hyperlipidemia: Secondary | ICD-10-CM | POA: Diagnosis not present

## 2019-08-09 DIAGNOSIS — I10 Essential (primary) hypertension: Secondary | ICD-10-CM

## 2019-08-09 DIAGNOSIS — I6523 Occlusion and stenosis of bilateral carotid arteries: Secondary | ICD-10-CM

## 2019-08-09 DIAGNOSIS — I2510 Atherosclerotic heart disease of native coronary artery without angina pectoris: Secondary | ICD-10-CM | POA: Diagnosis not present

## 2019-08-09 NOTE — Progress Notes (Signed)
Clinical Summary Kenneth [name] is a 79 y.o.male seen today for follow up of the following medical problems.   1. CAD  - prior CABG in 2003, repeat cath 2007 with patent grafts.  - 07/2011 MPI low risk study, no ischemia.  - echo 04/2012 LVEF 65-60%, grade II diastolic dysfunction  - 02/3150 echo showed LVEF 65-70%, grade II diastolic dysfunction.  - 11/2013 MPI no ischemia. - 10/2017 echo LVEF 60-65%, no WMAs, normal diastolic function   - 01/6159 nuclear stress: no ischemia   - no recent chest pain - chronic SOB unchanged - compliant with meds    2. Hyperlipidemia  - reported history of rash on statin, he reports tried multiple and all caused rash - he is on zetia 06/2019 TC 184 TG 293 HDL 35 LDL 99   3. HTN  -compliant with med  4. Carotid stenosis  - prior carotid stenting  - followed by vascular, recs have been for DAPT  5. SOB - remote 10-15 year history of smoking. Used to work in [place], mainly with walking up incline.  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  - followed by pulm Notes mention CT imaging consistent with slowly resolving ALI, lung imaging has shown consistent interstitial findings.     6. Throat cancer s/p radiation and surgery - followed at [facility]  - has tracheostomy  7. CVA - admit 10/2016 with small cortical based infarcts in right parietal lobe, associated hemorrhage - right ICA 50-69% 10/2017   Past Medical History:  Diagnosis Date  . Anxiety   . 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 ASCVD (arteriosclerotic cardiovascular disease)   . Chronic lower back pain    "have it at night" (05/04/2018)  . Coronary artery disease    a. 2003: s/p CABG x3V  b. 2007: cath with patent bypass grafts.  c. 09/2016: [place] with cath showing patent bypass  grafts, medical Rx recommended  . CVA (cerebral vascular accident) ([place]) 10/2017   "little numb on my left face since" (05/04/2018)  . Depression   . GERD (gastroesophageal reflux disease)   . High triglycerides   . History of kidney stones   . Hypertension   . 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 Malodorous urine    "in the last 5wks; since I had trach put in" (05/04/2018)  . Peripheral vascular disease ([place])   . Stroke [facility])    "he's had several little strokes; many that he wasn't aware of" (05/04/2018)  . Tobacco abuse   . Tracheal stenosis      Allergies  Allergen Reactions  . Nifedipine Other (See Comments)  . Lorazepam Other (See Comments)    REACTION: Alters mental status. "Turns into maniac" REACTION: Alters mental status. "Turns into maniac" REACTION: Alters mental status. "Turns into maniac"  . Other     REACTION: Unknown to patient. States that MD states allergy per medical records  . Penicillins Hives    Has patient had a PCN reaction causing immediate rash, facial/tongue/throat swelling, SOB or lightheadedness with hypotension: Yes Has patient had a PCN reaction causing severe rash involving mucus membranes or skin necrosis: No Has patient had a PCN reaction that required hospitalization No Has patient had a PCN reaction occurring within the last 10 years: No If all of the above answers are "NO",  then may proceed with Cephalosporin use.  HAS TOLERATED: cephalexin  . Sulfacetamide     REACTION: Unknown to patient. States that MD states allergy per medical records  . Sulfonamide Derivatives Other (See Comments)    REACTION: Unknown to patient. States that MD states allergy per medical records  . Erythromycin Rash  . Statins Itching and Rash  . Vancomycin Itching     Current Outpatient Medications  Medication Sig Dispense Refill  . acetaminophen (TYLENOL) 500 MG tablet Take 500 mg by mouth every 6 (six) hours as needed.    Marland Kitchen acetaminophen-codeine (TYLENOL #3)  300-30 MG tablet Take 1 tablet by mouth every 4 (four) hours as needed for moderate pain.    Marland Kitchen albuterol (PROVENTIL) (2.5 MG/3ML) 0.083% nebulizer solution Take 3 mLs (2.5 mg total) by nebulization 3 (three) times daily. X 5 days and then q 6 hrs prn after that 75 mL 12  . amLODipine (NORVASC) 10 MG tablet Take 1 tablet (10 mg total) by mouth daily. 90 tablet 3  . aspirin 325 MG tablet Take 1 tablet (325 mg total) by mouth daily.    . clopidogrel (PLAVIX) 75 MG tablet Take 1 tablet by mouth once daily 90 tablet 3  . Dextromethorphan-guaiFENesin (TUSSIN DM MAX PO) Take by mouth. As directed as needed    . ezetimibe (ZETIA) 10 MG tablet Take 1 tablet (10 mg total) by mouth daily. 90 tablet 3  . guaifenesin (HUMIBID E) 400 MG TABS tablet Take 400 mg by mouth every 4 (four) hours as needed.    . metoprolol tartrate (LOPRESSOR) 25 MG tablet Take 25 mg by mouth 2 (two) times daily.     . nitroGLYCERIN (NITROSTAT) 0.4 MG SL tablet Place 1 tablet (0.4 mg total) under the tongue every 5 (five) minutes as needed for chest pain. 25 tablet 3  . omeprazole (PRILOSEC) 20 MG capsule Take 30- 60 min before your first and last meals of the day 60 capsule 11  . PARoxetine (PAXIL) 20 MG tablet Take 20 mg by mouth at bedtime.     Marland Kitchen spironolactone (ALDACTONE) 25 MG tablet Take 25 mg by mouth every morning.      No current facility-administered medications for this visit.     Past Surgical History:  Procedure Laterality Date  . CAROTID STENT Right 06-13-12; 05/04/2018  . CAROTID STENT INSERTION N/A 06/13/2012   Procedure: CAROTID STENT INSERTION;  Surgeon: Serafina Mitchell, MD;  Location: [facility] CATH LAB;  Service: Cardiovascular;  Laterality: N/A;  . CATARACT EXTRACTION, BILATERAL Bilateral   . COLONOSCOPY  11/10/2011   Dr. [name]: hemorrhoids, tubular adenoma  . CORNEAL TRANSPLANT Bilateral   . CORONARY ARTERY BYPASS GRAFT  ~ 2003   "CABG X3"  . EYE SURGERY    . INSERTION OF RETROGRADE CAROTID STENT Right  05/04/2018   Procedure: INSERTION OF RIGHT  CAROTID STENT using an ABBOT- XACT carotid stent system;  Surgeon: Serafina Mitchell, MD;  Location: [facility];  Service: Vascular;  Laterality: Right;  . LAPAROSCOPIC CHOLECYSTECTOMY  2007  . LEFT HEART CATH AND CORS/GRAFTS ANGIOGRAPHY N/A 10/07/2016   Procedure: Left Heart Cath and Cors/Grafts Angiography;  Surgeon: Troy Sine, MD;  Location: [place] CV LAB;  Service: Cardiovascular;  Laterality: N/A;  . PARTIAL LARYNGECTOMY  1998  . TRACHEOSTOMY  03/13/2018   "@ [facility]"     Allergies  Allergen Reactions  . Nifedipine Other (See Comments)  . Lorazepam Other (See Comments)    REACTION: Alters mental  status. "Turns into maniac" REACTION: Alters mental status. "Turns into maniac" REACTION: Alters mental status. "Turns into maniac"  . Other     REACTION: Unknown to patient. States that MD states allergy per medical records  . Penicillins Hives    Has patient had a PCN reaction causing immediate rash, facial/tongue/throat swelling, SOB or lightheadedness with hypotension: Yes Has patient had a PCN reaction causing severe rash involving mucus membranes or skin necrosis: No Has patient had a PCN reaction that required hospitalization No Has patient had a PCN reaction occurring within the last 10 years: No If all of the above answers are "NO", then may proceed with Cephalosporin use.  HAS TOLERATED: cephalexin  . Sulfacetamide     REACTION: Unknown to patient. States that MD states allergy per medical records  . Sulfonamide Derivatives Other (See Comments)    REACTION: Unknown to patient. States that MD states allergy per medical records  . Erythromycin Rash  . Statins Itching and Rash  . Vancomycin Itching      Family History  Problem Relation Age of Onset  . Dementia Mother   . Heart disease Father   . Cancer Brother   . Heart disease Brother   . Hyperlipidemia Brother   . Cancer Brother   . Colon cancer Neg Hx      Social  History Mr. Collard reports that he quit smoking about 49 years ago. His smoking use included cigarettes. He started smoking about 66 years ago. He has a 15.00 pack-year smoking history. He quit smokeless tobacco use about 17 years ago.  His smokeless tobacco use included chew. Mr. Hellberg reports no history of alcohol use.   Review of Systems CONSTITUTIONAL: No weight loss, fever, chills, weakness or fatigue.  HEENT: Eyes: No visual loss, blurred vision, double vision or yellow sclerae.No hearing loss, sneezing, congestion, runny nose or sore throat.  SKIN: No rash or itching.  CARDIOVASCULAR: per hpi RESPIRATORY: No shortness of breath, cough or sputum.  GASTROINTESTINAL: No anorexia, nausea, vomiting or diarrhea. No abdominal pain or blood.  GENITOURINARY: No burning on urination, no polyuria NEUROLOGICAL: No headache, dizziness, syncope, paralysis, ataxia, numbness or tingling in the extremities. No change in bowel or bladder control.  MUSCULOSKELETAL: No muscle, back pain, joint pain or stiffness.  LYMPHATICS: No enlarged nodes. No history of splenectomy.  PSYCHIATRIC: No history of depression or anxiety.  ENDOCRINOLOGIC: No reports of sweating, cold or heat intolerance. No polyuria or polydipsia.  Marland Kitchen   Physical Examination Today's Vitals   08/09/19 1257  BP: (!) 148/75  Pulse: 63  Temp: (!) 97.3 F (36.3 C)  SpO2: 96%  Weight: 184 lb (83.5 kg)  Height: _0  (1.676 m)   Body mass index is 29.7 kg/m.  Gen: resting comfortably, no acute distress HEENT: no scleral icterus, pupils equal round and reactive, no palptable cervical adenopathy,  CV: RRR, no m/r/g, no jvd Resp: Clear to auscultation bilaterally GI: abdomen is soft, non-tender, non-distended, normal bowel sounds, no hepatosplenomegaly MSK: extremities are warm, no edema.  Skin: warm, no rash Neuro:  no focal deficits Psych: appropriate affect   Diagnostic Studies 07/2011 MPI Tomographic views were  obtained using the short axis, vertical long axis, and horizontal long axis planes. No significant, reversible perfusion defects are noted to indicate ischemia.  Gated imaging reveals an EDV of 59, ESV of 18, T I D ratio of 0.80, and LVEF of 69%.  IMPRESSION: Low risk exercise/Lexiscan Myoview as outlined. No diagnostic ST- segment changes  or arrhythmias were noted. There is evidence of soft tissue attenuation, however no frank scar or ischemia. LVEF is normal at 69%.  04/2012 Echo LVEF 65-70%, grade II diastolic dysfunction,   7/98/92 Echo Study Conclusions  - Left ventricle: The cavity size was normal. Wall thickness was increased in a pattern of mild LVH. Systolic function was vigorous. The estimated ejection fraction was in the range of 65% to 70%. Wall motion was normal; there were no regional wall motion abnormalities. Features are consistent with a pseudonormal left ventricular filling pattern, with concomitant abnormal relaxation and increased filling pressure (grade 2 diastolic dysfunction). - Aortic valve: Mildly calcified annulus. Trileaflet. Trivial regurgitation. - Mitral valve: Calcified annulus. Trivial regurgitation. - Left atrium: The atrium was mildly dilated. - Right ventricle: Systolic function was low normal. - Right atrium: Central venous pressure: 11m Hg (est). - Atrial septum: No defect or patent foramen ovale was identified. - Tricuspid valve: Trivial regurgitation. - Pulmonary arteries: Systolic pressure could not be accurately estimated. - Pericardium, extracardiac: There was no pericardial effusion. Impressions:  - Normal LV chamber size with mild LVH and LVEF 611-94% grade 2 diastolic dysfunction. MIld left atrial enlargement. Mild MAC. Low normal RV contraction. Unable to assess PASP - trivial tricuspid regurgitation.  11/2013 Lexiscan MPI IMPRESSION: 1. Negative Lexiscan MPI for ischemia  2. Normal left ventricular systolic  function, LVEF 617% 3. Low risk study for major cardiac events.   09/2016 cath  LM lesion, 50 %stenosed.  Prox LAD to Mid LAD lesion, 70 %stenosed.  Ost 1st Mrg lesion, 100 %stenosed.  RIMA and is normal in caliber and anatomically normal.  RPDA-2 lesion, 50 %stenosed.  RPDA-1 lesion, 60 %stenosed.  LIMA.  The left ventricular systolic function is normal.  LV end diastolic pressure is normal.  Normal LV function without focal segmental wall motion abnormalities.  Multivessel native CAD with 50% ostial stenosis of the LAD and diffuse 70% mid stenoses, occlusion of the OM1 vessel of the circumflex, and diffuse 60 and 50% PDA stenoses in the small caliber PDA vessel.  Patent sequential LIMA graft supplying a twin like diagonal and LAD system system.  Patent RIMA graft supplying the circumflex marginal vessel.  RECOMMENDATION: Medical therapy.   10/2017 echo Study Conclusions  - Left ventricle: The cavity size was normal. Wall thickness was normal. Systolic function was normal. The estimated ejection fraction was in the range of 60% to 65%. Wall motion was normal; there were no regional wall motion abnormalities. Left ventricular diastolic function parameters were normal. - Aortic valve: Mildly calcified annulus. Trileaflet; mildly thickened leaflets. Valve area (VTI): 3.01 cm^2. Valve area (Vmax): 2.24 cm^2. Valve area (Vmean): 2.3 cm^2. - Mitral valve: Mildly calcified annulus. Mildly thickened leaflets . - Left atrium: The atrium was mildly dilated.  12/2017 nuclear stress  No diagnostic ST segment changes to indicate ischemia.  No significant myocardial perfusion defects to indicate scar or ischemia.  This is a low risk study.  Nuclear stress EF: 67%.    Assessment and Plan  1. CAD  -no recent symptoms, continue current meds. DAPT regimen more related to prior stroke and carotid disease as opposed to cardiac indications.    2. Hyperlipidemia  - not tolerant of statins,has been on zetia - LDL of 99, with extensive vascular disease would have a goal of <70. Refer to lipid clinic to consider pcsk9 inhibitors  3. HTN  - manual recheck 130/70, continue currentm eds  4. Carotid stenosis  - continue regular vascular  f/u - recs have been for DAPT for his carotid stent - continue medical therapy     F/u 6 months      Arnoldo Lenis, M.D.

## 2019-08-09 NOTE — Patient Instructions (Signed)
Medication Instructions:  Your physician recommends that you continue on your current medications as directed. Please refer to the Current Medication list given to you today.  *If you need a refill on your cardiac medications before your next appointment, please call your pharmacy*  Lab Work: NONE   If you have labs (blood work) drawn today and your tests are completely normal, you will receive your results only by: Marland Kitchen MyChart Message (if you have MyChart) OR . A paper copy in the mail If you have any lab test that is abnormal or we need to change your treatment, we will call you to review the results.  Testing/Procedures: NONE   Follow-Up: At [facility], you and your health needs are our priority.  As part of our continuing mission to provide you with exceptional heart care, we have created designated Provider Care Teams.  These Care Teams include your primary Cardiologist (physician) and Advanced Practice Providers (APPs -  Physician Assistants and Nurse Practitioners) who all work together to provide you with the care you need, when you need it.  Your next appointment:   6 month(s)  The format for your next appointment:   Virtual Visit   Provider:   Carlyle Dolly, MD  Other Instructions Thank you for choosing [facility]!

## 2019-08-15 DIAGNOSIS — R69 Illness, unspecified: Secondary | ICD-10-CM | POA: Diagnosis not present

## 2019-09-11 DIAGNOSIS — K219 Gastro-esophageal reflux disease without esophagitis: Secondary | ICD-10-CM | POA: Diagnosis not present

## 2019-09-11 DIAGNOSIS — R69 Illness, unspecified: Secondary | ICD-10-CM | POA: Diagnosis not present

## 2019-09-11 DIAGNOSIS — I2510 Atherosclerotic heart disease of native coronary artery without angina pectoris: Secondary | ICD-10-CM | POA: Diagnosis not present

## 2019-09-11 DIAGNOSIS — I129 Hypertensive chronic kidney disease with stage 1 through stage 4 chronic kidney disease, or unspecified chronic kidney disease: Secondary | ICD-10-CM | POA: Diagnosis not present

## 2019-09-11 DIAGNOSIS — H269 Unspecified cataract: Secondary | ICD-10-CM | POA: Diagnosis not present

## 2019-09-11 DIAGNOSIS — G8929 Other chronic pain: Secondary | ICD-10-CM | POA: Diagnosis not present

## 2019-09-11 DIAGNOSIS — I739 Peripheral vascular disease, unspecified: Secondary | ICD-10-CM | POA: Diagnosis not present

## 2019-09-11 DIAGNOSIS — I499 Cardiac arrhythmia, unspecified: Secondary | ICD-10-CM | POA: Diagnosis not present

## 2019-09-11 DIAGNOSIS — Z930 Tracheostomy status: Secondary | ICD-10-CM | POA: Diagnosis not present

## 2019-09-28 DIAGNOSIS — K1120 Sialoadenitis, unspecified: Secondary | ICD-10-CM | POA: Diagnosis not present

## 2019-10-12 ENCOUNTER — Encounter: Payer: Self-pay | Admitting: Internal Medicine

## 2019-10-12 ENCOUNTER — Ambulatory Visit (INDEPENDENT_AMBULATORY_CARE_PROVIDER_SITE_OTHER): Payer: Medicare HMO

## 2019-10-12 ENCOUNTER — Ambulatory Visit: Payer: Medicare HMO | Admitting: Internal Medicine

## 2019-10-12 ENCOUNTER — Other Ambulatory Visit: Payer: Self-pay

## 2019-10-12 DIAGNOSIS — R918 Other nonspecific abnormal finding of lung field: Secondary | ICD-10-CM | POA: Diagnosis not present

## 2019-10-12 DIAGNOSIS — Z930 Tracheostomy status: Secondary | ICD-10-CM

## 2019-10-12 DIAGNOSIS — R0609 Other forms of dyspnea: Secondary | ICD-10-CM

## 2019-10-12 DIAGNOSIS — R05 Cough: Secondary | ICD-10-CM | POA: Diagnosis not present

## 2019-10-12 DIAGNOSIS — R0600 Dyspnea, unspecified: Secondary | ICD-10-CM

## 2019-10-12 NOTE — Progress Notes (Signed)
Subjective:   Patient ID: Kenneth [name], male    DOB: Nov 11, 1940,     MRN: [id number]    Brief patient profile:  37 yowm quit smoking 1972 s/p laryngeal Ca RT/surgery and new doe x 2016 referred to pulmonary clinic 12/05/2015 by Dr  Willey Blade with last eval by [name] c/w new polyp and trach placed Aug 2019    History of Present Illness  12/05/2015 1st [facility] office visit/ Teea Ducey   Chief Complaint  Patient presents with  . Advice Only    REferred by Dr. Willey Blade; SOB, no chest tightness, no cough.  Abnormal PFT done 3/1 in [facility].   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 but does fine slow and flat eg  [employer]  rec Change Nexium to 40 mg (= 2 x 20)   Take  30-60 min before first meal of the day and Pepcid (famotidine)  20 mg one @  bedtime until return to see Dr [name] and let him know if this helped GERD diet  There is no evidence of any significant lung disease - follow up here is as needed    03/06/2018 trach done at [facility] and notes say pt could mow grass but pt says could only do riding mower with baseline doe= 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 Pulmonary Consult    Referred by Dr. Willey Blade. Pt c/o SOB since beginning of Feb 2020. He gets SOB walking short distances such as room to room. He has had to suction his trach more often- bloody yellow sputum with occ foul odor.  He is using his albuterol neb 2 x daily.   onset early Feb 2020 fever x one week comes and goes since then seems better just while in hospital  With w/u by ID during last admit to [facility] with temp to 102 on admit. covid neg/sputum grew out pseudomonas 3/25  p last ID note  Documented prompt response to IV meropenem and no need for  further abx. Pseudomonas was sensitive to cipro. 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 but does fine slow and flat   Cough: yellow/ slt bloody  Sleeping: in recliner  better at 45 degrees x feb 2020 whereas prior to Feb able to sleep in flat bed with a couple of pillows SABA use: maybe twice daily / not sure it helps 02: none  rec Omeprazole 20 mg Take 30- 60 min before your first and last meals of the day  Cipro 500 mg twice daily x 2 weeks  Prednisone 10 mg take  4 each am x 2 days,  2 each am x 2 days,  1 each am x 2 days and stop  Keep using  Humidified trach collar as much as possible esp at night with the plug out  For cough > mucinex dm 1200 every 12 hours and supplement with tylenol #3 one every 4 hours if needed For breathing >  Albuterol nebulizer 2.5 mg every 4 hours and take the trach cap off  Please remember to go to the  lab   department   for your tests - we will call you with the results when they are available.  Please schedule a follow up office visit in 2  weeks, sooner if needed  with all medications /inhalers/ solutions in hand so we can verify exactly what you are taking. This includes all medications from all doctors and over the counters    12/04/2018  f/u ov/Cayla Wiegand re:  Pulmonary infiltrates / chronic cough - did improve on pred / did not use humidified air to trach collar or max gerd rx or mucinex dm or tyl #3 as directed "can't remember how to take meds"  Chief Complaint  Patient presents with  . 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 but does fine slow and flat  Cough: not following instructions to use mucinex dm or tyl #3 >> minimal mucoid suputum  Sleeping: 45 degrees/ no change since early feb 2020  SABA use: neb does help  02: none  rec I will ask Dr Charm Barges to address the issue with humidified trach collar at bedtime    GERD diet  Prednisone 10 mg take  4 each am x 2 days,   2 each am x 2 days,  1 each am x 2 days and stop  For cough > mucinex dm 1200 every 12 hours and supplement with tylenol #3  One-half  every 4 hours if needed For breathing >  Albuterol nebulizer 2.5 mg every 4 hours and take the trach cap off  Please remember to go to the  x-ray department  for your tests - we will call you with the results when they are available     12/19/2018  f/u ov/Emillee Talsma re:  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 Follow-up    Breathing is unchanged. He is coughing more-yellow to clear sputum when he suctions.   Dyspnea:  Walking around the house /legs are limiting Cough: "real dry" (not using humidfier and using guaifensin produce with antihistamines against advice)  Sleeping: 45 elevation/ if not coughs  SABA use  twice daily  Neb avg rec Ok to use nebulizer before heavy exertion to see if helps. Stop the Tussin and use mucinex dm up to 1200 mg every 12 hours as needed  Please schedule a follow up office visit in 4 weeks, sooner if needed with cxr on return    01/16/2019  f/u ov/Zaylee Cornia re: gradually improving doe / swallowing ok  Chief Complaint  Patient presents with  . Follow-up    Breathing has been better. He has noticed some swelling in his feet for a month.    Dyspnea:  MMRC3 = can't walk 100 yards even at a slow pace at a flat grade s stopping due to sob   Cough: dry  Sleeping: 45 degrees recliner/ uses humidified air for trach working well   SABA use: avg bid but no better p neb with ex  02: no rec No change meds    04/19/2019  f/u ov/Kalany Diekmann re:  S/p trach for tracheal stenosis  Chief Complaint  Patient presents with  . Follow-up    Breathing is overall doing well and no new co's. He is using his neb 4 x per wk on average.   Dyspnea:  [employer] walking /  able to walk now  up to half mile to son's house  Cough: none Sleeping: back in bed now  / 6 in bed blocks  SABA use: neb may once a day at most  02: none rec Please remember to go to the  x-ray department  for your tests - we will call you with the results when they are available   Please schedule a follow up visit in 6 months but call sooner if needed  with all medications /inhalers/ solutions in hand so we can verify exactly what you are taking. This includes all medications from all doctors and over the counters   10/12/2019  f/u ov/Ulis Kaps re: [name]  Chief Complaint  Patient presents with  . Follow-up    Breathing is unchanged since the last visit.    Dyspnea:  Limited by hips > sob but goes slow / [employer] ok but not WM Cough: spells/ mostly dry hack  Sleeping: having to sleep in recliner due to cough  - 45 degrees  SABA use: using neb tiw  02: none    No obvious day to day or daytime variability or assoc excess/ purulent sputum or mucus plugs or hemoptysis or cp or chest tightness, subjective wheeze or overt sinus or hb symptoms.     Also denies any obvious fluctuation of symptoms with weather or environmental changes or other aggravating or alleviating factors except as outlined above   No unusual exposure hx or h/o childhood pna/ asthma or knowledge of premature birth.  Current Allergies, Complete Past Medical History, Past Surgical History, Family History, and Social History were reviewed in [employer] record.  ROS  The following are not active complaints unless bolded Hoarseness, sore throat, dysphagia, dental problems, itching, sneezing,  nasal congestion or discharge of excess mucus or purulent secretions, ear ache,   fever, chills, sweats, unintended wt loss or wt gain, classically pleuritic or exertional cp,  orthopnea pnd or arm/hand swelling  or leg swelling, presyncope, palpitations, abdominal pain, anorexia, nausea, vomiting, diarrhea  or change in bowel habits or change in bladder habits, change in stools or change  in urine, dysuria, hematuria,  rash, arthralgias, visual complaints, headache, numbness, weakness or ataxia or problems with walking or coordination,  change in mood or  memory.        Current Meds  Medication Sig  . acetaminophen (TYLENOL) 500 MG tablet Take 500 mg by mouth every 6 (six) hours as needed.  Marland Kitchen acetaminophen-codeine (TYLENOL #3) 300-30 MG tablet Take 1 tablet by mouth every 4 (four) hours as needed for moderate pain.  Marland Kitchen albuterol (PROVENTIL) (2.5 MG/3ML) 0.083% nebulizer solution Take 3 mLs (2.5 mg total) by nebulization 3 (three) times daily. X 5 days and then q 6 hrs prn after that  . amLODipine (NORVASC) 10 MG tablet Take 1 tablet (10 mg total) by mouth daily.  Marland Kitchen aspirin 325 MG tablet Take 1 tablet (325 mg total) by mouth daily.  . clopidogrel (PLAVIX) 75 MG tablet Take 1 tablet by mouth once daily  . Dextromethorphan-guaiFENesin (TUSSIN DM MAX PO) Take by mouth. As directed as needed  . ezetimibe (ZETIA) 10 MG tablet Take 1 tablet (10 mg total) by mouth daily.  Marland Kitchen guaifenesin (HUMIBID E) 400 MG TABS tablet Take 400 mg by mouth every 4 (four) hours as needed.  . metoprolol tartrate (LOPRESSOR) 25 MG tablet Take 25 mg by mouth 2 (two) times daily.   . nitroGLYCERIN (NITROSTAT) 0.4  MG SL tablet Place 1 tablet (0.4 mg total) under the tongue every 5 (five) minutes as needed for chest pain.  Marland Kitchen omeprazole (PRILOSEC) 20 MG capsule Take 30- 60 min before your first and last meals of the day  . PARoxetine (PAXIL) 20 MG tablet Take 20 mg by mouth at bedtime.   Marland Kitchen spironolactone (ALDACTONE) 25 MG tablet Take 25 mg by mouth every morning.                Objective:   Physical Exam  10/12/2019        189  04/19/2019        178  01/16/2019        179  12/19/2018       178 12/04/2018        176   11/10/2018       172  12/05/15 182 lb 9.6 oz (82.827 kg)  08/07/15 182 lb (82.555 kg)  07/18/15 176 lb (79.833 kg)    amb wm does not take his PMV out when walking   Vital signs reviewed   10/12/2019  - Note at rest 02 sats  99% on RA      Reports top dentures  HEENT : pt wearing mask not removed for exam due to covid - 19 concerns.   NECK :  without JVD/Nodes/TM/ nl carotid upstrokes bilaterally   LUNGS: no acc muscle use,  Min barrel  contour chest wall with bilateral  slightly decreased bs s audible wheeze and  without cough on insp or exp maneuvers and min  Hyperresonant  to  percussion bilaterally     CV:  RRR  no s3 or murmur or increase in P2, and no edema   ABD:  soft and nontender with pos end  insp Hoover's  in the supine position. No bruits or organomegaly appreciated, bowel sounds nl  MS:   Nl gait/  ext warm without deformities, calf tenderness, cyanosis or clubbing No obvious joint restrictions   SKIN: warm and dry without lesions    NEURO:  alert, approp, nl sensorium with  no motor or cerebellar deficits apparent.              CXR PA and Lateral:   10/12/2019 :    I personally reviewed images and agree with radiology impression as follows:    Lungs show coarse linear and reticular opacities consistent with scarring, stable.              Assessment & Plan:

## 2019-10-12 NOTE — Patient Instructions (Signed)
Prilosec (omerazole) should be Take 30- 60 min before your first and last meals of the day   GERD (REFLUX)  is an extremely common cause of respiratory symptoms just like yours , many times with no obvious heartburn at all.    It can be treated with medication, but also with lifestyle changes including elevation of the head of your bed (ideally with 6 -8inch blocks under the headboard of your bed),  Smoking cessation, avoidance of late meals, excessive alcohol, and avoid fatty foods, chocolate, peppermint, colas, red wine, and acidic juices such as orange juice.  NO MINT OR MENTHOL PRODUCTS SO NO COUGH DROPS  USE SUGARLESS CANDY INSTEAD (Jolley ranchers or Stover's or Life Savers) or even ice chips will also do - the key is to swallow to prevent all throat clearing. NO OIL BASED VITAMINS - use powdered substitutes.  Avoid fish oil when coughing.  For cough > mucinex dm 1200 mg every 12 hours as needed  For shortness of breath>  Nebulized albuterol  up to every 4 hours is needed per Nebulizer    Please remember to go to the  x-ray department  for your tests - we will call you with the results when they are available     Please schedule a follow up visit in 3 months but call sooner if needed  with all medications /inhalers/ solutions in hand so we can verify exactly what you are taking. This includes all medications from all doctors and over the counters  - we will see you in the [place] office

## 2019-10-14 ENCOUNTER — Encounter: Payer: Self-pay | Admitting: Internal Medicine

## 2019-10-14 NOTE — Assessment & Plan Note (Signed)
Onset of recurrent fever early feb 2020 CTa chest 10/13/18 1. No evidence of acute pulmonary embolus. 2. No improvement in abnormal bilateral lung opacity since the CT on 09/25/2018, and right upper lobe involvement appears progressed. 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

## 2019-10-14 NOTE — Assessment & Plan Note (Signed)
Quit smoking 1972  PFT's  09/24/15   FEV1 2.08(82 % ) ratio 81  p 10 % improvement from saba p no rx prior to study with DLCO  58 % correct to 61 % for alv volume   - 12/05/2015  Walked RA x 3 laps @ 185 ft each stopped due to  End of study, nl pace, no desat,  Mild sob - Spirometry 12/05/2015  FEV1 1.78 (64%)  Ratio 74  s truncation - 12/04/2018   Walked RA  2 laps @  approx 249ft each @ mod fast pace  stopped due to  End of study, second lap s trach plug helped doe and allowed faster walk   - 12/19/2018   [facility] RA x one lap =  approx 250 ft - stopped due to  Legs gave out with sats still 98% at moderate pace  - 10/12/2019   Walked RA x two laps =  approx 556ft @ nl pace - stopped due to end of study, min sob  with sats of 91 % at the end of the study and much better with PMV removed   >>> Reminded to take pmv out at hs and with activity.

## 2019-10-14 NOTE — Assessment & Plan Note (Signed)
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          Each maintenance medication was reviewed in detail including emphasizing most importantly the difference between maintenance and prns and under what circumstances the prns are to be triggered using an action plan format where appropriate.  Total time for H and P, chart review, counseling, teaching re using PMV appropriately/  directly observing portions of ambulatory 02 saturation study/  and generating customized AVS unique to this office visit / charting = 30 min

## 2019-10-15 NOTE — Progress Notes (Signed)
ATC, NA and no option to leave msg bc VM was full

## 2019-10-18 ENCOUNTER — Ambulatory Visit: Payer: Medicare HMO | Admitting: Internal Medicine

## 2019-10-25 DIAGNOSIS — R69 Illness, unspecified: Secondary | ICD-10-CM | POA: Diagnosis not present

## 2019-10-29 DIAGNOSIS — C44329 Squamous cell carcinoma of skin of other parts of face: Secondary | ICD-10-CM | POA: Diagnosis not present

## 2019-10-29 DIAGNOSIS — Z85828 Personal history of other malignant neoplasm of skin: Secondary | ICD-10-CM | POA: Diagnosis not present

## 2019-10-29 DIAGNOSIS — D485 Neoplasm of uncertain behavior of skin: Secondary | ICD-10-CM | POA: Diagnosis not present

## 2019-10-29 DIAGNOSIS — L821 Other seborrheic keratosis: Secondary | ICD-10-CM | POA: Diagnosis not present

## 2019-11-14 DIAGNOSIS — H04123 Dry eye syndrome of bilateral lacrimal glands: Secondary | ICD-10-CM | POA: Diagnosis not present

## 2019-11-21 DIAGNOSIS — I2510 Atherosclerotic heart disease of native coronary artery without angina pectoris: Secondary | ICD-10-CM | POA: Diagnosis not present

## 2019-11-21 DIAGNOSIS — Z79899 Other long term (current) drug therapy: Secondary | ICD-10-CM | POA: Diagnosis not present

## 2019-11-21 DIAGNOSIS — I10 Essential (primary) hypertension: Secondary | ICD-10-CM | POA: Diagnosis not present

## 2019-11-21 DIAGNOSIS — R7303 Prediabetes: Secondary | ICD-10-CM | POA: Diagnosis not present

## 2019-11-21 DIAGNOSIS — R69 Illness, unspecified: Secondary | ICD-10-CM | POA: Diagnosis not present

## 2019-11-21 DIAGNOSIS — E785 Hyperlipidemia, unspecified: Secondary | ICD-10-CM | POA: Diagnosis not present

## 2019-11-21 DIAGNOSIS — N183 Chronic kidney disease, stage 3 unspecified: Secondary | ICD-10-CM | POA: Diagnosis not present

## 2019-11-28 DIAGNOSIS — M791 Myalgia, unspecified site: Secondary | ICD-10-CM | POA: Diagnosis not present

## 2019-11-28 DIAGNOSIS — R7303 Prediabetes: Secondary | ICD-10-CM | POA: Diagnosis not present

## 2019-11-28 DIAGNOSIS — Z6831 Body mass index (BMI) 31.0-31.9, adult: Secondary | ICD-10-CM | POA: Diagnosis not present

## 2019-11-28 DIAGNOSIS — N1832 Chronic kidney disease, stage 3b: Secondary | ICD-10-CM | POA: Diagnosis not present

## 2019-11-28 DIAGNOSIS — I10 Essential (primary) hypertension: Secondary | ICD-10-CM | POA: Diagnosis not present

## 2019-11-28 DIAGNOSIS — Z930 Tracheostomy status: Secondary | ICD-10-CM | POA: Diagnosis not present

## 2019-12-11 ENCOUNTER — Telehealth: Payer: Self-pay | Admitting: Cardiology

## 2019-12-11 DIAGNOSIS — C44329 Squamous cell carcinoma of skin of other parts of face: Secondary | ICD-10-CM | POA: Diagnosis not present

## 2019-12-11 DIAGNOSIS — L570 Actinic keratosis: Secondary | ICD-10-CM | POA: Diagnosis not present

## 2019-12-11 NOTE — Telephone Encounter (Signed)
Left message to call back  

## 2019-12-11 NOTE — Telephone Encounter (Signed)
Per phone call from pt's wife Wynonia Lawman-- pt is having left foot swelling, unable to get shoe on, no weight gain, stated weight is the same.   States no Chest pain or SOB-   Please call [phone]

## 2019-12-13 NOTE — Telephone Encounter (Signed)
Pt c/o swelling in L foot 2 days ago to the point he couldn't get shoe on - swelling has gone down since then - denies weight gain/SOB/dizziness/chest pain - will continue to monitor and if swelling gets worse pt will call us back - has f/u with Dr [name] on 8/3

## 2020-02-13 ENCOUNTER — Other Ambulatory Visit: Payer: Self-pay | Admitting: Internal Medicine

## 2020-02-13 ENCOUNTER — Other Ambulatory Visit: Payer: Self-pay | Admitting: Cardiology

## 2020-02-15 ENCOUNTER — Other Ambulatory Visit: Payer: Self-pay | Admitting: Internal Medicine

## 2020-02-26 ENCOUNTER — Telehealth: Payer: Medicare HMO | Admitting: Cardiology

## 2020-03-01 DIAGNOSIS — I499 Cardiac arrhythmia, unspecified: Secondary | ICD-10-CM | POA: Diagnosis not present

## 2020-03-01 DIAGNOSIS — I2119 ST elevation (STEMI) myocardial infarction involving other coronary artery of inferior wall: Secondary | ICD-10-CM | POA: Diagnosis not present

## 2020-03-01 DIAGNOSIS — E1122 Type 2 diabetes mellitus with diabetic chronic kidney disease: Secondary | ICD-10-CM | POA: Diagnosis not present

## 2020-03-01 DIAGNOSIS — R079 Chest pain, unspecified: Secondary | ICD-10-CM | POA: Diagnosis not present

## 2020-03-01 DIAGNOSIS — I2111 ST elevation (STEMI) myocardial infarction involving right coronary artery: Secondary | ICD-10-CM | POA: Diagnosis not present

## 2020-03-01 DIAGNOSIS — K219 Gastro-esophageal reflux disease without esophagitis: Secondary | ICD-10-CM | POA: Diagnosis not present

## 2020-03-01 DIAGNOSIS — I4510 Unspecified right bundle-branch block: Secondary | ICD-10-CM | POA: Diagnosis not present

## 2020-03-01 DIAGNOSIS — I10 Essential (primary) hypertension: Secondary | ICD-10-CM | POA: Diagnosis not present

## 2020-03-01 DIAGNOSIS — E1165 Type 2 diabetes mellitus with hyperglycemia: Secondary | ICD-10-CM | POA: Diagnosis not present

## 2020-03-01 DIAGNOSIS — I25118 Atherosclerotic heart disease of native coronary artery with other forms of angina pectoris: Secondary | ICD-10-CM | POA: Diagnosis not present

## 2020-03-01 DIAGNOSIS — N179 Acute kidney failure, unspecified: Secondary | ICD-10-CM | POA: Diagnosis not present

## 2020-03-01 DIAGNOSIS — E785 Hyperlipidemia, unspecified: Secondary | ICD-10-CM | POA: Diagnosis not present

## 2020-03-01 DIAGNOSIS — R69 Illness, unspecified: Secondary | ICD-10-CM | POA: Diagnosis not present

## 2020-03-01 DIAGNOSIS — R748 Abnormal levels of other serum enzymes: Secondary | ICD-10-CM | POA: Diagnosis not present

## 2020-03-01 DIAGNOSIS — N1831 Chronic kidney disease, stage 3a: Secondary | ICD-10-CM | POA: Diagnosis not present

## 2020-03-01 DIAGNOSIS — R739 Hyperglycemia, unspecified: Secondary | ICD-10-CM | POA: Diagnosis not present

## 2020-03-01 DIAGNOSIS — I213 ST elevation (STEMI) myocardial infarction of unspecified site: Secondary | ICD-10-CM | POA: Diagnosis not present

## 2020-03-01 DIAGNOSIS — I129 Hypertensive chronic kidney disease with stage 1 through stage 4 chronic kidney disease, or unspecified chronic kidney disease: Secondary | ICD-10-CM | POA: Diagnosis not present

## 2020-03-01 DIAGNOSIS — Z889 Allergy status to unspecified drugs, medicaments and biological substances status: Secondary | ICD-10-CM | POA: Diagnosis not present

## 2020-03-01 DIAGNOSIS — I25810 Atherosclerosis of coronary artery bypass graft(s) without angina pectoris: Secondary | ICD-10-CM | POA: Diagnosis not present

## 2020-03-01 DIAGNOSIS — R0789 Other chest pain: Secondary | ICD-10-CM | POA: Diagnosis not present

## 2020-03-01 DIAGNOSIS — I2510 Atherosclerotic heart disease of native coronary artery without angina pectoris: Secondary | ICD-10-CM | POA: Diagnosis not present

## 2020-03-01 HISTORY — DX: ST elevation (STEMI) myocardial infarction involving other coronary artery of inferior wall: I21.19

## 2020-03-03 ENCOUNTER — Telehealth: Payer: Self-pay | Admitting: Cardiology

## 2020-03-03 NOTE — Telephone Encounter (Signed)
Per protocol, pt is to have 2 week apt with APP (confirmed by [facility] PA-C) and then after that visit a 2 month with MD. Dr.Fagan's office also called regarding apt and they were informed of apt  on 8/23 with A.Quinn, NP

## 2020-03-03 NOTE — Telephone Encounter (Signed)
New message    Per wife patient had heart attack and was transported to [facility] he is being discharged today and needs follow up , offered next available appt with Jonni Sanger in [place] 8/23 , wife refused appt only wants to see Dr [name] next week , please advise

## 2020-03-14 ENCOUNTER — Encounter: Payer: Self-pay | Admitting: *Deleted

## 2020-03-16 NOTE — Progress Notes (Signed)
Cardiology Office Note  Date: 03/17/2020   ID: Gleason, Ardoin [birth year], MRN [id number]  PCP:  [name], MD  Cardiologist:  Carlyle Dolly, MD Electrophysiologist:  None   Chief Complaint: Follow-up status post myocardial infarction with hospital stay at [facility]  History of Present Illness: Kenneth [name] is a 79 y.o. male with a history of CAD, HLD, HTN, carotid stenosis, shortness of breath, history of throat cancer with tracheostomy, history of CVA 2011 with no focal residual deficits, tobacco abuse, PVD, GERD, status post CABG in 1997.  Last saw Dr. [name] 2019-08-09.  Had no recent chest pain.  Chronic SOB unchanged. He was compliant with meds. Intolerant to statins on Zetia only. He was compliant with HTN meds. Prior carotid stenting being followed by vascular. On DAPT. No recent CAD symptoms. DAPT therapy more r/t to CVA and carotid disease. He was referred to lipid clinic to consider PCSK9 inhibitors  Recent presentation to [facility] with complaints of substernal chest pain onset approximately 1 hour prior to arrival.  Described as being over the left-sided chest and radiating to left arm.  He denied any diaphoresis, nausea, vomiting or shortness of breath.  EKG in ED showed ST elevation in inferior leads with reciprocal changes.  A code STEMI was called and the patient was taken to the Cath Lab emergently.  He was admitted after cardiac catheterization with drug-eluting stent to RCA following presentation with inferior wall STEMI.  Had an echocardiogram showed normal LV size and function.  EF 60 to 65%, normal LV wall thickness, aortic valve is thickened and calcified. .  Cardiac catheterization showed a left main with 80% stenosis.  LAD with ostial 90% stenosis, proximal vessel has severe diffuse atherosclerotic disease with a mid 99% lesion.  The distal LAD fills via patent LIMA graft.  Ramus intermedius small vessel with minor  irregularities.  Left circumflex has a proximal 80 to 90% tubular stenosis, the OM1 is occluded proximally.  The OM 2 has a proximal 30% stenosis and distally has a minor irregularities only.  It is a large vessel.  RCA is a large dominant vessel.  The proximal LAD segment has a 50% tubular stenosis.  Mid vessel has 95% ulcerated lesion.  This is the likely culprit for the patient's presentation.  Distally the RCA divides into the medium sized RPDA and RPL branches.  RP DA has multiple sequential 90% lesions.  RPL is without disease and had multiple branches.  LIMA to LAD graft was patent.  Patient was discharged on aspirin 81 mg and Plavix 75 mg daily for at least 12 months.  He presents today for hospital follow-up.  He denies any progressive anginal or exertional symptoms.  States he does not feel like he has bad indigestion like he did prior to the recent RCA stent stent.  He states his symptoms were similar to indigestion but started to radiate up into his shoulders and his neck and he knew this was different.  He denies any similar symptoms now.  Denies any orthostatic symptoms, PND, indigestion-like symptoms.  He does have significant reflux disease.  He states this is what he initially thought it may have been but given the fact that it started to radiate to his shoulders and neck he knew something was different.  He denies any claudication-like symptoms, DVT or PE-like symptoms, or lower extremity edema.  Denies any palpitations or arrhythmias, CVA or TIA like symptoms.  Denies any bleeding.  He is on aspirin and Brilinta DAPT therapy for 12 months.    Past Medical History:  Diagnosis Date  . Anxiety   . 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 ASCVD (arteriosclerotic cardiovascular disease)   . Chronic lower back pain    "have it at night" (05/04/2018)  . Coronary artery disease    a. 2003: s/p CABG x3V  b. 2007: cath with patent bypass grafts.  c. 09/2016: [place] with cath showing patent bypass  grafts, medical Rx recommended  . CVA (cerebral vascular accident) ([place]) 10/2017   "little numb on my left face since" (05/04/2018)  . Depression   . GERD (gastroesophageal reflux disease)   . High triglycerides   . History of kidney stones   . Hypertension   . 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 Malodorous urine    "in the last 5wks; since I had trach put in" (05/04/2018)  . Peripheral vascular disease ([place])   . Stroke [facility])    "he's had several little strokes; many that he wasn't aware of" (05/04/2018)  . Tobacco abuse   . Tracheal stenosis     Past Surgical History:  Procedure Laterality Date  . CAROTID STENT Right 06-13-12; 05/04/2018  . CAROTID STENT INSERTION N/A 06/13/2012   Procedure: CAROTID STENT INSERTION;  Surgeon: Serafina Mitchell, MD;  Location: [facility] CATH LAB;  Service: Cardiovascular;  Laterality: N/A;  . CATARACT EXTRACTION, BILATERAL Bilateral   . COLONOSCOPY  11/10/2011   Dr. [name]: hemorrhoids, tubular adenoma  . CORNEAL TRANSPLANT Bilateral   . CORONARY ARTERY BYPASS GRAFT  ~ 2003   "CABG X3"  . EYE SURGERY    . INSERTION OF RETROGRADE CAROTID STENT Right 05/04/2018   Procedure: INSERTION OF RIGHT  CAROTID STENT using an ABBOT- XACT carotid stent system;  Surgeon: Serafina Mitchell, MD;  Location: [place];  Service: Vascular;  Laterality: Right;  . LAPAROSCOPIC CHOLECYSTECTOMY  2007  . LEFT HEART CATH AND CORS/GRAFTS ANGIOGRAPHY N/A 10/07/2016   Procedure: Left Heart Cath and Cors/Grafts Angiography;  Surgeon: Troy Sine, MD;  Location: [place] CV LAB;  Service: Cardiovascular;  Laterality: N/A;  . PARTIAL LARYNGECTOMY  1998  . TRACHEOSTOMY  03/13/2018   "@ [facility]"    Current Outpatient Medications  Medication Sig Dispense Refill  . acetaminophen (TYLENOL) 500 MG tablet Take 500 mg by mouth every 6 (six) hours as needed.    Marland Kitchen acetaminophen-codeine (TYLENOL #3) 300-30 MG tablet Take 1 tablet by mouth every 4 (four) hours as needed for moderate pain.    Marland Kitchen  albuterol (PROVENTIL) (2.5 MG/3ML) 0.083% nebulizer solution Take 3 mLs (2.5 mg total) by nebulization 3 (three) times daily. X 5 days and then q 6 hrs prn after that 75 mL 12  . amLODipine (NORVASC) 10 MG tablet Take 1 tablet (10 mg total) by mouth daily. 90 tablet 3  . aspirin EC 81 MG tablet Take 81 mg by mouth daily. Swallow whole.    . clopidogrel (PLAVIX) 75 MG tablet Take 1 tablet by mouth once daily 90 tablet 3  . ezetimibe (ZETIA) 10 MG tablet Take 1 tablet (10 mg total) by mouth daily. 90 tablet 3  . losartan (COZAAR) 50 MG tablet Take 50 mg by mouth daily.    . metoprolol tartrate (LOPRESSOR) 25 MG tablet Take 25 mg by mouth 2 (two) times daily.     Marland Kitchen omeprazole (PRILOSEC) 20 MG capsule TAKE 30-60 MINUTES BEFORE YOUR FIRST AND LAST MEALS OF THE DAY  60 capsule 3  . PARoxetine (PAXIL) 20 MG tablet Take 20 mg by mouth at bedtime.     Marland Kitchen Dextromethorphan-guaiFENesin (TUSSIN DM MAX PO) Take by mouth. As directed as needed    . guaifenesin (HUMIBID E) 400 MG TABS tablet Take 400 mg by mouth every 4 (four) hours as needed.    . nitroGLYCERIN (NITROSTAT) 0.4 MG SL tablet Place 1 tablet (0.4 mg total) under the tongue every 5 (five) minutes as needed for chest pain. 25 tablet 3   No current facility-administered medications for this visit.   Allergies:  Nifedipine, Lorazepam, Other, Penicillins, Sulfacetamide, Sulfonamide derivatives, Erythromycin, Statins, and Vancomycin   Social History: The patient  reports that he quit smoking about 49 years ago. His smoking use included cigarettes. He started smoking about 66 years ago. He has a 15.00 pack-year smoking history. He quit smokeless tobacco use about 17 years ago.  His smokeless tobacco use included chew. He reports that he does not drink alcohol and does not use drugs.   Family History: The patient's family history includes Cancer in his brother and brother; Dementia in his mother; Heart disease in his brother and father; Hyperlipidemia in his  brother.   ROS:  Please see the history of present illness. Otherwise, complete review of systems is positive for none.  All other systems are reviewed and negative.   Physical Exam: VS:  BP 112/60   Pulse (!) 50   Ht _0  (1.676 m)   Wt 186 lb (84.4 kg)   SpO2 98%   BMI 30.02 kg/m , BMI Body mass index is 30.02 kg/m.  Wt Readings from Last 3 Encounters:  03/17/20 186 lb (84.4 kg)  10/12/19 189 lb (85.7 kg)  08/09/19 184 lb (83.5 kg)    General: Patient appears comfortable at rest. Neck: Supple, no elevated JVP or carotid bruits, no thyromegaly.  Patient has a tracheostomy Lungs: Clear to auscultation, nonlabored breathing at rest. Cardiac: Regular rate and rhythm, no S3 or significant systolic murmur, no pericardial rub. Extremities: No pitting edema, distal pulses 2+. Skin: Warm and dry. Musculoskeletal: No kyphosis. Neuropsychiatric: Alert and oriented x3, affect grossly appropriate.  ECG:  An ECG dated EKG March 02, 2020 was personally reviewed today and demonstrated:  Sinus bradycardia rate of 52, right bundle branch block, moderate T wave abnormality  Recent Labwork: No results found for requested labs within last 8760 hours.     Component Value Date/Time   CHOL 185 11/15/2017 0714   TRIG 439 (H) 11/15/2017 0714   HDL 29 (L) 11/15/2017 0714   CHOLHDL 6.4 11/15/2017 0714   VLDL UNABLE TO CALCULATE IF TRIGLYCERIDE OVER 400 mg/dL 11/15/2017 0714   LDLCALC UNABLE TO CALCULATE IF TRIGLYCERIDE OVER 400 mg/dL 11/15/2017 4132    Other Studies Reviewed Today:  Echocardiogram 03/02/2020 [facility]. Impression: Normal LV size with normal systolic function.  Ejection fraction 60 to 65%, normal LV filling, there is normal left ventricular wall thickness. Normal right ventricular size with normal function. Mildly dilated left atrium. There is mild aortic regurgitation and mild aortic stenosis.  The aortic valve is thickened and calcified. There is mild  mitral regurgitation. There is mild tricuspid regurgitation.  Normal pulmonary artery systolic pressure.  Cardiac catheterization [facility] 03/01/2020 Cardiac catheterization showed a left main with 80% stenosis.  LAD with ostial 90% stenosis, proximal vessel has severe diffuse atherosclerotic disease with a mid 99% lesion.  The distal LAD fills via patent LIMA graft.  Ramus intermedius small vessel with minor irregularities.  Left circumflex has a proximal 80 to 90% tubular stenosis, the OM1 is occluded proximally.  The OM 2 has a proximal 30% stenosis and distally has a minor irregularities only.  It is a large vessel.  RCA is a large dominant vessel.  The proximal LAD segment has a 50% tubular stenosis.  Mid vessel has 95% ulcerated lesion.  This is the likely culprit for the patient's presentation.  Distally the RCA divides into the medium sized RPDA and RPL branches.  RP DA has multiple sequential 90% lesions.  RPL is without disease and had multiple branches.  LIMA to LAD graft was patent.  Patient was discharged on aspirin 81 mg and Plavix 75 mg for at least 12 months.  Status post DES to RCA   Diagnostic Studies Carotid artery duplex study 05/29/2018  Summary:  Right Carotid: Non-hemodynamically significant plaque <50% noted in the  CCA. The ECA appears <50% stenosed. Patent right CCA stent without evidence of hyperplasia or restenosis. CCA proximal to  stent not visualized. Stent ends jut proximal to the CCA Bifurcation. Patent right ICA stent without evidence of hyperplasia or      restenosis. Plaque is noted in the ICA proximal to stent.   Left Carotid: Velocities in the left ICA are consistent with a 40-59%  stenosis. Non-hemodynamically significant plaque noted in the CCA. The  ECA appears <50% stenosed.   Vertebrals: Bilateral vertebral arteries demonstrate antegrade flow.  Subclavians: Bilateral subclavian arteries were stenotic.    07/2011  MPI Tomographic views were obtained using the short axis, vertical long axis, and horizontal long axis planes. No significant, reversible perfusion defects are noted to indicate ischemia.  Gated imaging reveals an EDV of 59, ESV of 18, T I D ratio of 0.80, and LVEF of 69%.  IMPRESSION: Low risk exercise/Lexiscan Myoview as outlined. No diagnostic ST- segment changes or arrhythmias were noted. There is evidence of soft tissue attenuation, however no frank scar or ischemia. LVEF is normal at 69%.  04/2012 Echo LVEF 65-70%, grade II diastolic dysfunction,   1/61/09 Echo Study Conclusions  - Left ventricle: The cavity size was normal. Wall thickness was increased in a pattern of mild LVH. Systolic function was vigorous. The estimated ejection fraction was in the range of 65% to 70%. Wall motion was normal; there were no regional wall motion abnormalities. Features are consistent with a pseudonormal left ventricular filling pattern, with concomitant abnormal relaxation and increased filling pressure (grade 2 diastolic dysfunction). - Aortic valve: Mildly calcified annulus. Trileaflet. Trivial regurgitation. - Mitral valve: Calcified annulus. Trivial regurgitation. - Left atrium: The atrium was mildly dilated. - Right ventricle: Systolic function was low normal. - Right atrium: Central venous pressure: 32m Hg (est). - Atrial septum: No defect or patent foramen ovale was identified. - Tricuspid valve: Trivial regurgitation. - Pulmonary arteries: Systolic pressure could not be accurately estimated. - Pericardium, extracardiac: There was no pericardial effusion. Impressions:  - Normal LV chamber size with mild LVH and LVEF 660-45% grade 2 diastolic dysfunction. MIld left atrial enlargement. Mild MAC. Low normal RV contraction. Unable to assess PASP - trivial tricuspid regurgitation.  11/2013 Lexiscan MPI IMPRESSION: 1. Negative Lexiscan MPI for ischemia  2.  Normal left ventricular systolic function, LVEF 640% 3. Low risk study for major cardiac events.   09/2016 cath  LM lesion, 50 %stenosed.  Prox LAD to Mid LAD lesion, 70 %stenosed.  Ost 1st Mrg lesion, 100 %stenosed.  RIMA and  is normal in caliber and anatomically normal.  RPDA-2 lesion, 50 %stenosed.  RPDA-1 lesion, 60 %stenosed.  LIMA.  The left ventricular systolic function is normal.  LV end diastolic pressure is normal.  Normal LV function without focal segmental wall motion abnormalities.  Multivessel native CAD with 50% ostial stenosis of the LAD and diffuse 70% mid stenoses, occlusion of the OM1 vessel of the circumflex, and diffuse 60 and 50% PDA stenoses in the small caliber PDA vessel.  Patent sequential LIMA graft supplying a twin like diagonal and LAD system system.  Patent RIMA graft supplying the circumflex marginal vessel.  RECOMMENDATION: Medical therapy.   10/2017 echo Study Conclusions  - Left ventricle: The cavity size was normal. Wall thickness was normal. Systolic function was normal. The estimated ejection fraction was in the range of 60% to 65%. Wall motion was normal; there were no regional wall motion abnormalities. Left ventricular diastolic function parameters were normal. - Aortic valve: Mildly calcified annulus. Trileaflet; mildly thickened leaflets. Valve area (VTI): 3.01 cm^2. Valve area (Vmax): 2.24 cm^2. Valve area (Vmean): 2.3 cm^2. - Mitral valve: Mildly calcified annulus. Mildly thickened leaflets . - Left atrium: The atrium was mildly dilated.  12/2017 nuclear stress  No diagnostic ST segment changes to indicate ischemia.  No significant myocardial perfusion defects to indicate scar or ischemia.  This is a low risk study.  Nuclear stress EF: 67%.    Assessment and Plan:  1. CAD in native artery   2. Mixed hyperlipidemia   3. Essential hypertension   4. Carotid stenosis, bilateral    5. Follow-up of heart attack     1. CAD in native artery Recent inferior STEMI with stent to RCA.  He had residual disease as noted above and cardiac catheterization report.  Continue DAPT therapy with aspirin and Plavix 75 mg x 12 months.  Continue nitroglycerin 0.4 mg sublingual as needed chest pain.  2. Mixed hyperlipidemia Patient has statin intolerance.  Please refer to lipid clinic for possible initiation of PCSK9 inhibitor.  3. Essential hypertension Patient is normotensive today.  Continue losartan 50 mg p.o. daily.  Amlodipine 10 mg daily.  4. Carotid stenosis, bilateral History of carotid artery stenosis with stents in place.carotid duplex 2019:  Patent right CCA stent without evidence of hyperplasia or restenosis. CCA Proximal to stent not visualized. Stent ends jut proximal to the CCA Bifurcation. Patent right ICA stent without evidence of hyperplasia orrestenosis. Plaque is noted in the ICA proximal to stent  Medication Adjustments/Labs and Tests Ordered: Current medicines are reviewed at length with the patient today.  Concerns regarding medicines are outlined above.   Disposition: Follow-up with Dr. [name] or APP in 3 months.  Signed, Levell July, NP 03/17/2020 12:49 PM    [facility], [place], [place] [address] Phone: [phone]; Fax: [phone]

## 2020-03-17 ENCOUNTER — Encounter: Payer: Self-pay | Admitting: Family Medicine

## 2020-03-17 ENCOUNTER — Other Ambulatory Visit: Payer: Self-pay

## 2020-03-17 ENCOUNTER — Ambulatory Visit (INDEPENDENT_AMBULATORY_CARE_PROVIDER_SITE_OTHER): Payer: Medicare HMO | Admitting: Family Medicine

## 2020-03-17 VITALS — BP 112/60 | HR 50 | Ht 66.0 in | Wt 186.0 lb

## 2020-03-17 DIAGNOSIS — I252 Old myocardial infarction: Secondary | ICD-10-CM | POA: Diagnosis not present

## 2020-03-17 DIAGNOSIS — E782 Mixed hyperlipidemia: Secondary | ICD-10-CM | POA: Diagnosis not present

## 2020-03-17 DIAGNOSIS — I2510 Atherosclerotic heart disease of native coronary artery without angina pectoris: Secondary | ICD-10-CM

## 2020-03-17 DIAGNOSIS — Z09 Encounter for follow-up examination after completed treatment for conditions other than malignant neoplasm: Secondary | ICD-10-CM | POA: Diagnosis not present

## 2020-03-17 DIAGNOSIS — I6523 Occlusion and stenosis of bilateral carotid arteries: Secondary | ICD-10-CM

## 2020-03-17 DIAGNOSIS — I10 Essential (primary) hypertension: Secondary | ICD-10-CM

## 2020-03-17 NOTE — Patient Instructions (Addendum)
Medication Instructions:  Continue all current medications.  Labwork: none  Testing/Procedures: Your physician has requested that you have an echocardiogram. Echocardiography is a painless test that uses sound waves to create images of your heart. It provides your doctor with information about the size and shape of your heart and how well your heart's chambers and valves are working. This procedure takes approximately one hour. There are no restrictions for this procedure - DUE JUST PRIOR TO NEXT VISIT  Follow-Up: 3 months   Any Other Special Instructions Will Be Listed Below (If Applicable).  If you need a refill on your cardiac medications before your next appointment, please call your pharmacy.

## 2020-03-20 ENCOUNTER — Telehealth: Payer: Self-pay | Admitting: *Deleted

## 2020-03-20 NOTE — Telephone Encounter (Signed)
Patient seen by Jonni Sanger on 03/17/2020 - wanted to refer patient to Lipid Clinc as he is statin intolerant.  Discussed with wife Andee Poles) & they decline at this time.  Stated that this had mentioned to her before, but just thought it would be too much to handle right now, esp with his trach.

## 2020-03-27 DIAGNOSIS — J398 Other specified diseases of upper respiratory tract: Secondary | ICD-10-CM | POA: Diagnosis not present

## 2020-03-27 DIAGNOSIS — Z930 Tracheostomy status: Secondary | ICD-10-CM | POA: Diagnosis not present

## 2020-03-27 DIAGNOSIS — Z8521 Personal history of malignant neoplasm of larynx: Secondary | ICD-10-CM | POA: Diagnosis not present

## 2020-04-02 DIAGNOSIS — I10 Essential (primary) hypertension: Secondary | ICD-10-CM | POA: Diagnosis not present

## 2020-04-02 DIAGNOSIS — E785 Hyperlipidemia, unspecified: Secondary | ICD-10-CM | POA: Diagnosis not present

## 2020-04-02 DIAGNOSIS — R7303 Prediabetes: Secondary | ICD-10-CM | POA: Diagnosis not present

## 2020-04-02 DIAGNOSIS — N183 Chronic kidney disease, stage 3 unspecified: Secondary | ICD-10-CM | POA: Diagnosis not present

## 2020-04-02 DIAGNOSIS — K219 Gastro-esophageal reflux disease without esophagitis: Secondary | ICD-10-CM | POA: Diagnosis not present

## 2020-04-09 DIAGNOSIS — G720 Drug-induced myopathy: Secondary | ICD-10-CM | POA: Diagnosis not present

## 2020-04-09 DIAGNOSIS — R7303 Prediabetes: Secondary | ICD-10-CM | POA: Diagnosis not present

## 2020-04-09 DIAGNOSIS — I2510 Atherosclerotic heart disease of native coronary artery without angina pectoris: Secondary | ICD-10-CM | POA: Diagnosis not present

## 2020-04-09 DIAGNOSIS — N1832 Chronic kidney disease, stage 3b: Secondary | ICD-10-CM | POA: Diagnosis not present

## 2020-04-14 ENCOUNTER — Ambulatory Visit: Payer: Medicare HMO | Admitting: Cardiology

## 2020-04-16 DIAGNOSIS — C44222 Squamous cell carcinoma of skin of right ear and external auricular canal: Secondary | ICD-10-CM | POA: Diagnosis not present

## 2020-04-16 DIAGNOSIS — C44622 Squamous cell carcinoma of skin of right upper limb, including shoulder: Secondary | ICD-10-CM | POA: Diagnosis not present

## 2020-04-16 DIAGNOSIS — D485 Neoplasm of uncertain behavior of skin: Secondary | ICD-10-CM | POA: Diagnosis not present

## 2020-04-28 DIAGNOSIS — Z23 Encounter for immunization: Secondary | ICD-10-CM | POA: Diagnosis not present

## 2020-05-05 ENCOUNTER — Other Ambulatory Visit: Payer: Self-pay | Admitting: Cardiology

## 2020-05-24 ENCOUNTER — Other Ambulatory Visit: Payer: Self-pay | Admitting: Cardiology

## 2020-05-27 DIAGNOSIS — C44222 Squamous cell carcinoma of skin of right ear and external auricular canal: Secondary | ICD-10-CM | POA: Diagnosis not present

## 2020-05-27 DIAGNOSIS — C44622 Squamous cell carcinoma of skin of right upper limb, including shoulder: Secondary | ICD-10-CM | POA: Diagnosis not present

## 2020-06-04 ENCOUNTER — Encounter (HOSPITAL_COMMUNITY): Payer: Self-pay | Admitting: Emergency Medicine

## 2020-06-04 ENCOUNTER — Telehealth: Payer: Self-pay | Admitting: Cardiology

## 2020-06-04 ENCOUNTER — Emergency Department (HOSPITAL_COMMUNITY): Payer: Medicare HMO

## 2020-06-04 ENCOUNTER — Other Ambulatory Visit: Payer: Self-pay

## 2020-06-04 ENCOUNTER — Inpatient Hospital Stay (HOSPITAL_COMMUNITY)
Admission: EM | Admit: 2020-06-04 | Discharge: 2020-06-06 | DRG: 247 | Disposition: A | Discharge status: Home / Self Care | Payer: Medicare HMO | Attending: Cardiology | Admitting: Cardiology

## 2020-06-04 DIAGNOSIS — K219 Gastro-esophageal reflux disease without esophagitis: Secondary | ICD-10-CM | POA: Diagnosis not present

## 2020-06-04 DIAGNOSIS — Z9841 Cataract extraction status, right eye: Secondary | ICD-10-CM

## 2020-06-04 DIAGNOSIS — F32A Depression, unspecified: Secondary | ICD-10-CM | POA: Diagnosis not present

## 2020-06-04 DIAGNOSIS — Z880 Allergy status to penicillin: Secondary | ICD-10-CM | POA: Diagnosis not present

## 2020-06-04 DIAGNOSIS — I252 Old myocardial infarction: Secondary | ICD-10-CM

## 2020-06-04 DIAGNOSIS — E781 Pure hyperglyceridemia: Secondary | ICD-10-CM | POA: Diagnosis not present

## 2020-06-04 DIAGNOSIS — I6523 Occlusion and stenosis of bilateral carotid arteries: Secondary | ICD-10-CM | POA: Diagnosis not present

## 2020-06-04 DIAGNOSIS — Z8673 Personal history of transient ischemic attack (TIA), and cerebral infarction without residual deficits: Secondary | ICD-10-CM | POA: Diagnosis not present

## 2020-06-04 DIAGNOSIS — Z20822 Contact with and (suspected) exposure to covid-19: Secondary | ICD-10-CM | POA: Diagnosis not present

## 2020-06-04 DIAGNOSIS — Z947 Corneal transplant status: Secondary | ICD-10-CM

## 2020-06-04 DIAGNOSIS — Z8521 Personal history of malignant neoplasm of larynx: Secondary | ICD-10-CM

## 2020-06-04 DIAGNOSIS — E1122 Type 2 diabetes mellitus with diabetic chronic kidney disease: Secondary | ICD-10-CM | POA: Diagnosis present

## 2020-06-04 DIAGNOSIS — I129 Hypertensive chronic kidney disease with stage 1 through stage 4 chronic kidney disease, or unspecified chronic kidney disease: Secondary | ICD-10-CM | POA: Diagnosis not present

## 2020-06-04 DIAGNOSIS — Z79899 Other long term (current) drug therapy: Secondary | ICD-10-CM | POA: Diagnosis not present

## 2020-06-04 DIAGNOSIS — Z9842 Cataract extraction status, left eye: Secondary | ICD-10-CM

## 2020-06-04 DIAGNOSIS — E1151 Type 2 diabetes mellitus with diabetic peripheral angiopathy without gangrene: Secondary | ICD-10-CM | POA: Diagnosis present

## 2020-06-04 DIAGNOSIS — Z818 Family history of other mental and behavioral disorders: Secondary | ICD-10-CM

## 2020-06-04 DIAGNOSIS — I25119 Atherosclerotic heart disease of native coronary artery with unspecified angina pectoris: Secondary | ICD-10-CM | POA: Diagnosis present

## 2020-06-04 DIAGNOSIS — R778 Other specified abnormalities of plasma proteins: Secondary | ICD-10-CM

## 2020-06-04 DIAGNOSIS — I255 Ischemic cardiomyopathy: Secondary | ICD-10-CM | POA: Diagnosis present

## 2020-06-04 DIAGNOSIS — Z923 Personal history of irradiation: Secondary | ICD-10-CM | POA: Diagnosis not present

## 2020-06-04 DIAGNOSIS — Z7902 Long term (current) use of antithrombotics/antiplatelets: Secondary | ICD-10-CM

## 2020-06-04 DIAGNOSIS — I214 Non-ST elevation (NSTEMI) myocardial infarction: Principal | ICD-10-CM | POA: Diagnosis present

## 2020-06-04 DIAGNOSIS — Z955 Presence of coronary angioplasty implant and graft: Secondary | ICD-10-CM

## 2020-06-04 DIAGNOSIS — I10 Essential (primary) hypertension: Secondary | ICD-10-CM | POA: Diagnosis present

## 2020-06-04 DIAGNOSIS — E785 Hyperlipidemia, unspecified: Secondary | ICD-10-CM | POA: Diagnosis present

## 2020-06-04 DIAGNOSIS — Z87442 Personal history of urinary calculi: Secondary | ICD-10-CM

## 2020-06-04 DIAGNOSIS — Z7982 Long term (current) use of aspirin: Secondary | ICD-10-CM | POA: Diagnosis not present

## 2020-06-04 DIAGNOSIS — F419 Anxiety disorder, unspecified: Secondary | ICD-10-CM | POA: Diagnosis not present

## 2020-06-04 DIAGNOSIS — I2510 Atherosclerotic heart disease of native coronary artery without angina pectoris: Secondary | ICD-10-CM

## 2020-06-04 DIAGNOSIS — J029 Acute pharyngitis, unspecified: Secondary | ICD-10-CM

## 2020-06-04 DIAGNOSIS — Z951 Presence of aortocoronary bypass graft: Secondary | ICD-10-CM | POA: Diagnosis not present

## 2020-06-04 DIAGNOSIS — Z930 Tracheostomy status: Secondary | ICD-10-CM

## 2020-06-04 DIAGNOSIS — Z888 Allergy status to other drugs, medicaments and biological substances status: Secondary | ICD-10-CM

## 2020-06-04 DIAGNOSIS — R0789 Other chest pain: Secondary | ICD-10-CM | POA: Diagnosis not present

## 2020-06-04 DIAGNOSIS — R079 Chest pain, unspecified: Secondary | ICD-10-CM | POA: Diagnosis not present

## 2020-06-04 DIAGNOSIS — R7989 Other specified abnormal findings of blood chemistry: Secondary | ICD-10-CM | POA: Diagnosis not present

## 2020-06-04 DIAGNOSIS — Z882 Allergy status to sulfonamides status: Secondary | ICD-10-CM

## 2020-06-04 DIAGNOSIS — N1831 Chronic kidney disease, stage 3a: Secondary | ICD-10-CM | POA: Diagnosis present

## 2020-06-04 DIAGNOSIS — Z8249 Family history of ischemic heart disease and other diseases of the circulatory system: Secondary | ICD-10-CM

## 2020-06-04 DIAGNOSIS — G459 Transient cerebral ischemic attack, unspecified: Secondary | ICD-10-CM | POA: Diagnosis present

## 2020-06-04 DIAGNOSIS — E782 Mixed hyperlipidemia: Secondary | ICD-10-CM | POA: Diagnosis present

## 2020-06-04 DIAGNOSIS — Z83438 Family history of other disorder of lipoprotein metabolism and other lipidemia: Secondary | ICD-10-CM

## 2020-06-04 LAB — CBC
HCT: 39.1 % (ref 39.0–52.0)
Hemoglobin: 12 g/dL — ABNORMAL LOW (ref 13.0–17.0)
MCH: 29.3 pg (ref 26.0–34.0)
MCHC: 30.7 g/dL (ref 30.0–36.0)
MCV: 95.4 fL (ref 80.0–100.0)
Platelets: 226 10*3/uL (ref 150–400)
RBC: 4.1 MIL/uL — ABNORMAL LOW (ref 4.22–5.81)
RDW: 14 % (ref 11.5–15.5)
WBC: 9.5 10*3/uL (ref 4.0–10.5)
nRBC: 0 % (ref 0.0–0.2)

## 2020-06-04 LAB — BASIC METABOLIC PANEL
Anion gap: 8 (ref 5–15)
BUN: 19 mg/dL (ref 8–23)
CO2: 27 mmol/L (ref 22–32)
Calcium: 8.8 mg/dL — ABNORMAL LOW (ref 8.9–10.3)
Chloride: 101 mmol/L (ref 98–111)
Creatinine, Ser: 1.45 mg/dL — ABNORMAL HIGH (ref 0.61–1.24)
GFR, Estimated: 48 mL/min — ABNORMAL LOW (ref >60)
Glucose, Bld: 134 mg/dL — ABNORMAL HIGH (ref 70–99)
Potassium: 4.4 mmol/L (ref 3.5–5.1)
Sodium: 136 mmol/L (ref 135–145)

## 2020-06-04 LAB — TROPONIN I (HIGH SENSITIVITY)
Troponin I (High Sensitivity): 1929 ng/L (ref <18)
Troponin I (High Sensitivity): 2373 ng/L (ref <18)
Troponin I (High Sensitivity): 2990 ng/L (ref <18)
Troponin I (High Sensitivity): 2596 ng/L (ref <18)

## 2020-06-04 LAB — CBG MONITORING, ED: Glucose-Capillary: 94 mg/dL (ref 70–99)

## 2020-06-04 MED ORDER — ACETAMINOPHEN 325 MG PO TABS: 650.0000 mg | ORAL_TABLET | ORAL | Status: DC | PRN

## 2020-06-04 MED ORDER — ASPIRIN 81 MG PO CHEW
81.0000 mg | CHEWABLE_TABLET | ORAL | Status: AC
  Administered 2020-06-05: 81 mg via ORAL
  Filled 2020-06-04: qty 1

## 2020-06-04 MED ORDER — CLOPIDOGREL BISULFATE 75 MG PO TABS
75.0000 mg | ORAL_TABLET | Freq: Every day | ORAL | Status: DC
  Administered 2020-06-05: 75 mg via ORAL
  Filled 2020-06-04: qty 1

## 2020-06-04 MED ORDER — METOPROLOL TARTRATE 25 MG PO TABS
25.0000 mg | ORAL_TABLET | Freq: Two times a day (BID) | ORAL | Status: DC
  Administered 2020-06-04 – 2020-06-06 (×3): 25 mg via ORAL
  Filled 2020-06-04 (×3): qty 1

## 2020-06-04 MED ORDER — SODIUM CHLORIDE 0.9 % IV SOLN: 250.0000 mL | INTRAVENOUS | Status: DC | PRN

## 2020-06-04 MED ORDER — NITROGLYCERIN IN D5W 200-5 MCG/ML-% IV SOLN
0.0000 ug/min | INTRAVENOUS | Status: DC
  Administered 2020-06-04: 5 ug/min via INTRAVENOUS
  Filled 2020-06-04: qty 250

## 2020-06-04 MED ORDER — NITROGLYCERIN 0.4 MG SL SUBL: 0.4000 mg | SUBLINGUAL_TABLET | SUBLINGUAL | Status: DC | PRN

## 2020-06-04 MED ORDER — EZETIMIBE 10 MG PO TABS: 10.0000 mg | ORAL_TABLET | Freq: Every day | ORAL | Status: DC

## 2020-06-04 MED ORDER — ONDANSETRON HCL 4 MG/2ML IJ SOLN: 4.0000 mg | Freq: Four times a day (QID) | INTRAMUSCULAR | Status: DC | PRN

## 2020-06-04 MED ORDER — ASPIRIN EC 81 MG PO TBEC: 81.0000 mg | DELAYED_RELEASE_TABLET | Freq: Every day | ORAL | Status: DC

## 2020-06-04 MED ORDER — SODIUM CHLORIDE 0.9 % WEIGHT BASED INFUSION
3.0000 mL/kg/h | INTRAVENOUS | Status: DC
  Administered 2020-06-05: 3 mL/kg/h via INTRAVENOUS

## 2020-06-04 MED ORDER — PAROXETINE HCL 20 MG PO TABS
20.0000 mg | ORAL_TABLET | Freq: Every day | ORAL | Status: DC
  Administered 2020-06-05: 20 mg via ORAL
  Filled 2020-06-04: qty 1

## 2020-06-04 MED ORDER — PANTOPRAZOLE SODIUM 40 MG PO TBEC
40.0000 mg | DELAYED_RELEASE_TABLET | Freq: Every day | ORAL | Status: DC
  Administered 2020-06-06: 40 mg via ORAL
  Filled 2020-06-04: qty 1

## 2020-06-04 MED ORDER — DOXYLAMINE SUCCINATE (SLEEP) 25 MG PO TABS
25.0000 mg | ORAL_TABLET | Freq: Every evening | ORAL | Status: DC | PRN
  Administered 2020-06-04 – 2020-06-05 (×2): 25 mg via ORAL
  Filled 2020-06-04 (×4): qty 1

## 2020-06-04 MED ORDER — HEPARIN (PORCINE) 25000 UT/250ML-% IV SOLN
1100.0000 [IU]/h | INTRAVENOUS | Status: DC
  Administered 2020-06-04: 1100 [IU]/h via INTRAVENOUS
  Filled 2020-06-04: qty 250

## 2020-06-04 MED ORDER — SODIUM CHLORIDE 0.9% FLUSH
3.0000 mL | INTRAVENOUS | Status: DC | PRN
  Administered 2020-06-04: 3 mL via INTRAVENOUS

## 2020-06-04 MED ORDER — ASPIRIN 81 MG PO CHEW
324.0000 mg | CHEWABLE_TABLET | Freq: Once | ORAL | Status: AC
  Administered 2020-06-04: 324 mg via ORAL
  Filled 2020-06-04: qty 4

## 2020-06-04 MED ORDER — SODIUM CHLORIDE 0.9% FLUSH
3.0000 mL | Freq: Two times a day (BID) | INTRAVENOUS | Status: DC
  Administered 2020-06-04 – 2020-06-06 (×3): 3 mL via INTRAVENOUS

## 2020-06-04 MED ORDER — SODIUM CHLORIDE 0.9 % WEIGHT BASED INFUSION
1.0000 mL/kg/h | INTRAVENOUS | Status: DC
  Administered 2020-06-05 (×2): 1 mL/kg/h via INTRAVENOUS

## 2020-06-04 MED ORDER — AMLODIPINE BESYLATE 10 MG PO TABS
10.0000 mg | ORAL_TABLET | Freq: Every day | ORAL | Status: DC
  Administered 2020-06-06: 10 mg via ORAL
  Filled 2020-06-04: qty 1

## 2020-06-04 MED ORDER — HEPARIN BOLUS VIA INFUSION
4000.0000 [IU] | Freq: Once | INTRAVENOUS | Status: AC
  Administered 2020-06-04: 4000 [IU] via INTRAVENOUS

## 2020-06-04 NOTE — Telephone Encounter (Signed)
Contacted patient and per wife, patient is currently at [facility] ED.

## 2020-06-04 NOTE — Telephone Encounter (Signed)
New message    Pt c/o of Chest Pain: STAT if CP now or developed within 24 hours  1. Are you having CP right now? yes  2. Are you experiencing any other symptoms (ex. SOB, nausea, vomiting, sweating)? Chest pain pressure nausea , left shoulder arm , sob   3. How long have you been experiencing CP? 3 day   4. Is your CP continuous or coming and going? Comes and goes   5. Have you taken Nitroglycerin? Wont take it , wont go to er  ?

## 2020-06-04 NOTE — Telephone Encounter (Signed)
Pt c/o of Chest Pain:  1. Are you having CP right now?   No - Tightness in chest - 2. Are you experiencing any other symptoms (ex. SOB, nausea, vomiting, sweating)?  Nausea off and on  3. How long have you been experiencing CP?  2 days  -states that he thought he had indigestion  4. Is your CP continuous or coming and going? Off and on  5. Have you taken Nitroglycerin? No   Patient's wife states that he has been taking a lot of indigestion medications.States that nothing is helping.

## 2020-06-04 NOTE — Progress Notes (Signed)
CRITICAL VALUE ALERT  Critical Value:  Troponin 2990   Date & Time Notied:  06/04/20 2150  Provider Notified: Vickki Muff  Orders Received/Actions taken: Awaiting  callback

## 2020-06-04 NOTE — ED Triage Notes (Signed)
Pt c/o of left sided cp that radiates to left arm x 3 days

## 2020-06-04 NOTE — ED Notes (Signed)
Called [facility] for transport to [facility]. 

## 2020-06-04 NOTE — Telephone Encounter (Signed)
Returned call - mailbox full

## 2020-06-04 NOTE — Progress Notes (Signed)
ANTICOAGULATION CONSULT NOTE - Initial Consult  Pharmacy Consult for heparin Indication: chest pain/ACS  Allergies  Allergen Reactions  . Nifedipine Other (See Comments)  . Lorazepam Other (See Comments)    REACTION: Alters mental status. "Turns into maniac" REACTION: Alters mental status. "Turns into maniac" REACTION: Alters mental status. "Turns into maniac"  . Other     REACTION: Unknown to patient. States that MD states allergy per medical records  . Penicillins Hives    Has patient had a PCN reaction causing immediate rash, facial/tongue/throat swelling, SOB or lightheadedness with hypotension: Yes Has patient had a PCN reaction causing severe rash involving mucus membranes or skin necrosis: No Has patient had a PCN reaction that required hospitalization No Has patient had a PCN reaction occurring within the last 10 years: No If all of the above answers are "NO", then may proceed with Cephalosporin use.  HAS TOLERATED: cephalexin  . Sulfacetamide     REACTION: Unknown to patient. States that MD states allergy per medical records  . Sulfonamide Derivatives Other (See Comments)    REACTION: Unknown to patient. States that MD states allergy per medical records  . Erythromycin Rash  . Statins Itching and Rash  . Vancomycin Itching    Patient Measurements: Height: 5\' 6"  (167.6 cm) Weight: 84.4 kg (186 lb) IBW/kg (Calculated) : 63.8 Heparin Dosing Weight: 81  Vital Signs: Temp: 97.9 F (36.6 C) (11/10 1329) Temp Source: Oral (11/10 1329) BP: 109/77 (11/10 1530) Pulse Rate: 57 (11/10 1530)  Labs: Recent Labs    06/04/20 1349  HGB 12.0*  HCT 39.1  PLT 226  CREATININE 1.45*  TROPONINIHS 1,929*    Estimated Creatinine Clearance: 40.7 mL/min (A) (by C-G formula based on SCr of 1.45 mg/dL (H)).   Medical History: Past Medical History:  Diagnosis Date  . Acute ST elevation myocardial infarction (STEMI) of inferior wall [facility]) 03/01/2020   [facility]  . Anxiety   . 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 ASCVD (arteriosclerotic cardiovascular disease)   . Chronic lower back pain    "have it at night" (05/04/2018)  . Coronary artery disease    a. 2003: s/p CABG x3V  b. 2007: cath with patent bypass grafts.  c. 09/2016: [place] with cath showing patent bypass grafts, medical Rx recommended  . CVA (cerebral vascular accident) ([place]) 10/2017   "little numb on my left face since" (05/04/2018)  . Depression   . GERD (gastroesophageal reflux disease)   . High triglycerides   . History of kidney stones   . Hypertension   . 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 Malodorous urine    "in the last 5wks; since I had trach put in" (05/04/2018)  . Peripheral vascular disease ([place])   . Stroke [facility])    "he's had several little strokes; many that he wasn't aware of" (05/04/2018)  . Tobacco abuse   . Tracheal stenosis     Medications:  (Not in a hospital admission)   Assessment: Pharmacy consulted to dose heparin in patient with chest pain/ACS and elevated troponin of 1929.  Patient is not on anticoagulation prior to admission.  Goal of Therapy:  Heparin level 0.3-0.7 units/ml Monitor platelets by anticoagulation protocol: Yes   Plan:  Give 4000 units bolus x 1 Start heparin infusion at 1100 units/hr Check anti-Xa level in 8 hours and daily while on heparin Continue to monitor H&H and platelets  Margot Ables, PharmD Clinical Pharmacist 06/04/2020 4:16 PM

## 2020-06-04 NOTE — ED Provider Notes (Signed)
[facility] EMERGENCY DEPARTMENT Provider Note   CSN: [id number] Arrival date & time: 06/04/20  1320     History Chief Complaint  Patient presents with  . Chest Pain    Kenneth [name] is a 79 y.o. male with history of CAD s/p inferior MI s/p DES to RCA August 2021 now on aspirin and Plavix, hyperlipidemia, hypertension, CAD left ear s/p stents, reflux, tracheostomy brought to the ED by EMS for relation of chest pain.  First noticed yesterday evening while he was on a golf cart going to count calves.  It happened again this morning after he walked to his car for his dentist appointment.  Called Dr [name] cardiologist office and nurse told him to come to the ER. Pain is left-sided along epigastrium, left chest with radiation to the left shoulder and arm.  It is a pressure feeling.  When it first began yesterday it was an 8/10.  On his way to the ED he took 1 nitroglycerin and his pain improved now 1/10.  Yesterday afternoon he had chicken nuggets from [place].  Later that evening he noticed the pain for the first time and thought it may be his acid reflux.  He took 4 different acid reflux medicines but states his chest pain did not improve.  This pain is somewhat reminiscent of his heart attack - same location but not as severe.  Denies associated nausea, vomiting, diaphoresis, shortness of breath, palpitations.  Reports chronic fatigue and shortness of breath with prolonged walking, reports more than 20 to 30 feet will cause him to be extremely fatigued and short of breath and has to sit down.  This has been on and off for a long time.  Has also noticed his left ankle swelling intermittently after prolonged standing.  Denies fever, cough.  No recent illnesses.  Has been compliant with all his medicines.   HPI     Past Medical History:  Diagnosis Date  . Acute ST elevation myocardial infarction (STEMI) of inferior wall [facility]) 03/01/2020   [facility]  . Anxiety   .  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 ASCVD (arteriosclerotic cardiovascular disease)   . Chronic lower back pain    "have it at night" (05/04/2018)  . Coronary artery disease    a. 2003: s/p CABG x3V  b. 2007: cath with patent bypass grafts.  c. 09/2016: [place] with cath showing patent bypass grafts, medical Rx recommended  . CVA (cerebral vascular accident) ([place]) 10/2017   "little numb on my left face since" (05/04/2018)  . Depression   . GERD (gastroesophageal reflux disease)   . High triglycerides   . History of kidney stones   . Hypertension   . 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 Malodorous urine    "in the last 5wks; since I had trach put in" (05/04/2018)  . Peripheral vascular disease ([place])   . Stroke [facility])    "he's had several little strokes; many that he wasn't aware of" (05/04/2018)  . Tobacco abuse   . Tracheal stenosis     Patient Active Problem List   Diagnosis Date Noted  . NSTEMI (non-ST elevated myocardial infarction) ([place]) 06/04/2020  . Pulmonary infiltrates 11/11/2018  . HCAP (healthcare-associated pneumonia) 10/14/2018  . Acute on chronic respiratory failure with hypoxia ([place]) 09/06/2018  . 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 Pneumonia due to infectious organism   . SOB (shortness of breath) 09/05/2018  . Carotid stenosis, symptomatic, with infarction ([place]) 05/04/2018  .  Ischemic stroke ([place]) 11/15/2017  . TIA (transient ischemic attack) 11/14/2017  . Constipation 09/15/2017  . Chest pain 10/06/2016  . Essential hypertension 10/06/2016  . 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 DOE (dyspnea on exertion) 12/05/2015  . 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 Aftercare following surgery of the circulatory system, [place] 08/03/2013  . Occlusion and stenosis of carotid artery without mention of cerebral infarction 07/21/2012  . Preop cardiovascular exam 05/08/2012  . Carotid stenosis, bilateral 05/05/2012  . CAD in native artery 08/13/2011  . Malignant neoplasm of larynx ([place])  12/14/2009  . HLD (hyperlipidemia) 12/14/2009  . CEREBROVASCULAR ACCIDENT 12/14/2009  . GASTROESOPHAGEAL REFLUX DISEASE 12/14/2009  . 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 CAROTID STENT Right 06-13-12; 05/04/2018  . CAROTID STENT INSERTION N/A 06/13/2012   Procedure: CAROTID STENT INSERTION;  Surgeon: Serafina Mitchell, MD;  Location: [facility] C[facility] LAB;  Service: Cardiovascular;  Laterality: N/A;  . CATARACT EXTRACTION, BILATERAL Bilateral   . COLONOSCOPY  11/10/2011   Dr. [name]: hemorrhoids, tubular adenoma  . CORNEAL TRANSPLANT Bilateral   . CORONARY ARTERY BYPASS GRAFT  ~ 2003   "CABG X3"  . EYE SURGERY    . INSERTION OF RETROGRADE CAROTID STENT Right 05/04/2018   Procedure: INSERTION OF RIGHT  CAROTID STENT using an ABBOT- XACT carotid stent system;  Surgeon: Serafina Mitchell, MD;  Location: [place];  Service: Vascular;  Laterality: Right;  . LAPAROSCOPIC CHOLECYSTECTOMY  2007  . LEFT HEART CATH AND CORS/GRAFTS ANGIOGRAPHY N/A 10/07/2016   Procedure: Left Heart Cath and Cors/Grafts Angiography;  Surgeon: Troy Sine, MD;  Location: [place] CV LAB;  Service: Cardiovascular;  Laterality: N/A;  . PARTIAL LARYNGECTOMY  1998  . TRACHEOSTOMY  03/13/2018   "@ [facility]"       Family History  Problem Relation Age of Onset  . Dementia Mother   . Heart disease Father   . Cancer Brother   . Heart disease Brother   . Hyperlipidemia Brother   . Cancer Brother   . Colon cancer Neg Hx     Social History   Tobacco Use  . Smoking status: Former Smoker    Packs/day: 1.00    Years: 15.00    Pack years: 15.00    Types: Cigarettes    Start date: 04/22/1953    Quit date: 07/26/1970    Years since quitting: 49.8  . Smokeless tobacco: Former Systems developer    Types: Chew    Quit date: 05/05/2002  Vaping Use  . Vaping Use: Never used  Substance Use Topics  . Alcohol use: Never    Alcohol/week: 0.0 standard drinks  . Drug use: Never    Home  Medications Prior to Admission medications   Medication Sig Start Date End Date Taking? Authorizing Provider  acetaminophen (TYLENOL) 500 MG tablet Take 500 mg by mouth every 6 (six) hours as needed.   Yes [provider]  albuterol (PROVENTIL) (2.5 MG/3ML) 0.083% nebulizer solution Take 3 mLs (2.5 mg total) by nebulization 3 (three) times daily. X 5 days and then q 6 hrs prn after that Patient taking differently: Take 2.5 mg by nebulization 3 (three) times daily. X 5 days and then every 6 hrs as needed after that 09/29/18  Yes Emokpae, Courage, MD  amLODipine (NORVASC) 10 MG tablet Take 1 tablet by mouth once daily 05/05/20  Yes Branch, Alphonse Guild, MD  aspirin EC 81 MG tablet Take 81 mg by mouth daily. Swallow whole.  Yes [provider]  clopidogrel (PLAVIX) 75 MG tablet Take 1 tablet by mouth once daily 02/13/20  Yes Branch, Alphonse Guild, MD  doxylamine, Sleep, (UNISOM) 25 MG tablet Take 25 mg by mouth at bedtime as needed.   Yes [provider]  ezetimibe (ZETIA) 10 MG tablet Take 1 tablet by mouth once daily 05/26/20  Yes Branch, Alphonse Guild, MD  losartan (COZAAR) 50 MG tablet Take 50 mg by mouth daily.   Yes [provider]  metoprolol tartrate (LOPRESSOR) 25 MG tablet Take 25 mg by mouth 2 (two) times daily.  03/16/17  Yes [provider]  nitroGLYCERIN (NITROSTAT) 0.4 MG SL tablet Place 1 tablet (0.4 mg total) under the tongue every 5 (five) minutes as needed for chest pain. 01/31/17 05/25/21 Yes Lendon Colonel, NP  omeprazole (PRILOSEC) 20 MG capsule TAKE 30-60 MINUTES BEFORE YOUR FIRST AND LAST MEALS OF THE DAY 02/13/20  Yes Tanda Rockers, MD  PARoxetine (PAXIL) 20 MG tablet Take 20 mg by mouth at bedtime.    Yes [provider]  acetaminophen-codeine (TYLENOL #3) 300-30 MG tablet Take 1 tablet by mouth every 4 (four) hours as needed for moderate pain. Patient not taking: Reported on 06/04/2020    [provider]   Dextromethorphan-guaiFENesin (TUSSIN DM MAX PO) Take by mouth. As directed as needed Patient not taking: Reported on 06/04/2020    [provider]  guaifenesin (HUMIBID E) 400 MG TABS tablet Take 400 mg by mouth every 4 (four) hours as needed. Patient not taking: Reported on 06/04/2020    [provider]    Allergies    Nifedipine, Lorazepam, Other, Penicillins, Sulfacetamide, Sulfonamide derivatives, Erythromycin, Statins, and Vancomycin  Review of Systems   Review of Systems  Respiratory: Positive for shortness of breath (chronic).   Cardiovascular: Positive for chest pain.  Hematological: Bruises/bleeds easily.  All other systems reviewed and are negative.   Physical Exam Updated Vital Signs BP 109/77   Pulse (!) 57   Temp 97.9 F (36.6 C) (Oral)   Resp (!) 21   Ht 5\' 6"  (1.676 m)   Wt 84.4 kg   SpO2 100%   BMI 30.02 kg/m   Physical Exam Constitutional:      Appearance: He is well-developed.     Comments: NAD. Non toxic.   HENT:     Head: Normocephalic and atraumatic.     Nose: Nose normal.  Eyes:     General: Lids are normal.     Conjunctiva/sclera: Conjunctivae normal.  Neck:     Trachea: Trachea normal.     Comments: Trachea midline.  Cardiovascular:     Rate and Rhythm: Normal rate and regular rhythm.     Pulses:          Radial pulses are 1+ on the right side and 1+ on the left side.       Dorsalis pedis pulses are 1+ on the right side and 1+ on the left side.     Heart sounds: Normal heart sounds, S1 normal and S2 normal.     Comments: No murmurs. No LE edema or calf tenderness.  Pulmonary:     Effort: Pulmonary effort is normal.     Breath sounds: Normal breath sounds.  Abdominal:     General: Bowel sounds are normal.     Palpations: Abdomen is soft.     Tenderness: There is no abdominal tenderness.     Comments: No epigastric or upper abdominal tenderness.  Musculoskeletal:     Cervical back: Normal range of motion.  Skin:     General: Skin is warm and dry.     Capillary Refill: Capillary refill takes less than 2 seconds.     Comments: No rash to chest wall  Neurological:     Mental Status: He is alert.     GCS: GCS eye subscore is 4. GCS verbal subscore is 5. GCS motor subscore is 6.     Comments: Sensation and strength intact in upper/lower extremities  Psychiatric:        Speech: Speech normal.        Behavior: Behavior normal.        Thought Content: Thought content normal.     ED Results / Procedures / Treatments   Labs (all labs ordered are listed, but only abnormal results are displayed) Labs Reviewed  BASIC METABOLIC PANEL - Abnormal; Notable for the following components:      Result Value   Glucose, Bld 134 (*)    Creatinine, Ser 1.45 (*)    Calcium 8.8 (*)    GFR, Estimated 48 (*)    All other components within normal limits  CBC - Abnormal; Notable for the following components:   RBC 4.10 (*)    Hemoglobin 12.0 (*)    All other components within normal limits  TROPONIN I (HIGH SENSITIVITY) - Abnormal; Notable for the following components:   Troponin I (High Sensitivity) 1,929 (*)    All other components within normal limits  RESPIRATORY PANEL BY RT PCR (FLU A&B, COVID)  HEPARIN LEVEL (UNFRACTIONATED)  CBG MONITORING, ED  TROPONIN I (HIGH SENSITIVITY)    EKG EKG Interpretation  Date/Time:  Wednesday June 04 2020 15:47:38 EST Ventricular Rate:  58 PR Interval:    QRS Duration: 137 QT Interval:  460 QTC Calculation: 452 R Axis:   17 Text Interpretation: Sinus rhythm Right bundle branch block Inferior infarct, age indeterminate Lateral leads are also involved no significant change since earlier in the day Confirmed by Sherwood Gambler [phone]) on 06/04/2020 3:53:04 PM   Radiology DG Chest 2 View  Result Date: 06/04/2020 CLINICAL DATA:  Left chest pain for 3 days, history of laryngeal cancer, previous tobacco abuse EXAM: CHEST - 2 VIEW COMPARISON:  10/12/2019 FINDINGS: Frontal  and lateral views of the chest demonstrate postsurgical changes from median sternotomy and tracheostomy. Cardiac silhouette is unremarkable. Chronic scarring throughout the lungs without airspace disease, effusion, or pneumothorax. No acute bony abnormalities. IMPRESSION: 1. Chronic areas of scarring.  No acute intrathoracic process. Electronically Signed   By: Randa Ngo M.D.   On: 06/04/2020 15:46    Procedures .Critical Care Performed by: Kinnie Feil, PA-C Authorized by: Kinnie Feil, PA-C   Critical care provider statement:    Critical care time (minutes):  45   Critical care was necessary to treat or prevent imminent or life-threatening deterioration of the following conditions: STEMI.   Critical care was time spent personally by me on the following activities:  Discussions with consultants, evaluation of patient's response to treatment, examination of patient, ordering and performing treatments and interventions, ordering and review of laboratory studies, ordering and review of radiographic studies, pulse oximetry, re-evaluation of patient's condition, obtaining history from patient or surrogate, review of old charts and development of treatment plan with patient or surrogate   I assumed direction of critical care for this patient from another provider in my specialty: no     (including critical care time)  Medications Ordered in ED Medications  aspirin chewable tablet 324 mg (has no administration in time range)  nitroGLYCERIN 50 mg in dextrose 5 % 250 mL (0.2 mg/mL) infusion (has no administration in time range)  heparin bolus via infusion 4,000 Units (has no administration in time range)  heparin ADULT infusion 100 units/mL (25000 units/277mL sodium chloride 0.45%) (has no administration in time range)  sodium chloride flush (NS) 0.9 % injection 3 mL (has no administration in time range)    ED Course  I have reviewed the triage vital signs and the nursing  notes.  Pertinent labs & imaging results that were available during my care of the patient were reviewed by me and considered in my medical decision making (see chart for details).  Clinical Course as of Jun 04 1650  Wed Jun 04, 2020  1355 Sinus rhythm Right bundle branch block Inferior infarct, age indeterminate Abnormal ECG Confirmed by Carmin Muskrat [phone]) on 06/04/2020 1:50:37 PM  ED EKG [CG]  1404 09/2016 cath  LM lesion, 50 %stenosed.  Prox LAD to Mid LAD lesion, 70 %stenosed.  Ost 1st Mrg lesion, 100 %stenosed.  RIMA and is normal in caliber and anatomically normal.  RPDA-2 lesion, 50 %stenosed.  RPDA-1 lesion, 60 %stenosed.  LIMA.  The left ventricular systolic function is normal.  LV end diastolic pressure is normal.  Normal LV function without focal segmental wall motion abnormalities.  Multivessel native CAD with 50% ostial stenosis of the LAD and diffuse 70% mid stenoses, occlusion of the OM1 vessel of the circumflex, and diffuse 60 and 50% PDA stenoses in the small caliber PDA vessel.  Patent sequential LIMA graft supplying a twin like diagonal and LAD system system.  Patent RIMA graft supplying the circumflex marginal vessel.  RECOMMENDATION: Medical therapy.   10/2017 echo Study Conclusions  - Left ventricle: The cavity size was normal. Wall thickness was normal. Systolic function was normal. The estimated ejection fraction was in the range of 60% to 65%. Wall motion was normal; there were no regional wall motion abnormalities. Left ventricular diastolic function parameters were normal. - Aortic valve: Mildly calcified annulus. Trileaflet; mildly thickened leaflets. Valve area (VTI): 3.01 cm^2. Valve area (Vmax): 2.24 cm^2. Valve area (Vmean): 2.3 cm^2. - Mitral valve: Mildly calcified annulus. Mildly thickened leaflets . - Left atrium: The atrium was mildly dilated.  12/2017 nuclear stress  No diagnostic ST segment  changes to indicate ischemia.  No significant myocardial perfusion defects to indicate scar or ischemia.  This is a low risk study.  Nuclear stress EF: 67%.   [CG]  1600 Troponin I (High Sensitivity)(!!): 1,929 [CG]  1600 Sinus rhythm Right bundle branch block Inferior infarct, age indeterminate Lateral leads are also involved no significant change since earlier in the day Confirmed by Sherwood Gambler [phone]) on 06/04/2020 3:53:04 PM  EKG 12-Lead [CG]  1600 IMPRESSION: 1. Chronic areas of scarring. No acute intrathoracic process.    DG Chest 2 View [CG]  1600 Creatinine(!): 1.45 [CG]  1600 Glucose(!): 134 [CG]  1600 GFR, Estimated(!): 48 [CG]  1600 Hemoglobin(!): 12.0 [CG]  1600 WBC: 9.5 [CG]  1615 Spoke to Dr. [name] with cardiology agrees with current ER treatment including heparin, aspirin, nitro drip.  Suspects will need heart cath tomorrow.  Will admit to cardiology service and transfer to [facility].   [CG]    Clinical Course User Index [CG] Arlean Hopping   MDM Rules/Calculators/A&P  79 year old male with known CAD with recent inferior MI August 2021 s/p DES to RCA on Plavix, aspirin, chronic dyspnea presents to the ED for what sounds like exertional left-sided chest pressure with radiation to the left shoulder/arm.  Somewhat reminiscent to his previous MI but not as severe.  History of severe acid reflux as well and had [employer] for dinner the afternoon before symptoms started.  No infectious symptoms.  No distal neuro or pulse deficits.  Has been compliant with acid reflux medicines and all her medicines including Plavix and aspirin.  EMR, triage nursing notes reviewed to assist with history and MDM  No access to [facility] documentation from his hospitalization for MI.  Cardiology note reviewed.  Patient has residual disease on the last cardiac catheterization report.  Echo showed normal LVEF 60 to 65%, mildly calcified AV.   Nuclear stress test 2019 low risk.  DDx: Unstable angina, STEMI, non-STEMI given exertional symptoms and known residual CAD.  He has significant acid reflux and could be contributing.  Clinical presentation is not consistent with dissection, but infectious process  Lab work ordered CBC, BMP, troponin x2.  Imaging ordered EKG, chest x-ray.  ER work-up personally visualized and interpreted  Lab work reviewed-troponin 1929.  Creatinine, GFR at their baseline.  Stable mild anemia.  Imaging reviewed-chest x-ray without acute pulmonary findings.  EKG shows sinus rhythm, right bundle branch block inferior infarct but unchanged.  1650: Patient reevaluated and reports continued mild chest discomfort.  Repeat EKG unchanged from the first 1.  Updated on ER work-up and plan to admit and cardiology consult.  Presentation consistent with NSTEMI.   Meds ordered: Heparin bolus and infusion per pharmacy, nitroglycerin for chest pain, aspirin 324 mg.  Spoke to Dr. [name] with cardiology who will admit patient to cardiology service anticipates left heart cath tomorrow, transfer to [facility]. Final Clinical Impression(s) / ED Diagnoses Final diagnoses:  Elevated troponin    Rx / DC Orders ED Discharge Orders    None       Kinnie Feil, PA-C 06/04/20 1651    Carmin Muskrat, MD 06/06/20 [phone]

## 2020-06-04 NOTE — H&P (Signed)
Cardiology Admission History and Physical:   Patient ID: [name] MRN: [id number]; DOB: [birth year]   Admission date: 06/04/2020  Primary Care Provider: [name], MD [facility] Cardiologist: Carlyle Dolly, MD  [facility] Electrophysiologist:  None   Chief Complaint:  Chest pain  Patient Profile:   Kenneth [name] is a 79 y.o. male with history of CAD presents with chest pain.   History of Present Illness:   [name] 79 yo male history of CAD. Prior CABG in 2003. Admitted to [facility] in [place] 02/2020 with inferior STEMI. Catho showed 80% LM, LAD 90% ostial followed by proximal 99%, LCX prox 80-90%, occluded OM1, RCA proximal 95% mid. The RPDA had multiple sequential 90% lesions Patent LIMA-LAD, no vein grafts visualized. RIMA to CX not mentioned in cath report.  Received DES to RCA. Echo at that time LVEF 60-65%. 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     Presents with left sided chest pain/pressure starting a few days. Intermittent, has been progressing since onset. Some associated SOB and fatigue. He reports strict medication compliance since his 02/2020 STEMI including DAPT   K 4.4 Cr 1.45 BUN 19 WBC 9.5 Hgb 12 Plt 226  hstrop 1929--> EKG SR, RBBB, inferior Qwaves, inferior and anterior TWIs chronic CXR no acute process  [facility] in [place] 02/2020 with inferior STEMI. Catho showed 80% LM, LAD 90% ostial followed by proximal 99%, LCX prox 80-90%, occluded OM1, RCA proximal 95% mid. The RPDA had multiple sequential 90% lesions Patent LIMA-LAD, no vein grafts visualized. RIMA to CX not mentioned in cath report.  Received DES to RCA. Echo at that time LVEF 60-65%    Past Medical History:  Diagnosis Date  . Acute ST elevation myocardial infarction (STEMI) of inferior wall [name]) 03/01/2020   [facility]  . Anxiety   .  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 ASCVD (arteriosclerotic cardiovascular disease)   . Chronic lower back pain    "have it at night" (05/04/2018)  . Coronary artery disease    a. 2003: s/p CABG x3V  b. 2007: cath with patent bypass grafts.  c. 09/2016: [place] with cath showing patent bypass grafts, medical Rx recommended  . CVA (cerebral vascular accident) ([place]) 10/2017   "little numb on my left face since" (05/04/2018)  . Depression   . GERD (gastroesophageal reflux disease)   . High triglycerides   . History of kidney stones   . Hypertension   . 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 Malodorous urine    "in the last 5wks; since I had trach put in" (05/04/2018)  . Peripheral vascular disease ([place])   . Stroke [facility])    "he's had several little strokes; many that he wasn't aware of" (05/04/2018)  . Tobacco abuse   . Tracheal stenosis     Past Surgical History:  Procedure Laterality Date  . CAROTID STENT Right 06-13-12; 05/04/2018  . CAROTID STENT INSERTION N/A 06/13/2012   Procedure: CAROTID STENT INSERTION;  Surgeon: Serafina Mitchell, MD;  Location: [facility] CATH LAB;  Service: Cardiovascular;  Laterality: N/A;  . CATARACT EXTRACTION, BILATERAL Bilateral   . COLONOSCOPY  11/10/2011   Dr. [name]: hemorrhoids, tubular adenoma  . CORNEAL TRANSPLANT Bilateral   . CORONARY ARTERY BYPASS GRAFT  ~ 2003   "CABG X3"  . EYE SURGERY    . INSERTION OF RETROGRADE CAROTID STENT Right 05/04/2018   Procedure:  INSERTION OF RIGHT  CAROTID STENT using an ABBOT- XACT carotid stent system;  Surgeon: Serafina Mitchell, MD;  Location: [place];  Service: Vascular;  Laterality: Right;  . LAPAROSCOPIC CHOLECYSTECTOMY  2007  . LEFT HEART CATH AND CORS/GRAFTS ANGIOGRAPHY N/A 10/07/2016   Procedure: Left Heart Cath and Cors/Grafts Angiography;  Surgeon: Troy Sine, MD;  Location: [facility] CV LAB;  Service: Cardiovascular;  Laterality: N/A;  . PARTIAL LARYNGECTOMY  1998  . TRACHEOSTOMY  03/13/2018   "@ [facility]"      Medications Prior to Admission: Prior to Admission medications   Medication Sig Start Date End Date Taking? Authorizing Provider  acetaminophen (TYLENOL) 500 MG tablet Take 500 mg by mouth every 6 (six) hours as needed.   Yes [provider]  albuterol (PROVENTIL) (2.5 MG/3ML) 0.083% nebulizer solution Take 3 mLs (2.5 mg total) by nebulization 3 (three) times daily. X 5 days and then q 6 hrs prn after that Patient taking differently: Take 2.5 mg by nebulization 3 (three) times daily. X 5 days and then every 6 hrs as needed after that 09/29/18  Yes Emokpae, Courage, MD  amLODipine (NORVASC) 10 MG tablet Take 1 tablet by mouth once daily 05/05/20  Yes Yan Okray, Alphonse Guild, MD  aspirin EC 81 MG tablet Take 81 mg by mouth daily. Swallow whole.   Yes [provider]  clopidogrel (PLAVIX) 75 MG tablet Take 1 tablet by mouth once daily 02/13/20  Yes Sarahanne Novakowski, Alphonse Guild, MD  doxylamine, Sleep, (UNISOM) 25 MG tablet Take 25 mg by mouth at bedtime as needed.   Yes [provider]  ezetimibe (ZETIA) 10 MG tablet Take 1 tablet by mouth once daily 05/26/20  Yes Amoura Ransier, Alphonse Guild, MD  losartan (COZAAR) 50 MG tablet Take 50 mg by mouth daily.   Yes [provider]  metoprolol tartrate (LOPRESSOR) 25 MG tablet Take 25 mg by mouth 2 (two) times daily.  03/16/17  Yes [provider]  nitroGLYCERIN (NITROSTAT) 0.4 MG SL tablet Place 1 tablet (0.4 mg total) under the tongue every 5 (five) minutes as needed for chest pain. 01/31/17 05/25/21 Yes Lendon Colonel, NP  omeprazole (PRILOSEC) 20 MG capsule TAKE 30-60 MINUTES BEFORE YOUR FIRST AND LAST MEALS OF THE DAY 02/13/20  Yes Tanda Rockers, MD  PARoxetine (PAXIL) 20 MG tablet Take 20 mg by mouth at bedtime.    Yes [provider]  acetaminophen-codeine (TYLENOL #3) 300-30 MG tablet Take 1 tablet by mouth every 4 (four) hours as needed for moderate pain. Patient not taking: Reported on 06/04/2020    [provider]  Dextromethorphan-guaiFENesin (TUSSIN DM MAX PO) Take by mouth. As directed as needed Patient not taking: Reported on 06/04/2020    [provider]  guaifenesin (HUMIBID E) 400 MG TABS tablet Take 400 mg by mouth every 4 (four) hours as needed. Patient not taking: Reported on 06/04/2020    [provider]     Allergies:    Allergies  Allergen Reactions  . Nifedipine Other (See Comments)  . Lorazepam Other (See Comments)    REACTION: Alters mental status. "Turns into maniac" REACTION: Alters mental status. "Turns into maniac" REACTION: Alters mental status. "Turns into maniac"  . Other     REACTION: Unknown to patient. States that MD states allergy per medical records  . Penicillins Hives    Has patient had a PCN reaction causing immediate rash, facial/tongue/throat swelling, SOB or lightheadedness with hypotension: Yes Has patient had a  PCN reaction causing severe rash involving mucus membranes or skin necrosis: No Has patient had a PCN reaction that required hospitalization No Has patient had a PCN reaction occurring within the last 10 years: No If all of the above answers are "NO", then may proceed with Cephalosporin use.  HAS TOLERATED: cephalexin  . Sulfacetamide     REACTION: Unknown to patient. States that MD states allergy per medical records  . Sulfonamide Derivatives Other (See Comments)    REACTION: Unknown to patient. States that MD states allergy per medical records  . Erythromycin Rash  . Statins Itching and Rash  . Vancomycin Itching    Social History:   Social History   Socioeconomic History  . Marital status: Married    Spouse name: Not on file  . Number of children: Not on file  . Years of education: Not on file  . Highest education level: Not on file  Occupational History  . Occupation: retired    Comment: Warden/ranger  Tobacco Use  . Smoking status: Former Smoker    Packs/day: 1.00    Years: 15.00    Pack years:  15.00    Types: Cigarettes    Start date: 04/22/1953    Quit date: 07/26/1970    Years since quitting: 49.8  . Smokeless tobacco: Former Systems developer    Types: Chew    Quit date: 05/05/2002  Vaping Use  . Vaping Use: Never used  Substance and Sexual Activity  . Alcohol use: Never    Alcohol/week: 0.0 standard drinks  . Drug use: Never  . Sexual activity: Not Currently  Other Topics Concern  . Not on file  Social History Narrative   Walks on the treadmill every other day at the [facility] for the last couple of weeks.         Social Determinants of Health   Financial Resource Strain:   . Difficulty of Paying Living Expenses: Not on file  Food Insecurity:   . Worried About Charity fundraiser in the Last Year: Not on file  . Ran Out of Food in the Last Year: Not on file  Transportation Needs:   . Lack of Transportation (Medical): Not on file  . Lack of Transportation (Non-Medical): Not on file  Physical Activity:   . Days of Exercise per Week: Not on file  . Minutes of Exercise per Session: Not on file  Stress:   . Feeling of Stress : Not on file  Social Connections:   . Frequency of Communication with Friends and Family: Not on file  . Frequency of Social Gatherings with Friends and Family: Not on file  . Attends Religious Services: Not on file  . Active Member of Clubs or Organizations: Not on file  . Attends Archivist Meetings: Not on file  . Marital Status: Not on file  Intimate Partner Violence:   . Fear of Current or Ex-Partner: Not on file  . Emotionally Abused: Not on file  . Physically Abused: Not on file  . Sexually Abused: Not on file    Family History:   The patient's family history includes Cancer in his brother and brother; Dementia in his mother; Heart disease in his brother and father; Hyperlipidemia in his brother. There is no history of Colon cancer.    ROS:  Please see the history of present illness.  All other ROS reviewed and negative.      Physical Exam/Data:   Vitals:   06/04/20 1329 06/04/20 1330  06/04/20 1430 06/04/20 1530  BP: 123/69  130/64 109/77  Pulse: 60  (!) 57 (!) 57  Resp: (!) 21  18 (!) 21  Temp: 97.9 F (36.6 C)     TempSrc: Oral     SpO2: 99%  96% 100%  Weight:  84.4 kg    Height:  5\' 6"  (1.676 m)     No intake or output data in the 24 hours ending 06/04/20 1615 Last 3 Weights 06/04/2020 03/17/2020 10/12/2019  Weight (lbs) 186 lb 186 lb 189 lb  Weight (kg) 84.369 kg 84.369 kg 85.73 kg     Body mass index is 30.02 kg/m.  General:  Well nourished, well developed, in no acute distress HEENT: normal Lymph: no adenopathy Neck: no JVD Endocrine:  No thryomegaly Vascular: No carotid bruits; FA pulses 2+ bilaterally without bruits  Cardiac:  normal S1, S2; RRR; no murmur Lungs:  clear to auscultation bilaterally, no wheezing, rhonchi or rales  Abd: soft, nontender, no hepatomegaly  Ext: no LE edema Musculoskeletal:  No deformities, BUE and BLE strength normal and equal Skin: warm and dry  Neuro:  CNs 2-12 intact, no focal abnormalities noted Psych:  Normal affect      Laboratory Data:  High Sensitivity Troponin:   Recent Labs  Lab 06/04/20 1349  TROPONINIHS 1,929*      Chemistry Recent Labs  Lab 06/04/20 1349  NA 136  K 4.4  CL 101  CO2 27  GLUCOSE 134*  BUN 19  CREATININE 1.45*  CALCIUM 8.8*  GFRNONAA 48*  ANIONGAP 8    No results for input(s): PROT, ALBUMIN, AST, ALT, ALKPHOS, BILITOT in the last 168 hours. Hematology Recent Labs  Lab 06/04/20 1349  WBC 9.5  RBC 4.10*  HGB 12.0*  HCT 39.1  MCV 95.4  MCH 29.3  MCHC 30.7  RDW 14.0  PLT 226   BNPNo results for input(s): BNP, PROBNP in the last 168 hours.  DDimer No results for input(s): DDIMER in the last 168 hours.   Radiology/Studies:  DG Chest 2 View  Result Date: 06/04/2020 CLINICAL DATA:  Left chest pain for 3 days, history of laryngeal cancer, previous tobacco abuse EXAM: CHEST - 2 VIEW COMPARISON:   10/12/2019 FINDINGS: Frontal and lateral views of the chest demonstrate postsurgical changes from median sternotomy and tracheostomy. Cardiac silhouette is unremarkable. Chronic scarring throughout the lungs without airspace disease, effusion, or pneumothorax. No acute bony abnormalities. IMPRESSION: 1. Chronic areas of scarring.  No acute intrathoracic process. Electronically Signed   By: Randa Ngo M.D.   On: 06/04/2020 15:46     Assessment and Plan:   1. CAD/NSTEMI - CAD history as reported above, including recent STEMI 02/2020 [facility] receiving a DES to RCA. Prior CABG in 2003 - presents with chest pains consistent with progressing angina, chronic EKG changes, significant troponin to 1900  - medical therapy with ASA 81, plavix 75, hep gtt, losartan 50, metoprolol 25mg  bid, Statin allegy, has been on zetia. Started on NG gtt in ER. Currently pain 3/10 in severity, NG drip to start soon, hemodynamically stable in no distress.   Plan for transfer to [facility] will need cath tomorrow   I have reviewed the risks, indications, and alternatives to cardiac catheterization, possible angioplasty, and stenting with the patient  today. Risks include but are not limited to bleeding, infection, vascular injury, stroke, myocardial infection, arrhythmia, kidney injury, radiation-related injury in the case of prolonged fluoroscopy use, emergency cardiac surgery, and death. The  patient understands the risks of serious complication is 1-2 in 9012 with diagnostic cardiac cath and 1-2% or less with angioplasty/stenting.    For questions or updates, please contact [place] Please consult [email or web address] for contact info under     Signed, Carlyle Dolly, MD  06/04/2020 4:15 PM

## 2020-06-04 NOTE — Telephone Encounter (Signed)
Pt states that he is weak. He has SOB that increases with walking. Chest pain is worse with walking. Pt states that he has some relief with resting. Pt instructed to take a nitro while on the phone and he did so. He reported not relief at that time. Pt agreed to be seen at the ER. Please advise.

## 2020-06-05 ENCOUNTER — Encounter (HOSPITAL_COMMUNITY): Payer: Self-pay | Admitting: Cardiovascular Disease

## 2020-06-05 ENCOUNTER — Inpatient Hospital Stay (HOSPITAL_COMMUNITY): Payer: Medicare HMO

## 2020-06-05 ENCOUNTER — Inpatient Hospital Stay (HOSPITAL_COMMUNITY)
Admission: EM | Disposition: A | Discharge status: Home / Self Care | Payer: Self-pay | Source: Home / Self Care | Attending: Cardiology

## 2020-06-05 DIAGNOSIS — I214 Non-ST elevation (NSTEMI) myocardial infarction: Secondary | ICD-10-CM

## 2020-06-05 DIAGNOSIS — F32A Depression, unspecified: Secondary | ICD-10-CM | POA: Diagnosis present

## 2020-06-05 DIAGNOSIS — R778 Other specified abnormalities of plasma proteins: Secondary | ICD-10-CM | POA: Diagnosis present

## 2020-06-05 DIAGNOSIS — E781 Pure hyperglyceridemia: Secondary | ICD-10-CM | POA: Diagnosis not present

## 2020-06-05 DIAGNOSIS — E785 Hyperlipidemia, unspecified: Secondary | ICD-10-CM | POA: Diagnosis not present

## 2020-06-05 DIAGNOSIS — E1122 Type 2 diabetes mellitus with diabetic chronic kidney disease: Secondary | ICD-10-CM | POA: Diagnosis present

## 2020-06-05 DIAGNOSIS — E1151 Type 2 diabetes mellitus with diabetic peripheral angiopathy without gangrene: Secondary | ICD-10-CM | POA: Diagnosis present

## 2020-06-05 DIAGNOSIS — Z923 Personal history of irradiation: Secondary | ICD-10-CM | POA: Diagnosis not present

## 2020-06-05 DIAGNOSIS — I129 Hypertensive chronic kidney disease with stage 1 through stage 4 chronic kidney disease, or unspecified chronic kidney disease: Secondary | ICD-10-CM | POA: Diagnosis not present

## 2020-06-05 DIAGNOSIS — Z79899 Other long term (current) drug therapy: Secondary | ICD-10-CM | POA: Diagnosis not present

## 2020-06-05 DIAGNOSIS — F419 Anxiety disorder, unspecified: Secondary | ICD-10-CM | POA: Diagnosis present

## 2020-06-05 DIAGNOSIS — Z951 Presence of aortocoronary bypass graft: Secondary | ICD-10-CM | POA: Diagnosis not present

## 2020-06-05 DIAGNOSIS — I2510 Atherosclerotic heart disease of native coronary artery without angina pectoris: Secondary | ICD-10-CM

## 2020-06-05 DIAGNOSIS — I25119 Atherosclerotic heart disease of native coronary artery with unspecified angina pectoris: Secondary | ICD-10-CM | POA: Diagnosis not present

## 2020-06-05 DIAGNOSIS — Z8673 Personal history of transient ischemic attack (TIA), and cerebral infarction without residual deficits: Secondary | ICD-10-CM | POA: Diagnosis not present

## 2020-06-05 DIAGNOSIS — Z7902 Long term (current) use of antithrombotics/antiplatelets: Secondary | ICD-10-CM | POA: Diagnosis not present

## 2020-06-05 DIAGNOSIS — Z880 Allergy status to penicillin: Secondary | ICD-10-CM | POA: Diagnosis not present

## 2020-06-05 DIAGNOSIS — K219 Gastro-esophageal reflux disease without esophagitis: Secondary | ICD-10-CM | POA: Diagnosis present

## 2020-06-05 DIAGNOSIS — I252 Old myocardial infarction: Secondary | ICD-10-CM | POA: Diagnosis not present

## 2020-06-05 DIAGNOSIS — Z9841 Cataract extraction status, right eye: Secondary | ICD-10-CM | POA: Diagnosis not present

## 2020-06-05 DIAGNOSIS — Z7982 Long term (current) use of aspirin: Secondary | ICD-10-CM | POA: Diagnosis not present

## 2020-06-05 DIAGNOSIS — I255 Ischemic cardiomyopathy: Secondary | ICD-10-CM | POA: Diagnosis not present

## 2020-06-05 DIAGNOSIS — I6523 Occlusion and stenosis of bilateral carotid arteries: Secondary | ICD-10-CM | POA: Diagnosis not present

## 2020-06-05 DIAGNOSIS — Z20822 Contact with and (suspected) exposure to covid-19: Secondary | ICD-10-CM | POA: Diagnosis not present

## 2020-06-05 DIAGNOSIS — Z947 Corneal transplant status: Secondary | ICD-10-CM | POA: Diagnosis not present

## 2020-06-05 DIAGNOSIS — Z9842 Cataract extraction status, left eye: Secondary | ICD-10-CM | POA: Diagnosis not present

## 2020-06-05 HISTORY — PX: CORONARY STENT INTERVENTION: CATH118234

## 2020-06-05 HISTORY — PX: LEFT HEART CATH AND CORS/GRAFTS ANGIOGRAPHY: CATH118250

## 2020-06-05 LAB — BASIC METABOLIC PANEL
Anion gap: 11 (ref 5–15)
BUN: 16 mg/dL (ref 8–23)
CO2: 25 mmol/L (ref 22–32)
Calcium: 8.8 mg/dL — ABNORMAL LOW (ref 8.9–10.3)
Chloride: 102 mmol/L (ref 98–111)
Creatinine, Ser: 1.59 mg/dL — ABNORMAL HIGH (ref 0.61–1.24)
GFR, Estimated: 43 mL/min — ABNORMAL LOW (ref >60)
Glucose, Bld: 118 mg/dL — ABNORMAL HIGH (ref 70–99)
Potassium: 3.8 mmol/L (ref 3.5–5.1)
Sodium: 138 mmol/L (ref 135–145)

## 2020-06-05 LAB — ECHOCARDIOGRAM COMPLETE
Area-P 1/2: 3.77 cm2
Height: 66 in
S' Lateral: 3 cm
Weight: 2878.4 oz

## 2020-06-05 LAB — POCT ACTIVATED CLOTTING TIME
Activated Clotting Time: 241 seconds
Activated Clotting Time: 263 seconds
Activated Clotting Time: 213 seconds
Activated Clotting Time: 186 seconds

## 2020-06-05 LAB — HEMOGLOBIN A1C
Hgb A1c MFr Bld: 6.2 % — ABNORMAL HIGH (ref 4.8–5.6)
Mean Plasma Glucose: 131.24 mg/dL

## 2020-06-05 LAB — HEPARIN LEVEL (UNFRACTIONATED)
Heparin Unfractionated: 0.45 IU/mL (ref 0.30–0.70)
Heparin Unfractionated: 0.35 IU/mL (ref 0.30–0.70)

## 2020-06-05 LAB — CBC
HCT: 36.8 % — ABNORMAL LOW (ref 39.0–52.0)
Hemoglobin: 11.8 g/dL — ABNORMAL LOW (ref 13.0–17.0)
MCH: 29.9 pg (ref 26.0–34.0)
MCHC: 32.1 g/dL (ref 30.0–36.0)
MCV: 93.4 fL (ref 80.0–100.0)
Platelets: 216 10*3/uL (ref 150–400)
RBC: 3.94 MIL/uL — ABNORMAL LOW (ref 4.22–5.81)
RDW: 14.1 % (ref 11.5–15.5)
WBC: 8.4 10*3/uL (ref 4.0–10.5)
nRBC: 0 % (ref 0.0–0.2)

## 2020-06-05 LAB — LIPID PANEL
Cholesterol: 150 mg/dL (ref 0–200)
HDL: 28 mg/dL — ABNORMAL LOW (ref >40)
LDL Cholesterol: 74 mg/dL (ref 0–99)
Total CHOL/HDL Ratio: 5.4 RATIO
Triglycerides: 240 mg/dL — ABNORMAL HIGH (ref <150)
VLDL: 48 mg/dL — ABNORMAL HIGH (ref 0–40)

## 2020-06-05 LAB — SARS CORONAVIRUS 2 BY RT PCR (HOSPITAL ORDER, PERFORMED IN ~~LOC~~ HOSPITAL LAB): SARS Coronavirus 2: NEGATIVE

## 2020-06-05 LAB — TROPONIN I (HIGH SENSITIVITY): Troponin I (High Sensitivity): 2621 ng/L (ref <18)

## 2020-06-05 SURGERY — LEFT HEART CATH AND CORS/GRAFTS ANGIOGRAPHY
Anesthesia: LOCAL

## 2020-06-05 MED ORDER — VERAPAMIL HCL 2.5 MG/ML IV SOLN
INTRAVENOUS | Status: AC
  Filled 2020-06-05: qty 2

## 2020-06-05 MED ORDER — MENTHOL 3 MG MT LOZG
1.0000 | LOZENGE | OROMUCOSAL | Status: DC | PRN
  Administered 2020-06-05 – 2020-06-06 (×2): 3 mg via ORAL
  Filled 2020-06-05: qty 9

## 2020-06-05 MED ORDER — FENTANYL CITRATE (PF) 100 MCG/2ML IJ SOLN
INTRAMUSCULAR | Status: DC | PRN
  Administered 2020-06-05: 25 ug via INTRAVENOUS

## 2020-06-05 MED ORDER — ACETAMINOPHEN 325 MG PO TABS: 650.0000 mg | ORAL_TABLET | ORAL | Status: DC | PRN

## 2020-06-05 MED ORDER — CLOPIDOGREL BISULFATE 300 MG PO TABS
ORAL_TABLET | ORAL | Status: DC | PRN
  Administered 2020-06-05: 300 mg via ORAL

## 2020-06-05 MED ORDER — HEPARIN SODIUM (PORCINE) 1000 UNIT/ML IJ SOLN
INTRAMUSCULAR | Status: AC
  Filled 2020-06-05: qty 1

## 2020-06-05 MED ORDER — SODIUM CHLORIDE 0.9 % IV SOLN: 250.0000 mL | INTRAVENOUS | Status: DC | PRN

## 2020-06-05 MED ORDER — MIDAZOLAM HCL 2 MG/2ML IJ SOLN
INTRAMUSCULAR | Status: AC
  Filled 2020-06-05: qty 2

## 2020-06-05 MED ORDER — HEPARIN SODIUM (PORCINE) 1000 UNIT/ML IJ SOLN
INTRAMUSCULAR | Status: DC | PRN
  Administered 2020-06-05: 9000 [IU] via INTRAVENOUS
  Administered 2020-06-05: 3000 [IU] via INTRAVENOUS

## 2020-06-05 MED ORDER — LABETALOL HCL 5 MG/ML IV SOLN: 10.0000 mg | INTRAVENOUS | Status: AC | PRN

## 2020-06-05 MED ORDER — ONDANSETRON HCL 4 MG/2ML IJ SOLN: 4.0000 mg | Freq: Four times a day (QID) | INTRAMUSCULAR | Status: DC | PRN

## 2020-06-05 MED ORDER — IOHEXOL 350 MG/ML SOLN
INTRAVENOUS | Status: DC | PRN
  Administered 2020-06-05: 150 mL

## 2020-06-05 MED ORDER — FENTANYL CITRATE (PF) 100 MCG/2ML IJ SOLN
INTRAMUSCULAR | Status: AC
  Filled 2020-06-05: qty 2

## 2020-06-05 MED ORDER — SODIUM CHLORIDE 0.9% FLUSH
3.0000 mL | Freq: Two times a day (BID) | INTRAVENOUS | Status: DC
  Administered 2020-06-05 – 2020-06-06 (×3): 3 mL via INTRAVENOUS

## 2020-06-05 MED ORDER — LIDOCAINE HCL (PF) 1 % IJ SOLN
INTRAMUSCULAR | Status: DC | PRN
  Administered 2020-06-05: 15 mL

## 2020-06-05 MED ORDER — LIDOCAINE HCL (PF) 1 % IJ SOLN
INTRAMUSCULAR | Status: AC
  Filled 2020-06-05: qty 30

## 2020-06-05 MED ORDER — SODIUM CHLORIDE 0.9 % IV SOLN
INTRAVENOUS | Status: AC | PRN
  Administered 2020-06-05: 250 mL via INTRAVENOUS

## 2020-06-05 MED ORDER — HEPARIN (PORCINE) IN NACL 1000-0.9 UT/500ML-% IV SOLN
INTRAVENOUS | Status: DC | PRN
  Administered 2020-06-05 (×2): 500 mL

## 2020-06-05 MED ORDER — IOHEXOL 350 MG/ML SOLN
INTRAVENOUS | Status: AC
  Filled 2020-06-05: qty 1

## 2020-06-05 MED ORDER — SODIUM CHLORIDE 0.9 % IV SOLN: INTRAVENOUS | Status: AC

## 2020-06-05 MED ORDER — SODIUM CHLORIDE 0.9% FLUSH: 3.0000 mL | INTRAVENOUS | Status: DC | PRN

## 2020-06-05 MED ORDER — MORPHINE SULFATE (PF) 2 MG/ML IV SOLN: 2.0000 mg | INTRAVENOUS | Status: DC | PRN

## 2020-06-05 MED ORDER — ASPIRIN 81 MG PO CHEW
81.0000 mg | CHEWABLE_TABLET | Freq: Every day | ORAL | Status: DC
  Administered 2020-06-06: 81 mg via ORAL
  Filled 2020-06-05: qty 1

## 2020-06-05 MED ORDER — MIDAZOLAM HCL 2 MG/2ML IJ SOLN
INTRAMUSCULAR | Status: DC | PRN
  Administered 2020-06-05: 1 mg via INTRAVENOUS

## 2020-06-05 MED ORDER — NITROGLYCERIN 1 MG/10 ML FOR IR/CATH LAB
INTRA_ARTERIAL | Status: AC
  Filled 2020-06-05: qty 10

## 2020-06-05 MED ORDER — HYDRALAZINE HCL 20 MG/ML IJ SOLN: 10.0000 mg | INTRAMUSCULAR | Status: AC | PRN

## 2020-06-05 MED ORDER — CLOPIDOGREL BISULFATE 75 MG PO TABS
75.0000 mg | ORAL_TABLET | Freq: Every day | ORAL | Status: DC
  Administered 2020-06-06: 75 mg via ORAL
  Filled 2020-06-05: qty 1

## 2020-06-05 MED ORDER — HEPARIN (PORCINE) IN NACL 1000-0.9 UT/500ML-% IV SOLN
INTRAVENOUS | Status: AC
  Filled 2020-06-05: qty 1000

## 2020-06-05 MED ORDER — CLOPIDOGREL BISULFATE 300 MG PO TABS
ORAL_TABLET | ORAL | Status: AC
  Filled 2020-06-05: qty 1

## 2020-06-05 SURGICAL SUPPLY — 20 items
BALLN SAPPHIRE 2.0X12 (BALLOONS) ×2
BALLN SAPPHIRE ~~LOC~~ 3.0X12 (BALLOONS) ×1 IMPLANT
BALLOON SAPPHIRE 2.0X12 (BALLOONS) IMPLANT
CATH INFINITI 5FR ANG PIGTAIL (CATHETERS) ×1 IMPLANT
CATH INFINITI 5FR MULTPACK ANG (CATHETERS) ×1 IMPLANT
CATH LAUNCHER 5F NOTO (CATHETERS) IMPLANT
CATH VISTA GUIDE 6FR JR4 (CATHETERS) ×1 IMPLANT
CATHETER LAUNCHER 5F NOTO (CATHETERS) ×2
KIT ENCORE 26 ADVANTAGE (KITS) ×1 IMPLANT
KIT HEART LEFT (KITS) ×2 IMPLANT
PACK CARDIAC CATHETERIZATION (CUSTOM PROCEDURE TRAY) ×2 IMPLANT
SHEATH PINNACLE 5F 10CM (SHEATH) ×1 IMPLANT
SHEATH PINNACLE 6F 10CM (SHEATH) ×2 IMPLANT
STENT RESOLUTE ONYX 2.75X15 (Permanent Stent) ×1 IMPLANT
TRANSDUCER W/STOPCOCK (MISCELLANEOUS) ×2 IMPLANT
TUBING CIL FLEX 10 FLL-RA (TUBING) ×2 IMPLANT
WIRE ASAHI PROWATER 180CM (WIRE) ×1 IMPLANT
WIRE EMERALD 3MM-J .035X150CM (WIRE) ×1 IMPLANT
WIRE EMERALD 3MM-J .035X260CM (WIRE) ×1 IMPLANT
WIRE HI TORQ VERSACORE-J 145CM (WIRE) ×1 IMPLANT

## 2020-06-05 NOTE — Progress Notes (Signed)
  Echocardiogram 2D Echocardiogram has been performed.  Kenneth [name] 06/05/2020, 2:47 PM

## 2020-06-05 NOTE — Discharge Instructions (Signed)

## 2020-06-05 NOTE — Interval H&P Note (Signed)
Cath Lab Visit (complete for each Cath Lab visit)  Clinical Evaluation Leading to the Procedure:   ACS: Yes.    Non-ACS:    Anginal Classification: CCS III  Anti-ischemic medical therapy: Maximal Therapy (2 or more classes of medications)  Non-Invasive Test Results: No non-invasive testing performed  Prior CABG: Previous CABG      History and Physical Interval Note:  06/05/2020 8:42 AM  Kenneth [name]  has presented today for surgery, with the diagnosis of nonstemi.  The various methods of treatment have been discussed with the patient and family. After consideration of risks, benefits and other options for treatment, the patient has consented to  Procedure(s): LEFT HEART CATH AND CORS/GRAFTS ANGIOGRAPHY (N/A) as a surgical intervention.  The patient's history has been reviewed, patient examined, no change in status, stable for surgery.  I have reviewed the patient's chart and labs.  Questions were answered to the patient's satisfaction.     Quay Burow

## 2020-06-05 NOTE — Progress Notes (Signed)
ANTICOAGULATION CONSULT NOTE - Follow Up Consult  Pharmacy Consult for heparin Indication: NSTEMI  Labs: Recent Labs    06/04/20 1349 06/04/20 1602 06/04/20 2111 06/04/20 2218 06/05/20 0023  HGB 12.0*  --   --   --  11.8*  HCT 39.1  --   --   --  36.8*  PLT 226  --   --   --  216  HEPARINUNFRC  --   --   --   --  0.45  CREATININE 1.45*  --   --   --  1.59*  TROPONINIHS 1,929*   < > 2,990* 2,596* 2,621*   < > = values in this interval not displayed.    Assessment/Plan:  79yo male therapeutic on heparin with initial dosing for NSTEMI. Will continue gtt at current rate and confirm stable with additional level.   Wynona Neat, PharmD, BCPS  06/05/2020,1:37 AM

## 2020-06-05 NOTE — Progress Notes (Signed)
Progress Note  Patient Name: Kenneth [name] Date of Encounter: 06/05/2020  Primary Cardiologist: Carlyle Dolly, MD   Subjective   Patient is s/p PCI and resting comfortably in bed. He denies chest pain, shortness of breath or any other symptom. He states that he tolerated the procedure well without issue.  Inpatient Medications    Scheduled Meds: . [MAR Hold] amLODipine  10 mg Oral Daily  . [MAR Hold] aspirin EC  81 mg Oral Daily  . [MAR Hold] clopidogrel  75 mg Oral Daily  . [MAR Hold] ezetimibe  10 mg Oral Daily  . [MAR Hold] metoprolol tartrate  25 mg Oral BID  . [MAR Hold] pantoprazole  40 mg Oral Daily  . [MAR Hold] PARoxetine  20 mg Oral QHS  . [MAR Hold] sodium chloride flush  3 mL Intravenous Q12H   Continuous Infusions: . sodium chloride    . sodium chloride 1 mL/kg/hr (06/05/20 0513)  . heparin 1,100 Units/hr (06/05/20 0518)  . [MAR Hold] nitroGLYCERIN Stopped (06/05/20 0906)   PRN Meds: sodium chloride, [MAR Hold] acetaminophen, [MAR Hold] doxylamine (Sleep), [MAR Hold] nitroGLYCERIN, [MAR Hold] ondansetron (ZOFRAN) IV, sodium chloride flush   Vital Signs    Vitals:   06/05/20 0951 06/05/20 0955 06/05/20 1005 06/05/20 1010  BP: 132/75 128/63 (!) 125/59 114/65  Pulse: (!) 0 (!) 51 (!) 52 (!) 52  Resp: _0 Temp:      TempSrc:      SpO2: (!) 0% 94% 97% 97%  Weight:      Height:        Intake/Output Summary (Last 24 hours) at 06/05/2020 1026 Last data filed at 06/05/2020 0518 Gross per 24 hour  Intake 628.57 ml  Output 700 ml  Net -71.43 ml   Filed Weights   06/04/20 1330 06/04/20 2022 06/05/20 0200  Weight: 84.4 kg 81.7 kg 81.6 kg    Telemetry    Not reviewed - Personally Reviewed  ECG    Not performed - Personally Reviewed  Physical Exam   GEN: No acute distress.   Neck: No JVD Cardiac: RRR, no murmurs, rubs, or gallops.  Respiratory: Clear to auscultation bilaterally. GI: Soft, nontender, non-distended  MS: No edema;  No deformity. Skin: skin is clear. Sheath remains in place without surrounding edema, erythema or rash. Neuro:  Nonfocal  Psych: Normal affect   Labs    Chemistry Recent Labs  Lab 06/04/20 1349 06/05/20 0023  NA 136 138  K 4.4 3.8  CL 101 102  CO2 27 25  GLUCOSE 134* 118*  BUN 19 16  CREATININE 1.45* 1.59*  CALCIUM 8.8* 8.8*  GFRNONAA 48* 43*  ANIONGAP 8 11     Hematology Recent Labs  Lab 06/04/20 1349 06/05/20 0023  WBC 9.5 8.4  RBC 4.10* 3.94*  HGB 12.0* 11.8*  HCT 39.1 36.8*  MCV 95.4 93.4  MCH 29.3 29.9  MCHC 30.7 32.1  RDW 14.0 14.1  PLT 226 216    Cardiac EnzymesNo results for input(s): TROPONINI in the last 168 hours. No results for input(s): TROPIPOC in the last 168 hours.   BNPNo results for input(s): BNP, PROBNP in the last 168 hours.   DDimer No results for input(s): DDIMER in the last 168 hours.   Radiology    DG Chest 2 View  Result Date: 06/04/2020 CLINICAL DATA:  Left chest pain for 3 days, history of laryngeal cancer, previous tobacco abuse EXAM: CHEST - 2 VIEW COMPARISON:  10/12/2019 FINDINGS:  Frontal and lateral views of the chest demonstrate postsurgical changes from median sternotomy and tracheostomy. Cardiac silhouette is unremarkable. Chronic scarring throughout the lungs without airspace disease, effusion, or pneumothorax. No acute bony abnormalities. IMPRESSION: 1. Chronic areas of scarring.  No acute intrathoracic process. Electronically Signed   By: Randa Ngo M.D.   On: 06/04/2020 15:46   CARDIAC CATHETERIZATION  Result Date: 06/05/2020  Ost LM to Mid LM lesion is 95% stenosed.  Ost Cx to Prox Cx lesion is 99% stenosed.  Prox LAD lesion is 90% stenosed.  Previously placed Prox RCA to Mid RCA stent (unknown type) is widely patent.  Ost RCA to Prox RCA lesion is 95% stenosed.  RPDA-1 lesion is 70% stenosed.  RPDA-2 lesion is 70% stenosed.  Kenneth [name] is a 79 y.o. male  [id number] LOCATION:  FACILITY: [facility] PHYSICIAN:  Quay Burow, M.D. 03-27-1941 DATE OF PROCEDURE:  06/05/2020 DATE OF DISCHARGE: CARDIAC CATHETERIZATION / PCI DES RCA History obtained from chart review.Kenneth [name] 79 yo male history of CAD. Prior CABG in 2003. Admitted to [facility] in [place] 02/2020 with inferior STEMI. Catho showed 80% LM, LAD 90% ostial followed by proximal 99%, LCX prox 80-90%, occluded OM1, RCA proximal 95% mid. The RPDA had multiple sequential 90% lesions Patent LIMA-LAD, no vein grafts visualized. RIMA to CX not mentioned in cath report.  Received DES to RCA. Echo at that time LVEF 60-65%. 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  He was admitted with chest pain.  He had a right bundle branch block.  His troponins rose to over 2000.  Placed on IV heparin.  Presents now for diagnostic coronary angiography. PROCEDURE DESCRIPTION: The patient was brought to the second floor [place] Cardiac cath lab in the postabsorptive state. He was premedicated with IV Versed and fentanyl. His right groinwas prepped and shaved in usual sterile fashion. Xylocaine 1% was used for local anesthesia. A 5 French sheath was inserted into the right common femoral  artery using standard Seldinger technique.  Ultrasound was used to identify the right common femoral artery and guide access.  A digital image was captured and placed in the patient's chart.  5 French right left Judkins diagnostic catheters on 5 French pigtail catheter and no torque guide catheter was used for selective coronary angiography, selective IMA graft angiography and left heart pressures.  Isovue dye was used for the entirety of the case.  Retrograde aorta, ventricular and pullback pressures were recorded. Patient received a total of 12,000 as of heparin with an ACT of approximately 270.  He received an additional 300 mg of p.o. Plavix and was already on aspirin and Plavix prior to  admission.  Isovue dye was used for the entirety of the intervention.  Retroaortic pressures monitored in the case. Using a 6 [place] JR4 guide catheter, 0.14/180 cm [place] and a 2 mm x 12 mm balloon the proximal RCA was easily wired and predilated.  Following this a 2.75 mm x 15 mm long Medtronic resolute Onyx drug-eluting stent was then carefully positioned across the proximal RCA across the diseased segment and deployed at 14 atm.  It was postdilated with a 3 mm x 12 mm noncompliant balloon at 14 atm (3.1 mm) resulting in reduction of a 95% ulcerated proximal dominant RCA stenosis to 0% residual.  Patient tolerated procedure well.  The guidewire and catheter were removed.  The sheath was secured.   Successful proximal RCA  PCI drug-eluting stenting in the setting of non-STEMI.  The previously placed stent in the mid RCA was widely patent.  This was placed in [place] in August of this year.  The sequential LIMA to the diagonal and LAD was patent.  The RIMA was not well visualized.  His LVEDP was 21.  The sheath will be removed once ACT falls below 170 and pressure held.  Patient be hydrated overnight, discharged home in the morning.  He left the lab in stable condition. Quay Burow. MD, [facility] 06/05/2020 9:59 AM    Cardiac Studies   Left Heart Cath: (02/2020) - inferior STEMI on EKG - 80% LM - LAD 90% ostial, 99% proximal - LCX prox 80-90% - Occluded OM1 - RCA prox 95% mid - RPDA multi sequential 90% lesions - Patent LIMA-LAD (no vein grafts visualized) - RIMA to CX not mentioned in cath report - Received DES to RCA - Echo showed LVEF 60-65%  Left Heart Cath: (06/05/20)  Ost LM to Mid LM lesion is 95% stenosed.  Ost Cx to Prox Cx lesion is 99% stenosed.  Prox LAD lesion is 90% stenosed.  Previously placed Prox RCA to Mid RCA stent (unknown type) is widely patent.  Ost RCA to Prox RCA lesion is 95% stenosed.  RPDA-1 lesion is 70% stenosed.  RPDA-2 lesion is 70%  stenosed.  Patient Profile     Kenneth [name] is a 79 y.o. male with history of CAD s/p inferior MI s/p DES to RCA August 2021 now on aspirin and Plavix, hyperlipidemia, hypertension, CAD left ear s/p stents, reflux, tracheostomy and presented with typical cardiac chest pain with elevated troponins  Assessment & Plan    1. CAD/NSTEMI s/p PCI with DES to prox RCA: Patient is s/p left heart cath with DES to the proximal RCA. Patient tolerated the procedure well. Patient should remain on DAPT. We will repeat an echo tomorrow to reassess his cardiac function and plan to discharge patient home at that time depending on the result.  - DAPT therpay - Repeat TTE tomorrow. - Will get Hgb A1c today  2. HLD with hx of CAD, carotid stenosis, CVA:  Lipid panel shows total cholesterol of 150, TAG 240, HDL of 28, and LDL of 74 on Zetia. He would likely benefit from starting icosapent ethyl 2 g daily BID with meals.  - Icosapent ethyl 2 g daily BID with meals - Continue Zetia   3. HTN: - Restart patients antihypertensive medications this evening with amlodipine 10 mg daily, metoprolol 25 mg BID  For questions or updates, please contact [facility] Please consult [email or web address] for contact info under Cardiology/STEMI.      Signed, Marianna Payment, MD  06/05/2020, 10:26 AM

## 2020-06-05 NOTE — Progress Notes (Signed)
Site area: Right groin a 6 french arterial sheath was removed  Site Prior to Removal:  Level 0  Pressure Applied For 20 MINUTES    Bedrest  Beginning at 1330p X 6 hours  Manual:   Yes.    Patient Status During Pull:  stable  Post Pull Groin Site:  Level 0  Post Pull Instructions Given:  Yes.    Post Pull Pulses Present:  Yes.    Dressing Applied:  Yes.    Comments:

## 2020-06-06 ENCOUNTER — Ambulatory Visit: Payer: Medicare HMO | Admitting: Cardiology

## 2020-06-06 ENCOUNTER — Inpatient Hospital Stay (HOSPITAL_COMMUNITY): Payer: Medicare HMO

## 2020-06-06 DIAGNOSIS — J029 Acute pharyngitis, unspecified: Secondary | ICD-10-CM

## 2020-06-06 DIAGNOSIS — I214 Non-ST elevation (NSTEMI) myocardial infarction: Secondary | ICD-10-CM | POA: Diagnosis not present

## 2020-06-06 DIAGNOSIS — Z951 Presence of aortocoronary bypass graft: Secondary | ICD-10-CM

## 2020-06-06 LAB — BASIC METABOLIC PANEL
Anion gap: 10 (ref 5–15)
BUN: 15 mg/dL (ref 8–23)
CO2: 24 mmol/L (ref 22–32)
Calcium: 8.6 mg/dL — ABNORMAL LOW (ref 8.9–10.3)
Chloride: 106 mmol/L (ref 98–111)
Creatinine, Ser: 1.52 mg/dL — ABNORMAL HIGH (ref 0.61–1.24)
GFR, Estimated: 46 mL/min — ABNORMAL LOW (ref >60)
Glucose, Bld: 98 mg/dL (ref 70–99)
Potassium: 4.2 mmol/L (ref 3.5–5.1)
Sodium: 140 mmol/L (ref 135–145)

## 2020-06-06 LAB — ECHOCARDIOGRAM LIMITED
Area-P 1/2: 3.17 cm2
Height: 66 in
S' Lateral: 3.8 cm
Weight: 2849.4 oz

## 2020-06-06 LAB — CBC
HCT: 38.2 % — ABNORMAL LOW (ref 39.0–52.0)
Hemoglobin: 12.2 g/dL — ABNORMAL LOW (ref 13.0–17.0)
MCH: 29.5 pg (ref 26.0–34.0)
MCHC: 31.9 g/dL (ref 30.0–36.0)
MCV: 92.5 fL (ref 80.0–100.0)
Platelets: 205 10*3/uL (ref 150–400)
RBC: 4.13 MIL/uL — ABNORMAL LOW (ref 4.22–5.81)
RDW: 14.3 % (ref 11.5–15.5)
WBC: 9.7 10*3/uL (ref 4.0–10.5)
nRBC: 0 % (ref 0.0–0.2)

## 2020-06-06 MED ORDER — LIDOCAINE VISCOUS HCL 2 % MT SOLN
15.0000 mL | OROMUCOSAL | Status: DC | PRN
  Filled 2020-06-06: qty 15

## 2020-06-06 MED ORDER — PANTOPRAZOLE SODIUM 40 MG PO TBEC: 40.0000 mg | DELAYED_RELEASE_TABLET | Freq: Every day | ORAL | 3 refills | Status: DC

## 2020-06-06 MED ORDER — PERFLUTREN LIPID MICROSPHERE
1.0000 mL | INTRAVENOUS | Status: DC | PRN
  Administered 2020-06-06: 3 mL via INTRAVENOUS
  Filled 2020-06-06: qty 10

## 2020-06-06 MED FILL — Verapamil HCl IV Soln 2.5 MG/ML: INTRAVENOUS | Qty: 2 | Status: AC

## 2020-06-06 MED FILL — Nitroglycerin IV Soln 100 MCG/ML in D5W: INTRA_ARTERIAL | Qty: 10 | Status: AC

## 2020-06-06 NOTE — Progress Notes (Signed)
  Echocardiogram 2D Echocardiogram has been performed.  [name] 06/06/2020, 12:04 PM

## 2020-06-06 NOTE — Discharge Summary (Addendum)
Discharge Summary    Patient ID: Kenneth [name] MRN: [id number]; DOB: 08/19/1940  Admit date: 06/04/2020 Discharge date: 06/06/2020  Primary Care Provider: [name], MD  Primary Cardiologist: Kenneth Dolly, MD   Discharge Diagnoses    Principal Problem:   CAD S/P percutaneous coronary angioplasty Active Problems:   HLD (hyperlipidemia)   Carotid stenosis, bilateral   Essential hypertension   TIA (transient ischemic attack)   Tracheostomy dependent [facility])   NSTEMI (non-ST elevated myocardial infarction) ([place])   Hx of CABG   Sore throat    Diagnostic Studies/Procedures    06/05/20 LHC:   Ost LM to Mid LM lesion is 95% stenosed. Ost Cx to Prox Cx lesion is 99% stenosed. Prox LAD lesion is 90% stenosed. Previously placed Prox RCA to Mid RCA stent (unknown type) is widely patent. Ost RCA to Prox RCA lesion is 95% stenosed. RPDA-1 lesion is 70% stenosed. RPDA-2 lesion is 70% stenosed.     Diagnostic Dominance: Right  Echocardiogram 06/05/20:   1. Left ventricular ejection fraction, by estimation, is 40 to 45%. The  left ventricle has mildly decreased function. The left ventricle  demonstrates regional wall motion abnormalities with basal to mid inferior  akinesis, inferolateral hypokinesis, and  inferoseptal hypokinesis. Left ventricular diastolic parameters are  consistent with Grade II diastolic dysfunction (pseudonormalization).   2. Right ventricular systolic function is moderately reduced. The right  ventricular size is mildly enlarged. There is normal pulmonary artery  systolic pressure. The estimated right ventricular systolic pressure is  68.1 mmHg.   3. The mitral valve is normal in structure. Trivial mitral valve  regurgitation. No evidence of mitral stenosis.   4. The aortic valve is tricuspid. Aortic valve regurgitation is not  visualized. Mild to moderate aortic valve sclerosis/calcification is  present, without any evidence of aortic stenosis.    5. The inferior vena cava is normal in size with <50% respiratory  variability, suggesting right atrial pressure of 8 mmHg.   History of Present Illness     Kenneth [name] is a 79 y.o. 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 Plan was for transfer to [facility] for further ischemic evaluation with LHC. He was started on Heparin and NTG infusions   Hospital Course   LHC performed 06/05/20 that showed severe stenosis of the proximal RCA and underwent DES/PCI successful stenting. Also with patent previously placed p>>mRCA stent, patent LIMA to diagonal and LAD with a poorly visualized RIMA.   Echocardiogram performed post cath which showed an LVEF at 40-45% with basal to mid inferior akinesis, inferolateral hypokinesis, and  inferoseptal hypokinesis and G2DD. We will restart his home losartan at his current dose with plans to recheck echo in 3 months. Losartan was held on admission due to CKD which is essentially unchanged. Plan for repeat BMET at follow up. No fluid overload on exam.    There were no post cath complications. Cath site is stable without hematoma or bleeding. He has walked with cardiac rehab without difficulty. He will remain on DAPT with ASA and Plavix for at least one year. BPs have remained stable.   His LDL was 74 with triglycerides at 240 therefore Vascepa was added to his regimen with plan to refer to OP Lipid clinic for PCSK9 inhibitor therapy.  His HbA1c found to be elevated at 6.2 with previously normal result. Would consider adding Jardiance per PCP. Needs close follow up with PCP for therapy.   He did have complaints of a sore throat on discharge day which was treated with a swish and swallow. WBC normal  with normal oral cavity exam.     Hospital issues include:  CAD s/p NSTEMI: -History of inferior MI s/p DES to RCA 8/2021and CABG 2003 on ASA and Plavix who presented for LHC due to chest pain with elevated troponins found to have severe stenosis of the proximal RCA and underwent DES PCI -Plan for DAPT with ASA and Plavix which he was already on given prior history -Statin intolerant therefore Vascepa was initiated with plans for outpatient lipid clinic referral for possible PCSK9 inhibitor -Continue beta-blocker -No recurrent chest pain -Cath site stable   HLD: -Elevated LDL therefore Vascepa was added with consideration for PCSK9 inhibitor as an outpatient.  He is intolerant to statin and Zetia therapies -LDL, 74   HTN: -Stable, 124/68, 120/67, 126/63 -Continue current regimen   Ischemic cardiomyopathy: -Echocardiogram with LVEF at 40 to 45% with basal to mid inferior akinesis and inferior lateral hypokinesis with inferior septal hypokinesis with G2 DD -LVEF 02/2020 normal at 60 to 65% -Plan to restart losartan and repeat echo in 3 months  -NO s/s of fluid volume overload    CKD stage III: -Creatinine, 1.5 to which appears to be at his most recent baseline at 1.4-1.5 -On PTA losartan 50 mg daily -Restart today   DM2: -Hemoglobin A1c, 6.2 -May need to add Metformin as first-line medication however given CAD Jardiance would be beneficial -Follow closely with PCP  Consultants: None   The patient was seen and examined by Kenneth [name] who feels that he is stable and ready for discharge today, 06/06/20.   Did the patient have an acute coronary syndrome (MI, NSTEMI, STEMI, etc) this admission?:  Yes                               AHA/ACC Clinical Performance & Quality Measures: Aspirin prescribed? - Yes ADP Receptor Inhibitor (Plavix/Clopidogrel, Brilinta/Ticagrelor or Effient/Prasugrel) prescribed (includes medically managed patients)? - Yes Beta Blocker prescribed? -  Yes High Intensity Statin (Lipitor 40-39m or Crestor 20-419m prescribed? - No - statin intolerant  EF assessed during THIS hospitalization? - Yes For EF <40%, was ACEI/ARB prescribed? - No - Reason:  renal function  For EF <40%, Aldosterone Antagonist (Spironolactone or Eplerenone) prescribed? - No - Reason:  renal function  Cardiac Rehab Phase II ordered (including medically managed patients)? - Yes   _____________  Discharge Vitals Blood pressure 124/68, pulse (!) 57, temperature 98.7 F (37.1 C), temperature source Oral, resp. rate 18, height '5\' 6"'  (1.676 m), weight 80.8 kg, SpO2 92 %.  Filed Weights   06/04/20 2022 06/05/20 0200 06/06/20 0406  Weight: 81.7 kg 81.6 kg 80.8 kg    Labs & Radiologic Studies    CBC Recent Labs    06/05/20 0023 06/06/20 0330  WBC 8.4 9.7  HGB 11.8* 12.2*  HCT 36.8* 38.2*  MCV 93.4 92.5  PLT 216 20353 Basic Metabolic Panel Recent Labs    06/05/20 0023 06/06/20 0330  NA 138 140  K 3.8 4.2  CL 102 106  CO2 25 24  GLUCOSE 118* 98  BUN 16 15  CREATININE 1.59* 1.52*  CALCIUM 8.8* 8.6*   Liver Function  Tests No results for input(s): AST, ALT, ALKPHOS, BILITOT, PROT, ALBUMIN in the last 72 hours. No results for input(s): LIPASE, AMYLASE in the last 72 hours. High Sensitivity Troponin:   Recent Labs  Lab 06/04/20 1349 06/04/20 1602 06/04/20 2111 06/04/20 2218 06/05/20 0023  TROPONINIHS 1,929* 2,373* 2,990* 2,596* 2,621*    BNP Invalid input(s): POCBNP D-Dimer No results for input(s): DDIMER in the last 72 hours. Hemoglobin A1C Recent Labs    06/05/20 1355  HGBA1C 6.2*   Fasting Lipid Panel Recent Labs    06/05/20 0023  CHOL 150  HDL 28*  LDLCALC 74  TRIG 240*  CHOLHDL 5.4   Thyroid Function Tests No results for input(s): TSH, T4TOTAL, T3FREE, THYROIDAB in the last 72 hours.  Invalid input(s): FREET3 _____________  DG Chest 2 View  Result Date: 06/04/2020 CLINICAL DATA:  Left chest pain for 3 days, history of  laryngeal cancer, previous tobacco abuse EXAM: CHEST - 2 VIEW COMPARISON:  10/12/2019 FINDINGS: Frontal and lateral views of the chest demonstrate postsurgical changes from median sternotomy and tracheostomy. Cardiac silhouette is unremarkable. Chronic scarring throughout the lungs without airspace disease, effusion, or pneumothorax. No acute bony abnormalities. IMPRESSION: 1. Chronic areas of scarring.  No acute intrathoracic process. Electronically Signed   By: Randa Ngo M.D.   On: 06/04/2020 15:46   CARDIAC CATHETERIZATION  Result Date: 06/05/2020  Ost LM to Mid LM lesion is 95% stenosed.  Ost Cx to Prox Cx lesion is 99% stenosed.  Prox LAD lesion is 90% stenosed.  Previously placed Prox RCA to Mid RCA stent (unknown type) is widely patent.  Ost RCA to Prox RCA lesion is 95% stenosed.  RPDA-1 lesion is 70% stenosed.  RPDA-2 lesion is 70% stenosed.  Kenneth [name] is a 79 y.o. male  [id number] LOCATION:  FACILITY: [facility] PHYSICIAN: Quay Burow, M.D. Dec 02, 1940 DATE OF PROCEDURE:  06/05/2020 DATE OF DISCHARGE: CARDIAC CATHETERIZATION / PCI DES RCA History obtained from chart review.Mr. Keep 79 yo male history of CAD. Prior CABG in 2003. Admitted to [facility] in [place] 02/2020 with inferior STEMI. Catho showed 80% LM, LAD 90% ostial followed by proximal 99%, LCX prox 80-90%, occluded OM1, RCA proximal 95% mid. The RPDA had multiple sequential 90% lesions Patent LIMA-LAD, no vein grafts visualized. RIMA to CX not mentioned in cath report.  Received DES to RCA. Echo at that time LVEF 60-65%. 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  He was admitted with chest pain.  He had a right bundle branch block.  His troponins rose to over 2000.  Placed on IV heparin.  Presents now for diagnostic coronary angiography. PROCEDURE DESCRIPTION: The patient was brought to the second floor [place]  Cardiac cath lab in the postabsorptive state. He was premedicated with IV Versed and fentanyl. His right groinwas prepped and shaved in usual sterile fashion. Xylocaine 1% was used for local anesthesia. A 5 French sheath was inserted into the right common femoral  artery using standard Seldinger technique.  Ultrasound was used to identify the right common femoral artery and guide access.  A digital image was captured and placed in the patient's chart.  5 French right left Judkins diagnostic catheters on 5 French pigtail catheter and no torque guide catheter was used for selective coronary angiography, selective IMA graft angiography and left heart pressures.  Isovue dye was used for the entirety of the case.  Retrograde aorta, ventricular and pullback pressures  were recorded. Patient received a total of 12,000 as of heparin with an ACT of approximately 270.  He received an additional 300 mg of p.o. Plavix and was already on aspirin and Plavix prior to admission.  Isovue dye was used for the entirety of the intervention.  Retroaortic pressures monitored in the case. Using a 6 [place] JR4 guide catheter, 0.14/180 cm [place] and a 2 mm x 12 mm balloon the proximal RCA was easily wired and predilated.  Following this a 2.75 mm x 15 mm long Medtronic resolute Onyx drug-eluting stent was then carefully positioned across the proximal RCA across the diseased segment and deployed at 14 atm.  It was postdilated with a 3 mm x 12 mm noncompliant balloon at 14 atm (3.1 mm) resulting in reduction of a 95% ulcerated proximal dominant RCA stenosis to 0% residual.  Patient tolerated procedure well.  The guidewire and catheter were removed.  The sheath was secured.   Successful proximal RCA PCI drug-eluting stenting in the setting of non-STEMI.  The previously placed stent in the mid RCA was widely patent.  This was placed in [place] in August of this year.  The sequential LIMA to the diagonal and LAD was patent.  The  RIMA was not well visualized.  His LVEDP was 21.  The sheath will be removed once ACT falls below 170 and pressure held.  Patient be hydrated overnight, discharged home in the morning.  He left the lab in stable condition. Quay Burow. MD, [facility] 06/05/2020 9:59 AM   ECHOCARDIOGRAM COMPLETE  Result Date: 06/05/2020    ECHOCARDIOGRAM REPORT   Patient Name:   Kenneth [name] Date of Exam: 06/05/2020 Medical Rec #:  [id number]        Height:       66.0 in Accession #:    [id number]       Weight:       179.9 lb Date of Birth:  1940-08-05        BSA:          1.912 m Patient Age:    9 years         BP:           135/103 mmHg Patient Gender: M                HR:           54 bpm. Exam Location:  Inpatient Procedure: 2D Echo Indications:    I21.4 NSTEMI  History:        Patient has prior history of Echocardiogram examinations, most                 recent 03/18/2020. Previous Myocardial Infarction and CAD, Prior                 CABG, Stroke; Risk Factors:Hypertension and Former Smoker. CVA.                 tobacco abuse.  Sonographer:    Jannett Celestine RDCS (AE) Referring Phys: [id number] [place]  Sonographer Comments: Image acquisition challenging due to respiratory motion. see comments IMPRESSIONS  1. Left ventricular ejection fraction, by estimation, is 40 to 45%. The left ventricle has mildly decreased function. The left ventricle demonstrates regional wall motion abnormalities with basal to mid inferior akinesis, inferolateral hypokinesis, and inferoseptal hypokinesis. Left ventricular diastolic parameters are consistent with Grade II diastolic dysfunction (pseudonormalization).  2. Right ventricular systolic function is moderately reduced. The right ventricular size  is mildly enlarged. There is normal pulmonary artery systolic pressure. The estimated right ventricular systolic pressure is 43.1 mmHg.  3. The mitral valve is normal in structure. Trivial mitral valve regurgitation. No evidence of mitral  stenosis.  4. The aortic valve is tricuspid. Aortic valve regurgitation is not visualized. Mild to moderate aortic valve sclerosis/calcification is present, without any evidence of aortic stenosis.  5. The inferior vena cava is normal in size with <50% respiratory variability, suggesting right atrial pressure of 8 mmHg. FINDINGS  Left Ventricle: Left ventricular ejection fraction, by estimation, is 40 to 45%. The left ventricle has mildly decreased function. The left ventricle demonstrates regional wall motion abnormalities. The left ventricular internal cavity size was normal in size. There is no left ventricular hypertrophy. Left ventricular diastolic parameters are consistent with Grade II diastolic dysfunction (pseudonormalization). Right Ventricle: The right ventricular size is mildly enlarged. No increase in right ventricular wall thickness. Right ventricular systolic function is moderately reduced. There is normal pulmonary artery systolic pressure. The tricuspid regurgitant velocity is 2.36 m/s, and with an assumed right atrial pressure of 8 mmHg, the estimated right ventricular systolic pressure is 54.0 mmHg. Left Atrium: Left atrial size was normal in size. Right Atrium: Right atrial size was normal in size. Pericardium: There is no evidence of pericardial effusion. Mitral Valve: The mitral valve is normal in structure. Mild mitral annular calcification. Trivial mitral valve regurgitation. No evidence of mitral valve stenosis. Tricuspid Valve: The tricuspid valve is normal in structure. Tricuspid valve regurgitation is trivial. Aortic Valve: The aortic valve is tricuspid. Aortic valve regurgitation is not visualized. Mild to moderate aortic valve sclerosis/calcification is present, without any evidence of aortic stenosis. Pulmonic Valve: The pulmonic valve was normal in structure. Pulmonic valve regurgitation is not visualized. Aorta: The aortic root is normal in size and structure. Venous: The inferior  vena cava is normal in size with less than 50% respiratory variability, suggesting right atrial pressure of 8 mmHg. IAS/Shunts: No atrial level shunt detected by color flow Doppler.  LEFT VENTRICLE PLAX 2D LVIDd:         4.50 cm  Diastology LVIDs:         3.00 cm  LV e' medial:    6.64 cm/s LV PW:         1.00 cm  LV E/e' medial:  13.8 LV IVS:        1.00 cm  LV e' lateral:   8.16 cm/s LVOT diam:     1.90 cm  LV E/e' lateral: 11.2 LV SV:         48 LV SV Index:   25 LVOT Area:     2.84 cm  RIGHT VENTRICLE RV S prime:     9.25 cm/s TAPSE (M-mode): 1.4 cm LEFT ATRIUM           Index       RIGHT ATRIUM           Index LA diam:      4.20 cm 2.20 cm/m  RA Area:     12.10 cm LA Vol (A4C): 53.6 ml 28.04 ml/m RA Volume:   19.40 ml  10.15 ml/m  AORTIC VALVE LVOT Vmax:   83.60 cm/s LVOT Vmean:  54.700 cm/s LVOT VTI:    0.171 m  AORTA Ao Root diam: 3.40 cm MITRAL VALVE               TRICUSPID VALVE MV Area (PHT): 3.77 cm    TR Peak  grad:   22.3 mmHg MV Decel Time: 201 msec    TR Vmax:        236.00 cm/s MV E velocity: 91.70 cm/s MV A velocity: 39.40 cm/s  SHUNTS MV E/A ratio:  2.33        Systemic VTI:  0.17 m                            Systemic Diam: 1.90 cm Loralie Champagne MD Electronically signed by Loralie Champagne MD Signature Date/Time: 06/05/2020/4:08:16 PM    Final    Disposition   Pt is being discharged home today in good condition.  Follow-up Plans & Appointments    Follow-up Information     [facility] Follow up on 06/13/2020.   Specialty: Cardiology Why: Your appointment will be with Coletta Memos, NP at 2:15pm  Contact information: Bethany [place]ry               Discharge Instructions     Amb Referral to Cardiac Rehabilitation   Complete by: As directed    Will send Cardiac Rehab Phase 2 referral to [facility]   Diagnosis:  Coronary Stents NSTEMI     After initial evaluation and assessments completed: Virtual Based Care may be  provided alone or in conjunction with Phase 2 Cardiac Rehab based on patient barriers.: Yes   Call MD for:  difficulty breathing, headache or visual disturbances   Complete by: As directed    Call MD for:  extreme fatigue   Complete by: As directed    Call MD for:  hives   Complete by: As directed    Call MD for:  persistant dizziness or light-headedness   Complete by: As directed    Call MD for:  persistant nausea and vomiting   Complete by: As directed    Call MD for:  redness, tenderness, or signs of infection (pain, swelling, redness, odor or green/yellow discharge around incision site)   Complete by: As directed    Call MD for:  severe uncontrolled pain   Complete by: As directed    Call MD for:  temperature >100.4   Complete by: As directed    Diet - low sodium heart healthy   Complete by: As directed    Discharge instructions   Complete by: As directed    No driving for 3 days. No lifting over 5 lbs for 1 week. No sexual activity for 1 week. Keep procedure site clean & dry. If you notice increased pain, swelling, bleeding or pus, call/return!  You may shower, but no soaking baths/hot tubs/pools for 1 week.   PLEASE DO NOT MISS ANY DOSES OF YOUR PLAVIX!!!! Also keep a log of you blood pressures and bring back to your follow up appt. Please call the office with any questions.   Patients taking blood thinners should generally stay away from medicines like ibuprofen, Advil, Motrin, naproxen, and Aleve due to risk of stomach bleeding. You may take Tylenol as directed or talk to your primary doctor about alternatives.  Some studies suggest Prilosec/Omeprazole interacts with Plavix. We changed your Prilosec/Omeprazole to the equivalent dose of Protonix for less chance of interaction.  PLEASE ENSURE THAT YOU DO NOT RUN OUT OF YOUR PLAVIX. This medication is very important to remain on for at least one year. IF you have issues obtaining this medication due to cost please CALL the office  3-5 business  days prior to running out in order to prevent missing doses of this medication.   Increase activity slowly   Complete by: As directed       Discharge Medications   Allergies as of 06/06/2020       Reactions   Nifedipine Other (See Comments)   Lorazepam Other (See Comments)   REACTION: Alters mental status. "Turns into maniac" REACTION: Alters mental status. "Turns into maniac" REACTION: Alters mental status. "Turns into maniac"   Other    REACTION: Unknown to patient. States that MD states allergy per medical records   Penicillins Hives   Has patient had a PCN reaction causing immediate rash, facial/tongue/throat swelling, SOB or lightheadedness with hypotension: Yes Has patient had a PCN reaction causing severe rash involving mucus membranes or skin necrosis: No Has patient had a PCN reaction that required hospitalization No Has patient had a PCN reaction occurring within the last 10 years: No If all of the above answers are "NO", then may proceed with Cephalosporin use. HAS TOLERATED: cephalexin   Sulfacetamide    REACTION: Unknown to patient. States that MD states allergy per medical records   Sulfonamide Derivatives Other (See Comments)   REACTION: Unknown to patient. States that MD states allergy per medical records   Erythromycin Rash   Statins Itching, Rash   Vancomycin Itching        Medication List     STOP taking these medications    omeprazole 20 MG capsule Commonly known as: PRILOSEC Replaced by: pantoprazole 40 MG tablet       TAKE these medications    acetaminophen 500 MG tablet Commonly known as: TYLENOL Take 500 mg by mouth every 6 (six) hours as needed.   acetaminophen-codeine 300-30 MG tablet Commonly known as: TYLENOL #3 Take 1 tablet by mouth every 4 (four) hours as needed for moderate pain.   albuterol (2.5 MG/3ML) 0.083% nebulizer solution Commonly known as: PROVENTIL Take 3 mLs (2.5 mg total) by nebulization 3 (three)  times daily. X 5 days and then q 6 hrs prn after that What changed: additional instructions   amLODipine 10 MG tablet Commonly known as: NORVASC Take 1 tablet by mouth once daily   aspirin EC 81 MG tablet Take 81 mg by mouth daily. Swallow whole.   clopidogrel 75 MG tablet Commonly known as: PLAVIX Take 1 tablet by mouth once daily   doxylamine (Sleep) 25 MG tablet Commonly known as: UNISOM Take 25 mg by mouth at bedtime as needed.   ezetimibe 10 MG tablet Commonly known as: ZETIA Take 1 tablet by mouth once daily   guaifenesin 400 MG Tabs tablet Commonly known as: HUMIBID E Take 400 mg by mouth every 4 (four) hours as needed.   losartan 50 MG tablet Commonly known as: COZAAR Take 50 mg by mouth daily.   metoprolol tartrate 25 MG tablet Commonly known as: LOPRESSOR Take 25 mg by mouth 2 (two) times daily.   nitroGLYCERIN 0.4 MG SL tablet Commonly known as: Nitrostat Place 1 tablet (0.4 mg total) under the tongue every 5 (five) minutes as needed for chest pain.   pantoprazole 40 MG tablet Commonly known as: PROTONIX Take 1 tablet (40 mg total) by mouth daily. Start taking on: June 07, 2020 Replaces: omeprazole 20 MG capsule   PARoxetine 20 MG tablet Commonly known as: PAXIL Take 20 mg by mouth at bedtime.   TUSSIN DM MAX PO Take by mouth. As directed as needed  Outstanding Labs/Studies   BMET   Duration of Discharge Encounter   Greater than 30 minutes including physician time.  Signed, Kathyrn Drown, NP 06/06/2020, 11:28 AM  Patient seen and examined.  Plan as discussed in my rounding note for today and outlined above. Jeneen Rinks [facility]  06/06/2020  11:59 AM

## 2020-06-06 NOTE — Progress Notes (Addendum)
Progress Note  Patient Name: Kenneth [name] Date of Encounter: 06/06/2020  Primary Cardiologist: Dr. Carlyle Dolly, MD   Subjective   No recurrent chest pain. Cath site stable. Reports he feels worse today with a sore throat however seems stable from a cardiac standpoint   Inpatient Medications    Scheduled Meds: . amLODipine  10 mg Oral Daily  . aspirin  81 mg Oral Daily  . clopidogrel  75 mg Oral Q breakfast  . metoprolol tartrate  25 mg Oral BID  . pantoprazole  40 mg Oral Daily  . PARoxetine  20 mg Oral QHS  . sodium chloride flush  3 mL Intravenous Q12H  . sodium chloride flush  3 mL Intravenous Q12H   Continuous Infusions: . sodium chloride    . nitroGLYCERIN Stopped (06/05/20 0906)   PRN Meds: sodium chloride, acetaminophen, doxylamine (Sleep), menthol-cetylpyridinium, morphine injection, nitroGLYCERIN, ondansetron (ZOFRAN) IV, sodium chloride flush   Vital Signs    Vitals:   06/05/20 2206 06/05/20 2324 06/06/20 0200 06/06/20 0406  BP: 120/67 124/68    Pulse: 63  (!) 57   Resp:  '18 20 18  ' Temp:  98.7 F (37.1 C)  98.7 F (37.1 C)  TempSrc:  Oral  Oral  SpO2:   92%   Weight:    80.8 kg  Height:        Intake/Output Summary (Last 24 hours) at 06/06/2020 0746 Last data filed at 06/06/2020 0406 Gross per 24 hour  Intake 1285.61 ml  Output 1825 ml  Net -539.39 ml   Filed Weights   06/04/20 2022 06/05/20 0200 06/06/20 0406  Weight: 81.7 kg 81.6 kg 80.8 kg    Physical Exam   General: Well developed, well nourished, NAD Neck: Negative for carotid bruits. No JVD Lungs:Clear to ausculation bilaterally. No wheezes, rales, or rhonchi. Breathing is unlabored. Cardiovascular: RRR with S1 S2. No murmurs Abdomen: Soft, non-tender, non-distended. No obvious abdominal masses. Extremities: No edema. Radial pulses 2+ bilaterally Neuro: Alert and oriented. No focal deficits. No facial asymmetry. MAE spontaneously. Psych: Responds to questions  appropriately with normal affect.    Labs    Chemistry Recent Labs  Lab 06/04/20 1349 06/05/20 0023 06/06/20 0330  NA 136 138 140  K 4.4 3.8 4.2  CL 101 102 106  CO2 '27 25 24  ' GLUCOSE 134* 118* 98  BUN '19 16 15  ' CREATININE 1.45* 1.59* 1.52*  CALCIUM 8.8* 8.8* 8.6*  GFRNONAA 48* 43* 46*  ANIONGAP '8 11 10     ' Hematology Recent Labs  Lab 06/04/20 1349 06/05/20 0023 06/06/20 0330  WBC 9.5 8.4 9.7  RBC 4.10* 3.94* 4.13*  HGB 12.0* 11.8* 12.2*  HCT 39.1 36.8* 38.2*  MCV 95.4 93.4 92.5  MCH 29.3 29.9 29.5  MCHC 30.7 32.1 31.9  RDW 14.0 14.1 14.3  PLT 226 216 205    Cardiac EnzymesNo results for input(s): TROPONINI in the last 168 hours. No results for input(s): TROPIPOC in the last 168 hours.   BNPNo results for input(s): BNP, PROBNP in the last 168 hours.   DDimer No results for input(s): DDIMER in the last 168 hours.   Radiology    DG Chest 2 View  Result Date: 06/04/2020 CLINICAL DATA:  Left chest pain for 3 days, history of laryngeal cancer, previous tobacco abuse EXAM: CHEST - 2 VIEW COMPARISON:  10/12/2019 FINDINGS: Frontal and lateral views of the chest demonstrate postsurgical changes from median sternotomy and tracheostomy. Cardiac silhouette is unremarkable.  Chronic scarring throughout the lungs without airspace disease, effusion, or pneumothorax. No acute bony abnormalities. IMPRESSION: 1. Chronic areas of scarring.  No acute intrathoracic process. Electronically Signed   By: Randa Ngo M.D.   On: 06/04/2020 15:46   CARDIAC CATHETERIZATION  Result Date: 06/05/2020  Ost LM to Mid LM lesion is 95% stenosed.  Ost Cx to Prox Cx lesion is 99% stenosed.  Prox LAD lesion is 90% stenosed.  Previously placed Prox RCA to Mid RCA stent (unknown type) is widely patent.  Ost RCA to Prox RCA lesion is 95% stenosed.  RPDA-1 lesion is 70% stenosed.  RPDA-2 lesion is 70% stenosed.  Kenneth [name] is a 79 y.o. male  [id number] LOCATION:  FACILITY: [facility] PHYSICIAN:  Quay Burow, M.D. 1940/09/17 DATE OF PROCEDURE:  06/05/2020 DATE OF DISCHARGE: CARDIAC CATHETERIZATION / PCI DES RCA History obtained from chart review.Mr. Zagal 79 yo male history of CAD. Prior CABG in 2003. Admitted to [facility] in [place] 02/2020 with inferior STEMI. Catho showed 80% LM, LAD 90% ostial followed by proximal 99%, LCX prox 80-90%, occluded OM1, RCA proximal 95% mid. The RPDA had multiple sequential 90% lesions Patent LIMA-LAD, no vein grafts visualized. RIMA to CX not mentioned in cath report.  Received DES to RCA. Echo at that time LVEF 60-65%. 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  He was admitted with chest pain.  He had a right bundle branch block.  His troponins rose to over 2000.  Placed on IV heparin.  Presents now for diagnostic coronary angiography. PROCEDURE DESCRIPTION: The patient was brought to the second floor [facility] cath lab in the postabsorptive state. He was premedicated with IV Versed and fentanyl. His right groinwas prepped and shaved in usual sterile fashion. Xylocaine 1% was used for local anesthesia. A 5 French sheath was inserted into the right common femoral  artery using standard Seldinger technique.  Ultrasound was used to identify the right common femoral artery and guide access.  A digital image was captured and placed in the patient's chart.  5 French right left Judkins diagnostic catheters on 5 French pigtail catheter and no torque guide catheter was used for selective coronary angiography, selective IMA graft angiography and left heart pressures.  Isovue dye was used for the entirety of the case.  Retrograde aorta, ventricular and pullback pressures were recorded. Patient received a total of 12,000 as of heparin with an ACT of approximately 270.  He received an additional 300 mg of p.o. Plavix and was already on aspirin and Plavix prior to  admission.  Isovue dye was used for the entirety of the intervention.  Retroaortic pressures monitored in the case. Using a 6 [place] JR4 guide catheter, 0.14/180 cm [place] and a 2 mm x 12 mm balloon the proximal RCA was easily wired and predilated.  Following this a 2.75 mm x 15 mm long Medtronic resolute Onyx drug-eluting stent was then carefully positioned across the proximal RCA across the diseased segment and deployed at 14 atm.  It was postdilated with a 3 mm x 12 mm noncompliant balloon at 14 atm (3.1 mm) resulting in reduction of a 95% ulcerated proximal dominant RCA stenosis to 0% residual.  Patient tolerated procedure well.  The guidewire and catheter were removed.  The sheath was secured.   Successful proximal RCA PCI drug-eluting stenting in the setting of non-STEMI.  The previously placed stent in the mid RCA was widely  patent.  This was placed in [place] in August of this year.  The sequential LIMA to the diagonal and LAD was patent.  The RIMA was not well visualized.  His LVEDP was 21.  The sheath will be removed once ACT falls below 170 and pressure held.  Patient be hydrated overnight, discharged home in the morning.  He left the lab in stable condition. Quay Burow. MD, [facility] 06/05/2020 9:59 AM   ECHOCARDIOGRAM COMPLETE  Result Date: 06/05/2020    ECHOCARDIOGRAM REPORT   Patient Name:   Kenneth [name] Date of Exam: 06/05/2020 Medical Rec #:  [id number]        Height:       66.0 in Accession #:    [id number]       Weight:       179.9 lb Date of Birth:  05-Jul-1941        BSA:          1.912 m Patient Age:    57 years         BP:           135/103 mmHg Patient Gender: M                HR:           54 bpm. Exam Location:  Inpatient Procedure: 2D Echo Indications:    I21.4 NSTEMI  History:        Patient has prior history of Echocardiogram examinations, most                 recent 03/18/2020. Previous Myocardial Infarction and CAD, Prior                 CABG, Stroke; Risk  Factors:Hypertension and Former Smoker. CVA.                 tobacco abuse.  Sonographer:    Jannett Celestine RDCS (AE) Referring Phys: [id number] Boyd  Sonographer Comments: Image acquisition challenging due to respiratory motion. see comments IMPRESSIONS  1. Left ventricular ejection fraction, by estimation, is 40 to 45%. The left ventricle has mildly decreased function. The left ventricle demonstrates regional wall motion abnormalities with basal to mid inferior akinesis, inferolateral hypokinesis, and inferoseptal hypokinesis. Left ventricular diastolic parameters are consistent with Grade II diastolic dysfunction (pseudonormalization).  2. Right ventricular systolic function is moderately reduced. The right ventricular size is mildly enlarged. There is normal pulmonary artery systolic pressure. The estimated right ventricular systolic pressure is 23.5 mmHg.  3. The mitral valve is normal in structure. Trivial mitral valve regurgitation. No evidence of mitral stenosis.  4. The aortic valve is tricuspid. Aortic valve regurgitation is not visualized. Mild to moderate aortic valve sclerosis/calcification is present, without any evidence of aortic stenosis.  5. The inferior vena cava is normal in size with <50% respiratory variability, suggesting right atrial pressure of 8 mmHg. FINDINGS  Left Ventricle: Left ventricular ejection fraction, by estimation, is 40 to 45%. The left ventricle has mildly decreased function. The left ventricle demonstrates regional wall motion abnormalities. The left ventricular internal cavity size was normal in size. There is no left ventricular hypertrophy. Left ventricular diastolic parameters are consistent with Grade II diastolic dysfunction (pseudonormalization). Right Ventricle: The right ventricular size is mildly enlarged. No increase in right ventricular wall thickness. Right ventricular systolic function is moderately reduced. There is normal pulmonary artery systolic  pressure. The tricuspid regurgitant velocity is 2.36 m/s, and with an assumed right atrial  pressure of 8 mmHg, the estimated right ventricular systolic pressure is 84.1 mmHg. Left Atrium: Left atrial size was normal in size. Right Atrium: Right atrial size was normal in size. Pericardium: There is no evidence of pericardial effusion. Mitral Valve: The mitral valve is normal in structure. Mild mitral annular calcification. Trivial mitral valve regurgitation. No evidence of mitral valve stenosis. Tricuspid Valve: The tricuspid valve is normal in structure. Tricuspid valve regurgitation is trivial. Aortic Valve: The aortic valve is tricuspid. Aortic valve regurgitation is not visualized. Mild to moderate aortic valve sclerosis/calcification is present, without any evidence of aortic stenosis. Pulmonic Valve: The pulmonic valve was normal in structure. Pulmonic valve regurgitation is not visualized. Aorta: The aortic root is normal in size and structure. Venous: The inferior vena cava is normal in size with less than 50% respiratory variability, suggesting right atrial pressure of 8 mmHg. IAS/Shunts: No atrial level shunt detected by color flow Doppler.  LEFT VENTRICLE PLAX 2D LVIDd:         4.50 cm  Diastology LVIDs:         3.00 cm  LV e' medial:    6.64 cm/s LV PW:         1.00 cm  LV E/e' medial:  13.8 LV IVS:        1.00 cm  LV e' lateral:   8.16 cm/s LVOT diam:     1.90 cm  LV E/e' lateral: 11.2 LV SV:         48 LV SV Index:   25 LVOT Area:     2.84 cm  RIGHT VENTRICLE RV S prime:     9.25 cm/s TAPSE (M-mode): 1.4 cm LEFT ATRIUM           Index       RIGHT ATRIUM           Index LA diam:      4.20 cm 2.20 cm/m  RA Area:     12.10 cm LA Vol (A4C): 53.6 ml 28.04 ml/m RA Volume:   19.40 ml  10.15 ml/m  AORTIC VALVE LVOT Vmax:   83.60 cm/s LVOT Vmean:  54.700 cm/s LVOT VTI:    0.171 m  AORTA Ao Root diam: 3.40 cm MITRAL VALVE               TRICUSPID VALVE MV Area (PHT): 3.77 cm    TR Peak grad:   22.3 mmHg MV  Decel Time: 201 msec    TR Vmax:        236.00 cm/s MV E velocity: 91.70 cm/s MV A velocity: 39.40 cm/s  SHUNTS MV E/A ratio:  2.33        Systemic VTI:  0.17 m                            Systemic Diam: 1.90 cm Loralie Champagne MD Electronically signed by Loralie Champagne MD Signature Date/Time: 06/05/2020/4:08:16 PM    Final    Telemetry    06/06/20 NSR- Personally Reviewed  ECG    No new tracings as of 06/06/2020- Personally Reviewed  Cardiac Studies   06/05/20 LHC:   Colon Flattery LM to Mid LM lesion is 95% stenosed.  Ost Cx to Prox Cx lesion is 99% stenosed.  Prox LAD lesion is 90% stenosed.  Previously placed Prox RCA to Mid RCA stent (unknown type) is widely patent.  Ost RCA to Prox RCA lesion is 95% stenosed.  RPDA-1  lesion is 70% stenosed.  RPDA-2 lesion is 70% stenosed.   Diagnostic Dominance: Right  Echocardiogram 06/05/20:  1. Left ventricular ejection fraction, by estimation, is 40 to 45%. The  left ventricle has mildly decreased function. The left ventricle  demonstrates regional wall motion abnormalities with basal to mid inferior  akinesis, inferolateral hypokinesis, and  inferoseptal hypokinesis. Left ventricular diastolic parameters are  consistent with Grade II diastolic dysfunction (pseudonormalization).  2. Right ventricular systolic function is moderately reduced. The right  ventricular size is mildly enlarged. There is normal pulmonary artery  systolic pressure. The estimated right ventricular systolic pressure is  69.6 mmHg.  3. The mitral valve is normal in structure. Trivial mitral valve  regurgitation. No evidence of mitral stenosis.  4. The aortic valve is tricuspid. Aortic valve regurgitation is not  visualized. Mild to moderate aortic valve sclerosis/calcification is  present, without any evidence of aortic stenosis.  5. The inferior vena cava is normal in size with <50% respiratory  variability, suggesting right atrial pressure of 8 mmHg.    Patient Profile     79 y.o. male with history of CAD s/p inferior MI s/p DES to RCA August 2021 now on aspirin and Plavix, hyperlipidemia, hypertension, CAD left ear s/p stents, reflux, tracheostomy and presented with typical cardiac chest pain with elevated troponins  Assessment & Plan    1. CAD s/p NSTEMI: -History of inferior MI s/p DES to RCA 02/2020 on ASA and Plavix who presented for LHC due to chest pain with elevated troponins found to have severe stenosis of the proximal RCA and underwent DES PCI -Plan for DAPT with ASA and Plavix which he was already on given prior history -Statin intolerant therefore Vascepa was initiated with plans for outpatient lipid clinic referral for possible PCSK9 inhibitor -Continue beta-blocker -No recurrent chest pain -Cath site stable  2.  HLD: -Elevated LDL therefore Vascepa was added with consideration for PCSK9 inhibitor as an outpatient.  He is intolerant to statin and Zetia therapies -LDL, 74  3.  HTN: -Stable, 124/68, 120/67, 126/63 -Continue current regimen  4.  Ischemic cardiomyopathy: -Echocardiogram with LVEF at 40 to 45% with basal to mid inferior akinesis and inferior lateral hypokinesis with inferior septal hypokinesis with G2 DD -LVEF 02/2020 normal at 60 to 65% -Plan to restart losartan and repeat echo in 3 months  -NO s/s of fluid volume overload   5.  CKD stage III: -Creatinine, 1.5 to which appears to be at his most recent baseline at 1.4-1.5 -On PTA losartan 50 mg daily -Restart today  6.  DM2: -Hemoglobin A1c, 6.2 -May need to add Metformin as first-line medication however given CAD Jardiance would be beneficial -Follow closely with PCP   Signed, Kathyrn Drown NP-C [facility] Pager: [phone] 06/06/2020, 7:46 AM   For questions or updates, please contact   Please consult [email or web address] for contact info under Cardiology/STEMI.  History and all data above reviewed.  Patient examined.  I agree with the findings  as above.   He has a sore throat but no chest pain.  No SOB.  The patient exam reveals COR:RRR  ,  Lungs: Clear  ,  Abd: Positive bowel sounds, no rebound no guarding , Ext no edema  HEENT:  Oral mucosa without thrush or erythema  .  All available labs, radiology testing, previous records reviewed. Agree with documented assessment and plan.   CAD:  Status post PCI.  OK to go home.    DM:  Needs to follow up  with [name], MD to discuss therapy.  First line metformin. DYSLIPIDEMIA:  Needs to follow up with [facility]id Clinic for PCSK9.  CM:  Agree with restart Cozaar.  SORE THROAT: Nothing on exam.  Will give lidocaine swish and swallow and send home with leftovers.   Jeneen Rinks [facility]  10:34 AM  06/06/2020\

## 2020-06-06 NOTE — Progress Notes (Signed)
CARDIAC REHAB PHASE I   PRE:  Rate/Rhythm: 57 SR  BP:  Sitting: 130/65      SaO2: 93 RA  MODE:  Ambulation: 271ft   POST:  Rate/Rhythm: 72 SR  BP:  Sitting: 143/84    SaO2: 99 RA   Pt ambulated 268ft in hallway handheld assist with slow steady gait. Pt denies CP, SOB, or dizziness. Pt educated on importance of ASA and Plavix. Pt given MI book along with heart healthy diet. Reviewed site care, restrictions, and exercise guidelines. Will refer to CRP II [place].  [id number] Rufina Falco, RN BSN 06/06/2020 10:12 AM

## 2020-06-09 ENCOUNTER — Telehealth (HOSPITAL_COMMUNITY): Payer: Self-pay

## 2020-06-09 NOTE — Telephone Encounter (Signed)
Faxed cardiac rehab referral to [facility] cardiac rehab per phase I.

## 2020-06-09 NOTE — Progress Notes (Signed)
Cardiology Clinic Note   Patient Name: Kenneth [name] Date of Encounter: 06/13/2020  Primary Care Provider:  [name], MD Primary Cardiologist:  Carlyle Dolly, MD  Patient Profile    Kenneth [name] 79 year old male presents to the clinic today for follow-up evaluation of his coronary artery disease, and hypertension.  Past Medical History    Past Medical History:  Diagnosis Date  . Acute ST elevation myocardial infarction (STEMI) of inferior wall [facility]) 03/01/2020   [facility]  . Anxiety   . 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 ASCVD (arteriosclerotic cardiovascular disease)   . Chronic lower back pain    "have it at night" (05/04/2018)  . Coronary artery disease    a. 2003: s/p CABG x3V  b. 2007: cath with patent bypass grafts.  c. 09/2016: [place] with cath showing patent bypass grafts, medical Rx recommended  . CVA (cerebral vascular accident) ([place]) 10/2017   "little numb on my left face since" (05/04/2018)  . Depression   . GERD (gastroesophageal reflux disease)   . High triglycerides   . History of kidney stones   . Hypertension   . 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 Malodorous urine    "in the last 5wks; since I had trach put in" (05/04/2018)  . Peripheral vascular disease ([place])   . Stroke [facility])    "he's had several little strokes; many that he wasn't aware of" (05/04/2018)  . Tobacco abuse   . Tracheal stenosis    Past Surgical History:  Procedure Laterality Date  . CAROTID STENT Right 06-13-12; 05/04/2018  . CAROTID STENT INSERTION N/A 06/13/2012   Procedure: CAROTID STENT INSERTION;  Surgeon: Serafina Mitchell, MD;  Location: [facility] CATH LAB;  Service: Cardiovascular;  Laterality: N/A;  . CATARACT EXTRACTION, BILATERAL Bilateral   . COLONOSCOPY  11/10/2011   Dr. [name]: hemorrhoids, tubular adenoma  . CORNEAL TRANSPLANT Bilateral   . CORONARY ARTERY BYPASS GRAFT  ~ 2003   "CABG X3"  . CORONARY STENT INTERVENTION N/A 06/05/2020   Procedure:  CORONARY STENT INTERVENTION;  Surgeon: Lorretta Harp, MD;  Location: [place] CV LAB;  Service: Cardiovascular;  Laterality: N/A;  . EYE SURGERY    . INSERTION OF RETROGRADE CAROTID STENT Right 05/04/2018   Procedure: INSERTION OF RIGHT  CAROTID STENT using an ABBOT- XACT carotid stent system;  Surgeon: Serafina Mitchell, MD;  Location: [place];  Service: Vascular;  Laterality: Right;  . LAPAROSCOPIC CHOLECYSTECTOMY  2007  . LEFT HEART CATH AND CORS/GRAFTS ANGIOGRAPHY N/A 10/07/2016   Procedure: Left Heart Cath and Cors/Grafts Angiography;  Surgeon: Troy Sine, MD;  Location: [place] CV LAB;  Service: Cardiovascular;  Laterality: N/A;  . LEFT HEART CATH AND CORS/GRAFTS ANGIOGRAPHY N/A 06/05/2020   Procedure: LEFT HEART CATH AND CORS/GRAFTS ANGIOGRAPHY;  Surgeon: Lorretta Harp, MD;  Location: [place] CV LAB;  Service: Cardiovascular;  Laterality: N/A;  . PARTIAL LARYNGECTOMY  1998  . TRACHEOSTOMY  03/13/2018   "@ [facility]"    Allergies  Allergies  Allergen Reactions  . Nifedipine Other (See Comments)  . Lorazepam Other (See Comments)    REACTION: Alters mental status. "Turns into maniac" REACTION: Alters mental status. "Turns into maniac" REACTION: Alters mental status. "Turns into maniac"  . Other     REACTION: Unknown to patient. States that MD states allergy per medical records  . Penicillins Hives    Has patient had a PCN reaction causing immediate rash, facial/tongue/throat swelling, SOB  or lightheadedness with hypotension: Yes Has patient had a PCN reaction causing severe rash involving mucus membranes or skin necrosis: No Has patient had a PCN reaction that required hospitalization No Has patient had a PCN reaction occurring within the last 10 years: No If all of the above answers are "NO", then may proceed with Cephalosporin use.  HAS TOLERATED: cephalexin  . Sulfacetamide     REACTION: Unknown to patient. States that MD states allergy per medical records   . Sulfonamide Derivatives Other (See Comments)    REACTION: Unknown to patient. States that MD states allergy per medical records  . Erythromycin Rash  . Statins Itching and Rash  . Vancomycin Itching    History of Present Illness    Kenneth [name] is a PMH of Coronary artery disease (status post CABG 2003, status post inferior STEMI status post DES to RCA 8/21), NSTEMI status post PCA, HLD, bilateral carotid stenosis, HTN, TIA, and tracheostomy.  He presented to [facility] with typical cardiac chest pain and elevated troponins.  He was transferred to [facility] for further ischemic evaluation and LHC.  He underwent cardiac catheterization 06/05/2020 which showed severe stenosis of the proximal RCA and underwent DES with PCI to his RCA.  His previously placed mid RCA stent, and LIMA to diagonal graft was patent.  His RIMA-LAD graft was poorly visualized.  Echocardiogram showed an LVEF 40-45% with basilar to mid inferior akinesis-inferolateral hypokinesis, and inferior septal hypokinesis, G2 DD.  He was restarted on his home losartan.  We will plan to repeat his echocardiogram in 3 months was made.  His losartan was previously held on admission due to CKD.  On discharge he did have complaints of sore throat which were treated with swish and swallow.  His WBCs were normal with normal oral cavity on exam.  He presents the clinic today for follow-up evaluation states he feels well.  He has had no further episodes of chest pain since his cardiac catheterization.  He reports compliance with his medications and no side effects.  His wife states that she has been making better meals and significantly reduce the amount of salt in her cooking.  He has been somewhat limited in his physical activity and have encouraged him to increase it as tolerated.  I will refer him to the lipid clinic, repeat his echocardiogram in 3 months, give the salty 6 diet sheet, and have him follow-up in 3 months.  Today he  denies chest pain, shortness of breath, increased lower extremity edema, fatigue, palpitations, melena, hematuria, hemoptysis, diaphoresis, weakness, presyncope, syncope, orthopnea, and PND.   Home Medications    Prior to Admission medications   Medication Sig Start Date End Date Taking? Authorizing Provider  acetaminophen (TYLENOL) 500 MG tablet Take 500 mg by mouth every 6 (six) hours as needed.    [provider]  acetaminophen-codeine (TYLENOL #3) 300-30 MG tablet Take 1 tablet by mouth every 4 (four) hours as needed for moderate pain. Patient not taking: Reported on 06/04/2020    [provider]  albuterol (PROVENTIL) (2.5 MG/3ML) 0.083% nebulizer solution Take 3 mLs (2.5 mg total) by nebulization 3 (three) times daily. X 5 days and then q 6 hrs prn after that Patient taking differently: Take 2.5 mg by nebulization 3 (three) times daily. X 5 days and then every 6 hrs as needed after that 09/29/18   Roxan Hockey, MD  amLODipine (NORVASC) 10 MG tablet Take 1 tablet by mouth once daily 05/05/20  Arnoldo Lenis, MD  aspirin EC 81 MG tablet Take 81 mg by mouth daily. Swallow whole.    [provider]  clopidogrel (PLAVIX) 75 MG tablet Take 1 tablet by mouth once daily 02/13/20   Arnoldo Lenis, MD  Dextromethorphan-guaiFENesin (TUSSIN DM MAX PO) Take by mouth. As directed as needed Patient not taking: Reported on 06/04/2020    [provider]  doxylamine, Sleep, (UNISOM) 25 MG tablet Take 25 mg by mouth at bedtime as needed.    [provider]  ezetimibe (ZETIA) 10 MG tablet Take 1 tablet by mouth once daily 05/26/20   Arnoldo Lenis, MD  guaifenesin (HUMIBID E) 400 MG TABS tablet Take 400 mg by mouth every 4 (four) hours as needed. Patient not taking: Reported on 06/04/2020    [provider]  losartan (COZAAR) 50 MG tablet Take 50 mg by mouth daily.    [provider]  metoprolol tartrate (LOPRESSOR) 25 MG tablet Take  25 mg by mouth 2 (two) times daily.  03/16/17   [provider]  nitroGLYCERIN (NITROSTAT) 0.4 MG SL tablet Place 1 tablet (0.4 mg total) under the tongue every 5 (five) minutes as needed for chest pain. 01/31/17 05/25/21  Lendon Colonel, NP  pantoprazole (PROTONIX) 40 MG tablet Take 1 tablet (40 mg total) by mouth daily. 06/07/20   Kathyrn Drown D, NP  PARoxetine (PAXIL) 20 MG tablet Take 20 mg by mouth at bedtime.     [provider]    Family History    Family History  Problem Relation Age of Onset  . Dementia Mother   . Heart disease Father   . Cancer Brother   . Heart disease Brother   . Hyperlipidemia Brother   . Cancer Brother   . Colon cancer Neg Hx    He indicated that his mother is deceased. He indicated that his father is deceased. He indicated that two of his three sisters are alive. He indicated that the status of his neg hx is unknown.  Social History    Social History   Socioeconomic History  . Marital status: Married    Spouse name: Not on file  . Number of children: Not on file  . Years of education: Not on file  . Highest education level: Not on file  Occupational History  . Occupation: retired    Comment: Warden/ranger  Tobacco Use  . Smoking status: Former Smoker    Packs/day: 1.00    Years: 15.00    Pack years: 15.00    Types: Cigarettes    Start date: 04/22/1953    Quit date: 07/26/1970    Years since quitting: 49.9  . Smokeless tobacco: Former Systems developer    Types: Chew    Quit date: 05/05/2002  Vaping Use  . Vaping Use: Never used  Substance and Sexual Activity  . Alcohol use: Never    Alcohol/week: 0.0 standard drinks  . Drug use: Never  . Sexual activity: Not Currently  Other Topics Concern  . Not on file  Social History Narrative   Walks on the treadmill every other day at the [facility] for the last couple of weeks.         Social Determinants of Health   Financial Resource Strain:   . Difficulty of Paying Living  Expenses: Not on file  Food Insecurity:   . Worried About Charity fundraiser in the Last Year: Not on file  . Ran Out of Food  in the Last Year: Not on file  Transportation Needs:   . Lack of Transportation (Medical): Not on file  . Lack of Transportation (Non-Medical): Not on file  Physical Activity:   . Days of Exercise per Week: Not on file  . Minutes of Exercise per Session: Not on file  Stress:   . Feeling of Stress : Not on file  Social Connections:   . Frequency of Communication with Friends and Family: Not on file  . Frequency of Social Gatherings with Friends and Family: Not on file  . Attends Religious Services: Not on file  . Active Member of Clubs or Organizations: Not on file  . Attends Archivist Meetings: Not on file  . Marital Status: Not on file  Intimate Partner Violence:   . Fear of Current or Ex-Partner: Not on file  . Emotionally Abused: Not on file  . Physically Abused: Not on file  . Sexually Abused: Not on file     Review of Systems    General:  No chills, fever, night sweats or weight changes.  Cardiovascular:  No chest pain, dyspnea on exertion, edema, orthopnea, palpitations, paroxysmal nocturnal dyspnea. Dermatological: No rash, lesions/masses Respiratory: No cough, dyspnea Urologic: No hematuria, dysuria Abdominal:   No nausea, vomiting, diarrhea, bright red blood per rectum, melena, or hematemesis Neurologic:  No visual changes, wkns, changes in mental status. All other systems reviewed and are otherwise negative except as noted above.  Physical Exam    VS:  BP 121/66   Pulse (!) 52   Ht _0  (1.676 m)   Wt 180 lb (81.6 kg)   BMI 29.05 kg/m  , BMI Body mass index is 29.05 kg/m. GEN: Well nourished, well developed, in no acute distress. HEENT: normal. Neck: Supple, no JVD, carotid bruits, or masses. Cardiac: RRR, no murmurs, rubs, or gallops. No clubbing, cyanosis, edema.  Radials/DP/PT 2+ and equal bilaterally.  Respiratory:   Respirations regular and unlabored, clear to auscultation bilaterally. GI: Soft, nontender, nondistended, BS + x 4. MS: no deformity or atrophy. Skin: warm and dry, no rash. Neuro:  Strength and sensation are intact. Psych: Normal affect.  Accessory Clinical Findings    Recent Labs: 06/06/2020: BUN 15; Creatinine, Ser 1.52; Hemoglobin 12.2; Platelets 205; Potassium 4.2; Sodium 140   Recent Lipid Panel    Component Value Date/Time   CHOL 150 06/05/2020 0023   TRIG 240 (H) 06/05/2020 0023   HDL 28 (L) 06/05/2020 0023   CHOLHDL 5.4 06/05/2020 0023   VLDL 48 (H) 06/05/2020 0023   LDLCALC 74 06/05/2020 0023    ECG personally reviewed by me today-none today.  Echocardiogram 06/06/2020 IMPRESSIONS    1. Left ventricular ejection fraction, by estimation, is 55%. The left  ventricle demonstrates regional wall motion abnormalities with basal  inferior and basal inferoseptal akinesis. Left ventricular diastolic  parameters are consistent with Grade II  diastolic dysfunction (pseudonormalization). LV systolic function is  somewhat improved compared to echo yesterday.  2. Right ventricular systolic function is mildly reduced. The right  ventricular size is mildly enlarged.  3. Left atrial size was mildly dilated.  4. The mitral valve is normal in structure. Trivial mitral valve  regurgitation. No evidence of mitral stenosis.  5. The aortic valve is tricuspid. Aortic valve regurgitation is not  visualized. Mild aortic valve sclerosis is present, with no evidence of  aortic valve stenosis.  6. Limited echo   Cardiac catheterization 06/05/2020  Ost LM to Mid LM lesion is  95% stenosed.  Ost Cx to Prox Cx lesion is 99% stenosed.  Prox LAD lesion is 90% stenosed.  Previously placed Prox RCA to Mid RCA stent (unknown type) is widely patent.  Ost RCA to Prox RCA lesion is 95% stenosed.  RPDA-1 lesion is 70% stenosed.  RPDA-2 lesion is 70% stenosed.   Kenneth [name] is  a 79 y.o. male  PROCEDURE DESCRIPTION:   The patient was brought to the second floor [facility]iac cath lab in the postabsorptive state. He was premedicated with IV Versed and fentanyl. His right groinwas prepped and shaved in usual sterile fashion. Xylocaine 1% was used for local anesthesia. A 5 French sheath was inserted into the right common femoral  artery using standard Seldinger technique.  Ultrasound was used to identify the right common femoral artery and guide access.  A digital image was captured and placed in the patient's chart.  5 French right left Judkins diagnostic catheters on 5 French pigtail catheter and no torque guide catheter was used for selective coronary angiography, selective IMA graft angiography and left heart pressures.  Isovue dye was used for the entirety of the case.  Retrograde aorta, ventricular and pullback pressures were recorded.  Patient received a total of 12,000 as of heparin with an ACT of approximately 270.  He received an additional 300 mg of p.o. Plavix and was already on aspirin and Plavix prior to admission.  Isovue dye was used for the entirety of the intervention.  Retroaortic pressures monitored in the case.  Using a 6 [place] JR4 guide catheter, 0.14/180 cm [place] and a 2 mm x 12 mm balloon the proximal RCA was easily wired and predilated.  Following this a 2.75 mm x 15 mm long Medtronic resolute Onyx drug-eluting stent was then carefully positioned across the proximal RCA across the diseased segment and deployed at 14 atm.  It was postdilated with a 3 mm x 12 mm noncompliant balloon at 14 atm (3.1 mm) resulting in reduction of a 95% ulcerated proximal dominant RCA stenosis to 0% residual.  Patient tolerated procedure well.  The guidewire and catheter were removed.  The sheath was secured.   IMPRESSION: Successful proximal RCA PCI drug-eluting stenting in the setting of non-STEMI.  The previously placed stent in the mid RCA was widely patent.   This was placed in [place] in August of this year.  The sequential LIMA to the diagonal and LAD was patent.  The RIMA was not well visualized.  His LVEDP was 21.  The sheath will be removed once ACT falls below 170 and pressure held.  Patient be hydrated overnight, discharged home in the morning.  He left the lab in stable condition.   Diagnostic Dominance: Right  Intervention    Assessment & Plan   1.  Coronary artery disease status post NSTEMI-no chest pain today.  Underwent cardiac catheterization 06/05/2020 and received PCI with DES x1 to his proximal RCA.  Statin intolerant Continue aspirin, Plavix, Vascepa, amlodipine, ezetimibe, losartan, metoprolol, nitroglycerin Heart healthy low-sodium diet-salty 6 given Increase physical activity as tolerated Refer to lipid clinic for PCSK9 inhibitor  Hyperlipidemia-LDL 74 Continue Vascepa, ezetimibe Heart healthy low-sodium high-fiber diet Increase physical activity as tolerated Refer to lipid clinic  Essential hypertension-BP today 121/66.  Well-controlled at home Continue losartan, metoprolol Heart healthy low-sodium diet-salty 6 given Increase physical activity as tolerated  Ischemic cardiomyopathy-no increased DOE or activity intolerance today.  Echocardiogram showed LVEF of 40-45% with basal to mid inferior akinesis and  inferior lateral hypokinesis as well as inferior septal hypokinesis and G2 DD.  Previous LVEF 60-65% 8/21. Continue losartan Repeat echocardiogram in 3 months.  CKD stage III-creatinine 1.52 on 06/06/2020 which is at baseline. Losartan restarted prior to hospital discharge. Follows with PCP  Diabetes mellitus type 2-A1c 6.2. Metformin recommended with possible addition of Jardiance. Follows with PCP.  Disposition: Follow-up with Dr. [name] in 3 months.  Jossie Ng. Samya Siciliano NP-C    06/13/2020, 2:17 PM [facility] [place] [address] Office [phone] Fax [phone]  Notice: This dictation was prepared with Dragon dictation along with smaller phrase technology. Any transcriptional errors that result from this process are unintentional and may not be corrected upon review.

## 2020-06-13 ENCOUNTER — Ambulatory Visit: Payer: Medicare HMO | Admitting: General Practice

## 2020-06-13 ENCOUNTER — Other Ambulatory Visit: Payer: Self-pay

## 2020-06-13 ENCOUNTER — Encounter: Payer: Self-pay | Admitting: General Practice

## 2020-06-13 VITALS — BP 121/66 | HR 52 | Ht 66.0 in | Wt 180.0 lb

## 2020-06-13 DIAGNOSIS — N183 Chronic kidney disease, stage 3 unspecified: Secondary | ICD-10-CM | POA: Diagnosis not present

## 2020-06-13 DIAGNOSIS — E782 Mixed hyperlipidemia: Secondary | ICD-10-CM | POA: Diagnosis not present

## 2020-06-13 DIAGNOSIS — I10 Essential (primary) hypertension: Secondary | ICD-10-CM | POA: Diagnosis not present

## 2020-06-13 DIAGNOSIS — E0800 Diabetes mellitus due to underlying condition with hyperosmolarity without nonketotic hyperglycemic-hyperosmolar coma (NKHHC): Secondary | ICD-10-CM

## 2020-06-13 DIAGNOSIS — I255 Ischemic cardiomyopathy: Secondary | ICD-10-CM | POA: Diagnosis not present

## 2020-06-13 DIAGNOSIS — I2510 Atherosclerotic heart disease of native coronary artery without angina pectoris: Secondary | ICD-10-CM | POA: Diagnosis not present

## 2020-06-13 NOTE — Patient Instructions (Signed)
Medication Instructions:  Your physician recommends that you continue on your current medications as directed. Please refer to the Current Medication list given to you today.  *If you need a refill on your cardiac medications before your next appointment, please call your pharmacy*   Lab Work: None today If you have labs (blood work) drawn today and your tests are completely normal, you will receive your results only by: Marland Kitchen MyChart Message (if you have MyChart) OR . A paper copy in the mail If you have any lab test that is abnormal or we need to change your treatment, we will call you to review the results.   Testing/Procedures: Your physician has requested that you have an echocardiogram (in 3 months JUST before visit). Echocardiography is a painless test that uses sound waves to create images of your heart. It provides your doctor with information about the size and shape of your heart and how well your heart's chambers and valves are working. This procedure takes approximately one hour. There are no restrictions for this procedure.     Follow-Up: At [facility], you and your health needs are our priority.  As part of our continuing mission to provide you with exceptional heart care, we have created designated Provider Care Teams.  These Care Teams include your primary Cardiologist (physician) and Advanced Practice Providers (APPs -  Physician Assistants and Nurse Practitioners) who all work together to provide you with the care you need, when you need it.  We recommend signing up for the patient portal called "MyChart".  Sign up information is provided on this After Visit Summary.  MyChart is used to connect with patients for Virtual Visits (Telemedicine).  Patients are able to view lab/test results, encounter notes, upcoming appointments, etc.  Non-urgent messages can be sent to your provider as well.   To learn more about what you can do with MyChart, go to [email or web address].     Your next appointment:   3 month(s)  The format for your next appointment:   In Person  Provider:   Carlyle Dolly, MD   Other Instructions You have been referred to the [facility]. They will call you for an apt.   Follow "[place]" brochure given to you today.   Thank you for choosing [facility] !

## 2020-06-25 ENCOUNTER — Other Ambulatory Visit: Payer: Medicare HMO

## 2020-06-27 ENCOUNTER — Ambulatory Visit: Payer: Medicare HMO | Admitting: Family Medicine

## 2020-07-01 ENCOUNTER — Ambulatory Visit: Payer: Medicare HMO

## 2020-07-04 ENCOUNTER — Other Ambulatory Visit: Payer: Self-pay

## 2020-07-04 ENCOUNTER — Telehealth (INDEPENDENT_AMBULATORY_CARE_PROVIDER_SITE_OTHER): Payer: Medicare HMO | Admitting: Pharmacist

## 2020-07-04 DIAGNOSIS — E782 Mixed hyperlipidemia: Secondary | ICD-10-CM

## 2020-07-04 DIAGNOSIS — T466X5A Adverse effect of antihyperlipidemic and antiarteriosclerotic drugs, initial encounter: Secondary | ICD-10-CM

## 2020-07-04 DIAGNOSIS — G720 Drug-induced myopathy: Secondary | ICD-10-CM

## 2020-07-04 MED ORDER — PRALUENT 75 MG/ML ~~LOC~~ SOAJ: 1.0000 "pen " | SUBCUTANEOUS | 11 refills | Status: DC

## 2020-07-04 MED ORDER — OMEGA-3-ACID ETHYL ESTERS 1 G PO CAPS: 2.0000 g | ORAL_CAPSULE | Freq: Two times a day (BID) | ORAL | 3 refills | Status: DC

## 2020-07-04 NOTE — Progress Notes (Signed)
Patient ID: QUAN CYBULSKI                 DOB: 09-26-1940                    MRN: [id number]     HPI: Kenneth [name] is a 79 y.o. male patient of Dr [name] referred to lipid clinic by Coletta Memos, NP. PMH is significant for CAD s/p CABG in 2003, STEMI s/p DES to RCA in 02/2020, HTN, CVA in 2019, PVD, and former tobacco abuse. He presented again 05/2020 with chest pain and elevated troponin, cardiac cath showed severe stenosis of prox RCA and he underwent DES with PCI to RCA (previously placed mid RCA stent was patent), LVEF 40-45% with plans to recheck echo in 3 months. Pt is statin intolerant and was referred to lipid clinic to discuss PCSK9i therapy.  Visit today conducted via telephone with pt and his wife. Pt reports tolerating ezetimibe well. Pt experienced a rash on his trunk and arms and swelling in his lips on multiple different statins. He previously took Lovaza and didn't have any trouble tolerating it, had stopped therapy because a provider told him he didn't need it anymore.  Current Medications: ezetimibe 10mg  daily Intolerances: pravastatin 20mg  daily, fluvastatin 80mg  daily - rash and swelling in his lips, Lovaza 2g BID, Niaspan 500mg  daily Risk Factors: CAD s/p CABG, NTSEMI, PVD, CVA, age, HTN, former tobacco abuse LDL goal: 55mg /dL  Diet: Cut back on salt and fried foods. Likes fruit, low fat dairy. No alcohol.  Exercise: Tries to walk, does have a trach which limits his air/walking capabilities.  Family History: Father and brother with heart disease.  Social History: Former smoker 1 PPD for 15 years, quit in 1972.  Labs: 06/05/20: TC 150, TG 240, HDL 28, LDL 74 (ezetimibe 10mg  daily)  Past Medical History:  Diagnosis Date  . Acute ST elevation myocardial infarction (STEMI) of inferior wall [facility]) 03/01/2020   [facility]  . Anxiety   . 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 ASCVD (arteriosclerotic cardiovascular disease)   . Chronic lower back pain     "have it at night" (05/04/2018)  . Coronary artery disease    a. 2003: s/p CABG x3V  b. 2007: cath with patent bypass grafts.  c. 09/2016: [place] with cath showing patent bypass grafts, medical Rx recommended  . CVA (cerebral vascular accident) ([place]) 10/2017   "little numb on my left face since" (05/04/2018)  . Depression   . GERD (gastroesophageal reflux disease)   . High triglycerides   . History of kidney stones   . Hypertension   . 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 Malodorous urine    "in the last 5wks; since I had trach put in" (05/04/2018)  . Peripheral vascular disease ([place])   . Stroke [facility])    "he's had several little strokes; many that he wasn't aware of" (05/04/2018)  . Tobacco abuse   . Tracheal stenosis     Current Outpatient Medications on File Prior to Visit  Medication Sig Dispense Refill  . acetaminophen (TYLENOL) 500 MG tablet Take 500 mg by mouth every 6 (six) hours as needed.    Marland Kitchen acetaminophen-codeine (TYLENOL #3) 300-30 MG tablet Take 1 tablet by mouth every 4 (four) hours as needed for moderate pain.     Marland Kitchen albuterol (PROVENTIL) (2.5 MG/3ML) 0.083% nebulizer solution Take 3 mLs (2.5 mg total) by nebulization 3 (three) times daily. X 5  days and then q 6 hrs prn after that (Patient taking differently: Take 2.5 mg by nebulization 3 (three) times daily. X 5 days and then every 6 hrs as needed after that) 75 mL 12  . amLODipine (NORVASC) 10 MG tablet Take 1 tablet by mouth once daily 90 tablet 3  . aspirin EC 81 MG tablet Take 81 mg by mouth daily. Swallow whole.    . clopidogrel (PLAVIX) 75 MG tablet Take 1 tablet by mouth once daily 90 tablet 3  . Dextromethorphan-guaiFENesin (TUSSIN DM MAX PO) Take by mouth. As directed as needed     . doxylamine, Sleep, (UNISOM) 25 MG tablet Take 25 mg by mouth at bedtime as needed.    . ezetimibe (ZETIA) 10 MG tablet Take 1 tablet by mouth once daily 90 tablet 1  . losartan (COZAAR) 50 MG tablet Take 50 mg by mouth daily.    .  metoprolol tartrate (LOPRESSOR) 25 MG tablet Take 25 mg by mouth 2 (two) times daily.     . nitroGLYCERIN (NITROSTAT) 0.4 MG SL tablet Place 1 tablet (0.4 mg total) under the tongue every 5 (five) minutes as needed for chest pain. 25 tablet 3  . pantoprazole (PROTONIX) 40 MG tablet Take 1 tablet (40 mg total) by mouth daily. 90 tablet 3  . PARoxetine (PAXIL) 20 MG tablet Take 20 mg by mouth at bedtime.      No current facility-administered medications on file prior to visit.    Allergies  Allergen Reactions  . Nifedipine Other (See Comments)  . Lorazepam Other (See Comments)    REACTION: Alters mental status. "Turns into maniac" REACTION: Alters mental status. "Turns into maniac" REACTION: Alters mental status. "Turns into maniac"  . Other     REACTION: Unknown to patient. States that MD states allergy per medical records  . Penicillins Hives    Has patient had a PCN reaction causing immediate rash, facial/tongue/throat swelling, SOB or lightheadedness with hypotension: Yes Has patient had a PCN reaction causing severe rash involving mucus membranes or skin necrosis: No Has patient had a PCN reaction that required hospitalization No Has patient had a PCN reaction occurring within the last 10 years: No If all of the above answers are "NO", then may proceed with Cephalosporin use.  HAS TOLERATED: cephalexin  . Sulfacetamide     REACTION: Unknown to patient. States that MD states allergy per medical records  . Sulfonamide Derivatives Other (See Comments)    REACTION: Unknown to patient. States that MD states allergy per medical records  . Erythromycin Rash  . Statins Itching and Rash  . Vancomycin Itching    Assessment/Plan:  1. Hyperlipidemia - LDL 74 on ezetimibe 10mg  daily, above goal < 55 given recurrent ASCVD. TG also above goal <150 at 240. Pt experienced swelling in his lips and rash on his torso with multiple statins. Discussed PCSK9i therapy which pt is agreeable to try.  Prior authorization has been submitted for Praluent 75mg  Q2W (preferred PCSK9i on his plan), they are ok with $47/month copay. Unfortunately, Vascepa and Lovaza are both a Tier for on his plan for this and next year ($100/month copay). Fenofibrate is currently Tier 3 but is moving to Tier 2 in January when it will become free. Will plan to start fenofibrate at that time when more affordable. He has been making improvements in his diet as well since his most recent MI. Can recheck lipids at Feb 2022 visit with APP.  Will plan to coach pt  over the phone for first injection as they are unable to come to [facility] for injection training.  Pernie Grosso E. Countess Biebel, PharmD, BCACP, [facility] [address] [place],  [address] Phone: [phone]; Fax: [phone] 07/04/2020 2:49 PM

## 2020-07-04 NOTE — Patient Instructions (Addendum)
Below instructions have been mailed to patient:  [name]:  Please start Praluent 75mg  - 1 injection into the fatty tissue in your stomach every 14 days. Store the medication in the fridge until you are ready to use it. Each pre-filled pen is a single use dose. I have included a few pages that show you how to give the injections along with some other instructions. Praluent will lower your LDL cholesterol by 60%. This will help bring your LDL to goal < 55 (it's currently at 74 on ezetimibe - please continue taking this for now).  Your triglycerides are also a bit high at 240. Your goal is < 150. In January when your insurance plan for next year kicks in, we'll start a medication called fenofibrate which helps to lower your triglycerides by 30-50%. It will be free on your insurance plan in 2022 but unfortunately is still a bit more expensive at $47 for the rest of the 2021 year.  You are scheduled to see a PA [place] on 09/09/20 at the [place] office at 1:30pm. They can recheck your cholesterol at that time to see how your numbers are looking.  Please give me a call if you have any questions or any trouble tolerating the medications. My name is Jinny Blossom, I'm one of the pharmacists that works with Dr [name] but I'm located in the [place] office. My direct # is [phone].

## 2020-07-07 DIAGNOSIS — Z789 Other specified health status: Secondary | ICD-10-CM | POA: Diagnosis not present

## 2020-07-07 DIAGNOSIS — L821 Other seborrheic keratosis: Secondary | ICD-10-CM | POA: Diagnosis not present

## 2020-07-07 DIAGNOSIS — L298 Other pruritus: Secondary | ICD-10-CM | POA: Diagnosis not present

## 2020-07-07 DIAGNOSIS — L570 Actinic keratosis: Secondary | ICD-10-CM | POA: Diagnosis not present

## 2020-07-07 DIAGNOSIS — L853 Xerosis cutis: Secondary | ICD-10-CM | POA: Diagnosis not present

## 2020-07-07 DIAGNOSIS — L538 Other specified erythematous conditions: Secondary | ICD-10-CM | POA: Diagnosis not present

## 2020-07-07 DIAGNOSIS — Z85828 Personal history of other malignant neoplasm of skin: Secondary | ICD-10-CM | POA: Diagnosis not present

## 2020-08-04 ENCOUNTER — Telehealth: Payer: Self-pay | Admitting: *Deleted

## 2020-08-04 NOTE — Telephone Encounter (Signed)
   [facility] Pre-operative Risk Assessment    HEARTCARE STAFF: - Please ensure there is not already an duplicate clearance open for this procedure. - Under Visit Info/Reason for Call, type in Other and utilize the format Clearance MM/DD/YY or Clearance TBD. Do not use dashes or single digits. - If request is for dental extraction, please clarify the # of teeth to be extracted.  Request for surgical clearance:  1. What type of surgery is being performed? Replace dental implants  2. When is this surgery scheduled? TBD  3. What type of clearance is required (medical clearance vs. Pharmacy clearance to hold med vs. Both)? both  4. Are there any medications that need to be held prior to surgery and how long? plavix  5. Practice name and name of physician performing surgery? [facility]  6. What is the office phone number? [phone]   7.   What is the office fax number? [phone]  8.   Anesthesia type (None, local, MAC, general) ?    Marlou Sa 08/04/2020, 4:05 PM  _________________________________________________________________   (provider comments below)

## 2020-08-05 NOTE — Telephone Encounter (Signed)
I spoke with Dr. Cheri Fowler office, dental procedure is not urgent.  If it can be delayed, I suspect it should be delayed at this point since the patient just had a RCA stent placed 06/05/2020.  We will forward to Dr. [name], however procedure is tentatively delayed at this point.

## 2020-08-06 NOTE — Telephone Encounter (Signed)
Yes would need to be delayed min of 6 months, potentially 1 year depending on the urgency   Zandra Abts MD

## 2020-09-04 ENCOUNTER — Ambulatory Visit (HOSPITAL_COMMUNITY): Admission: RE | Admit: 2020-09-04 | Payer: PRIVATE HEALTH INSURANCE | Source: Ambulatory Visit

## 2020-09-09 ENCOUNTER — Ambulatory Visit: Payer: Medicare HMO | Admitting: Student

## 2020-09-15 NOTE — Progress Notes (Signed)
Cardiology Office Note:    Date:  09/16/2020   ID:  Kenneth [name], DOB 03-16-1941, MRN [id number]  PCP:  Kenneth Noble, MD  Cardiologist:  Kenneth Dolly, MD   Referring MD: Kenneth Noble, MD   Chief Complaint  Patient presents with  . Follow-up  CAD, CHF  History of Present Illness:    Kenneth [name] is a 80 y.o. male with a hx of carotid artery stenosis, TIA/ischemic stroke, former smoker, CAD, HTN, and HLD. He has a history of throat cancer and is s/p radiation/chemo with chronic tracheostomy.  He is s/p CABG x 3 in 2003. He suffered a CVA in 2019. He has a STEMI s/p DES to RCA in Aug 2021. He presented back with NSTEMI and had repeat heart cath with DES to RCA. LVED estimated at 40-45%. He was placed on GDMT with plans to recheck an echo in 3 months. He was referred to lipid clinic for consideration of PCSK9i.  He is intolerant to multiple statins. He was started on praluent on 07/04/20. A1c during last hospitalization was 6.2%- this was a new finding with mention of future consideration for SGLT2i.   He presents today for follow up and to check lipids. He is not fasting. He reports one episode last week of left leg swelling at night, completely resolved the next morning. I discussed daily weights and low sodium diet. He appears euvolemic today.   Past Medical History:  Diagnosis Date  . Acute ST elevation myocardial infarction (STEMI) of inferior wall [facility]) 03/01/2020   [facility]  . Anxiety   . 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 ASCVD (arteriosclerotic cardiovascular disease)   . Chronic lower back pain    "have it at night" (05/04/2018)  . Coronary artery disease    a. 2003: s/p CABG x3V  b. 2007: cath with patent bypass grafts.  c. 09/2016: [place] with cath showing patent bypass grafts, medical Rx recommended  . CVA (cerebral vascular accident) ([place]) 10/2017   "little numb on my left face since" (05/04/2018)  . Depression   . GERD (gastroesophageal reflux  disease)   . High triglycerides   . History of kidney stones   . Hypertension   . 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 Malodorous urine    "in the last 5wks; since I had trach put in" (05/04/2018)  . Peripheral vascular disease ([place])   . Stroke [facility])    "he's had several little strokes; many that he wasn't aware of" (05/04/2018)  . Tobacco abuse   . Tracheal stenosis     Past Surgical History:  Procedure Laterality Date  . CAROTID STENT Right 06-13-12; 05/04/2018  . CAROTID STENT INSERTION N/A 06/13/2012   Procedure: CAROTID STENT INSERTION;  Surgeon: Kenneth Mitchell, MD;  Location: [facility] CATH LAB;  Service: Cardiovascular;  Laterality: N/A;  . CATARACT EXTRACTION, BILATERAL Bilateral   . COLONOSCOPY  11/10/2011   Dr. Gala [name]: hemorrhoids, tubular adenoma  . CORNEAL TRANSPLANT Bilateral   . CORONARY ARTERY BYPASS GRAFT  ~ 2003   "CABG X3"  . CORONARY STENT INTERVENTION N/A 06/05/2020   Procedure: CORONARY STENT INTERVENTION;  Surgeon: Kenneth Harp, MD;  Location: [place] CV LAB;  Service: Cardiovascular;  Laterality: N/A;  . EYE SURGERY    . INSERTION OF RETROGRADE CAROTID STENT Right 05/04/2018   Procedure: INSERTION OF RIGHT  CAROTID STENT using an ABBOT- XACT carotid stent system;  Surgeon: Kenneth Mitchell, MD;  Location: [place];  Service: Vascular;  Laterality: Right;  . LAPAROSCOPIC CHOLECYSTECTOMY  2007  . LEFT HEART CATH AND CORS/GRAFTS ANGIOGRAPHY N/A 10/07/2016   Procedure: Left Heart Cath and Cors/Grafts Angiography;  Surgeon: Kenneth Sine, MD;  Location: [place] CV LAB;  Service: Cardiovascular;  Laterality: N/A;  . LEFT HEART CATH AND CORS/GRAFTS ANGIOGRAPHY N/A 06/05/2020   Procedure: LEFT HEART CATH AND CORS/GRAFTS ANGIOGRAPHY;  Surgeon: Kenneth Harp, MD;  Location: [place] CV LAB;  Service: Cardiovascular;  Laterality: N/A;  . PARTIAL LARYNGECTOMY  1998  . TRACHEOSTOMY  03/13/2018   "@ [facility]"    Current Medications: Current Meds  Medication Sig   . acetaminophen (TYLENOL) 500 MG tablet Take 500 mg by mouth every 6 (six) hours as needed.  Marland Kitchen albuterol (PROVENTIL) (2.5 MG/3ML) 0.083% nebulizer solution Take 3 mLs (2.5 mg total) by nebulization 3 (three) times daily. X 5 days and then q 6 hrs prn after that (Patient taking differently: Take 2.5 mg by nebulization 3 (three) times daily. X 5 days and then every 6 hrs as needed after that)  . Alirocumab (PRALUENT) 75 MG/ML SOAJ Inject 1 pen into the skin every 14 (fourteen) days.  Marland Kitchen amLODipine (NORVASC) 10 MG tablet Take 1 tablet by mouth once daily  . aspirin EC 81 MG tablet Take 81 mg by mouth daily. Swallow whole.  . clopidogrel (PLAVIX) 75 MG tablet Take 1 tablet by mouth once daily  . Dextromethorphan-guaiFENesin (TUSSIN DM MAX PO) Take by mouth. As directed as needed   . doxylamine, Sleep, (UNISOM) 25 MG tablet Take 25 mg by mouth at bedtime as needed.  . ezetimibe (ZETIA) 10 MG tablet Take 1 tablet by mouth once daily  . losartan (COZAAR) 50 MG tablet Take 50 mg by mouth daily.  . metoprolol tartrate (LOPRESSOR) 25 MG tablet Take 25 mg by mouth 2 (two) times daily.   . nitroGLYCERIN (NITROSTAT) 0.4 MG SL tablet Place 1 tablet (0.4 mg total) under the tongue every 5 (five) minutes as needed for chest pain.  . pantoprazole (PROTONIX) 40 MG tablet Take 1 tablet (40 mg total) by mouth daily.  Marland Kitchen PARoxetine (PAXIL) 20 MG tablet Take 20 mg by mouth at bedtime.  Marland Kitchen spironolactone (ALDACTONE) 25 MG tablet Take 25 mg by mouth daily.     Allergies:   Nifedipine, Lorazepam, Other, Penicillins, Sulfacetamide, Sulfonamide derivatives, Erythromycin, Statins, and Vancomycin   Social History   Socioeconomic History  . Marital status: Married    Spouse name: Not on file  . Number of children: Not on file  . Years of education: Not on file  . Highest education level: Not on file  Occupational History  . Occupation: retired    Comment: Warden/ranger  Tobacco Use  . Smoking status: Former Smoker     Packs/day: 1.00    Years: 15.00    Pack years: 15.00    Types: Cigarettes    Start date: 04/22/1953    Quit date: 07/26/1970    Years since quitting: 50.1  . Smokeless tobacco: Former Systems developer    Types: Chew    Quit date: 05/05/2002  Vaping Use  . Vaping Use: Never used  Substance and Sexual Activity  . Alcohol use: Never    Alcohol/week: 0.0 standard drinks  . Drug use: Never  . Sexual activity: Not Currently  Other Topics Concern  . Not on file  Social History Narrative   Walks on the treadmill every other day at the [facility] for the last couple  of weeks.         Social Determinants of Health   Financial Resource Strain: Not on file  Food Insecurity: Not on file  Transportation Needs: Not on file  Physical Activity: Not on file  Stress: Not on file  Social Connections: Not on file     Family History: The patient's family history includes Cancer in his brother and brother; Dementia in his mother; Heart disease in his brother and father; Hyperlipidemia in his brother. There is no history of Colon cancer.  ROS:   Please see the history of present illness.     All other systems reviewed and are negative.  EKGs/Labs/Other Studies Reviewed:    The following studies were reviewed today:  Left heart cath 05/2020:   Ost LM to Mid LM lesion is 95% stenosed.  Ost Cx to Prox Cx lesion is 99% stenosed.  Prox LAD lesion is 90% stenosed.  Previously placed Prox RCA to Mid RCA stent (unknown type) is widely patent.  Ost RCA to Prox RCA lesion is 95% stenosed.  RPDA-1 lesion is 70% stenosed.  RPDA-2 lesion is 70% stenosed.   Echocardiogram 06/05/20:  1. Left ventricular ejection fraction, by estimation, is 40 to 45%. The  left ventricle has mildly decreased function. The left ventricle  demonstrates regional wall motion abnormalities with basal to mid inferior  akinesis, inferolateral hypokinesis, and  inferoseptal hypokinesis. Left ventricular diastolic parameters  are  consistent with Grade II diastolic dysfunction (pseudonormalization).  2. Right ventricular systolic function is moderately reduced. The right  ventricular size is mildly enlarged. There is normal pulmonary artery  systolic pressure. The estimated right ventricular systolic pressure is  55.7 mmHg.  3. The mitral valve is normal in structure. Trivial mitral valve  regurgitation. No evidence of mitral stenosis.  4. The aortic valve is tricuspid. Aortic valve regurgitation is not  visualized. Mild to moderate aortic valve sclerosis/calcification is  present, without any evidence of aortic stenosis.  5. The inferior vena cava is normal in size with <50% respiratory  variability, suggesting right atrial pressure of 8 mmHg.    EKG:  EKG is not ordered today.    Recent Labs: 06/06/2020: BUN 15; Creatinine, Ser 1.52; Hemoglobin 12.2; Platelets 205; Potassium 4.2; Sodium 140  Recent Lipid Panel    Component Value Date/Time   CHOL 150 06/05/2020 0023   TRIG 240 (H) 06/05/2020 0023   HDL 28 (L) 06/05/2020 0023   CHOLHDL 5.4 06/05/2020 0023   VLDL 48 (H) 06/05/2020 0023   LDLCALC 74 06/05/2020 0023    Physical Exam:    VS:  BP 128/60   Pulse 60   Ht 5\' 6"  (1.676 m)   Wt 182 lb (82.6 kg)   SpO2 95%   BMI 29.38 kg/m     Wt Readings from Last 3 Encounters:  09/16/20 182 lb (82.6 kg)  06/13/20 180 lb (81.6 kg)  06/06/20 178 lb 1.4 oz (80.8 kg)     GEN: Well nourished, well developed in no acute distress HEENT: Normal NECK: No JVD; No carotid bruits - trach in place LYMPHATICS: No lymphadenopathy CARDIAC: RRR, no murmurs, rubs, gallops RESPIRATORY:  Clear to auscultation without rales, wheezing or rhonchi  ABDOMEN: Soft, non-tender, non-distended MUSCULOSKELETAL:  No edema; No deformity  SKIN: Warm and dry NEUROLOGIC:  Alert and oriented x 3 PSYCHIATRIC:  Normal affect   ASSESSMENT:    1. CAD in native artery   2. Hx of CABG   3. CAD S/P percutaneous  coronary  angioplasty   4. Mixed hyperlipidemia   5. Essential hypertension   6. Ischemic cardiomyopathy   7. Chronic combined systolic and diastolic heart failure ([place])   8. Prediabetes   9. Medication management   10. Carotid stenosis, bilateral    PLAN:    In order of problems listed above:  CAD s/p CABG with subsequent PCI - continue ASA and plavix x 12 months - continue BB, ARB, spiro - no chest pain, although he is sedentary   Hyperlipidemia with LDL goal < 70 06/05/2020: Cholesterol 150; HDL 28; LDL Cholesterol 74; Triglycerides 240; VLDL 48 Started on praluent 07/04/20 - will recheck fasting lipid profile   Hypertension - medications as above - pressure well-controlled   Prediabetes - A1c 6.2% 3 months ago - will check this  - if still elevated, he would benefit from SGLT2i   Ischemic cardiomyopathy Chronic systolic and diastolic heart failure - EF was 40-45% following most recent NSTEMI - limited echo same admission with EF 55% - grade 2 DD - continue to follow a low sodium diet - continue BB, ARB, spiro    Carotid artery stenosis - last study in 2019 with 40-59% left ICA.  - consider repeating a study at next visit   Medication Adjustments/Labs and Tests Ordered: Current medicines are reviewed at length with the patient today.  Concerns regarding medicines are outlined above.  Orders Placed This Encounter  Procedures  . Lipid Profile  . Hepatic function panel  . HgB A1c   No orders of the defined types were placed in this encounter.   Signed, Ledora Bottcher, [place]  09/16/2020 11:41 AM    [facility]

## 2020-09-16 ENCOUNTER — Ambulatory Visit: Payer: Medicare HMO | Admitting: Physician Assistant

## 2020-09-16 ENCOUNTER — Encounter: Payer: Self-pay | Admitting: Physician Assistant

## 2020-09-16 ENCOUNTER — Other Ambulatory Visit: Payer: Self-pay

## 2020-09-16 VITALS — BP 128/60 | HR 60 | Ht 66.0 in | Wt 182.0 lb

## 2020-09-16 DIAGNOSIS — I6523 Occlusion and stenosis of bilateral carotid arteries: Secondary | ICD-10-CM

## 2020-09-16 DIAGNOSIS — Z79899 Other long term (current) drug therapy: Secondary | ICD-10-CM

## 2020-09-16 DIAGNOSIS — I255 Ischemic cardiomyopathy: Secondary | ICD-10-CM

## 2020-09-16 DIAGNOSIS — I5042 Chronic combined systolic (congestive) and diastolic (congestive) heart failure: Secondary | ICD-10-CM

## 2020-09-16 DIAGNOSIS — I2510 Atherosclerotic heart disease of native coronary artery without angina pectoris: Secondary | ICD-10-CM

## 2020-09-16 DIAGNOSIS — I10 Essential (primary) hypertension: Secondary | ICD-10-CM

## 2020-09-16 DIAGNOSIS — Z951 Presence of aortocoronary bypass graft: Secondary | ICD-10-CM | POA: Diagnosis not present

## 2020-09-16 DIAGNOSIS — E782 Mixed hyperlipidemia: Secondary | ICD-10-CM | POA: Diagnosis not present

## 2020-09-16 DIAGNOSIS — R7303 Prediabetes: Secondary | ICD-10-CM

## 2020-09-16 DIAGNOSIS — Z9861 Coronary angioplasty status: Secondary | ICD-10-CM

## 2020-09-16 NOTE — Patient Instructions (Signed)
Medication Instructions:  Your physician recommends that you continue on your current medications as directed. Please refer to the Current Medication list given to you today.  *If you need a refill on your cardiac medications before your next appointment, please call your pharmacy*   Lab Work: LFT, FLP, A1C If you have labs (blood work) drawn today and your tests are completely normal, you will receive your results only by: Marland Kitchen MyChart Message (if you have MyChart) OR . A paper copy in the mail If you have any lab test that is abnormal or we need to change your treatment, we will call you to review the results.   Testing/Procedures: None    Follow-Up: At [facility], you and your health needs are our priority.  As part of our continuing mission to provide you with exceptional heart care, we have created designated Provider Care Teams.  These Care Teams include your primary Cardiologist (physician) and Advanced Practice Providers (APPs -  Physician Assistants and Nurse Practitioners) who all work together to provide you with the care you need, when you need it.  We recommend signing up for the patient portal called "MyChart".  Sign up information is provided on this After Visit Summary.  MyChart is used to connect with patients for Virtual Visits (Telemedicine).  Patients are able to view lab/test results, encounter notes, upcoming appointments, etc.  Non-urgent messages can be sent to your provider as well.   To learn more about what you can do with MyChart, go to [email or web address].    Your next appointment:   6 month(s)  The format for your next appointment:   In Person  Provider:   Carlyle Dolly, MD   Other Instructions Your physician recommends that you weigh, daily, at the same time every day, and in the same amount of clothing. Please use the toilet before weighing yourself to measure accurately.  Please record your daily weights on the handout provided and bring it  to your next appointment.  If you gain 2-3 lb overnight or 5 lbs in week, please call our office.  Two Gram Sodium Diet 2000 mg  What is Sodium? Sodium is a mineral found naturally in many foods. The most significant source of sodium in the diet is table salt, which is about 40% sodium.  Processed, convenience, and preserved foods also contain a large amount of sodium.  The body needs only 500 mg of sodium daily to function,  A normal diet provides more than enough sodium even if you do not use salt.  Why Limit Sodium? A build up of sodium in the body can cause thirst, increased blood pressure, shortness of breath, and water retention.  Decreasing sodium in the diet can reduce edema and risk of heart attack or stroke associated with high blood pressure.  Keep in mind that there are many other factors involved in these health problems.  Heredity, obesity, lack of exercise, cigarette smoking, stress and what you eat all play a role.  General Guidelines:  Do not add salt at the table or in cooking.  One teaspoon of salt contains over 2 grams of sodium.  Read food labels  Avoid processed and convenience foods  Ask your dietitian before eating any foods not dicussed in the menu planning guidelines  Consult your physician if you wish to use a salt substitute or a sodium containing medication such as antacids.  Limit milk and milk products to 16 oz (2 cups) per day.  Shopping Hints:  READ  LABELS!! "Dietetic" does not necessarily mean low sodium.  Salt and other sodium ingredients are often added to foods during processing.   Menu Planning Guidelines Food Group Choose More Often Avoid  Beverages (see also the milk group All fruit juices, low-sodium, salt-free vegetables juices, low-sodium carbonated beverages Regular vegetable or tomato juices, commercially softened water used for drinking or cooking  Breads and Cereals Enriched white, wheat, rye and pumpernickel bread, hard rolls and dinner  rolls; muffins, cornbread and waffles; most dry cereals, cooked cereal without added salt; unsalted crackers and breadsticks; low sodium or homemade bread crumbs Bread, rolls and crackers with salted tops; quick breads; instant hot cereals; pancakes; commercial bread stuffing; self-rising flower and biscuit mixes; regular bread crumbs or cracker crumbs  Desserts and Sweets Desserts and sweets mad with mild should be within allowance Instant pudding mixes and cake mixes  Fats Butter or margarine; vegetable oils; unsalted salad dressings, regular salad dressings limited to 1 Tbs; light, sour and heavy cream Regular salad dressings containing bacon fat, bacon bits, and salt pork; snack dips made with instant soup mixes or processed cheese; salted nuts  Fruits Most fresh, frozen and canned fruits Fruits processed with salt or sodium-containing ingredient (some dried fruits are processed with sodium sulfites        Vegetables Fresh, frozen vegetables and low- sodium canned vegetables Regular canned vegetables, sauerkraut, pickled vegetables, and others prepared in brine; frozen vegetables in sauces; vegetables seasoned with ham, bacon or salt pork  Condiments, Sauces, Miscellaneous  Salt substitute with physician's approval; pepper, herbs, spices; vinegar, lemon or lime juice; hot pepper sauce; garlic powder, onion powder, low sodium soy sauce (1 Tbs.); low sodium condiments (ketchup, chili sauce, mustard) in limited amounts (1 tsp.) fresh ground horseradish; unsalted tortilla chips, pretzels, potato chips, popcorn, salsa (1/4 cup) Any seasoning made with salt including garlic salt, celery salt, onion salt, and seasoned salt; sea salt, rock salt, kosher salt; meat tenderizers; monosodium glutamate; mustard, regular soy sauce, barbecue, sauce, chili sauce, teriyaki sauce, steak sauce, Worcestershire sauce, and most flavored vinegars; canned gravy and mixes; regular condiments; salted snack foods, olives,  picles, relish, horseradish sauce, catsup   Food preparation: Try these seasonings Meats:    Pork Sage, onion Serve with applesauce  Chicken Poultry seasoning, thyme, parsley Serve with cranberry sauce  Lamb Curry powder, rosemary, garlic, thyme Serve with mint sauce or jelly  Veal Marjoram, basil Serve with current jelly, cranberry sauce  Beef Pepper, bay leaf Serve with dry mustard, unsalted chive butter  Fish [place] leaf, dill Serve with unsalted lemon butter, unsalted parsley butter  Vegetables:    Asparagus Lemon juice   Broccoli Lemon juice   Carrots Mustard dressing parsley, mint, nutmeg, glazed with unsalted butter and sugar   Green beans Marjoram, lemon juice, nutmeg,dill seed   Tomatoes Basil, marjoram, onion   Spice /blend for [employer]" 4 tsp ground thyme 1 tsp ground sage 3 tsp ground rosemary 4 tsp ground marjoram   Test your knowledge 1. A product that says "Salt Free" may still contain sodium. True or False 2. Garlic Powder and Hot Pepper Sauce an be used as alternative seasonings.True or False 3. Processed foods have more sodium than fresh foods.  True or False 4. Canned Vegetables have less sodium than froze True or False  WAYS TO DECREASE YOUR SODIUM INTAKE 1. Avoid the use of added salt in cooking and at the table.  Table salt (and other prepared seasonings which contain salt) is probably  one of the greatest sources of sodium in the diet.  Unsalted foods can gain flavor from the sweet, sour, and butter taste sensations of herbs and spices.  Instead of using salt for seasoning, try the following seasonings with the foods listed.  Remember: how you use them to enhance natural food flavors is limited only by your creativity... Allspice-Meat, fish, eggs, fruit, peas, red and yellow vegetables Almond Extract-Fruit baked goods Anise Seed-Sweet breads, fruit, carrots, beets, cottage cheese, cookies (tastes like licorice) Basil-Meat, fish, eggs, vegetables, rice, vegetables  salads, soups, sauces Bay Leaf-Meat, fish, stews, poultry Burnet-Salad, vegetables (cucumber-like flavor) Caraway Seed-Bread, cookies, cottage cheese, meat, vegetables, cheese, rice Cardamon-Baked goods, fruit, soups Celery Powder or seed-Salads, salad dressings, sauces, meatloaf, soup, bread.Do not use  celery salt Chervil-Meats, salads, fish, eggs, vegetables, cottage cheese (parsley-like flavor) Chili Power-Meatloaf, chicken cheese, corn, eggplant, egg dishes Chives-Salads cottage cheese, egg dishes, soups, vegetables, sauces Cilantro-Salsa, casseroles Cinnamon-Baked goods, fruit, pork, lamb, chicken, carrots Cloves-Fruit, baked goods, fish, pot roast, green beans, beets, carrots Coriander-Pastry, cookies, meat, salads, cheese (lemon-orange flavor) Cumin-Meatloaf, fish,cheese, eggs, cabbage,fruit pie (caraway flavor) [employer], fruit, eggs, fish, poultry, cottage cheese, vegetables Dill Seed-Meat, cottage cheese, poultry, vegetables, fish, salads, bread Fennel Seed-Bread, cookies, apples, pork, eggs, fish, beets, cabbage, cheese, Licorice-like flavor Garlic-(buds or powder) Salads, meat, poultry, fish, bread, butter, vegetables, potatoes.Do not  use garlic salt Ginger-Fruit, vegetables, baked goods, meat, fish, poultry Horseradish Root-Meet, vegetables, butter Lemon Juice or Extract-Vegetables, fruit, tea, baked goods, fish salads Mace-Baked goods fruit, vegetables, fish, poultry (taste like nutmeg) Maple Extract-Syrups Marjoram-Meat, chicken, fish, vegetables, breads, green salads (taste like Sage) Mint-Tea, lamb, sherbet, vegetables, desserts, carrots, cabbage Mustard, Dry or Seed-Cheese, eggs, meats, vegetables, poultry Nutmeg-Baked goods, fruit, chicken, eggs, vegetables, desserts Onion Powder-Meat, fish, poultry, vegetables, cheese, eggs, bread, rice salads (Do not use   Onion salt) Orange Extract-Desserts, baked goods Oregano-Pasta, eggs, cheese, onions, pork, lamb,  fish, chicken, vegetables, green salads Paprika-Meat, fish, poultry, eggs, cheese, vegetables Parsley Flakes-Butter, vegetables, meat fish, poultry, eggs, bread, salads (certain forms may   Contain sodium Pepper-Meat fish, poultry, vegetables, eggs Peppermint Extract-Desserts, baked goods Poppy Seed-Eggs, bread, cheese, fruit dressings, baked goods, noodles, vegetables, cottage  [employer], poultry, meat, fish, cauliflower, turnips,eggs bread Saffron-Rice, bread, veal, chicken, fish, eggs Sage-Meat, fish, poultry, onions, eggplant, tomateos, pork, stews Savory-Eggs, salads, poultry, meat, rice, vegetables, soups, pork Tarragon-Meat, poultry, fish, eggs, butter, vegetables (licorice-like flavor)  Thyme-Meat, poultry, fish, eggs, vegetables, (clover-like flavor), sauces, soups Tumeric-Salads, butter, eggs, fish, rice, vegetables (saffron-like flavor) Vanilla Extract-Baked goods, candy Vinegar-Salads, vegetables, meat marinades Walnut Extract-baked goods, candy  2. Choose your Foods Wisely   The following is a list of foods to avoid which are high in sodium:  Meats-Avoid all smoked, canned, salt cured, dried and kosher meat and fish as well as Anchovies   [employer] meats:Bologna, Liverwurst, Pastrami Canned meat or fish  Marinated herring Caviar    Pepperoni Corned Beef   Pizza Dried chipped beef  Salami Frozen breaded fish or meat Salt pork Frankfurters or hot dogs  Sardines Gefilte fish   Sausage Ham (boiled ham, Proscuitto Smoked butt    spiced ham)   Spam      TV Dinners Vegetables Canned vegetables (Regular) Relish Canned mushrooms  Sauerkraut Olives    Tomato juice Pickles  Bakery and Dessert Products Canned puddings  Cream pies Cheesecake   Decorated cakes Cookies  Beverages/Juices Tomato juice, regular  Gatorade   V-8 vegetable  juice, regular  Breads and Cereals Biscuit mixes   Salted potato chips, corn  chips, pretzels Bread stuffing mixes  Salted crackers and rolls Pancake and waffle mixes Self-rising flour  Seasonings Accent    Meat sauces Barbecue sauce  Meat tenderizer Catsup    Monosodium glutamate (MSG) Celery salt   Onion salt Chili sauce   Prepared mustard Garlic salt   Salt, seasoned salt, sea salt Gravy mixes   Soy sauce Horseradish   Steak sauce Ketchup   Tartar sauce Lite salt    Teriyaki sauce Marinade mixes   Worcestershire sauce  Others Baking powder   Cocoa and cocoa mixes Baking soda   Commercial casserole mixes Candy-caramels, chocolate  Dehydrated soups    Bars, fudge,nougats  Instant rice and pasta mixes Canned broth or soup  Maraschino cherries Cheese, aged and processed cheese and cheese spreads  Learning Assessment Quiz  Indicated T (for True) or F (for False) for each of the following statements:  1. _____ Fresh fruits and vegetables and unprocessed grains are generally low in sodium 2. _____ Water may contain a considerable amount of sodium, depending on the source 3. _____ You can always tell if a food is high in sodium by tasting it 4. _____ Certain laxatives my be high in sodium and should be avoided unless prescribed   by a physician or pharmacist 5. _____ Salt substitutes may be used freely by anyone on a sodium restricted diet 6. _____ Sodium is present in table salt, food additives and as a natural component of   most foods 7. _____ Table salt is approximately 90% sodium 8. _____ Limiting sodium intake may help prevent excess fluid accumulation in the body 9. _____ On a sodium-restricted diet, seasonings such as bouillon soy sauce, and    cooking wine should be used in place of table salt 10. _____ On an ingredient list, a product which lists monosodium glutamate as the first   ingredient is an appropriate food to include on a low sodium diet  Circle the best answer(s) to the following statements (Hint: there may be more than one correct  answer)  11. On a low-sodium diet, some acceptable snack items are:    A. Olives  F. Bean dip   K. Grapefruit juice    B. Salted Pretzels G. Commercial Popcorn   L. Canned peaches    C. Carrot Sticks  H. Bouillon   M. Unsalted nuts   D. [place] fries  I. Peanut butter crackers N. Salami   E. Sweet pickles J. Tomato Juice   O. Pizza  12.  Seasonings that may be used freely on a reduced - sodium diet include   A. Lemon wedges F.Monosodium glutamate K. Celery seed    B.Soysauce   G. Pepper   L. Mustard powder   C. Sea salt  H. Cooking wine  M. Onion flakes   D. Vinegar  E. Prepared horseradish N. Salsa   E. Sage   J. Worcestershire sauce  O. Chutney

## 2020-09-27 LAB — LIPID PANEL
Chol/HDL Ratio: 3.2 ratio (ref 0.0–5.0)
Cholesterol, Total: 110 mg/dL (ref 100–199)
HDL: 34 mg/dL — ABNORMAL LOW (ref >39)
LDL Chol Calc (NIH): 44 mg/dL (ref 0–99)
Triglycerides: 197 mg/dL — ABNORMAL HIGH (ref 0–149)
VLDL Cholesterol Cal: 32 mg/dL (ref 5–40)

## 2020-09-27 LAB — HEPATIC FUNCTION PANEL
ALT: 14 IU/L (ref 0–44)
AST: 14 IU/L (ref 0–40)
Albumin: 4.2 g/dL (ref 3.6–4.6)
Alkaline Phosphatase: 133 IU/L — ABNORMAL HIGH (ref 44–121)
Bilirubin Total: 0.3 mg/dL (ref 0.0–1.2)
Bilirubin, Direct: <0.1 mg/dL (ref 0.00–0.40)
Total Protein: 6.8 g/dL (ref 6.0–8.5)

## 2020-09-27 LAB — HEMOGLOBIN A1C
Est. average glucose Bld gHb Est-mCnc: 131 mg/dL
Hgb A1c MFr Bld: 6.2 % — ABNORMAL HIGH (ref 4.8–5.6)

## 2020-11-24 ENCOUNTER — Emergency Department (HOSPITAL_COMMUNITY): Payer: Medicare HMO

## 2020-11-24 ENCOUNTER — Other Ambulatory Visit: Payer: Self-pay

## 2020-11-24 ENCOUNTER — Inpatient Hospital Stay (HOSPITAL_COMMUNITY)
Admission: EM | Admit: 2020-11-24 | Discharge: 2020-11-27 | DRG: 250 | Disposition: A | Discharge status: Home / Self Care | Payer: Medicare HMO | Attending: Interventional Cardiology | Admitting: Interventional Cardiology

## 2020-11-24 ENCOUNTER — Encounter (HOSPITAL_COMMUNITY): Payer: Self-pay | Admitting: Emergency Medicine

## 2020-11-24 DIAGNOSIS — I214 Non-ST elevation (NSTEMI) myocardial infarction: Secondary | ICD-10-CM | POA: Diagnosis present

## 2020-11-24 DIAGNOSIS — I10 Essential (primary) hypertension: Secondary | ICD-10-CM | POA: Diagnosis present

## 2020-11-24 DIAGNOSIS — Z888 Allergy status to other drugs, medicaments and biological substances status: Secondary | ICD-10-CM

## 2020-11-24 DIAGNOSIS — I6523 Occlusion and stenosis of bilateral carotid arteries: Secondary | ICD-10-CM | POA: Diagnosis present

## 2020-11-24 DIAGNOSIS — R001 Bradycardia, unspecified: Secondary | ICD-10-CM | POA: Diagnosis not present

## 2020-11-24 DIAGNOSIS — Y712 Prosthetic and other implants, materials and accessory cardiovascular devices associated with adverse incidents: Secondary | ICD-10-CM | POA: Diagnosis present

## 2020-11-24 DIAGNOSIS — I200 Unstable angina: Secondary | ICD-10-CM

## 2020-11-24 DIAGNOSIS — Z7982 Long term (current) use of aspirin: Secondary | ICD-10-CM

## 2020-11-24 DIAGNOSIS — N1831 Chronic kidney disease, stage 3a: Secondary | ICD-10-CM

## 2020-11-24 DIAGNOSIS — E785 Hyperlipidemia, unspecified: Secondary | ICD-10-CM | POA: Diagnosis not present

## 2020-11-24 DIAGNOSIS — Z87442 Personal history of urinary calculi: Secondary | ICD-10-CM | POA: Diagnosis not present

## 2020-11-24 DIAGNOSIS — I739 Peripheral vascular disease, unspecified: Secondary | ICD-10-CM | POA: Diagnosis present

## 2020-11-24 DIAGNOSIS — R778 Other specified abnormalities of plasma proteins: Secondary | ICD-10-CM

## 2020-11-24 DIAGNOSIS — Z930 Tracheostomy status: Secondary | ICD-10-CM | POA: Diagnosis not present

## 2020-11-24 DIAGNOSIS — Z8673 Personal history of transient ischemic attack (TIA), and cerebral infarction without residual deficits: Secondary | ICD-10-CM | POA: Diagnosis not present

## 2020-11-24 DIAGNOSIS — Z79899 Other long term (current) drug therapy: Secondary | ICD-10-CM

## 2020-11-24 DIAGNOSIS — Z880 Allergy status to penicillin: Secondary | ICD-10-CM

## 2020-11-24 DIAGNOSIS — Z818 Family history of other mental and behavioral disorders: Secondary | ICD-10-CM

## 2020-11-24 DIAGNOSIS — R072 Precordial pain: Secondary | ICD-10-CM

## 2020-11-24 DIAGNOSIS — Z20822 Contact with and (suspected) exposure to covid-19: Secondary | ICD-10-CM | POA: Diagnosis not present

## 2020-11-24 DIAGNOSIS — Z951 Presence of aortocoronary bypass graft: Secondary | ICD-10-CM | POA: Diagnosis not present

## 2020-11-24 DIAGNOSIS — Z87891 Personal history of nicotine dependence: Secondary | ICD-10-CM

## 2020-11-24 DIAGNOSIS — Z923 Personal history of irradiation: Secondary | ICD-10-CM

## 2020-11-24 DIAGNOSIS — K219 Gastro-esophageal reflux disease without esophagitis: Secondary | ICD-10-CM | POA: Diagnosis not present

## 2020-11-24 DIAGNOSIS — T82855A Stenosis of coronary artery stent, initial encounter: Secondary | ICD-10-CM | POA: Diagnosis not present

## 2020-11-24 DIAGNOSIS — I129 Hypertensive chronic kidney disease with stage 1 through stage 4 chronic kidney disease, or unspecified chronic kidney disease: Secondary | ICD-10-CM | POA: Diagnosis present

## 2020-11-24 DIAGNOSIS — I4510 Unspecified right bundle-branch block: Secondary | ICD-10-CM | POA: Diagnosis present

## 2020-11-24 DIAGNOSIS — I25110 Atherosclerotic heart disease of native coronary artery with unstable angina pectoris: Secondary | ICD-10-CM | POA: Diagnosis present

## 2020-11-24 DIAGNOSIS — Z7902 Long term (current) use of antithrombotics/antiplatelets: Secondary | ICD-10-CM

## 2020-11-24 DIAGNOSIS — Z9841 Cataract extraction status, right eye: Secondary | ICD-10-CM

## 2020-11-24 DIAGNOSIS — Z881 Allergy status to other antibiotic agents status: Secondary | ICD-10-CM

## 2020-11-24 DIAGNOSIS — R7303 Prediabetes: Secondary | ICD-10-CM | POA: Diagnosis not present

## 2020-11-24 DIAGNOSIS — Z882 Allergy status to sulfonamides status: Secondary | ICD-10-CM

## 2020-11-24 DIAGNOSIS — Z9842 Cataract extraction status, left eye: Secondary | ICD-10-CM

## 2020-11-24 DIAGNOSIS — Z8521 Personal history of malignant neoplasm of larynx: Secondary | ICD-10-CM

## 2020-11-24 DIAGNOSIS — N183 Chronic kidney disease, stage 3 unspecified: Secondary | ICD-10-CM

## 2020-11-24 DIAGNOSIS — Z83438 Family history of other disorder of lipoprotein metabolism and other lipidemia: Secondary | ICD-10-CM

## 2020-11-24 DIAGNOSIS — E781 Pure hyperglyceridemia: Secondary | ICD-10-CM | POA: Diagnosis present

## 2020-11-24 DIAGNOSIS — R7989 Other specified abnormal findings of blood chemistry: Secondary | ICD-10-CM

## 2020-11-24 DIAGNOSIS — H9190 Unspecified hearing loss, unspecified ear: Secondary | ICD-10-CM | POA: Diagnosis present

## 2020-11-24 DIAGNOSIS — I252 Old myocardial infarction: Secondary | ICD-10-CM | POA: Diagnosis not present

## 2020-11-24 DIAGNOSIS — Z809 Family history of malignant neoplasm, unspecified: Secondary | ICD-10-CM

## 2020-11-24 DIAGNOSIS — I2510 Atherosclerotic heart disease of native coronary artery without angina pectoris: Secondary | ICD-10-CM

## 2020-11-24 DIAGNOSIS — Z947 Corneal transplant status: Secondary | ICD-10-CM

## 2020-11-24 DIAGNOSIS — Z8249 Family history of ischemic heart disease and other diseases of the circulatory system: Secondary | ICD-10-CM

## 2020-11-24 DIAGNOSIS — I358 Other nonrheumatic aortic valve disorders: Secondary | ICD-10-CM | POA: Diagnosis present

## 2020-11-24 DIAGNOSIS — N179 Acute kidney failure, unspecified: Secondary | ICD-10-CM | POA: Diagnosis present

## 2020-11-24 DIAGNOSIS — Z955 Presence of coronary angioplasty implant and graft: Secondary | ICD-10-CM

## 2020-11-24 LAB — HEPARIN LEVEL (UNFRACTIONATED): Heparin Unfractionated: 0.34 IU/mL (ref 0.30–0.70)

## 2020-11-24 LAB — COMPREHENSIVE METABOLIC PANEL
ALT: 14 U/L (ref 0–44)
AST: 15 U/L (ref 15–41)
Albumin: 3.6 g/dL (ref 3.5–5.0)
Alkaline Phosphatase: 110 U/L (ref 38–126)
Anion gap: 8 (ref 5–15)
BUN: 22 mg/dL (ref 8–23)
CO2: 26 mmol/L (ref 22–32)
Calcium: 9 mg/dL (ref 8.9–10.3)
Chloride: 103 mmol/L (ref 98–111)
Creatinine, Ser: 1.63 mg/dL — ABNORMAL HIGH (ref 0.61–1.24)
GFR, Estimated: 42 mL/min — ABNORMAL LOW (ref >60)
Glucose, Bld: 116 mg/dL — ABNORMAL HIGH (ref 70–99)
Potassium: 4.3 mmol/L (ref 3.5–5.1)
Sodium: 137 mmol/L (ref 135–145)
Total Bilirubin: 0.5 mg/dL (ref 0.3–1.2)
Total Protein: 6.7 g/dL (ref 6.5–8.1)

## 2020-11-24 LAB — CBC WITH DIFFERENTIAL/PLATELET
Abs Immature Granulocytes: 0.05 10*3/uL (ref 0.00–0.07)
Basophils Absolute: 0 10*3/uL (ref 0.0–0.1)
Basophils Relative: 0 %
Eosinophils Absolute: 0.2 10*3/uL (ref 0.0–0.5)
Eosinophils Relative: 2 %
HCT: 40.7 % (ref 39.0–52.0)
Hemoglobin: 13.2 g/dL (ref 13.0–17.0)
Immature Granulocytes: 1 %
Lymphocytes Relative: 16 %
Lymphs Abs: 1.5 10*3/uL (ref 0.7–4.0)
MCH: 31.1 pg (ref 26.0–34.0)
MCHC: 32.4 g/dL (ref 30.0–36.0)
MCV: 95.8 fL (ref 80.0–100.0)
Monocytes Absolute: 0.8 10*3/uL (ref 0.1–1.0)
Monocytes Relative: 8 %
Neutro Abs: 6.8 10*3/uL (ref 1.7–7.7)
Neutrophils Relative %: 73 %
Platelets: 245 10*3/uL (ref 150–400)
RBC: 4.25 MIL/uL (ref 4.22–5.81)
RDW: 14.3 % (ref 11.5–15.5)
WBC: 9.4 10*3/uL (ref 4.0–10.5)
nRBC: 0 % (ref 0.0–0.2)

## 2020-11-24 LAB — RESP PANEL BY RT-PCR (FLU A&B, COVID) ARPGX2
Influenza A by PCR: NEGATIVE
Influenza B by PCR: NEGATIVE
SARS Coronavirus 2 by RT PCR: NEGATIVE

## 2020-11-24 LAB — TROPONIN I (HIGH SENSITIVITY)
Troponin I (High Sensitivity): 66 ng/L — ABNORMAL HIGH (ref <18)
Troponin I (High Sensitivity): 93 ng/L — ABNORMAL HIGH (ref <18)
Troponin I (High Sensitivity): 131 ng/L (ref <18)
Troponin I (High Sensitivity): 162 ng/L (ref <18)

## 2020-11-24 MED ORDER — ONDANSETRON HCL 4 MG/2ML IJ SOLN: 4.0000 mg | Freq: Four times a day (QID) | INTRAMUSCULAR | Status: DC | PRN

## 2020-11-24 MED ORDER — ASPIRIN 300 MG RE SUPP: 300.0000 mg | RECTAL | Status: AC

## 2020-11-24 MED ORDER — ASPIRIN EC 81 MG PO TBEC
81.0000 mg | DELAYED_RELEASE_TABLET | Freq: Every day | ORAL | Status: DC
  Administered 2020-11-26: 81 mg via ORAL
  Filled 2020-11-24 (×2): qty 1

## 2020-11-24 MED ORDER — NITROGLYCERIN 0.4 MG SL SUBL: 0.4000 mg | SUBLINGUAL_TABLET | SUBLINGUAL | Status: DC | PRN

## 2020-11-24 MED ORDER — METOPROLOL TARTRATE 25 MG PO TABS
25.0000 mg | ORAL_TABLET | Freq: Two times a day (BID) | ORAL | Status: DC
  Administered 2020-11-24 – 2020-11-26 (×4): 25 mg via ORAL
  Filled 2020-11-24 (×5): qty 1

## 2020-11-24 MED ORDER — ACETAMINOPHEN 325 MG PO TABS: 650.0000 mg | ORAL_TABLET | ORAL | Status: DC | PRN

## 2020-11-24 MED ORDER — SODIUM CHLORIDE 0.9 % IV SOLN: 250.0000 mL | INTRAVENOUS | Status: DC | PRN

## 2020-11-24 MED ORDER — HEPARIN (PORCINE) 25000 UT/250ML-% IV SOLN: 14.0000 [IU]/kg/h | INTRAVENOUS | Status: DC

## 2020-11-24 MED ORDER — ASPIRIN 81 MG PO CHEW
81.0000 mg | CHEWABLE_TABLET | ORAL | Status: AC
  Administered 2020-11-25: 81 mg via ORAL
  Filled 2020-11-24: qty 1

## 2020-11-24 MED ORDER — CLOPIDOGREL BISULFATE 75 MG PO TABS
75.0000 mg | ORAL_TABLET | Freq: Every day | ORAL | Status: DC
  Administered 2020-11-24 – 2020-11-26 (×3): 75 mg via ORAL
  Filled 2020-11-24 (×4): qty 1

## 2020-11-24 MED ORDER — ASPIRIN EC 81 MG PO TBEC
81.0000 mg | DELAYED_RELEASE_TABLET | Freq: Every day | ORAL | Status: DC
Start: 2020-11-25 — End: 2020-11-24

## 2020-11-24 MED ORDER — SODIUM CHLORIDE 0.9% FLUSH
3.0000 mL | Freq: Two times a day (BID) | INTRAVENOUS | Status: DC
  Administered 2020-11-24: 3 mL via INTRAVENOUS

## 2020-11-24 MED ORDER — SODIUM CHLORIDE 0.9% FLUSH: 3.0000 mL | INTRAVENOUS | Status: DC | PRN

## 2020-11-24 MED ORDER — AMLODIPINE BESYLATE 10 MG PO TABS
10.0000 mg | ORAL_TABLET | Freq: Every day | ORAL | Status: DC
  Administered 2020-11-24 – 2020-11-27 (×4): 10 mg via ORAL
  Filled 2020-11-24 (×4): qty 1

## 2020-11-24 MED ORDER — NITROGLYCERIN 0.4 MG SL SUBL
0.4000 mg | SUBLINGUAL_TABLET | SUBLINGUAL | Status: DC | PRN
  Administered 2020-11-24: 0.4 mg via SUBLINGUAL
  Filled 2020-11-24: qty 1

## 2020-11-24 MED ORDER — SPIRONOLACTONE 25 MG PO TABS
25.0000 mg | ORAL_TABLET | Freq: Every day | ORAL | Status: DC
  Administered 2020-11-24: 25 mg via ORAL
  Filled 2020-11-24: qty 1

## 2020-11-24 MED ORDER — ASPIRIN EC 81 MG PO TBEC: 81.0000 mg | DELAYED_RELEASE_TABLET | Freq: Every day | ORAL | Status: DC

## 2020-11-24 MED ORDER — HEPARIN SODIUM (PORCINE) 5000 UNIT/ML IJ SOLN
4000.0000 [IU] | Freq: Once | INTRAMUSCULAR | Status: AC
  Administered 2020-11-24: 4000 [IU] via INTRAVENOUS
  Filled 2020-11-24: qty 1

## 2020-11-24 MED ORDER — ALBUTEROL SULFATE (2.5 MG/3ML) 0.083% IN NEBU
2.5000 mg | INHALATION_SOLUTION | Freq: Three times a day (TID) | RESPIRATORY_TRACT | Status: DC
  Administered 2020-11-24 – 2020-11-25 (×2): 2.5 mg via RESPIRATORY_TRACT
  Filled 2020-11-24 (×2): qty 3

## 2020-11-24 MED ORDER — DIPHENHYDRAMINE HCL 25 MG PO CAPS
25.0000 mg | ORAL_CAPSULE | Freq: Every evening | ORAL | Status: DC | PRN
  Administered 2020-11-24 – 2020-11-25 (×2): 25 mg via ORAL
  Filled 2020-11-24 (×2): qty 1

## 2020-11-24 MED ORDER — EZETIMIBE 10 MG PO TABS
10.0000 mg | ORAL_TABLET | Freq: Every day | ORAL | Status: DC
  Administered 2020-11-24 – 2020-11-27 (×4): 10 mg via ORAL
  Filled 2020-11-24 (×4): qty 1

## 2020-11-24 MED ORDER — HEPARIN (PORCINE) 25000 UT/250ML-% IV SOLN
1200.0000 [IU]/h | INTRAVENOUS | Status: DC
  Administered 2020-11-24: 1000 [IU]/h via INTRAVENOUS
  Administered 2020-11-25: 1100 [IU]/h via INTRAVENOUS
  Filled 2020-11-24 (×2): qty 250

## 2020-11-24 MED ORDER — PANTOPRAZOLE SODIUM 40 MG PO TBEC
40.0000 mg | DELAYED_RELEASE_TABLET | Freq: Every day | ORAL | Status: DC
  Administered 2020-11-24 – 2020-11-27 (×4): 40 mg via ORAL
  Filled 2020-11-24 (×4): qty 1

## 2020-11-24 MED ORDER — ACETAMINOPHEN 500 MG PO TABS: 500.0000 mg | ORAL_TABLET | Freq: Four times a day (QID) | ORAL | Status: DC | PRN

## 2020-11-24 MED ORDER — ASPIRIN 81 MG PO CHEW
324.0000 mg | CHEWABLE_TABLET | ORAL | Status: AC
  Administered 2020-11-24: 324 mg via ORAL
  Filled 2020-11-24: qty 4

## 2020-11-24 MED ORDER — PAROXETINE HCL 20 MG PO TABS
20.0000 mg | ORAL_TABLET | Freq: Every day | ORAL | Status: DC
  Administered 2020-11-24 – 2020-11-26 (×3): 20 mg via ORAL
  Filled 2020-11-24 (×3): qty 1

## 2020-11-24 MED ORDER — SODIUM CHLORIDE 0.9 % IV SOLN: INTRAVENOUS | Status: DC

## 2020-11-24 NOTE — ED Notes (Signed)
Date and time results received: 11/24/20 1110   Test: Trop Critical Value: 29  Name of Provider Notified: Rogene Houston

## 2020-11-24 NOTE — Progress Notes (Incomplete)
Patient seen and discussed with PA Bonnell Public, I agree with her documentation. 80 yo male extensive history of CAD with CABG in 2003. Inferior STEMI 02/2020 at [facility], received DES to mid RCA.  Admit 05/2020 with NSTEMI, received additional stent to RCA. Presents with chest pain   WBC 9.4 Hgb 13.2 Plt 245 K 4.3 Cr 1.63  Trop 66-->93-->131-->162 CXR no acute process EKG SR, inferior Qwaves, anterior TWIs in setting of RBBB   Patient with extensive CAD history including prior CABG, STEMI in 02/2020 and NSTEMI 05/2020 both with interventions to RCA. Presents with chest pain. Fairly mild trop but significant positive delta in setting of chest pains supportive of early NSTEMI. Medical therapy with ASA, plavix, lopressor. Hold his ARB given renal function and cath plans. He is intolerant to statins on praluent at home. Start hep gtt, plan for transfer to [facility] and heart catheterization tomorrow.   Carlyle Dolly MD

## 2020-11-24 NOTE — ED Notes (Signed)
Patient continues to complain of chest pain 5/10. Refuses anymore Nitro.

## 2020-11-24 NOTE — Progress Notes (Signed)
ANTICOAGULATION CONSULT NOTE - Initial Consult  Pharmacy Consult for heparin Indication: chest pain/ACS  Allergies  Allergen Reactions  . Nifedipine Other (See Comments)  . Lorazepam Other (See Comments)    REACTION: Alters mental status. "Turns into maniac" REACTION: Alters mental status. "Turns into maniac" REACTION: Alters mental status. "Turns into maniac"  . Other     REACTION: Unknown to patient. States that MD states allergy per medical records  . Penicillins Hives    Has patient had a PCN reaction causing immediate rash, facial/tongue/throat swelling, SOB or lightheadedness with hypotension: Yes Has patient had a PCN reaction causing severe rash involving mucus membranes or skin necrosis: No Has patient had a PCN reaction that required hospitalization No Has patient had a PCN reaction occurring within the last 10 years: No If all of the above answers are "NO", then may proceed with Cephalosporin use.  HAS TOLERATED: cephalexin  . Sulfacetamide     REACTION: Unknown to patient. States that MD states allergy per medical records  . Sulfonamide Derivatives Other (See Comments)    REACTION: Unknown to patient. States that MD states allergy per medical records  . Erythromycin Rash  . Statins Itching and Rash    Rash on his trunk and arms, swelling in his lips  . Vancomycin Itching    Patient Measurements: Height: 5\' 6"  (167.6 cm) Weight: 82.6 kg (182 lb) IBW/kg (Calculated) : 63.8 Heparin Dosing Weight: 80.6  Vital Signs: Temp: 97.8 F (36.6 C) (05/02 1353) Temp Source: Oral (05/02 1353) BP: 116/68 (05/02 1353) Pulse Rate: 55 (05/02 1353)  Labs: Recent Labs    11/24/20 0820 11/24/20 1021 11/24/20 1230  HGB 13.2  --   --   HCT 40.7  --   --   PLT 245  --   --   CREATININE 1.63*  --   --   TROPONINIHS 66* 93* 131*    Estimated Creatinine Clearance: 35.8 mL/min (A) (by C-G formula based on SCr of 1.63 mg/dL (H)).   Medical History: Past Medical History:   Diagnosis Date  . Acute ST elevation myocardial infarction (STEMI) of inferior wall [facility]) 03/01/2020   [facility]  . Anxiety   . 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 ASCVD (arteriosclerotic cardiovascular disease)   . Chronic lower back pain    "have it at night" (05/04/2018)  . Coronary artery disease    a. 2003: s/p CABG x3V  b. 2007: cath with patent bypass grafts.  c. 09/2016: [place] with cath showing patent bypass grafts, medical Rx recommended  . CVA (cerebral vascular accident) ([place]) 10/2017   "little numb on my left face since" (05/04/2018)  . Depression   . GERD (gastroesophageal reflux disease)   . High triglycerides   . History of kidney stones   . Hypertension   . 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 Malodorous urine    "in the last 5wks; since I had trach put in" (05/04/2018)  . Peripheral vascular disease ([place])   . Stroke [facility])    "he's had several little strokes; many that he wasn't aware of" (05/04/2018)  . Tobacco abuse   . Tracheal stenosis     Medications:  (Not in a hospital admission)   Assessment: Pharmacy consulted to dose heparin in patient with chest pain/ACS.  Patient is not on anticoagulation prior to admission.  Patient with history of CABG and NSTEMI.  Goal of Therapy:  Heparin level 0.3-0.7 units/ml Monitor platelets by anticoagulation protocol: Yes  Plan:  Give 4000 units bolus x 1 Start heparin infusion at 1000 units/hr Check anti-Xa level in 8 hours and daily while on heparin Continue to monitor H&H and platelets  Ramond Craver 11/24/2020,2:21 PM

## 2020-11-24 NOTE — ED Notes (Signed)
Pt in bed, pt denies chest pain at this time.

## 2020-11-24 NOTE — Progress Notes (Addendum)
[place] for heparin Indication: chest pain/ACS  Labs: Recent Labs    11/24/20 0820 11/24/20 1021 11/24/20 1230 11/24/20 1409  HGB 13.2  --   --   --   HCT 40.7  --   --   --   PLT 245  --   --   --   CREATININE 1.63*  --   --   --   TROPONINIHS 66* 93* 131* 162*   Assessment: Pharmacy consulted to dose heparin in patient with chest pain/ACS.  Patient is not on anticoagulation prior to admission.  Patient with history of CABG and NSTEMI. Initial heparin level 0.34 units/ml  Goal of Therapy:  Heparin level 0.3-0.7 units/ml Monitor platelets by anticoagulation protocol: Yes   Plan:  Continue heparin at 1000 units/hr F/u daily HL and CBC  Kenneth [name] 11/24/2020,11:25 PM  Addum:  Heparin level 0.25 units/ml.  Will increase heparin to 1100 units/hr

## 2020-11-24 NOTE — H&P (Addendum)
Cardiology Admission History and Physical:   Patient ID: Kenneth [name] MRN: [id number]; DOB: January 10, 1941   Admission date: 11/24/2020  PCP:  [name], MD   [place]  Cardiologist:  Carlyle Dolly, MD  Advanced Practice Provider:  No care team member to display Electrophysiologist:  None   :[id number]}    Chief Complaint: Chest pain  Patient Profile:   Kenneth [name] is a 80 y.o. male with  history of CAD status post CABG 2003 STEMI treated with DES to the RCA 02/2020, NSTEMI 08/06/2019 with DES to the RCA, previously placed mid RCA stent and LIMA to the diagonal graft was patent, RIMA to the LAD was poorly visualized.  Echo showed LVEF 40 to 45% being seen today for recurrent chest pain at the request of Dr. Rogene Houston History of Present Illness:   Kenneth [name] with  history of CAD status post CABG 2003 STEMI treated with DES to the RCA 02/2020, NSTEMI 06/05/2020 with DES to the RCA, previously placed mid RCA stent and LIMA to the diagonal graft was patent, RIMA to the LAD was poorly visualized.  Echo showed LVEF 40 to 45% with basilar to mid inferior akinesis inferior hypokinesis and inferoseptal hypokinesis grade 2 DD.  Losartan held in the past due to CKD, bilateral carotid stenosis, HLD, hypertension, history of TIA, tracheostomy.  Limited echo 06/05/2020 normal LVEF 55%.  Patient comes in with recurrent chest pain.  Chest pressure and heaviness started yesterday and he thought it was GERD. He took multiple GI meds that didn't help. No radiation of pain. Relieved with NTG in ED. Still mild residual heaviness. Troponins trending up 66, 93, 131.  EKG some ST elevation in 3 and aVR    Past Medical History:  Diagnosis Date  . Acute ST elevation myocardial infarction (STEMI) of inferior wall [facility]) 03/01/2020   [facility]  . Anxiety   . 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 ASCVD (arteriosclerotic cardiovascular disease)   . Chronic lower  back pain    "have it at night" (05/04/2018)  . Coronary artery disease    a. 2003: s/p CABG x3V  b. 2007: cath with patent bypass grafts.  c. 09/2016: [place] with cath showing patent bypass grafts, medical Rx recommended  . CVA (cerebral vascular accident) ([place]) 10/2017   "little numb on my left face since" (05/04/2018)  . Depression   . GERD (gastroesophageal reflux disease)   . High triglycerides   . History of kidney stones   . Hypertension   . 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 Malodorous urine    "in the last 5wks; since I had trach put in" (05/04/2018)  . Peripheral vascular disease ([place])   . Stroke [facility])    "he's had several little strokes; many that he wasn't aware of" (05/04/2018)  . Tobacco abuse   . Tracheal stenosis     Past Surgical History:  Procedure Laterality Date  . CAROTID STENT Right 06-13-12; 05/04/2018  . CAROTID STENT INSERTION N/A 06/13/2012   Procedure: CAROTID STENT INSERTION;  Surgeon: Serafina Mitchell, MD;  Location: [facility] CATH LAB;  Service: Cardiovascular;  Laterality: N/A;  . CATARACT EXTRACTION, BILATERAL Bilateral   . COLONOSCOPY  11/10/2011   Dr. [name]: hemorrhoids, tubular adenoma  . CORNEAL TRANSPLANT Bilateral   . CORONARY ARTERY BYPASS GRAFT  ~ 2003   "CABG X3"  . CORONARY STENT INTERVENTION N/A 06/05/2020   Procedure: CORONARY STENT INTERVENTION;  Surgeon: Lorretta Harp,  MD;  Location: [place] CV LAB;  Service: Cardiovascular;  Laterality: N/A;  . EYE SURGERY    . INSERTION OF RETROGRADE CAROTID STENT Right 05/04/2018   Procedure: INSERTION OF RIGHT  CAROTID STENT using an ABBOT- XACT carotid stent system;  Surgeon: Serafina Mitchell, MD;  Location: [place];  Service: Vascular;  Laterality: Right;  . LAPAROSCOPIC CHOLECYSTECTOMY  2007  . LEFT HEART CATH AND CORS/GRAFTS ANGIOGRAPHY N/A 10/07/2016   Procedure: Left Heart Cath and Cors/Grafts Angiography;  Surgeon: Troy Sine, MD;  Location: [place] CV LAB;  Service: Cardiovascular;   Laterality: N/A;  . LEFT HEART CATH AND CORS/GRAFTS ANGIOGRAPHY N/A 06/05/2020   Procedure: LEFT HEART CATH AND CORS/GRAFTS ANGIOGRAPHY;  Surgeon: Lorretta Harp, MD;  Location: [place] CV LAB;  Service: Cardiovascular;  Laterality: N/A;  . PARTIAL LARYNGECTOMY  1998  . TRACHEOSTOMY  03/13/2018   "@ [facility]"     Medications Prior to Admission: Prior to Admission medications   Medication Sig Start Date End Date Taking? Authorizing Provider  acetaminophen (TYLENOL) 500 MG tablet Take 500 mg by mouth every 6 (six) hours as needed.   Yes [provider]  albuterol (PROVENTIL) (2.5 MG/3ML) 0.083% nebulizer solution Take 3 mLs (2.5 mg total) by nebulization 3 (three) times daily. X 5 days and then q 6 hrs prn after that Patient taking differently: Take 2.5 mg by nebulization 3 (three) times daily. X 5 days and then every 6 hrs as needed after that 09/29/18  Yes Emokpae, Courage, MD  Alirocumab (PRALUENT) 75 MG/ML SOAJ Inject 1 pen into the skin every 14 (fourteen) days. 07/04/20  Yes Arnoldo Lenis, MD  amLODipine (NORVASC) 10 MG tablet Take 1 tablet by mouth once daily 05/05/20  Yes Calan Doren, Alphonse Guild, MD  aspirin EC 81 MG tablet Take 81 mg by mouth daily. Swallow whole.   Yes [provider]  clopidogrel (PLAVIX) 75 MG tablet Take 1 tablet by mouth once daily 02/13/20  Yes Sohail Capraro, Alphonse Guild, MD  doxylamine, Sleep, (UNISOM) 25 MG tablet Take 25 mg by mouth at bedtime as needed.   Yes [provider]  ezetimibe (ZETIA) 10 MG tablet Take 1 tablet by mouth once daily 05/26/20  Yes Jaxxson Cavanah, Alphonse Guild, MD  losartan (COZAAR) 50 MG tablet Take 50 mg by mouth daily.   Yes [provider]  metoprolol tartrate (LOPRESSOR) 25 MG tablet Take 25 mg by mouth 2 (two) times daily.  03/16/17  Yes [provider]  nitroGLYCERIN (NITROSTAT) 0.4 MG SL tablet Place 1 tablet (0.4 mg total) under the tongue every 5 (five) minutes as needed for chest pain. 01/31/17 05/25/21  Yes Lendon Colonel, NP  pantoprazole (PROTONIX) 40 MG tablet Take 1 tablet (40 mg total) by mouth daily. 06/07/20  Yes Kathyrn Drown D, NP  PARoxetine (PAXIL) 20 MG tablet Take 20 mg by mouth at bedtime.   Yes [provider]  spironolactone (ALDACTONE) 25 MG tablet Take 25 mg by mouth daily. 08/29/20  Yes [provider]     Allergies:    Allergies  Allergen Reactions  . Nifedipine Other (See Comments)  . Lorazepam Other (See Comments)    REACTION: Alters mental status. "Turns into maniac" REACTION: Alters mental status. "Turns into maniac" REACTION: Alters mental status. "Turns into maniac"  . Other     REACTION: Unknown to patient. States that MD states allergy per medical records  . Penicillins Hives    Has patient had a PCN  reaction causing immediate rash, facial/tongue/throat swelling, SOB or lightheadedness with hypotension: Yes Has patient had a PCN reaction causing severe rash involving mucus membranes or skin necrosis: No Has patient had a PCN reaction that required hospitalization No Has patient had a PCN reaction occurring within the last 10 years: No If all of the above answers are "NO", then may proceed with Cephalosporin use.  HAS TOLERATED: cephalexin  . Sulfacetamide     REACTION: Unknown to patient. States that MD states allergy per medical records  . Sulfonamide Derivatives Other (See Comments)    REACTION: Unknown to patient. States that MD states allergy per medical records  . Erythromycin Rash  . Statins Itching and Rash    Rash on his trunk and arms, swelling in his lips  . Vancomycin Itching    Social History:   Social History   Socioeconomic History  . Marital status: Married    Spouse name: Not on file  . Number of children: Not on file  . Years of education: Not on file  . Highest education level: Not on file  Occupational History  . Occupation: retired    Comment: Warden/ranger  Tobacco Use  . Smoking status: Former  Smoker    Packs/day: 1.00    Years: 15.00    Pack years: 15.00    Types: Cigarettes    Start date: 04/22/1953    Quit date: 07/26/1970    Years since quitting: 50.3  . Smokeless tobacco: Former Systems developer    Types: Chew    Quit date: 05/05/2002  Vaping Use  . Vaping Use: Never used  Substance and Sexual Activity  . Alcohol use: Never    Alcohol/week: 0.0 standard drinks  . Drug use: Never  . Sexual activity: Not Currently  Other Topics Concern  . Not on file  Social History Narrative   Walks on the treadmill every other day at the [facility] for the last couple of weeks.         Social Determinants of Health   Financial Resource Strain: Not on file  Food Insecurity: Not on file  Transportation Needs: Not on file  Physical Activity: Not on file  Stress: Not on file  Social Connections: Not on file  Intimate Partner Violence: Not on file    Family History:    The patient's family history includes Cancer in his brother and brother; Dementia in his mother; Heart disease in his brother and father; Hyperlipidemia in his brother. There is no history of Colon cancer.    ROS:  Please see the history of present illness.  Review of Systems  Constitutional: Negative.  HENT: Positive for hearing loss.        Tracheostomy  Cardiovascular: Positive for chest pain.  Respiratory: Negative.   Endocrine: Negative.   Hematologic/Lymphatic: Negative.   Musculoskeletal: Negative.   Gastrointestinal: Positive for heartburn.  Genitourinary: Negative.   Neurological: Negative.    All other ROS reviewed and negative.     Physical Exam/Data:   Vitals:   11/24/20 1015 11/24/20 1030 11/24/20 1134 11/24/20 1353  BP: 130/74 (!) 141/67 (!) 160/70 116/68  Pulse: (!) 53 (!) 54 64 (!) 55  Resp: 19 16 17 16   Temp:   98.1 F (36.7 C) 97.8 F (36.6 C)  TempSrc:   Oral Oral  SpO2: 95% 98% 96% 97%  Weight:      Height:       No intake or output data in the 24 hours ending 11/24/20 1435  Last 3 Weights  11/24/2020 09/16/2020 06/13/2020  Weight (lbs) 182 lb 182 lb 180 lb  Weight (kg) 82.555 kg 82.555 kg 81.647 kg     Body mass index is 29.38 kg/m.  General:  Well nourished, well developed, in no acute distress  HEENT: normal Lymph: no adenopathy Neck: no  JVD, trach Endocrine:  No thryomegaly Vascular: No carotid bruits; FA pulses 2+ bilaterally without bruits  Cardiac:  normal S1, S2; RRR; no murmur   Lungs:  clear to auscultation bilaterally, no wheezing, rhonchi or rales  Abd: soft, nontender, no hepatomegaly  Ext: no edema Musculoskeletal:  No deformities, BUE and BLE strength normal and equal Skin: warm and dry  Neuro:  CNs 2-12 intact, no focal abnormalities noted Psych:  Normal affect    EKG:  The ECG that was done  was personally reviewed and demonstrates normal sinus rhythm with PACs, RBBB, inferior Q waves, slight ST elevation lead III and aVR  Relevant CV Studies: Catheterization 11/11/2021Left heart cath 05/2020:    Ost LM to Mid LM lesion is 95% stenosed.  Ost Cx to Prox Cx lesion is 99% stenosed.  Prox LAD lesion is 90% stenosed.  Previously placed Prox RCA to Mid RCA stent (unknown type) is widely patent.  Ost RCA to Prox RCA lesion is 95% stenosed.  RPDA-1 lesion is 70% stenosed.  RPDA-2 lesion is 70% stenosed.     Echocardiogram 06/05/20:   1. Left ventricular ejection fraction, by estimation, is 40 to 45%. The  left ventricle has mildly decreased function. The left ventricle  demonstrates regional wall motion abnormalities with basal to mid inferior  akinesis, inferolateral hypokinesis, and  inferoseptal hypokinesis. Left ventricular diastolic parameters are  consistent with Grade II diastolic dysfunction (pseudonormalization).   2. Right ventricular systolic function is moderately reduced. The right  ventricular size is mildly enlarged. There is normal pulmonary artery  systolic pressure. The estimated right ventricular systolic pressure is  123456  mmHg.   3. The mitral valve is normal in structure. Trivial mitral valve  regurgitation. No evidence of mitral stenosis.   4. The aortic valve is tricuspid. Aortic valve regurgitation is not  visualized. Mild to moderate aortic valve sclerosis/calcification is  present, without any evidence of aortic stenosis.   5. The inferior vena cava is normal in size with <50% respiratory  variability, suggesting right atrial pressure of 8 mmHg.  IMPRESSION: Successful proximal RCA PCI drug-eluting stenting in the setting of non-STEMI.  The previously placed stent in the mid RCA was widely patent.  This was placed in [place] in August of this year.  The sequential LIMA to the diagonal and LAD was patent.  The RIMA was not well visualized.  His LVEDP was 21.  The sheath will be removed once ACT falls below 170 and pressure held.  Patient be hydrated overnight, discharged home in the morning.  He left the lab in stable condition.   Quay Burow. MD, [facility] 06/05/2020  Limited echo 06/05/2020 IMPRESSIONS     1. Left ventricular ejection fraction, by estimation, is 55%. The left  ventricle demonstrates regional wall motion abnormalities with basal  inferior and basal inferoseptal akinesis. Left ventricular diastolic  parameters are consistent with Grade II  diastolic dysfunction (pseudonormalization). LV systolic function is  somewhat improved compared to echo yesterday.   2. Right ventricular systolic function is mildly reduced. The right  ventricular size is mildly enlarged.   3. Left atrial size was mildly dilated.   4. The  mitral valve is normal in structure. Trivial mitral valve  regurgitation. No evidence of mitral stenosis.   5. The aortic valve is tricuspid. Aortic valve regurgitation is not  visualized. Mild aortic valve sclerosis is present, with no evidence of  aortic valve stenosis.   6. Limited echo   Laboratory Data:  High Sensitivity Troponin:   Recent Labs  Lab  11/24/20 0820 11/24/20 1021 11/24/20 1230  TROPONINIHS 66* 93* 131*      Chemistry Recent Labs  Lab 11/24/20 0820  NA 137  K 4.3  CL 103  CO2 26  GLUCOSE 116*  BUN 22  CREATININE 1.63*  CALCIUM 9.0  GFRNONAA 42*  ANIONGAP 8    Recent Labs  Lab 11/24/20 0820  PROT 6.7  ALBUMIN 3.6  AST 15  ALT 14  ALKPHOS 110  BILITOT 0.5   Hematology Recent Labs  Lab 11/24/20 0820  WBC 9.4  RBC 4.25  HGB 13.2  HCT 40.7  MCV 95.8  MCH 31.1  MCHC 32.4  RDW 14.3  PLT 245   BNPNo results for input(s): BNP, PROBNP in the last 168 hours.  DDimer No results for input(s): DDIMER in the last 168 hours.   Radiology/Studies:  DG Chest Port 1 View  Result Date: 11/24/2020 CLINICAL DATA:  Chest pain. EXAM: PORTABLE CHEST 1 VIEW COMPARISON:  June 04, 2020 FINDINGS: Tracheostomy catheter tip is 5.0 cm above the carina. Areas of scarring noted in each mid lung and right lower lung region. No edema or airspace opacity. Heart is upper normal in size with pulmonary vascularity normal. Patient is status post internal mammary bypass grafting. No adenopathy. No bone lesions. There is a stent in the right carotid artery region. IMPRESSION: Tracheostomy as described without pneumothorax. Scarring bilaterally, stable, without edema or airspace opacity. Stable cardiac silhouette. Postoperative changes noted. Electronically Signed   By: Lowella Grip III M.D.   On: 11/24/2020 08:47     Assessment and Plan:   NSTEMI with chest pain with positive troponins and slight ST elevation on EKG in 3 and aVR. Patient will mild residual chest pain but overall improved. Plan to transfer to [facility] and cath tomorrow. Will hold losartan and give gentle fluids with Crt 1.63. I have reviewed the risks, indications, and alternatives to angioplasty and stenting with the patient. Risks include but are not limited to bleeding, infection, vascular injury, stroke, myocardial infection, arrhythmia, kidney injury,  radiation-related injury in the case of prolonged fluoroscopy use, emergency cardiac surgery, and death. The patient understands the risks of serious complication is low (<3%) and patient agrees to proceed.     CAD status post CABG 2003 STEMI treated with DES to the RCA 02/2020, NSTEMI 06/05/2020 with DES to the RCA, previously placed mid RCA stent and LIMA to the diagonal graft was patent, RIMA to the LAD was poorly visualized.  Echo showed LVEF 40 to 45% with basilar to mid inferior akinesis inferior hypokinesis and inferoseptal hypokinesis grade 2 DD.Limited echo 06/05/2020 normal LVEF 55%. On IV heparin, plavix, asa, amlodipine, losartan, metoprolol, spironolactone  HTN controlled  CKD Crt 1.63-hold losartan  HLD on praluent and zetia  History of TIA  History of vocal cord CA S/P Tracheostomy   Risk Assessment/Risk Scores:     TIMI Risk Score for Unstable Angina or Non-ST Elevation MI:   The patient's TIMI risk score is  , which indicates a  % risk of all cause mortality, new or recurrent myocardial infarction or need for urgent revascularization  in the next 14 days.       Severity of Illness: The appropriate patient status for this patient is OBSERVATION. Observation status is judged to be reasonable and necessary in order to provide the required intensity of service to ensure the patient's safety. The patient's presenting symptoms, physical exam findings, and initial radiographic and laboratory data in the context of their medical condition is felt to place them at decreased risk for further clinical deterioration. Furthermore, it is anticipated that the patient will be medically stable for discharge from the hospital within 2 midnights of admission. The following factors support the patient status of observation.   " The patient's presenting symptoms include chest pain. " The physical exam findings include trach. " The initial radiographic and laboratory data are positive troponin  and slight ST elevation 3 avr.     For questions or updates, please contact [place] Please consult [email or web address] for contact info under     Signed, Ermalinda Barrios, PA-C  11/24/2020 2:35 PM   Patient seen and discussed with PA Bonnell Public, I agree with her documentation. 80 yo male extensive history of CAD with CABG in 2003. Inferior STEMI 02/2020 at [facility], received DES to mid RCA.  Admit 05/2020 with NSTEMI, received additional stent to RCA. Presents with chest pain   WBC 9.4 Hgb 13.2 Plt 245 K 4.3 Cr 1.63  Trop 66-->93-->131-->162 CXR no acute process EKG SR, inferior Qwaves, anterior TWIs in setting of RBBB   Patient with extensive CAD history including prior CABG, STEMI in 02/2020 and NSTEMI 05/2020 both with interventions to RCA. Presents with chest pain. Fairly mild trop but significant positive delta in setting of chest pains supportive of early NSTEMI. Medical therapy with ASA, plavix, lopressor. Hold his ARB given renal function and cath plans. He is intolerant to statins on praluent at home. Start hep gtt, plan for transfer to [facility] and heart catheterization tomorrow. Gentle IVFs overnight.   I have reviewed the risks, indications, and alternatives to cardiac catheterization, possible angioplasty, and stenting with the patient and his wife today. Risks include but are not limited to bleeding, infection, vascular injury, stroke, myocardial infection, arrhythmia, kidney injury, radiation-related injury in the case of prolonged fluoroscopy use, emergency cardiac surgery, and death. The patient understands the risks of serious complication is 1-2 in 1194 with diagnostic cardiac cath and 1-2% or less with angioplasty/stenting.    Carlyle Dolly MD

## 2020-11-24 NOTE — ED Notes (Signed)
Called [facility] for transfer to [facility]

## 2020-11-24 NOTE — ED Triage Notes (Signed)
Pt c/o of central cp since yesterday.

## 2020-11-24 NOTE — ED Provider Notes (Signed)
[facility] EMERGENCY DEPARTMENT Provider Note   CSN: [id number] Arrival date & time: 11/24/20  0740     History Chief Complaint  Patient presents with  . Chest Pain    Kenneth [name] is a 80 y.o. male.  Patient followed by cardiology here.  Last seen by cardiology February 22.  Patient with significant coronary artery disease CABG and angioplasty in the past.  Patient also had a non-STEMI in November 2021.  Patient with onset of chest pain and discomfort at noon yesterday.  It has been constant since it wax and wane sometimes just discomfort sometimes pain.  Describes the discomfort more as a tightness.  Substernal area.  Nonradiating.  Not associated with nausea vomiting no association with shortness of breath.  Patient did not take any nitroglycerin at home for this.  Patient's had a prior tracheostomy.  Has trach tube in place.  No change in his baseline of shortness of breath I guess is a better way to put it.        Past Medical History:  Diagnosis Date  . Acute ST elevation myocardial infarction (STEMI) of inferior wall [facility]) 03/01/2020   [facility]  . Anxiety   . 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 ASCVD (arteriosclerotic cardiovascular disease)   . Chronic lower back pain    "have it at night" (05/04/2018)  . Coronary artery disease    a. 2003: s/p CABG x3V  b. 2007: cath with patent bypass grafts.  c. 09/2016: [place] with cath showing patent bypass grafts, medical Rx recommended  . CVA (cerebral vascular accident) ([place]) 10/2017   "little numb on my left face since" (05/04/2018)  . Depression   . GERD (gastroesophageal reflux disease)   . High triglycerides   . History of kidney stones   . Hypertension   . 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 Malodorous urine    "in the last 5wks; since I had trach put in" (05/04/2018)  . Peripheral vascular disease ([place])   . Stroke [facility])    "he's had several little strokes; many that he wasn't aware of" (05/04/2018)   . Tobacco abuse   . Tracheal stenosis     Patient Active Problem List   Diagnosis Date Noted  . Statin myopathy 07/04/2020  . Hx of CABG 06/06/2020  . Sore throat 06/06/2020  . NSTEMI (non-ST elevated myocardial infarction) ([place]) 06/04/2020  . Pulmonary infiltrates 11/11/2018  . HCAP (healthcare-associated pneumonia) 10/14/2018  . Acute on chronic respiratory failure with hypoxia ([place]) 09/06/2018  . 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 Pneumonia due to infectious organism   . SOB (shortness of breath) 09/05/2018  . Carotid stenosis, symptomatic, with infarction ([place]) 05/04/2018  . Ischemic stroke ([place]) 11/15/2017  . TIA (transient ischemic attack) 11/14/2017  . Constipation 09/15/2017  . Chest pain 10/06/2016  . Essential hypertension 10/06/2016  . 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 DOE (dyspnea on exertion) 12/05/2015  . 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 Aftercare following surgery of the circulatory system, [place] 08/03/2013  . Occlusion and stenosis of carotid artery without mention of cerebral infarction 07/21/2012  . Preop cardiovascular exam 05/08/2012  . Carotid stenosis, bilateral 05/05/2012  . CAD S/P percutaneous coronary angioplasty 08/13/2011  . Malignant neoplasm of larynx ([place]) 12/14/2009  . HLD (hyperlipidemia) 12/14/2009  . CEREBROVASCULAR ACCIDENT 12/14/2009  . GASTROESOPHAGEAL REFLUX DISEASE 12/14/2009  . 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 CAROTID STENT Right 06-13-12; 05/04/2018  .  CAROTID STENT INSERTION N/A 06/13/2012   Procedure: CAROTID STENT INSERTION;  Surgeon: Serafina Mitchell, MD;  Location: [facility] CATH LAB;  Service: Cardiovascular;  Laterality: N/A;  . CATARACT EXTRACTION, BILATERAL Bilateral   . COLONOSCOPY  11/10/2011   Dr. [name]: hemorrhoids, tubular adenoma  . CORNEAL TRANSPLANT Bilateral   . CORONARY ARTERY BYPASS GRAFT  ~ 2003   "CABG X3"  . CORONARY STENT INTERVENTION N/A 06/05/2020    Procedure: CORONARY STENT INTERVENTION;  Surgeon: Lorretta Harp, MD;  Location: [place] CV LAB;  Service: Cardiovascular;  Laterality: N/A;  . EYE SURGERY    . INSERTION OF RETROGRADE CAROTID STENT Right 05/04/2018   Procedure: INSERTION OF RIGHT  CAROTID STENT using an ABBOT- XACT carotid stent system;  Surgeon: Serafina Mitchell, MD;  Location: [place];  Service: Vascular;  Laterality: Right;  . LAPAROSCOPIC CHOLECYSTECTOMY  2007  . LEFT HEART CATH AND CORS/GRAFTS ANGIOGRAPHY N/A 10/07/2016   Procedure: Left Heart Cath and Cors/Grafts Angiography;  Surgeon: Troy Sine, MD;  Location: [place] CV LAB;  Service: Cardiovascular;  Laterality: N/A;  . LEFT HEART CATH AND CORS/GRAFTS ANGIOGRAPHY N/A 06/05/2020   Procedure: LEFT HEART CATH AND CORS/GRAFTS ANGIOGRAPHY;  Surgeon: Lorretta Harp, MD;  Location: [place] CV LAB;  Service: Cardiovascular;  Laterality: N/A;  . PARTIAL LARYNGECTOMY  1998  . TRACHEOSTOMY  03/13/2018   "@ [facility]"       Family History  Problem Relation Age of Onset  . Dementia Mother   . Heart disease Father   . Cancer Brother   . Heart disease Brother   . Hyperlipidemia Brother   . Cancer Brother   . Colon cancer Neg Hx     Social History   Tobacco Use  . Smoking status: Former Smoker    Packs/day: 1.00    Years: 15.00    Pack years: 15.00    Types: Cigarettes    Start date: 04/22/1953    Quit date: 07/26/1970    Years since quitting: 50.3  . Smokeless tobacco: Former Systems developer    Types: Chew    Quit date: 05/05/2002  Vaping Use  . Vaping Use: Never used  Substance Use Topics  . Alcohol use: Never    Alcohol/week: 0.0 standard drinks  . Drug use: Never    Home Medications Prior to Admission medications   Medication Sig Start Date End Date Taking? Authorizing Provider  acetaminophen (TYLENOL) 500 MG tablet Take 500 mg by mouth every 6 (six) hours as needed.   Yes [provider]  albuterol (PROVENTIL) (2.5 MG/3ML) 0.083%  nebulizer solution Take 3 mLs (2.5 mg total) by nebulization 3 (three) times daily. X 5 days and then q 6 hrs prn after that Patient taking differently: Take 2.5 mg by nebulization 3 (three) times daily. X 5 days and then every 6 hrs as needed after that 09/29/18  Yes Emokpae, Courage, MD  Alirocumab (PRALUENT) 75 MG/ML SOAJ Inject 1 pen into the skin every 14 (fourteen) days. 07/04/20  Yes Arnoldo Lenis, MD  amLODipine (NORVASC) 10 MG tablet Take 1 tablet by mouth once daily 05/05/20  Yes Branch, Alphonse Guild, MD  aspirin EC 81 MG tablet Take 81 mg by mouth daily. Swallow whole.   Yes [provider]  clopidogrel (PLAVIX) 75 MG tablet Take 1 tablet by mouth once daily 02/13/20  Yes Branch, Alphonse Guild, MD  doxylamine, Sleep, (UNISOM) 25 MG tablet Take 25 mg by mouth at bedtime as needed.  Yes [provider]  ezetimibe (ZETIA) 10 MG tablet Take 1 tablet by mouth once daily 05/26/20  Yes Branch, Alphonse Guild, MD  losartan (COZAAR) 50 MG tablet Take 50 mg by mouth daily.   Yes [provider]  metoprolol tartrate (LOPRESSOR) 25 MG tablet Take 25 mg by mouth 2 (two) times daily.  03/16/17  Yes [provider]  nitroGLYCERIN (NITROSTAT) 0.4 MG SL tablet Place 1 tablet (0.4 mg total) under the tongue every 5 (five) minutes as needed for chest pain. 01/31/17 05/25/21 Yes Lendon Colonel, NP  pantoprazole (PROTONIX) 40 MG tablet Take 1 tablet (40 mg total) by mouth daily. 06/07/20  Yes Kathyrn Drown D, NP  PARoxetine (PAXIL) 20 MG tablet Take 20 mg by mouth at bedtime.   Yes [provider]  spironolactone (ALDACTONE) 25 MG tablet Take 25 mg by mouth daily. 08/29/20  Yes [provider]    Allergies    Nifedipine, Lorazepam, Other, Penicillins, Sulfacetamide, Sulfonamide derivatives, Erythromycin, Statins, and Vancomycin  Review of Systems   Review of Systems  Constitutional: Negative for chills and fever.  HENT: Negative for congestion, rhinorrhea  and sore throat.   Eyes: Negative for visual disturbance.  Respiratory: Negative for cough and shortness of breath.   Cardiovascular: Positive for chest pain. Negative for leg swelling.  Gastrointestinal: Negative for abdominal pain, diarrhea, nausea and vomiting.  Genitourinary: Negative for dysuria.  Musculoskeletal: Negative for back pain and neck pain.  Skin: Negative for rash.  Neurological: Negative for dizziness, light-headedness and headaches.  Hematological: Does not bruise/bleed easily.  Psychiatric/Behavioral: Negative for confusion.    Physical Exam Updated Vital Signs BP 138/75   Pulse (!) 56   Temp 97.8 F (36.6 C) (Oral)   Resp 15   Ht 1.676 m (5\' 6" )   Wt 82.6 kg   SpO2 100%   BMI 29.38 kg/m   Physical Exam Vitals and nursing note reviewed.  Constitutional:      Appearance: Normal appearance. He is well-developed.  HENT:     Head: Normocephalic and atraumatic.  Eyes:     Conjunctiva/sclera: Conjunctivae normal.     Pupils: Pupils are equal, round, and reactive to light.  Neck:     Comments: Trach tube and tracheostomy in place Cardiovascular:     Rate and Rhythm: Normal rate and regular rhythm.     Heart sounds: No murmur heard.   Pulmonary:     Effort: Pulmonary effort is normal. No respiratory distress.     Breath sounds: Normal breath sounds.  Chest:     Chest wall: No tenderness.  Abdominal:     Palpations: Abdomen is soft.     Tenderness: There is no abdominal tenderness.  Musculoskeletal:        General: Normal range of motion.     Cervical back: Neck supple.  Skin:    General: Skin is warm and dry.     Capillary Refill: Capillary refill takes less than 2 seconds.  Neurological:     General: No focal deficit present.     Mental Status: He is alert and oriented to person, place, and time.     Cranial Nerves: No cranial nerve deficit.     Sensory: No sensory deficit.     ED Results / Procedures / Treatments   Labs (all labs ordered  are listed, but only abnormal results are displayed) Labs Reviewed  COMPREHENSIVE METABOLIC PANEL - Abnormal; Notable for the following components:  Result Value   Glucose, Bld 116 (*)    Creatinine, Ser 1.63 (*)    GFR, Estimated 42 (*)    All other components within normal limits  TROPONIN I (HIGH SENSITIVITY) - Abnormal; Notable for the following components:   Troponin I (High Sensitivity) 66 (*)    All other components within normal limits  TROPONIN I (HIGH SENSITIVITY) - Abnormal; Notable for the following components:   Troponin I (High Sensitivity) 93 (*)    All other components within normal limits  TROPONIN I (HIGH SENSITIVITY) - Abnormal; Notable for the following components:   Troponin I (High Sensitivity) 131 (*)    All other components within normal limits  TROPONIN I (HIGH SENSITIVITY) - Abnormal; Notable for the following components:   Troponin I (High Sensitivity) 162 (*)    All other components within normal limits  RESP PANEL BY RT-PCR (FLU A&B, COVID) ARPGX2  CBC WITH DIFFERENTIAL/PLATELET  HEPARIN LEVEL (UNFRACTIONATED)    EKG EKG Interpretation  Date/Time:  Monday Nov 24 2020 07:54:47 EDT Ventricular Rate:  60 PR Interval:  164 QRS Duration: 139 QT Interval:  442 QTC Calculation: 442 R Axis:   28 Text Interpretation: Sinus rhythm Atrial premature complex Right bundle branch block Inferior infarct, age indeterminate No significant change since last tracing Confirmed by Fredia Sorrow [phone]) on 11/24/2020 8:05:44 AM   Radiology DG Chest Port 1 View  Result Date: 11/24/2020 CLINICAL DATA:  Chest pain. EXAM: PORTABLE CHEST 1 VIEW COMPARISON:  June 04, 2020 FINDINGS: Tracheostomy catheter tip is 5.0 cm above the carina. Areas of scarring noted in each mid lung and right lower lung region. No edema or airspace opacity. Heart is upper normal in size with pulmonary vascularity normal. Patient is status post internal mammary bypass grafting. No adenopathy.  No bone lesions. There is a stent in the right carotid artery region. IMPRESSION: Tracheostomy as described without pneumothorax. Scarring bilaterally, stable, without edema or airspace opacity. Stable cardiac silhouette. Postoperative changes noted. Electronically Signed   By: Lowella Grip III M.D.   On: 11/24/2020 08:47    Procedures Procedures   CRITICAL CARE Performed by: Fredia Sorrow Total critical care time: 40 minutes Critical care time was exclusive of separately billable procedures and treating other patients. Critical care was necessary to treat or prevent imminent or life-threatening deterioration. Critical care was time spent personally by me on the following activities: development of treatment plan with patient and/or surrogate as well as nursing, discussions with consultants, evaluation of patient's response to treatment, examination of patient, obtaining history from patient or surrogate, ordering and performing treatments and interventions, ordering and review of laboratory studies, ordering and review of radiographic studies, pulse oximetry and re-evaluation of patient's condition. Medications Ordered in ED Medications  nitroGLYCERIN (NITROSTAT) SL tablet 0.4 mg (0.4 mg Sublingual Given 11/24/20 0825)  heparin ADULT infusion 100 units/mL (25000 units/262mL) (1,000 Units/hr Intravenous New Bag/Given 11/24/20 1434)  aspirin chewable tablet 324 mg (has no administration in time range)    Or  aspirin suppository 300 mg (has no administration in time range)  aspirin EC tablet 81 mg (has no administration in time range)  nitroGLYCERIN (NITROSTAT) SL tablet 0.4 mg (has no administration in time range)  acetaminophen (TYLENOL) tablet 650 mg (has no administration in time range)  ondansetron (ZOFRAN) injection 4 mg (has no administration in time range)  albuterol (PROVENTIL) (2.5 MG/3ML) 0.083% nebulizer solution 2.5 mg (has no administration in time range)  amLODipine (NORVASC)  tablet 10 mg (has  no administration in time range)  clopidogrel (PLAVIX) tablet 75 mg (has no administration in time range)  diphenhydrAMINE (BENADRYL) capsule 25 mg (has no administration in time range)  ezetimibe (ZETIA) tablet 10 mg (has no administration in time range)  metoprolol tartrate (LOPRESSOR) tablet 25 mg (has no administration in time range)  pantoprazole (PROTONIX) EC tablet 40 mg (has no administration in time range)  PARoxetine (PAXIL) tablet 20 mg (has no administration in time range)  spironolactone (ALDACTONE) tablet 25 mg (has no administration in time range)  heparin injection 4,000 Units (4,000 Units Intravenous Given 11/24/20 1438)    ED Course  I have reviewed the triage vital signs and the nursing notes.  Pertinent labs & imaging results that were available during my care of the patient were reviewed by me and considered in my medical decision making (see chart for details).    MDM Rules/Calculators/A&P                         Patient with gradually increasing troponins.  Highest has been about 100.  Certainly not in MI range.  Discussed with Dr. [name] from cardiology.  Due to the changes he will be started on heparin.  In the planning to admit him down to [facility] considering recatheterization.  Patient certainly has significant cardiac risk factors.  This may very well be unstable angina.  Patient's pain went completely away here with nitroglycerin.  Has not reoccurred.  Patient received heparin bolus and heparin drip.  Cardiology will admit to their service.  Final Clinical Impression(s) / ED Diagnoses Final diagnoses:  Precordial pain  Elevated troponin  Unstable angina [facility])    Rx / DC Orders ED Discharge Orders    None       Fredia Sorrow, MD 11/24/20 1615

## 2020-11-24 NOTE — ED Notes (Signed)
Pt in bed, sig other at bedside, pt offers no complaints at this time.

## 2020-11-25 DIAGNOSIS — I4510 Unspecified right bundle-branch block: Secondary | ICD-10-CM | POA: Diagnosis present

## 2020-11-25 DIAGNOSIS — E785 Hyperlipidemia, unspecified: Secondary | ICD-10-CM | POA: Diagnosis present

## 2020-11-25 DIAGNOSIS — K219 Gastro-esophageal reflux disease without esophagitis: Secondary | ICD-10-CM | POA: Diagnosis present

## 2020-11-25 DIAGNOSIS — Z8521 Personal history of malignant neoplasm of larynx: Secondary | ICD-10-CM | POA: Diagnosis not present

## 2020-11-25 DIAGNOSIS — I10 Essential (primary) hypertension: Secondary | ICD-10-CM | POA: Diagnosis not present

## 2020-11-25 DIAGNOSIS — Z87442 Personal history of urinary calculi: Secondary | ICD-10-CM | POA: Diagnosis not present

## 2020-11-25 DIAGNOSIS — Z8673 Personal history of transient ischemic attack (TIA), and cerebral infarction without residual deficits: Secondary | ICD-10-CM | POA: Diagnosis not present

## 2020-11-25 DIAGNOSIS — N1831 Chronic kidney disease, stage 3a: Secondary | ICD-10-CM | POA: Diagnosis present

## 2020-11-25 DIAGNOSIS — R7303 Prediabetes: Secondary | ICD-10-CM | POA: Diagnosis present

## 2020-11-25 DIAGNOSIS — I129 Hypertensive chronic kidney disease with stage 1 through stage 4 chronic kidney disease, or unspecified chronic kidney disease: Secondary | ICD-10-CM | POA: Diagnosis present

## 2020-11-25 DIAGNOSIS — Z930 Tracheostomy status: Secondary | ICD-10-CM | POA: Diagnosis not present

## 2020-11-25 DIAGNOSIS — Z951 Presence of aortocoronary bypass graft: Secondary | ICD-10-CM | POA: Diagnosis not present

## 2020-11-25 DIAGNOSIS — T82855A Stenosis of coronary artery stent, initial encounter: Secondary | ICD-10-CM | POA: Diagnosis present

## 2020-11-25 DIAGNOSIS — Y712 Prosthetic and other implants, materials and accessory cardiovascular devices associated with adverse incidents: Secondary | ICD-10-CM | POA: Diagnosis present

## 2020-11-25 DIAGNOSIS — R072 Precordial pain: Secondary | ICD-10-CM | POA: Diagnosis present

## 2020-11-25 DIAGNOSIS — N179 Acute kidney failure, unspecified: Secondary | ICD-10-CM | POA: Diagnosis present

## 2020-11-25 DIAGNOSIS — I214 Non-ST elevation (NSTEMI) myocardial infarction: Secondary | ICD-10-CM | POA: Diagnosis present

## 2020-11-25 DIAGNOSIS — Z923 Personal history of irradiation: Secondary | ICD-10-CM | POA: Diagnosis not present

## 2020-11-25 DIAGNOSIS — Z880 Allergy status to penicillin: Secondary | ICD-10-CM | POA: Diagnosis not present

## 2020-11-25 DIAGNOSIS — Z881 Allergy status to other antibiotic agents status: Secondary | ICD-10-CM | POA: Diagnosis not present

## 2020-11-25 DIAGNOSIS — N1832 Chronic kidney disease, stage 3b: Secondary | ICD-10-CM | POA: Diagnosis not present

## 2020-11-25 DIAGNOSIS — I25110 Atherosclerotic heart disease of native coronary artery with unstable angina pectoris: Secondary | ICD-10-CM | POA: Diagnosis present

## 2020-11-25 DIAGNOSIS — Z20822 Contact with and (suspected) exposure to covid-19: Secondary | ICD-10-CM | POA: Diagnosis present

## 2020-11-25 DIAGNOSIS — R001 Bradycardia, unspecified: Secondary | ICD-10-CM | POA: Diagnosis present

## 2020-11-25 DIAGNOSIS — I739 Peripheral vascular disease, unspecified: Secondary | ICD-10-CM | POA: Diagnosis present

## 2020-11-25 DIAGNOSIS — I252 Old myocardial infarction: Secondary | ICD-10-CM | POA: Diagnosis not present

## 2020-11-25 DIAGNOSIS — Z882 Allergy status to sulfonamides status: Secondary | ICD-10-CM | POA: Diagnosis not present

## 2020-11-25 DIAGNOSIS — I2510 Atherosclerotic heart disease of native coronary artery without angina pectoris: Secondary | ICD-10-CM | POA: Diagnosis not present

## 2020-11-25 DIAGNOSIS — Z888 Allergy status to other drugs, medicaments and biological substances status: Secondary | ICD-10-CM | POA: Diagnosis not present

## 2020-11-25 LAB — BASIC METABOLIC PANEL
Anion gap: 8 (ref 5–15)
Anion gap: 7 (ref 5–15)
Anion gap: 4 — ABNORMAL LOW (ref 5–15)
BUN: 21 mg/dL (ref 8–23)
BUN: 22 mg/dL (ref 8–23)
BUN: 21 mg/dL (ref 8–23)
CO2: 26 mmol/L (ref 22–32)
CO2: 28 mmol/L (ref 22–32)
CO2: 27 mmol/L (ref 22–32)
Calcium: 9.1 mg/dL (ref 8.9–10.3)
Calcium: 9.2 mg/dL (ref 8.9–10.3)
Calcium: 8.6 mg/dL — ABNORMAL LOW (ref 8.9–10.3)
Chloride: 102 mmol/L (ref 98–111)
Chloride: 104 mmol/L (ref 98–111)
Chloride: 106 mmol/L (ref 98–111)
Creatinine, Ser: 1.78 mg/dL — ABNORMAL HIGH (ref 0.61–1.24)
Creatinine, Ser: 1.82 mg/dL — ABNORMAL HIGH (ref 0.61–1.24)
Creatinine, Ser: 2.12 mg/dL — ABNORMAL HIGH (ref 0.61–1.24)
GFR, Estimated: 38 mL/min — ABNORMAL LOW (ref >60)
GFR, Estimated: 37 mL/min — ABNORMAL LOW (ref >60)
GFR, Estimated: 31 mL/min — ABNORMAL LOW (ref >60)
Glucose, Bld: 113 mg/dL — ABNORMAL HIGH (ref 70–99)
Glucose, Bld: 109 mg/dL — ABNORMAL HIGH (ref 70–99)
Glucose, Bld: 89 mg/dL (ref 70–99)
Potassium: 4.3 mmol/L (ref 3.5–5.1)
Potassium: 4.9 mmol/L (ref 3.5–5.1)
Potassium: 4.7 mmol/L (ref 3.5–5.1)
Sodium: 136 mmol/L (ref 135–145)
Sodium: 139 mmol/L (ref 135–145)
Sodium: 137 mmol/L (ref 135–145)

## 2020-11-25 LAB — CBC
HCT: 39.1 % (ref 39.0–52.0)
Hemoglobin: 12.9 g/dL — ABNORMAL LOW (ref 13.0–17.0)
MCH: 30.9 pg (ref 26.0–34.0)
MCHC: 33 g/dL (ref 30.0–36.0)
MCV: 93.5 fL (ref 80.0–100.0)
Platelets: 227 10*3/uL (ref 150–400)
RBC: 4.18 MIL/uL — ABNORMAL LOW (ref 4.22–5.81)
RDW: 14.1 % (ref 11.5–15.5)
WBC: 8 10*3/uL (ref 4.0–10.5)
nRBC: 0 % (ref 0.0–0.2)

## 2020-11-25 LAB — HEPARIN LEVEL (UNFRACTIONATED)
Heparin Unfractionated: 0.24 IU/mL — ABNORMAL LOW (ref 0.30–0.70)
Heparin Unfractionated: 0.39 IU/mL (ref 0.30–0.70)

## 2020-11-25 MED ORDER — ALBUTEROL SULFATE (2.5 MG/3ML) 0.083% IN NEBU: 2.5000 mg | INHALATION_SOLUTION | Freq: Four times a day (QID) | RESPIRATORY_TRACT | Status: DC | PRN

## 2020-11-25 NOTE — Progress Notes (Addendum)
The patient has been seen in conjunction with Reino Bellis, NP. All aspects of care have been considered and discussed. The patient has been personally interviewed, examined, and all clinical data has been reviewed.   With creatinine trending up, have decided to hold spironolactone, losartan, and hydrate overnight for cath to be done tomorrow.   Progress Note  Patient Name: Kenneth [name] Date of Encounter: 11/25/2020  [facility] Cardiologist: Carlyle Dolly, MD   Subjective   No chest pain overnight. Initially planned for cath today, but given rise in Cr-->plan for tomorrow  Inpatient Medications    Scheduled Meds: . amLODipine  10 mg Oral Daily  . [START ON 11/26/2020] aspirin EC  81 mg Oral Daily  . clopidogrel  75 mg Oral Daily  . ezetimibe  10 mg Oral Daily  . metoprolol tartrate  25 mg Oral BID  . pantoprazole  40 mg Oral Daily  . PARoxetine  20 mg Oral QHS  . sodium chloride flush  3 mL Intravenous Q12H   Continuous Infusions: . sodium chloride    . sodium chloride 10 mL/hr at 11/25/20 0517  . heparin 1,100 Units/hr (11/25/20 0517)   PRN Meds: sodium chloride, acetaminophen, albuterol, diphenhydrAMINE, nitroGLYCERIN, ondansetron (ZOFRAN) IV, sodium chloride flush   Vital Signs    Vitals:   11/25/20 0312 11/25/20 0422 11/25/20 0842 11/25/20 0847  BP:  108/61  (!) 116/59  Pulse: (!) 55 (!) 52 (!) 108 (!) 55  Resp: 18 20 18 18   Temp:  97.8 F (36.6 C)  98.3 F (36.8 C)  TempSrc:  Oral  Oral  SpO2: 98% 94% 95% 99%  Weight:  80.5 kg    Height:        Intake/Output Summary (Last 24 hours) at 11/25/2020 1013 Last data filed at 11/25/2020 0848 Gross per 24 hour  Intake 384.14 ml  Output 1830 ml  Net -1445.86 ml   Last 3 Weights 11/25/2020 11/24/2020 11/24/2020  Weight (lbs) 177 lb 8 oz 177 lb 11.1 oz 182 lb  Weight (kg) 80.513 kg 80.6 kg 82.555 kg      Telemetry    SB, rates in the 28s - Personally Reviewed  ECG    Sinus Bradycardia, 52bpm, RBBB,  TWI in inferolateral leads  Physical Exam   GEN: No acute distress.   Neck: No JVD, tach Cardiac: RRR, no murmurs, rubs, or gallops.  Respiratory: Clear to auscultation bilaterally. GI: Soft, nontender, non-distended  MS: No edema; No deformity. Neuro:  Nonfocal  Psych: Normal affect   Labs    High Sensitivity Troponin:   Recent Labs  Lab 11/24/20 0820 11/24/20 1021 11/24/20 1230 11/24/20 1409  TROPONINIHS 66* 93* 131* 162*      Chemistry Recent Labs  Lab 11/24/20 0820 11/25/20 0414 11/25/20 0637  NA 137 136 139  K 4.3 4.3 4.9  CL 103 102 104  CO2 26 26 28   GLUCOSE 116* 113* 109*  BUN 22 21 22   CREATININE 1.63* 1.78* 1.82*  CALCIUM 9.0 9.1 9.2  PROT 6.7  --   --   ALBUMIN 3.6  --   --   AST 15  --   --   ALT 14  --   --   ALKPHOS 110  --   --   BILITOT 0.5  --   --   GFRNONAA 42* 38* 37*  ANIONGAP 8 8 7      Hematology Recent Labs  Lab 11/24/20 0820 11/25/20 0414  WBC 9.4 8.0  RBC 4.25 4.18*  HGB 13.2 12.9*  HCT 40.7 39.1  MCV 95.8 93.5  MCH 31.1 30.9  MCHC 32.4 33.0  RDW 14.3 14.1  PLT 245 227    BNPNo results for input(s): BNP, PROBNP in the last 168 hours.   DDimer No results for input(s): DDIMER in the last 168 hours.   Radiology    DG Chest Port 1 View  Result Date: 11/24/2020 CLINICAL DATA:  Chest pain. EXAM: PORTABLE CHEST 1 VIEW COMPARISON:  June 04, 2020 FINDINGS: Tracheostomy catheter tip is 5.0 cm above the carina. Areas of scarring noted in each mid lung and right lower lung region. No edema or airspace opacity. Heart is upper normal in size with pulmonary vascularity normal. Patient is status post internal mammary bypass grafting. No adenopathy. No bone lesions. There is a stent in the right carotid artery region. IMPRESSION: Tracheostomy as described without pneumothorax. Scarring bilaterally, stable, without edema or airspace opacity. Stable cardiac silhouette. Postoperative changes noted. Electronically Signed   By: Lowella Grip III M.D.   On: 11/24/2020 08:47    Cardiac Studies   Echo: 05/2020  IMPRESSIONS    1. Left ventricular ejection fraction, by estimation, is 55%. The left  ventricle demonstrates regional wall motion abnormalities with basal  inferior and basal inferoseptal akinesis. Left ventricular diastolic  parameters are consistent with Grade II  diastolic dysfunction (pseudonormalization). LV systolic function is  somewhat improved compared to echo yesterday.  2. Right ventricular systolic function is mildly reduced. The right  ventricular size is mildly enlarged.  3. Left atrial size was mildly dilated.  4. The mitral valve is normal in structure. Trivial mitral valve  regurgitation. No evidence of mitral stenosis.  5. The aortic valve is tricuspid. Aortic valve regurgitation is not  visualized. Mild aortic valve sclerosis is present, with no evidence of  aortic valve stenosis.  6. Limited echo   Patient Profile     80 y.o. male  with  history of CAD status post CABG 2003 STEMI treated with DES to the RCA 02/2020, NSTEMI 08/06/2019 with DES to the RCA, previously placed mid RCA stent and LIMA to the diagonal graft was patent, RIMA to the LAD was poorly visualized.  Echo showed LVEF 40 to 45%. Seen at [facility] for recurrent chest pain.   Assessment & Plan    1. NSTEMI with CAD s/p CABG: hsTn peak 162. EKG with inferior q waves. Transferred from [place] to [facility] for further management with plans for cardiac cath. Cr up from 1.7>>1.8 this morning. Will defer cath today, and plan for IVFs today. Hold spiro -- 75cc/hr NS -- continue ASA, Zetia, BB and amlodipine  -- NPO at midnight with plans for cath tomorrow @11 :30 with Dr. Claiborne Billings  2. HLD: statin intolerant 2/2 rash, on praluent and Zetia  3. HTN: controlled, will continue BB, amlodipine. Losartan held prior to transfer. -- spiro held today with AKI  4. CKD II: baseline Cr around 1.4-1.5, up to 1.8 today.  -- spiro held, started on  IVFs @75cc /hr  5. Hx of TIA: on statin  6. Hx of vocal cord CA s/p tach   For questions or updates, please contact [place] Please consult [email or web address] for contact info under        Signed, Reino Bellis, NP  11/25/2020, 10:13 AM

## 2020-11-25 NOTE — Progress Notes (Signed)
CARDIAC REHAB PHASE I   Offered to walk with pt. Pt states some residual chest pressure. Upset cath wasn't today, but understanding reasoning. Provided support and encouragement. Will continue to follow.  [phone] Rufina Falco, RN BSN 11/25/2020 2:46 PM

## 2020-11-25 NOTE — Plan of Care (Signed)
  Problem: Clinical Measurements: Goal: Respiratory complications will improve Outcome: Progressing Goal: Cardiovascular complication will be avoided Outcome: Progressing   Problem: Pain Managment: Goal: General experience of comfort will improve Outcome: Progressing   Problem: Safety: Goal: Ability to remain free from injury will improve Outcome: Progressing   

## 2020-11-25 NOTE — Progress Notes (Signed)
ANTICOAGULATION CONSULT NOTE - Follow Up Consult  Pharmacy Consult for heparin Indication: chest pain/ACS  Allergies  Allergen Reactions  . Nifedipine Other (See Comments)  . Lorazepam Other (See Comments)    REACTION: Alters mental status. "Turns into maniac" REACTION: Alters mental status. "Turns into maniac" REACTION: Alters mental status. "Turns into maniac"  . Other     REACTION: Unknown to patient. States that MD states allergy per medical records  . Penicillins Hives    Has patient had a PCN reaction causing immediate rash, facial/tongue/throat swelling, SOB or lightheadedness with hypotension: Yes Has patient had a PCN reaction causing severe rash involving mucus membranes or skin necrosis: No Has patient had a PCN reaction that required hospitalization No Has patient had a PCN reaction occurring within the last 10 years: No If all of the above answers are "NO", then may proceed with Cephalosporin use.  HAS TOLERATED: cephalexin  . Sulfacetamide     REACTION: Unknown to patient. States that MD states allergy per medical records  . Sulfonamide Derivatives Other (See Comments)    REACTION: Unknown to patient. States that MD states allergy per medical records  . Erythromycin Rash  . Statins Itching and Rash    Rash on his trunk and arms, swelling in his lips  . Vancomycin Itching    Patient Measurements: Height: 5\' 6"  (167.6 cm) Weight: 80.5 kg (177 lb 8 oz) (scale b) IBW/kg (Calculated) : 63.8 Heparin Dosing Weight:  80 kg  Vital Signs: Temp: 98.3 F (36.8 C) (05/03 1112) Temp Source: Oral (05/03 1112) BP: 116/65 (05/03 1112) Pulse Rate: 58 (05/03 1153)  Labs: Recent Labs    11/24/20 0820 11/24/20 1021 11/24/20 1230 11/24/20 1409 11/24/20 2240 11/25/20 0414 11/25/20 0637 11/25/20 1222  HGB 13.2  --   --   --   --  12.9*  --   --   HCT 40.7  --   --   --   --  39.1  --   --   PLT 245  --   --   --   --  227  --   --   HEPARINUNFRC  --   --   --   --   0.34 0.24*  --  0.39  CREATININE 1.63*  --   --   --   --  1.78* 1.82*  --   TROPONINIHS 66* 93* 131* 162*  --   --   --   --     Estimated Creatinine Clearance: 31.7 mL/min (A) (by C-G formula based on SCr of 1.82 mg/dL (H)).  Assessment:  Anticoag: IV Heparin for CP. CBC WNL. Hep level 0.39 in goal.   Goal of Therapy:  Heparin level 0.3-0.7 units/ml Monitor platelets by anticoagulation protocol: Yes   Plan:  Con't IV heparin at 1100 units/hr Daily HL and CBCr. Plan cath 5/4.   Orville Mena S. Alford Highland, PharmD, BCPS Clinical Staff Pharmacist [email or web address] Alford Highland, [employer] 11/25/2020,1:25 PM

## 2020-11-26 ENCOUNTER — Encounter (HOSPITAL_COMMUNITY)
Admission: EM | Disposition: A | Discharge status: Home / Self Care | Payer: Self-pay | Source: Home / Self Care | Attending: Interventional Cardiology

## 2020-11-26 DIAGNOSIS — N1832 Chronic kidney disease, stage 3b: Secondary | ICD-10-CM

## 2020-11-26 DIAGNOSIS — R7989 Other specified abnormal findings of blood chemistry: Secondary | ICD-10-CM

## 2020-11-26 DIAGNOSIS — R778 Other specified abnormalities of plasma proteins: Secondary | ICD-10-CM

## 2020-11-26 DIAGNOSIS — I25110 Atherosclerotic heart disease of native coronary artery with unstable angina pectoris: Secondary | ICD-10-CM

## 2020-11-26 DIAGNOSIS — I200 Unstable angina: Secondary | ICD-10-CM | POA: Insufficient documentation

## 2020-11-26 HISTORY — PX: LEFT HEART CATH AND CORS/GRAFTS ANGIOGRAPHY: CATH118250

## 2020-11-26 HISTORY — PX: CORONARY BALLOON ANGIOPLASTY: CATH118233

## 2020-11-26 LAB — BASIC METABOLIC PANEL
Anion gap: 5 (ref 5–15)
BUN: 22 mg/dL (ref 8–23)
CO2: 22 mmol/L (ref 22–32)
Calcium: 8.4 mg/dL — ABNORMAL LOW (ref 8.9–10.3)
Chloride: 110 mmol/L (ref 98–111)
Creatinine, Ser: 1.77 mg/dL — ABNORMAL HIGH (ref 0.61–1.24)
GFR, Estimated: 38 mL/min — ABNORMAL LOW (ref >60)
Glucose, Bld: 98 mg/dL (ref 70–99)
Potassium: 4.6 mmol/L (ref 3.5–5.1)
Sodium: 137 mmol/L (ref 135–145)

## 2020-11-26 LAB — CBC
HCT: 37.1 % — ABNORMAL LOW (ref 39.0–52.0)
Hemoglobin: 12 g/dL — ABNORMAL LOW (ref 13.0–17.0)
MCH: 30.9 pg (ref 26.0–34.0)
MCHC: 32.3 g/dL (ref 30.0–36.0)
MCV: 95.6 fL (ref 80.0–100.0)
Platelets: 216 10*3/uL (ref 150–400)
RBC: 3.88 MIL/uL — ABNORMAL LOW (ref 4.22–5.81)
RDW: 14.3 % (ref 11.5–15.5)
WBC: 8.7 10*3/uL (ref 4.0–10.5)
nRBC: 0 % (ref 0.0–0.2)

## 2020-11-26 LAB — POCT ACTIVATED CLOTTING TIME
Activated Clotting Time: 339 seconds
Activated Clotting Time: 190 seconds
Activated Clotting Time: 160 seconds

## 2020-11-26 LAB — HEPARIN LEVEL (UNFRACTIONATED): Heparin Unfractionated: 0.28 IU/mL — ABNORMAL LOW (ref 0.30–0.70)

## 2020-11-26 SURGERY — LEFT HEART CATH AND CORS/GRAFTS ANGIOGRAPHY
Anesthesia: LOCAL

## 2020-11-26 MED ORDER — SODIUM CHLORIDE 0.9% FLUSH: 3.0000 mL | INTRAVENOUS | Status: DC | PRN

## 2020-11-26 MED ORDER — IOHEXOL 350 MG/ML SOLN
INTRAVENOUS | Status: DC | PRN
  Administered 2020-11-26: 70 mL

## 2020-11-26 MED ORDER — ONDANSETRON HCL 4 MG/2ML IJ SOLN: 4.0000 mg | Freq: Four times a day (QID) | INTRAMUSCULAR | Status: DC | PRN

## 2020-11-26 MED ORDER — LABETALOL HCL 5 MG/ML IV SOLN: 10.0000 mg | INTRAVENOUS | Status: AC | PRN

## 2020-11-26 MED ORDER — HEPARIN SODIUM (PORCINE) 5000 UNIT/ML IJ SOLN
5000.0000 [IU] | Freq: Three times a day (TID) | INTRAMUSCULAR | Status: DC
  Administered 2020-11-27: 5000 [IU] via SUBCUTANEOUS
  Filled 2020-11-26: qty 1

## 2020-11-26 MED ORDER — CLOPIDOGREL BISULFATE 75 MG PO TABS
75.0000 mg | ORAL_TABLET | Freq: Every day | ORAL | Status: DC
  Administered 2020-11-27: 75 mg via ORAL
  Filled 2020-11-26: qty 1

## 2020-11-26 MED ORDER — METOPROLOL SUCCINATE ER 25 MG PO TB24
25.0000 mg | ORAL_TABLET | Freq: Every day | ORAL | Status: DC
  Administered 2020-11-27: 25 mg via ORAL
  Filled 2020-11-26: qty 1

## 2020-11-26 MED ORDER — POLYETHYLENE GLYCOL 3350 17 G PO PACK
17.0000 g | PACK | Freq: Every day | ORAL | Status: DC
  Administered 2020-11-27: 17 g via ORAL
  Filled 2020-11-26: qty 1

## 2020-11-26 MED ORDER — ASPIRIN 81 MG PO CHEW
81.0000 mg | CHEWABLE_TABLET | Freq: Every day | ORAL | Status: DC
  Administered 2020-11-27: 81 mg via ORAL
  Filled 2020-11-26: qty 1

## 2020-11-26 MED ORDER — NITROGLYCERIN 1 MG/10 ML FOR IR/CATH LAB
INTRA_ARTERIAL | Status: DC | PRN
  Administered 2020-11-26: 200 ug

## 2020-11-26 MED ORDER — CLOPIDOGREL BISULFATE 75 MG PO TABS
ORAL_TABLET | ORAL | Status: AC
  Filled 2020-11-26: qty 1

## 2020-11-26 MED ORDER — SODIUM CHLORIDE 0.9 % IV SOLN: 250.0000 mL | INTRAVENOUS | Status: DC | PRN

## 2020-11-26 MED ORDER — HEPARIN SODIUM (PORCINE) 1000 UNIT/ML IJ SOLN
INTRAMUSCULAR | Status: AC
  Filled 2020-11-26: qty 1

## 2020-11-26 MED ORDER — LIDOCAINE HCL (PF) 1 % IJ SOLN
INTRAMUSCULAR | Status: DC | PRN
  Administered 2020-11-26: 20 mL

## 2020-11-26 MED ORDER — FENTANYL CITRATE (PF) 100 MCG/2ML IJ SOLN
INTRAMUSCULAR | Status: AC
  Filled 2020-11-26: qty 2

## 2020-11-26 MED ORDER — DIAZEPAM 5 MG PO TABS
5.0000 mg | ORAL_TABLET | Freq: Four times a day (QID) | ORAL | Status: DC | PRN
  Administered 2020-11-26: 5 mg via ORAL
  Filled 2020-11-26: qty 1

## 2020-11-26 MED ORDER — HEPARIN SODIUM (PORCINE) 1000 UNIT/ML IJ SOLN
INTRAMUSCULAR | Status: DC | PRN
  Administered 2020-11-26: 8000 [IU] via INTRAVENOUS

## 2020-11-26 MED ORDER — SODIUM CHLORIDE 0.9 % IV SOLN: INTRAVENOUS | Status: DC

## 2020-11-26 MED ORDER — HEPARIN (PORCINE) IN NACL 1000-0.9 UT/500ML-% IV SOLN
INTRAVENOUS | Status: AC
  Filled 2020-11-26: qty 1000

## 2020-11-26 MED ORDER — MIDAZOLAM HCL 2 MG/2ML IJ SOLN
INTRAMUSCULAR | Status: AC
  Filled 2020-11-26: qty 2

## 2020-11-26 MED ORDER — NITROGLYCERIN 1 MG/10 ML FOR IR/CATH LAB
INTRA_ARTERIAL | Status: AC
  Filled 2020-11-26: qty 10

## 2020-11-26 MED ORDER — ACETAMINOPHEN 325 MG PO TABS: 650.0000 mg | ORAL_TABLET | ORAL | Status: DC | PRN

## 2020-11-26 MED ORDER — FENTANYL CITRATE (PF) 100 MCG/2ML IJ SOLN
INTRAMUSCULAR | Status: DC | PRN
  Administered 2020-11-26 (×2): 25 ug via INTRAVENOUS

## 2020-11-26 MED ORDER — IOHEXOL 350 MG/ML SOLN
INTRAVENOUS | Status: AC
  Filled 2020-11-26: qty 1

## 2020-11-26 MED ORDER — ASPIRIN 81 MG PO CHEW
CHEWABLE_TABLET | ORAL | Status: AC
  Filled 2020-11-26: qty 1

## 2020-11-26 MED ORDER — HYDRALAZINE HCL 20 MG/ML IJ SOLN: 10.0000 mg | INTRAMUSCULAR | Status: AC | PRN

## 2020-11-26 MED ORDER — HEPARIN (PORCINE) IN NACL 1000-0.9 UT/500ML-% IV SOLN
INTRAVENOUS | Status: DC | PRN
  Administered 2020-11-26 (×2): 500 mL

## 2020-11-26 MED ORDER — CLOPIDOGREL BISULFATE 75 MG PO TABS
ORAL_TABLET | ORAL | Status: DC | PRN
  Administered 2020-11-26: 75 mg via ORAL

## 2020-11-26 MED ORDER — LIDOCAINE HCL (PF) 1 % IJ SOLN
INTRAMUSCULAR | Status: AC
  Filled 2020-11-26: qty 30

## 2020-11-26 MED ORDER — SODIUM CHLORIDE 0.9% FLUSH
3.0000 mL | Freq: Two times a day (BID) | INTRAVENOUS | Status: DC
  Administered 2020-11-26 – 2020-11-27 (×2): 3 mL via INTRAVENOUS

## 2020-11-26 MED ORDER — MIDAZOLAM HCL 2 MG/2ML IJ SOLN
INTRAMUSCULAR | Status: DC | PRN
  Administered 2020-11-26 (×2): 1 mg via INTRAVENOUS

## 2020-11-26 SURGICAL SUPPLY — 18 items
BALLN SAPPHIRE ~~LOC~~ 3.25X15 (BALLOONS) ×1 IMPLANT
BALLN WOLVERINE 2.00X10 (BALLOONS) ×2
BALLN WOLVERINE 3.00X15 (BALLOONS) ×2
BALLOON WOLVERINE 2.00X10 (BALLOONS) IMPLANT
BALLOON WOLVERINE 3.00X15 (BALLOONS) ×1 IMPLANT
CATH INFINITI 5 FR IM (CATHETERS) ×2 IMPLANT
CATH INFINITI 5FR MULTPACK ANG (CATHETERS) ×1 IMPLANT
CATH LAUNCHER 6FR JR4 (CATHETERS) ×1 IMPLANT
GUIDEWIRE ANGLED .035X260CM (WIRE) ×2 IMPLANT
KIT HEART LEFT (KITS) ×2 IMPLANT
PACK CARDIAC CATHETERIZATION (CUSTOM PROCEDURE TRAY) ×2 IMPLANT
SHEATH PINNACLE 5F 10CM (SHEATH) ×1 IMPLANT
SHEATH PINNACLE 6F 10CM (SHEATH) ×1 IMPLANT
SHEATH PROBE COVER 6X72 (BAG) ×1 IMPLANT
TRANSDUCER W/STOPCOCK (MISCELLANEOUS) ×2 IMPLANT
TUBING CIL FLEX 10 FLL-RA (TUBING) ×2 IMPLANT
WIRE COUGAR XT STRL 190CM (WIRE) ×1 IMPLANT
WIRE EMERALD 3MM-J .035X150CM (WIRE) ×1 IMPLANT

## 2020-11-26 NOTE — H&P (View-Only) (Signed)
   Creatinine is down to 1.77.  I believe it is okay to proceed with cardiac catheterization using limited contrast today.

## 2020-11-26 NOTE — Progress Notes (Signed)
ANTICOAGULATION CONSULT NOTE - Follow Up Consult  Pharmacy Consult for heparin Indication: NSTEMI  Labs: Recent Labs    11/24/20 0820 11/24/20 1021 11/24/20 1230 11/24/20 1409 11/24/20 2240 11/25/20 0414 11/25/20 0637 11/25/20 1222 11/25/20 1816 11/26/20 0431  HGB 13.2  --   --   --   --  12.9*  --   --   --  12.0*  HCT 40.7  --   --   --   --  39.1  --   --   --  37.1*  PLT 245  --   --   --   --  227  --   --   --  216  HEPARINUNFRC  --   --   --   --    < > 0.24*  --  0.39  --  0.28*  CREATININE 1.63*  --   --   --   --  1.78* 1.82*  --  2.12* 1.77*  TROPONINIHS 66* 93* 131* 162*  --   --   --   --   --   --    < > = values in this interval not displayed.    Assessment: 80yo male subtherapeutic on heparin after one level at lower end of goal; no gtt issues or signs of bleeding per RN.  Goal of Therapy:  Heparin level 0.3-0.7 units/ml   Plan:  Will increase heparin gtt by ~1 units/kg/hr to 1200 units/hr and check level in 8 hours.    Wynona Neat, PharmD, BCPS  11/26/2020,5:46 AM

## 2020-11-26 NOTE — Progress Notes (Signed)
   Creatinine is down to 1.77.  I believe it is okay to proceed with cardiac catheterization using limited contrast today. 

## 2020-11-26 NOTE — Plan of Care (Signed)
  Problem: Clinical Measurements: Goal: Will remain free from infection Outcome: Progressing Goal: Respiratory complications will improve Outcome: Progressing   Problem: Activity: Goal: Risk for activity intolerance will decrease Outcome: Progressing   

## 2020-11-26 NOTE — Progress Notes (Addendum)
The patient has been seen in conjunction with Kenneth Demark, NP. All aspects of care have been considered and discussed. The patient has been personally interviewed, examined, and all clinical data has been reviewed.   Hydrated and creatinine stable.  Ready for angiography today using as little contrast as possible.   Progress Note  Patient Name: Kenneth [name] Date of Encounter: 11/26/2020  [facility] Cardiologist: Carlyle Dolly, MD   Subjective   Patient states he is feeling well. He denied any chest pain, SOB, dizziness. He is asking when can he go for his cardiac cath today. He is wondering if he can shower. He states his HR is slow and he was only taking metoprolol once daily at home prior to this admission.   Inpatient Medications    Scheduled Meds: . amLODipine  10 mg Oral Daily  . aspirin EC  81 mg Oral Daily  . clopidogrel  75 mg Oral Daily  . ezetimibe  10 mg Oral Daily  . [START ON 11/27/2020] metoprolol succinate  25 mg Oral Daily  . pantoprazole  40 mg Oral Daily  . PARoxetine  20 mg Oral QHS  . sodium chloride flush  3 mL Intravenous Q12H   Continuous Infusions: . sodium chloride    . sodium chloride 75 mL/hr at 11/25/20 1110  . heparin 1,200 Units/hr (11/26/20 3810)   PRN Meds: sodium chloride, acetaminophen, albuterol, diphenhydrAMINE, nitroGLYCERIN, ondansetron (ZOFRAN) IV, sodium chloride flush   Vital Signs    Vitals:   11/26/20 0500 11/26/20 0839 11/26/20 0858 11/26/20 0945  BP: 130/64  127/64 127/77  Pulse: (!) 51 60 (!) 50 (!) 51  Resp: 20 18  18   Temp: 98.2 F (36.8 C)   (!) 97.5 F (36.4 C)  TempSrc: Oral   Oral  SpO2: 98% 96%  97%  Weight: 81.7 kg     Height:        Intake/Output Summary (Last 24 hours) at 11/26/2020 0947 Last data filed at 11/26/2020 0806 Gross per 24 hour  Intake 1280.94 ml  Output 2850 ml  Net -1569.06 ml   Last 3 Weights 11/26/2020 11/25/2020 11/24/2020  Weight (lbs) 180 lb 3.2 oz 177 lb 8 oz 177 lb 11.1 oz   Weight (kg) 81.738 kg 80.513 kg 80.6 kg      Telemetry    Sinus bradycardia with HR 47-60s, occasional PVCs, artifacts  - Personally Reviewed  ECG    No new tracing today - Personally Reviewed  Physical Exam   GEN: No acute distress. Laying in bed  Neck: no JVD Cardiac: RRR, bradycardiac, no murmurs, rubs, or gallops.  Respiratory: Clear to auscultation bilaterally. On Room air. Trach in place.  GI: Soft, nontender, non-distended  MS: No BLE edema; No deformity. Neuro:  Alert and oriented x3, follow commands appropriately  Psych: Normal affect   Labs    High Sensitivity Troponin:   Recent Labs  Lab 11/24/20 0820 11/24/20 1021 11/24/20 1230 11/24/20 1409  TROPONINIHS 66* 93* 131* 162*      Chemistry Recent Labs  Lab 11/24/20 0820 11/25/20 0414 11/25/20 0637 11/25/20 1816 11/26/20 0431  NA 137   < > 139 137 137  K 4.3   < > 4.9 4.7 4.6  CL 103   < > 104 106 110  CO2 26   < > 28 27 22   GLUCOSE 116*   < > 109* 89 98  BUN 22   < > 22 21 22   CREATININE 1.63*   < >  1.82* 2.12* 1.77*  CALCIUM 9.0   < > 9.2 8.6* 8.4*  PROT 6.7  --   --   --   --   ALBUMIN 3.6  --   --   --   --   AST 15  --   --   --   --   ALT 14  --   --   --   --   ALKPHOS 110  --   --   --   --   BILITOT 0.5  --   --   --   --   GFRNONAA 42*   < > 37* 31* 38*  ANIONGAP 8   < > 7 4* 5   < > = values in this interval not displayed.     Hematology Recent Labs  Lab 11/24/20 0820 11/25/20 0414 11/26/20 0431  WBC 9.4 8.0 8.7  RBC 4.25 4.18* 3.88*  HGB 13.2 12.9* 12.0*  HCT 40.7 39.1 37.1*  MCV 95.8 93.5 95.6  MCH 31.1 30.9 30.9  MCHC 32.4 33.0 32.3  RDW 14.3 14.1 14.3  PLT 245 227 216    BNPNo results for input(s): BNP, PROBNP in the last 168 hours.   DDimer No results for input(s): DDIMER in the last 168 hours.   Radiology    No results found.  Cardiac Studies   Echo from 06/06/20:  1. Left ventricular ejection fraction, by estimation, is 55%. The left  ventricle  demonstrates regional wall motion abnormalities with basal  inferior and basal inferoseptal akinesis. Left ventricular diastolic  parameters are consistent with Grade II diastolic dysfunction (pseudonormalization).  LV systolic function is somewhat improved compared to echo yesterday.  2. Right ventricular systolic function is mildly reduced. The right  ventricular size is mildly enlarged.  3. Left atrial size was mildly dilated.  4. The mitral valve is normal in structure. Trivial mitral valve  regurgitation. No evidence of mitral stenosis.  5. The aortic valve is tricuspid. Aortic valve regurgitation is not  visualized. Mild aortic valve sclerosis is present, with no evidence of  aortic valve stenosis.  6. Limited echo    Left cardiac cath 06/05/20 by Dr Gwenlyn Found:   Colon Flattery LM to Mid LM lesion is 95% stenosed.  Ost Cx to Prox Cx lesion is 99% stenosed.  Prox LAD lesion is 90% stenosed.  Previously placed Prox RCA to Mid RCA stent (unknown type) is widely patent.  Ost RCA to Prox RCA lesion is 95% stenosed.  RPDA-1 lesion is 70% stenosed.  RPDA-2 lesion is 70% stenosed.   IMPRESSION: Successful proximal RCA PCI drug-eluting stenting in the setting of non-STEMI.  The previously placed stent in the mid RCA was widely patent.  This was placed in [place] in August of this year.  The sequential LIMA to the diagonal and LAD was patent.  The RIMA was not well visualized.  His LVEDP was 21.  The sheath will be removed once ACT falls below 170 and pressure held.  Patient be hydrated overnight, discharged home in the morning.  He left the lab in stable condition.   Patient Profile     80 y.o. 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   Assessment & Plan    NSTEMI - presented with chest pain, transferred to [facility] for cardiac cath - Hs Trop 66 >93 >131 - EKG showed SR, RBBB, inferior Q, slight ST elvation of lead III and aVR - see previous Echo and Cath above for report  - continue heparin gtt, currently chest pain free - continue medical therapy with BB, DAPT (ASA and Plavix), statin  - NPO since MN, cardiac cath planned today  Sinus bradycardia  - HR 47-60s on telemetry while awake, asymptomatic, BP stable - he reports taking only one tablet of metoprolol at home due to slow HR - will discontinue metoprolol 25mg  BID, start Metorpolol XL 25mg  daily tomorrow   CKD stage IIIa - Cr baseline around 1.5-1.7 ranges, 1.63 at admission, uptrending to 2.12 on 11/25/20 - cath was held on 11/25/20, genlte IVF was given, Cr improving to 1.77 today  - non-oliguric  - home med losartan and aldactone held this admission - trend renal index daily, monitor intake and output - continue gentle IVF post cath for hydration   HTN - controlled on almodipine and metoprolol, losartan and aldactone held   HLD - intolerant to statin due to rash, on praluent and Zetia - will add lipid panel   Pre-diabetes - A1C 6.2 % on 09/26/20 [facility] diet   Hx of CVA Hx of R CAS S/P STENT  - on DAPT and lipid lowering therapy as above - no acute neuro changes   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

## 2020-11-26 NOTE — Progress Notes (Signed)
   Total contrast was 70 CC.  ISR treated with ballooning and ? Shock Wave Litho.

## 2020-11-26 NOTE — Progress Notes (Signed)
Site area: rt groin arterial sheath Site Prior to Removal:  Level 0 Pressure Applied For: 20 minutes Manual:   yes Patient Status During Pull:  stable Post Pull Site:  Level 0 Post Pull Instructions Given:  yes Post Pull Pulses Present: rt pt palpable Dressing Applied:  Gauze and tegaderm Bedrest begins @ [id number] Comments:

## 2020-11-26 NOTE — Progress Notes (Signed)
Patient reported patient is doing unnecessary trach care.  He was removing by himself.  Please review in the morning and consult pulmonary if appropriate.

## 2020-11-26 NOTE — Progress Notes (Signed)
Patient frequently taking out inner cannula, at one point demanding that it be changed, not convinced to allow for it to be cleaned.  Tried to explain that it was a reusable cannula, but I wasn't making much headway.  Called respiratory therapist, who said she'd exlained that to him on arrival to floor, and agreed to come back for another conversation.  One reveal that resulted was that he takes out entire trach at times at home. She discussed the danger of doing this to him, but felt he was not convinced, and suggested a pulmonary consult on this point.  This would best be done with wife present or on speaker phone.

## 2020-11-26 NOTE — Interval H&P Note (Signed)
Cath Lab Visit (complete for each Cath Lab visit)  Clinical Evaluation Leading to the Procedure:   ACS: No.  Non-ACS:    Anginal Classification: CCS III  Anti-ischemic medical therapy: Maximal Therapy (2 or more classes of medications)  Non-Invasive Test Results: No non-invasive testing performed  Prior CABG: Previous CABG      History and Physical Interval Note:  11/26/2020 10:28 AM  Kenneth [name]  has presented today for surgery, with the diagnosis of nstemi.  The various methods of treatment have been discussed with the patient and family. After consideration of risks, benefits and other options for treatment, the patient has consented to  Procedure(s): LEFT HEART CATH AND CORS/GRAFTS ANGIOGRAPHY (N/A) as a surgical intervention.  The patient's history has been reviewed, patient examined, no change in status, stable for surgery.  I have reviewed the patient's chart and labs.  Questions were answered to the patient's satisfaction.     Shelva Majestic

## 2020-11-27 ENCOUNTER — Encounter (HOSPITAL_COMMUNITY): Payer: Self-pay | Admitting: Cardiovascular Disease

## 2020-11-27 DIAGNOSIS — Z9861 Coronary angioplasty status: Secondary | ICD-10-CM

## 2020-11-27 DIAGNOSIS — N183 Chronic kidney disease, stage 3 unspecified: Secondary | ICD-10-CM

## 2020-11-27 DIAGNOSIS — N1831 Chronic kidney disease, stage 3a: Secondary | ICD-10-CM

## 2020-11-27 DIAGNOSIS — I10 Essential (primary) hypertension: Secondary | ICD-10-CM

## 2020-11-27 DIAGNOSIS — I2510 Atherosclerotic heart disease of native coronary artery without angina pectoris: Secondary | ICD-10-CM

## 2020-11-27 LAB — BASIC METABOLIC PANEL
Anion gap: 5 (ref 5–15)
BUN: 22 mg/dL (ref 8–23)
CO2: 22 mmol/L (ref 22–32)
Calcium: 8.8 mg/dL — ABNORMAL LOW (ref 8.9–10.3)
Chloride: 109 mmol/L (ref 98–111)
Creatinine, Ser: 1.58 mg/dL — ABNORMAL HIGH (ref 0.61–1.24)
GFR, Estimated: 44 mL/min — ABNORMAL LOW (ref >60)
Glucose, Bld: 100 mg/dL — ABNORMAL HIGH (ref 70–99)
Potassium: 4.5 mmol/L (ref 3.5–5.1)
Sodium: 136 mmol/L (ref 135–145)

## 2020-11-27 LAB — LIPID PANEL
Cholesterol: 115 mg/dL (ref 0–200)
HDL: 31 mg/dL — ABNORMAL LOW (ref >40)
LDL Cholesterol: 44 mg/dL (ref 0–99)
Total CHOL/HDL Ratio: 3.7 RATIO
Triglycerides: 202 mg/dL — ABNORMAL HIGH (ref <150)
VLDL: 40 mg/dL (ref 0–40)

## 2020-11-27 LAB — CBC
HCT: 39.2 % (ref 39.0–52.0)
Hemoglobin: 12.6 g/dL — ABNORMAL LOW (ref 13.0–17.0)
MCH: 30.7 pg (ref 26.0–34.0)
MCHC: 32.1 g/dL (ref 30.0–36.0)
MCV: 95.4 fL (ref 80.0–100.0)
Platelets: 214 10*3/uL (ref 150–400)
RBC: 4.11 MIL/uL — ABNORMAL LOW (ref 4.22–5.81)
RDW: 14.4 % (ref 11.5–15.5)
WBC: 8.9 10*3/uL (ref 4.0–10.5)
nRBC: 0 % (ref 0.0–0.2)

## 2020-11-27 MED ORDER — LOSARTAN POTASSIUM 50 MG PO TABS
50.0000 mg | ORAL_TABLET | Freq: Every day | ORAL | Status: DC
  Administered 2020-11-27: 50 mg via ORAL
  Filled 2020-11-27: qty 1

## 2020-11-27 MED ORDER — METOPROLOL SUCCINATE ER 25 MG PO TB24: 25.0000 mg | ORAL_TABLET | Freq: Every day | ORAL | 3 refills | Status: DC

## 2020-11-27 MED ORDER — SPIRONOLACTONE 25 MG PO TABS
25.0000 mg | ORAL_TABLET | Freq: Every day | ORAL | Status: DC
  Administered 2020-11-27: 25 mg via ORAL
  Filled 2020-11-27: qty 1

## 2020-11-27 NOTE — Plan of Care (Signed)

## 2020-11-27 NOTE — Progress Notes (Signed)
CARDIAC REHAB PHASE I   Offered to walk with pt. Pt declining due to hip pain with ambulation. Pt educated on importance of ASA and Plavix. Pt given MI book. Reviewed diet, exercise, restrictions, and site care. Will refer to CRP II [place] to meet requirements. Pt states he feels better, more energy, denies CP. Hopeful for d/c.  [id number] Rufina Falco, RN BSN 11/27/2020 8:45 AM

## 2020-11-27 NOTE — Discharge Instructions (Addendum)
Femoral Site Care  This sheet gives you information about how to care for yourself after your procedure. Your health care provider may also give you more specific instructions. If you have problems or questions, contact your health care provider. What can I expect after the procedure? After the procedure, it is common to have:  Bruising that usually fades within 1-2 weeks.  Tenderness at the site. Follow these instructions at home: Wound care  Follow instructions from your health care provider about how to take care of your insertion site. Make sure you: ? Wash your hands with soap and water before you change your bandage (dressing). If soap and water are not available, use hand sanitizer. ? Change your dressing as told by your health care provider. ? Leave stitches (sutures), skin glue, or adhesive strips in place. These skin closures may need to stay in place for 2 weeks or longer. If adhesive strip edges start to loosen and curl up, you may trim the loose edges. Do not remove adhesive strips completely unless your health care provider tells you to do that.  Do not take baths, swim, or use a hot tub until your health care provider approves.  You may shower 24-48 hours after the procedure or as told by your health care provider. ? Gently wash the site with plain soap and water. ? Pat the area dry with a clean towel. ? Do not rub the site. This may cause bleeding.  Do not apply powder or lotion to the site. Keep the site clean and dry.  Check your femoral site every day for signs of infection. Check for: ? Redness, swelling, or pain. ? Fluid or blood. ? Warmth. ? Pus or a bad smell. Activity  For the first 2-3 days after your procedure, or as long as directed: ? Avoid climbing stairs as much as possible. ? Do not squat.  Do not lift anything that is heavier than 10 lb (4.5 kg), or the limit that you are told, until your health care provider says that it is safe.  Rest as  directed. ? Avoid sitting for a long time without moving. Get up to take short walks every 1-2 hours.  Do not drive for 24 hours if you were given a medicine to help you relax (sedative). General instructions  Take over-the-counter and prescription medicines only as told by your health care provider.  Keep all follow-up visits as told by your health care provider. This is important. Contact a health care provider if you have:  A fever or chills.  You have redness, swelling, or pain around your insertion site. Get help right away if:  The catheter insertion area swells very fast.  You pass out.  You suddenly start to sweat or your skin gets clammy.  The catheter insertion area is bleeding, and the bleeding does not stop when you hold steady pressure on the area.  The area near or just beyond the catheter insertion site becomes pale, cool, tingly, or numb. These symptoms may represent a serious problem that is an emergency. Do not wait to see if the symptoms will go away. Get medical help right away. Call your local emergency services (911 in the [place] Do not drive yourself to the hospital. Summary  After the procedure, it is common to have bruising that usually fades within 1-2 weeks.  Check your femoral site every day for signs of infection.  Do not lift anything that is heavier than 10 lb (4.5 kg), or   the limit that you are told, until your health care provider says that it is safe. This information is not intended to replace advice given to you by your health care provider. Make sure you discuss any questions you have with your health care provider. Document Revised: 03/14/2020 Document Reviewed: 03/14/2020 Elsevier Patient Education  [place].   Heart Attack A heart attack occurs when blood and oxygen supply to the heart is cut off. A heart attack causes damage to the heart that cannot be fixed. A heart attack is also called a myocardial infarction, or MI. If you  think you are having a heart attack, do not wait to see if the symptoms will go away. Get medical help right away. What are the causes? This condition may be caused by:  A fatty substance (plaque) in the blood vessels (arteries). This can block the flow of blood to the heart.  A blood clot in the blood vessels that go to the heart. The blood clot blocks blood flow.  Low blood pressure.  An abnormal heartbeat.  Some diseases, such as problems in red blood cells (anemia)orproblems in breathing (respiratory failure).  Tightening (spasm) of a blood vessel that cuts off blood to the heart.  A tear in a blood vessel of the heart.  High blood pressure.   What increases the risk? The following factors may make you more likely to develop this condition:  Aging. The older you are, the higher your risk.  Having a personal or family history of chest pain, heart attack, stroke, or narrowing of the arteries in the legs, arms, head, or stomach (peripheral artery disease).  Being male.  Smoking.  Not getting regular exercise.  Being overweight or obese.  Having high blood pressure.  Having high cholesterol.  Having diabetes.  Drinking too much alcohol.  Using illegal drugs, such as cocaine or methamphetamine. What are the signs or symptoms? Symptoms of this condition include:  Chest pain. It may feel like: ? Crushing or squeezing. ? Tightness, pressure, fullness, or heaviness.  Pain in the arm, neck, jaw, back, or upper body.  Shortness of breath.  Heartburn.  Upset stomach (indigestion).  Feeling like you may vomit (nauseous).  Cold sweats.  Feeling tired.  Sudden light-headedness. How is this treated? A heart attack must be treated as soon as possible. Treatment may include:  Medicines to: ? Break up or dissolve blood clots. ? Thin blood and help prevent blood clots. ? Treat blood pressure. ? Improve blood flow to the heart. ? Reduce pain. ? Reduce  cholesterol.  Procedures to widen a blocked artery and keep it open.  Open heart surgery.  Receiving oxygen.  Making your heart strong again (cardiac rehabilitation) through exercise, education, and counseling.   Follow these instructions at home: Medicines  Take over-the-counter and prescription medicines only as told by your doctor. You may need to take medicine: ? To keep your blood from clotting too easily. ? To control blood pressure. ? To lower cholesterol. ? To control heart rhythms.  Do not take these medicines unless your doctor says it is okay: ? NSAIDs, such as ibuprofen. ? Supplements that have vitamin A, vitamin E, or both. ? Hormone replacement therapy that has estrogen with or without progestin. Lifestyle  Do not use any products that have nicotine or tobacco, such as cigarettes, e-cigarettes, and chewing tobacco. If you need help quitting, ask your doctor.  Avoid secondhand smoke.  Exercise regularly. Ask your doctor about a cardiac  rehab program.  Eat heart-healthy foods. Your doctor will tell you what foods to eat.  Stay at a healthy weight.  Lower your stress level.  Do not use illegal drugs.      Alcohol use  Do not drink alcohol if: ? Your doctor tells you not to drink. ? You are pregnant, may be pregnant, or are planning to become pregnant.  If you drink alcohol: ? Limit how much you use to:  0-1 drink a day for women.  0-2 drinks a day for men. ? Know how much alcohol is in your drink. In the [place] one drink equals one 12 oz bottle of beer (355 mL), one 5 oz glass of wine (148 mL), or one 1 oz glass of hard liquor (44 mL). General instructions  Work with your doctor to treat other problems you may have, such as diabetes or high blood pressure.  Get screened for depression. Get treatment if needed.  Keep your vaccines up to date. Get the flu shot (influenza vaccine) every year.  Keep all follow-up visits as told by your doctor. This  is important. Contact a doctor if:  You feel very sad.  You have trouble doing your daily activities. Get help right away if:  You have sudden, unexplained discomfort in your chest, arms, back, neck, jaw, or upper body.  You have shortness of breath.  You have sudden sweating or clammy skin.  You feel like you may vomit.  You vomit.  You feel tired or weak.  You get light-headed or dizzy.  You feel your heart beating fast.  You feel your heart skipping beats.  You have blood pressure that is higher than 180/120. These symptoms may be an emergency. Do not wait to see if the symptoms will go away. Get medical help right away. Call your local emergency services (911 in the [place] Do not drive yourself to the hospital. Summary  A heart attack occurs when blood and oxygen supply to the heart is cut off.  Do not take NSAIDs unless your doctor says it is okay.  Do not smoke. Avoid secondhand smoke.  Exercise regularly. Ask your doctor about a cardiac rehab program. This information is not intended to replace advice given to you by your health care provider. Make sure you discuss any questions you have with your health care provider. Document Revised: 10/23/2018 Document Reviewed: 10/23/2018 Elsevier Patient Education  2021 [place] about your medication: Plavix (anti-platelet agent)  Generic Name (Brand): clopidogrel (Plavix), once daily medication  PURPOSE: You are taking this medication along with aspirin to lower your chance of having a heart attack, stroke, or blood clots in your heart stent. These can be fatal. Plavix and aspirin help prevent platelets from sticking together and forming a clot that can block an artery or your stent.   Common SIDE EFFECTS you may experience include: bruising or bleeding more easily, shortness of breath  Do not stop taking PLAVIX without talking to the doctor who prescribes it for you. People who are treated with a stent  and stop taking Plavix too soon, have a higher risk of getting a blood clot in the stent, having a heart attack, or dying. If you stop Plavix because of bleeding, or for other reasons, your risk of a heart attack or stroke may increase.   Tell all of your doctors and dentists that you are taking Plavix. They should talk to the doctor who prescribed Plavix for you before you have any  surgery or invasive procedure.   Contact your health care provider if you experience: severe or uncontrollable bleeding, pink/red/brown urine, vomiting blood or vomit that looks like "coffee grounds", red or black stools (looks like tar), coughing up blood or blood clots ----------------------------------------------------------------------------------------------------------------------

## 2020-11-27 NOTE — Discharge Summary (Addendum)
The patient has been seen in conjunction with Farrel Demark, NP. All aspects of care have been considered and discussed. The patient has been personally interviewed, examined, and all clinical data has been reviewed.   Digital images reviewed.  Excellent angioplasty result along with scoring balloon.  Kidney function is stable this morning with creatinine 1.58.  Femoral cath site is unremarkable.  Discharge today with follow-up with Dr. [name] in 2 weeks.  Discharge Summary    Patient ID: Kenneth [name] MRN: [id number]; DOB: [birth year]  Admit date: 11/24/2020 Discharge date: 11/27/2020  PCP:  [name], MD   [facility] Providers Cardiologist:  Carlyle Dolly, MD   {   Discharge Diagnoses    Principal Problem:   NSTEMI (non-ST elevated myocardial infarction) [facility]) Active Problems:   CAD S/P percutaneous coronary angioplasty   Essential hypertension   Tracheostomy dependent ([place])   Elevated troponin   CKD (chronic kidney disease) stage 3, GFR 30-59 ml/min [facility])    Diagnostic Studies/Procedures   Left heart cath 11/26/20 by Dr Claiborne Billings:   Previously placed Prox RCA stent (unknown type) is widely patent.  Ost RCA lesion is 30% stenosed.  Mid RCA lesion is 95% stenosed.  Ost LM to Mid LM lesion is 80% stenosed.  Ost Cx to Prox Cx lesion is 95% stenosed.  Post intervention, there is a 0% residual stenosis.  Ost LAD to Prox LAD lesion is 80% stenosed.  Prox LAD lesion is 90% stenosed.  2nd Diag lesion is 80% stenosed.  RV Branch lesion is 80% stenosed.   Severe native CAD with previously noted high-grade stenoses in the left main, proximal and mid LAD,and proximal circumflex.   Mild 30% proximal narrowing in the RCA prior to the proximal patent  stent but evidence for 95 to 99% in-stent restenosis in the mid RCA stent.  Patent sequential LIMA graft supplying the diagonal 1 and mid LAD.  Patent RIMA graft supplying the OM vessel.  Successful PCI to the  mid RCA stent utilizing Wolverine cutting balloon 2.0 x 10 mm, 3.0 x 15 mm and ultimate 3.25 x 15 mm noncompliant balloon dilatations with the 95% stenosis being reduced to 0%.  LVEDP 21 mm Hg.  RECOMMENDATION:  Continue DAPT indefinitely.  Medical therapy for concomitant CAD.  Continue aggressive lipid-lowering therapy with target LDL less than 70 and optimal blood pressure control.    Echo from 06/06/20:  1. Left ventricular ejection fraction, by estimation, is 55%. The left  ventricle demonstrates regional wall motion abnormalities with basal  inferior and basal inferoseptal akinesis. Left ventricular diastolic  parameters are consistent with Grade II diastolic dysfunction (pseudonormalization).  LV systolic function is somewhat improved compared to echo yesterday.  2. Right ventricular systolic function is mildly reduced. The right  ventricular size is mildly enlarged.  3. Left atrial size was mildly dilated.  4. The mitral valve is normal in structure. Trivial mitral valve  regurgitation. No evidence of mitral stenosis.  5. The aortic valve is tricuspid. Aortic valve regurgitation is not  visualized. Mild aortic valve sclerosis is present, with no evidence of  aortic valve stenosis.  6. Limited echo    Left cardiac cath 06/05/20 by Dr Gwenlyn Found:   Colon Flattery LM to Mid LM lesion is 95% stenosed.  Ost Cx to Prox Cx lesion is 99% stenosed.  Prox LAD lesion is 90% stenosed.  Previously placed Prox RCA to Mid RCA stent (unknown type) is widely patent.  Ost RCA to Prox RCA lesion is  95% stenosed.  RPDA-1 lesion is 70% stenosed.  RPDA-2 lesion is 70% stenosed.  IMPRESSION:Successful proximal RCA PCI drug-eluting stenting in the setting of non-STEMI. The previously placed stent in the mid RCA was widely patent. This was placed in [place] in August of this year. The sequential LIMA to the diagonal and LAD was patent. The RIMA was not well visualized. His  LVEDP was 21. The sheath will be removed once ACT falls below 170 and pressure held. Patient be hydrated overnight, discharged home in the morning. He left the lab in stable condition. _____________   History of Present Illness     80 y.o. 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  Patient follows Dr [name] regularly, went to the ER on 11/24/20 for chest pain and pressure.  He initially thought symptoms was triggered by GERD.  He took multiple GI medication which did not help relieving symptoms.  Pain started to improve as patient take nitroglycerin at ED. high sensitive troponin was 66 >93 >131>162.  Admission EKG with sinus rhythm, inferior Q waves, anterior T wave inversion in the setting of RBBB.  His losartan and spironolactone were held.  He was given heparin infusion and continued on aspirin/Plavix/metoprolol.  He was started on gentle IV fluids for hydration due to AKI.  He is subsequently transferred to [facility] for heart catheterization   Hospital Course     Consultants: N/A  NSTEMI - presented with chest pain, transferred to [facility] for cardiac cath 11/24/20 - Hs Trop 66 >93 >131>162 -Admission EKG showed SR, RBBB, inferior Q, anterior TWIs in setting of RBBB - Cath was held on 11/25/2020 due to AKI, renal index improved after gentle hydration, left heart cath completed on 11/26/20 showed In-stent restenosis of mid RCA stent, patent sequential LIMA and RIMA grafts, and severe native CAD with known high grade stenosis of LM, proximal and mid LAD, and proximal Cx. Patient was treated with balloon angioplasty for mid RCA in-stent restenosis.  Recommend continue DAPT indefinitely.  - A1C 6.2% on 09/26/20 and LDL 44 on 11/27/20, at goal  -will discharge on medical therapy with Metoprolol XL 25mg , DAPT (ASA 81mg  and Plavix 75mg ), Zetia and praluent injection   Sinus bradycardia  - HR 45-60s on telemetry while awake, asymptomatic, BP stable - he reports taking only one tablet of metoprolol at home due to slow HR - discontinued metoprolol 25mg  BID, started on Metorpolol XL 25mg  daily this admission, HR 50-60s now, will discharge on this, follow up in the office to determine if further dosing adjustment is needed  AKI on CKD stage IIIb, resolved  - Cr baseline appears around 1.5-1.7 ranges, 1.63 at admission, peaked at 2.12 on 11/25/20, downtrend to 1.58 today with gentle IVF hydration, near baseline  - non-oliguric  - home med losartan and aldactone held this admission, will resume both today -Consider routine monitor of renal function -Discussed with patient to avoid NSAIDs  HTN - controlled on almodipine and metoprolol, losartan and aldactone held for cath, will resume both today   HLD - intolerant to statin due to rash historically, continue praluent and Zetia - LDL 44  on 11/27/20 , at goal   Pre-diabetes - A1C 6.2 % on 09/26/20 [facility] diet   Hx of CVA Hx of R CAS S/P STENT  - on DAPT and lipid lowering therapy as above - no acute neuro changes   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 Aspirin prescribed? - Yes 6. ADP Receptor Inhibitor (Plavix/Clopidogrel, Brilinta/Ticagrelor or Effient/Prasugrel) prescribed (includes medically managed patients)? -  Yes 7. Beta Blocker prescribed? - Yes 8. High Intensity Statin (Lipitor 40-80mg  or Crestor 20-40mg ) prescribed? - No - allergy with rash, on alternative therapy  9. EF assessed during THIS hospitalization? - No - recommend outpatient Echo at follow up appointment 10. For EF <40%, was ACEI/ARB prescribed? - Not Applicable (EF >/= 31%) 11. For EF <40%, Aldosterone Antagonist (Spironolactone or Eplerenone) prescribed? - Not Applicable (EF >/= 51%) 12. Cardiac Rehab Phase II ordered (including medically managed patients)? - Yes       _____________  Discharge Vitals Blood pressure 131/61, pulse (!) 59, temperature 97.7 F (36.5 C), temperature source Oral, resp. rate 18, height 5\' 6"  (1.676 m), weight 81 kg, SpO2 93 %.  Filed Weights   11/25/20 0422 11/26/20 0500 11/27/20 0629  Weight: 80.5 kg 81.7 kg 81 kg    Vitals:  Vitals:   11/27/20 0414 11/27/20 0729  BP: 131/61   Pulse: (!) 55 (!) 59  Resp: 18 18  Temp: 97.7 F (36.5 C)   SpO2: 93%    General Appearance: In no apparent distress, laying in bed HEENT: Normocephalic, atraumatic. EOMs intact. Neck: Tracheostomy in place, patient pulled out inner cannula with small amount of secretion noted, advised patient to reinsert inner cannula (he states this was cleaned by staff last night) Cardiovascular: Regular rhythm, bradycardia, normal S1-S2,  no murmur/rub/gallop Respiratory: Resting breathing unlabored, lungs sounds clear to auscultation bilaterally, no use of accessory muscles. On room air.  No wheezes, rales or rhonchi.   Gastrointestinal: Bowel sounds positive, abdomen soft, non-tender, non-distended. No mass or organomegaly.  Extremities: Able to move all extremities in bed without difficulty, no edema/cyanosis/clubbing, DP pulses were 2+ and equal bilaterally Genitourinary: Urine is clear yellow in urinal Musculoskeletal: Normal muscle bulk and tone, no limited range of motion, no swollen or erythematous joints Skin: Intact,  warm, dry. No rashes or petechiae noted in exposed areas.  Neurologic: Alert, oriented to person, place and time. Fluent speech,no cognitive deficit,  no gross focal neuro deficit Psychiatric: Normal affect. Mood is appropriate.  Right groin cath exit site with Tegaderm dressing in place, no bruise, hematoma, swelling, tenderness.     Labs & Radiologic Studies    CBC Recent Labs    11/26/20 0431 11/27/20 0418  WBC 8.7 8.9  HGB 12.0* 12.6*  HCT 37.1* 39.2  MCV 95.6 95.4  PLT 216 761   Basic Metabolic Panel Recent Labs    11/26/20 0431 11/27/20 0418  NA 137 136  K 4.6 4.5  CL 110 109  CO2 22 22  GLUCOSE 98 100*  BUN  22 22  CREATININE 1.77* 1.58*  CALCIUM 8.4* 8.8*   Liver Function Tests No results for input(s): AST, ALT, ALKPHOS, BILITOT, PROT, ALBUMIN in the last 72 hours. No results for input(s): LIPASE, AMYLASE in the last 72 hours. High Sensitivity Troponin:   Recent Labs  Lab 11/24/20 0820 11/24/20 1021 11/24/20 1230 11/24/20 1409  TROPONINIHS 66* 93* 131* 162*    BNP Invalid input(s): POCBNP D-Dimer No results for input(s): DDIMER in the last 72 hours. Hemoglobin A1C No results for input(s): HGBA1C in the last 72 hours. Fasting Lipid Panel Recent Labs    11/27/20 0418  CHOL 115  HDL 31*  LDLCALC 44  TRIG 202*  CHOLHDL 3.7   Thyroid Function Tests No results for input(s): TSH, T4TOTAL, T3FREE, THYROIDAB in the last 72 hours.  Invalid input(s): FREET3 _____________  CARDIAC CATHETERIZATION  Result Date: 11/26/2020  Previously placed Prox RCA stent (unknown type) is widely patent.  Ost RCA lesion is 30% stenosed.  Mid RCA lesion is 95% stenosed.  Ost LM to Mid LM lesion is 80% stenosed.  Ost Cx to Prox Cx lesion is 95% stenosed.  Post intervention, there is a 0% residual stenosis.  Ost LAD to Prox LAD lesion is 80% stenosed.  Prox LAD lesion is 90% stenosed.  2nd Diag lesion is 80% stenosed.  RV Branch lesion is 80% stenosed.  Severe  native CAD with previously noted high-grade stenoses in the left main, proximal and mid LAD,and proximal circumflex. Mild 30% proximal narrowing in the RCA prior to the proximal patent  stent but evidence for 95 to 99% in-stent restenosis in the mid RCA stent. Patent sequential LIMA graft supplying the diagonal 1 and mid LAD. Patent RIMA graft supplying the OM vessel. Successful PCI to the mid RCA stent utilizing Wolverine cutting balloon 2.0 x 10 mm, 3.0 x 15 mm and ultimate 3.25 x 15 mm noncompliant balloon dilatations with the 95% stenosis being reduced to 0%. LVEDP 21 mm Hg. RECOMMENDATION: Continue DAPT indefinitely.  Medical therapy for concomitant CAD.  Continue aggressive lipid-lowering therapy with target LDL less than 70 and optimal blood pressure control.   DG Chest Port 1 View  Result Date: 11/24/2020 CLINICAL DATA:  Chest pain. EXAM: PORTABLE CHEST 1 VIEW COMPARISON:  June 04, 2020 FINDINGS: Tracheostomy catheter tip is 5.0 cm above the carina. Areas of scarring noted in each mid lung and right lower lung region. No edema or airspace opacity. Heart is upper normal in size with pulmonary vascularity normal. Patient is status post internal mammary bypass grafting. No adenopathy. No bone lesions. There is a stent in the right carotid artery region. IMPRESSION: Tracheostomy as described without pneumothorax. Scarring bilaterally, stable, without edema or airspace opacity. Stable cardiac silhouette. Postoperative changes noted. Electronically Signed   By: Lowella Grip III M.D.   On: 11/24/2020 08:47   Disposition   Patient states he is feeling well, eager to go home .  He denies any chest pain or SOB or dizziness. He states hi trach was cleaned by staff last night .  He denies any pain from right groin site. He denied needing any refills of his medication. He is agreeable with follow up in the office. Patient is being discharged home today in good condition.  Follow-up Plans & Appointments      Discharge Instructions    Amb Referral to Cardiac Rehabilitation   Complete by: As directed    Will send Cardiac Rehab Phase 2 referral to [facility]   Diagnosis:  Coronary Stents   After initial evaluation and assessments completed: Virtual Based Care may be provided alone or in conjunction with Phase 2 Cardiac Rehab based on patient barriers.: Yes   Diet - low sodium heart healthy   Complete by: As directed    Discharge instructions   Complete by: As directed    Please follow up with cardiology as scheduled  Please take your medication as prescribed  Groin Site Care:  Refer to this sheet in the next few weeks. These instructions provide you with information on caring for yourself after your procedure. Your caregiver may also give you more specific instructions. Your treatment has been planned according to current medical practices, but problems sometimes occur. Call your caregiver if you have any problems or questions after your procedure.  HOME CARE INSTRUCTIONS  You may shower 24 hours after the procedure. Remove the bandage (dressing) and gently wash the site with plain soap and water. Gently pat the site dry.   Do not apply powder or lotion to the site.   Do not sit in a bathtub, swimming pool, or whirlpool for 5 to 7 days.   No bending, squatting, or lifting anything over 10 pounds (4.5 kg) as directed by your caregiver.   Inspect the site at least twice daily.   Do not drive home if you are discharged the same day of the procedure. Have someone else drive you.   You may drive 24 hours after the procedure unless otherwise instructed by your caregiver.   What to expect:  Any bruising will usually fade within 1 to 2 weeks.   Blood that collects in the tissue (hematoma) may be painful to the touch. It should usually decrease in size and tenderness within 1 to 2 weeks.   SEEK IMMEDIATE MEDICAL CARE IF:  You have unusual pain at the groin site or down the affected  leg.   You have redness, warmth, swelling, or pain at the groin site.   You have drainage (other than a small amount of blood on the dressing).   You have chills.   You have a fever or persistent symptoms for more than 72 hours.   You have a fever and your symptoms suddenly get worse.   Your leg becomes pale, cool, tingly, or numb.   You have heavy bleeding from the site. Hold pressure on the site.   Increase activity slowly   Complete by: As directed       Discharge Medications   Allergies as of 11/27/2020      Reactions   Nifedipine Other (See Comments)   Lorazepam Other (See Comments)   REACTION: Alters mental status. "Turns into maniac" REACTION: Alters mental status. "Turns into maniac" REACTION: Alters mental status. "Turns into maniac"   Other    REACTION: Unknown to patient. States that MD states allergy per medical records   Penicillins Hives   Has patient had a PCN reaction causing immediate rash, facial/tongue/throat swelling, SOB or lightheadedness with hypotension: Yes Has patient had a PCN reaction causing severe rash involving mucus membranes or skin necrosis: No Has patient had a PCN reaction that required hospitalization No Has patient had a PCN reaction occurring within the last 10 years: No If all of the above answers are "NO", then may proceed with Cephalosporin use. HAS TOLERATED: cephalexin   Sulfacetamide    REACTION: Unknown to patient. States that MD states allergy per medical records   Sulfonamide Derivatives Other (See Comments)   REACTION: Unknown  to patient. States that MD states allergy per medical records   Erythromycin Rash   Statins Itching, Rash   Rash on his trunk and arms, swelling in his lips   Vancomycin Itching      Medication List    STOP taking these medications   metoprolol tartrate 25 MG tablet Commonly known as: LOPRESSOR     TAKE these medications   acetaminophen 500 MG tablet Commonly known as: TYLENOL Take 500 mg  by mouth every 6 (six) hours as needed.   albuterol (2.5 MG/3ML) 0.083% nebulizer solution Commonly known as: PROVENTIL Take 3 mLs (2.5 mg total) by nebulization 3 (three) times daily. X 5 days and then q 6 hrs prn after that What changed: additional instructions   amLODipine 10 MG tablet Commonly known as: NORVASC Take 1 tablet by mouth once daily   aspirin EC 81 MG tablet Take 81 mg by mouth daily. Swallow whole.   clopidogrel 75 MG tablet Commonly known as: PLAVIX Take 1 tablet by mouth once daily   doxylamine (Sleep) 25 MG tablet Commonly known as: UNISOM Take 25 mg by mouth at bedtime as needed.   ezetimibe 10 MG tablet Commonly known as: ZETIA Take 1 tablet by mouth once daily   losartan 50 MG tablet Commonly known as: COZAAR Take 50 mg by mouth daily.   metoprolol succinate 25 MG 24 hr tablet Commonly known as: TOPROL-XL Take 1 tablet (25 mg total) by mouth daily.   nitroGLYCERIN 0.4 MG SL tablet Commonly known as: Nitrostat Place 1 tablet (0.4 mg total) under the tongue every 5 (five) minutes as needed for chest pain.   pantoprazole 40 MG tablet Commonly known as: PROTONIX Take 1 tablet (40 mg total) by mouth daily.   PARoxetine 20 MG tablet Commonly known as: PAXIL Take 20 mg by mouth at bedtime.   Praluent 75 MG/ML Soaj Generic drug: Alirocumab Inject 1 pen into the skin every 14 (fourteen) days.   spironolactone 25 MG tablet Commonly known as: ALDACTONE Take 25 mg by mouth daily.          Outstanding Labs/Studies   Routine labs for renal function monitor and Echo    Duration of Discharge Encounter    Greater than 30 minutes including physician time.  Signed, Margie Billet, NP 11/27/2020, 10:51 AM

## 2020-11-27 NOTE — Plan of Care (Signed)
Problem: Education: Goal: Knowledge of General Education information will improve Description: Including pain rating scale, medication(s)/side effects and non-pharmacologic comfort measures 11/27/2020 1137 by Camillia Herter, RN Outcome: Adequate for Discharge 11/27/2020 0742 by Camillia Herter, RN Outcome: Progressing   Problem: Health Behavior/Discharge Planning: Goal: Ability to manage health-related needs will improve 11/27/2020 1137 by Camillia Herter, RN Outcome: Adequate for Discharge 11/27/2020 0742 by Camillia Herter, RN Outcome: Progressing   Problem: Clinical Measurements: Goal: Ability to maintain clinical measurements within normal limits will improve 11/27/2020 1137 by Camillia Herter, RN Outcome: Adequate for Discharge 11/27/2020 0742 by Camillia Herter, RN Outcome: Progressing Goal: Will remain free from infection 11/27/2020 1137 by Camillia Herter, RN Outcome: Adequate for Discharge 11/27/2020 0742 by Camillia Herter, RN Outcome: Progressing Goal: Diagnostic test results will improve 11/27/2020 1137 by Camillia Herter, RN Outcome: Adequate for Discharge 11/27/2020 0742 by Camillia Herter, RN Outcome: Progressing Goal: Respiratory complications will improve 11/27/2020 1137 by Camillia Herter, RN Outcome: Adequate for Discharge 11/27/2020 0742 by Camillia Herter, RN Outcome: Progressing Goal: Cardiovascular complication will be avoided 11/27/2020 1137 by Camillia Herter, RN Outcome: Adequate for Discharge 11/27/2020 0742 by Camillia Herter, RN Outcome: Progressing   Problem: Activity: Goal: Risk for activity intolerance will decrease 11/27/2020 1137 by Camillia Herter, RN Outcome: Adequate for Discharge 11/27/2020 [phone] by Camillia Herter, RN Outcome: Progressing   Problem: Nutrition: Goal: Adequate nutrition will be maintained 11/27/2020 1137 by Camillia Herter, RN Outcome: Adequate for Discharge 11/27/2020 0742 by Camillia Herter, RN Outcome: Progressing   Problem: Coping: Goal: Level of anxiety  will decrease 11/27/2020 1137 by Camillia Herter, RN Outcome: Adequate for Discharge 11/27/2020 0742 by Camillia Herter, RN Outcome: Progressing   Problem: Elimination: Goal: Will not experience complications related to bowel motility 11/27/2020 1137 by Camillia Herter, RN Outcome: Adequate for Discharge 11/27/2020 0742 by Camillia Herter, RN Outcome: Progressing Goal: Will not experience complications related to urinary retention 11/27/2020 1137 by Camillia Herter, RN Outcome: Adequate for Discharge 11/27/2020 0742 by Camillia Herter, RN Outcome: Progressing   Problem: Pain Managment: Goal: General experience of comfort will improve 11/27/2020 1137 by Camillia Herter, RN Outcome: Adequate for Discharge 11/27/2020 0742 by Camillia Herter, RN Outcome: Progressing   Problem: Safety: Goal: Ability to remain free from injury will improve 11/27/2020 1137 by Camillia Herter, RN Outcome: Adequate for Discharge 11/27/2020 0742 by Camillia Herter, RN Outcome: Progressing   Problem: Skin Integrity: Goal: Risk for impaired skin integrity will decrease 11/27/2020 1137 by Camillia Herter, RN Outcome: Adequate for Discharge 11/27/2020 0742 by Camillia Herter, RN Outcome: Progressing   Problem: Education: Goal: Ability to demonstrate management of disease process will improve 11/27/2020 1137 by Camillia Herter, RN Outcome: Adequate for Discharge 11/27/2020 0742 by Camillia Herter, RN Outcome: Progressing Goal: Ability to verbalize understanding of medication therapies will improve 11/27/2020 1137 by Camillia Herter, RN Outcome: Adequate for Discharge 11/27/2020 0742 by Camillia Herter, RN Outcome: Progressing Goal: Individualized Educational Video(s) 11/27/2020 1137 by Camillia Herter, RN Outcome: Adequate for Discharge 11/27/2020 0742 by Camillia Herter, RN Outcome: Progressing   Problem: Activity: Goal: Capacity to carry out activities will improve 11/27/2020 1137 by Camillia Herter, RN Outcome: Adequate for  Discharge 11/27/2020 7672 by Camillia Herter, RN Outcome: Progressing   Problem: Cardiac: Goal: Ability to achieve and maintain adequate cardiopulmonary perfusion  will improve 11/27/2020 1137 by Camillia Herter, RN Outcome: Adequate for Discharge 11/27/[phone] by Camillia Herter, RN Outcome: Progressing

## 2020-11-28 ENCOUNTER — Telehealth (HOSPITAL_COMMUNITY): Payer: Self-pay

## 2020-11-28 NOTE — Telephone Encounter (Signed)
Cardiac rehab referral for Ph.II faxed to [place]. 

## 2020-12-07 ENCOUNTER — Other Ambulatory Visit: Payer: Self-pay | Admitting: Cardiology

## 2020-12-11 ENCOUNTER — Encounter: Payer: Self-pay | Admitting: Physician Assistant

## 2020-12-11 NOTE — Progress Notes (Signed)
Cardiology Office Note    Date:  12/12/2020   ID:  Kenneth [name], DOB 18-Feb-1941, MRN [id number]  PCP:  [name], MD  Cardiologist:  Carlyle Dolly, MD  Electrophysiologist:  None   Chief Complaint: f/u PCI  History of Present Illness:   Kenneth [name] is a 80 y.o. 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 Last 2D echo 05/2020 EF 55%, + WMA as below, grade 2 DD, mildly reduced RVF, mildly dilated LA. He was recently admitted 11/2020 with chest pain and NSTEMI with low-level troponin elevation. He was treated with IVF for AKI. He underwent cath 11/26/20 showed In-stent restenosis of mid RCA stent, patent sequential LIMA and RIMA grafts, and severe native CAD with known high grade stenosis of LM, proximal and mid LAD, and proximal Cx. Patient was treated with balloon angioplasty for mid RCA in-stent restenosis. Recommended to continue DAPT indefinitely. Metoprolol 25mg  BID decreased to 25mg  daily for bradycardia. Has history of intolerance of multiple statins so has been maintained on Zetia/Praluent. He was referred to [facility] in [place].  He is seen back today with his wife overall stable from cardiac standpoint. He is able to speak with hoarse whispered voice through trache. He has rare fleeting chest pressure but no recurrent angina like the symptoms that brought him in. He has chronic exertional dyspnea which he states is longstanding. Wife says they were told this was because of his tracheostomy. He has followed with ENT and pulmonology for this. He has noticed some rare episodic dizziness when turning his head to the right. We were not able to reproduce this symptom  in clinic today. His HR is 49-51 by intake VS but HR does not change with this maneuver.    Labwork independently reviewed: 11/2020 LDL 44, trig 202, K 4.5, Cr 1.58, Hgb 12.6, LFTs ok  Past Medical History:  Diagnosis Date  . Acute ST elevation myocardial infarction (STEMI) of inferior wall [facility]) 03/01/2020   [facility]  . Anxiety   . 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 Carotid artery disease ([place])    a. h/o R carotid stent.  . Chronic lower back pain    "have it at night" (05/04/2018)  . CKD (chronic kidney disease), stage III ([place])   . Coronary artery disease    s/p CABG 2003, inferior STEMI 02/2020 s/p DES to RCA, NSTEMI 06/05/20 with DES to RCA, NSTEMI 11/2020 s/p PTCA to [facility]  . CVA (cerebral vascular accident) ([place]) 10/2017   "little numb on my left face since" (05/04/2018)  . Depression   . GERD (gastroesophageal reflux disease)   . High triglycerides   . History of kidney stones   . Hypertension   . 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 Malodorous urine    "in the last 5wks; since I had trach put in" (05/04/2018)  . Peripheral vascular disease ([place])   . Sinus bradycardia   . Stroke [facility])    "he's had several little strokes; many that he wasn't aware of" (05/04/2018)  . Tobacco abuse   . Tracheal stenosis     Past Surgical History:  Procedure Laterality Date  . CAROTID STENT Right 06-13-12; 05/04/2018  .  CAROTID STENT INSERTION N/A 06/13/2012   Procedure: CAROTID STENT INSERTION;  Surgeon: Serafina Mitchell, MD;  Location: [facility] CATH LAB;  Service: Cardiovascular;  Laterality: N/A;  . CATARACT EXTRACTION, BILATERAL Bilateral   . COLONOSCOPY  11/10/2011   Dr. [name]: hemorrhoids, tubular adenoma  . CORNEAL TRANSPLANT Bilateral   . CORONARY ARTERY BYPASS GRAFT  ~ 2003   "CABG X3"  . CORONARY BALLOON ANGIOPLASTY N/A 11/26/2020   Procedure: CORONARY BALLOON ANGIOPLASTY;  Surgeon: Troy Sine, MD;  Location: [place] CV LAB;  Service: Cardiovascular;   Laterality: N/A;  . CORONARY STENT INTERVENTION N/A 06/05/2020   Procedure: CORONARY STENT INTERVENTION;  Surgeon: Lorretta Harp, MD;  Location: [place] CV LAB;  Service: Cardiovascular;  Laterality: N/A;  . EYE SURGERY    . INSERTION OF RETROGRADE CAROTID STENT Right 05/04/2018   Procedure: INSERTION OF RIGHT  CAROTID STENT using an ABBOT- XACT carotid stent system;  Surgeon: Serafina Mitchell, MD;  Location: [place];  Service: Vascular;  Laterality: Right;  . LAPAROSCOPIC CHOLECYSTECTOMY  2007  . LEFT HEART CATH AND CORS/GRAFTS ANGIOGRAPHY N/A 10/07/2016   Procedure: Left Heart Cath and Cors/Grafts Angiography;  Surgeon: Troy Sine, MD;  Location: [facility] CV LAB;  Service: Cardiovascular;  Laterality: N/A;  . LEFT HEART CATH AND CORS/GRAFTS ANGIOGRAPHY N/A 06/05/2020   Procedure: LEFT HEART CATH AND CORS/GRAFTS ANGIOGRAPHY;  Surgeon: Lorretta Harp, MD;  Location: [place] CV LAB;  Service: Cardiovascular;  Laterality: N/A;  . LEFT HEART CATH AND CORS/GRAFTS ANGIOGRAPHY N/A 11/26/2020   Procedure: LEFT HEART CATH AND CORS/GRAFTS ANGIOGRAPHY;  Surgeon: Troy Sine, MD;  Location: [facility] CV LAB;  Service: Cardiovascular;  Laterality: N/A;  . PARTIAL LARYNGECTOMY  1998  . TRACHEOSTOMY  03/13/2018   "@ [facility]"    Current Medications: Current Meds  Medication Sig  . acetaminophen (TYLENOL) 500 MG tablet Take 500 mg by mouth every 6 (six) hours as needed.  Marland Kitchen albuterol (PROVENTIL) (2.5 MG/3ML) 0.083% nebulizer solution Take 3 mLs (2.5 mg total) by nebulization 3 (three) times daily. X 5 days and then q 6 hrs prn after that (Patient taking differently: Take 2.5 mg by nebulization 3 (three) times daily. X 5 days and then every 6 hrs as needed after that)  . Alirocumab (PRALUENT) 75 MG/ML SOAJ Inject 1 pen into the skin every 14 (fourteen) days.  Marland Kitchen amLODipine (NORVASC) 10 MG tablet Take 1 tablet by mouth once daily  . aspirin EC 81 MG tablet Take 81 mg by mouth daily.  Swallow whole.  . clopidogrel (PLAVIX) 75 MG tablet Take 1 tablet by mouth once daily  . doxylamine, Sleep, (UNISOM) 25 MG tablet Take 25 mg by mouth at bedtime as needed.  . ezetimibe (ZETIA) 10 MG tablet Take 1 tablet by mouth once daily  . losartan (COZAAR) 50 MG tablet Take 50 mg by mouth daily.  . nitroGLYCERIN (NITROSTAT) 0.4 MG SL tablet Place 1 tablet (0.4 mg total) under the tongue every 5 (five) minutes as needed for chest pain.  . pantoprazole (PROTONIX) 40 MG tablet Take 1 tablet (40 mg total) by mouth daily.  Marland Kitchen PARoxetine (PAXIL) 20 MG tablet Take 20 mg by mouth at bedtime.  Marland Kitchen spironolactone (ALDACTONE) 25 MG tablet Take 25 mg by mouth daily.  . metoprolol succinate (TOPROL-XL) 25 MG 24 hr tablet Take 1 tablet (25 mg total) by mouth daily.     Allergies:   Nifedipine, Lorazepam, Other, Penicillins, Sulfacetamide, Sulfonamide derivatives, Erythromycin,  Statins, and Vancomycin   Social History   Socioeconomic History  . Marital status: Married    Spouse name: Not on file  . Number of children: Not on file  . Years of education: Not on file  . Highest education level: Not on file  Occupational History  . Occupation: retired    Comment: Warden/ranger  Tobacco Use  . Smoking status: Former Smoker    Packs/day: 1.00    Years: 15.00    Pack years: 15.00    Types: Cigarettes    Start date: 04/22/1953    Quit date: 07/26/1970    Years since quitting: 50.4  . Smokeless tobacco: Former Systems developer    Types: Chew    Quit date: 05/05/2002  Vaping Use  . Vaping Use: Never used  Substance and Sexual Activity  . Alcohol use: Never    Alcohol/week: 0.0 standard drinks  . Drug use: Never  . Sexual activity: Not Currently  Other Topics Concern  . Not on file  Social History Narrative   Walks on the treadmill every other day at the [facility] for the last couple of weeks.         Social Determinants of Health   Financial Resource Strain: Not on file  Food Insecurity: Not on file   Transportation Needs: Not on file  Physical Activity: Not on file  Stress: Not on file  Social Connections: Not on file     Family History:  The patient's family history includes Cancer in his brother and brother; Dementia in his mother; Heart disease in his brother and father; Hyperlipidemia in his brother. There is no history of Colon cancer.  ROS:   Please see the history of present illness.  All other systems are reviewed and otherwise negative.    EKGs/Labs/Other Studies Reviewed:    Studies reviewed are outlined and summarized above. Reports included below if pertinent.  Cath 11/2020  Previously placed Prox RCA stent (unknown type) is widely patent.  Ost RCA lesion is 30% stenosed.  Mid RCA lesion is 95% stenosed.  Ost LM to Mid LM lesion is 80% stenosed.  Ost Cx to Prox Cx lesion is 95% stenosed.  Post intervention, there is a 0% residual stenosis.  Ost LAD to Prox LAD lesion is 80% stenosed.  Prox LAD lesion is 90% stenosed.  2nd Diag lesion is 80% stenosed.  RV Branch lesion is 80% stenosed.   Severe native CAD with previously noted high-grade stenoses in the left main, proximal and mid LAD,and proximal circumflex.   Mild 30% proximal narrowing in the RCA prior to the proximal patent  stent but evidence for 95 to 99% in-stent restenosis in the mid RCA stent.  Patent sequential LIMA graft supplying the diagonal 1 and mid LAD.  Patent RIMA graft supplying the OM vessel.  Successful PCI to the mid RCA stent utilizing Wolverine cutting balloon 2.0 x 10 mm, 3.0 x 15 mm and ultimate 3.25 x 15 mm noncompliant balloon dilatations with the 95% stenosis being reduced to 0%.  LVEDP 21 mm Hg.  RECOMMENDATION: Continue DAPT indefinitely.  Medical therapy for concomitant CAD.  Continue aggressive lipid-lowering therapy with target LDL less than 70 and optimal blood pressure control.  Echo 05/2020 1. Left ventricular ejection fraction, by estimation, is 55%.  The left  ventricle demonstrates regional wall motion abnormalities with basal  inferior and basal inferoseptal akinesis. Left ventricular diastolic  parameters are consistent with Grade II  diastolic dysfunction (pseudonormalization). LV systolic function is  somewhat improved compared to echo yesterday.  2. Right ventricular systolic function is mildly reduced. The right  ventricular size is mildly enlarged.  3. Left atrial size was mildly dilated.  4. The mitral valve is normal in structure. Trivial mitral valve  regurgitation. No evidence of mitral stenosis.  5. The aortic valve is tricuspid. Aortic valve regurgitation is not  visualized. Mild aortic valve sclerosis is present, with no evidence of  aortic valve stenosis.  6. Limited echo     EKG:  EKG is ordered today, personally reviewed, demonstrating SB 49bpm, RBBB, prior inferior infarct, diffuse TWI inferiorly and V3-V6 - no sig change from prior   Recent Labs: 11/24/2020: ALT 14 11/27/2020: BUN 22; Creatinine, Ser 1.58; Hemoglobin 12.6; Platelets 214; Potassium 4.5; Sodium 136  Recent Lipid Panel    Component Value Date/Time   CHOL 115 11/27/2020 0418   CHOL 110 09/26/2020 0822   TRIG 202 (H) 11/27/2020 0418   HDL 31 (L) 11/27/2020 0418   HDL 34 (L) 09/26/2020 0822   CHOLHDL 3.7 11/27/2020 0418   VLDL 40 11/27/2020 0418   LDLCALC 44 11/27/2020 0418   LDLCALC 44 09/26/2020 0822    PHYSICAL EXAM:    VS:  BP 120/62   Pulse (!) 51   Ht 5\' 6"  (1.676 m)   Wt 183 lb (83 kg)   SpO2 95%   BMI 29.54 kg/m   BMI: Body mass index is 29.54 kg/m.  GEN: Well nourished, well developed male in no acute distress HEENT: normocephalic, tracheostomy in place - able to speak with hoarse whispered voice Neck: no JVD, carotid bruits, or masses Cardiac: RRR; no murmurs, rubs, or gallops, no edema  Respiratory:  clear to auscultation bilaterally, normal work of breathing GI: soft, nontender, nondistended, + BS MS: no deformity or  atrophy Skin: warm and dry, no rash, right groin cath site without hematoma, ecchymosis, or bruit. Neuro:  Alert and Oriented x 3, Strength and sensation are intact, follows commands Psych: euthymic mood, full affect  Wt Readings from Last 3 Encounters:  12/12/20 183 lb (83 kg)  11/27/20 178 lb 9.6 oz (81 kg)  09/16/20 182 lb (82.6 kg)     ASSESSMENT & PLAN:   1. CAD s/p prior CABG, PCIs - appears clinically stable post PCI. He has not had any recurrent angina like what brought him in. His chronic DOE persists and he follows with pulmonology/ENT for his pulmonary/trache history. Continue DAPT indefinitely per cath note. See below regarding BB. Continue Zetia and Praluent. From cardiac standpoint I think he is ready for cardiac rehab but if he decides he wants to pursue this would also recommend they reach out to the team managing his trache for their recommendations.  2. Sinus bradycardia, also with some dizziness when turning head to the right - HR noted to be 49-51 in clinic. They confirm they reduced Toprol to once daily. Will further reduce to 1/2 tablet (12.5mg ) daily. They follow his VS at home and I told them to call if Hr remains 51bpm or less at which time we'd have to stop his Toprol altogether. He is describing some episodic dizziness when he turns his head to the right. It does not feel like room spinning, just an odd woozy feeling. It is not reliably reproducible. We discussed obtaining a monitor to ensure HR stable during these episodes (I.e. in case of something like carotid barohypersensitivity). He does not think it happens often enough for a 3-7 day monitor so we  will obtain a 14 day Zio. Will also get TSH today. He also appears to be overdue for vascular f/u so I encouraged them to call [facility] today to schedule that visit. Phone number was provided.  3. Essential HTN - BP controlled. Follow with reduction of BB.  4. Hyperlipidemia - last LDL reviewed at goal. Continue Praluent and  Zetia.  5. CKD stage IIIb - recheck BMET today to ensure stability.  Disposition: F/u with Dr. [name] as scheduled August 2022.  Medication Adjustments/Labs and Tests Ordered: Current medicines are reviewed at length with the patient today.  Concerns regarding medicines are outlined above. Medication changes, Labs and Tests ordered today are summarized above and listed in the Patient Instructions accessible in Encounters.    Signed, Charlie Pitter, PA-C  12/12/2020 2:22 PM    [facility] Location in [place], [place] [address] Ph: [phone]; Fax [phone]

## 2020-12-12 ENCOUNTER — Ambulatory Visit: Payer: Medicare HMO | Admitting: Physician Assistant

## 2020-12-12 ENCOUNTER — Other Ambulatory Visit: Payer: Self-pay

## 2020-12-12 ENCOUNTER — Other Ambulatory Visit: Payer: Self-pay | Admitting: Physician Assistant

## 2020-12-12 ENCOUNTER — Other Ambulatory Visit (HOSPITAL_COMMUNITY)
Admission: RE | Admit: 2020-12-12 | Discharge: 2020-12-12 | Disposition: A | Discharge status: Home / Self Care | Payer: Medicare HMO | Source: Ambulatory Visit | Attending: Physician Assistant | Admitting: Physician Assistant

## 2020-12-12 ENCOUNTER — Encounter: Payer: Self-pay | Admitting: Physician Assistant

## 2020-12-12 ENCOUNTER — Ambulatory Visit (INDEPENDENT_AMBULATORY_CARE_PROVIDER_SITE_OTHER): Payer: Medicare HMO

## 2020-12-12 VITALS — BP 120/62 | HR 51 | Ht 66.0 in | Wt 183.0 lb

## 2020-12-12 DIAGNOSIS — R001 Bradycardia, unspecified: Secondary | ICD-10-CM | POA: Diagnosis present

## 2020-12-12 DIAGNOSIS — E785 Hyperlipidemia, unspecified: Secondary | ICD-10-CM | POA: Diagnosis not present

## 2020-12-12 DIAGNOSIS — R42 Dizziness and giddiness: Secondary | ICD-10-CM | POA: Diagnosis not present

## 2020-12-12 DIAGNOSIS — N1832 Chronic kidney disease, stage 3b: Secondary | ICD-10-CM

## 2020-12-12 DIAGNOSIS — I10 Essential (primary) hypertension: Secondary | ICD-10-CM | POA: Diagnosis not present

## 2020-12-12 DIAGNOSIS — I2510 Atherosclerotic heart disease of native coronary artery without angina pectoris: Secondary | ICD-10-CM

## 2020-12-12 LAB — BASIC METABOLIC PANEL
Anion gap: 10 (ref 5–15)
BUN: 27 mg/dL — ABNORMAL HIGH (ref 8–23)
CO2: 22 mmol/L (ref 22–32)
Calcium: 8.6 mg/dL — ABNORMAL LOW (ref 8.9–10.3)
Chloride: 104 mmol/L (ref 98–111)
Creatinine, Ser: 2.13 mg/dL — ABNORMAL HIGH (ref 0.61–1.24)
GFR, Estimated: 31 mL/min — ABNORMAL LOW (ref >60)
Glucose, Bld: 91 mg/dL (ref 70–99)
Potassium: 5.3 mmol/L — ABNORMAL HIGH (ref 3.5–5.1)
Sodium: 136 mmol/L (ref 135–145)

## 2020-12-12 LAB — TSH: TSH: 3.01 u[IU]/mL (ref 0.350–4.500)

## 2020-12-12 MED ORDER — METOPROLOL SUCCINATE ER 25 MG PO TB24: 12.5000 mg | ORAL_TABLET | Freq: Every day | ORAL | 3 refills | Status: DC

## 2020-12-12 NOTE — Patient Instructions (Signed)
Medication Instructions:  Your physician has recommended you make the following change in your medication:  DECREASE Toprol- XL to half tablet daily  *If you need a refill on your cardiac medications before your next appointment, please call your pharmacy*   Lab Work: BMET TSH  If you have labs (blood work) drawn today and your tests are completely normal, you will receive your results only by: Marland Kitchen MyChart Message (if you have MyChart) OR . A paper copy in the mail If you have any lab test that is abnormal or we need to change your treatment, we will call you to review the results.   Testing/Procedures: None   Follow-Up: At [facility], you and your health needs are our priority.  As part of our continuing mission to provide you with exceptional heart care, we have created designated Provider Care Teams.  These Care Teams include your primary Cardiologist (physician) and Advanced Practice Providers (APPs -  Physician Assistants and Nurse Practitioners) who all work together to provide you with the care you need, when you need it.  We recommend signing up for the patient portal called "MyChart".  Sign up information is provided on this After Visit Summary.  MyChart is used to connect with patients for Virtual Visits (Telemedicine).  Patients are able to view lab/test results, encounter notes, upcoming appointments, etc.  Non-urgent messages can be sent to your provider as well.   To learn more about what you can do with MyChart, go to [email or web address].    Your next appointment:   Keep follow up with Dr. [name] in August   Other Instructions Call if heart rate remains 51 bpm or less on new medication regimen  [facility]VS phone #: [phone]  ZIO AT  . Call [place] with any questions during wear 24/7: [phone] . Zio patch should be worn for 14 days unless otherwise instructed . The [facility]) will call you 24-48 hrs. after Elwyn Reach is applied, be sure to answer the  phone from a 224 area code.  They may call during wear with important information regarding your heart, so answer any calls from [employer] . Do not shower for 24 hours after Zio is applied (sponge bath is fine, just keep the patch dry) . After that, when showering avoid direct shower stream of water onto Zio patch, pat dry around the patch . Avoid excessive sweating . Push the button when you are feeling any cardiac symptoms and record them in the logbook or on the Zio app . Refer to the removal date listed on the front of the symptom logbook and remove Zio patch according to the instructions in the back of the logbook . Place Zio patch inside transmitter (sticky side facing up, button facing down) and put both the gateway and symptom logbook in the prepaid mailing envelope included in the back of the transmitter.  Place inside mailbox or any USPS mailbox

## 2020-12-15 ENCOUNTER — Telehealth: Payer: Self-pay | Admitting: *Deleted

## 2020-12-15 DIAGNOSIS — Z79899 Other long term (current) drug therapy: Secondary | ICD-10-CM

## 2020-12-15 NOTE — Telephone Encounter (Signed)
Spoke with pt's wife who stated that they can not come today but will come tomorrow for lab work. Spironolactone d/c'd off med list.

## 2020-12-15 NOTE — Telephone Encounter (Signed)
-----   Message from Charlie Pitter, [place] sent at 12/12/2020  5:19 PM EDT ----- Sending to [place] as I wasn't sure who would cover results! TSH normal. Potassium noted to be higher than prior along with creatinine. I called pt/wife - advised that he stop spironolactone altogether, hold losartan x1 day then restart. I also advised to decrease intake of potassium rich foods. Would recommend repeat BMET on Monday, please arrange.

## 2020-12-16 ENCOUNTER — Other Ambulatory Visit (HOSPITAL_COMMUNITY)
Admission: RE | Admit: 2020-12-16 | Discharge: 2020-12-16 | Disposition: A | Discharge status: Home / Self Care | Payer: Medicare HMO | Source: Ambulatory Visit | Attending: Student | Admitting: Student

## 2020-12-16 ENCOUNTER — Other Ambulatory Visit: Payer: Self-pay

## 2020-12-16 DIAGNOSIS — Z79899 Other long term (current) drug therapy: Secondary | ICD-10-CM

## 2020-12-16 LAB — BASIC METABOLIC PANEL
Anion gap: 6 (ref 5–15)
BUN: 20 mg/dL (ref 8–23)
CO2: 27 mmol/L (ref 22–32)
Calcium: 8.8 mg/dL — ABNORMAL LOW (ref 8.9–10.3)
Chloride: 101 mmol/L (ref 98–111)
Creatinine, Ser: 1.77 mg/dL — ABNORMAL HIGH (ref 0.61–1.24)
GFR, Estimated: 38 mL/min — ABNORMAL LOW (ref >60)
Glucose, Bld: 82 mg/dL (ref 70–99)
Potassium: 5.4 mmol/L — ABNORMAL HIGH (ref 3.5–5.1)
Sodium: 134 mmol/L — ABNORMAL LOW (ref 135–145)

## 2020-12-17 ENCOUNTER — Emergency Department (HOSPITAL_COMMUNITY): Payer: Medicare HMO

## 2020-12-17 ENCOUNTER — Observation Stay (HOSPITAL_COMMUNITY): Payer: Medicare HMO

## 2020-12-17 ENCOUNTER — Telehealth: Payer: Self-pay | Admitting: Student

## 2020-12-17 ENCOUNTER — Observation Stay (HOSPITAL_COMMUNITY)
Admission: EM | Admit: 2020-12-17 | Discharge: 2020-12-19 | Disposition: A | Discharge status: Home / Self Care | Payer: Medicare HMO | Attending: Family Medicine | Admitting: Family Medicine

## 2020-12-17 ENCOUNTER — Encounter (HOSPITAL_COMMUNITY): Payer: Self-pay | Admitting: *Deleted

## 2020-12-17 ENCOUNTER — Telehealth: Payer: Self-pay | Admitting: *Deleted

## 2020-12-17 ENCOUNTER — Observation Stay (HOSPITAL_COMMUNITY): Payer: PRIVATE HEALTH INSURANCE

## 2020-12-17 ENCOUNTER — Other Ambulatory Visit: Payer: Self-pay

## 2020-12-17 DIAGNOSIS — N1832 Chronic kidney disease, stage 3b: Secondary | ICD-10-CM | POA: Insufficient documentation

## 2020-12-17 DIAGNOSIS — Z930 Tracheostomy status: Secondary | ICD-10-CM | POA: Diagnosis not present

## 2020-12-17 DIAGNOSIS — Z7982 Long term (current) use of aspirin: Secondary | ICD-10-CM | POA: Diagnosis not present

## 2020-12-17 DIAGNOSIS — E782 Mixed hyperlipidemia: Secondary | ICD-10-CM | POA: Diagnosis present

## 2020-12-17 DIAGNOSIS — J398 Other specified diseases of upper respiratory tract: Secondary | ICD-10-CM | POA: Diagnosis present

## 2020-12-17 DIAGNOSIS — Z79899 Other long term (current) drug therapy: Secondary | ICD-10-CM

## 2020-12-17 DIAGNOSIS — I10 Essential (primary) hypertension: Secondary | ICD-10-CM

## 2020-12-17 DIAGNOSIS — E785 Hyperlipidemia, unspecified: Secondary | ICD-10-CM | POA: Diagnosis present

## 2020-12-17 DIAGNOSIS — K5900 Constipation, unspecified: Secondary | ICD-10-CM | POA: Diagnosis present

## 2020-12-17 DIAGNOSIS — Z951 Presence of aortocoronary bypass graft: Secondary | ICD-10-CM

## 2020-12-17 DIAGNOSIS — Z87891 Personal history of nicotine dependence: Secondary | ICD-10-CM | POA: Insufficient documentation

## 2020-12-17 DIAGNOSIS — I129 Hypertensive chronic kidney disease with stage 1 through stage 4 chronic kidney disease, or unspecified chronic kidney disease: Secondary | ICD-10-CM | POA: Insufficient documentation

## 2020-12-17 DIAGNOSIS — Z7902 Long term (current) use of antithrombotics/antiplatelets: Secondary | ICD-10-CM | POA: Insufficient documentation

## 2020-12-17 DIAGNOSIS — R29898 Other symptoms and signs involving the musculoskeletal system: Secondary | ICD-10-CM | POA: Insufficient documentation

## 2020-12-17 DIAGNOSIS — R251 Tremor, unspecified: Secondary | ICD-10-CM | POA: Diagnosis not present

## 2020-12-17 DIAGNOSIS — M6281 Muscle weakness (generalized): Secondary | ICD-10-CM | POA: Insufficient documentation

## 2020-12-17 DIAGNOSIS — R569 Unspecified convulsions: Secondary | ICD-10-CM | POA: Insufficient documentation

## 2020-12-17 DIAGNOSIS — R401 Stupor: Secondary | ICD-10-CM | POA: Insufficient documentation

## 2020-12-17 DIAGNOSIS — G720 Drug-induced myopathy: Secondary | ICD-10-CM | POA: Diagnosis present

## 2020-12-17 DIAGNOSIS — R2689 Other abnormalities of gait and mobility: Secondary | ICD-10-CM | POA: Diagnosis not present

## 2020-12-17 DIAGNOSIS — Z20822 Contact with and (suspected) exposure to covid-19: Secondary | ICD-10-CM | POA: Insufficient documentation

## 2020-12-17 DIAGNOSIS — I214 Non-ST elevation (NSTEMI) myocardial infarction: Secondary | ICD-10-CM | POA: Diagnosis present

## 2020-12-17 DIAGNOSIS — R400 Somnolence: Secondary | ICD-10-CM | POA: Diagnosis not present

## 2020-12-17 DIAGNOSIS — E875 Hyperkalemia: Secondary | ICD-10-CM

## 2020-12-17 DIAGNOSIS — N183 Chronic kidney disease, stage 3 unspecified: Secondary | ICD-10-CM

## 2020-12-17 DIAGNOSIS — N1831 Chronic kidney disease, stage 3a: Secondary | ICD-10-CM | POA: Diagnosis present

## 2020-12-17 DIAGNOSIS — R001 Bradycardia, unspecified: Secondary | ICD-10-CM | POA: Diagnosis present

## 2020-12-17 DIAGNOSIS — R2990 Unspecified symptoms and signs involving the nervous system: Secondary | ICD-10-CM

## 2020-12-17 DIAGNOSIS — I639 Cerebral infarction, unspecified: Principal | ICD-10-CM | POA: Diagnosis present

## 2020-12-17 DIAGNOSIS — R491 Aphonia: Secondary | ICD-10-CM | POA: Diagnosis not present

## 2020-12-17 DIAGNOSIS — G459 Transient cerebral ischemic attack, unspecified: Secondary | ICD-10-CM | POA: Diagnosis present

## 2020-12-17 DIAGNOSIS — G9340 Encephalopathy, unspecified: Secondary | ICD-10-CM | POA: Diagnosis not present

## 2020-12-17 DIAGNOSIS — I200 Unstable angina: Secondary | ICD-10-CM | POA: Diagnosis present

## 2020-12-17 DIAGNOSIS — Z8673 Personal history of transient ischemic attack (TIA), and cerebral infarction without residual deficits: Secondary | ICD-10-CM | POA: Diagnosis not present

## 2020-12-17 DIAGNOSIS — R0602 Shortness of breath: Secondary | ICD-10-CM | POA: Diagnosis present

## 2020-12-17 DIAGNOSIS — R0609 Other forms of dyspnea: Secondary | ICD-10-CM | POA: Diagnosis present

## 2020-12-17 DIAGNOSIS — R2681 Unsteadiness on feet: Secondary | ICD-10-CM | POA: Diagnosis present

## 2020-12-17 DIAGNOSIS — R0600 Dyspnea, unspecified: Secondary | ICD-10-CM | POA: Diagnosis present

## 2020-12-17 DIAGNOSIS — I2510 Atherosclerotic heart disease of native coronary artery without angina pectoris: Secondary | ICD-10-CM | POA: Insufficient documentation

## 2020-12-17 DIAGNOSIS — K219 Gastro-esophageal reflux disease without esophagitis: Secondary | ICD-10-CM | POA: Diagnosis present

## 2020-12-17 LAB — DIFFERENTIAL
Abs Immature Granulocytes: 0.03 10*3/uL (ref 0.00–0.07)
Basophils Absolute: 0 10*3/uL (ref 0.0–0.1)
Basophils Relative: 1 %
Eosinophils Absolute: 0.2 10*3/uL (ref 0.0–0.5)
Eosinophils Relative: 2 %
Immature Granulocytes: 0 %
Lymphocytes Relative: 25 %
Lymphs Abs: 2.1 10*3/uL (ref 0.7–4.0)
Monocytes Absolute: 0.8 10*3/uL (ref 0.1–1.0)
Monocytes Relative: 9 %
Neutro Abs: 5.3 10*3/uL (ref 1.7–7.7)
Neutrophils Relative %: 63 %

## 2020-12-17 LAB — COMPREHENSIVE METABOLIC PANEL
ALT: 16 U/L (ref 0–44)
AST: 16 U/L (ref 15–41)
Albumin: 4.2 g/dL (ref 3.5–5.0)
Alkaline Phosphatase: 122 U/L (ref 38–126)
Anion gap: 7 (ref 5–15)
BUN: 24 mg/dL — ABNORMAL HIGH (ref 8–23)
CO2: 27 mmol/L (ref 22–32)
Calcium: 9.1 mg/dL (ref 8.9–10.3)
Chloride: 101 mmol/L (ref 98–111)
Creatinine, Ser: 1.93 mg/dL — ABNORMAL HIGH (ref 0.61–1.24)
GFR, Estimated: 34 mL/min — ABNORMAL LOW (ref >60)
Glucose, Bld: 82 mg/dL (ref 70–99)
Potassium: 4.8 mmol/L (ref 3.5–5.1)
Sodium: 135 mmol/L (ref 135–145)
Total Bilirubin: 0.6 mg/dL (ref 0.3–1.2)
Total Protein: 7.7 g/dL (ref 6.5–8.1)

## 2020-12-17 LAB — APTT: aPTT: 25 seconds (ref 24–36)

## 2020-12-17 LAB — CBC
HCT: 40.7 % (ref 39.0–52.0)
Hemoglobin: 13 g/dL (ref 13.0–17.0)
MCH: 30.7 pg (ref 26.0–34.0)
MCHC: 31.9 g/dL (ref 30.0–36.0)
MCV: 96.2 fL (ref 80.0–100.0)
Platelets: 283 10*3/uL (ref 150–400)
RBC: 4.23 MIL/uL (ref 4.22–5.81)
RDW: 13.9 % (ref 11.5–15.5)
WBC: 8.4 10*3/uL (ref 4.0–10.5)
nRBC: 0 % (ref 0.0–0.2)

## 2020-12-17 LAB — CBG MONITORING, ED: Glucose-Capillary: 110 mg/dL — ABNORMAL HIGH (ref 70–99)

## 2020-12-17 LAB — PROTIME-INR
INR: 1 (ref 0.8–1.2)
Prothrombin Time: 13.3 seconds (ref 11.4–15.2)

## 2020-12-17 MED ORDER — SODIUM CHLORIDE 0.9 % IV SOLN: INTRAVENOUS | Status: DC

## 2020-12-17 MED ORDER — ASPIRIN EC 325 MG PO TBEC
325.0000 mg | DELAYED_RELEASE_TABLET | Freq: Every day | ORAL | Status: DC
  Administered 2020-12-18 – 2020-12-19 (×2): 325 mg via ORAL
  Filled 2020-12-17 (×2): qty 1

## 2020-12-17 MED ORDER — HEPARIN SODIUM (PORCINE) 5000 UNIT/ML IJ SOLN
5000.0000 [IU] | Freq: Three times a day (TID) | INTRAMUSCULAR | Status: DC
  Administered 2020-12-17 – 2020-12-19 (×4): 5000 [IU] via SUBCUTANEOUS
  Filled 2020-12-17 (×3): qty 1

## 2020-12-17 MED ORDER — SODIUM CHLORIDE 0.9 % IV SOLN: 250.0000 mL | INTRAVENOUS | Status: DC

## 2020-12-17 MED ORDER — SENNOSIDES-DOCUSATE SODIUM 8.6-50 MG PO TABS
1.0000 | ORAL_TABLET | Freq: Every evening | ORAL | Status: DC | PRN
  Administered 2020-12-18 (×2): 1 via ORAL
  Filled 2020-12-17 (×2): qty 1

## 2020-12-17 MED ORDER — AMLODIPINE BESYLATE 5 MG PO TABS: 10.0000 mg | ORAL_TABLET | Freq: Every day | ORAL | Status: DC

## 2020-12-17 MED ORDER — STROKE: EARLY STAGES OF RECOVERY BOOK
Freq: Once | Status: AC
  Filled 2020-12-17: qty 1

## 2020-12-17 MED ORDER — CLOPIDOGREL BISULFATE 75 MG PO TABS
75.0000 mg | ORAL_TABLET | Freq: Every day | ORAL | Status: DC
  Administered 2020-12-18 – 2020-12-19 (×2): 75 mg via ORAL
  Filled 2020-12-17 (×2): qty 1

## 2020-12-17 MED ORDER — PAROXETINE HCL 20 MG PO TABS
20.0000 mg | ORAL_TABLET | Freq: Every day | ORAL | Status: DC
  Administered 2020-12-17 – 2020-12-18 (×2): 20 mg via ORAL
  Filled 2020-12-17 (×2): qty 1

## 2020-12-17 MED ORDER — EZETIMIBE 10 MG PO TABS
10.0000 mg | ORAL_TABLET | Freq: Every day | ORAL | Status: DC
  Administered 2020-12-18 – 2020-12-19 (×2): 10 mg via ORAL
  Filled 2020-12-17 (×2): qty 1

## 2020-12-17 MED ORDER — ACETAMINOPHEN 325 MG PO TABS: 650.0000 mg | ORAL_TABLET | ORAL | Status: DC | PRN

## 2020-12-17 MED ORDER — ACETAMINOPHEN 160 MG/5ML PO SOLN: 650.0000 mg | ORAL | Status: DC | PRN

## 2020-12-17 MED ORDER — SODIUM CHLORIDE 0.9% FLUSH: 3.0000 mL | Freq: Once | INTRAVENOUS | Status: DC

## 2020-12-17 MED ORDER — PANTOPRAZOLE SODIUM 40 MG PO TBEC
40.0000 mg | DELAYED_RELEASE_TABLET | Freq: Every day | ORAL | Status: DC
  Administered 2020-12-18 – 2020-12-19 (×2): 40 mg via ORAL
  Filled 2020-12-17 (×2): qty 1

## 2020-12-17 MED ORDER — NITROGLYCERIN 0.4 MG SL SUBL: 0.4000 mg | SUBLINGUAL_TABLET | SUBLINGUAL | Status: DC | PRN

## 2020-12-17 MED ORDER — ACETAMINOPHEN 650 MG RE SUPP: 650.0000 mg | RECTAL | Status: DC | PRN

## 2020-12-17 NOTE — Consult Note (Signed)
NEUROLOGY TELECONSULTATION NOTE   Date of service: Dec 17, 2020 Patient Name: Kenneth [name] MRN:  [id number] DOB:  1941/02/26 Reason for consult: telestroke acute onset dizziness and gait instability  Requesting Provider: Dr. Almyra Free Consult Participants: Bedside RN Roselyn Reef, patient, son, myself Location of the provider: [facility] Location of the patient: [place] [facility]  This consult was provided via telemedicine with 2-way video and audio communication. The patient/family was informed that care would be provided in this way and agreed to receive care in this manner.   _ _ _   _ __   _ __ _ _  __ __   _ __   __ _  History of Present Illness   Kenneth [name] is a 80 y.o. male with medical history significant for CAD s/p CABG, recent NSTEMI s/p stent placement, currently has 26 day Zio in place, hyperlipidemia (statin intolerance), multiple prior TIA and strokes, stage 3b CKD, carotid artery disease history of right carotid stent,  History of kidney stones, history of laryngeal carcinoma now with chronic tracheostomy, depression, GERD, sinus bradycardia, PVD and other medical history detailed below.  LKW 11:20 after which he became acutely dizzy with gait difficulty, markedly improved by time of my video evaluation.   NIHSS = 2 for L eye no blink to threat and L side sensory impairment Able to ambulate in exam room without dizziness  tPA not administered 2/2 low stroke scale and rapidly resolving sx. CTA not performed 2/2 exam not c/w LVO.  CTH:  There is no acute intracranial hemorrhage or evidence of definite acute infarction. ASPECT score is 10.  Questionable new small left cerebellar infarct.  Chronic right frontoparietal cortical infarct. Moderate chronic microvascular ischemic changes.  CNS imaging reviewed; I agree with above interpretation.   Patient to be admitted for stroke/TIA w/u.    ROS   10 point review of systems was  performed and was negative except as described in HPI.  Past History   Past Medical History:  Diagnosis Date  . Acute ST elevation myocardial infarction (STEMI) of inferior wall [facility]) 03/01/2020   [facility]  . Anxiety   . 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 Carotid artery disease ([place])    a. h/o R carotid stent.  . Chronic lower back pain    "have it at night" (05/04/2018)  . CKD (chronic kidney disease), stage III ([place])   . Coronary artery disease    s/p CABG 2003, inferior STEMI 02/2020 s/p DES to RCA, NSTEMI 06/05/20 with DES to RCA, NSTEMI 11/2020 s/p PTCA to [facility]  . CVA (cerebral vascular accident) ([place]) 10/2017   "little numb on my left face since" (05/04/2018)  . Depression   . GERD (gastroesophageal reflux disease)   . High triglycerides   . History of kidney stones   . Hypertension   . Laryngeal carcinoma ([place]) 1998  . Peripheral vascular disease ([place])   . Sinus bradycardia   . Stroke [facility])    "he's had several little strokes; many that he wasn't aware of" (05/04/2018)  . Tobacco abuse   . Tracheal stenosis    Past Surgical History:  Procedure Laterality Date  . CAROTID STENT Right 06-13-12; 05/04/2018  . CAROTID STENT INSERTION N/A 06/13/2012   Procedure: CAROTID STENT INSERTION;  Surgeon: Serafina Mitchell, MD;  Location: [facility] CATH LAB;  Service: Cardiovascular;  Laterality: N/A;  . CATARACT EXTRACTION, BILATERAL Bilateral   . COLONOSCOPY  11/10/2011  Dr. [name]: hemorrhoids, tubular adenoma  . CORNEAL TRANSPLANT Bilateral   . CORONARY ARTERY BYPASS GRAFT  ~ 2003   "CABG X3"  . CORONARY BALLOON ANGIOPLASTY N/A 11/26/2020   Procedure: CORONARY BALLOON ANGIOPLASTY;  Surgeon: Troy Sine, MD;  Location: [place] CV LAB;  Service: Cardiovascular;  Laterality: N/A;  . CORONARY STENT INTERVENTION N/A 06/05/2020   Procedure: CORONARY STENT INTERVENTION;  Surgeon: Lorretta Harp, MD;  Location: [place] CV LAB;  Service: Cardiovascular;   Laterality: N/A;  . EYE SURGERY    . INSERTION OF RETROGRADE CAROTID STENT Right 05/04/2018   Procedure: INSERTION OF RIGHT  CAROTID STENT using an ABBOT- XACT carotid stent system;  Surgeon: Serafina Mitchell, MD;  Location: [place];  Service: Vascular;  Laterality: Right;  . LAPAROSCOPIC CHOLECYSTECTOMY  2007  . LEFT HEART CATH AND CORS/GRAFTS ANGIOGRAPHY N/A 10/07/2016   Procedure: Left Heart Cath and Cors/Grafts Angiography;  Surgeon: Troy Sine, MD;  Location: [place] CV LAB;  Service: Cardiovascular;  Laterality: N/A;  . LEFT HEART CATH AND CORS/GRAFTS ANGIOGRAPHY N/A 06/05/2020   Procedure: LEFT HEART CATH AND CORS/GRAFTS ANGIOGRAPHY;  Surgeon: Lorretta Harp, MD;  Location: [place] CV LAB;  Service: Cardiovascular;  Laterality: N/A;  . LEFT HEART CATH AND CORS/GRAFTS ANGIOGRAPHY N/A 11/26/2020   Procedure: LEFT HEART CATH AND CORS/GRAFTS ANGIOGRAPHY;  Surgeon: Troy Sine, MD;  Location: [place] CV LAB;  Service: Cardiovascular;  Laterality: N/A;  . PARTIAL LARYNGECTOMY  1998  . TRACHEOSTOMY  03/13/2018   "@ [facility]"   Family History  Problem Relation Age of Onset  . Dementia Mother   . Heart disease Father   . Cancer Brother   . Heart disease Brother   . Hyperlipidemia Brother   . Cancer Brother   . Colon cancer Neg Hx    Social History   Socioeconomic History  . Marital status: Married    Spouse name: Not on file  . Number of children: Not on file  . Years of education: Not on file  . Highest education level: Not on file  Occupational History  . Occupation: retired    Comment: Warden/ranger  Tobacco Use  . Smoking status: Former Smoker    Packs/day: 1.00    Years: 15.00    Pack years: 15.00    Types: Cigarettes    Start date: 04/22/1953    Quit date: 07/26/1970    Years since quitting: 50.4  . Smokeless tobacco: Former Systems developer    Types: Chew    Quit date: 05/05/2002  Vaping Use  . Vaping Use: Never used  Substance and Sexual Activity  . Alcohol  use: Never    Alcohol/week: 0.0 standard drinks  . Drug use: Never  . Sexual activity: Not Currently  Other Topics Concern  . Not on file  Social History Narrative   Walks on the treadmill every other day at the [facility] for the last couple of weeks.         Social Determinants of Health   Financial Resource Strain: Not on file  Food Insecurity: Not on file  Transportation Needs: Not on file  Physical Activity: Not on file  Stress: Not on file  Social Connections: Not on file   Allergies  Allergen Reactions  . Nifedipine Other (See Comments)  . Lorazepam Other (See Comments)    REACTION: Alters mental status. "Turns into maniac" REACTION: Alters mental status. "Turns into maniac" REACTION: Alters mental status. "Turns into maniac"  .  Other     REACTION: Unknown to patient. States that MD states allergy per medical records  . Penicillins Hives    Has patient had a PCN reaction causing immediate rash, facial/tongue/throat swelling, SOB or lightheadedness with hypotension: Yes Has patient had a PCN reaction causing severe rash involving mucus membranes or skin necrosis: No Has patient had a PCN reaction that required hospitalization No Has patient had a PCN reaction occurring within the last 10 years: No If all of the above answers are "NO", then may proceed with Cephalosporin use.  HAS TOLERATED: cephalexin  . Sulfacetamide     REACTION: Unknown to patient. States that MD states allergy per medical records  . Sulfonamide Derivatives Other (See Comments)    REACTION: Unknown to patient. States that MD states allergy per medical records  . Erythromycin Rash  . Statins Itching and Rash    Rash on his trunk and arms, swelling in his lips  . Vancomycin Itching    Medications   (Not in a hospital admission)    Vitals   Vitals:   12/17/20 1230 12/17/20 1300 12/17/20 1308 12/17/20 1330  BP: (!) 119/53 (!) 132/109  (!) 127/103  Pulse: 79 (!) 56 (!) 54 (!) 55  Resp: 14 (!)  26 15 20   Temp:      TempSrc:      SpO2: 98% 99% 99% 98%  Weight:      Height:         Body mass index is 29.54 kg/m.  Physical Exam   Exam performed over telemedicine with 2-way video and audio communication and with assistance of bedside RN  Physical Exam Gen: A&O x4, NAD Resp: normal WOB CV: extremities appear well-perfused  Neuro: *MS: A&O x4. Follows multi-step commands.  *Speech: nondysarthric, no aphasia, able to name and repeat *CN: PERRL 11mm, EOMI, does not blink to threat on L, sensation intact, smile symmetric, hearing intact to voice *Motor:   Normal bulk.  No tremor, rigidity or bradykinesia. No pronator drift. All extremities appear full-strength and symmetric. *Sensory: Impaired LT LUE and LLE, otherwise intact. Symmetric. No double-simultaneous extinction.  *Coordination:  Finger-to-nose, heel-to-shin, rapid alternating motions were intact. *Reflexes:  UTA 2/2 tele-exam *Gait: ambulated without difficulty during exam which I witnessed on camera and confirmed with bedside RN  NIHSS = 2 for L eye no blink to threat and L side sensory impairment   Premorbid mRS = 3   Labs   CBC:  Recent Labs  Lab 12/17/20 1209  WBC 8.4  NEUTROABS 5.3  HGB 13.0  HCT 40.7  MCV 96.2  PLT 664    Basic Metabolic Panel:  Lab Results  Component Value Date   NA 135 12/17/2020   K 4.8 12/17/2020   CO2 27 12/17/2020   GLUCOSE 82 12/17/2020   BUN 24 (H) 12/17/2020   CREATININE 1.93 (H) 12/17/2020   CALCIUM 9.1 12/17/2020   GFRNONAA 34 (L) 12/17/2020   GFRAA 51 (L) 10/18/2018   Lipid Panel:  Lab Results  Component Value Date   LDLCALC 44 11/27/2020   HgbA1c:  Lab Results  Component Value Date   HGBA1C 6.2 (H) 09/26/2020   Urine Drug Screen: No results found for: LABOPIA, COCAINSCRNUR, LABBENZ, AMPHETMU, THCU, LABBARB  Alcohol Level No results found for: [place] is a 80 y.o. male with medical history significant for CAD s/p  CABG, recent NSTEMI s/p stent placement, currently has 14 day Zio in place, hyperlipidemia (  statin intolerance), multiple prior TIA and strokes, stage 3b CKD, carotid artery disease history of right carotid stent,  History of kidney stones, history of laryngeal carcinoma now with chronic tracheostomy, depression, GERD, sinus bradycardia, PVD p/w acute onset dizziness c/f possible cerebellar stroke, sx improving but not resolved.  Recommendations   - Admit to hospitalist service for stroke w/u - In-house neuro consult when available - Permissive HTN x48 hrs from sx onset or until stroke ruled out by MRI goal BP <220/110. PRN labetalol or hydralazine if BP above these parameters. Avoid oral antihypertensives. - MRI brain wo contrast - MRA H&N - TTE - Check A1c and LDL + add statin per guidelines (clarify statin intolerance listed, consider non-statin alternatives if needed) - continue ASA 81mg  daily + plavix 75mg  (patient already on for cardiac indications incl possible recent cardiac stent) - q4 hr neuro checks - STAT head CT for any change in neuro exam - Tele - PT/OT/SLP - Stroke education - Amb referral to neurology upon discharge  ______________________________________________________________________   Thank you for the opportunity to take part in the care of this patient. If you have any further questions, please contact the neurology consultation attending.  Signed,  Su Monks, MD [facility] [phone]  If 7pm- 7am, please page neurology on call as listed in [place].

## 2020-12-17 NOTE — Telephone Encounter (Signed)
New message   Patient has been admitted to Hospital , please follow his blood work

## 2020-12-17 NOTE — ED Notes (Signed)
Patient back from MRI.

## 2020-12-17 NOTE — H&P (Signed)
History and Physical  [facility]  [name]:[id number] DOB: 10-25-1940 DOA: 12/17/2020  PCP: Kenneth Noble, MD  Patient coming from: Home  Level of care: Telemetry  I have personally briefly reviewed patient's old medical records in [place]  Chief Complaint: acute dizziness   HPI: Kenneth [name] is a 80 y.o. male with medical history significant for CAD s/p CABG, recent NSTEMI s/p stent placement, currently has 6 day Zio in place, hyperlipidemia (statin intolerance), multiple prior TIA and strokes, stage 3b CKD, carotid artery disease history of right carotid stent,  History of kidney stones, history of laryngeal carcinoma now with chronic tracheostomy, depression, GERD, sinus bradycardia, PVD and other medical history detailed below.  He presented to ED with symptoms of acute onset of dizziness, unsteady gait, difficulty ambulating and almost fell down.  This occurred approximately 30 minutes prior to arrival.  He had been feeling well at his baseline prior to the acute symptoms.  Fortunately the symptoms rapidly began to improve.  He had no chest pain, no SOB, no headache, no vision change, no fever or chills.  He denied having palpitations.      ED Course: Pt was afebrile, HR 52, BP 118/75, temp 98.2, R 17, pulse ox 100% on RA, Na 135, creatinine 1.93, Hg 13.0, CT Head questionable new small left cerebellar infarct, Code stroke called, no TPA given, recommendations were to continue aspirin and plavix and admit for a full CVA work up and inpatient neurology consultation.    Review of Systems: Review of Systems  Constitutional: Negative for fever and malaise/fatigue.  HENT: Negative.   Eyes: Negative.   Respiratory: Negative.   Cardiovascular: Negative.   Gastrointestinal: Negative.   Genitourinary: Negative.   Musculoskeletal: Negative.   Skin: Negative.   Neurological: Positive for dizziness, focal weakness and weakness. Negative for seizures and loss of  consciousness.  Endo/Heme/Allergies: Negative.   Psychiatric/Behavioral: Negative.   All other systems reviewed and are negative.   Past Medical History:  Diagnosis Date  . Acute ST elevation myocardial infarction (STEMI) of inferior wall [facility]) 03/01/2020   [facility]  . Anxiety   . 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 Carotid artery disease ([place])    a. h/o R carotid stent.  . Chronic lower back pain    "have it at night" (05/04/2018)  . CKD (chronic kidney disease), stage III ([place])   . Coronary artery disease    s/p CABG 2003, inferior STEMI 02/2020 s/p DES to RCA, NSTEMI 06/05/20 with DES to RCA, NSTEMI 11/2020 s/p PTCA to [facility]  . CVA (cerebral vascular accident) ([place]) 10/2017   "little numb on my left face since" (05/04/2018)  . Depression   . GERD (gastroesophageal reflux disease)   . High triglycerides   . History of kidney stones   . Hypertension   . Laryngeal carcinoma ([place]) 1998  . Peripheral vascular disease ([place])   . Sinus bradycardia   . Stroke [facility])    "he's had several little strokes; many that he wasn't aware of" (05/04/2018)  . Tobacco abuse   . Tracheal stenosis     Past Surgical History:  Procedure Laterality Date  . CAROTID STENT Right 06-13-12; 05/04/2018  . CAROTID STENT INSERTION N/A 06/13/2012   Procedure: CAROTID STENT INSERTION;  Surgeon: Serafina Mitchell, MD;  Location: [facility] CATH LAB;  Service: Cardiovascular;  Laterality: N/A;  . CATARACT EXTRACTION, BILATERAL Bilateral   . COLONOSCOPY  11/10/2011  Dr. [name]: hemorrhoids, tubular adenoma  . CORNEAL TRANSPLANT Bilateral   . CORONARY ARTERY BYPASS GRAFT  ~ 2003   "CABG X3"  . CORONARY BALLOON ANGIOPLASTY N/A 11/26/2020   Procedure: CORONARY BALLOON ANGIOPLASTY;  Surgeon: Troy Sine, MD;  Location: [place] CV LAB;  Service: Cardiovascular;  Laterality: N/A;  . CORONARY STENT INTERVENTION N/A 06/05/2020   Procedure: CORONARY STENT INTERVENTION;  Surgeon: Lorretta Harp, MD;   Location: [place] CV LAB;  Service: Cardiovascular;  Laterality: N/A;  . EYE SURGERY    . INSERTION OF RETROGRADE CAROTID STENT Right 05/04/2018   Procedure: INSERTION OF RIGHT  CAROTID STENT using an ABBOT- XACT carotid stent system;  Surgeon: Serafina Mitchell, MD;  Location: [place];  Service: Vascular;  Laterality: Right;  . LAPAROSCOPIC CHOLECYSTECTOMY  2007  . LEFT HEART CATH AND CORS/GRAFTS ANGIOGRAPHY N/A 10/07/2016   Procedure: Left Heart Cath and Cors/Grafts Angiography;  Surgeon: Troy Sine, MD;  Location: [place] CV LAB;  Service: Cardiovascular;  Laterality: N/A;  . LEFT HEART CATH AND CORS/GRAFTS ANGIOGRAPHY N/A 06/05/2020   Procedure: LEFT HEART CATH AND CORS/GRAFTS ANGIOGRAPHY;  Surgeon: Lorretta Harp, MD;  Location: [place] CV LAB;  Service: Cardiovascular;  Laterality: N/A;  . LEFT HEART CATH AND CORS/GRAFTS ANGIOGRAPHY N/A 11/26/2020   Procedure: LEFT HEART CATH AND CORS/GRAFTS ANGIOGRAPHY;  Surgeon: Troy Sine, MD;  Location: [place] CV LAB;  Service: Cardiovascular;  Laterality: N/A;  . PARTIAL LARYNGECTOMY  1998  . TRACHEOSTOMY  03/13/2018   "@ [place]"     reports that he quit smoking about 50 years ago. His smoking use included cigarettes. He started smoking about 67 years ago. He has a 15.00 pack-year smoking history. He quit smokeless tobacco use about 18 years ago.  His smokeless tobacco use included chew. He reports that he does not drink alcohol and does not use drugs.  Allergies  Allergen Reactions  . Nifedipine Other (See Comments)  . Lorazepam Other (See Comments)    REACTION: Alters mental status. "Turns into maniac" REACTION: Alters mental status. "Turns into maniac" REACTION: Alters mental status. "Turns into maniac"  . Other     REACTION: Unknown to patient. States that MD states allergy per medical records  . Penicillins Hives    Has patient had a PCN reaction causing immediate rash, facial/tongue/throat swelling, SOB or  lightheadedness with hypotension: Yes Has patient had a PCN reaction causing severe rash involving mucus membranes or skin necrosis: No Has patient had a PCN reaction that required hospitalization No Has patient had a PCN reaction occurring within the last 10 years: No If all of the above answers are "NO", then may proceed with Cephalosporin use.  HAS TOLERATED: cephalexin  . Sulfacetamide     REACTION: Unknown to patient. States that MD states allergy per medical records  . Sulfonamide Derivatives Other (See Comments)    REACTION: Unknown to patient. States that MD states allergy per medical records  . Erythromycin Rash  . Statins Itching and Rash    Rash on his trunk and arms, swelling in his lips  . Vancomycin Itching    Family History  Problem Relation Age of Onset  . Dementia Mother   . Heart disease Father   . Cancer Brother   . Heart disease Brother   . Hyperlipidemia Brother   . Cancer Brother   . Colon cancer Neg Hx     Prior to Admission medications   Medication Sig Start Date  End Date Taking? Authorizing Provider  acetaminophen (TYLENOL) 500 MG tablet Take 500 mg by mouth every 6 (six) hours as needed.   Yes [provider]  Alirocumab (PRALUENT) 75 MG/ML SOAJ Inject 1 pen into the skin every 14 (fourteen) days. 07/04/20  Yes Arnoldo Lenis, MD  amLODipine (NORVASC) 10 MG tablet Take 1 tablet by mouth once daily 05/05/20  Yes Branch, Alphonse Guild, MD  aspirin EC 81 MG tablet Take 325 mg by mouth daily. Swallow whole.   Yes [provider]  clopidogrel (PLAVIX) 75 MG tablet Take 1 tablet by mouth once daily 02/13/20  Yes Branch, Alphonse Guild, MD  ezetimibe (ZETIA) 10 MG tablet Take 1 tablet by mouth once daily 12/08/20  Yes Branch, Alphonse Guild, MD  metoprolol succinate (TOPROL XL) 25 MG 24 hr tablet Take 0.5 tablets (12.5 mg total) by mouth daily. 12/12/20  Yes Dunn, Dayna N, PA-C  pantoprazole (PROTONIX) 40 MG tablet Take 1 tablet (40 mg total) by mouth  daily. 06/07/20  Yes Kathyrn Drown D, NP  PARoxetine (PAXIL) 20 MG tablet Take 20 mg by mouth at bedtime.   Yes [provider]  nitroGLYCERIN (NITROSTAT) 0.4 MG SL tablet Place 1 tablet (0.4 mg total) under the tongue every 5 (five) minutes as needed for chest pain. 01/31/17 05/25/21  Lendon Colonel, NP    Physical Exam: Vitals:   12/17/20 1230 12/17/20 1300 12/17/20 1308 12/17/20 1330  BP: (!) 119/53 (!) 132/109  (!) 127/103  Pulse: 79 (!) 56 (!) 54 (!) 55  Resp: 14 (!) 26 15 20   Temp:      TempSrc:      SpO2: 98% 99% 99% 98%  Weight:      Height:        Constitutional: chronically ill appearing, oriented x 3, awake, alert, NAD, calm, comfortable Eyes: PERRL, lids and conjunctivae normal ENMT: Mucous membranes are moist. Posterior pharynx clear of any exudate or lesions.Normal dentition.  Neck: normal, supple, no masses, no thyromegaly Respiratory: clear to auscultation bilaterally, no wheezing, no crackles. Normal respiratory effort. No accessory muscle use.  Cardiovascular: normal s1, s2 sounds, bradycardic rate, no murmurs / rubs / gallops. No extremity edema. 2+ pedal pulses. No carotid bruits.  Abdomen: no tenderness, no masses palpated. No hepatosplenomegaly. Bowel sounds positive.  Musculoskeletal: no clubbing / cyanosis. No joint deformity upper and lower extremities. Good ROM, no contractures. Normal muscle tone.  Skin: no rashes, lesions, ulcers. No induration Neurologic: CN 2-12 grossly intact. Sensation intact, DTR normal. Strength 5/5 in all 4.  Psychiatric: Normal judgment and insight. Alert and oriented x 3. Normal mood.   Labs on Admission: I have personally reviewed following labs and imaging studies  CBC: Recent Labs  Lab 12/17/20 1209  WBC 8.4  NEUTROABS 5.3  HGB 13.0  HCT 40.7  MCV 96.2  PLT 878   Basic Metabolic Panel: Recent Labs  Lab 12/12/20 1511 12/16/20 1439 12/17/20 1209  NA 136 134* 135  K 5.3* 5.4* 4.8  CL 104 101 101   CO2 22 27 27   GLUCOSE 91 82 82  BUN 27* 20 24*  CREATININE 2.13* 1.77* 1.93*  CALCIUM 8.6* 8.8* 9.1   GFR: Estimated Creatinine Clearance: 30.4 mL/min (A) (by C-G formula based on SCr of 1.93 mg/dL (H)). Liver Function Tests: Recent Labs  Lab 12/17/20 1209  AST 16  ALT 16  ALKPHOS 122  BILITOT 0.6  PROT 7.7  ALBUMIN 4.2   No results for input(s): LIPASE,  AMYLASE in the last 168 hours. No results for input(s): AMMONIA in the last 168 hours. Coagulation Profile: Recent Labs  Lab 12/17/20 1209  INR 1.0   Cardiac Enzymes: No results for input(s): CKTOTAL, CKMB, CKMBINDEX, TROPONINI in the last 168 hours. BNP (last 3 results) No results for input(s): PROBNP in the last 8760 hours. HbA1C: No results for input(s): HGBA1C in the last 72 hours. CBG: Recent Labs  Lab 12/17/20 1238  GLUCAP 110*   Lipid Profile: No results for input(s): CHOL, HDL, LDLCALC, TRIG, CHOLHDL, LDLDIRECT in the last 72 hours. Thyroid Function Tests: No results for input(s): TSH, T4TOTAL, FREET4, T3FREE, THYROIDAB in the last 72 hours. Anemia Panel: No results for input(s): VITAMINB12, FOLATE, FERRITIN, TIBC, IRON, RETICCTPCT in the last 72 hours. Urine analysis:    Component Value Date/Time   COLORURINE YELLOW 10/13/2018 1023   APPEARANCEUR CLEAR 10/13/2018 1023   LABSPEC 1.010 10/13/2018 1023   PHURINE 8.0 10/13/2018 1023   GLUCOSEU NEGATIVE 10/13/2018 1023   GLUCOSEU neg 06/01/2010 1113   HGBUR NEGATIVE 10/13/2018 1023   [place] 10/13/2018 1023   [place] 10/13/2018 1023   PROTEINUR NEGATIVE 10/13/2018 1023   NITRITE NEGATIVE 10/13/2018 1023   LEUKOCYTESUR NEGATIVE 10/13/2018 1023    Radiological Exams on Admission: CT HEAD CODE STROKE WO CONTRAST  Result Date: 12/17/2020 CLINICAL DATA:  Code stroke.  Headache and dizziness EXAM: CT HEAD WITHOUT CONTRAST TECHNIQUE: Contiguous axial images were obtained from the base of the skull through the vertex without  intravenous contrast. COMPARISON:  2019 FINDINGS: Brain: No acute intracranial hemorrhage, mass effect, or edema. No new loss of gray-white differentiation. Chronic cortical infarct of the right frontoparietal lobes in the perirolandic region. Questionable new small left cerebellar infarct. Additional patchy and confluent areas of hypoattenuation in the supratentorial white matter is nonspecific but probably reflects moderate chronic microvascular ischemic changes. Prominence of the ventricles and sulci reflects generalized parenchymal volume loss. No extra-axial fluid collection. Vascular: No hyperdense vessel. There is intracranial atherosclerotic calcification at the skull base Skull: Unremarkable. Sinuses/Orbits: No acute abnormality. Other: Mastoid air cells are clear. ASPECTS ([place] Stroke Program Early CT Score) - Ganglionic level infarction (caudate, lentiform nuclei, internal capsule, insula, M1-M3 cortex): 7 - Supraganglionic infarction (M4-M6 cortex): 3 Total score (0-10 with 10 being normal): 10 IMPRESSION: There is no acute intracranial hemorrhage or evidence of definite acute infarction. ASPECT score is 10. Questionable new small left cerebellar infarct. Chronic right frontoparietal cortical infarct. Moderate chronic microvascular ischemic changes. These results were called by telephone at the time of interpretation on 12/17/2020 at 12:18 pm to provider [facility] , who verbally acknowledged these results. Electronically Signed   By: Macy Mis M.D.   On: 12/17/2020 12:19    EKG: Independently reviewed. Sinus bradycardia   Assessment/Plan Principal Problem:   Acute CVA (cerebrovascular accident) ([place]) Active Problems:   HLD (hyperlipidemia)   GASTROESOPHAGEAL REFLUX DISEASE   DOE (dyspnea on exertion)   Tracheal stenosis   Essential hypertension   Constipation   TIA (transient ischemic attack)   Ischemic stroke (HCC)   SOB (shortness of breath)   Tracheostomy dependent (HCC)    NSTEMI (non-ST elevated myocardial infarction) ([place])   Hx of CABG   Statin myopathy   Unstable angina (HCC)   CKD (chronic kidney disease) stage 3, GFR 30-59 ml/min (HCC)     Question of acute CVA - check MRI brain for further evaluation, continue full stroke work up, pt did not receive tPA, check Echo,  carotids, obtain PT/OT evaluation, bedside swallow eval, check lipid panel and A1c.  Continue aspirin and plavix for now.  He already has a 14 day Zio monitor in place by his cardiologist to evaluate for arrhthymias.   Further recommendations to follow after MRI completed and neurology has seen.    Stage 3b CKD - stable labs, follow.  Renally dose medications as needed.   CAD s/p recent NSTEMI - He has a newly placed stent, he remains on aspirin and plavix.  He has close outpatient follow up with cardiology.    Tracheostomy dependent - continue routine trach care.    GERD - protonix for GI protection.   Essential hypertension - stable, controlled.  Allowing some permissive hypertension.    Sinus bradycardia - temporarily holding metoprolol due to dizziness and near fall at home.    DVT prophylaxis: heparin   Code Status: Full   Family Communication: bedside update   Disposition Plan: anticipate home   Consults called: neurology   Admission status: OBV  Level of care: Telemetry Irwin Brakeman MD [facility] How to contact the [facility] Attending or Consulting provider [place] or covering provider during after hours [place], for this patient?  1. Check the care team in [facility] and look for a) attending/consulting [facility] provider listed and b) the [facility] team listed 2. Log into [email or web address] and use 's universal password to access. If you do not have the password, please contact the hospital operator. 3. Locate the [facility] provider you are looking for under [facility] and page to a number that you can be directly reached. 4. If you still have difficulty reaching the provider,  please page the [facility] (Director on Call) for the Hospitalists listed on amion for assistance.   If 7PM-7AM, please contact night-coverage [email or web address] Password [facility]  12/17/2020, 2:23 PM

## 2020-12-17 NOTE — Progress Notes (Signed)
Telestroke brief note  11:20am LKW  Became dizzy with gait difficulty, now markedly improved  NIHSS = 2 for L eye no blink to threat and L side sensory impairment Able to ambulate in exam room without dizziness  tPA not administered 2/2 low stroke scale and rapidly resolving sx CTA not performed 2/2 exam not c/w LVO  Admit for stroke w/u Continue ASA and plavix  Full note to follow (after I complete another active stroke code)  Su Monks, MD [facility] [phone]  If 7pm- 7am, please page neurology on call as listed in [place].

## 2020-12-17 NOTE — Telephone Encounter (Signed)
Pt and wife notified and voiced understanding.

## 2020-12-17 NOTE — ED Notes (Signed)
Echo at bedside to perform exam.

## 2020-12-17 NOTE — Telephone Encounter (Signed)
Thank you for update. Being admitted for stroke workup. Internal medicine team will be able to manage potassium from here to advise further contingent on hospital course - repeat value was normal.

## 2020-12-17 NOTE — Telephone Encounter (Signed)
-----   Message from Charlie Pitter, [place] sent at 12/17/2020  7:42 AM EDT ----- Note reviewed - thank you [place] for looking at this! Kisha, please make sure patient did in fact hold spironolactone. Since K only went up slightly, I would favor holding losartan altogether for now with recheck BMET as stat Fri AM. Please find out if patient sees nephrology - if so, please call their office with results to advise further on whether he is nearing need for chronic potassium management. If not, please a) place referral for CKD stage III+hyperkalemia and also call please request patient make appt with PCP to be seen within 1 week to further trend this. In the meantime he should be trending his BP and letting us know if it's running greater than 135-140 with these med changes as he may require a substitute med like amlodipine (allergy list says intolerance to nifedipine but does not specify so please find out). Thank you! Dayna

## 2020-12-17 NOTE — ED Notes (Signed)
Pt taken straight to CT by triage RN. Pt's son at bedside in pt's ED room and giving information to tele-neuro RN at this time.

## 2020-12-17 NOTE — ED Notes (Signed)
Patient transported to MRI 

## 2020-12-17 NOTE — ED Triage Notes (Signed)
Pt's son reports pt had MI 3 weeks ago and was at [facility].  Reports had stent placed at that time. Today he was standing with son at 69 and started c/o dizziness.  Says felt room was spinning and he felt like he was going to pass out.  ALso c/o headache.  Pt presently wearing a heart monitor.  Reports pt has been having problems with hyperkalemia as well.  Has been having potassium checked frequently.  Pt reports feels weak all over.  Notified EDP, instructed to activate code stroke.

## 2020-12-17 NOTE — Progress Notes (Signed)
Code Stroke Time Documentation   1201 Call Time 1173 VAPOLI Time 1208 Exam Started  1212 Exam Finished 1030 Images sent to [facility] 1212 Exam completed in [place] radiology called

## 2020-12-17 NOTE — ED Provider Notes (Signed)
[facility] EMERGENCY DEPARTMENT Provider Note   CSN: [id number] Arrival date & time: 12/17/20  1142  An emergency department physician performed an initial assessment on this suspected stroke patient at 1205.  History Chief Complaint  Patient presents with  . Code Stroke    Kenneth [name] is a 80 y.o. male.  Patient presents with unsteady gait.  He states he was fine until about 30 minutes prior to arrival he felt unsteady, had to hold onto things to walk to prevent falling.  Otherwise denies any weakness elsewhere.  Denies fevers or cough or vomiting or diarrhea.  Denies headache or neck pain or back pain.        Past Medical History:  Diagnosis Date  . Acute ST elevation myocardial infarction (STEMI) of inferior wall [facility]) 03/01/2020   [facility]  . Anxiety   . 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 Carotid artery disease ([place])    a. h/o R carotid stent.  . Chronic lower back pain    "have it at night" (05/04/2018)  . CKD (chronic kidney disease), stage III ([place])   . Coronary artery disease    s/p CABG 2003, inferior STEMI 02/2020 s/p DES to RCA, NSTEMI 06/05/20 with DES to RCA, NSTEMI 11/2020 s/p PTCA to [facility]  . CVA (cerebral vascular accident) ([place]) 10/2017   "little numb on my left face since" (05/04/2018)  . Depression   . GERD (gastroesophageal reflux disease)   . High triglycerides   . History of kidney stones   . Hypertension   . 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 Peripheral vascular disease ([place])   . Sinus bradycardia   . Stroke [facility])    "he's had several little strokes; many that he wasn't aware of" (05/04/2018)  . Tobacco abuse   . Tracheal stenosis     Patient Active Problem List   Diagnosis Date Noted  . Acute CVA (cerebrovascular accident) ([place]) 12/17/2020  . CKD (chronic kidney disease) stage 3, GFR 30-59 ml/min (HCC) 11/27/2020  . Unstable angina ([place])   . Elevated troponin   . Statin myopathy 07/04/2020  . Hx of CABG  06/06/2020  . Sore throat 06/06/2020  . NSTEMI (non-ST elevated myocardial infarction) ([place]) 06/04/2020  . Acute on chronic respiratory failure with hypoxia ([place]) 09/06/2018  . Tracheostomy dependent ([place]) 09/06/2018  . SOB (shortness of breath) 09/05/2018  . Carotid stenosis, symptomatic, with infarction ([place]) 05/04/2018  . Ischemic stroke ([place]) 11/15/2017  . TIA (transient ischemic attack) 11/14/2017  . Constipation 09/15/2017  . Chest pain 10/06/2016  . Essential hypertension 10/06/2016  . 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 DOE (dyspnea on exertion) 12/05/2015  . 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 Aftercare following surgery of the circulatory system, [place] 08/03/2013  . Occlusion and stenosis of carotid artery without mention of cerebral infarction 07/21/2012  . Preop cardiovascular exam 05/08/2012  . Carotid stenosis, bilateral 05/05/2012  . CAD S/P percutaneous coronary angioplasty 08/13/2011  . Malignant neoplasm of larynx ([place]) 12/14/2009  . HLD (hyperlipidemia) 12/14/2009  . CEREBROVASCULAR ACCIDENT 12/14/2009  . GASTROESOPHAGEAL REFLUX DISEASE 12/14/2009  . 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 CAROTID STENT Right 06-13-12; 05/04/2018  . CAROTID STENT INSERTION N/A 06/13/2012   Procedure: CAROTID STENT INSERTION;  Surgeon: Serafina Mitchell, MD;  Location: [facility] CATH LAB;  Service: Cardiovascular;  Laterality: N/A;  . CATARACT EXTRACTION, BILATERAL Bilateral   . COLONOSCOPY  11/10/2011   Dr. [name]: hemorrhoids,  tubular adenoma  . CORNEAL TRANSPLANT Bilateral   . CORONARY ARTERY BYPASS GRAFT  ~ 2003   "CABG X3"  . CORONARY BALLOON ANGIOPLASTY N/A 11/26/2020   Procedure: CORONARY BALLOON ANGIOPLASTY;  Surgeon: Troy Sine, MD;  Location: [place] CV LAB;  Service: Cardiovascular;  Laterality: N/A;  . CORONARY STENT INTERVENTION N/A 06/05/2020   Procedure: CORONARY STENT INTERVENTION;  Surgeon: Lorretta Harp, MD;  Location: [place] CV LAB;  Service: Cardiovascular;  Laterality: N/A;  . EYE SURGERY    . INSERTION OF RETROGRADE CAROTID STENT Right 05/04/2018   Procedure: INSERTION OF RIGHT  CAROTID STENT using an ABBOT- XACT carotid stent system;  Surgeon: Serafina Mitchell, MD;  Location: [place];  Service: Vascular;  Laterality: Right;  . LAPAROSCOPIC CHOLECYSTECTOMY  2007  . LEFT HEART CATH AND CORS/GRAFTS ANGIOGRAPHY N/A 10/07/2016   Procedure: Left Heart Cath and Cors/Grafts Angiography;  Surgeon: Troy Sine, MD;  Location: [place] CV LAB;  Service: Cardiovascular;  Laterality: N/A;  . LEFT HEART CATH AND CORS/GRAFTS ANGIOGRAPHY N/A 06/05/2020   Procedure: LEFT HEART CATH AND CORS/GRAFTS ANGIOGRAPHY;  Surgeon: Lorretta Harp, MD;  Location: [place] CV LAB;  Service: Cardiovascular;  Laterality: N/A;  . LEFT HEART CATH AND CORS/GRAFTS ANGIOGRAPHY N/A 11/26/2020   Procedure: LEFT HEART CATH AND CORS/GRAFTS ANGIOGRAPHY;  Surgeon: Troy Sine, MD;  Location: [place] CV LAB;  Service: Cardiovascular;  Laterality: N/A;  . PARTIAL LARYNGECTOMY  1998  . TRACHEOSTOMY  03/13/2018   "@ [facility]"       Family History  Problem Relation Age of Onset  . Dementia Mother   . Heart disease Father   . Cancer Brother   . Heart disease Brother   . Hyperlipidemia Brother   . Cancer Brother   . Colon cancer Neg Hx     Social History   Tobacco Use  . Smoking status: Former Smoker    Packs/day: 1.00    Years: 15.00    Pack years: 15.00    Types: Cigarettes    Start date: 04/22/1953    Quit date: 07/26/1970    Years since quitting: 50.4  . Smokeless tobacco: Former Systems developer    Types: Chew    Quit date: 05/05/2002  Vaping Use  . Vaping Use: Never used  Substance Use Topics  . Alcohol use: Never    Alcohol/week: 0.0 standard drinks  . Drug use: Never    Home Medications Prior to Admission medications   Medication Sig Start Date End Date Taking? Authorizing Provider  acetaminophen (TYLENOL) 500 MG  tablet Take 500 mg by mouth every 6 (six) hours as needed.   Yes [provider]  Alirocumab (PRALUENT) 75 MG/ML SOAJ Inject 1 pen into the skin every 14 (fourteen) days. 07/04/20  Yes Arnoldo Lenis, MD  amLODipine (NORVASC) 10 MG tablet Take 1 tablet by mouth once daily 05/05/20  Yes Branch, Alphonse Guild, MD  aspirin EC 81 MG tablet Take 325 mg by mouth daily. Swallow whole.   Yes [provider]  clopidogrel (PLAVIX) 75 MG tablet Take 1 tablet by mouth once daily 02/13/20  Yes Branch, Alphonse Guild, MD  ezetimibe (ZETIA) 10 MG tablet Take 1 tablet by mouth once daily 12/08/20  Yes Branch, Alphonse Guild, MD  metoprolol succinate (TOPROL XL) 25 MG 24 hr tablet Take 0.5 tablets (12.5 mg total) by mouth daily. 12/12/20  Yes Dunn, Dayna N, PA-C  pantoprazole (PROTONIX) 40 MG tablet Take 1 tablet (  40 mg total) by mouth daily. 06/07/20  Yes Kathyrn Drown D, NP  PARoxetine (PAXIL) 20 MG tablet Take 20 mg by mouth at bedtime.   Yes [provider]  nitroGLYCERIN (NITROSTAT) 0.4 MG SL tablet Place 1 tablet (0.4 mg total) under the tongue every 5 (five) minutes as needed for chest pain. 01/31/17 05/25/21  Lendon Colonel, NP    Allergies    Nifedipine, Lorazepam, Other, Penicillins, Sulfacetamide, Sulfonamide derivatives, Erythromycin, Statins, and Vancomycin  Review of Systems   Review of Systems  Constitutional: Negative for fever.  HENT: Negative for ear pain and sore throat.   Eyes: Negative for pain.  Respiratory: Negative for cough.   Cardiovascular: Negative for chest pain.  Gastrointestinal: Negative for abdominal pain.  Genitourinary: Negative for flank pain.  Musculoskeletal: Negative for back pain.  Skin: Negative for color change and rash.  Neurological: Negative for syncope.  All other systems reviewed and are negative.   Physical Exam Updated Vital Signs BP (!) 127/103   Pulse (!) 55   Temp 98.2 F (36.8 C) (Oral)   Resp 20   Ht 5\' 6"  (1.676 m)   Wt  83 kg   SpO2 98%   BMI 29.54 kg/m   Physical Exam Constitutional:      General: He is not in acute distress.    Appearance: He is well-developed.  HENT:     Head: Normocephalic.     Nose: Nose normal.  Eyes:     Extraocular Movements: Extraocular movements intact.  Cardiovascular:     Rate and Rhythm: Normal rate.  Pulmonary:     Effort: Pulmonary effort is normal.  Skin:    Coloration: Skin is not jaundiced.  Neurological:     Mental Status: He is alert.     Comments: Cranial nerves II through XII appear intact.  Strength is 5/5 all extremities bilaterally.  Patient is able to walk without falling when he gets up but states that his gait feels "off."     ED Results / Procedures / Treatments   Labs (all labs ordered are listed, but only abnormal results are displayed) Labs Reviewed  COMPREHENSIVE METABOLIC PANEL - Abnormal; Notable for the following components:      Result Value   BUN 24 (*)    Creatinine, Ser 1.93 (*)    GFR, Estimated 34 (*)    All other components within normal limits  CBG MONITORING, ED - Abnormal; Notable for the following components:   Glucose-Capillary 110 (*)    All other components within normal limits  PROTIME-INR  APTT  CBC  DIFFERENTIAL  HEMOGLOBIN A1C  CBC  CREATININE, SERUM  I-STAT CHEM 8, ED    EKG None  Radiology CT HEAD CODE STROKE WO CONTRAST  Result Date: 12/17/2020 CLINICAL DATA:  Code stroke.  Headache and dizziness EXAM: CT HEAD WITHOUT CONTRAST TECHNIQUE: Contiguous axial images were obtained from the base of the skull through the vertex without intravenous contrast. COMPARISON:  2019 FINDINGS: Brain: No acute intracranial hemorrhage, mass effect, or edema. No new loss of gray-white differentiation. Chronic cortical infarct of the right frontoparietal lobes in the perirolandic region. Questionable new small left cerebellar infarct. Additional patchy and confluent areas of hypoattenuation in the supratentorial white  matter is nonspecific but probably reflects moderate chronic microvascular ischemic changes. Prominence of the ventricles and sulci reflects generalized parenchymal volume loss. No extra-axial fluid collection. Vascular: No hyperdense vessel. There is intracranial atherosclerotic calcification at the skull base Skull: Unremarkable. Sinuses/Orbits:  No acute abnormality. Other: Mastoid air cells are clear. ASPECTS ([place] Stroke Program Early CT Score) - Ganglionic level infarction (caudate, lentiform nuclei, internal capsule, insula, M1-M3 cortex): 7 - Supraganglionic infarction (M4-M6 cortex): 3 Total score (0-10 with 10 being normal): 10 IMPRESSION: There is no acute intracranial hemorrhage or evidence of definite acute infarction. ASPECT score is 10. Questionable new small left cerebellar infarct. Chronic right frontoparietal cortical infarct. Moderate chronic microvascular ischemic changes. These results were called by telephone at the time of interpretation on 12/17/2020 at 12:18 pm to provider [facility] , who verbally acknowledged these results. Electronically Signed   By: Macy Mis M.D.   On: 12/17/2020 12:19    Procedures Procedures   Medications Ordered in ED Medications  sodium chloride flush (NS) 0.9 % injection 3 mL (3 mLs Intravenous Not Given 12/17/20 1233)  0.9 %  sodium chloride infusion (250 mLs Intravenous Not Given 12/17/20 1440)  aspirin EC tablet 325 mg (has no administration in time range)  amLODipine (NORVASC) tablet 10 mg (has no administration in time range)  ezetimibe (ZETIA) tablet 10 mg (has no administration in time range)  nitroGLYCERIN (NITROSTAT) SL tablet 0.4 mg (has no administration in time range)  PARoxetine (PAXIL) tablet 20 mg (has no administration in time range)  pantoprazole (PROTONIX) EC tablet 40 mg (has no administration in time range)  clopidogrel (PLAVIX) tablet 75 mg (has no administration in time range)   stroke: mapping our early stages of  recovery book (has no administration in time range)  0.9 %  sodium chloride infusion (has no administration in time range)  acetaminophen (TYLENOL) tablet 650 mg (has no administration in time range)    Or  acetaminophen (TYLENOL) 160 MG/5ML solution 650 mg (has no administration in time range)    Or  acetaminophen (TYLENOL) suppository 650 mg (has no administration in time range)  senna-docusate (Senokot-S) tablet 1 tablet (has no administration in time range)  heparin injection 5,000 Units (has no administration in time range)    ED Course  I have reviewed the triage vital signs and the nursing notes.  Pertinent labs & imaging results that were available during my care of the patient were reviewed by me and considered in my medical decision making (see chart for details).    MDM Rules/Calculators/A&P                          Stroke alert was activated upon arrival but patient was not deemed a candidate for tPA given very low NIH score.  Case discussed with neurologist on-call.  CT head unremarkable.  Labs unremarkable as well.  Will be brought to the hospitalist team for MRI imaging and further work-up.    Final Clinical Impression(s) / ED Diagnoses Final diagnoses:  Stroke-like episode    Rx / DC Orders ED Discharge Orders    None       Luna Fuse, MD 12/17/20 [phone]

## 2020-12-18 ENCOUNTER — Observation Stay (HOSPITAL_BASED_OUTPATIENT_CLINIC_OR_DEPARTMENT_OTHER): Payer: Medicare HMO

## 2020-12-18 ENCOUNTER — Observation Stay (HOSPITAL_COMMUNITY)
Admit: 2020-12-18 | Discharge: 2020-12-18 | Disposition: A | Discharge status: Home / Self Care | Payer: Medicare HMO | Attending: Neurology | Admitting: Neurology

## 2020-12-18 ENCOUNTER — Other Ambulatory Visit (HOSPITAL_COMMUNITY): Payer: Self-pay | Admitting: *Deleted

## 2020-12-18 DIAGNOSIS — I214 Non-ST elevation (NSTEMI) myocardial infarction: Secondary | ICD-10-CM | POA: Diagnosis not present

## 2020-12-18 DIAGNOSIS — N1832 Chronic kidney disease, stage 3b: Secondary | ICD-10-CM | POA: Diagnosis not present

## 2020-12-18 DIAGNOSIS — I10 Essential (primary) hypertension: Secondary | ICD-10-CM | POA: Diagnosis not present

## 2020-12-18 DIAGNOSIS — R42 Dizziness and giddiness: Secondary | ICD-10-CM | POA: Diagnosis not present

## 2020-12-18 DIAGNOSIS — I6389 Other cerebral infarction: Secondary | ICD-10-CM | POA: Diagnosis not present

## 2020-12-18 DIAGNOSIS — I639 Cerebral infarction, unspecified: Secondary | ICD-10-CM | POA: Diagnosis not present

## 2020-12-18 DIAGNOSIS — E7800 Pure hypercholesterolemia, unspecified: Secondary | ICD-10-CM | POA: Diagnosis not present

## 2020-12-18 LAB — BASIC METABOLIC PANEL
Anion gap: 6 (ref 5–15)
BUN: 25 mg/dL — ABNORMAL HIGH (ref 8–23)
CO2: 28 mmol/L (ref 22–32)
Calcium: 9.1 mg/dL (ref 8.9–10.3)
Chloride: 103 mmol/L (ref 98–111)
Creatinine, Ser: 1.73 mg/dL — ABNORMAL HIGH (ref 0.61–1.24)
GFR, Estimated: 39 mL/min — ABNORMAL LOW (ref >60)
Glucose, Bld: 111 mg/dL — ABNORMAL HIGH (ref 70–99)
Potassium: 5.3 mmol/L — ABNORMAL HIGH (ref 3.5–5.1)
Sodium: 137 mmol/L (ref 135–145)

## 2020-12-18 LAB — LIPID PANEL
Cholesterol: 88 mg/dL (ref 0–200)
HDL: 33 mg/dL — ABNORMAL LOW (ref >40)
LDL Cholesterol: 14 mg/dL (ref 0–99)
Total CHOL/HDL Ratio: 2.7 RATIO
Triglycerides: 205 mg/dL — ABNORMAL HIGH (ref <150)
VLDL: 41 mg/dL — ABNORMAL HIGH (ref 0–40)

## 2020-12-18 LAB — ECHOCARDIOGRAM COMPLETE
AR max vel: 2.02 cm2
AV Area VTI: 2.09 cm2
AV Area mean vel: 2.09 cm2
AV Mean grad: 7 mmHg
AV Peak grad: 16.7 mmHg
Ao pk vel: 2.05 m/s
Area-P 1/2: 3.03 cm2
Height: 66 in
S' Lateral: 2.6 cm
Weight: 2927.99 oz

## 2020-12-18 LAB — HEMOGLOBIN A1C
Hgb A1c MFr Bld: 6.1 % — ABNORMAL HIGH (ref 4.8–5.6)
Mean Plasma Glucose: 128 mg/dL

## 2020-12-18 LAB — SARS CORONAVIRUS 2 (TAT 6-24 HRS): SARS Coronavirus 2: NEGATIVE

## 2020-12-18 LAB — MAGNESIUM: Magnesium: 2.2 mg/dL (ref 1.7–2.4)

## 2020-12-18 NOTE — Progress Notes (Signed)
EEG completed, results pending. 

## 2020-12-18 NOTE — Evaluation (Signed)
[facility] - Evaluation Patient Details  Name: Kenneth [name] MRN: [id number] Date of Birth: 1941/07/11  Today's Date: 12/18/2020 Time: 9518-8416 SLP Time Calculation (min) (ACUTE ONLY): 22 min  Past Medical History:  Past Medical History:  Diagnosis Date  . Acute ST elevation myocardial infarction (STEMI) of inferior wall [facility]) 03/01/2020   [facility]  . Anxiety   . 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 Carotid artery disease ([place])    a. h/o R carotid stent.  . Chronic lower back pain    "have it at night" (05/04/2018)  . CKD (chronic kidney disease), stage III ([place])   . Coronary artery disease    s/p CABG 2003, inferior STEMI 02/2020 s/p DES to RCA, NSTEMI 06/05/20 with DES to RCA, NSTEMI 11/2020 s/p [facility]CA to [facility]  . CVA (cerebral vascular accident) ([place]) 10/2017   "little numb on my left face since" (05/04/2018)  . Depression   . GERD (gastroesophageal reflux disease)   . High triglycerides   . History of kidney stones   . Hypertension   . 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 Peripheral vascular disease ([place])   . Sinus bradycardia   . Stroke [facility])    "he's had several little strokes; many that he wasn't aware of" (05/04/2018)  . Tobacco abuse   . Tracheal stenosis    Past Surgical History:  Past Surgical History:  Procedure Laterality Date  . CAROTID STENT Right 06-13-12; 05/04/2018  . CAROTID STENT INSERTION N/A 06/13/2012   Procedure: CAROTID STENT INSERTION;  Surgeon: Serafina Mitchell, MD;  Location: [facility] CATH LAB;  Service: Cardiovascular;  Laterality: N/A;  . CATARACT EXTRACTION, BILATERAL Bilateral   . COLONOSCOPY  11/10/2011   Dr. [name]: hemorrhoids, tubular adenoma  . CORNEAL TRANSPLANT Bilateral   . CORONARY ARTERY BYPASS GRAFT  ~ 2003   "CABG X3"  . CORONARY BALLOON ANGIOPLASTY N/A 11/26/2020   Procedure: CORONARY BALLOON ANGIOPLASTY;  Surgeon: Troy Sine, MD;  Location: [place] CV LAB;  Service: Cardiovascular;  Laterality:  N/A;  . CORONARY STENT INTERVENTION N/A 06/05/2020   Procedure: CORONARY STENT INTERVENTION;  Surgeon: Lorretta Harp, MD;  Location: [place] CV LAB;  Service: Cardiovascular;  Laterality: N/A;  . EYE SURGERY    . INSERTION OF RETROGRADE CAROTID STENT Right 05/04/2018   Procedure: INSERTION OF RIGHT  CAROTID STENT using an ABBOT- XACT carotid stent system;  Surgeon: Serafina Mitchell, MD;  Location: [place];  Service: Vascular;  Laterality: Right;  . LAPAROSCOPIC CHOLECYSTECTOMY  2007  . LEFT HEART CATH AND CORS/GRAFTS ANGIOGRAPHY N/A 10/07/2016   Procedure: Left Heart Cath and Cors/Grafts Angiography;  Surgeon: Troy Sine, MD;  Location: [place] CV LAB;  Service: Cardiovascular;  Laterality: N/A;  . LEFT HEART CATH AND CORS/GRAFTS ANGIOGRAPHY N/A 06/05/2020   Procedure: LEFT HEART CATH AND CORS/GRAFTS ANGIOGRAPHY;  Surgeon: Lorretta Harp, MD;  Location: [place] CV LAB;  Service: Cardiovascular;  Laterality: N/A;  . LEFT HEART CATH AND CORS/GRAFTS ANGIOGRAPHY N/A 11/26/2020   Procedure: LEFT HEART CATH AND CORS/GRAFTS ANGIOGRAPHY;  Surgeon: Troy Sine, MD;  Location: [place] CV LAB;  Service: Cardiovascular;  Laterality: N/A;  . PARTIAL LARYNGECTOMY  1998  . TRACHEOSTOMY  03/13/2018   "@ [facility]"   HPI: Kenneth [name] is a 80 y.o. male with medical history significant for CAD s/p CABG, recent NSTEMI s/p stent placement, currently has 14 day Zio in place, hyperlipidemia (statin intolerance), multiple prior  TIA and strokes, stage 3b CKD, carotid artery disease history of right carotid stent,  History of kidney stones, history of laryngeal carcinoma now with chronic tracheostomy, depression, GERD, sinus bradycardia, PVD and other medical history detailed below.  He presented to ED with symptoms of acute onset of dizziness, unsteady gait, difficulty ambulating and almost fell down.  This occurred approximately 30 minutes prior to arrival.  He had been feeling well at his  baseline prior to the acute symptoms.  Fortunately the symptoms rapidly began to improve.  He had no chest pain, no SOB, no headache, no vision change, no fever or chills.  He denied having palpitations.      Assessment / Plan / Recommendation Clinical Impression  Pt screened at bedside for any speech, language, and/or cognitive changes. Pt and family confirm that Pt is at baseline. He has a partial laryngectomy and trach without PMSV, but previously had. Pt expressed a strong desire to obtain another one to avoid having to use finger occlusion. PMSV placed and Pt tolerated well. He was alert, cooperative, good eye contact, and able to verbally express himself by redirecting air through the upper airway. Pt needed reminders that he did not need to use his finger to occlude to speak. Pt and son educated on PMSV use and ok to wear during all waking hours. No further SLP services indicated at this time. Above to RN and MD. SLP Visit Diagnosis: Aphonia (R49.1)    SLP Assessment  Patient does not need any further Speech Lanaguage Pathology Services    Follow Up Recommendations  None    Frequency and Duration         PMSV Trial PMSV was placed for: 15 Able to redirect subglottic air through upper airway: Yes Able to Attain Phonation: Yes Voice Quality: Hoarse (baseline due to partial laryngectomy) Able to Expectorate Secretions: No attempts Breath Support for Phonation: Mildly decreased Intelligibility: Intelligible Behavior: Alert;Cooperative;Expresses self well;Good eye contact   Tracheostomy Tube       Vent Dependency       Cuff Deflation Trial  Thank you,  Genene Churn, [place]           [place] 12/18/2020, 4:19 PM

## 2020-12-18 NOTE — Evaluation (Signed)
Occupational Therapy Evaluation Patient Details Name: ASTIN SAYRE MRN: [id number] DOB: 1940-09-16 Today's Date: 12/18/2020    History of Present Illness CHANCE MUNTER is a 80 y.o. male with medical history significant for CAD s/p CABG, recent NSTEMI s/p stent placement, currently has 2 day Zio in place, hyperlipidemia (statin intolerance), multiple prior TIA and strokes, stage 3b CKD, carotid artery disease history of right carotid stent,  History of kidney stones, history of laryngeal carcinoma now with chronic tracheostomy, depression, GERD, sinus bradycardia, PVD and other medical history detailed below.  He presented to ED with symptoms of acute onset of dizziness, unsteady gait, difficulty ambulating and almost fell down.  This occurred approximately 30 minutes prior to arrival.  He had been feeling well at his baseline prior to the acute symptoms.  Fortunately the symptoms rapidly began to improve.  He had no chest pain, no SOB, no headache, no vision change, no fever or chills.  He denied having palpitations.   Clinical Impression   Pt agreeable to OT evaluation this date. Pt presents to be at our near baseline levels due to WNL UE strength and function. Able to complete independent sit to stand and ambulation within the room to sink and back to EOB. Pt demonstrates WNL fine motor skills with no observed visual deficits. Pt is not recommended for continued OT in the hospital and will be discharged to care of nursing staff for the remainder of his stay.     Follow Up Recommendations  No OT follow up    Equipment Recommendations  None recommended by OT           Precautions / Restrictions Precautions Precautions: None Restrictions Weight Bearing Restrictions: No      Mobility Bed Mobility Overal bed mobility: Independent                  Transfers Overall transfer level: Independent Equipment used: None             General transfer comment: Sit to stand  and ambulationi within room with return to EOB.    Balance Overall balance assessment: Independent                                         ADL either performed or assessed with clinical judgement   ADL Overall ADL's : Independent                                             Vision Baseline Vision/History: Wears glasses Wears Glasses: Reading only (bifocals) Patient Visual Report: No change from baseline Vision Assessment?: No apparent visual deficits                Pertinent Vitals/Pain Pain Assessment: No/denies pain     Hand Dominance Right   Extremity/Trunk Assessment Upper Extremity Assessment Upper Extremity Assessment: Overall WFL for tasks assessed   Lower Extremity Assessment Lower Extremity Assessment: Defer to PT evaluation   Cervical / Trunk Assessment Cervical / Trunk Assessment: Normal   Communication Communication Communication: Tracheostomy   Cognition Arousal/Alertness: Awake/alert Behavior During Therapy: WFL for tasks assessed/performed Overall Cognitive Status: Within Functional Limits for tasks assessed  Home Living Family/patient expects to be discharged to:: Private residence Living Arrangements: Spouse/significant other Available Help at Discharge: Available 24 hours/day Type of Home: House Home Access: Stairs to enter [employer] of Steps: 1 Entrance Stairs-Rails: None Home Layout: One level     Bathroom Shower/Tub: Occupational psychologist: Handicapped height     Home Equipment: [place] - single point;Walker - 2 wheels;Wheelchair - manual;Grab bars - tub/shower          Prior Functioning/Environment Level of Independence: Independent        Comments: pt drives                      OT Goals(Current goals can be found in the care plan section) Acute Rehab OT Goals Patient Stated Goal:  return home  OT Frequency:      AM-PAC OT "6 Clicks" Daily Activity     Outcome Measure Help from another person eating meals?: None Help from another person taking care of personal grooming?: None Help from another person toileting, which includes using toliet, bedpan, or urinal?: None Help from another person bathing (including washing, rinsing, drying)?: None Help from another person to put on and taking off regular upper body clothing?: None Help from another person to put on and taking off regular lower body clothing?: None 6 Click Score: 24   End of Session    Activity Tolerance: Patient tolerated treatment well Patient left: in bed;with call bell/phone within reach  OT Visit Diagnosis: Other symptoms and signs involving the nervous system (R29.898)                Time: 9292-4462 OT Time Calculation (min): 9 min Charges:  OT General Charges $OT Visit: 1 Visit OT Evaluation $OT Eval Low Complexity: 1 Low  Zaineb Nowaczyk OT, MOT   Larey Seat 12/18/2020, 9:36 AM

## 2020-12-18 NOTE — Progress Notes (Signed)
SLP Cancellation Note  Patient Details Name: TORIEN RAMROOP MRN: [id number] DOB: May 23, 1941   Cancelled treatment:       Reason Eval/Treat Not Completed: Other (comment);Patient at procedure or test/unavailable (Pt in room for a procedure. SLP will check back later for SLE.)  Thank you,  Genene Churn, [place]  [place] 12/18/2020, 9:13 AM

## 2020-12-18 NOTE — Progress Notes (Signed)
*  PRELIMINARY RESULTS* Echocardiogram 2D Echocardiogram has been performed.  Kenneth [name] 12/18/2020, 10:09 AM

## 2020-12-18 NOTE — Consult Note (Signed)
[place] A. Kenneth Laughter, MD     [email or web address]          Kenneth [name] is an 80 y.o. male.   ASSESSMENT/PLAN: 1. Transit acute spell of gait instability; given the patient's history it is likely that the patient may have had a TIA.  Dual antiplatelet agents are recommended. Continue with blood pressure and dyslipidemia control. 2. 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 Given his sister, stenting seems like a better option. 3. Previous infarct with left facial and left upper extremity symptoms 4. 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 Patient was not able to stand and almost fell. He was helped by bystanders. He denies focal weakness or numbness. He denies loss of consciousness, chest pain or palpitation. He reports that he did have some tightness during latter part of the event. No dyspnea is reported however. He denies any dysarthria that he could ascertain but he was told by others that he may have been mildly dysarthric. The patient reports that he has had other ischemic events in the past. One event resulted in the patient having numbness and the weakness of the left facial region and left upper extremity. Patient tells me that he underwent emergent stenting in [place] after 1 of his ischemic events.History is somewhat challenging given his baseline dysarthria from having a tracheostomy. The review systems otherwise negative.       GENERAL:  The patient is doing okay at this time.  HEENT:  He has a tracheostomy in place.  ABDOMEN: soft  EXTREMITIES: No edema   BACK: Normal  SKIN: Normal by inspection.    MENTAL STATUS: Alert and oriented including orientation to his age and the month. Speech is mild to  moderately dysarthric; language and cognition are generally intact. Judgment and insight normal.   CRANIAL NERVES: Pupils are equal, round and reactive to light and accomodation; extra ocular movements are full, there is no significant nystagmus; visual fields are full; upper and lower facial muscles are normal in strength and symmetric, there is no flattening of the nasolabial folds; tongue is midline; uvula is midline; shoulder elevation is normal.  MOTOR: Normal tone, bulk and strength; no pronator drift. There is no drift of the upper lower extremities.  COORDINATION: Left finger to nose is normal, right finger to nose is normal, No rest tremor; no intention tremor; no postural tremor; no bradykinesia.  REFLEXES: Deep tendon reflexes are symmetrical and normal.   SENSATION: Normal to light touch, temperature, and pain. There is no extinction on double simultaneous stimulation.      NIH stroke scale 1.   Blood pressure 131/70, pulse 66, temperature 98.2 F (36.8 C), temperature source Oral, resp. rate 16, height 5\' 6"  (1.676 m), weight 83 kg, SpO2 99 %.  Past Medical History:  Diagnosis Date  . Acute ST elevation myocardial infarction (STEMI) of inferior wall [facility]) 03/01/2020   [name]  . Anxiety   . 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 Carotid artery disease ([place])    a. h/o R carotid stent.  . Chronic lower back pain    "have it at night" (05/04/2018)  . CKD (chronic kidney disease), stage III ([place])   . Coronary artery disease    s/p CABG 2003, inferior  STEMI 02/2020 s/p DES to RCA, NSTEMI 06/05/20 with DES to RCA, NSTEMI 11/2020 s/p PTCA to [facility]  . CVA (cerebral vascular accident) ([place]) 10/2017   "little numb on my left face since" (05/04/2018)  . Depression   . GERD (gastroesophageal reflux disease)   . High triglycerides   . History of kidney stones   . Hypertension   . 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 Peripheral vascular disease ([place])   . Sinus  bradycardia   . Stroke [facility])    "he's had several little strokes; many that he wasn't aware of" (05/04/2018)  . Tobacco abuse   . Tracheal stenosis     Past Surgical History:  Procedure Laterality Date  . CAROTID STENT Right 06-13-12; 05/04/2018  . CAROTID STENT INSERTION N/A 06/13/2012   Procedure: CAROTID STENT INSERTION;  Surgeon: Serafina Mitchell, MD;  [facility] C[facility] LAB;  Service: Cardiovascular;  Laterality: N/A;  . CATARACT EXTRACTION, BILATERAL Bilateral   . COLONOSCOPY  11/10/2011   Dr. [name]: hemorrhoids, tubular adenoma  . CORNEAL TRANSPLANT Bilateral   . CORONARY ARTERY BYPASS GRAFT  ~ 2003   "CABG X3"  . CORONARY BALLOON ANGIOPLASTY N/A 11/26/2020   Procedure: CORONARY BALLOON ANGIOPLASTY;  Surgeon: [place] Sine, MD;  Location: [place] CV LAB;  Service: Cardiovascular;  Laterality: N/A;  . CORONARY STENT INTERVENTION N/A 06/05/2020   Procedure: CORONARY STENT INTERVENTION;  Surgeon: Lorretta Harp, MD;  Location: [place] CV LAB;  Service: Cardiovascular;  Laterality: N/A;  . EYE SURGERY    . INSERTION OF RETROGRADE CAROTID STENT Right 05/04/2018   Procedure: INSERTION OF RIGHT  CAROTID STENT using an ABBOT- XACT carotid stent system;  Surgeon: Serafina Mitchell, MD;  Location: [name];  Service: Vascular;  Laterality: Right;  . LAPAROSCOPIC CHOLECYSTECTOMY  2007  . LEFT HEART CATH AND CORS/GRAFTS ANGIOGRAPHY N/A 10/07/2016   Procedure: Left Heart Cath and Cors/Grafts Angiography;  Surgeon: [place] Sine, MD;  Location: [place] CV LAB;  Service: Cardiovascular;  Laterality: N/A;  . LEFT HEART CATH AND CORS/GRAFTS ANGIOGRAPHY N/A 06/05/2020   Procedure: LEFT HEART CATH AND CORS/GRAFTS ANGIOGRAPHY;  Surgeon: Lorretta Harp, MD;  Location: [place] CV LAB;  Service: Cardiovascular;  Laterality: N/A;  . LEFT HEART CATH AND CORS/GRAFTS ANGIOGRAPHY N/A 11/26/2020   Procedure: LEFT HEART CATH AND CORS/GRAFTS ANGIOGRAPHY;  Surgeon: [place] Sine, MD;  Location:  [place] CV LAB;  Service: Cardiovascular;  Laterality: N/A;  . PARTIAL LARYNGECTOMY  1998  . TRACHEOSTOMY  03/13/2018   "@ [facility]"    Family History  Problem Relation Age of Onset  . Dementia Mother   . Heart disease Father   . Cancer Brother   . Heart disease Brother   . Hyperlipidemia Brother   . Cancer Brother   . Colon cancer Neg Hx     Social History:  reports that he quit smoking about 50 years ago. His smoking use included cigarettes. He started smoking about 67 years ago. He has a 15.00 pack-year smoking history. He quit smokeless tobacco use about 18 years ago.  His smokeless tobacco use included chew. He reports that he does not drink alcohol and does not use drugs.  Allergies:  Allergies  Allergen Reactions  . Nifedipine Other (See Comments)  . Lorazepam Other (See Comments)    REACTION: Alters mental status. "Turns into maniac" REACTION: Alters mental status. "Turns into maniac" REACTION: Alters mental status. "Turns into maniac"  . Other  REACTION: Unknown to patient. States that MD states allergy per medical records  . Penicillins Hives    Has patient had a PCN reaction causing immediate rash, facial/tongue/throat swelling, SOB or lightheadedness with hypotension: Yes Has patient had a PCN reaction causing severe rash involving mucus membranes or skin necrosis: No Has patient had a PCN reaction that required hospitalization No Has patient had a PCN reaction occurring within the last 10 years: No If all of the above answers are "NO", then may proceed with Cephalosporin use.  HAS TOLERATED: cephalexin  . Sulfacetamide     REACTION: Unknown to patient. States that MD states allergy per medical records  . Sulfonamide Derivatives Other (See Comments)    REACTION: Unknown to patient. States that MD states allergy per medical records  . Erythromycin Rash  . Statins Itching and Rash    Rash on his trunk and arms, swelling in his lips  . Vancomycin Itching     Medications: Prior to Admission medications   Medication Sig Start Date End Date Taking? Authorizing Provider  acetaminophen (TYLENOL) 500 MG tablet Take 500 mg by mouth every 6 (six) hours as needed.   Yes [provider]  Alirocumab (PRALUENT) 75 MG/ML SOAJ Inject 1 pen into the skin every 14 (fourteen) days. 07/04/20  Yes Arnoldo Lenis, MD  amLODipine (NORVASC) 10 MG tablet Take 1 tablet by mouth once daily 05/05/20  Yes Branch, Alphonse Guild, MD  aspirin EC 81 MG tablet Take 325 mg by mouth daily. Swallow whole.   Yes [provider]  clopidogrel (PLAVIX) 75 MG tablet Take 1 tablet by mouth once daily 02/13/20  Yes Branch, Alphonse Guild, MD  ezetimibe (ZETIA) 10 MG tablet Take 1 tablet by mouth once daily 12/08/20  Yes Branch, Alphonse Guild, MD  metoprolol succinate (TOPROL XL) 25 MG 24 hr tablet Take 0.5 tablets (12.5 mg total) by mouth daily. 12/12/20  Yes Dunn, Dayna N, PA-C  pantoprazole (PROTONIX) 40 MG tablet Take 1 tablet (40 mg total) by mouth daily. 06/07/20  Yes Kathyrn Drown D, NP  PARoxetine (PAXIL) 20 MG tablet Take 20 mg by mouth at bedtime.   Yes [provider]  nitroGLYCERIN (NITROSTAT) 0.4 MG SL tablet Place 1 tablet (0.4 mg total) under the tongue every 5 (five) minutes as needed for chest pain. 01/31/17 05/25/21  Lendon Colonel, NP    Scheduled Meds: . aspirin EC  325 mg Oral Daily  . clopidogrel  75 mg Oral Daily  . ezetimibe  10 mg Oral Daily  . heparin  5,000 Units Subcutaneous Q8H  . pantoprazole  40 mg Oral Daily  . PARoxetine  20 mg Oral QHS  . sodium chloride flush  3 mL Intravenous Once   Continuous Infusions: . sodium chloride 60 mL/hr at 12/17/20 1817   PRN Meds:.acetaminophen **OR** acetaminophen (TYLENOL) oral liquid 160 mg/5 mL **OR** acetaminophen, nitroGLYCERIN, senna-docusate     Results for orders placed or performed during the hospital encounter of 12/17/20 (from the past 48 hour(s))  Protime-INR     Status: None    Collection Time: 12/17/20 12:09 PM  Result Value Ref Range   Prothrombin Time 13.3 11.4 - 15.2 seconds   INR 1.0 0.8 - 1.2    Comment: (NOTE) INR goal varies based on device and disease states. Performed at [facility], [address], [place], [place] [address]   APTT     Status: None   Collection Time: 12/17/20 12:09 PM  Result Value Ref  Range   aPTT 25 24 - 36 seconds    Comment: Performed at [facility], [address] [place], [place] [address]  CBC     Status: None   Collection Time: 12/17/20 12:09 PM  Result Value Ref Range   WBC 8.4 4.0 - 10.5 K/uL   RBC 4.23 4.22 - 5.81 MIL/uL   Hemoglobin 13.0 13.0 - 17.0 g/dL   HCT 40.7 39.0 - 52.0 %   MCV 96.2 80.0 - 100.0 fL   MCH 30.7 26.0 - 34.0 pg   MCHC 31.9 30.0 - 36.0 g/dL   RDW 13.9 11.5 - 15.5 %   Platelets 283 150 - 400 K/uL   nRBC 0.0 0.0 - 0.2 %    Comment: Performed at [facility], [address], [place], [place] [address]  Differential     Status: None   Collection Time: 12/17/20 12:09 PM  Result Value Ref Range   Neutrophils Relative % 63 %   Neutro Abs 5.3 1.7 - 7.7 K/uL   Lymphocytes Relative 25 %   Lymphs Abs 2.1 0.7 - 4.0 K/uL   Monocytes Relative 9 %   Monocytes Absolute 0.8 0.1 - 1.0 K/uL   Eosinophils Relative 2 %   Eosinophils Absolute 0.2 0.0 - 0.5 K/uL   Basophils Relative 1 %   Basophils Absolute 0.0 0.0 - 0.1 K/uL   Immature Granulocytes 0 %   Abs Immature Granulocytes 0.03 0.00 - 0.07 K/uL    Comment: Performed at [facility], [address], [place], [place] [address]  Comprehensive metabolic panel     Status: Abnormal   Collection Time: 12/17/20 12:09 PM  Result Value Ref Range   Sodium 135 135 - 145 mmol/L   Potassium 4.8 3.5 - 5.1 mmol/L   Chloride 101 98 - 111 mmol/L   CO2 27 22 - 32 mmol/L   Glucose, Bld 82 70 - 99 mg/dL    Comment: Glucose reference range applies only to samples taken after fasting for at least 8 hours.   BUN 24 (H) 8 - 23 mg/dL   Creatinine, Ser 1.93 (H) 0.61 -  1.24 mg/dL   Calcium 9.1 8.9 - 10.3 mg/dL   Total Protein 7.7 6.5 - 8.1 g/dL   Albumin 4.2 3.5 - 5.0 g/dL   AST 16 15 - 41 U/L   ALT 16 0 - 44 U/L   Alkaline Phosphatase 122 38 - 126 U/L   Total Bilirubin 0.6 0.3 - 1.2 mg/dL   GFR, Estimated 34 (L) >60 mL/min    Comment: (NOTE) Calculated using the CKD-EPI Creatinine Equation (2021)    Anion gap 7 5 - 15    Comment: Performed at [facility], [address], [place], [place] [address]  CBG monitoring, ED     Status: Abnormal   Collection Time: 12/17/20 12:38 PM  Result Value Ref Range   Glucose-Capillary 110 (H) 70 - 99 mg/dL    Comment: Glucose reference range applies only to samples taken after fasting for at least 8 hours.  SARS CORONAVIRUS 2 (TAT 6-24 HRS) Nasopharyngeal Nasopharyngeal Swab     Status: None   Collection Time: 12/17/20  3:22 PM   Specimen: Nasopharyngeal Swab  Result Value Ref Range   SARS Coronavirus 2 NEGATIVE NEGATIVE    Comment: (NOTE) SARS-CoV-2 target nucleic acids are NOT DETECTED.  The SARS-CoV-2 RNA is generally detectable in upper and lower respiratory specimens during the acute phase of infection. Negative results do not preclude SARS-CoV-2 infection,  do not rule out co-infections with other pathogens, and should not be used as the sole basis for treatment or other patient management decisions. Negative results must be combined with clinical observations, patient history, and epidemiological information. The expected result is Negative.  Fact Sheet for Patients: SugarRoll.be  Fact Sheet for Healthcare Providers: https://www.woods-mathews.com/  This test is not yet approved or cleared by the [place] FDA and  has been authorized for detection and/or diagnosis of SARS-CoV-2 by FDA under an Emergency Use Authorization (EUA). This EUA will remain  in effect (meaning this test can be used) for the duration of the COVID-19 declaration under Se ction  564(b)(1) of the Act, 21 U.S.C. section 360bbb-3(b)(1), unless the authorization is terminated or revoked sooner.  Performed at [facility], [place] [address] [place], [place] [address]   Lipid panel     Status: Abnormal   Collection Time: 12/18/20  6:23 AM  Result Value Ref Range   Cholesterol 88 0 - 200 mg/dL   Triglycerides 205 (H) <150 mg/dL   HDL 33 (L) >40 mg/dL   Total CHOL/HDL Ratio 2.7 RATIO   VLDL 41 (H) 0 - 40 mg/dL   LDL Cholesterol 14 0 - 99 mg/dL    Comment:        Total Cholesterol/HDL:CHD Risk Coronary Heart Disease Risk Table                     Men   Women  1/2 Average Risk   3.4   3.3  Average Risk       5.0   4.4  2 X Average Risk   9.6   7.1  3 X Average Risk  23.4   11.0        Use the calculated Patient Ratio above and the CHD Risk Table to determine the patient's CHD Risk.        ATP III CLASSIFICATION (LDL):  <100     mg/dL   Optimal  100-129  mg/dL   Near or Above                    Optimal  130-159  mg/dL   Borderline  160-189  mg/dL   High  >190     mg/dL   Very High Performed at [address] [place], [place] [address]   Basic metabolic panel     Status: Abnormal   Collection Time: 12/18/20  6:23 AM  Result Value Ref Range   Sodium 137 135 - 145 mmol/L   Potassium 5.3 (H) 3.5 - 5.1 mmol/L   Chloride 103 98 - 111 mmol/L   CO2 28 22 - 32 mmol/L   Glucose, Bld 111 (H) 70 - 99 mg/dL    Comment: Glucose reference range applies only to samples taken after fasting for at least 8 hours.   BUN 25 (H) 8 - 23 mg/dL   Creatinine, Ser 1.73 (H) 0.61 - 1.24 mg/dL   Calcium 9.1 8.9 - 10.3 mg/dL   GFR, Estimated 39 (L) >60 mL/min    Comment: (NOTE) Calculated using the CKD-EPI Creatinine Equation (2021)    Anion gap 6 5 - 15    Comment: Performed at [facility], [address], [place], [place] [address]  Magnesium     Status: None   Collection Time: 12/18/20  6:23 AM  Result Value Ref Range   Magnesium 2.2 1.7 - 2.4 mg/dL     Comment:  Performed at [facility], [address] [place], [place] [address]    Studies/Results:  EEG IMPRESSION: This study is within normal limits. No seizures or epileptiform discharges were seen throughout the recording.   CAROTID DOPPLERS  IMPRESSION: 1. Severe (70-99%) stenosis proximal left internal carotid artery secondary to bulky, heterogeneous and calcified atherosclerotic plaque. 2. Mild (1-49%) stenosis proximal right internal carotid artery secondary to heterogenous atherosclerotic plaque. 3. Patent right common carotid artery stent. 4. Vertebral arteries are patent with normal antegrade flow.       BRAIN MRI FINDINGS: Brain:  Moderate generalized cerebral atrophy. Comparatively mild cerebellar atrophy.  Redemonstrated chronic right frontoparietal cortically based infarcts.  Advanced multifocal T2/FLAIR hyperintensity within the cerebral white matter and pons, nonspecific but compatible with chronic small vessel ischemic disease.  A small chronic cortical infarct within the left occipital lobe is new as compared to the prior brain MRI of 11/15/2017. Mild chronic hemosiderin deposition and/or cortical laminar necrosis at this site.  There are a few scattered supratentorial chronic microhemorrhages, nonspecific.  Subcentimeter chronic infarct within the left cerebellar hemisphere, a new finding as compared to the brain MRI of 11/15/2017. Redemonstrated small chronic infarct within the right cerebellar hemisphere.  There is no acute infarct.  No evidence of intracranial mass.  No extra-axial fluid collection.  No midline shift.  Vascular: Expected proximal arterial flow voids.  Skull and upper cervical spine: No focal marrow lesion.  Sinuses/Orbits: Visualized orbits show no acute finding. Bilateral lens replacements. Mild bilateral ethmoid and left sphenoid sinus mucosal thickening.  IMPRESSION: No evidence of acute  intracranial abnormality.  Redemonstrated small chronic cortically-based right frontoparietal infarcts.  Small chronic left occipital lobe cortical infarct, a new finding as compared to the brain MRI of 11/15/2017.  A small chronic infarct within the left cerebellar hemisphere is new as compared to the prior MRI. Unchanged small chronic infarct within the right cerebellar hemisphere.  Stable severe chronic small vessel ischemic changes within the cerebral white matter and pons.  Stable moderate cerebral atrophy with comparatively mild cerebellar atrophy.  Mild paranasal sinus disease, as described.    TTE  1. Left ventricular ejection fraction, by estimation, is 65 to 70%. The  left ventricle has normal function. The left ventricle has no regional  wall motion abnormalities. Left ventricular diastolic parameters were  normal.  2. Right ventricular systolic function is normal. The right ventricular  size is normal. There is normal pulmonary artery systolic pressure. The  estimated right ventricular systolic pressure is 67.5 mmHg.  3. Left atrial size was mildly dilated.  4. The mitral valve is grossly normal. Trivial mitral valve  regurgitation.  5. The aortic valve is tricuspid. There is moderate calcification of the  aortic valve. Aortic valve regurgitation is not visualized. Mild to  moderate aortic valve sclerosis/calcification is present, without any  evidence of aortic stenosis. Aortic valve  mean gradient measures 7.0 mmHg.  6. The inferior vena cava is normal in size with greater than 50%  respiratory variability, suggesting right atrial pressure of 3 mmHg.     Brain MRI scan is reviewed in person and shows no acute changes. There is moderate periventricular and deep white matter leukoencephalopathy consistent with chronic microvascular changes. She is also noted in the pontine you areas. There is mild encephalomalacia with the gyral changes involving  the left occipital lobe consistent with her remote small to moderate-sized cortical infarct.    Kennia Vanvorst A. Kenneth [name], M.D.  Diplomate, Tax adviser of Psychiatry and Neurology (  Neurology). 12/18/2020, 7:13 PM

## 2020-12-18 NOTE — Procedures (Signed)
Patient Name: Kenneth [name]  MRN: [id number]  Epilepsy Attending: Lora Havens  Referring Physician/Provider: Dr Irwin Brakeman Date: 12/18/2020 Duration: 22.43 mins  Patient history: 80yo M with transient dizziness. EEG to evaluate for seizure  Level of alertness: Awake  AEDs during EEG study: None  Technical aspects: This EEG study was done with scalp electrodes positioned according to the 10-20 International system of electrode placement. Electrical activity was acquired at a sampling rate of 500Hz  and reviewed with a high frequency filter of 70Hz  and a low frequency filter of 1Hz . EEG data were recorded continuously and digitally stored.   Description: The posterior dominant rhythm consists of 9-10 Hz activity of moderate voltage (25-35 uV) seen predominantly in posterior head regions, symmetric and reactive to eye opening and eye closing. Hyperventilation and photic stimulation were not performed.     IMPRESSION: This study is within normal limits. No seizures or epileptiform discharges were seen throughout the recording.  [name]

## 2020-12-18 NOTE — Care Management Obs Status (Signed)
[place] NOTIFICATION   Patient Details  Name: Kenneth [name] MRN: [id number] Date of Birth: [birth year]   Medicare Observation Status Notification Given:  Yes    Tommy Medal 12/18/2020, 4:32 PM

## 2020-12-18 NOTE — Evaluation (Signed)
Physical Therapy Evaluation Patient Details Name: Kenneth [name] MRN: [id number] DOB: 11-12-1940 Today's Date: 12/18/2020   History of Present Illness  Kenneth [name] is a 80 y.o. male with medical history significant for CAD s/p CABG, recent NSTEMI s/p stent placement, currently has 66 day Zio in place, hyperlipidemia (statin intolerance), multiple prior TIA and strokes, stage 3b CKD, carotid artery disease history of right carotid stent,  History of kidney stones, history of laryngeal carcinoma now with chronic tracheostomy, depression, GERD, sinus bradycardia, PVD and other medical history detailed below.  He presented to ED with symptoms of acute onset of dizziness, unsteady gait, difficulty ambulating and almost fell down.  This occurred approximately 30 minutes prior to arrival.  He had been feeling well at his baseline prior to the acute symptoms.  Fortunately the symptoms rapidly began to improve.  He had no chest pain, no SOB, no headache, no vision change, no fever or chills.  He denied having palpitations.    Clinical Impression  Patient functioning at baseline for functional mobility and gait.  Plan:  Patient discharged from physical therapy to care of nursing for ambulation daily as tolerated for length of stay.     Follow Up Recommendations No PT follow up    Equipment Recommendations  None recommended by PT    Recommendations for Other Services       Precautions / Restrictions Precautions Precautions: None Restrictions Weight Bearing Restrictions: No      Mobility  Bed Mobility Overal bed mobility: Independent                  Transfers Overall transfer level: Independent Equipment used: None             General transfer comment: Sit to stand and ambulationi within room with return to EOB.  Ambulation/Gait Ambulation/Gait assistance: Modified independent (Device/Increase time) Gait Distance (Feet): 200 Feet Assistive device: None Gait  Pattern/deviations: WFL(Within Functional Limits) Gait velocity: decreased   General Gait Details: grossly WFL with good return for ambulation on level, inclined and declined surfaces without loss of balance  Stairs            Wheelchair Mobility    Modified Rankin (Stroke Patients Only)       Balance Overall balance assessment: No apparent balance deficits (not formally assessed)                                           Pertinent Vitals/Pain Pain Assessment: No/denies pain    Home Living Family/patient expects to be discharged to:: Private residence Living Arrangements: Spouse/significant other Available Help at Discharge: Available 24 hours/day Type of Home: House Home Access: Stairs to enter Entrance Stairs-Rails: None Entrance Stairs-Number of Steps: 1 Home Layout: One level Home Equipment: Cane - single point;Walker - 2 wheels;Wheelchair - manual;Grab bars - tub/shower      Prior Function Level of Independence: Independent         Comments: Hydrographic surveyor, drives     Hand Dominance   Dominant Hand: Right    Extremity/Trunk Assessment   Upper Extremity Assessment Upper Extremity Assessment: Defer to OT evaluation    Lower Extremity Assessment Lower Extremity Assessment: Overall WFL for tasks assessed    Cervical / Trunk Assessment Cervical / Trunk Assessment: Normal  Communication   Communication: Tracheostomy  Cognition Arousal/Alertness: Awake/alert Behavior During Therapy: WFL for tasks assessed/performed  Overall Cognitive Status: Within Functional Limits for tasks assessed                                        General Comments      Exercises     Assessment/Plan    PT Assessment Patent does not need any further PT services  PT Problem List         PT Treatment Interventions      PT Goals (Current goals can be found in the Care Plan section)  Acute Rehab PT Goals Patient Stated  Goal: return home with family to assist PT Goal Formulation: With patient Time For Goal Achievement: 12/18/20 Potential to Achieve Goals: Good    Frequency     Barriers to discharge        Co-evaluation               AM-PAC PT "6 Clicks" Mobility  Outcome Measure Help needed turning from your back to your side while in a flat bed without using bedrails?: None Help needed moving from lying on your back to sitting on the side of a flat bed without using bedrails?: None Help needed moving to and from a bed to a chair (including a wheelchair)?: None Help needed standing up from a chair using your arms (e.g., wheelchair or bedside chair)?: None Help needed to walk in hospital room?: None Help needed climbing 3-5 steps with a railing? : None 6 Click Score: 24    End of Session   Activity Tolerance: Patient tolerated treatment well Patient left: in bed;with call bell/phone within reach Nurse Communication: Mobility status PT Visit Diagnosis: Unsteadiness on feet (R26.81);Other abnormalities of gait and mobility (R26.89);Muscle weakness (generalized) (M62.81)    Time: 3888-2800 PT Time Calculation (min) (ACUTE ONLY): 10 min   Charges:   PT Evaluation $PT Eval Low Complexity: 1 Low PT Treatments $Gait Training: 8-22 mins        12:05 PM, 12/18/20 Lonell Grandchild, MPT Physical Therapist with [facility] [phone] office [phone] mobile phone

## 2020-12-18 NOTE — Progress Notes (Signed)
PROGRESS NOTE    Kenneth [name]  VPX:[id number] DOB: 04-Aug-1940 DOA: 12/17/2020 PCP: [name], MD     Brief Narrative:  Kenneth [name] is a 80 y.o. WM PMHx CAD s/p CABG, recent NSTEMI s/p stent placement, currently has 14 day Zio in place sinus bradycardia, HLD (statin intolerance), multiple prior TIA and strokes, CKD stage IIIb, carotid artery disease history of right carotid stent, nephrolithiasis, Hx laryngeal carcinoma now with chronic tracheostomy, depression, GERD,, PVD  .   Presented to ED with symptoms of acute onset of dizziness, unsteady gait, difficulty ambulating and almost fell down.  This occurred approximately 30 minutes prior to arrival.  He had been feeling well at his baseline prior to the acute symptoms.  Fortunately the symptoms rapidly began to improve.  He had no chest pain, no SOB, no headache, no vision change, no fever or chills.  He denied having palpitations.      ED Course: Pt was afebrile, HR 52, BP 118/75, temp 98.2, R 17, pulse ox 100% on RA, Na 135, creatinine 1.93, Hg 13.0, CT Head questionable new small left cerebellar infarct, Code stroke called, no TPA given, recommendations were to continue aspirin and plavix and admit for a full CVA work up and inpatient neurology consultation.     Subjective: No x4, patient with trach in place.  States has had 3 cardiac stents placed in the last 2 years.  Currently negative headache other significant neurological deficits.   Assessment & Plan: Covid vaccination; vaccinated 3/3   Principal Problem:   Acute CVA (cerebrovascular accident) ([place]) Active Problems:   HLD (hyperlipidemia)   GASTROESOPHAGEAL REFLUX DISEASE   DOE (dyspnea on exertion)   Tracheal stenosis   Essential hypertension   Constipation   TIA (transient ischemic attack)   Ischemic stroke (HCC)   SOB (shortness of breath)   Tracheostomy dependent (HCC)   NSTEMI (non-ST elevated myocardial infarction) ([place])   Hx of CABG   Statin myopathy    Unstable angina (HCC)   CKD (chronic kidney disease) stage 3, GFR 30-59 ml/min (HCC)   Sinus bradycardia  Acute CVA - Patient appears to have had a small CVA, see MRI brain. - Patient currently with no neurological deficits on exam. - 5/26 awaiting clearance by neurology for discharge. - ASA 325 mg daily + Plavix 75 mg daily - Hemoglobin A1c pending - Lipid panel pending - Would like to discharge patient in the a.m. unless neurology, does not want patient discharged for some further work-up. - Would like patient to follow-up with neurology in 2 to 4 weeks post discharge  CKD stage IIIb Lab Results  Component Value Date   CREATININE 1.73 (H) 12/18/2020   CREATININE 1.93 (H) 12/17/2020   CREATININE 1.77 (H) 12/16/2020   CREATININE 2.13 (H) 12/12/2020   CREATININE 1.58 (H) 11/27/2020    CAD s/p recent NSTEMI  -He has a newly placed stent -See acute CVA - 5/26 insignificant findings on echocardiogram see results below -Has a 14 day Zio monitor in place by his cardiologist to evaluate for arrhthymias.    -Schedule follow-up appointment with cardiology in 2 to 4 weeks post discharge   Essential hypertension  -Allow permissive HTN.  Restart BP medication at this time.   Sinus bradycardia - Temporarily holding metoprolol due to dizziness and near fall at home.  -See essential hypertension  Tracheostomy dependent - Patient provided Passy-Muir valve today    GERD  - protonix for GI protection.   Obese (BMI 29.54  kg/m)     DVT prophylaxis: Heparin Code Status: Full Family Communication: 5/26 wife at bedside for discussion of plan of care all questions answered Status is: Inpatient    Dispo: The patient is from: Home              Anticipated d/c is to: Home              Anticipated d/c date is: 5/27              Patient currently unstable      Consultants:  Neurology Cardiology   Procedures/Significant Events:  5/25 CT head code stroke:There is no  acute intracranial hemorrhage or evidence of definite acute infarction. ASPECT score is 10.  Questionable new small left cerebellar infarct.  Chronic right frontoparietal cortical infarct. Moderate chronic microvascular ischemic changes. 5/25 MRI brain WO contrast;-No evidence of acute intracranial abnormality. -small chronic cortically-based right frontoparietal infarcts. -Small chronic left occipital lobe cortical infarct, a new finding as compared to the brain MRI of 11/15/2017. -A small chronic infarct within the left cerebellar hemisphere is new as compared to the prior MRI.  -Unchanged small chronic infarct within the right cerebellar hemisphere. -Stable severe chronic small vessel ischemic changes within the cerebral white matter and pons. -Stable moderate cerebral atrophy with comparatively mild cerebellar atrophy. 5/25 US carotid bilateral Severe (70-99%) stenosis proximal left internal carotid artery secondary to bulky, heterogeneous and calcified atherosclerotic plaque. 2. Mild (1-49%) stenosis proximal right internal carotid artery secondary to heterogenous atherosclerotic plaque. 3. Patent right common carotid artery stent. 4. Vertebral arteries are patent with normal antegrade flow. 5/26 EEG: WNL 5/26 echocardiogram: Left Ventricle: Left ventricular ejection fraction, by estimation, is 65  to 70%. The left ventricle has normal function.    I have personally reviewed and interpreted all radiology studies and my findings are as above.  VENTILATOR SETTINGS:    Cultures   Antimicrobials:    Devices    LINES / TUBES:      Continuous Infusions: . sodium chloride    . sodium chloride 60 mL/hr at 12/17/20 1817     Objective: Vitals:   12/17/20 2000 12/17/20 2100 12/17/20 2357 12/18/20 0400  BP: 132/66 (!) 160/80 (!) 130/58 (!) 121/53  Pulse: 62 (!) 56 (!) 56 60  Resp: 18 12 18    Temp:  98.2 F (36.8 C) 98.2 F (36.8 C) 98.6 F (37 C)  TempSrc:   Oral  Oral  SpO2: 100% 99% 98% 95%  Weight:      Height:       No intake or output data in the 24 hours ending 12/18/20 0739 Filed Weights   12/17/20 1214  Weight: 83 kg    Examination:  General: A/O x4, No acute respiratory distress Eyes: negative scleral hemorrhage, negative anisocoria, negative icterus ENT: Negative Runny nose, negative gingival bleeding, Neck:  Negative scars, masses, torticollis, lymphadenopathy, JVD, tracheostomy in place.  Assessment infection Lungs: Clear to auscultation bilaterally without wheezes or crackles Cardiovascular: Regular rate and rhythm without murmur gallop or rub normal S1 and S2 Abdomen: negative abdominal pain, nondistended, positive soft, bowel sounds, no rebound, no ascites, no appreciable mass Extremities: No significant cyanosis, clubbing, or edema bilateral lower extremities Skin: Negative rashes, lesions, ulcers Psychiatric:  Negative depression, negative anxiety, negative fatigue, negative mania  Central nervous system:  Cranial nerves II through XII intact, tongue/uvula midline, all extremities muscle strength 5/5, sensation intact throughout, finger nose finger bilateral within normal limits, quick finger touch bilateral  within normal limits, heel to shin bilateral within normal limits, negative dysarthria, negative expressive aphasia, negative receptive aphasia.  .     Data Reviewed: Care during the described time interval was provided by me .  I have reviewed this patient's available data, including medical history, events of note, physical examination, and all test results as part of my evaluation.  CBC: Recent Labs  Lab 12/17/20 1209  WBC 8.4  NEUTROABS 5.3  HGB 13.0  HCT 40.7  MCV 96.2  PLT 270   Basic Metabolic Panel: Recent Labs  Lab 12/12/20 1511 12/16/20 1439 12/17/20 1209 12/18/20 0623  NA 136 134* 135 137  K 5.3* 5.4* 4.8 5.3*  CL 104 101 101 103  CO2 22 27 27 28   GLUCOSE 91 82 82 111*  BUN 27* 20 24*  25*  CREATININE 2.13* 1.77* 1.93* 1.73*  CALCIUM 8.6* 8.8* 9.1 9.1  MG  --   --   --  2.2   GFR: Estimated Creatinine Clearance: 33.9 mL/min (A) (by C-G formula based on SCr of 1.73 mg/dL (H)). Liver Function Tests: Recent Labs  Lab 12/17/20 1209  AST 16  ALT 16  ALKPHOS 122  BILITOT 0.6  PROT 7.7  ALBUMIN 4.2   No results for input(s): LIPASE, AMYLASE in the last 168 hours. No results for input(s): AMMONIA in the last 168 hours. Coagulation Profile: Recent Labs  Lab 12/17/20 1209  INR 1.0   Cardiac Enzymes: No results for input(s): CKTOTAL, CKMB, CKMBINDEX, TROPONINI in the last 168 hours. BNP (last 3 results) No results for input(s): PROBNP in the last 8760 hours. HbA1C: No results for input(s): HGBA1C in the last 72 hours. CBG: Recent Labs  Lab 12/17/20 1238  GLUCAP 110*   Lipid Profile: Recent Labs    12/18/20 0623  CHOL 88  HDL 33*  LDLCALC 14  TRIG 205*  CHOLHDL 2.7   Thyroid Function Tests: No results for input(s): TSH, T4TOTAL, FREET4, T3FREE, THYROIDAB in the last 72 hours. Anemia Panel: No results for input(s): VITAMINB12, FOLATE, FERRITIN, TIBC, IRON, RETICCTPCT in the last 72 hours. Sepsis Labs: No results for input(s): PROCALCITON, LATICACIDVEN in the last 168 hours.  Recent Results (from the past 240 hour(s))  SARS CORONAVIRUS 2 (TAT 6-24 HRS) Nasopharyngeal Nasopharyngeal Swab     Status: None   Collection Time: 12/17/20  3:22 PM   Specimen: Nasopharyngeal Swab  Result Value Ref Range Status   SARS Coronavirus 2 NEGATIVE NEGATIVE Final    Comment: (NOTE) SARS-CoV-2 target nucleic acids are NOT DETECTED.  The SARS-CoV-2 RNA is generally detectable in upper and lower respiratory specimens during the acute phase of infection. Negative results do not preclude SARS-CoV-2 infection, do not rule out co-infections with other pathogens, and should not be used as the sole basis for treatment or other patient management decisions. Negative  results must be combined with clinical observations, patient history, and epidemiological information. The expected result is Negative.  Fact Sheet for Patients: SugarRoll.be  Fact Sheet for Healthcare Providers: https://www.Aristotle Lieb-mathews.com/  This test is not yet approved or cleared by the [place] FDA and  has been authorized for detection and/or diagnosis of SARS-CoV-2 by FDA under an Emergency Use Authorization (EUA). This EUA will remain  in effect (meaning this test can be used) for the duration of the COVID-19 declaration under Se ction 564(b)(1) of the Act, 21 U.S.C. section 360bbb-3(b)(1), unless the authorization is terminated or revoked sooner.  Performed at [facility], [place]  [address] [place], [place] [address]          Radiology Studies: MR BRAIN WO CONTRAST  Result Date: 12/17/2020 CLINICAL DATA:  Stroke, follow-up. Additional history provided: Headache, dizziness. EXAM: MRI HEAD WITHOUT CONTRAST TECHNIQUE: Multiplanar, multiecho pulse sequences of the brain and surrounding structures were obtained without intravenous contrast. COMPARISON:  Prior head CT examinations 12/17/2020 and earlier. MRI/MRA head 11/15/2017. FINDINGS: Brain: Moderate generalized cerebral atrophy. Comparatively mild cerebellar atrophy. Redemonstrated chronic right frontoparietal cortically based infarcts. Advanced multifocal T2/FLAIR hyperintensity within the cerebral white matter and pons, nonspecific but compatible with chronic small vessel ischemic disease. A small chronic cortical infarct within the left occipital lobe is new as compared to the prior brain MRI of 11/15/2017. Mild chronic hemosiderin deposition and/or cortical laminar necrosis at this site. There are a few scattered supratentorial chronic microhemorrhages, nonspecific. Subcentimeter chronic infarct within the left cerebellar hemisphere, a new finding as compared to the brain  MRI of 11/15/2017. Redemonstrated small chronic infarct within the right cerebellar hemisphere. There is no acute infarct. No evidence of intracranial mass. No extra-axial fluid collection. No midline shift. Vascular: Expected proximal arterial flow voids. Skull and upper cervical spine: No focal marrow lesion. Sinuses/Orbits: Visualized orbits show no acute finding. Bilateral lens replacements. Mild bilateral ethmoid and left sphenoid sinus mucosal thickening. IMPRESSION: No evidence of acute intracranial abnormality. Redemonstrated small chronic cortically-based right frontoparietal infarcts. Small chronic left occipital lobe cortical infarct, a new finding as compared to the brain MRI of 11/15/2017. A small chronic infarct within the left cerebellar hemisphere is new as compared to the prior MRI. Unchanged small chronic infarct within the right cerebellar hemisphere. Stable severe chronic small vessel ischemic changes within the cerebral white matter and pons. Stable moderate cerebral atrophy with comparatively mild cerebellar atrophy. Mild paranasal sinus disease, as described. Electronically Signed   By: Kellie Simmering DO   On: 12/17/2020 16:24   US Carotid Bilateral (at [facility] and [facility] only)  Result Date: 12/17/2020 CLINICAL DATA:  80 year old male with symptoms of cerebrovascular accident and ataxia EXAM: BILATERAL CAROTID DUPLEX ULTRASOUND TECHNIQUE: Pearline Cables scale imaging, color Doppler and duplex ultrasound were performed of bilateral carotid and vertebral arteries in the neck. COMPARISON:  None. FINDINGS: Criteria: Quantification of carotid stenosis is based on velocity parameters that correlate the residual internal carotid diameter with NASCET-based stenosis levels, using the diameter of the distal internal carotid lumen as the denominator for stenosis measurement. The following velocity measurements were obtained: RIGHT ICA: 69/15 cm/sec CCA: 785/88 cm/sec SYSTOLIC ICA/CCA RATIO:  0.3 ECA:  142 cm/sec LEFT  ICA: 477/97 cm/sec CCA: 50/27 cm/sec SYSTOLIC ICA/CCA RATIO:  5.3 ECA:  152 cm/sec RIGHT CAROTID ARTERY: Patent stent in the right distal common carotid artery. Mild heterogeneous atherosclerotic plaque in the proximal internal carotid artery. By peak systolic velocity criteria, the estimated stenosis is less than 50%. RIGHT VERTEBRAL ARTERY:  Patent with normal antegrade flow. LEFT CAROTID ARTERY: Bulky calcified and heterogeneous atherosclerotic plaque in the proximal internal carotid artery. By peak systolic velocity criteria, the estimated stenosis is greater than 70%. There is marked high velocity jetting and spectral broadening in the region of stenosis consistent with turbulent flow. LEFT VERTEBRAL ARTERY:  Patent with normal antegrade flow. IMPRESSION: 1. Severe (70-99%) stenosis proximal left internal carotid artery secondary to bulky, heterogeneous and calcified atherosclerotic plaque. 2. Mild (1-49%) stenosis proximal right internal carotid artery secondary to heterogenous atherosclerotic plaque. 3. Patent right common carotid artery stent. 4. Vertebral arteries are patent with normal antegrade flow.  Signed, Criselda Peaches, MD, [facility] Electronically Signed   By: Jacqulynn Cadet M.D.   On: 12/17/2020 16:09   CT HEAD CODE STROKE WO CONTRAST  Result Date: 12/17/2020 CLINICAL DATA:  Code stroke.  Headache and dizziness EXAM: CT HEAD WITHOUT CONTRAST TECHNIQUE: Contiguous axial images were obtained from the base of the skull through the vertex without intravenous contrast. COMPARISON:  2019 FINDINGS: Brain: No acute intracranial hemorrhage, mass effect, or edema. No new loss of gray-white differentiation. Chronic cortical infarct of the right frontoparietal lobes in the perirolandic region. Questionable new small left cerebellar infarct. Additional patchy and confluent areas of hypoattenuation in the supratentorial white matter is  nonspecific but probably reflects moderate chronic microvascular ischemic changes. Prominence of the ventricles and sulci reflects generalized parenchymal volume loss. No extra-axial fluid collection. Vascular: No hyperdense vessel. There is intracranial atherosclerotic calcification at the skull base Skull: Unremarkable. Sinuses/Orbits: No acute abnormality. Other: Mastoid air cells are clear. ASPECTS ([place] Stroke Program Early CT Score) - Ganglionic level infarction (caudate, lentiform nuclei, internal capsule, insula, M1-M3 cortex): 7 - Supraganglionic infarction (M4-M6 cortex): 3 Total score (0-10 with 10 being normal): 10 IMPRESSION: There is no acute intracranial hemorrhage or evidence of definite acute infarction. ASPECT score is 10. Questionable new small left cerebellar infarct. Chronic right frontoparietal cortical infarct. Moderate chronic microvascular ischemic changes. These results were called by telephone at the time of interpretation on 12/17/2020 at 12:18 pm to provider [facility] , who verbally acknowledged these results. Electronically Signed   By: Macy Mis M.D.   On: 12/17/2020 12:19        Scheduled Meds: . aspirin EC  325 mg Oral Daily  . clopidogrel  75 mg Oral Daily  . ezetimibe  10 mg Oral Daily  . heparin  5,000 Units Subcutaneous Q8H  . pantoprazole  40 mg Oral Daily  . PARoxetine  20 mg Oral QHS  . sodium chloride flush  3 mL Intravenous Once   Continuous Infusions: . sodium chloride    . sodium chloride 60 mL/hr at 12/17/20 1817     LOS: 0 days    Time spent:40 min    Chou Busler, Geraldo Docker, MD [facility]   If 7PM-7AM, please contact night-coverage 12/18/2020, 7:39 AM

## 2020-12-19 DIAGNOSIS — I639 Cerebral infarction, unspecified: Secondary | ICD-10-CM | POA: Diagnosis not present

## 2020-12-19 DIAGNOSIS — I10 Essential (primary) hypertension: Secondary | ICD-10-CM | POA: Diagnosis not present

## 2020-12-19 LAB — COMPREHENSIVE METABOLIC PANEL
ALT: 15 U/L (ref 0–44)
AST: 19 U/L (ref 15–41)
Albumin: 3.7 g/dL (ref 3.5–5.0)
Alkaline Phosphatase: 113 U/L (ref 38–126)
Anion gap: 7 (ref 5–15)
BUN: 22 mg/dL (ref 8–23)
CO2: 25 mmol/L (ref 22–32)
Calcium: 8.9 mg/dL (ref 8.9–10.3)
Chloride: 103 mmol/L (ref 98–111)
Creatinine, Ser: 1.65 mg/dL — ABNORMAL HIGH (ref 0.61–1.24)
GFR, Estimated: 41 mL/min — ABNORMAL LOW (ref >60)
Glucose, Bld: 109 mg/dL — ABNORMAL HIGH (ref 70–99)
Potassium: 4.3 mmol/L (ref 3.5–5.1)
Sodium: 135 mmol/L (ref 135–145)
Total Bilirubin: 0.5 mg/dL (ref 0.3–1.2)
Total Protein: 6.8 g/dL (ref 6.5–8.1)

## 2020-12-19 LAB — CBC WITH DIFFERENTIAL/PLATELET
Abs Immature Granulocytes: 0.03 10*3/uL (ref 0.00–0.07)
Basophils Absolute: 0 10*3/uL (ref 0.0–0.1)
Basophils Relative: 0 %
Eosinophils Absolute: 0.2 10*3/uL (ref 0.0–0.5)
Eosinophils Relative: 2 %
HCT: 38.3 % — ABNORMAL LOW (ref 39.0–52.0)
Hemoglobin: 12.5 g/dL — ABNORMAL LOW (ref 13.0–17.0)
Immature Granulocytes: 0 %
Lymphocytes Relative: 22 %
Lymphs Abs: 1.8 10*3/uL (ref 0.7–4.0)
MCH: 30.9 pg (ref 26.0–34.0)
MCHC: 32.6 g/dL (ref 30.0–36.0)
MCV: 94.6 fL (ref 80.0–100.0)
Monocytes Absolute: 0.8 10*3/uL (ref 0.1–1.0)
Monocytes Relative: 10 %
Neutro Abs: 5.1 10*3/uL (ref 1.7–7.7)
Neutrophils Relative %: 66 %
Platelets: 238 10*3/uL (ref 150–400)
RBC: 4.05 MIL/uL — ABNORMAL LOW (ref 4.22–5.81)
RDW: 13.6 % (ref 11.5–15.5)
WBC: 7.9 10*3/uL (ref 4.0–10.5)
nRBC: 0 % (ref 0.0–0.2)

## 2020-12-19 LAB — LIPID PANEL
Cholesterol: 91 mg/dL (ref 0–200)
HDL: 33 mg/dL — ABNORMAL LOW (ref >40)
LDL Cholesterol: 10 mg/dL (ref 0–99)
Total CHOL/HDL Ratio: 2.8 RATIO
Triglycerides: 239 mg/dL — ABNORMAL HIGH (ref <150)
VLDL: 48 mg/dL — ABNORMAL HIGH (ref 0–40)

## 2020-12-19 LAB — HEMOGLOBIN A1C
Hgb A1c MFr Bld: 6.1 % — ABNORMAL HIGH (ref 4.8–5.6)
Mean Plasma Glucose: 128 mg/dL

## 2020-12-19 LAB — MAGNESIUM: Magnesium: 2.2 mg/dL (ref 1.7–2.4)

## 2020-12-19 LAB — PHOSPHORUS: Phosphorus: 3 mg/dL (ref 2.5–4.6)

## 2020-12-19 MED ORDER — METOPROLOL SUCCINATE ER 25 MG PO TB24: 12.5000 mg | ORAL_TABLET | Freq: Every day | ORAL | 3 refills | Status: DC

## 2020-12-19 MED ORDER — AMLODIPINE BESYLATE 2.5 MG PO TABS: 2.5000 mg | ORAL_TABLET | Freq: Every day | ORAL | 11 refills | Status: DC

## 2020-12-19 NOTE — Discharge Summary (Signed)
Physician Discharge Summary [facility]    Patient: Kenneth [name]                   Admit date: 12/17/2020   DOB: Feb 22, 1941             Discharge date:12/19/2020/11:04 AM XHB:[id number]                          PCP: [name], MD  Disposition: HOME  Recommendations for Outpatient Follow-up:   . Follow up: Vascular surgery team within 1 week.  For right carotid stenosis. . Follow with neurology within 1-2 weeks . Follow-up with PCP in 1-2 weeks  Discharge Condition: Stable   Code Status:   Code Status: Full Code  Diet recommendation: Cardiac diet   Discharge Diagnoses:    Principal Problem:   Acute CVA (cerebrovascular accident) ([place]) Active Problems:   HLD (hyperlipidemia)   GASTROESOPHAGEAL REFLUX DISEASE   DOE (dyspnea on exertion)   Tracheal stenosis   Essential hypertension   Constipation   TIA (transient ischemic attack)   Ischemic stroke ([place])   SOB (shortness of breath)   Tracheostomy dependent (HCC)   NSTEMI (non-ST elevated myocardial infarction) ([place])   Hx of CABG   Statin myopathy   Unstable angina (HCC)   CKD (chronic kidney disease) stage 3, GFR 30-59 ml/min (HCC)   Sinus bradycardia   History of Present Illness/ Hospital Course Kenneth [name] Summary:     Brief Narrative:  Kenneth [name] a 80 y.o.WM PMHx CAD s/p CABG, recent NSTEMI s/p stent placement, currently has 14 day Zio in place sinus bradycardia, HLD (statin intolerance), multiple prior TIA and strokes, CKD stage IIIb, carotid artery disease history of right carotid stent, nephrolithiasis, Hx laryngeal carcinoma now with chronic tracheostomy, depression, GERD,, PVD  .   Presented to ED with symptoms of acute onset of dizziness, unsteady gait, difficulty ambulating and almost fell down. This occurred approximately 30 minutes prior to arrival. He had been feeling well at his baseline prior to the acute symptoms. Fortunately the symptoms rapidly began to improve. He had no  chest pain, no SOB, no headache, no vision change, no fever or chills. He denied having palpitations.   ED Course:Pt was afebrile, HR 52, BP 118/75, temp 98.2, R 17, pulse ox 100% on RA, Na 135, creatinine 1.93, Hg 13.0, CT Head questionable new small left cerebellar infarct, Code stroke called, no TPA given, recommendations were to continue aspirin and plavix and admit for a full CVA work up and inpatient neurology consultation.   Subjective: Was seen and examined today, no focal neurological findings. Stable, no issues overnight... Ready to be discharged home Findings plan of care and discharge were discussed with the patient, his brother and son at the bedside.   Assessment & Plan: Covid vaccination; vaccinated 3/3   Acute CVA versus TIA -Per neurology imaging consistent with remote CVA, presentation most consistent with TIA Recommended dual antiplatelet therapy - Patient appears to have had a small CVA, see MRI brain....  - Patient currently with no neurological deficits on exam. -Cleared by neurology for discharge and follow-up with vascular regarding left ICA stenosis - ASA 325 mg daily + Plavix 75 mg daily - Hemoglobin A1c:6.1 - Lipid panel LDL: 10  - Would like patient to follow-up with neurology in 2 weeks post discharge   Left internal carotid artery stenosis 5/25 US carotid bilateral Severe (70-99%) stenosis proximal left internal carotid artery  secondary to bulky, heterogeneous and calcified atherosclerotic Plaque. -Patient is to continue aspirin Plavix and statins -Patient is referred to outpatient vascular surgery team for further evaluation recommendations Discussed with patient and family at bedside for close follow-up with vascular    CKD stage IIIb -Continue to avoid nephrotoxins, Lab Results  Component Value Date   CREATININE 1.65 (H) 12/19/2020   CREATININE 1.73 (H) 12/18/2020   CREATININE 1.93 (H) 12/17/2020     CAD s/p recent NSTEMI   -He has a newly placed stent -See acute CVA - 5/26 insignificant findings on echocardiogram see results below -Has a 14 day Zio monitor in place by his cardiologist to evaluate for arrhthymias.  -Schedule follow-up appointment with cardiology in 2 to 4 weeks post discharge   Essential hypertension  -Stable resuming BP meds, home Norvasc will be reduced  Sinus bradycardia -Resolved resuming beta-blockers, reducing beta-blocker dose  Tracheostomy dependent - Patient provided Passy-Muir valve today   GERD  - protonix for GI protection.   Obese (BMI 29.54 kg/m)    Code Status: Full Family Communication:  12/19/2020 discussed with the patient's brother and son at the bedside Status is: Inpatient    Dispo: The patient is from: Home        Consultants:  Neurology Cardiology   Procedures/Significant Events:  5/25 CT head code stroke:There is no acute intracranial hemorrhage or evidence of definite acute infarction. ASPECT score is 10.  Questionable new small left cerebellar infarct.  Chronic right frontoparietal cortical infarct. Moderate chronic microvascular ischemic changes. 5/25 MRI brain WO contrast;-No evidence of acute intracranial abnormality. -small chronic cortically-based right frontoparietal infarcts. -Small chronic left occipital lobe cortical infarct, a new finding as compared to the brain MRI of 11/15/2017. -A small chronic infarct within the left cerebellar hemisphere is new as compared to the prior MRI.  -Unchanged small chronic infarct within the right cerebellar hemisphere. -Stable severe chronic small vessel ischemic changes within the cerebral white matter and pons. -Stable moderate cerebral atrophy with comparatively mild cerebellar atrophy. 5/25 US carotid bilateral Severe (70-99%) stenosis proximal left internal carotid artery secondary to bulky, heterogeneous and calcified atherosclerotic plaque. 2. Mild  (1-49%) stenosis proximal right internal carotid artery secondary to heterogenous atherosclerotic plaque. 3. Patent right common carotid artery stent. 4. Vertebral arteries are patent with normal antegrade flow. 5/26 EEG: WNL 5/26 echocardiogram: Left Ventricle: Left ventricular ejection fraction, by estimation, is 65  to 70%. The left ventricle has normal function.       Discharge Instructions:   Discharge Instructions    Activity as tolerated - No restrictions   Complete by: As directed    Diet - low sodium heart healthy   Complete by: As directed    Discharge instructions   Complete by: As directed    Patient instructed to continue aspirin, Plavix, and statins.   Referred to outpatient vascular surgeon team for right carotid stenosis evaluation   Increase activity slowly   Complete by: As directed        Medication List    TAKE these medications   acetaminophen 500 MG tablet Commonly known as: TYLENOL Take 500 mg by mouth every 6 (six) hours as needed.   amLODipine 2.5 MG tablet Commonly known as: NORVASC Take 1 tablet (2.5 mg total) by mouth daily. What changed:   medication strength  how much to take   aspirin EC 81 MG tablet Take 325 mg by mouth daily. Swallow whole.   clopidogrel 75 MG tablet Commonly known as:  PLAVIX Take 1 tablet by mouth once daily   ezetimibe 10 MG tablet Commonly known as: ZETIA Take 1 tablet by mouth once daily   metoprolol succinate 25 MG 24 hr tablet Commonly known as: Toprol XL Take 0.5 tablets (12.5 mg total) by mouth daily.   nitroGLYCERIN 0.4 MG SL tablet Commonly known as: Nitrostat Place 1 tablet (0.4 mg total) under the tongue every 5 (five) minutes as needed for chest pain.   pantoprazole 40 MG tablet Commonly known as: PROTONIX Take 1 tablet (40 mg total) by mouth daily.   PARoxetine 20 MG tablet Commonly known as: PAXIL Take 20 mg by mouth at bedtime.   Praluent 75 MG/ML Soaj Generic drug:  Alirocumab Inject 1 pen into the skin every 14 (fourteen) days.       Allergies  Allergen Reactions  . Nifedipine Other (See Comments)  . Lorazepam Other (See Comments)    REACTION: Alters mental status. "Turns into maniac" REACTION: Alters mental status. "Turns into maniac" REACTION: Alters mental status. "Turns into maniac"  . Other     REACTION: Unknown to patient. States that MD states allergy per medical records  . Penicillins Hives    Has patient had a PCN reaction causing immediate rash, facial/tongue/throat swelling, SOB or lightheadedness with hypotension: Yes Has patient had a PCN reaction causing severe rash involving mucus membranes or skin necrosis: No Has patient had a PCN reaction that required hospitalization No Has patient had a PCN reaction occurring within the last 10 years: No If all of the above answers are "NO", then may proceed with Cephalosporin use.  HAS TOLERATED: cephalexin  . Sulfacetamide     REACTION: Unknown to patient. States that MD states allergy per medical records  . Sulfonamide Derivatives Other (See Comments)    REACTION: Unknown to patient. States that MD states allergy per medical records  . Erythromycin Rash  . Statins Itching and Rash    Rash on his trunk and arms, swelling in his lips  . Vancomycin Itching     Procedures /Studies:   MR BRAIN WO CONTRAST  Result Date: 12/17/2020 CLINICAL DATA:  Stroke, follow-up. Additional history provided: Headache, dizziness. EXAM: MRI HEAD WITHOUT CONTRAST TECHNIQUE: Multiplanar, multiecho pulse sequences of the brain and surrounding structures were obtained without intravenous contrast. COMPARISON:  Prior head CT examinations 12/17/2020 and earlier. MRI/MRA head 11/15/2017. FINDINGS: Brain: Moderate generalized cerebral atrophy. Comparatively mild cerebellar atrophy. Redemonstrated chronic right frontoparietal cortically based infarcts. Advanced multifocal T2/FLAIR hyperintensity within the cerebral  white matter and pons, nonspecific but compatible with chronic small vessel ischemic disease. A small chronic cortical infarct within the left occipital lobe is new as compared to the prior brain MRI of 11/15/2017. Mild chronic hemosiderin deposition and/or cortical laminar necrosis at this site. There are a few scattered supratentorial chronic microhemorrhages, nonspecific. Subcentimeter chronic infarct within the left cerebellar hemisphere, a new finding as compared to the brain MRI of 11/15/2017. Redemonstrated small chronic infarct within the right cerebellar hemisphere. There is no acute infarct. No evidence of intracranial mass. No extra-axial fluid collection. No midline shift. Vascular: Expected proximal arterial flow voids. Skull and upper cervical spine: No focal marrow lesion. Sinuses/Orbits: Visualized orbits show no acute finding. Bilateral lens replacements. Mild bilateral ethmoid and left sphenoid sinus mucosal thickening. IMPRESSION: No evidence of acute intracranial abnormality. Redemonstrated small chronic cortically-based right frontoparietal infarcts. Small chronic left occipital lobe cortical infarct, a new finding as compared to the brain MRI of 11/15/2017. A small chronic infarct  within the left cerebellar hemisphere is new as compared to the prior MRI. Unchanged small chronic infarct within the right cerebellar hemisphere. Stable severe chronic small vessel ischemic changes within the cerebral white matter and pons. Stable moderate cerebral atrophy with comparatively mild cerebellar atrophy. Mild paranasal sinus disease, as described. Electronically Signed   By: Kellie Simmering DO   On: 12/17/2020 16:24   CARDIAC CATHETERIZATION  Result Date: 11/26/2020  Previously placed Prox RCA stent (unknown type) is widely patent.  Ost RCA lesion is 30% stenosed.  Mid RCA lesion is 95% stenosed.  Ost LM to Mid LM lesion is 80% stenosed.  Ost Cx to Prox Cx lesion is 95% stenosed.  Post intervention,  there is a 0% residual stenosis.  Ost LAD to Prox LAD lesion is 80% stenosed.  Prox LAD lesion is 90% stenosed.  2nd Diag lesion is 80% stenosed.  RV Branch lesion is 80% stenosed.  Severe native CAD with previously noted high-grade stenoses in the left main, proximal and mid LAD,and proximal circumflex. Mild 30% proximal narrowing in the RCA prior to the proximal patent  stent but evidence for 95 to 99% in-stent restenosis in the mid RCA stent. Patent sequential LIMA graft supplying the diagonal 1 and mid LAD. Patent RIMA graft supplying the OM vessel. Successful PCI to the mid RCA stent utilizing Wolverine cutting balloon 2.0 x 10 mm, 3.0 x 15 mm and ultimate 3.25 x 15 mm noncompliant balloon dilatations with the 95% stenosis being reduced to 0%. LVEDP 21 mm Hg. RECOMMENDATION: Continue DAPT indefinitely.  Medical therapy for concomitant CAD.  Continue aggressive lipid-lowering therapy with target LDL less than 70 and optimal blood pressure control.   US Carotid Bilateral (at [facility] and [facility] only)  Result Date: 12/17/2020 CLINICAL DATA:  80 year old male with symptoms of cerebrovascular accident and ataxia EXAM: BILATERAL CAROTID DUPLEX ULTRASOUND TECHNIQUE: Pearline Cables scale imaging, color Doppler and duplex ultrasound were performed of bilateral carotid and vertebral arteries in the neck. COMPARISON:  None. FINDINGS: Criteria: Quantification of carotid stenosis is based on velocity parameters that correlate the residual internal carotid diameter with NASCET-based stenosis levels, using the diameter of the distal internal carotid lumen as the denominator for stenosis measurement. The following velocity measurements were obtained: RIGHT ICA: 69/15 cm/sec CCA: 923/30 cm/sec SYSTOLIC ICA/CCA RATIO:  0.3 ECA:  142 cm/sec LEFT ICA: 477/97 cm/sec CCA: 07/62 cm/sec SYSTOLIC ICA/CCA RATIO:  5.3 ECA:  152 cm/sec RIGHT CAROTID ARTERY: Patent stent in the right distal common carotid artery. Mild heterogeneous atherosclerotic  plaque in the proximal internal carotid artery. By peak systolic velocity criteria, the estimated stenosis is less than 50%. RIGHT VERTEBRAL ARTERY:  Patent with normal antegrade flow. LEFT CAROTID ARTERY: Bulky calcified and heterogeneous atherosclerotic plaque in the proximal internal carotid artery. By peak systolic velocity criteria, the estimated stenosis is greater than 70%. There is marked high velocity jetting and spectral broadening in the region of stenosis consistent with turbulent flow. LEFT VERTEBRAL ARTERY:  Patent with normal antegrade flow. IMPRESSION: 1. Severe (70-99%) stenosis proximal left internal carotid artery secondary to bulky, heterogeneous and calcified atherosclerotic plaque. 2. Mild (1-49%) stenosis proximal right internal carotid artery secondary to heterogenous atherosclerotic plaque. 3. Patent right common carotid artery stent. 4. Vertebral arteries are patent with normal antegrade flow. Signed, Criselda Peaches, MD, [facility] Electronically Signed   By: Jacqulynn Cadet M.D.   On: 12/17/2020 16:09   DG Chest [facility] 1 View  Result  Date: 11/24/2020 CLINICAL DATA:  Chest pain. EXAM: PORTABLE CHEST 1 VIEW COMPARISON:  June 04, 2020 FINDINGS: Tracheostomy catheter tip is 5.0 cm above the carina. Areas of scarring noted in each mid lung and right lower lung region. No edema or airspace opacity. Heart is upper normal in size with pulmonary vascularity normal. Patient is status post internal mammary bypass grafting. No adenopathy. No bone lesions. There is a stent in the right carotid artery region. IMPRESSION: Tracheostomy as described without pneumothorax. Scarring bilaterally, stable, without edema or airspace opacity. Stable cardiac silhouette. Postoperative changes noted. Electronically Signed   By: Lowella Grip III M.D.   On: 11/24/2020 08:47   EEG adult  Result Date: 12/18/2020 Lora Havens, MD      12/18/2020  3:20 PM Patient Name: Kenneth [name] MRN: [id number] Epilepsy Attending: Lora Havens Referring Physician/Provider: Dr Irwin Brakeman Date: 12/18/2020 Duration: 22.43 mins Patient history: 80yo M with transient dizziness. EEG to evaluate for seizure Level of alertness: Awake AEDs during EEG study: None Technical aspects: This EEG study was done with scalp electrodes positioned according to the 10-20 International system of electrode placement. Electrical activity was acquired at a sampling rate of 500Hz  and reviewed with a high frequency filter of 70Hz  and a low frequency filter of 1Hz . EEG data were recorded continuously and digitally stored. Description: The posterior dominant rhythm consists of 9-10 Hz activity of moderate voltage (25-35 uV) seen predominantly in posterior head regions, symmetric and reactive to eye opening and eye closing. Hyperventilation and photic stimulation were not performed.   IMPRESSION: This study is within normal limits. No seizures or epileptiform discharges were seen throughout the recording. Lora Havens   ECHOCARDIOGRAM COMPLETE  Result Date: 12/18/2020    ECHOCARDIOGRAM REPORT   Patient Name:   Kenneth [name] Date of Exam: 12/18/2020 Medical Rec #:  [id number]        Height:       66.0 in Accession #:    [id number]       Weight:       183.0 lb Date of Birth:  [birth year]        BSA:          1.926 m Patient Age:    46 years         BP:           131/61 mmHg Patient Gender: M                HR:           58 bpm. Exam Location:  [facility] Procedure: 2D Echo, Cardiac Doppler and Color Doppler Indications:    Stroke I63.9  History:        Patient has prior history of Echocardiogram examinations, most                 recent 06/06/2020. Previous Myocardial Infarction, Prior CABG;                 Risk Factors:Hypertension and Dyslipidemia. CAD S/P percutaneous                 coronary angioplasty, CVA (cerebral vascular accident) ([place])                 (From Hx),  Tobacco abuse (From Hx), Laryngeal carcinoma ([place])                 (From Hx).  Sonographer:    Alvino Chapel RCS Referring Phys:  Gregory Left ventricular ejection fraction, by estimation, is 65 to 70%. The left ventricle has normal function. The left ventricle has no regional wall motion abnormalities. Left ventricular diastolic parameters were normal.  2. Right ventricular systolic function is normal. The right ventricular size is normal. There is normal pulmonary artery systolic pressure. The estimated right ventricular systolic pressure is 57.8 mmHg.  3. Left atrial size was mildly dilated.  4. The mitral valve is grossly normal. Trivial mitral valve regurgitation.  5. The aortic valve is tricuspid. There is moderate calcification of the aortic valve. Aortic valve regurgitation is not visualized. Mild to moderate aortic valve sclerosis/calcification is present, without any evidence of aortic stenosis. Aortic valve mean gradient measures 7.0 mmHg.  6. The inferior vena cava is normal in size with greater than 50% respiratory variability, suggesting right atrial pressure of 3 mmHg. FINDINGS  Left Ventricle: Left ventricular ejection fraction, by estimation, is 65 to 70%. The left ventricle has normal function. The left ventricle has no regional wall motion abnormalities. The left ventricular internal cavity size was normal in size. There is  no left ventricular hypertrophy. Left ventricular diastolic parameters were normal. Right Ventricle: The right ventricular size is normal. No increase in right ventricular wall thickness. Right ventricular systolic function is normal. There is normal pulmonary artery systolic pressure. The tricuspid regurgitant velocity is 2.16 m/s, and  with an assumed right atrial pressure of 3 mmHg, the estimated right ventricular systolic pressure is 46.9 mmHg. Left Atrium: Left atrial size was mildly dilated. Right Atrium: Right atrial size was normal in  size. Pericardium: There is no evidence of pericardial effusion. Mitral Valve: The mitral valve is grossly normal. Mild mitral annular calcification. Trivial mitral valve regurgitation. Tricuspid Valve: The tricuspid valve is grossly normal. Tricuspid valve regurgitation is trivial. Aortic Valve: The aortic valve is tricuspid. There is moderate calcification of the aortic valve. There is moderate aortic valve annular calcification. Aortic valve regurgitation is not visualized. Mild to moderate aortic valve sclerosis/calcification is  present, without any evidence of aortic stenosis. Aortic valve mean gradient measures 7.0 mmHg. Aortic valve peak gradient measures 16.7 mmHg. Aortic valve area, by VTI measures 2.09 cm. Pulmonic Valve: The pulmonic valve was grossly normal. Pulmonic valve regurgitation is trivial. Aorta: The aortic root is normal in size and structure. Venous: The inferior vena cava is normal in size with greater than 50% respiratory variability, suggesting right atrial pressure of 3 mmHg. IAS/Shunts: The interatrial septum appears to be lipomatous. No atrial level shunt detected by color flow Doppler.  LEFT VENTRICLE PLAX 2D LVIDd:         4.50 cm  Diastology LVIDs:         2.60 cm  LV e' medial:    5.33 cm/s LV PW:         1.00 cm  LV E/e' medial:  16.2 LV IVS:        0.90 cm  LV e' lateral:   12.70 cm/s LVOT diam:     1.90 cm  LV E/e' lateral: 6.8 LV SV:         83 LV SV Index:   43 LVOT Area:     2.84 cm  RIGHT VENTRICLE RV S prime:     10.40 cm/s TAPSE (M-mode): 1.8 cm LEFT ATRIUM             Index       RIGHT ATRIUM  Index LA diam:        4.10 cm 2.13 cm/m  RA Area:     14.00 cm LA Vol (A2C):   63.3 ml 32.87 ml/m RA Volume:   30.50 ml  15.84 ml/m LA Vol (A4C):   71.9 ml 37.34 ml/m LA Biplane Vol: 73.0 ml 37.91 ml/m  AORTIC VALVE AV Area (Vmax):    2.02 cm AV Area (Vmean):   2.09 cm AV Area (VTI):     2.09 cm AV Vmax:           204.50 cm/s AV Vmean:          124.000 cm/s AV  VTI:            0.398 m AV Peak Grad:      16.7 mmHg AV Mean Grad:      7.0 mmHg LVOT Vmax:         146.00 cm/s LVOT Vmean:        91.400 cm/s LVOT VTI:          0.293 m LVOT/AV VTI ratio: 0.74  AORTA Ao Root diam: 3.50 cm MITRAL VALVE               TRICUSPID VALVE MV Area (PHT): 3.03 cm    TR Peak grad:   18.7 mmHg MV Decel Time: 250 msec    TR Vmax:        216.00 cm/s MV E velocity: 86.50 cm/s MV A velocity: 81.80 cm/s  SHUNTS MV E/A ratio:  1.06        Systemic VTI:  0.29 m                            Systemic Diam: 1.90 cm Rozann Lesches MD Electronically signed by Rozann Lesches MD Signature Date/Time: 12/18/2020/11:48:53 AM    Final    CT HEAD CODE STROKE WO CONTRAST  Result Date: 12/17/2020 CLINICAL DATA:  Code stroke.  Headache and dizziness EXAM: CT HEAD WITHOUT CONTRAST TECHNIQUE: Contiguous axial images were obtained from the base of the skull through the vertex without intravenous contrast. COMPARISON:  2019 FINDINGS: Brain: No acute intracranial hemorrhage, mass effect, or edema. No new loss of gray-white differentiation. Chronic cortical infarct of the right frontoparietal lobes in the perirolandic region. Questionable new small left cerebellar infarct. Additional patchy and confluent areas of hypoattenuation in the supratentorial white matter is nonspecific but probably reflects moderate chronic microvascular ischemic changes. Prominence of the ventricles and sulci reflects generalized parenchymal volume loss. No extra-axial fluid collection. Vascular: No hyperdense vessel. There is intracranial atherosclerotic calcification at the skull base Skull: Unremarkable. Sinuses/Orbits: No acute abnormality. Other: Mastoid air cells are clear. ASPECTS ([place] Stroke Program Early CT Score) - Ganglionic level infarction (caudate, lentiform nuclei, internal capsule, insula, M1-M3 cortex): 7 - Supraganglionic infarction (M4-M6 cortex): 3 Total score (0-10 with 10 being normal): 10 IMPRESSION: There is no  acute intracranial hemorrhage or evidence of definite acute infarction. ASPECT score is 10. Questionable new small left cerebellar infarct. Chronic right frontoparietal cortical infarct. Moderate chronic microvascular ischemic changes. These results were called by telephone at the time of interpretation on 12/17/2020 at 12:18 pm to provider [facility] , who verbally acknowledged these results. Electronically Signed   By: Macy Mis M.D.   On: 12/17/2020 12:19    Subjective:   Patient was seen and examined 12/19/2020, 11:04 AM Patient stable today. No acute distress.  No issues overnight Stable  for discharge.  Discharge Exam:    Vitals:   12/18/20 2309 12/19/20 0405 12/19/20 0555 12/19/20 1030  BP:  118/68  125/70  Pulse:  68 71 70  Resp:  17 18 18   Temp:  98.5 F (36.9 C)  98.2 F (36.8 C)  TempSrc:  Oral  Oral  SpO2: 95% 96% 96% 98%  Weight:      Height:        General: Pt lying comfortably in bed & appears in no obvious distress. Cardiovascular: S1 & S2 heard, RRR, S1/S2 +. No murmurs, rubs, gallops or clicks. No JVD or pedal edema. Respiratory: Clear to auscultation without wheezing, rhonchi or crackles. No increased work of breathing. Abdominal:  Non-distended, non-tender & soft. No organomegaly or masses appreciated. Normal bowel sounds heard. CNS: Alert and oriented. No focal deficits. Extremities: no edema, no cyanosis      The results of significant diagnostics from this hospitalization (including imaging, microbiology, ancillary and laboratory) are listed below for reference.      Microbiology:   Recent Results (from the past 240 hour(s))  SARS CORONAVIRUS 2 (TAT 6-24 HRS) Nasopharyngeal Nasopharyngeal Swab     Status: None   Collection Time: 12/17/20  3:22 PM   Specimen: Nasopharyngeal Swab  Result Value Ref Range Status   SARS Coronavirus 2 NEGATIVE NEGATIVE Final    Comment: (NOTE) SARS-CoV-2 target nucleic acids are NOT DETECTED.  The SARS-CoV-2 RNA  is generally detectable in upper and lower respiratory specimens during the acute phase of infection. Negative results do not preclude SARS-CoV-2 infection, do not rule out co-infections with other pathogens, and should not be used as the sole basis for treatment or other patient management decisions. Negative results must be combined with clinical observations, patient history, and epidemiological information. The expected result is Negative.  Fact Sheet for Patients: SugarRoll.be  Fact Sheet for Healthcare Providers: https://www.woods-mathews.com/  This test is not yet approved or cleared by the [place] FDA and  has been authorized for detection and/or diagnosis of SARS-CoV-2 by FDA under an Emergency Use Authorization (EUA). This EUA will remain  in effect (meaning this test can be used) for the duration of the COVID-19 declaration under Se ction 564(b)(1) of the Act, 21 U.S.C. section 360bbb-3(b)(1), unless the authorization is terminated or revoked sooner.  Performed at [facility], [place] [address] [place], [place] [address]      Labs:   CBC: Recent Labs  Lab 12/17/20 1209 12/19/20 0637  WBC 8.4 7.9  NEUTROABS 5.3 5.1  HGB 13.0 12.5*  HCT 40.7 38.3*  MCV 96.2 94.6  PLT 283 720   Basic Metabolic Panel: Recent Labs  Lab 12/12/20 1511 12/16/20 1439 12/17/20 1209 12/18/20 0623 12/19/20 0637  NA 136 134* 135 137 135  K 5.3* 5.4* 4.8 5.3* 4.3  CL 104 101 101 103 103  CO2 22 27 27 28 25   GLUCOSE 91 82 82 111* 109*  BUN 27* 20 24* 25* 22  CREATININE 2.13* 1.77* 1.93* 1.73* 1.65*  CALCIUM 8.6* 8.8* 9.1 9.1 8.9  MG  --   --   --  2.2 2.2  PHOS  --   --   --   --  3.0   Liver Function Tests: Recent Labs  Lab 12/17/20 1209 12/19/20 0637  AST 16 19  ALT 16 15  ALKPHOS 122 113  BILITOT 0.6 0.5  PROT 7.7 6.8  ALBUMIN 4.2 3.7   BNP (last 3 results) No results for  input(s): BNP in the last 8760  hours. Cardiac Enzymes: No results for input(s): CKTOTAL, CKMB, CKMBINDEX, TROPONINI in the last 168 hours. CBG: Recent Labs  Lab 12/17/20 1238  GLUCAP 110*   Hgb A1c Recent Labs    12/17/20 1209  HGBA1C 6.1*   Lipid Profile Recent Labs    12/18/20 0623 12/19/20 0637  CHOL 88 91  HDL 33* 33*  LDLCALC 14 10  TRIG 205* 239*  CHOLHDL 2.7 2.8   Thyroid function studies No results for input(s): TSH, T4TOTAL, T3FREE, THYROIDAB in the last 72 hours.  Invalid input(s): FREET3 Anemia work up No results for input(s): VITAMINB12, FOLATE, FERRITIN, TIBC, IRON, RETICCTPCT in the last 72 hours. Urinalysis    Component Value Date/Time   COLORURINE YELLOW 10/13/2018 1023   APPEARANCEUR CLEAR 10/13/2018 1023   LABSPEC 1.010 10/13/2018 1023   PHURINE 8.0 10/13/2018 1023   GLUCOSEU NEGATIVE 10/13/2018 1023   GLUCOSEU neg 06/01/2010 1113   HGBUR NEGATIVE 10/13/2018 [place] 10/13/2018 [place] 10/13/2018 1023   PROTEINUR NEGATIVE 10/13/2018 1023   NITRITE NEGATIVE 10/13/2018 1023   LEUKOCYTESUR NEGATIVE 10/13/2018 1023         Time coordinating discharge: Over 45 minutes  SIGNED: Deatra James, MD, FACP, FHM. [facility],  Please use [email or web address] to Page If 7PM-7AM, please contact night-coverage [email or web address], Password [facility] 12/19/2020, 11:04 AM

## 2020-12-23 ENCOUNTER — Other Ambulatory Visit: Payer: Self-pay

## 2020-12-23 ENCOUNTER — Ambulatory Visit: Payer: Medicare HMO | Admitting: Vascular Surgery

## 2020-12-23 ENCOUNTER — Encounter: Payer: Self-pay | Admitting: Vascular Surgery

## 2020-12-23 VITALS — BP 149/71 | HR 55 | Temp 97.9°F | Resp 20 | Ht 66.0 in | Wt 180.0 lb

## 2020-12-23 DIAGNOSIS — I6523 Occlusion and stenosis of bilateral carotid arteries: Secondary | ICD-10-CM | POA: Diagnosis not present

## 2020-12-23 NOTE — Progress Notes (Signed)
[facility] OF [place]  ASSESSMENT / PLAN: Kenneth [name] is a 80 y.o. 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  Given his age, transfemoral stenting carries a high risk of periprocedural stroke and should not be approached lightly.  We will check a CTA of his head and neck if his renal function is adequate.  Would only intervene if his stenosis is critical.  The patient should continue best medical therapy for carotid artery stenosis including: Complete cessation from all tobacco products. Blood glucose control with goal A1c < 7%. Blood pressure control with goal blood pressure < 140/90 mmHg. Lipid reduction therapy with goal LDL-C <100 mg/dL (<70 if symptomatic from carotid artery stenosis).  Aspirin 81mg  PO QD.  Clopidogrel 75mg  PO QD. Atorvastatin 40-80mg  PO QD (or other "high intensity" statin therapy).  Follow-up after CTA.  CHIEF COMPLAINT: TIA  HISTORY OF PRESENT ILLNESS: Kenneth [name] is a 80 y.o. male referred to clinic for evaluation after recent TIA.  Patient reports episode of dizziness and instability 12/17/2020 which prompted his presentation to the hospital.  Stroke work-up was initiated.  Remote infarct was identified on MR of the brain.  Patient's symptoms resolved.  Duplex of the carotid arteries revealed patent right carotid stenting and severe left internal carotid artery stenosis.  Neurology evaluation confirmed that the stenosis was likely asymptomatic.  Vascular surgery follow-up was recommended for discussion of revascularization.  On my evaluation today, the patient is asymptomatic from a neurologic standpoint.  He did have an additional episode over the past weekend which was thankfully limited.  He reports no symptoms  attributable to his left carotid artery stenosis, specifically denying amaurosis, facial droop, unilateral weakness, difficulty speaking, difficulty swallowing.  He suffered a MI and required a coronary stent 11/26/20.   Past Medical History:  Diagnosis Date  . Acute ST elevation myocardial infarction (STEMI) of inferior wall [facility]) 03/01/2020   [facility] [address]  . Anxiety   . 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 Carotid artery disease ([place])    a. h/o R carotid stent.  . Chronic lower back pain    "have it at night" (05/04/2018)  . CKD (chronic kidney disease), stage III ([place])   . Coronary artery disease    s/p CABG 2003, inferior STEMI 02/2020 s/p DES to RCA, NSTEMI 06/05/20 with DES to RCA, NSTEMI 11/2020 s/p PTCA to [facility]  . CVA (cerebral vascular accident) ([place]) 10/2017   "little numb on my left face since" (05/04/2018)  . Depression   . GERD (gastroesophageal reflux disease)   . High triglycerides   . History of kidney stones   . Hypertension   . 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 Peripheral vascular disease ([place])   . Sinus bradycardia   . Stroke [facility])    "he's had several little strokes; many that he wasn't aware of" (05/04/2018)  . Tobacco abuse   . Tracheal stenosis     Past Surgical History:  Procedure Laterality Date  . CAROTID STENT Right 06-13-12; 05/04/2018  . CAROTID STENT INSERTION N/A 06/13/2012   Procedure: CAROTID STENT INSERTION;  Surgeon: Serafina Mitchell, MD;  Location: [facility] CATH LAB;  Service: Cardiovascular;  Laterality: N/A;  . CATARACT EXTRACTION, BILATERAL Bilateral   . COLONOSCOPY  11/10/2011   Dr. [name]: hemorrhoids,  tubular adenoma  . CORNEAL TRANSPLANT Bilateral   . CORONARY ARTERY BYPASS GRAFT  ~ 2003   "CABG X3"  . CORONARY BALLOON ANGIOPLASTY N/A 11/26/2020   Procedure: CORONARY BALLOON ANGIOPLASTY;  Surgeon: Troy Sine, MD;  Location: [place] CV LAB;  Service: Cardiovascular;  Laterality: N/A;  . CORONARY STENT INTERVENTION N/A  06/05/2020   Procedure: CORONARY STENT INTERVENTION;  Surgeon: Lorretta Harp, MD;  Location: [place] CV LAB;  Service: Cardiovascular;  Laterality: N/A;  . EYE SURGERY    . INSERTION OF RETROGRADE CAROTID STENT Right 05/04/2018   Procedure: INSERTION OF RIGHT  CAROTID STENT using an ABBOT- XACT carotid stent system;  Surgeon: Serafina Mitchell, MD;  Location: [place];  Service: Vascular;  Laterality: Right;  . LAPAROSCOPIC CHOLECYSTECTOMY  2007  . LEFT HEART CATH AND CORS/GRAFTS ANGIOGRAPHY N/A 10/07/2016   Procedure: Left Heart Cath and Cors/Grafts Angiography;  Surgeon: Troy Sine, MD;  Location: [place] CV LAB;  Service: Cardiovascular;  Laterality: N/A;  . LEFT HEART CATH AND CORS/GRAFTS ANGIOGRAPHY N/A 06/05/2020   Procedure: LEFT HEART CATH AND CORS/GRAFTS ANGIOGRAPHY;  Surgeon: Lorretta Harp, MD;  Location: [place] CV LAB;  Service: Cardiovascular;  Laterality: N/A;  . LEFT HEART CATH AND CORS/GRAFTS ANGIOGRAPHY N/A 11/26/2020   Procedure: LEFT HEART CATH AND CORS/GRAFTS ANGIOGRAPHY;  Surgeon: Troy Sine, MD;  Location: [place] CV LAB;  Service: Cardiovascular;  Laterality: N/A;  . PARTIAL LARYNGECTOMY  1998  . TRACHEOSTOMY  03/13/2018   "@ [facility]"    Family History  Problem Relation Age of Onset  . Dementia Mother   . Heart disease Father   . Cancer Brother   . Heart disease Brother   . Hyperlipidemia Brother   . Cancer Brother   . Colon cancer Neg Hx     Social History   Socioeconomic History  . Marital status: Married    Spouse name: Not on file  . Number of children: Not on file  . Years of education: Not on file  . Highest education level: Not on file  Occupational History  . Occupation: retired    Comment: Warden/ranger  Tobacco Use  . Smoking status: Former Smoker    Packs/day: 1.00    Years: 15.00    Pack years: 15.00    Types: Cigarettes    Start date: 04/22/1953    Quit date: 07/26/1970    Years since quitting: 50.4  .  Smokeless tobacco: Former Systems developer    Types: Chew    Quit date: 05/05/2002  Vaping Use  . Vaping Use: Never used  Substance and Sexual Activity  . Alcohol use: Never    Alcohol/week: 0.0 standard drinks  . Drug use: Never  . Sexual activity: Not Currently  Other Topics Concern  . Not on file  Social History Narrative   Walks on the treadmill every other day at the [facility] for the last couple of weeks.         Social Determinants of Health   Financial Resource Strain: Not on file  Food Insecurity: Not on file  Transportation Needs: Not on file  Physical Activity: Not on file  Stress: Not on file  Social Connections: Not on file  Intimate Partner Violence: Not on file    Allergies  Allergen Reactions  . Nifedipine Other (See Comments)  . Lorazepam Other (See Comments)    REACTION: Alters mental status. "Turns into maniac" REACTION: Alters mental status. "Turns into maniac" REACTION:  Alters mental status. "Turns into maniac"  . Other     REACTION: Unknown to patient. States that MD states allergy per medical records  . Penicillins Hives    Has patient had a PCN reaction causing immediate rash, facial/tongue/throat swelling, SOB or lightheadedness with hypotension: Yes Has patient had a PCN reaction causing severe rash involving mucus membranes or skin necrosis: No Has patient had a PCN reaction that required hospitalization No Has patient had a PCN reaction occurring within the last 10 years: No If all of the above answers are "NO", then may proceed with Cephalosporin use.  HAS TOLERATED: cephalexin  . Sulfacetamide     REACTION: Unknown to patient. States that MD states allergy per medical records  . Sulfonamide Derivatives Other (See Comments)    REACTION: Unknown to patient. States that MD states allergy per medical records  . Erythromycin Rash  . Statins Itching and Rash    Rash on his trunk and arms, swelling in his lips  . Vancomycin Itching    Current Outpatient  Medications  Medication Sig Dispense Refill  . acetaminophen (TYLENOL) 500 MG tablet Take 500 mg by mouth every 6 (six) hours as needed.    . Alirocumab (PRALUENT) 75 MG/ML SOAJ Inject 1 pen into the skin every 14 (fourteen) days. 2 mL 11  . amLODipine (NORVASC) 2.5 MG tablet Take 1 tablet (2.5 mg total) by mouth daily. 30 tablet 11  . aspirin EC 81 MG tablet Take 325 mg by mouth daily. Swallow whole.    . clopidogrel (PLAVIX) 75 MG tablet Take 1 tablet by mouth once daily 90 tablet 3  . ezetimibe (ZETIA) 10 MG tablet Take 1 tablet by mouth once daily 90 tablet 1  . metoprolol succinate (TOPROL XL) 25 MG 24 hr tablet Take 0.5 tablets (12.5 mg total) by mouth daily. 45 tablet 3  . nitroGLYCERIN (NITROSTAT) 0.4 MG SL tablet Place 1 tablet (0.4 mg total) under the tongue every 5 (five) minutes as needed for chest pain. 25 tablet 3  . pantoprazole (PROTONIX) 40 MG tablet Take 1 tablet (40 mg total) by mouth daily. 90 tablet 3  . PARoxetine (PAXIL) 20 MG tablet Take 20 mg by mouth at bedtime.     No current facility-administered medications for this visit.    REVIEW OF SYSTEMS:  [X]  denotes positive finding, [ ]  denotes negative finding Cardiac  Comments:  Chest pain or chest pressure:    Shortness of breath upon exertion:    Short of breath when lying flat:    Irregular heart rhythm:        Vascular    Pain in calf, thigh, or hip brought on by ambulation:    Pain in feet at night that wakes you up from your sleep:     Blood clot in your veins:    Leg swelling:         Pulmonary    Oxygen at home:    Productive cough:     Wheezing:         Neurologic    Sudden weakness in arms or legs:     Sudden numbness in arms or legs:     Sudden onset of difficulty speaking or slurred speech:    Temporary loss of vision in one eye:     Problems with dizziness:         Gastrointestinal    Blood in stool:     Vomited blood:  Genitourinary    Burning when urinating:     Blood in  urine:        Psychiatric    Major depression:         Hematologic    Bleeding problems:    Problems with blood clotting too easily:        Skin    Rashes or ulcers:        Constitutional    Fever or chills:      PHYSICAL EXAM  Vitals:   12/23/20 0817  BP: (!) 150/73  Pulse: (!) 55  Resp: 20  Temp: 97.9 F (36.6 C)  Weight: 180 lb (81.6 kg)  Height: 5\' 6"  (1.676 m)    Constitutional: elderly, chronically ill appearing. no distress. Appears well nourished.  Neurologic: CN intact. no focal findings. no sensory loss. Psychiatric: Mood and affect symmetric and appropriate. Eyes: No icterus. No conjunctival pallor. Ears, nose, throat: mucous membranes moist. Tracheostomy with passy-muir valve. Cardiac: regular rate and rhythm.  Respiratory: unlabored. Abdominal: soft, non-tender, non-distended.  Extremity: No edema. No cyanosis. No pallor.  Skin: No gangrene. No ulceration.  Lymphatic: No Stemmer's sign. No palpable lymphadenopathy.  PERTINENT LABORATORY AND RADIOLOGIC DATA  Most recent CBC CBC Latest Ref Rng & Units 12/19/2020 12/17/2020 11/27/2020  WBC 4.0 - 10.5 K/uL 7.9 8.4 8.9  Hemoglobin 13.0 - 17.0 g/dL 12.5(L) 13.0 12.6(L)  Hematocrit 39.0 - 52.0 % 38.3(L) 40.7 39.2  Platelets 150 - 400 K/uL 238 283 214     Most recent CMP CMP Latest Ref Rng & Units 12/19/2020 12/18/2020 12/17/2020  Glucose 70 - 99 mg/dL 109(H) 111(H) 82  BUN 8 - 23 mg/dL 22 25(H) 24(H)  Creatinine 0.61 - 1.24 mg/dL 1.65(H) 1.73(H) 1.93(H)  Sodium 135 - 145 mmol/L 135 137 135  Potassium 3.5 - 5.1 mmol/L 4.3 5.3(H) 4.8  Chloride 98 - 111 mmol/L 103 103 101  CO2 22 - 32 mmol/L 25 28 27   Calcium 8.9 - 10.3 mg/dL 8.9 9.1 9.1  Total Protein 6.5 - 8.1 g/dL 6.8 - 7.7  Total Bilirubin 0.3 - 1.2 mg/dL 0.5 - 0.6  Alkaline Phos 38 - 126 U/L 113 - 122  AST 15 - 41 U/L 19 - 16  ALT 0 - 44 U/L 15 - 16    Renal function Estimated Creatinine Clearance: 35.2 mL/min (A) (by C-G formula based on SCr of  1.65 mg/dL (H)).  Hgb A1c MFr Bld (%)  Date Value  12/19/2020 6.1 (H)    LDL Chol Calc (NIH)  Date Value Ref Range Status  09/26/2020 44 0 - 99 mg/dL Final   LDL Cholesterol  Date Value Ref Range Status  12/19/2020 10 0 - 99 mg/dL Final    Comment:           Total Cholesterol/HDL:CHD Risk Coronary Heart Disease Risk Table                     Men   Women  1/2 Average Risk   3.4   3.3  Average Risk       5.0   4.4  2 X Average Risk   9.6   7.1  3 X Average Risk  23.4   11.0        Use the calculated Patient Ratio above and the CHD Risk Table to determine the patient's CHD Risk.        ATP III CLASSIFICATION (LDL):  <100     mg/dL  Optimal  100-129  mg/dL   Near or Above                    Optimal  130-159  mg/dL   Borderline  160-189  mg/dL   High  >190     mg/dL   Very High Performed at [facility], [address] [place], [place] [address]      Vascular Imaging: Reviewed duplex of carotid arteries - high grade left carotid artery stenosis (PSV 477, EDV 97, ICA/CCA 5.3). Patent right carotid stents without recurrent stenosis.   Yevonne Aline. Stanford Breed, MD [facility] Phone Number: [phone] 12/23/2020 8:20 AM

## 2020-12-24 ENCOUNTER — Other Ambulatory Visit: Payer: Self-pay

## 2020-12-25 NOTE — Pre-Procedure Instructions (Signed)
Reviewed patients chart with Dr Adalberto Ill who feels that due to recent and past extensive cardiac history, that patient does not need to be done here. I called Minette Brine, [phone], Dr Luther Parody scheduler and left her a message about all of this. I left my name and number for her to contact me if she has any questions. Blair Hailey, OR scheduler also notified.

## 2020-12-26 ENCOUNTER — Encounter (HOSPITAL_COMMUNITY)
Admission: RE | Admit: 2020-12-26 | Discharge: 2020-12-26 | Disposition: A | Discharge status: Home / Self Care | Payer: Medicare HMO | Source: Ambulatory Visit | Attending: Vascular Surgery | Admitting: Vascular Surgery

## 2020-12-26 ENCOUNTER — Other Ambulatory Visit: Payer: Self-pay

## 2020-12-30 ENCOUNTER — Ambulatory Visit (HOSPITAL_COMMUNITY): Admission: RE | Admit: 2020-12-30 | Payer: Medicare HMO | Source: Home / Self Care | Admitting: Vascular Surgery

## 2020-12-30 ENCOUNTER — Encounter (HOSPITAL_COMMUNITY): Admission: RE | Payer: Self-pay | Source: Home / Self Care

## 2020-12-30 SURGERY — ARTERIOVENOUS (AV) FISTULA CREATION
Anesthesia: Choice | Laterality: Left

## 2021-01-02 ENCOUNTER — Other Ambulatory Visit: Payer: Self-pay | Admitting: *Deleted

## 2021-01-02 DIAGNOSIS — I6523 Occlusion and stenosis of bilateral carotid arteries: Secondary | ICD-10-CM

## 2021-01-20 ENCOUNTER — Ambulatory Visit (HOSPITAL_COMMUNITY)
Admission: RE | Admit: 2021-01-20 | Discharge: 2021-01-20 | Disposition: A | Discharge status: Home / Self Care | Payer: Medicare HMO | Source: Ambulatory Visit | Attending: Vascular Surgery | Admitting: Vascular Surgery

## 2021-01-20 ENCOUNTER — Other Ambulatory Visit: Payer: Self-pay

## 2021-01-20 ENCOUNTER — Telehealth: Payer: Self-pay

## 2021-01-20 DIAGNOSIS — I6523 Occlusion and stenosis of bilateral carotid arteries: Secondary | ICD-10-CM

## 2021-01-20 DIAGNOSIS — I639 Cerebral infarction, unspecified: Secondary | ICD-10-CM

## 2021-01-20 LAB — POCT I-STAT CREATININE: Creatinine, Ser: 1.4 mg/dL — ABNORMAL HIGH (ref 0.61–1.24)

## 2021-01-20 MED ORDER — IOHEXOL 350 MG/ML SOLN
75.0000 mL | Freq: Once | INTRAVENOUS | Status: AC | PRN
  Administered 2021-01-20: 75 mL via INTRAVENOUS

## 2021-01-20 NOTE — Telephone Encounter (Signed)
Hospital d/c summary from 12/19/20 requested f/u neurology within 1-2 weeks. No referral found, referral placed.

## 2021-01-20 NOTE — Telephone Encounter (Signed)
-----   Message from Kenneth [name], [place] sent at 01/20/2021 12:44 PM EDT ----- Please let patient know monitor overall reassuring. Reviewed with Dr. [name]. Predominantly normal rhythm with occasional episodes of SVT (brief fast HR from bottom of his heart). His average HR was 55bpm, so would not increase any of his rate controlling medicines at this time. Lowest HR was 44bpm but seen during sleeping hours which is not of any acute concern. Since pt was admitted last month for stroke, please make sure he has seen neurology in follow-up to help guide whether any further workup is needed for his stroke.

## 2021-01-22 DIAGNOSIS — M65341 Trigger finger, right ring finger: Secondary | ICD-10-CM | POA: Insufficient documentation

## 2021-01-27 ENCOUNTER — Other Ambulatory Visit: Payer: Self-pay

## 2021-01-27 ENCOUNTER — Encounter: Payer: Self-pay | Admitting: Vascular Surgery

## 2021-01-27 ENCOUNTER — Ambulatory Visit (INDEPENDENT_AMBULATORY_CARE_PROVIDER_SITE_OTHER): Payer: Medicare HMO | Admitting: Vascular Surgery

## 2021-01-27 VITALS — BP 134/70 | HR 71 | Temp 98.3°F | Resp 20 | Ht 66.0 in | Wt 183.0 lb

## 2021-01-27 DIAGNOSIS — I6523 Occlusion and stenosis of bilateral carotid arteries: Secondary | ICD-10-CM

## 2021-01-27 NOTE — Progress Notes (Signed)
[facility] [place]  ASSESSMENT / PLAN: Kenneth [name] is a 80 y.o. 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  He has a surgically hostile neck given his history of laryngeal cancer, permanent tracheostomy, and radiation.  Given his age, transfemoral stenting carries a high risk of periprocedural stroke and should not be approached lightly. He has two areas of severe stenosis: right common carotid artery in-stent restenosis (~80%). Left carotid bifurcation stenosis (~80%).  I explained the risks, benefits, and alternatives to transfemoral stenting to the patient.  We had lengthy discussion about medical therapy as an alternative to stenting.  I explained that the absolute risk reduction of 6% over 5 years may not be outweighed by the 4% risk of periprocedural stroke for transfemoral stenting in an 80 year old.  He does not seem terribly concerned about the CTA findings and prefers to defer intervention for now.  I will see him again in 3 months.  I counseled him to present to the ER for any strokelike symptoms.  CHIEF COMPLAINT: TIA  HISTORY OF PRESENT ILLNESS: Kenneth [name] is a 80 y.o. male referred to clinic for evaluation after recent TIA.  Patient reports episode of dizziness and instability 12/17/2020 which prompted his presentation to the hospital.  Stroke work-up was initiated.  Remote infarct was identified on MR of the brain.  Patient's symptoms resolved.  Duplex of the carotid arteries revealed patent right carotid stenting and severe left internal carotid artery stenosis.  Neurology evaluation confirmed that the stenosis was likely asymptomatic.  Vascular surgery follow-up was recommended for discussion of revascularization.  On my evaluation today, the patient is asymptomatic from a neurologic standpoint.  He did have an additional episode over the past  weekend which was thankfully limited.  He reports no symptoms attributable to his left carotid artery stenosis, specifically denying amaurosis, facial droop, unilateral weakness, difficulty speaking, difficulty swallowing.  He suffered a MI and required a coronary stent 11/26/20.  01/27/21: Returns to the office to discuss CTA results.  We had a 20-minute conversation about risks, benefits, and alternatives to intervention for the CTA findings.    Past Medical History:  Diagnosis Date   Acute ST elevation myocardial infarction (STEMI) of inferior wall ([place]) 03/01/2020   [facility]   Anxiety    Arthritis    Back (05/04/2018)   Carotid artery disease ([place])    a. h/o R carotid stent.   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   Peripheral vascular disease ([place])    Sinus bradycardia    Stroke [facility])    "he's had several little strokes; many that he wasn't aware of" (05/04/2018)   Tobacco abuse    Tracheal stenosis     Past Surgical History:  Procedure Laterality Date   CAROTID STENT Right 06-13-12; 05/04/2018   CAROTID STENT  INSERTION N/A 06/13/2012   Procedure: CAROTID STENT INSERTION;  Surgeon: Serafina Mitchell, MD;  Location: [facility] CATH LAB;  Service: Cardiovascular;  Laterality: N/A;   CATARACT EXTRACTION, BILATERAL Bilateral    COLONOSCOPY  11/10/2011   Dr. [name]: hemorrhoids, tubular adenoma   CORNEAL TRANSPLANT Bilateral    CORONARY ARTERY BYPASS GRAFT  ~ 2003   "CABG X3"   CORONARY BALLOON  ANGIOPLASTY N/A 11/26/2020   Procedure: CORONARY BALLOON ANGIOPLASTY;  Surgeon: Troy Sine, MD;  Location: [place] CV LAB;  Service: Cardiovascular;  Laterality: N/A;   CORONARY STENT INTERVENTION N/A 06/05/2020   Procedure: CORONARY STENT INTERVENTION;  Surgeon: Lorretta Harp, MD;  Location: [place] CV LAB;  Service: Cardiovascular;  Laterality: N/A;   EYE SURGERY     INSERTION OF RETROGRADE CAROTID STENT Right 05/04/2018   Procedure: INSERTION OF RIGHT  CAROTID STENT using an ABBOT- XACT carotid stent system;  Surgeon: Serafina Mitchell, MD;  Location: [place];  Service: Vascular;  Laterality: Right;   LAPAROSCOPIC CHOLECYSTECTOMY  2007   LEFT HEART CATH AND CORS/GRAFTS ANGIOGRAPHY N/A 10/07/2016   Procedure: Left Heart Cath and Cors/Grafts Angiography;  Surgeon: Troy Sine, MD;  Location: [place] CV LAB;  Service: Cardiovascular;  Laterality: N/A;   LEFT HEART CATH AND CORS/GRAFTS ANGIOGRAPHY N/A 06/05/2020   Procedure: LEFT HEART CATH AND CORS/GRAFTS ANGIOGRAPHY;  Surgeon: Lorretta Harp, MD;  Location: [place] CV LAB;  Service: Cardiovascular;  Laterality: N/A;   LEFT HEART CATH AND CORS/GRAFTS ANGIOGRAPHY N/A 11/26/2020   Procedure: LEFT HEART CATH AND CORS/GRAFTS ANGIOGRAPHY;  Surgeon: Troy Sine, MD;  Location: [place] CV LAB;  Service: Cardiovascular;  Laterality: N/A;   PARTIAL LARYNGECTOMY  1998   TRACHEOSTOMY  03/13/2018   "@ [place]"    Family History  Problem Relation Age of Onset   Dementia Mother    Heart disease Father    Cancer Brother    Heart disease Brother    Hyperlipidemia Brother    Cancer Brother    Colon cancer Neg Hx     Social History   Socioeconomic History   Marital status: Married    Spouse name: Not on file   Number of children: Not on file   Years of education: Not on file   Highest education level: Not on file  Occupational History   Occupation: retired    Comment: Warden/ranger  Tobacco Use   Smoking  status: Former    Packs/day: 1.00    Years: 15.00    Pack years: 15.00    Types: Cigarettes    Start date: 04/22/1953    Quit date: 07/26/1970    Years since quitting: 50.5   Smokeless tobacco: Former    Types: Chew    Quit date: 05/05/2002  Vaping Use   Vaping Use: Never used  Substance and Sexual Activity   Alcohol use: Never    Alcohol/week: 0.0 standard drinks   Drug use: Never   Sexual activity: Not Currently  Other Topics Concern   Not on file  Social History Narrative   Walks on the treadmill every other day at the [facility] for the last couple of weeks.         Social Determinants of Health   Financial Resource Strain: Not on file  Food Insecurity: Not on file  Transportation Needs: Not on file  Physical Activity: Not on file  Stress: Not on file  Social Connections: Not on file  Intimate Partner  Violence: Not on file    Allergies  Allergen Reactions   Nifedipine Other (See Comments)   Lorazepam Other (See Comments)    REACTION: Alters mental status. "Turns into maniac" REACTION: Alters mental status. "Turns into maniac" REACTION: Alters mental status. "Turns into maniac"   Other     REACTION: Unknown to patient. States that MD states allergy per medical records   Penicillins Hives    Has patient had a PCN reaction causing immediate rash, facial/tongue/throat swelling, SOB or lightheadedness with hypotension: Yes Has patient had a PCN reaction causing severe rash involving mucus membranes or skin necrosis: No Has patient had a PCN reaction that required hospitalization No Has patient had a PCN reaction occurring within the last 10 years: No If all of the above answers are "NO", then may proceed with Cephalosporin use.  HAS TOLERATED: cephalexin   Sulfacetamide     REACTION: Unknown to patient. States that MD states allergy per medical records   Sulfonamide Derivatives Other (See Comments)    REACTION: Unknown to patient. States that MD states allergy per  medical records   Erythromycin Rash   Statins Itching and Rash    Rash on his trunk and arms, swelling in his lips   Vancomycin Itching    Current Outpatient Medications  Medication Sig Dispense Refill   acetaminophen (TYLENOL) 500 MG tablet Take 500 mg by mouth every 6 (six) hours as needed.     Alirocumab (PRALUENT) 75 MG/ML SOAJ Inject 1 pen into the skin every 14 (fourteen) days. 2 mL 11   amLODipine (NORVASC) 2.5 MG tablet Take 1 tablet (2.5 mg total) by mouth daily. 30 tablet 11   aspirin EC 81 MG tablet Take 325 mg by mouth daily. Swallow whole.     clopidogrel (PLAVIX) 75 MG tablet Take 1 tablet by mouth once daily 90 tablet 3   ezetimibe (ZETIA) 10 MG tablet Take 1 tablet by mouth once daily 90 tablet 1   metoprolol succinate (TOPROL XL) 25 MG 24 hr tablet Take 0.5 tablets (12.5 mg total) by mouth daily. 45 tablet 3   nitroGLYCERIN (NITROSTAT) 0.4 MG SL tablet Place 1 tablet (0.4 mg total) under the tongue every 5 (five) minutes as needed for chest pain. 25 tablet 3   pantoprazole (PROTONIX) 40 MG tablet Take 1 tablet (40 mg total) by mouth daily. 90 tablet 3   PARoxetine (PAXIL) 20 MG tablet Take 20 mg by mouth at bedtime.     No current facility-administered medications for this visit.    REVIEW OF SYSTEMS:  [X]  denotes positive finding, [ ]  denotes negative finding Cardiac  Comments:  Chest pain or chest pressure:    Shortness of breath upon exertion:    Short of breath when lying flat:    Irregular heart rhythm:        Vascular    Pain in calf, thigh, or hip brought on by ambulation:    Pain in feet at night that wakes you up from your sleep:     Blood clot in your veins:    Leg swelling:         Pulmonary    Oxygen at home:    Productive cough:     Wheezing:         Neurologic    Sudden weakness in arms or legs:     Sudden numbness in arms or legs:     Sudden onset of difficulty speaking or slurred speech:    Temporary  loss of vision in one eye:      Problems with dizziness:         Gastrointestinal    Blood in stool:     Vomited blood:         Genitourinary    Burning when urinating:     Blood in urine:        Psychiatric    Major depression:         Hematologic    Bleeding problems:    Problems with blood clotting too easily:        Skin    Rashes or ulcers:        Constitutional    Fever or chills:      PHYSICAL EXAM  There were no vitals filed for this visit.   Constitutional: elderly, chronically ill appearing. no distress. Appears well nourished.  Neurologic: CN intact. no focal findings. no sensory loss. Psychiatric: Mood and affect symmetric and appropriate. Eyes: No icterus. No conjunctival pallor. Ears, nose, throat: mucous membranes moist. Tracheostomy with passy-muir valve. Cardiac: regular rate and rhythm.  Respiratory: unlabored. Abdominal: soft, non-tender, non-distended.  Extremity: No edema. No cyanosis. No pallor.  Skin: No gangrene. No ulceration.  Lymphatic: No Stemmer's sign. No palpable lymphadenopathy.  PERTINENT LABORATORY AND RADIOLOGIC DATA  Most recent CBC CBC Latest Ref Rng & Units 12/19/2020 12/17/2020 11/27/2020  WBC 4.0 - 10.5 K/uL 7.9 8.4 8.9  Hemoglobin 13.0 - 17.0 g/dL 12.5(L) 13.0 12.6(L)  Hematocrit 39.0 - 52.0 % 38.3(L) 40.7 39.2  Platelets 150 - 400 K/uL 238 283 214     Most recent CMP CMP Latest Ref Rng & Units 01/20/2021 12/19/2020 12/18/2020  Glucose 70 - 99 mg/dL - 109(H) 111(H)  BUN 8 - 23 mg/dL - 22 25(H)  Creatinine 0.61 - 1.24 mg/dL 1.40(H) 1.65(H) 1.73(H)  Sodium 135 - 145 mmol/L - 135 137  Potassium 3.5 - 5.1 mmol/L - 4.3 5.3(H)  Chloride 98 - 111 mmol/L - 103 103  CO2 22 - 32 mmol/L - 25 28  Calcium 8.9 - 10.3 mg/dL - 8.9 9.1  Total Protein 6.5 - 8.1 g/dL - 6.8 -  Total Bilirubin 0.3 - 1.2 mg/dL - 0.5 -  Alkaline Phos 38 - 126 U/L - 113 -  AST 15 - 41 U/L - 19 -  ALT 0 - 44 U/L - 15 -    Renal function CrCl cannot be calculated (Unknown ideal  weight.).  Hgb A1c MFr Bld (%)  Date Value  12/19/2020 6.1 (H)    LDL Chol Calc (NIH)  Date Value Ref Range Status  09/26/2020 44 0 - 99 mg/dL Final   LDL Cholesterol  Date Value Ref Range Status  12/19/2020 10 0 - 99 mg/dL Final    Comment:           Total Cholesterol/HDL:CHD Risk Coronary Heart Disease Risk Table                     Men   Women  1/2 Average Risk   3.4   3.3  Average Risk       5.0   4.4  2 X Average Risk   9.6   7.1  3 X Average Risk  23.4   11.0        Use the calculated Patient Ratio above and the CHD Risk Table to determine the patient's CHD Risk.        ATP III CLASSIFICATION (LDL):  <100  mg/dL   Optimal  100-129  mg/dL   Near or Above                    Optimal  130-159  mg/dL   Borderline  160-189  mg/dL   High  >190     mg/dL   Very High Performed at [facility], [address], [place], [place] [address]      Vascular Imaging: Reviewed duplex of carotid arteries - high grade left carotid artery stenosis (PSV 477, EDV 97, ICA/CCA 5.3). Patent right carotid stents without recurrent stenosis.   CTA personally reviewed. Radiology read as 75% in-stent right common carotid artery stenosis; 65% left carotid bifurcation stenosis. I think the degree of stenosis is much higher than this, likely close to 80%.  Yevonne Aline. Stanford Breed, MD [facility] Phone Number: [phone] 01/27/2021 8:21 AM

## 2021-01-29 ENCOUNTER — Other Ambulatory Visit: Payer: Self-pay

## 2021-01-29 DIAGNOSIS — I6523 Occlusion and stenosis of bilateral carotid arteries: Secondary | ICD-10-CM

## 2021-03-15 ENCOUNTER — Other Ambulatory Visit: Payer: Self-pay | Admitting: Cardiology

## 2021-03-19 ENCOUNTER — Ambulatory Visit: Payer: PRIVATE HEALTH INSURANCE | Admitting: Cardiology

## 2021-03-19 ENCOUNTER — Ambulatory Visit: Payer: Medicare HMO | Admitting: Cardiology

## 2021-03-19 ENCOUNTER — Other Ambulatory Visit: Payer: Self-pay

## 2021-03-19 ENCOUNTER — Encounter: Payer: Self-pay | Admitting: Cardiology

## 2021-03-19 VITALS — BP 126/74 | HR 60 | Ht 66.0 in | Wt 179.6 lb

## 2021-03-19 DIAGNOSIS — I6523 Occlusion and stenosis of bilateral carotid arteries: Secondary | ICD-10-CM

## 2021-03-19 DIAGNOSIS — I2510 Atherosclerotic heart disease of native coronary artery without angina pectoris: Secondary | ICD-10-CM | POA: Diagnosis not present

## 2021-03-19 DIAGNOSIS — I10 Essential (primary) hypertension: Secondary | ICD-10-CM | POA: Diagnosis not present

## 2021-03-19 DIAGNOSIS — E782 Mixed hyperlipidemia: Secondary | ICD-10-CM

## 2021-03-19 NOTE — Progress Notes (Signed)
Clinical Summary Kenneth [name] is a 80 y.o.male seen today for follow up of the following medical problems.    1. CAD   - prior CABG in 2003, repeat cath 2007 with patent grafts.   - 07/2011 MPI low risk study, no ischemia.   - echo 04/2012 LVEF 65-60%, grade II diastolic dysfunction  - 07/1912 echo showed LVEF 65-70%, grade II diastolic dysfunction.  - 11/2013 MPI no ischemia.  - 10/2017 echo LVEF 60-65%, no WMAs, normal diastolic function     - 01/8294 nuclear stress: no ischemia  - 02/2020 inferior STEMI, received DES to RCA 05/2020 NSTEMI, DES to RCA  11/2020 NSTEMI, peak trop 162 11/2020 with RCA ISR, had PTCA. Recs for indefinite DAPT  (on high dose ASA per neuro given prior CVA, carotid disease) 11/2020 echo: LVEF 65-70%, no WMAs, normal RV,   - no recent chest pains. Chronic SOB unchanged he attributes to his trach.  - compliant with meds. No ACE or ARB due to renal dysfunction.    2. Hyperlipidemia   - reported history of rash on statin, he reports tried multiple and all caused rash  - he is on zetia, praluent   11/2020 TC 91 TG 239 LDL 10 - reports some dietary indiscrestions.    3. HTN   - he is compliant with meds   4. Carotid stenosis   - prior carotid stenting   - followed by vascular, recs have been for DAPT  - 12/2128 carotid US: LICA 86-57%, IRCA 8-46% with patent right CCA stent - recent vascualr appt 01/2021, plans for medical therapy due to risk for intervention and patient preference.     5. SOB - remote 10-15 year history of smoking. Used to work in [place], mainly with walking up incline.    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   - followed by pulm Notes mention CT imaging consistent with slowly resolving ALI, lung imaging has shown consistent interstitial findings.        6.  Throat cancer  s/p radiation and surgery - followed at [facility]   - has tracheostomy   7. CVA - admit 10/2016 with small cortical based infarcts in right parietal lobe, associated hemorrhage - right ICA 50-69% 10/2017  - admit 11/2020 with TIA - was changed to high dose ASA at that time  Past Medical History:  Diagnosis Date   Acute ST elevation myocardial infarction (STEMI) of inferior wall ([place]) 03/01/2020   [facility]   Anxiety    Arthritis    Back (05/04/2018)   Carotid artery disease ([place])    a. h/o R carotid stent.   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 "Turns into maniac" REACTION: Alters mental status. "Turns into maniac" REACTION: Alters mental status. "Turns into maniac"   Other     REACTION: Unknown to patient. States that MD states allergy per medical records   Penicillins Hives    Has patient had a PCN reaction causing immediate rash, facial/tongue/throat swelling, SOB or lightheadedness with hypotension: Yes Has patient had a PCN reaction causing severe rash involving mucus membranes or skin necrosis: No Has patient had a PCN reaction that required  hospitalization No Has patient had a PCN reaction occurring within the last 10 years: No If all of the above answers are "NO", then may proceed with Cephalosporin use.  HAS TOLERATED: cephalexin   Sulfacetamide     REACTION: Unknown to patient. States that MD states allergy per medical records   Sulfonamide Derivatives Other (See Comments)    REACTION: Unknown to patient. States that MD states allergy per medical records   Erythromycin Rash   Statins Itching and Rash    Rash on his trunk and arms, swelling in his lips   Vancomycin Itching     Current Outpatient Medications  Medication Sig Dispense Refill   acetaminophen (TYLENOL) 500 MG tablet Take 500 mg by mouth every 6 (six) hours as needed.     Alirocumab (PRALUENT) 75 MG/ML SOAJ Inject 1 pen into the skin every 14 (fourteen) days. 2 mL 11   amLODipine (NORVASC) 2.5 MG tablet Take 1 tablet (2.5 mg total) by mouth daily. 30 tablet 11   aspirin EC 81 MG tablet Take 325 mg by mouth daily. Swallow whole.     clopidogrel (PLAVIX) 75 MG tablet Take 1 tablet by mouth once daily 90 tablet 3   ezetimibe (ZETIA) 10 MG tablet Take 1 tablet by mouth once daily 90 tablet 1   metoprolol succinate (TOPROL XL) 25 MG 24 hr tablet Take 0.5 tablets (12.5 mg total) by mouth daily. 45 tablet 3   nitroGLYCERIN (NITROSTAT) 0.4 MG SL tablet Place 1 tablet (0.4 mg total) under the tongue every 5 (five) minutes as needed for chest pain. 25 tablet 3   pantoprazole (PROTONIX) 40 MG tablet Take 1 tablet (40 mg total) by mouth daily. 90 tablet 3   PARoxetine (PAXIL) 20 MG tablet Take 20 mg by mouth at bedtime.     No current facility-administered medications for this visit.     Past Surgical History:  Procedure Laterality Date   CAROTID STENT Right 06-13-12; 05/04/2018   CAROTID STENT INSERTION N/A 06/13/2012   Procedure: CAROTID STENT INSERTION;  Surgeon: Serafina Mitchell, MD;  Location: [facility] CATH LAB;  Service: Cardiovascular;  Laterality: N/A;   CATARACT  EXTRACTION, BILATERAL Bilateral    COLONOSCOPY  11/10/2011   Dr. [name]: hemorrhoids, tubular adenoma   CORNEAL TRANSPLANT Bilateral    CORONARY ARTERY BYPASS GRAFT  ~ 2003   "CABG X3"   CORONARY BALLOON ANGIOPLASTY N/A 11/26/2020   Procedure: CORONARY BALLOON ANGIOPLASTY;  Surgeon: Troy Sine, MD;  Location: [facility] CV LAB;  Service: Cardiovascular;  Laterality: N/A;   CORONARY STENT INTERVENTION N/A 06/05/2020   Procedure: CORONARY STENT INTERVENTION;  Surgeon: Lorretta Harp, MD;  Location: [place] CV  LAB;  Service: Cardiovascular;  Laterality: N/A;   EYE SURGERY     INSERTION OF RETROGRADE CAROTID STENT Right 05/04/2018   Procedure: INSERTION OF RIGHT  CAROTID STENT using an ABBOT- XACT carotid stent system;  Surgeon: Serafina Mitchell, MD;  Location: [facility];  Service: Vascular;  Laterality: Right;   LAPAROSCOPIC CHOLECYSTECTOMY  2007   LEFT HEART CATH AND CORS/GRAFTS ANGIOGRAPHY N/A 10/07/2016   Procedure: Left Heart Cath and Cors/Grafts Angiography;  Surgeon: Troy Sine, MD;  Location: [place] CV LAB;  Service: Cardiovascular;  Laterality: N/A;   LEFT HEART CATH AND CORS/GRAFTS ANGIOGRAPHY N/A 06/05/2020   Procedure: LEFT HEART CATH AND CORS/GRAFTS ANGIOGRAPHY;  Surgeon: Lorretta Harp, MD;  Location: [place] CV LAB;  Service: Cardiovascular;  Laterality: N/A;   LEFT HEART CATH AND CORS/GRAFTS ANGIOGRAPHY N/A 11/26/2020   Procedure: LEFT HEART CATH AND CORS/GRAFTS ANGIOGRAPHY;  Surgeon: Troy Sine, MD;  Location: [facility] CV LAB;  Service: Cardiovascular;  Laterality: N/A;   PARTIAL LARYNGECTOMY  1998   TRACHEOSTOMY  03/13/2018   "@ [facility]"     Allergies  Allergen Reactions   Nifedipine Other (See Comments)   Lorazepam Other (See Comments)    REACTION: Alters mental status. "Turns into maniac" REACTION: Alters mental status. "Turns into maniac" REACTION: Alters mental status. "Turns into maniac"   Other     REACTION: Unknown to patient. States  that MD states allergy per medical records   Penicillins Hives    Has patient had a PCN reaction causing immediate rash, facial/tongue/throat swelling, SOB or lightheadedness with hypotension: Yes Has patient had a PCN reaction causing severe rash involving mucus membranes or skin necrosis: No Has patient had a PCN reaction that required hospitalization No Has patient had a PCN reaction occurring within the last 10 years: No If all of the above answers are "NO", then may proceed with Cephalosporin use.  HAS TOLERATED: cephalexin   Sulfacetamide     REACTION: Unknown to patient. States that MD states allergy per medical records   Sulfonamide Derivatives Other (See Comments)    REACTION: Unknown to patient. States that MD states allergy per medical records   Erythromycin Rash   Statins Itching and Rash    Rash on his trunk and arms, swelling in his lips   Vancomycin Itching      Family History  Problem Relation Age of Onset   Dementia Mother    Heart disease Father    Cancer Brother    Heart disease Brother    Hyperlipidemia Brother    Cancer Brother    Colon cancer Neg Hx      Social History Kenneth [name] reports that he quit smoking about 50 years ago. His smoking use included cigarettes. He started smoking about 67 years ago. He has a 15.00 pack-year smoking history. He quit smokeless tobacco use about 18 years ago.  His smokeless tobacco use included chew. Kenneth [name] reports no history of alcohol use.   Review of Systems CONSTITUTIONAL: No weight loss, fever, chills, weakness or fatigue.  HEENT: Eyes: No visual loss, blurred vision, double vision or yellow sclerae.No hearing loss, sneezing, congestion, runny nose or sore throat.  SKIN: No rash or itching.  CARDIOVASCULAR: per hpi RESPIRATORY: No shortness of breath, cough or sputum.  GASTROINTESTINAL: No anorexia, nausea, vomiting or diarrhea. No abdominal pain or blood.  GENITOURINARY: No burning on urination, no  polyuria NEUROLOGICAL: No headache, dizziness, syncope, paralysis, ataxia, numbness or tingling in the extremities.  No change in bowel or bladder control.  MUSCULOSKELETAL: No muscle, back pain, joint pain or stiffness.  LYMPHATICS: No enlarged nodes. No history of splenectomy.  PSYCHIATRIC: No history of depression or anxiety.  ENDOCRINOLOGIC: No reports of sweating, cold or heat intolerance. No polyuria or polydipsia.  Marland Kitchen   Physical Examination Today's Vitals   03/19/21 1325  BP: 126/74  Pulse: 60  SpO2: 98%  Weight: 179 lb 9.6 oz (81.5 kg)  Height: _0  (1.676 m)   Body mass index is 28.99 kg/m.  Gen: resting comfortably, no acute distress HEENT: no scleral icterus, pupils equal round and reactive, no palptable cervical adenopathy,  CV: RRR, no mr/ g no jvd Resp: Clear to auscultation bilaterally GI: abdomen is soft, non-tender, non-distended, normal bowel sounds, no hepatosplenomegaly MSK: extremities are warm, no edema.  Skin: warm, no rash Neuro:  no focal deficits Psych: appropriate affect   Diagnostic Studies  07/2011 MPI   Tomographic views were obtained using the short axis, vertical long axis, and horizontal long axis planes. No significant, reversible perfusion defects are noted to indicate ischemia.  Gated imaging reveals an EDV of 59, ESV of 18, T I D ratio of 0.80, and LVEF of 69%.  IMPRESSION: Low risk exercise/Lexiscan Myoview as outlined. No diagnostic ST- segment changes or arrhythmias were noted. There is evidence of soft tissue attenuation, however no frank scar or ischemia. LVEF is normal at 69%.   04/2012 Echo   LVEF 65-70%, grade II diastolic dysfunction,     11/05/13 Echo Study Conclusions  - Left ventricle: The cavity size was normal. Wall thickness was increased in a pattern of mild LVH. Systolic function was vigorous. The estimated ejection fraction was in the range of 65% to 70%. Wall motion was normal; there were no regional wall  motion abnormalities. Features are consistent with a pseudonormal left ventricular filling pattern, with concomitant abnormal relaxation and increased filling pressure (grade 2 diastolic dysfunction). - Aortic valve: Mildly calcified annulus. Trileaflet. Trivial regurgitation. - Mitral valve: Calcified annulus. Trivial regurgitation. - Left atrium: The atrium was mildly dilated. - Right ventricle: Systolic function was low normal. - Right atrium: Central venous pressure: 3m Hg (est). - Atrial septum: No defect or patent foramen ovale was identified. - Tricuspid valve: Trivial regurgitation. - Pulmonary arteries: Systolic pressure could not be accurately estimated. - Pericardium, extracardiac: There was no pericardial effusion. Impressions:  - Normal LV chamber size with mild LVH and LVEF 630-09% grade 2 diastolic dysfunction. MIld left atrial enlargement. Mild MAC. Low normal RV contraction. Unable to assess PASP - trivial tricuspid regurgitation.   11/2013 Lexiscan MPI IMPRESSION: 1.  Negative Lexiscan MPI for ischemia   2.  Normal left ventricular systolic function, LVEF 623%  3.  Low risk study for major cardiac events.     09/2016 cath LM lesion, 50 %stenosed. Prox LAD to Mid LAD lesion, 70 %stenosed. Ost 1st Mrg lesion, 100 %stenosed. RIMA and is normal in caliber and anatomically normal. RPDA-2 lesion, 50 %stenosed. RPDA-1 lesion, 60 %stenosed. LIMA. The left ventricular systolic function is normal. LV end diastolic pressure is normal.   Normal LV function without focal segmental wall motion abnormalities.   Multivessel  native CAD with 50% ostial stenosis of the LAD and diffuse 70% mid stenoses, occlusion of the OM1 vessel of the circumflex, and diffuse 60 and 50% PDA stenoses in the small caliber PDA vessel.   Patent sequential LIMA graft supplying a twin like diagonal and LAD system system.  Patent RIMA graft supplying the circumflex marginal vessel.    RECOMMENDATION: Medical therapy.     10/2017 echo Study Conclusions   - Left ventricle: The cavity size was normal. Wall thickness was   normal. Systolic function was normal. The estimated ejection   fraction was in the range of 60% to 65%. Wall motion was normal;   there were no regional wall motion abnormalities. Left   ventricular diastolic function parameters were normal. - Aortic valve: Mildly calcified annulus. Trileaflet; mildly   thickened leaflets. Valve area (VTI): 3.01 cm^2. Valve area   (Vmax): 2.24 cm^2. Valve area (Vmean): 2.3 cm^2. - Mitral valve: Mildly calcified annulus. Mildly thickened leaflets   . - Left atrium: The atrium was mildly dilated.   12/2017 nuclear stress No diagnostic ST segment changes to indicate ischemia. No significant myocardial perfusion defects to indicate scar or ischemia. This is a low risk study. Nuclear stress EF: 67%.    11/2020 cath Previously placed Prox RCA stent (unknown type) is widely patent. Ost RCA lesion is 30% stenosed. Mid RCA lesion is 95% stenosed. Ost LM to Mid LM lesion is 80% stenosed. Ost Cx to Prox Cx lesion is 95% stenosed. Post intervention, there is a 0% residual stenosis. Ost LAD to Prox LAD lesion is 80% stenosed. Prox LAD lesion is 90% stenosed. 2nd Diag lesion is 80% stenosed. RV Breella Vanostrand lesion is 80% stenosed.   Severe native CAD with previously noted high-grade stenoses in the left main, proximal and mid LAD,and proximal circumflex.    Mild 30% proximal narrowing in the RCA prior to the proximal patent  stent but evidence for 95 to 99% in-stent restenosis in the mid RCA stent.   Patent sequential LIMA graft supplying the diagonal 1 and mid LAD.   Patent RIMA graft supplying the OM vessel.   Successful PCI to the mid RCA stent utilizing Wolverine cutting balloon 2.0 x 10 mm, 3.0 x 15 mm and ultimate 3.25 x 15 mm noncompliant balloon dilatations with the 95% stenosis being reduced to 0%.   LVEDP 21 mm  Hg.   POST CATH RECOMMENDATION: Continue DAPT indefinitely.  Medical therapy for concomitant CAD.  Continue aggressive lipid-lowering therapy with target LDL less than 70 and optimal blood pressure control.  11/2020 carotid US IMPRESSION: 1. Severe (70-99%) stenosis proximal left internal carotid artery secondary to bulky, heterogeneous and calcified atherosclerotic plaque. 2. Mild (1-49%) stenosis proximal right internal carotid artery secondary to heterogenous atherosclerotic plaque. 3. Patent right common carotid artery stent. 4. Vertebral arteries are patent with normal antegrade flow.  11/2020 echo IMPRESSIONS     1. Left ventricular ejection fraction, by estimation, is 65 to 70%. The  left ventricle has normal function. The left ventricle has no regional  wall motion abnormalities. Left ventricular diastolic parameters were  normal.   2. Right ventricular systolic function is normal. The right ventricular  size is normal. There is normal pulmonary artery systolic pressure. The  estimated right ventricular systolic pressure is 60.7 mmHg.   3. Left atrial size was mildly dilated.   4. The mitral valve is grossly normal. Trivial mitral valve  regurgitation.   5. The aortic valve is tricuspid. There is moderate calcification of the  aortic valve. Aortic valve regurgitation is not visualized. Mild to  moderate aortic valve sclerosis/calcification is present, without any  evidence of aortic stenosis. Aortic valve  mean gradient measures 7.0 mmHg.   6. The inferior vena cava is normal in size with greater than 50%  respiratory variability, suggesting right atrial pressure of 3 mmHg.   Assessment and Plan  1. CAD   -no recent symptoms - indefinitie DAPT given degree of cornary disease. Appears to be on high dose ASA since he had a prior TIA on ASA 81 and plavix in 11/2020.    2. Hyperlipidemia   - LDL at goal, continue zetia and praluent. Discusse dietary changes to improve  TGs   3. HTN   - at goal, continue current meds   4. Carotid stenosis   - recs have been for DAPT for his carotid stent - continue current medical therapy, continue to f/u with vacular      Arnoldo Lenis, M.D.

## 2021-03-19 NOTE — Patient Instructions (Signed)

## 2021-03-25 ENCOUNTER — Encounter: Payer: Self-pay | Admitting: Internal Medicine

## 2021-03-25 ENCOUNTER — Other Ambulatory Visit: Payer: Self-pay

## 2021-03-25 ENCOUNTER — Ambulatory Visit: Payer: Medicare HMO | Admitting: Internal Medicine

## 2021-03-25 ENCOUNTER — Ambulatory Visit (HOSPITAL_COMMUNITY)
Admission: RE | Admit: 2021-03-25 | Discharge: 2021-03-25 | Disposition: A | Discharge status: Home / Self Care | Payer: Medicare HMO | Source: Ambulatory Visit | Attending: Internal Medicine | Admitting: Internal Medicine

## 2021-03-25 DIAGNOSIS — R0609 Other forms of dyspnea: Secondary | ICD-10-CM

## 2021-03-25 DIAGNOSIS — R0600 Dyspnea, unspecified: Secondary | ICD-10-CM | POA: Insufficient documentation

## 2021-03-25 DIAGNOSIS — Z930 Tracheostomy status: Secondary | ICD-10-CM

## 2021-03-25 MED ORDER — FAMOTIDINE 20 MG PO TABS: ORAL_TABLET | ORAL | 11 refills | Status: DC

## 2021-03-25 NOTE — Assessment & Plan Note (Signed)
Quit smoking 1972  PFT's  09/24/15   FEV1 2.08(82 % ) ratio 81  p 10 % improvement from saba p no rx prior to study with DLCO  58 % correct to 61 % for alv volume   - 12/05/2015  Walked RA x 3 laps @ 185 ft each stopped due to  End of study, nl pace, no desat,  Mild sob - Spirometry 12/05/2015  FEV1 1.78 (64%)  Ratio 74  s truncation - 12/04/2018   Walked RA  2 laps @  approx 246f each @ mod fast pace  stopped due to  End of study, second lap s trach plug helped doe and allowed faster walk   - 12/19/2018   [facility]  approx 250 ft - stopped due to  Legs gave out with sats still 98% at moderate pace  - 10/12/2019   Walked RA x two laps =  approx 5030f@ nl pace - stopped due to end of study, min sob  with sats of 91 % at the end of the study and much better with PMV removed  - 03/25/2021   Walked on RA x  3  lap(s) =  approx 450 @ slow to moderate pace, stopped due to end of study, min sob  with lowest 02 sats 96 %   Reminded needs to always do ex with pmv out, and of course when sob for any reason as he has very poor air movement with pmv in

## 2021-03-25 NOTE — Progress Notes (Signed)
Subjective:   Patient ID: Kenneth [name], male    DOB: [birth year],     MRN: [id number]    Brief patient profile:  29   yowm quit smoking 1972 s/p laryngeal Ca RT/surgery and new doe x 2016 referred to pulmonary clinic 12/05/2015 by Dr  Willey Blade with last eval by [name] c/w new polyp and trach placed Aug 2019    History of Present Illness  12/05/2015 1st [facility] office visit/ Raylin Winer   Chief Complaint  Patient presents with   Advice Only    REferred by Dr. Willey Blade; SOB, no chest tightness, no cough.  Abnormal PFT done 3/1 in [facility].   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 but does fine slow and flat eg  [employer]  rec Change Nexium to 40 mg (= 2 x 20)   Take  30-60 min before first meal of the day and Pepcid (famotidine)  20 mg one @  bedtime until return to see Dr [name] and let him know if this helped GERD diet  There is no evidence of any significant lung disease - follow up here is as needed    03/06/2018 trach done at [facility] and notes say pt could mow grass but pt says could only do riding mower with baseline doe= 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 Willey Blade. Pt c/o SOB since beginning of Feb 2020. He gets SOB walking short distances such as room to room. He has had to suction his trach more often- bloody yellow sputum with occ foul odor.  He is using his albuterol neb 2 x daily.   onset early Feb 2020 fever x one week comes and goes since then seems better just while in hospital  With w/u by ID during last admit to [facility] with temp to 102 on admit. covid neg/sputum grew out pseudomonas 3/25  p last ID note  Documented prompt response to IV meropenem and no need for  further abx. Pseudomonas was sensitive to cipro. 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 but does fine slow and flat   Cough: yellow/ slt bloody  Sleeping: in recliner  better at 45 degrees x feb 2020 whereas prior to Feb able to sleep in flat bed with a couple of pillows SABA use: maybe twice daily / not sure it helps 02: none  rec Omeprazole 20 mg Take 30- 60 min before your first and last meals of the day  Cipro 500 mg twice daily x 2 weeks  Prednisone 10 mg take  4 each am x 2 days,  2 each am x 2 days,  1 each am x 2 days and stop  Keep using  Humidified trach collar as much as possible esp at night with the plug out  For cough > mucinex dm 1200 every 12 hours and supplement with tylenol #3 one every 4 hours if needed For breathing >  Albuterol nebulizer 2.5 mg every 4 hours and take the trach cap off  Please remember to go  to the lab   department   for your tests - we will call you with the results when they are available.  Please schedule a follow up office visit in 2  weeks, sooner if needed  with all medications /inhalers/ solutions in hand so we can verify exactly what you are taking. 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 but does fine slow and flat  Cough: not following instructions to use mucinex dm or tyl #3 >> minimal mucoid suputum  Sleeping: 45 degrees/ no change since early feb 2020  SABA use: neb does help  02: none  rec I will ask Dr Charm Barges to address the issue with humidified trach collar at bedtime   GERD diet  Prednisone 10 mg take  4 each am x 2 days,   2 each am x 2 days,  1 each am x 2 days and stop  For cough > mucinex dm 1200 every 12 hours and supplement with tylenol #3  One-half  every 4 hours if needed For breathing >  Albuterol nebulizer 2.5 mg every 4 hours and take the trach cap off  Please remember to go to the  x-ray department  for your tests - we will call you with the results when they are available     12/19/2018  f/u ov/Shen[facility]da Genova re:  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 He is coughing more-yellow to clear sputum when he suctions.   Dyspnea:  Walking around the house /legs are limiting Cough: "real dry" (not using humidfier and using guaifensin produce with antihistamines against advice)  Sleeping: 45 elevation/ if not coughs  SABA use  twice daily  Neb avg rec Ok to use nebulizer before heavy exertion to see if helps. Stop the Tussin and use mucinex dm up to 1200 mg every 12 hours as needed  Please schedule a follow up office visit in 4 weeks, sooner if needed with cxr on return         10/12/2019  f/u ov/Jakeb Lamping re: [name]  Chief Complaint  Patient presents with   Follow-up    Breathing is unchanged since the last visit.    Dyspnea:  Limited by hips > sob but goes slow / [employer] ok but not WM Cough: spells/ mostly dry hack  Sleeping: having to sleep in recliner due to cough  - 45 degrees  SABA use: using neb tiw  02: none   Rec Prilosec (omerazole) should be Take 30- 60 min before your first and last meals of the day   GERD diet reviewed, bed blocks rec  For cough > mucinex dm 1200 mg every 12 hours as needed For shortness of breath>  Nebulized albuterol  up to every 4 hours is needed per Nebulizer  Please schedule a follow up visit in 3 months but call  sooner if needed  with all medications /inhalers/ solutions in hand so we can verify exactly what  you are taking. This includes all medications from all doctors and over the counters  - we will see you in the [place] office    03/25/2021  f/u ov/[place] office/Jaylie Neaves re:  [name]  Chief Complaint  Patient presents with   Follow-up    Breathing is about the same. Spouse has noticed increased cough x 3 months- non prod and worse in the evenings.    Dyspnea:  [employer] walking about the same breathing better with pmv out s02 Cough: worse p supper  Sleeping: bed blocks 4 in / 2 pillows, sometimes recliner 30 degrees/ leaves pmv out and no humidity/ mucus thick white in am  SABA use: none  02: none  Covid status: vax x 3      No obvious day to day or daytime variability or assoc  purulent sputum or mucus plugs or hemoptysis or cp or chest tightness, subjective wheeze or overt sinus or hb symptoms.     Also denies any obvious fluctuation of symptoms with weather or environmental changes or other aggravating or alleviating factors except as outlined above   No unusual exposure hx or h/o childhood pna/ asthma or knowledge of premature birth.  Current Allergies, Complete Past Medical History, Past Surgical History, Family History, and Social History were reviewed in [employer] record.  ROS  The following are not active complaints unless bolded Hoarseness, sore throat, dysphagia, dental problems, itching, sneezing,  nasal congestion or discharge of excess mucus or purulent secretions, ear ache,   fever, chills, sweats, unintended wt loss or wt gain, classically pleuritic or exertional cp,  orthopnea pnd or arm/hand swelling  or leg swelling, presyncope, palpitations, abdominal pain, anorexia, nausea, vomiting, diarrhea  or change in bowel habits or change in bladder habits, change in stools or change in urine, dysuria, hematuria,  rash, arthralgias, visual complaints, headache, numbness, weakness or ataxia or problems with walking or coordination,   change in mood or  memory.        Current Meds  Medication Sig   acetaminophen (TYLENOL) 500 MG tablet Take 500 mg by mouth every 6 (six) hours as needed.   Alirocumab (PRALUENT) 75 MG/ML SOAJ Inject 1 pen into the skin every 14 (fourteen) days.   amLODipine (NORVASC) 10 MG tablet Take 10 mg by mouth daily.   aspirin EC 81 MG tablet Take 325 mg by mouth daily. Swallow whole.   clopidogrel (PLAVIX) 75 MG tablet Take 1 tablet by mouth once daily   ezetimibe (ZETIA) 10 MG tablet Take 1 tablet by mouth once daily        metoprolol succinate (TOPROL XL) 25 MG 24 hr tablet Take 0.5 tablets (12.5 mg total) by mouth daily.   nitroGLYCERIN (NITROSTAT) 0.4 MG SL tablet Place 1 tablet (0.4 mg total) under the tongue every 5 (five) minutes as needed for chest pain.   pantoprazole (PROTONIX) 40 MG tablet Take 1 tablet (40 mg total) by mouth daily.   PARoxetine (PAXIL) 20 MG tablet Take 20 mg by mouth at bedtime.   spironolactone (ALDACTONE) 25 MG tablet Take 25 mg by mouth daily.               Objective:   Physical Exam  03/25/2021        180  10/12/2019        189  04/19/2019  178  01/16/2019        179  12/19/2018       178 12/04/2018        176   11/10/2018       172  12/05/15 182 lb 9.6 oz (82.827 kg)  08/07/15 182 lb (82.555 kg)  07/18/15 176 lb (79.833 kg)     Vital signs reviewed  03/25/2021  - Note at rest 02 sats  99% on RA   General appearance:    very hoarse amb wm very hard of hearing   HEENT : pt wearing mask not removed for exam due to covid - 19 concerns.   NECK :  without JVD/Nodes/TM/ nl carotid upstrokes bilaterally   LUNGS: no acc muscle use,  Min barrel  contour chest wall with bilateral  slightly decreased bs s audible wheeze and  without cough on insp or exp maneuvers and min  Hyperresonant  to  percussion bilaterally     CV:  RRR  no s3 or murmur or increase in P2, and no edema   ABD:  soft and nontender with pos end  insp Hoover's  in the supine position.  No bruits or organomegaly appreciated, bowel sounds nl  MS:   Nl gait/  ext warm without deformities, calf tenderness, cyanosis or clubbing No obvious joint restrictions   SKIN: warm and dry without lesions    NEURO:  alert, approp, nl sensorium with  no motor or cerebellar deficits apparent.         CXR PA and Lateral:   03/25/2021 :    I personally reviewed images and agree with radiology impression as follows:      No acute process in the chest.                Assessment & Plan:

## 2021-03-25 NOTE — Patient Instructions (Addendum)
Pantoprazole (protonix) 40 mg   Take  30-60 min before first meal of the day and Pepcid (famotidine)  20 mg after supper until return to office - this is the best way to tell whether stomach acid is contributing to your problem.    GERD (REFLUX)  is an extremely common cause of respiratory symptoms just like yours , many times with no obvious heartburn at all.    It can be treated with medication, but also with lifestyle changes including elevation of the head of your bed (ideally with 6 -8inch blocks under the headboard of your bed),  Smoking cessation, avoidance of late meals, excessive alcohol, and avoid fatty foods, chocolate, peppermint, colas, red wine, and acidic juices such as orange juice.  NO MINT OR MENTHOL PRODUCTS SO NO COUGH DROPS  USE SUGARLESS CANDY INSTEAD (Jolley ranchers or Stover's or Life Savers) or even ice chips will also do - the key is to swallow to prevent all throat clearing. NO OIL BASED VITAMINS - use powdered substitutes.  Avoid fish oil when coughing.   For cough/ congestion > mucinex dm 1200 mg every 12 hours and maximum humidity   Please remember to go to the  x-ray department  @  [facility] for your tests - we will call you with the results when they are available      Please schedule a follow up visit in 3 months but call sooner if needed

## 2021-03-25 NOTE — Assessment & Plan Note (Addendum)
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  >> f/u ent planned    Each maintenance medication was reviewed in detail including emphasizing most importantly the difference between maintenance and prns and under what circumstances the prns are to be triggered using an action plan format where appropriate.  Total time for H and P, chart review, counseling,  directly observing portions of ambulatory 02 saturation study/ and generating customized AVS unique to this office visit / same day charting = 36 min

## 2021-03-26 ENCOUNTER — Encounter: Payer: Self-pay | Admitting: Internal Medicine

## 2021-03-28 ENCOUNTER — Encounter: Payer: Self-pay | Admitting: Internal Medicine

## 2021-03-31 ENCOUNTER — Encounter: Payer: Self-pay | Admitting: *Deleted

## 2021-04-20 DIAGNOSIS — K219 Gastro-esophageal reflux disease without esophagitis: Secondary | ICD-10-CM | POA: Diagnosis present

## 2021-04-20 DIAGNOSIS — C320 Malignant neoplasm of glottis: Secondary | ICD-10-CM | POA: Insufficient documentation

## 2021-05-04 NOTE — Progress Notes (Signed)
[facility] [place]  ASSESSMENT / PLAN: Kenneth [name] is a 80 y.o. 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  He has a surgically hostile neck given his history of laryngeal cancer, permanent tracheostomy, and radiation.  Given his age, transfemoral stenting carries a high risk of periprocedural stroke and should not be approached lightly. He has two areas of severe stenosis: right common carotid artery in-stent restenosis (~80%). Left carotid bifurcation stenosis (~80%).  We again reviewed the risks, benefits, and alternatives to transfemoral stenting for him.  I think transfemoral stenting is a bad idea for this gentleman so long as his stenosis is asymptomatic.  He is in agreement.  We will continue surveillance with biannual carotid duplex.  CHIEF COMPLAINT: TIA  HISTORY OF PRESENT ILLNESS: Kenneth [name] is a 80 y.o. male referred to clinic for evaluation after recent TIA.  Patient reports episode of dizziness and instability 12/17/2020 which prompted his presentation to the hospital.  Stroke work-up was initiated.  Remote infarct was identified on MR of the brain.  Patient's symptoms resolved.  Duplex of the carotid arteries revealed patent right carotid stenting and severe left internal carotid artery stenosis.  Neurology evaluation confirmed that the stenosis was likely asymptomatic.  Vascular surgery follow-up was recommended for discussion of revascularization.  On my evaluation today, the patient is asymptomatic from a neurologic standpoint.  He did have an additional episode over the past weekend which was thankfully limited.  He reports no symptoms attributable to his left carotid artery stenosis, specifically denying amaurosis, facial droop, unilateral weakness, difficulty speaking, difficulty swallowing.  He suffered a MI and required a  coronary stent 11/26/20.  01/27/21: Returns to the office to discuss CTA results.  We had a 20-minute conversation about risks, benefits, and alternatives to intervention for the CTA findings.  05/05/21: Patient returns to clinic for surveillance.  He is globally doing better per his and his wife's report.  He is started to play golf again.  He is compliant with medical therapy.  He has had no further episodes of transient ischemic attack.  He specifically denies any neurologic symptoms referable to his carotid stenosis: Unilateral weakness, facial droop, difficulty speaking or swallowing, amaurosis.  Past Medical History:  Diagnosis Date   Acute ST elevation myocardial infarction (STEMI) of inferior wall ([place]) 03/01/2020   [facility]   Anxiety    Arthritis    Back (05/04/2018)   Carotid artery disease ([place])    a. h/o R carotid stent.   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 [name]: hemorrhoids, tubular adenoma   CORNEAL TRANSPLANT Bilateral    CORONARY ARTERY BYPASS GRAFT  ~ 2003   "CABG X3"   CORONARY BALLOON ANGIOPLASTY N/A 11/26/2020   Procedure: CORONARY BALLOON ANGIOPLASTY;  Surgeon: Troy Sine, MD;  Location: [place] CV LAB;  Service: Cardiovascular;  Laterality: N/A;   CORONARY STENT INTERVENTION N/A 06/05/2020   Procedure: CORONARY STENT INTERVENTION;  Surgeon: Lorretta Harp, MD;  Location: [place] CV LAB;  Service: Cardiovascular;  Laterality: N/A;   EYE SURGERY     INSERTION OF RETROGRADE CAROTID STENT Right 05/04/2018   Procedure: INSERTION OF RIGHT  CAROTID STENT using an ABBOT- XACT carotid stent system;  Surgeon: Serafina Mitchell, MD;  Location: [place];  Service: Vascular;  Laterality: Right;   LAPAROSCOPIC CHOLECYSTECTOMY  2007   LEFT HEART CATH AND CORS/GRAFTS ANGIOGRAPHY N/A 10/07/2016   Procedure: Left Heart Cath and Cors/Grafts Angiography;  Surgeon: Troy Sine, MD;  Location: [place] CV LAB;  Service: Cardiovascular;  Laterality: N/A;   LEFT HEART CATH AND CORS/GRAFTS ANGIOGRAPHY N/A 06/05/2020   Procedure: LEFT HEART CATH AND CORS/GRAFTS ANGIOGRAPHY;  Surgeon: Lorretta Harp, MD;  Location: [place] CV LAB;  Service: Cardiovascular;  Laterality: N/A;   LEFT HEART CATH AND CORS/GRAFTS ANGIOGRAPHY N/A 11/26/2020   Procedure: LEFT HEART CATH AND CORS/GRAFTS ANGIOGRAPHY;  Surgeon: Troy Sine, MD;  Location: [place] CV LAB;  Service: Cardiovascular;  Laterality: N/A;   PARTIAL LARYNGECTOMY  1998   TRACHEOSTOMY  03/13/2018   "@ [place]"    Family History  Problem Relation Age of Onset   Dementia Mother    Heart disease Father    Cancer Brother    Heart disease Brother    Hyperlipidemia Brother    Cancer Brother    Colon cancer Neg Hx     Social History   Socioeconomic History   Marital status: Married    Spouse name: Not on file   Number of children: Not on file   Years of education: Not on  file   Highest education level: Not on file  Occupational History   Occupation: retired    Comment: Warden/ranger  Tobacco Use   Smoking status: Former    Packs/day: 1.00    Years: 15.00    Pack years: 15.00    Types: Cigarettes    Start date: 04/22/1953    Quit date: 07/26/1970    Years since quitting: 50.8   Smokeless tobacco: Former    Types: Chew    Quit date: 05/05/2002  Vaping Use   Vaping Use: Never used  Substance and Sexual Activity   Alcohol use: Never    Alcohol/week: 0.0 standard drinks   Drug use: Never   Sexual activity: Not Currently  Other Topics Concern   Not on file  Social History Narrative   Walks on the treadmill every other day at the [facility] for the last couple of weeks.         Social Determinants of Health   Financial Resource Strain: Not on file  Food Insecurity: Not on file  Transportation Needs: Not on file  Physical Activity:  Not on file  Stress: Not on file  Social Connections: Not on file  Intimate Partner Violence: Not on file    Allergies  Allergen Reactions   Nifedipine Other (See Comments)   Lorazepam Other (See Comments)    REACTION: Alters mental status. "Turns into maniac" REACTION: Alters mental status. "Turns into maniac" REACTION: Alters mental status. "Turns into maniac"   Other     REACTION: Unknown to patient. States that MD states allergy per medical records   Penicillins Hives    Has patient had a PCN reaction causing immediate rash, facial/tongue/throat swelling, SOB or lightheadedness with hypotension: Yes Has patient had a PCN reaction causing severe rash involving mucus membranes or skin necrosis: No Has patient had a PCN reaction that required hospitalization No Has patient had a PCN reaction occurring within the last 10 years: No If all of the above answers are "NO", then may proceed with Cephalosporin use.  HAS TOLERATED: cephalexin   Sulfacetamide     REACTION: Unknown to patient. States that MD states  allergy per medical records   Sulfonamide Derivatives Other (See Comments)    REACTION: Unknown to patient. States that MD states allergy per medical records   Erythromycin Rash   Statins Itching and Rash    Rash on his trunk and arms, swelling in his lips   Vancomycin Itching    Current Outpatient Medications  Medication Sig Dispense Refill   acetaminophen (TYLENOL) 500 MG tablet Take 500 mg by mouth every 6 (six) hours as needed.     Alirocumab (PRALUENT) 75 MG/ML SOAJ Inject 1 pen into the skin every 14 (fourteen) days. 2 mL 11   amLODipine (NORVASC) 10 MG tablet Take 10 mg by mouth daily.     aspirin EC 81 MG tablet Take 325 mg by mouth daily. Swallow whole.     clopidogrel (PLAVIX) 75 MG tablet Take 1 tablet by mouth once daily 90 tablet 3   ezetimibe (ZETIA) 10 MG tablet Take 1 tablet by mouth once daily 90 tablet 1   famotidine (PEPCID) 20 MG tablet One after supper 30 tablet 11   metoprolol succinate (TOPROL XL) 25 MG 24 hr tablet Take 0.5 tablets (12.5 mg total) by mouth daily. 45 tablet 3   nitroGLYCERIN (NITROSTAT) 0.4 MG SL tablet Place 1 tablet (0.4 mg total) under the tongue every 5 (five) minutes as needed for chest pain. 25 tablet 3   pantoprazole (PROTONIX) 40 MG tablet Take 1 tablet (40 mg total) by mouth daily. 90 tablet 3   PARoxetine (PAXIL) 20 MG tablet Take 20 mg by mouth at bedtime.     spironolactone (ALDACTONE) 25 MG tablet Take 25 mg by mouth daily.     No current facility-administered medications for this visit.    REVIEW OF SYSTEMS:  [X]  denotes positive finding, [ ]  denotes negative finding Cardiac  Comments:  Chest pain or chest pressure:    Shortness of breath upon exertion:    Short of breath when lying flat:    Irregular heart rhythm:        Vascular    Pain in calf, thigh, or hip brought on by ambulation:    Pain in feet at night that wakes you up from your sleep:     Blood clot in your veins:    Leg swelling:         Pulmonary    Oxygen at  home:    Productive cough:     Wheezing:  Neurologic    Sudden weakness in arms or legs:     Sudden numbness in arms or legs:     Sudden onset of difficulty speaking or slurred speech:    Temporary loss of vision in one eye:     Problems with dizziness:         Gastrointestinal    Blood in stool:     Vomited blood:         Genitourinary    Burning when urinating:     Blood in urine:        Psychiatric    Major depression:         Hematologic    Bleeding problems:    Problems with blood clotting too easily:        Skin    Rashes or ulcers:        Constitutional    Fever or chills:      PHYSICAL EXAM  Vitals:   05/05/21 1028  BP: (!) 157/83  Pulse: 70  Resp: 20  Temp: 97.9 F (36.6 C)  SpO2: 98%  Weight: 179 lb (81.2 kg)  Height: 5\' 6"  (1.676 m)     Constitutional: elderly, chronically ill appearing. no distress. Appears well nourished.  Neurologic: CN intact. no focal findings. no sensory loss. Psychiatric: Mood and affect symmetric and appropriate. Eyes: No icterus. No conjunctival pallor. Ears, nose, throat: mucous membranes moist. Tracheostomy with passy-muir valve. Cardiac: regular rate and rhythm.  Respiratory: unlabored. Abdominal: soft, non-tender, non-distended.  Extremity: No edema. No cyanosis. No pallor.  Skin: No gangrene. No ulceration.  Lymphatic: No Stemmer's sign. No palpable lymphadenopathy.  PERTINENT LABORATORY AND RADIOLOGIC DATA  Most recent CBC CBC Latest Ref Rng & Units 12/19/2020 12/17/2020 11/27/2020  WBC 4.0 - 10.5 K/uL 7.9 8.4 8.9  Hemoglobin 13.0 - 17.0 g/dL 12.5(L) 13.0 12.6(L)  Hematocrit 39.0 - 52.0 % 38.3(L) 40.7 39.2  Platelets 150 - 400 K/uL 238 283 214     Most recent CMP CMP Latest Ref Rng & Units 01/20/2021 12/19/2020 12/18/2020  Glucose 70 - 99 mg/dL - 109(H) 111(H)  BUN 8 - 23 mg/dL - 22 25(H)  Creatinine 0.61 - 1.24 mg/dL 1.40(H) 1.65(H) 1.73(H)  Sodium 135 - 145 mmol/L - 135 137  Potassium 3.5 - 5.1  mmol/L - 4.3 5.3(H)  Chloride 98 - 111 mmol/L - 103 103  CO2 22 - 32 mmol/L - 25 28  Calcium 8.9 - 10.3 mg/dL - 8.9 9.1  Total Protein 6.5 - 8.1 g/dL - 6.8 -  Total Bilirubin 0.3 - 1.2 mg/dL - 0.5 -  Alkaline Phos 38 - 126 U/L - 113 -  AST 15 - 41 U/L - 19 -  ALT 0 - 44 U/L - 15 -    Renal function CrCl cannot be calculated (Patient's most recent lab result is older than the maximum 21 days allowed.).  Hgb A1c MFr Bld (%)  Date Value  12/19/2020 6.1 (H)    LDL Chol Calc (NIH)  Date Value Ref Range Status  09/26/2020 44 0 - 99 mg/dL Final   LDL Cholesterol  Date Value Ref Range Status  12/19/2020 10 0 - 99 mg/dL Final    Comment:           Total Cholesterol/HDL:CHD Risk Coronary Heart Disease Risk Table                     Men   Women  1/2 Average Risk   3.4  3.3  Average Risk       5.0   4.4  2 X Average Risk   9.6   7.1  3 X Average Risk  23.4   11.0        Use the calculated Patient Ratio above and the CHD Risk Table to determine the patient's CHD Risk.        ATP III CLASSIFICATION (LDL):  <100     mg/dL   Optimal  100-129  mg/dL   Near or Above                    Optimal  130-159  mg/dL   Borderline  160-189  mg/dL   High  >190     mg/dL   Very High Performed at [facility], [address] [place], [place] [address]      Vascular Imaging: Carotid Arterial Duplex Study   Patient Name:  SUHAAN PERLEBERG  Date of Exam:   05/05/2021  Medical Rec #: [id number]         Accession #:    [id number]  Date of Birth: 1941-07-24         Patient Gender: M  Patient Age:   57 years  Exam Location:  [facility]  Procedure:      VAS US CAROTID  Referring Phys: Jamelle Haring    ---------------------------------------------------------------------------  -----     Indications:                             Carotid artery disease and Right  ICA                                           stent 2013. Right CCA stent  05/04/18.  Other Factors:                            Confirm left 80-99% ICA stenosis.  Comparison Study:                        12/17/20 at [facility]: Mild  (1-49%)                                           stenosis proximal right internal                                           carotid artery                                           secondary to heterogenous                                           atherosclerotic plaque.  Patent right common carotid  artery                                           stent.                                           Severe (70-99%) stenosis proximal  left                                           internal carotid artery                                           secondary to bulky, heterogeneous  and                                           calcified atherosclerotic                                           plaque  Pre-Surgical Evaluation & Surgical       Bifurcation and ICAICA is normal  past  Correlation                              the stenosis. Anatomy on the left  is                                           within normal limits.Left  bifurcation                                           is located near the mandible.   Performing Technologist: Ralene Cork RVT      Examination Guidelines: A complete evaluation includes B-mode imaging,  spectral  Doppler, color Doppler, and power Doppler as needed of all accessible  portions  of each vessel. Bilateral testing is considered an integral part of a  complete  examination. Limited examinations for reoccurring indications may be  performed  as noted.      Right Carotid Findings:  +----------+--------+--------+--------+------------------+-----------------  ----+            PSV cm/sEDV cm/sStenosisPlaque DescriptionComments                +----------+--------+--------+--------+------------------+-----------------  ----+  CCA Prox  42      12                                                         +----------+--------+--------+--------+------------------+-----------------  ----+  CCA Mid                                             stent                    +----------+--------+--------+--------+------------------+-----------------  ----+  CCA Distal182     24                                post stenotic                                                                turbulence              +----------+--------+--------+--------+------------------+-----------------  ----+  ICA Prox                                            stent                    +----------+--------+--------+--------+------------------+-----------------  ----+  ICA Mid                                             stent                    +----------+--------+--------+--------+------------------+-----------------  ----+  ICA Distal32      8                                                         +----------+--------+--------+--------+------------------+-----------------  ----+  ECA       85      17                                Occluded  proximally                                                        with retrograde                                                              branch filling  of the  ECA                      +----------+--------+--------+--------+------------------+-----------------  ----+   +----------+--------+-------+----------------+-------------------+            PSV cm/sEDV cmsDescribe        Arm Pressure (mmHG)  +----------+--------+-------+----------------+-------------------+  [id number]             Multiphasic, WNL                     +----------+--------+-------+----------------+-------------------+   +---------+--------+---+--------+--+---------+  VertebralPSV cm/s113EDV cm/s20Antegrade   +---------+--------+---+--------+--+---------+   Right Stent(s):  +---------------+--------+--------+---------------+--------+---------------  --+  CCA            PSV cm/sEDV cm/sStenosis       WaveformComments            +---------------+--------+--------+---------------+--------+---------------  --+  Prox to Stent  42      12                                                 +---------------+--------+--------+---------------+--------+---------------  --+  Proximal Stent 54      11                                                 +---------------+--------+--------+---------------+--------+---------------  --+  Mid Stent      204     46      50-75% stenosis        homogenous  plaque  +---------------+--------+--------+---------------+--------+---------------  --+  Distal Stent   230     27                                                 +---------------+--------+--------+---------------+--------+---------------  --+  Distal to Stent182     24                                                 +---------------+--------+--------+---------------+--------+---------------  --+   +---------------+--------+--------+--------+--------+--------+  ICA            PSV cm/sEDV cm/sStenosisWaveformComments  +---------------+--------+--------+--------+--------+--------+  Prox to Stent  182     24                                +---------------+--------+--------+--------+--------+--------+  Proximal Stent 81      17                                +---------------+--------+--------+--------+--------+--------+  Mid Stent      79      18                                +---------------+--------+--------+--------+--------+--------+  Distal Stent   60      15                                +---------------+--------+--------+--------+--------+--------+  Distal to Stent68      17                                 +---------------+--------+--------+--------+--------+--------+       Left Carotid Findings:  +----------+--------+--------+--------+---------------------+--------------  ----+            PSV cm/sEDV cm/sStenosisPlaque Description   Comments              +----------+--------+--------+--------+---------------------+--------------  ----+  CCA Prox  78      14                                                         +----------+--------+--------+--------+---------------------+--------------  ----+  CCA Mid   146     29              heterogenous                               +----------+--------+--------+--------+---------------------+--------------  ----+  CCA Distal164     31                                                         +----------+--------+--------+--------+---------------------+--------------  ----+  ICA Prox  430     155     80-99%  calcific and                                                                 homogeneous                                +----------+--------+--------+--------+---------------------+--------------  ----+  ICA Mid   188     53                                   post stenotic                                                               turbulence            +----------+--------+--------+--------+---------------------+--------------  ----+  ICA Distal52      13                                                         +----------+--------+--------+--------+---------------------+--------------  ----+  ECA  204     23      >50%    homogeneous and                                                              heterogenous                               +----------+--------+--------+--------+---------------------+--------------  ----+   +----------+--------+--------+----------------+-------------------+            PSV cm/sEDV cm/sDescribe        Arm  Pressure (mmHG)  +----------+--------+--------+----------------+-------------------+  [id number]             Multiphasic, WNL                     +----------+--------+--------+----------------+-------------------+   +---------+--------+--+--------+--+---------+  VertebralPSV cm/s68EDV cm/s14Antegrade  +---------+--------+--+--------+--+---------+           Summary:  Right Carotid: 50-75% stenosis mid CCA stent. Patent ICA stent with no  stenosis.                 The ECA is fed via a retrograd branch.   Left Carotid: Velocities in the left ICA are consistent with a 80-99%  stenosis.                The ECA appears >50% stenosed.   Vertebrals:  Bilateral vertebral arteries demonstrate antegrade flow.  Subclavians: Normal flow hemodynamics were seen in bilateral subclavian               arteries.   *See table(s) above for measurements and observations.   CTA personally reviewed. Radiology read as 75% in-stent right common carotid artery stenosis; 65% left carotid bifurcation stenosis. I think the degree of stenosis is much higher than this, likely close to 80%.  Yevonne Aline. Stanford Breed, MD [facility] Phone Number: [phone] 05/05/2021 4:28 PM

## 2021-05-05 ENCOUNTER — Encounter: Payer: Self-pay | Admitting: Vascular Surgery

## 2021-05-05 ENCOUNTER — Ambulatory Visit (HOSPITAL_COMMUNITY)
Admission: RE | Admit: 2021-05-05 | Discharge: 2021-05-05 | Disposition: A | Discharge status: Home / Self Care | Payer: Medicare HMO | Source: Ambulatory Visit | Attending: Vascular Surgery | Admitting: Vascular Surgery

## 2021-05-05 ENCOUNTER — Other Ambulatory Visit: Payer: Self-pay

## 2021-05-05 ENCOUNTER — Ambulatory Visit: Payer: Medicare HMO | Admitting: Vascular Surgery

## 2021-05-05 VITALS — BP 157/83 | HR 70 | Temp 97.9°F | Resp 20 | Ht 66.0 in | Wt 179.0 lb

## 2021-05-05 DIAGNOSIS — I6523 Occlusion and stenosis of bilateral carotid arteries: Secondary | ICD-10-CM | POA: Diagnosis not present

## 2021-05-06 ENCOUNTER — Other Ambulatory Visit: Payer: Self-pay

## 2021-05-06 DIAGNOSIS — I6523 Occlusion and stenosis of bilateral carotid arteries: Secondary | ICD-10-CM

## 2021-06-25 ENCOUNTER — Ambulatory Visit (HOSPITAL_COMMUNITY)
Admission: RE | Admit: 2021-06-25 | Discharge: 2021-06-25 | Disposition: A | Discharge status: Home / Self Care | Payer: Medicare HMO | Source: Ambulatory Visit | Attending: Internal Medicine | Admitting: Internal Medicine

## 2021-06-25 ENCOUNTER — Encounter: Payer: Self-pay | Admitting: Internal Medicine

## 2021-06-25 ENCOUNTER — Ambulatory Visit: Payer: Medicare HMO | Admitting: Internal Medicine

## 2021-06-25 ENCOUNTER — Other Ambulatory Visit: Payer: Self-pay

## 2021-06-25 DIAGNOSIS — R0609 Other forms of dyspnea: Secondary | ICD-10-CM | POA: Insufficient documentation

## 2021-06-25 NOTE — Patient Instructions (Addendum)
Please remember to go to the  x-ray department  @  [facility] for your tests - we will call you with the results when they are available     Pulmonary follow up is as needed

## 2021-06-25 NOTE — Progress Notes (Signed)
Subjective:   Patient ID: Kenneth [name], male    DOB: 1940-10-31,     MRN: [id number]    Brief patient profile:  89   yowm quit smoking 1972 s/p laryngeal Ca RT/surgery and new doe x 2016 referred to pulmonary clinic 12/05/2015 by Kenneth [name] with last eval by Kenneth [name] c/w new polyp and trach placed Aug 2019    History of Present Illness  12/05/2015 1st [name]/ Kenneth [name]   Chief Complaint  Patient presents with   Advice Only    REferred by Kenneth. Willey [name]; SOB, no chest tightness, no cough.  Abnormal PFT done 3/1 in [facility].   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 but does fine slow and flat eg  [employer]  rec Change Nexium to 40 mg (= 2 x 20)   Take  30-60 min before first meal of the day and Pepcid (famotidine)  20 mg one @  bedtime until return to see Kenneth Kenneth [name] and let him know if this helped GERD diet  There is no evidence of any significant lung disease - follow up here is as needed    03/06/2018 trach done at [place] and notes say pt could mow grass but pt says could only do riding mower with baseline doe= 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 Willey [name]. Pt c/o SOB since beginning of Feb 2020. He gets SOB walking short distances such as room to room. He has had to suction his trach more often- bloody yellow sputum with occ foul odor.  He is using his albuterol neb 2 x daily.   onset early Feb 2020 fever x one week comes and goes since then seems better just while in hospital  With w/u by ID during last admit to [facility] with temp to 102 on admit. covid neg/sputum grew out pseudomonas 3/25  p last ID note  Documented prompt response to IV meropenem and no need for  further abx. Pseudomonas was sensitive to cipro. 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 but does fine slow and flat   Cough: yellow/ slt bloody  Sleeping: in recliner  better at 45 degrees x feb 2020 whereas prior to Feb able to sleep in flat bed with a couple of pillows SABA use: maybe twice daily / not sure it helps 02: none  rec Omeprazole 20 mg Take 30- 60 min before your first and last meals of the day  Cipro 500 mg twice daily x 2 weeks  Prednisone 10 mg take  4 each am x 2 days,  2 each am x 2 days,  1 each am x 2 days and stop  Keep using  Humidified trach collar as much as possible esp at night with the plug out  For cough > mucinex dm 1200 every 12 hours and supplement with tylenol #3 one every 4 hours if needed For breathing >  Albuterol nebulizer 2.5 mg every 4 hours and take the trach cap off  Please remember to go  to the lab   department   for your tests - we will call you with the results when they are available.  Please schedule a follow up office visit in 2  weeks, sooner if needed  with all medications /inhalers/ solutions in hand so we can verify exactly what you are taking. 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 but does fine slow and flat  Cough: not following instructions to use mucinex dm or tyl #3 >> minimal mucoid suputum  Sleeping: 45 degrees/ no change since early feb 2020  SABA use: neb does help  02: none  rec I will ask Kenneth [name] to address the issue with humidified trach collar at bedtime   GERD diet  Prednisone 10 mg take  4 each am x 2 days,   2 each am x 2 days,  1 each am x 2 days and stop  For cough > mucinex dm 1200 every 12 hours and supplement with tylenol #3  One-half  every 4 hours if needed For breathing >  Albuterol nebulizer 2.5 mg every 4 hours and take the trach cap off  Please remember to go to the  x-ray department  for your tests - we will call you with the results when they are available     12/19/2018  f/u ov/Kenneth [name] re:  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 He is coughing more-yellow to clear sputum when he suctions.   Dyspnea:  Walking around the house /legs are limiting Cough: "real dry" (not using humidfier and using guaifensin produce with antihistamines against advice)  Sleeping: 45 elevation/ if not coughs  SABA use  twice daily  Neb avg rec Ok to use nebulizer before heavy exertion to see if helps. Stop the Tussin and use mucinex dm up to 1200 mg every 12 hours as needed  Please schedule a follow up office visit in 4 weeks, sooner if needed with cxr on return          03/25/2021  f/u ov/[place] office/Kenneth [name] re:  [name]  Chief Complaint  Patient presents with   Follow-up    Breathing is about the same. Spouse has noticed increased cough x 3 months- non prod and worse in the evenings.   Dyspnea:  foodlion walking about the same breathing better with pmv out s02 Cough: worse p supper  Sleeping: bed blocks 4 in / 2 pillows, sometimes recliner 30 degrees/ leaves pmv out and no humidity/ mucus thick white in am  SABA use: none  02: none  Covid status: vax x 3  Rec Pantoprazole (protonix) 40 mg   Take  30-60 min before first meal of the day and Pepcid (famotidine)  20 mg after supper until return to office   GERD diet reviewed, bed blocks rec  For cough/ congestion > mucinex dm 1200  mg every 12 hours and maximum humidity  Please  remember to go to the  x-ray department > scarring/ no acute change    06/25/2021  f/u ov/[place] office/Kenneth [name] re: trach dep p RT maint on no rx   Chief Complaint  Patient presents with   Follow-up    Dry cough and SOB are about the same since last OV.    Dyspnea:  9 holes of golf/more walking than prior s 02 Cough: better but still has trouble with thick mucus  Sleeping: bed blocks / 2 pillows sometimes in chair  SABA use: none  02: none  Covid status: vax x 3  Pain with deep breath R ant x one month     No obvious day to day or daytime variability or assoc excess/ purulent sputum or mucus plugs or hemoptysis or cp or chest tightness, subjective wheeze or overt sinus or hb symptoms.     Also denies any obvious fluctuation of symptoms with weather or environmental changes or other aggravating or alleviating factors except as outlined above   No unusual exposure hx or h/o childhood pna/ asthma or knowledge of premature birth.  Current Allergies, Complete Past Medical History, Past Surgical History, Family History, and Social History were reviewed in [employer] record.  ROS  The following are not active complaints unless bolded Hoarseness, sore throat, dysphagia, dental problems, itching, sneezing,  nasal congestion or discharge of excess mucus or purulent secretions, ear ache,   fever, chills, sweats, unintended wt loss or wt gain, classically pleuritic or exertional cp,  orthopnea pnd or arm/hand swelling  or leg swelling, presyncope, palpitations, abdominal pain, anorexia, nausea, vomiting, diarrhea  or change in bowel habits or change in bladder habits, change in stools or change in urine, dysuria, hematuria,  rash, arthralgias, visual complaints, headache, numbness, weakness or ataxia or problems with walking or coordination,  change in mood or  memory.        Current Meds  Medication Sig   acetaminophen (TYLENOL) 500 MG tablet Take 500 mg by mouth every 6  (six) hours as needed.   Alirocumab (PRALUENT) 75 MG/ML SOAJ Inject 1 pen into the skin every 14 (fourteen) days.   amLODipine (NORVASC) 10 MG tablet Take 10 mg by mouth daily.   aspirin EC 81 MG tablet Take 325 mg by mouth daily. Swallow whole.   clopidogrel (PLAVIX) 75 MG tablet Take 1 tablet by mouth once daily   ezetimibe (ZETIA) 10 MG tablet Take 1 tablet by mouth once daily   famotidine (PEPCID) 20 MG tablet One after supper   pantoprazole (PROTONIX) 40 MG tablet Take 1 tablet (40 mg total) by mouth daily.   PARoxetine (PAXIL) 20 MG tablet Take 20 mg by mouth at bedtime.   spironolactone (ALDACTONE) 25 MG tablet Take 25 mg by mouth daily.                    Objective:   Physical Exam  06/25/2021        178  03/25/2021        180  10/12/2019        189  04/19/2019        178  01/16/2019        179  12/19/2018       178 12/04/2018        176   11/10/2018       172  12/05/15 182 lb 9.6 oz (82.827 kg)  08/07/15 182  lb (82.555 kg)  07/18/15 176 lb (79.833 kg)     Vital signs reviewed  06/25/2021  - Note at rest 02 sats  95% on RA   General appearance:    hoarse amb wm, speaks by occluding trach   HEENT : pt wearing mask not removed for exam due to covid - 19 concerns.   NECK :  without JVD/Nodes/TM/ nl carotid upstrokes bilaterally   LUNGS: no acc muscle use,  Min barrel  contour chest wall with bilateral  min insp/exp rhonchi  and  without cough on insp or exp maneuvers and min  Hyperresonant  to  percussion bilaterally     CV:  RRR  no s3 or murmur or increase in P2, and no edema   ABD:  soft and nontender with pos end  insp Hoover's  in the supine position. No bruits or organomegaly appreciated, bowel sounds nl  MS:   Nl gait/  ext warm without deformities, calf tenderness, cyanosis or clubbing No obvious joint restrictions   SKIN: warm and dry without lesions    NEURO:  alert, approp, nl sensorium with  no motor or cerebellar deficits apparent.         CXR PA  and Lateral:   06/25/2021 :    I personally reviewed images and agree with radiology impression as follows:    1. Cardiomegaly with mild, diffuse interstitial pulmonary opacity, similar to prior examination, which may reflect edema and/or chronic interstitial change. No new or focal airspace opacity.  2.  Tracheostomy.             Assessment & Plan:

## 2021-06-27 ENCOUNTER — Encounter: Payer: Self-pay | Admitting: Internal Medicine

## 2021-06-27 NOTE — Assessment & Plan Note (Addendum)
Quit smoking 1972  PFT's  09/24/15   FEV1 2.08(82 % ) ratio 81  p 10 % improvement from saba p no rx prior to study with DLCO  58 % correct to 61 % for alv volume   - 12/05/2015  Walked RA x 3 laps @ 185 ft each stopped due to  End of study, nl pace, no desat,  Mild sob - Spirometry 12/05/2015  FEV1 1.78 (64%)  Ratio 74  s truncation - 12/04/2018   Walked RA  2 laps @  approx 232ft each @ mod fast pace  stopped due to  End of study, second lap s trach plug helped doe and allowed faster walk   - 12/19/2018   [facility] RA x one lap =  approx 250 ft - stopped due to  Legs gave out with sats still 98% at moderate pace - HRCT 12/11/2018 neg for uip, suggestive of prior inflammatory injury as improved vs priors  - 10/12/2019   Walked RA x two laps =  approx 576ft @ nl pace - stopped due to end of study, min sob  with sats of 91 % at the end of the study and much better with PMV removed  - 03/25/2021   Walked on RA x  3  lap(s) =  approx 450 @ slow to moderate pace, stopped due to end of study, min sob  with lowest 02 sats 96 %   Well compensated/ tol trach better now and has local regular ent f/u so no pulmonary f/u needed at this point as most of his symptoms were related to upper aiway obst and have been bypassed by the trach   Pulmonary f/u prn          Each maintenance medication was reviewed in detail including emphasizing most importantly the difference between maintenance and prns and under what circumstances the prns are to be triggered using an action plan format where appropriate.  Total time for H and P, chart review, counseling, and generating customized AVS unique to this office visit / same day charting = 53min

## 2021-06-28 ENCOUNTER — Other Ambulatory Visit: Payer: Self-pay | Admitting: Cardiology

## 2021-07-20 ENCOUNTER — Other Ambulatory Visit: Payer: Self-pay | Admitting: Cardiology

## 2021-07-26 ENCOUNTER — Other Ambulatory Visit: Payer: Self-pay | Admitting: Cardiology

## 2021-07-27 ENCOUNTER — Emergency Department (HOSPITAL_COMMUNITY): Payer: Medicare HMO

## 2021-07-27 ENCOUNTER — Other Ambulatory Visit: Payer: Self-pay

## 2021-07-27 ENCOUNTER — Ambulatory Visit
Admission: EM | Admit: 2021-07-27 | Discharge: 2021-07-27 | Disposition: A | Discharge status: Other Acute Inpatient Hospital | Payer: Medicare HMO

## 2021-07-27 ENCOUNTER — Emergency Department (HOSPITAL_COMMUNITY)
Admission: EM | Admit: 2021-07-27 | Discharge: 2021-07-27 | Disposition: A | Discharge status: Home / Self Care | Payer: Medicare HMO | Attending: Emergency Medicine | Admitting: Emergency Medicine

## 2021-07-27 DIAGNOSIS — D72829 Elevated white blood cell count, unspecified: Secondary | ICD-10-CM | POA: Insufficient documentation

## 2021-07-27 DIAGNOSIS — R000 Tachycardia, unspecified: Secondary | ICD-10-CM | POA: Diagnosis not present

## 2021-07-27 DIAGNOSIS — Z7902 Long term (current) use of antithrombotics/antiplatelets: Secondary | ICD-10-CM | POA: Insufficient documentation

## 2021-07-27 DIAGNOSIS — Z8521 Personal history of malignant neoplasm of larynx: Secondary | ICD-10-CM | POA: Diagnosis not present

## 2021-07-27 DIAGNOSIS — Z7982 Long term (current) use of aspirin: Secondary | ICD-10-CM | POA: Diagnosis not present

## 2021-07-27 DIAGNOSIS — R059 Cough, unspecified: Secondary | ICD-10-CM | POA: Diagnosis present

## 2021-07-27 DIAGNOSIS — U071 COVID-19: Secondary | ICD-10-CM

## 2021-07-27 LAB — CBC WITH DIFFERENTIAL/PLATELET
Abs Immature Granulocytes: 0.06 10*3/uL (ref 0.00–0.07)
Basophils Absolute: 0.1 10*3/uL (ref 0.0–0.1)
Basophils Relative: 1 %
Eosinophils Absolute: 0.2 10*3/uL (ref 0.0–0.5)
Eosinophils Relative: 2 %
HCT: 42.7 % (ref 39.0–52.0)
Hemoglobin: 14 g/dL (ref 13.0–17.0)
Immature Granulocytes: 1 %
Lymphocytes Relative: 6 %
Lymphs Abs: 0.7 10*3/uL (ref 0.7–4.0)
MCH: 30 pg (ref 26.0–34.0)
MCHC: 32.8 g/dL (ref 30.0–36.0)
MCV: 91.6 fL (ref 80.0–100.0)
Monocytes Absolute: 0.9 10*3/uL (ref 0.1–1.0)
Monocytes Relative: 8 %
Neutro Abs: 8.9 10*3/uL — ABNORMAL HIGH (ref 1.7–7.7)
Neutrophils Relative %: 82 %
Platelets: 316 10*3/uL (ref 150–400)
RBC: 4.66 MIL/uL (ref 4.22–5.81)
RDW: 14.6 % (ref 11.5–15.5)
WBC: 10.8 10*3/uL — ABNORMAL HIGH (ref 4.0–10.5)
nRBC: 0 % (ref 0.0–0.2)

## 2021-07-27 LAB — RESP PANEL BY RT-PCR (FLU A&B, COVID) ARPGX2
Influenza A by PCR: NEGATIVE
Influenza B by PCR: NEGATIVE
SARS Coronavirus 2 by RT PCR: POSITIVE — AB

## 2021-07-27 LAB — BASIC METABOLIC PANEL
Anion gap: 10 (ref 5–15)
BUN: 13 mg/dL (ref 8–23)
CO2: 27 mmol/L (ref 22–32)
Calcium: 9 mg/dL (ref 8.9–10.3)
Chloride: 97 mmol/L — ABNORMAL LOW (ref 98–111)
Creatinine, Ser: 1.38 mg/dL — ABNORMAL HIGH (ref 0.61–1.24)
GFR, Estimated: 51 mL/min — ABNORMAL LOW (ref >60)
Glucose, Bld: 107 mg/dL — ABNORMAL HIGH (ref 70–99)
Potassium: 4.1 mmol/L (ref 3.5–5.1)
Sodium: 134 mmol/L — ABNORMAL LOW (ref 135–145)

## 2021-07-27 MED ORDER — NIRMATRELVIR/RITONAVIR (PAXLOVID) TABLET (RENAL DOSING): 2.0000 | ORAL_TABLET | Freq: Two times a day (BID) | ORAL | 0 refills | Status: AC

## 2021-07-27 NOTE — ED Provider Notes (Signed)
[facility] EMERGENCY DEPARTMENT Provider Note   CSN: [id number] Arrival date & time: 07/27/21  1534     History  Chief Complaint  Patient presents with   Cough   Shortness of Breath    Kenneth [name] is a 81 y.o. male with history of laryngeal cancer s/p tracheostomy reports Covid exposure a few days ago, now he has cough and SOB. He has some chronic SOB, but does not wear oxygen. No CP or leg swelling. Wife is here for similar symptoms. Patient and wife initially went to UC but patient was reportedly hypoxic there and so sent to the ED for evaluation.   HPI     Home Medications Prior to Admission medications   Medication Sig Start Date End Date Taking? Authorizing Provider  nirmatrelvir/ritonavir EUA, renal dosing, (PAXLOVID) 10 x 150 MG & 10 x 100MG  TABS Take 2 tablets by mouth 2 (two) times daily for 5 days. Take nirmatrelvir (150 mg) one tablet twice daily for 5 days and ritonavir (100 mg) one tablet twice daily for 5 days. 07/27/21 08/01/21 Yes Truddie Hidden, MD  acetaminophen (TYLENOL) 500 MG tablet Take 500 mg by mouth every 6 (six) hours as needed.    [provider]  Alirocumab (PRALUENT) 75 MG/ML SOAJ Inject 1 pen into the skin every 14 (fourteen) days. 07/04/20   Arnoldo Lenis, MD  amLODipine (NORVASC) 10 MG tablet Take 1 tablet by mouth once daily 06/29/21   Arnoldo Lenis, MD  aspirin EC 81 MG tablet Take 325 mg by mouth daily. Swallow whole.    [provider]  clopidogrel (PLAVIX) 75 MG tablet Take 1 tablet by mouth once daily 03/16/21   Arnoldo Lenis, MD  ezetimibe (ZETIA) 10 MG tablet Take 1 tablet by mouth once daily 12/08/20   Arnoldo Lenis, MD  famotidine (PEPCID) 20 MG tablet One after supper 03/25/21   Tanda Rockers, MD  nitroGLYCERIN (NITROSTAT) 0.4 MG SL tablet Place 1 tablet (0.4 mg total) under the tongue every 5 (five) minutes as needed for chest pain. 01/31/17 05/25/21  Lendon Colonel, NP  pantoprazole (PROTONIX)  40 MG tablet Take 1 tablet by mouth once daily 07/21/21   Arnoldo Lenis, MD  PARoxetine (PAXIL) 20 MG tablet Take 20 mg by mouth at bedtime.    [provider]  spironolactone (ALDACTONE) 25 MG tablet Take 25 mg by mouth daily. 03/16/21   [provider]      Allergies    Nifedipine, Lorazepam, Other, Penicillins, Sulfacetamide, Sulfonamide derivatives, Erythromycin, Statins, and Vancomycin    Review of Systems   Review of Systems  Constitutional:  Negative for fever.  Respiratory:  Positive for cough and shortness of breath.    Physical Exam Updated Vital Signs BP (!) 154/79    Pulse 100    Temp 100.1 F (37.8 C) (Oral)    Resp 20    Ht 5\' 6"  (1.676 m)    Wt 80.8 kg    SpO2 98%    BMI 28.75 kg/m  Physical Exam Vitals and nursing note reviewed.  Constitutional:      Appearance: Normal appearance.  HENT:     Head: Normocephalic and atraumatic.     Nose: Nose normal.     Mouth/Throat:     Mouth: Mucous membranes are moist.  Eyes:     Extraocular Movements: Extraocular movements intact.     Conjunctiva/sclera: Conjunctivae normal.  Neck:     Comments: Tracheostomy in  place Cardiovascular:     Rate and Rhythm: Normal rate.  Pulmonary:     Effort: Pulmonary effort is normal.     Breath sounds: Normal breath sounds.  Abdominal:     General: Abdomen is flat.     Palpations: Abdomen is soft.     Tenderness: There is no abdominal tenderness.  Musculoskeletal:        General: No swelling. Normal range of motion.     Cervical back: Neck supple.  Skin:    General: Skin is warm and dry.  Neurological:     General: No focal deficit present.     Mental Status: He is alert.  Psychiatric:        Mood and Affect: Mood normal.    ED Results / Procedures / Treatments   Labs (all labs ordered are listed, but only abnormal results are displayed) Labs Reviewed  RESP PANEL BY RT-PCR (FLU A&B, COVID) ARPGX2 - Abnormal; Notable for the following components:       Result Value   SARS Coronavirus 2 by RT PCR POSITIVE (*)    All other components within normal limits  BASIC METABOLIC PANEL - Abnormal; Notable for the following components:   Sodium 134 (*)    Chloride 97 (*)    Glucose, Bld 107 (*)    Creatinine, Ser 1.38 (*)    GFR, Estimated 51 (*)    All other components within normal limits  CBC WITH DIFFERENTIAL/PLATELET - Abnormal; Notable for the following components:   WBC 10.8 (*)    Neutro Abs 8.9 (*)    All other components within normal limits    EKG EKG Interpretation  Date/Time:  Monday July 27 2021 15:55:19 EST Ventricular Rate:  99 PR Interval:  149 QRS Duration: 127 QT Interval:  372 QTC Calculation: 478 R Axis:   4 Text Interpretation: Sinus tachycardia Atrial premature complex Right bundle branch block Inferior infarct, old Baseline wander in lead(s) V4 Since last tracing Rate faster Confirmed by Calvert Cantor [phone]) on 07/27/2021 4:27:42 PM  Radiology DG Chest Port 1 View  Result Date: 07/27/2021 CLINICAL DATA:  Cough and shortness of breath. EXAM: PORTABLE CHEST 1 VIEW COMPARISON:  Chest x-ray 06/25/2021 FINDINGS: The tracheostomy tube is in good position, unchanged. The cardiac silhouette, mediastinal and hilar contours are within normal limits and unchanged. Stable surgical changes from bypass surgery. Chronic pulmonary scarring changes without definite acute overlying pulmonary process. IMPRESSION: Chronic lung changes without definite acute overlying pulmonary process. Electronically Signed   By: Marijo Sanes M.D.   On: 07/27/2021 16:28    Procedures Procedures    Medications Ordered in ED Medications - No data to display  ED Course/ Medical Decision Making/ A&P Clinical Course as of 07/27/21 1908  Mon Jul 27, 2021  1634 CXR images and results reviewed without acute process.  [CS]  2355 CBC with mild leukocytosis, otherwise normal.  [CS]  1722 BMP with CKD at baseline.  [CS]  1725 Patient's Covid/Flu test  is still pending but he does not want to sit in the ED room by himself. He would like to go sit with his wife. He is not hypoxic and is in no distress here.  [CS]  1906 Patient's Covid test is confirmed positive. He is no longer in the room with his wife. Will rx renal dosed paxlovid. Wife is aware and will communicate that to him.  [CS]    Clinical Course User Index [CS] Truddie Hidden, MD  Medical Decision Making Problems Addressed: COVID-19: acute illness or injury  Amount and/or Complexity of Data Reviewed Labs: ordered. Decision-making details documented in ED Course. Radiology: ordered and independent interpretation performed. Decision-making details documented in ED Course. ECG/medicine tests: ordered and independent interpretation performed.  Risk Prescription drug management.   Patient here with SOB, cough and Covid exposure. He is not hypoxic here. But he is breathing fast. Will check labs and CXR, covid swab.         Final Clinical Impression(s) / ED Diagnoses Final diagnoses:  [id number]    Rx / DC Orders ED Discharge Orders          Ordered    nirmatrelvir/ritonavir EUA, renal dosing, (PAXLOVID) 10 x 150 MG & 10 x 100MG  TABS  2 times daily       Note to Pharmacy: Patient GFR is 51.   07/27/21 1906              Truddie Hidden, MD 07/27/21 Einar Crow

## 2021-07-27 NOTE — ED Triage Notes (Signed)
Per wife, pt started having chills, shortness of breath and diarrhea, started today. Per wife pt was exposed to COVID 4 days ago.

## 2021-07-27 NOTE — ED Triage Notes (Signed)
Chills, sob, and diarrhea x 2 days.

## 2021-07-27 NOTE — ED Provider Notes (Signed)
I saw patient through the triage process.  Concern was that he has COVID-19.  Had exposure to it from his granddaughter about 4 days ago.  He has subsequently developed shortness of breath, chills and worsening difficulty with his breathing.  They did an at-home COVID test and was negative.  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  He also has heart disease, a history of CABG, NSTEMI and stents placed.  During triage she was found to have a pulse oximetry that fluctuated between 85-92%.  Given his risk factors and high likelihood that he has COVID, recommended further evaluation and intervention in the hospital setting especially given his history of respiratory failure.  Patient presents with his wife who is agreeable to taking him there now.   Jaynee Eagles, PA-C 07/27/21 1524

## 2021-07-27 NOTE — ED Notes (Signed)
Iv taken out by Idelle Crouch, RN. Pt is in room 12 with wife. Discharge papers given and medications reviewed.

## 2021-07-30 ENCOUNTER — Other Ambulatory Visit: Payer: Self-pay | Admitting: Cardiology

## 2021-09-01 ENCOUNTER — Encounter: Payer: Self-pay | Admitting: Student

## 2021-09-02 ENCOUNTER — Encounter: Payer: Self-pay | Admitting: Student

## 2021-09-08 ENCOUNTER — Other Ambulatory Visit (HOSPITAL_COMMUNITY)
Admission: RE | Admit: 2021-09-08 | Discharge: 2021-09-08 | Disposition: A | Discharge status: Home / Self Care | Payer: Medicare HMO | Source: Ambulatory Visit | Attending: Student | Admitting: Student

## 2021-09-08 ENCOUNTER — Other Ambulatory Visit: Payer: Self-pay

## 2021-09-08 ENCOUNTER — Encounter: Payer: Self-pay | Admitting: Student

## 2021-09-08 ENCOUNTER — Ambulatory Visit (INDEPENDENT_AMBULATORY_CARE_PROVIDER_SITE_OTHER): Payer: Medicare HMO | Admitting: Student

## 2021-09-08 VITALS — BP 130/80 | HR 69 | Ht 66.0 in | Wt 182.2 lb

## 2021-09-08 DIAGNOSIS — I10 Essential (primary) hypertension: Secondary | ICD-10-CM

## 2021-09-08 DIAGNOSIS — Z79899 Other long term (current) drug therapy: Secondary | ICD-10-CM

## 2021-09-08 DIAGNOSIS — I2510 Atherosclerotic heart disease of native coronary artery without angina pectoris: Secondary | ICD-10-CM

## 2021-09-08 DIAGNOSIS — E785 Hyperlipidemia, unspecified: Secondary | ICD-10-CM

## 2021-09-08 DIAGNOSIS — I6523 Occlusion and stenosis of bilateral carotid arteries: Secondary | ICD-10-CM

## 2021-09-08 DIAGNOSIS — R001 Bradycardia, unspecified: Secondary | ICD-10-CM

## 2021-09-08 LAB — BASIC METABOLIC PANEL
Anion gap: 12 (ref 5–15)
BUN: 15 mg/dL (ref 8–23)
CO2: 22 mmol/L (ref 22–32)
Calcium: 8.6 mg/dL — ABNORMAL LOW (ref 8.9–10.3)
Chloride: 103 mmol/L (ref 98–111)
Creatinine, Ser: 1.54 mg/dL — ABNORMAL HIGH (ref 0.61–1.24)
GFR, Estimated: 45 mL/min — ABNORMAL LOW (ref >60)
Glucose, Bld: 98 mg/dL (ref 70–99)
Potassium: 4.1 mmol/L (ref 3.5–5.1)
Sodium: 137 mmol/L (ref 135–145)

## 2021-09-08 LAB — CBC
HCT: 38.2 % — ABNORMAL LOW (ref 39.0–52.0)
Hemoglobin: 12.4 g/dL — ABNORMAL LOW (ref 13.0–17.0)
MCH: 30.3 pg (ref 26.0–34.0)
MCHC: 32.5 g/dL (ref 30.0–36.0)
MCV: 93.4 fL (ref 80.0–100.0)
Platelets: 310 10*3/uL (ref 150–400)
RBC: 4.09 MIL/uL — ABNORMAL LOW (ref 4.22–5.81)
RDW: 16.3 % — ABNORMAL HIGH (ref 11.5–15.5)
WBC: 8.8 10*3/uL (ref 4.0–10.5)
nRBC: 0 % (ref 0.0–0.2)

## 2021-09-08 NOTE — Progress Notes (Addendum)
Cardiology Office Note    Date:  09/08/2021   ID:  Kenneth [name], DOB 1941/02/24, MRN [id number]  PCP:  Kenneth Noble, MD  Cardiologist: Kenneth Dolly, MD    Chief Complaint  Patient presents with   Hospitalization Follow-up    History of Present Illness:    Kenneth [name] is a 81 y.o. male  with past medical history of CAD (s/p CABG in 2003, patent grafts by cath in 2007, s/p inferior STEMI in 02/2020 with DES to RCA, s/p NSTEMI in 05/2020 with DES to proximal-RCA, s/p NSTEMI in 11/2020 with balloon angioplasty to mid-RCA), baseline bradycardia, HTN, HLD, carotid artery stenosis (s/p stenting of right carotid in 2013), laryngeal carcinoma (s/p tracheostomy placement in 02/2018) and prior CVA who presents to the office today for hospital follow-up.  He was last examined by Dr. [name] in 02/2021 and denied any recent chest pain but did report chronic dyspnea which he contributed to his tracheostomy. It had been recommended to continue on DAPT given his significant coronary disease and he was on high-dose ASA 325 mg daily along with Plavix per Neurology given his prior CVA's and carotid disease.   In talking with the patient and his wife today, he was admitted for a recurrent STEMI at [facility] last week. We had previously requested records but they were not available at the time of his visit. His wife did bring a picture with her today from his hospitalization and it appears he required 2-3 stents to his RCA (stent cards not available). He reports they performed an echo but is unsure what this showed. He was switched form Plavix to Brilinta and ASA was reduced to 81mg  once daily. Appears he was also started on Losartan and Coreg with Lopressor being discontinued. He reports overall doing well since returning home. Denies any recurrent chest pain. Has baseline dyspnea but no acute changes. No recent orthopnea, PND or pitting edema. He did experience some mild swelling along his right  femoral cath site during admission.    Past Medical History:  Diagnosis Date   Acute ST elevation myocardial infarction (STEMI) of inferior wall ([place]) 03/01/2020   [facility]   Anxiety    Arthritis    Back (05/04/2018)   Carotid artery disease ([place])    a. h/o R carotid stent.   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   Peripheral vascular disease ([place])    Sinus bradycardia    Stroke [facility]    "he's had several little strokes; many that he wasn't aware of" (05/04/2018)   Tobacco abuse    Tracheal stenosis     Past Surgical History:  Procedure Laterality Date   CAROTID STENT Right 06-13-12; 05/04/2018   CAROTID STENT INSERTION N/A 06/13/2012   Procedure: CAROTID STENT INSERTION;  Surgeon: Serafina Mitchell, MD;  Location: [facility] CATH [facility];  Service: Cardiovascular;  Laterality: N/A;   CATARACT EXTRACTION, BILATERAL Bilateral  COLONOSCOPY  11/10/2011   Dr. [name]: hemorrhoids, tubular adenoma   CORNEAL TRANSPLANT Bilateral    CORONARY ARTERY BYPASS GRAFT  ~ 2003   "CABG X3"   CORONARY BALLOON ANGIOPLASTY N/A 11/26/2020   Procedure: CORONARY BALLOON ANGIOPLASTY;  Surgeon: Troy Sine, MD;  Location: [facility] CV [facility];  Service: Cardiovascular;  Laterality: N/A;   CORONARY STENT INTERVENTION N/A 06/05/2020   Procedure: CORONARY STENT INTERVENTION;  Surgeon: Lorretta Harp, MD;  Location: [facility] CV [facility];  Service: Cardiovascular;  Laterality: N/A;   EYE SURGERY     INSERTION OF RETROGRADE  CAROTID STENT Right 05/04/2018   Procedure: INSERTION OF RIGHT  CAROTID STENT using an ABBOT- XACT carotid stent system;  Surgeon: Serafina Mitchell, MD;  Location: [facility];  Service: Vascular;  Laterality: Right;   LAPAROSCOPIC CHOLECYSTECTOMY  2007   LEFT HEART CATH AND CORS/GRAFTS ANGIOGRAPHY N/A 10/07/2016   Procedure: Left Heart Cath and Cors/Grafts Angiography;  Surgeon: Troy Sine, MD;  Location: [facility] CV [facility];  Service: Cardiovascular;  Laterality: N/A;   LEFT HEART CATH AND CORS/GRAFTS ANGIOGRAPHY N/A 06/05/2020   Procedure: LEFT HEART CATH AND CORS/GRAFTS ANGIOGRAPHY;  Surgeon: Lorretta Harp, MD;  Location: [facility];  Service: Cardiovascular;  Laterality: N/A;   LEFT HEART CATH AND CORS/GRAFTS ANGIOGRAPHY N/A 11/26/2020   Procedure: LEFT HEART CATH AND CORS/GRAFTS ANGIOGRAPHY;  Surgeon: Troy Sine, MD;  Location: [facility] CV [facility];  Service: Cardiovascular;  Laterality: N/A;   PARTIAL LARYNGECTOMY  1998   TRACHEOSTOMY  03/13/2018   "@ [place]"    Current Medications: Outpatient Medications Prior to Visit  Medication Sig Dispense Refill   acetaminophen (TYLENOL) 500 MG tablet Take 500 mg by mouth every 6 (six) hours as needed.     Alirocumab (PRALUENT) 75 MG/ML SOAJ Inject 1 pen into the skin every 14 (fourteen) days. 2 mL 11   amLODipine (NORVASC) 10 MG tablet Take 1 tablet by mouth once daily 90 tablet 3   aspirin EC 81 MG tablet Take 81 mg by mouth daily. Swallow whole.     BRILINTA 90 MG TABS tablet SMARTSIG:1 Tablet(s) By Mouth Every 12 Hours     carvedilol (COREG) 6.25 MG tablet Take 6.25 mg by mouth 2 (two) times daily.     losartan (COZAAR) 25 MG tablet Take 25 mg by mouth daily.     nitroGLYCERIN (NITROSTAT) 0.4 MG SL tablet Place 1 tablet (0.4 mg total) under the tongue every 5 (five) minutes as needed for chest pain. 25 tablet 3   pantoprazole (PROTONIX) 40 MG tablet Take 1 tablet by mouth once daily 90 tablet 2   clopidogrel (PLAVIX) 75 MG tablet  Take 1 tablet by mouth once daily 90 tablet 3   famotidine (PEPCID) 20 MG tablet One after supper 30 tablet 11   ezetimibe (ZETIA) 10 MG tablet Take 1 tablet by mouth once daily (Patient not taking: Reported on 09/08/2021) 90 tablet 1   PARoxetine (PAXIL) 20 MG tablet Take 20 mg by mouth at bedtime. (Patient not taking: Reported on 09/08/2021)     spironolactone (ALDACTONE) 25 MG tablet Take 25 mg by mouth daily. (Patient not taking: Reported on 09/08/2021)     No facility-administered medications prior to visit.     Allergies:   Nifedipine, Lorazepam, Other, Penicillins, Sulfacetamide, Sulfonamide derivatives, Erythromycin, Statins, and Vancomycin   Social History   Socioeconomic History   Marital status: Married    Spouse name: Not on file  Number of children: Not on file   Years of education: Not on file   Highest education level: Not on file  Occupational History   Occupation: retired    Comment: Warden/ranger  Tobacco Use   Smoking status: Former    Packs/day: 1.00    Years: 15.00    Pack years: 15.00    Types: Cigarettes    Start date: 04/22/1953    Quit date: 07/26/1970    Years since quitting: 73.1   Smokeless tobacco: Former    Types: Chew    Quit date: 05/05/2002  Vaping Use   Vaping Use: Never used  Substance and Sexual Activity   Alcohol use: Never    Alcohol/week: 0.0 standard drinks   Drug use: Never   Sexual activity: Not Currently  Other Topics Concern   Not on file  Social History Narrative   Walks on the treadmill every other day at the [facility] for the last couple of weeks.         Social Determinants of Health   Financial Resource Strain: Not on file  Food Insecurity: Not on file  Transportation Needs: Not on file  Physical Activity: Not on file  Stress: Not on file  Social Connections: Not on file     Family History:  The patient's family history includes Cancer in his brother and brother; Dementia in his mother; Heart disease in his brother and  father; Hyperlipidemia in his brother.   Review of Systems:    Please see the history of present illness.     All other systems reviewed and are otherwise negative except as noted above.   Physical Exam:    VS:  BP 130/80    Pulse 69    Ht 5\' 6"  (1.676 m)    Wt 182 lb 3.2 oz (82.6 kg)    SpO2 98%    BMI 29.41 kg/m    General: Pleasant elderly male appearing in no acute distress. Head: Normocephalic, atraumatic. Neck: Bilateral carotid bruits. Trach in place.  Lungs: Respirations regular and unlabored, without wheezes or rales.  Heart: Regular rate and rhythm. No S3 or S4.  No murmur, no rubs, or gallops appreciated. Abdomen: Appears non-distended. No obvious abdominal masses. Msk:  Strength and tone appear normal for age. No obvious joint deformities or effusions. Extremities: No clubbing or cyanosis. No pitting edema.  Distal pedal pulses are 2+ bilaterally. Mild swelling along right femoral region and consistent with a firm hematoma.  Neuro: Alert and oriented X 3. Moves all extremities spontaneously. No focal deficits noted. Psych:  Responds to questions appropriately with a normal affect. Skin: No rashes or lesions noted  Wt Readings from Last 3 Encounters:  09/08/21 182 lb 3.2 oz (82.6 kg)  07/27/21 178 lb 2.1 oz (80.8 kg)  06/25/21 178 lb 1.9 oz (80.8 kg)     Studies/Labs Reviewed:   EKG:  EKG is ordered today.  The ekg ordered today demonstrates NSR, HR 69 with PAC's, evolving ST changes along the inferior leads and diffuse TWI.   Recent Labs: 12/12/2020: TSH 3.010 12/19/2020: ALT 15; Magnesium 2.2 09/08/2021: BUN 15; Creatinine, Ser 1.54; Hemoglobin 12.4; Platelets 310; Potassium 4.1; Sodium 137   Lipid Panel    Component Value Date/Time   CHOL 91 12/19/2020 0637   CHOL 110 09/26/2020 0822   TRIG 239 (H) 12/19/2020 0637   HDL 33 (L) 12/19/2020 0637   HDL 34 (L) 09/26/2020 0822   CHOLHDL 2.8 12/19/2020 0637   VLDL  48 (H) 12/19/2020 0637   LDLCALC 10 12/19/2020  0637   LDLCALC 44 09/26/2020 0822    Additional studies/ records that were reviewed today include:   LHC: 05/2020 Ost LM to Mid LM lesion is 95% stenosed. Ost Cx to Prox Cx lesion is 99% stenosed. Prox LAD lesion is 90% stenosed. Previously placed Prox RCA to Mid RCA stent (unknown type) is widely patent. Ost RCA to Prox RCA lesion is 95% stenosed. RPDA-1 lesion is 70% stenosed. RPDA-2 lesion is 70% stenosed. IMPRESSION: Successful proximal RCA PCI drug-eluting stenting in the setting of non-STEMI.  The previously placed stent in the mid RCA was widely patent.  This was placed in [place] in August of this year.  The sequential LIMA to the diagonal and LAD was patent.  The RIMA was not well visualized.  His LVEDP was 21.  The sheath will be removed once ACT falls below 170 and pressure held.  Patient be hydrated overnight, discharged home in the morning.  He left the lab in stable condition.   Echocardiogram: 11/2020 IMPRESSIONS     1. Left ventricular ejection fraction, by estimation, is 65 to 70%. The  left ventricle has normal function. The left ventricle has no regional  wall motion abnormalities. Left ventricular diastolic parameters were  normal.   2. Right ventricular systolic function is normal. The right ventricular  size is normal. There is normal pulmonary artery systolic pressure. The  estimated right ventricular systolic pressure is 98.3 mmHg.   3. Left atrial size was mildly dilated.   4. The mitral valve is grossly normal. Trivial mitral valve  regurgitation.   5. The aortic valve is tricuspid. There is moderate calcification of the  aortic valve. Aortic valve regurgitation is not visualized. Mild to  moderate aortic valve sclerosis/calcification is present, without any  evidence of aortic stenosis. Aortic valve  mean gradient measures 7.0 mmHg.   6. The inferior vena cava is normal in size with greater than 50%  respiratory variability, suggesting right  atrial pressure of 3 mmHg.   Event Monitor: 12/2020 Rare supraventricular ectopy. Rare episodes of SVT up to 12 seconds. Rare ventricular ectopy No reported symptoms  Assessment:    1. Coronary artery disease involving native coronary artery of native heart without angina pectoris   2. Essential hypertension   3. Hyperlipidemia LDL goal <70   4. Sinus bradycardia   5. Bilateral carotid artery stenosis   6. Medication management      Plan:   In order of problems listed above:  1. CAD - He is s/p CABG in 2003 with inferior STEMI in 02/2020 with DES to RCA, NSTEMI in 05/2020 with DES to proximal-RCA, NSTEMI in 11/2020 with balloon angioplasty to mid-RCA and recent STEMI at [facility] last week with reported DESx3 to the RCA but records are not available to confirm this but have been requested.  - He has been doing well since returning home and denies any recurrent chest pain.  - There is some confusion regarding the medications he is taking at home and we reviewed he should be on ASA and Brilinta as Plavix was discontinued with initiation of Brilinta. Also on Coreg, Zetia and Praluent as he has been intolerant to multiple statins. Will recheck CBC and BMET today. He does have a small, firm hematoma along his right groin site on examination today and I outlined the area and told them to make [place] aware if this increases in size. Appears stable on examination  today with no evidence of pseudoaneurysm.   Addendum: 09/10/2021: Received notes from [facility].  Appears he presented with an inferior STEMI and underwent emergent cardiac catheterization with 3 overlapping DES to the RCA.  Was noted to have a widely patent LIMA-Diag-LAD and no SVG grafts were identified.  Echocardiogram during admission showed his EF was at 45 to 50% with grade 2 diastolic dysfunction and the inferior and inferior septal walls were hypokinetic. Hs Troponin peaked at [phone]. His creatinine was at 1.46 on the day of  hospital discharge on 09/03/2021.  2. HTN - His BP is well-controlled at 130/80 during today's visit. He remains on Coreg and Losartan. It is unclear if he is taking Amlodipine or Spironolactone at this time and I have asked them to call back to verify his medication regimen.   3. HLD - Will request most recent labs. He has been intolerant to statins and remains on Praluent and Zetia. LDL was at 10 in 2022.  4. Bradycardia - He was on low-dose Toprol-XL prior to admission and switched to Coreg. HR is in the 60's today and I encouraged him to track this at home as he may require dose reduction. No reported dizziness or presyncope.   5. Carotid Artery Stenosis - He is followed by Vascular Surgery and conservative management has been recommended given his history of laryngeal cancer and trach placement. He remains on Brilinta, ASA and Praluent.     Medication Adjustments/Labs and Tests Ordered: Current medicines are reviewed at length with the patient today.  Concerns regarding medicines are outlined above.  Medication changes, Labs and Tests ordered today are listed in the Patient Instructions below. Patient Instructions  Labwork: CBC BMET  Follow-Up: Keep scheduled follow up with Dr. [name] in March  Any Other Special Instructions Will Be Listed Below (If Applicable).     If you need a refill on your cardiac medications before your next appointment, please call your pharmacy.   Make sure you are taking BRILINTA and not Plavix (Clopidogrel).   Make sure you are taking Coreg (Carvedilol) and not Metoprolol (Lopressor).    Confirm if taking Spironolactone and Amlodipine.    Signed, Erma Heritage, PA-C  09/08/2021 4:49 PM    [address] [place] [address] [place], [place] [address] Phone: [phone] Fax: [phone]

## 2021-09-08 NOTE — Patient Instructions (Addendum)
Labwork: CBC BMET  Follow-Up: Keep scheduled follow up with Dr. [name] in March  Any Other Special Instructions Will Be Listed Below (If Applicable).     If you need a refill on your cardiac medications before your next appointment, please call your pharmacy.   Make sure you are taking BRILINTA and not Plavix (Clopidogrel).   Make sure you are taking Coreg (Carvedilol) and not Metoprolol (Lopressor).    Confirm if taking Spironolactone and Amlodipine.

## 2021-09-11 ENCOUNTER — Ambulatory Visit (HOSPITAL_COMMUNITY)
Admission: RE | Admit: 2021-09-11 | Discharge: 2021-09-11 | Disposition: A | Discharge status: Home / Self Care | Payer: Medicare HMO | Source: Ambulatory Visit | Attending: Student | Admitting: Student

## 2021-09-11 ENCOUNTER — Other Ambulatory Visit: Payer: Self-pay

## 2021-09-11 ENCOUNTER — Telehealth: Payer: Self-pay | Admitting: Student

## 2021-09-11 DIAGNOSIS — I729 Aneurysm of unspecified site: Secondary | ICD-10-CM | POA: Insufficient documentation

## 2021-09-11 DIAGNOSIS — T81718A Complication of other artery following a procedure, not elsewhere classified, initial encounter: Secondary | ICD-10-CM | POA: Insufficient documentation

## 2021-09-11 NOTE — Telephone Encounter (Signed)
° °  At the time of his visit, I outlined the site. Has it swollen beyond this? It is likely sore given his significant bruising at that time. If swollen beyond that area, he needs to have an arterial duplex to rule-out pseudoaneurysm along the groin site.   Thanks,  [place]

## 2021-09-11 NOTE — Telephone Encounter (Signed)
Spoke with wife who states that the area is swollen and tender to touch. Informed wife that we will obtain US. Wife agrees with plan of care.

## 2021-09-11 NOTE — Telephone Encounter (Signed)
Patient's wife called stating that he groin area is bothering for the last couple of days. The site they went up for the stents, he is having pain there.  She states [place] is aware of this, he was seen earlier this week.

## 2021-09-11 NOTE — Telephone Encounter (Signed)
Orders entered for US PELVIC DOPPLER LIMITED. Sent to scheduler.

## 2021-10-21 ENCOUNTER — Ambulatory Visit: Payer: Medicare HMO | Admitting: Cardiology

## 2021-11-11 DIAGNOSIS — J189 Pneumonia, unspecified organism: Secondary | ICD-10-CM

## 2021-11-11 HISTORY — DX: Pneumonia, unspecified organism: J18.9

## 2021-11-14 ENCOUNTER — Encounter (HOSPITAL_COMMUNITY): Payer: Self-pay

## 2021-11-14 ENCOUNTER — Other Ambulatory Visit: Payer: Self-pay

## 2021-11-14 ENCOUNTER — Emergency Department (HOSPITAL_COMMUNITY): Payer: Medicare HMO

## 2021-11-14 ENCOUNTER — Emergency Department (HOSPITAL_COMMUNITY)
Admission: EM | Admit: 2021-11-14 | Discharge: 2021-11-15 | Disposition: A | Discharge status: Home / Self Care | Payer: Medicare HMO | Attending: Emergency Medicine | Admitting: Emergency Medicine

## 2021-11-14 DIAGNOSIS — I2510 Atherosclerotic heart disease of native coronary artery without angina pectoris: Secondary | ICD-10-CM | POA: Diagnosis not present

## 2021-11-14 DIAGNOSIS — R300 Dysuria: Secondary | ICD-10-CM | POA: Insufficient documentation

## 2021-11-14 DIAGNOSIS — R509 Fever, unspecified: Secondary | ICD-10-CM | POA: Diagnosis not present

## 2021-11-14 DIAGNOSIS — R531 Weakness: Secondary | ICD-10-CM

## 2021-11-14 DIAGNOSIS — R059 Cough, unspecified: Secondary | ICD-10-CM | POA: Insufficient documentation

## 2021-11-14 DIAGNOSIS — E876 Hypokalemia: Secondary | ICD-10-CM | POA: Insufficient documentation

## 2021-11-14 DIAGNOSIS — Z7982 Long term (current) use of aspirin: Secondary | ICD-10-CM | POA: Insufficient documentation

## 2021-11-14 DIAGNOSIS — R262 Difficulty in walking, not elsewhere classified: Secondary | ICD-10-CM | POA: Insufficient documentation

## 2021-11-14 DIAGNOSIS — Z20822 Contact with and (suspected) exposure to covid-19: Secondary | ICD-10-CM | POA: Diagnosis not present

## 2021-11-14 LAB — COMPREHENSIVE METABOLIC PANEL
ALT: 27 U/L (ref 0–44)
AST: 29 U/L (ref 15–41)
Albumin: 3.3 g/dL — ABNORMAL LOW (ref 3.5–5.0)
Alkaline Phosphatase: 103 U/L (ref 38–126)
Anion gap: 6 (ref 5–15)
BUN: 19 mg/dL (ref 8–23)
CO2: 24 mmol/L (ref 22–32)
Calcium: 8.3 mg/dL — ABNORMAL LOW (ref 8.9–10.3)
Chloride: 107 mmol/L (ref 98–111)
Creatinine, Ser: 1.42 mg/dL — ABNORMAL HIGH (ref 0.61–1.24)
GFR, Estimated: 49 mL/min — ABNORMAL LOW (ref >60)
Glucose, Bld: 107 mg/dL — ABNORMAL HIGH (ref 70–99)
Potassium: 3.4 mmol/L — ABNORMAL LOW (ref 3.5–5.1)
Sodium: 137 mmol/L (ref 135–145)
Total Bilirubin: 0.4 mg/dL (ref 0.3–1.2)
Total Protein: 6.8 g/dL (ref 6.5–8.1)

## 2021-11-14 LAB — TROPONIN I (HIGH SENSITIVITY)
Troponin I (High Sensitivity): 28 ng/L — ABNORMAL HIGH (ref <18)
Troponin I (High Sensitivity): 28 ng/L — ABNORMAL HIGH (ref <18)

## 2021-11-14 LAB — URINALYSIS, ROUTINE W REFLEX MICROSCOPIC
Bilirubin Urine: NEGATIVE
Glucose, UA: NEGATIVE mg/dL
Hgb urine dipstick: NEGATIVE
Ketones, ur: NEGATIVE mg/dL
Leukocytes,Ua: NEGATIVE
Nitrite: NEGATIVE
Protein, ur: NEGATIVE mg/dL
Specific Gravity, Urine: 1.005 (ref 1.005–1.030)
pH: 6 (ref 5.0–8.0)

## 2021-11-14 LAB — CBC WITH DIFFERENTIAL/PLATELET
Abs Immature Granulocytes: 0.04 10*3/uL (ref 0.00–0.07)
Basophils Absolute: 0 10*3/uL (ref 0.0–0.1)
Basophils Relative: 0 %
Eosinophils Absolute: 0 10*3/uL (ref 0.0–0.5)
Eosinophils Relative: 1 %
HCT: 39.2 % (ref 39.0–52.0)
Hemoglobin: 12.5 g/dL — ABNORMAL LOW (ref 13.0–17.0)
Immature Granulocytes: 1 %
Lymphocytes Relative: 22 %
Lymphs Abs: 1.3 10*3/uL (ref 0.7–4.0)
MCH: 28.7 pg (ref 26.0–34.0)
MCHC: 31.9 g/dL (ref 30.0–36.0)
MCV: 89.9 fL (ref 80.0–100.0)
Monocytes Absolute: 0.5 10*3/uL (ref 0.1–1.0)
Monocytes Relative: 9 %
Neutro Abs: 3.9 10*3/uL (ref 1.7–7.7)
Neutrophils Relative %: 67 %
Platelets: 187 10*3/uL (ref 150–400)
RBC: 4.36 MIL/uL (ref 4.22–5.81)
RDW: 16.4 % — ABNORMAL HIGH (ref 11.5–15.5)
WBC: 5.8 10*3/uL (ref 4.0–10.5)
nRBC: 0 % (ref 0.0–0.2)

## 2021-11-14 LAB — BLOOD GAS, VENOUS
Acid-base deficit: 8.5 mmol/L — ABNORMAL HIGH (ref 0.0–2.0)
Bicarbonate: 14.8 mmol/L — ABNORMAL LOW (ref 20.0–28.0)
Drawn by: 1517
FIO2: 21 %
O2 Saturation: 72.1 %
Patient temperature: 36.9
pCO2, Ven: 25 mmHg — ABNORMAL LOW (ref 44–60)
pH, Ven: 7.38 (ref 7.25–7.43)
pO2, Ven: 38 mmHg (ref 32–45)

## 2021-11-14 LAB — LACTIC ACID, PLASMA: Lactic Acid, Venous: 1.2 mmol/L (ref 0.5–1.9)

## 2021-11-14 LAB — RESP PANEL BY RT-PCR (FLU A&B, COVID) ARPGX2
Influenza A by PCR: NEGATIVE
Influenza B by PCR: NEGATIVE
SARS Coronavirus 2 by RT PCR: NEGATIVE

## 2021-11-14 MED ORDER — SODIUM CHLORIDE 0.9 % IV BOLUS
1000.0000 mL | Freq: Once | INTRAVENOUS | Status: AC
  Administered 2021-11-14: 1000 mL via INTRAVENOUS

## 2021-11-14 NOTE — ED Triage Notes (Signed)
Pt arrived from home via [facility] w c/o  weakness status post PNA dx on Tuesday per pt. Pt states that today he feels generalized weakness. He has been unable to walk around the house per baseline and has had trouble staying awake. As well as has general body aches.  ?

## 2021-11-14 NOTE — Discharge Instructions (Signed)
You were seen in the emergency department for increased weakness fatigue in the setting of a recent pneumonia.  Your chest x-ray showed possible pneumonia which is possibly left over from what you were treated for last week.  Your lab work was unremarkable.  You felt better after some IV fluids.  Please continue your medications and follow-up with your primary care doctor.  Return to the emergency department if any worsening or concerning symptoms ?

## 2021-11-14 NOTE — ED Provider Notes (Signed)
?[facility] ?Provider Note ? ? ?CSN: [id number] ?Arrival date & time: 11/14/21  1919 ? ?  ? ?History ? ?Chief Complaint  ?Patient presents with  ? Weakness  ? ? ?Kenneth [name] is a 81 y.o. male.  He is brought in by EMS from home for general weakness.  He has been fatigued and unable to walk around the house like his baseline.  Increased sleeping.  Was seen at [facility] few days ago and was diagnosed with pneumonia treated with Levaquin.  Also started on a new cardiac medication by Dr. Gerarda Fraction recently.  Patient has a cough and some shortness of breath.  Minimal small-volume hemoptysis.  Has a tracheostomy.  Also endorses some loose stools and some dysuria.  Had a low-grade fever at home today.  Got some fluids by EMS and feels a little bit better.  ? ?The history is provided by the patient and a relative.  ?Weakness ?Severity:  Moderate ?Onset quality:  Gradual ?Duration:  5 days ?Timing:  Constant ?Progression:  Worsening ?Chronicity:  New ?Context: recent infection   ?Relieved by:  Nothing ?Worsened by:  Activity ?Ineffective treatments:  Rest ?Associated symptoms: cough, difficulty walking, dysuria and fever   ?Associated symptoms: no abdominal pain, no chest pain, no headaches, no nausea, no shortness of breath and no vomiting   ?Risk factors: coronary artery disease, heart disease and new medications   ? ?  ? ?Home Medications ?Prior to Admission medications   ?Medication Sig Start Date End Date Taking? Authorizing Provider  ?acetaminophen (TYLENOL) 500 MG tablet Take 500 mg by mouth every 6 (six) hours as needed.    [provider]  ?Alirocumab (PRALUENT) 75 MG/ML SOAJ Inject 1 pen into the skin every 14 (fourteen) days. 07/04/20   Arnoldo Lenis, MD  ?amLODipine (NORVASC) 10 MG tablet Take 1 tablet by mouth once daily 06/29/21   Arnoldo Lenis, MD  ?aspirin EC 81 MG tablet Take 81 mg by mouth daily. Swallow whole.    [provider]  ?BRILINTA 90 MG TABS tablet  SMARTSIG:1 Tablet(s) By Mouth Every 12 Hours 09/03/21   [provider]  ?carvedilol (COREG) 6.25 MG tablet Take 6.25 mg by mouth 2 (two) times daily. 09/03/21   [provider]  ?ezetimibe (ZETIA) 10 MG tablet Take 1 tablet by mouth once daily ?Patient not taking: Reported on 09/08/2021 07/30/21   Arnoldo Lenis, MD  ?losartan (COZAAR) 25 MG tablet Take 25 mg by mouth daily. 09/03/21   [provider]  ?nitroGLYCERIN (NITROSTAT) 0.4 MG SL tablet Place 1 tablet (0.4 mg total) under the tongue every 5 (five) minutes as needed for chest pain. 01/31/17 09/08/21  Lendon Colonel, NP  ?pantoprazole (PROTONIX) 40 MG tablet Take 1 tablet by mouth once daily 07/28/21   Arnoldo Lenis, MD  ?PARoxetine (PAXIL) 20 MG tablet Take 20 mg by mouth at bedtime. ?Patient not taking: Reported on 09/08/2021    [provider]  ?spironolactone (ALDACTONE) 25 MG tablet Take 25 mg by mouth daily. ?Patient not taking: Reported on 09/08/2021 03/16/21   [provider]  ?   ? ?Allergies    ?Nifedipine, Lorazepam, Other, Penicillins, Sulfacetamide, Sulfonamide derivatives, Erythromycin, Statins, and Vancomycin   ? ?Review of Systems   ?Review of Systems  ?Constitutional:  Positive for fever.  ?HENT:  Negative for trouble swallowing.   ?Eyes:  Negative for visual disturbance.  ?Respiratory:  Positive for cough. Negative for shortness of breath.   ?  Cardiovascular:  Negative for chest pain.  ?Gastrointestinal:  Negative for abdominal pain, nausea and vomiting.  ?Genitourinary:  Positive for dysuria.  ?Musculoskeletal:  Negative for neck pain.  ?Neurological:  Positive for weakness. Negative for headaches.  ? ?Physical Exam ?Updated Vital Signs ?BP 124/67   Pulse (!) 52   Temp 98.7 ?F (37.1 ?C) (Oral)   Resp 19   SpO2 97%  ?Physical Exam ?Vitals and nursing note reviewed.  ?Constitutional:   ?   General: He is not in acute distress. ?   Appearance: Normal appearance. He is well-developed.  ?HENT:  ?    Head: Normocephalic and atraumatic.  ?Eyes:  ?   Conjunctiva/sclera: Conjunctivae normal.  ?Neck:  ?   Comments: Tracheostomy in place ?Cardiovascular:  ?   Rate and Rhythm: Normal rate and regular rhythm.  ?   Heart sounds: No murmur heard. ?Pulmonary:  ?   Effort: Pulmonary effort is normal. No respiratory distress.  ?   Breath sounds: Normal breath sounds.  ?Abdominal:  ?   Palpations: Abdomen is soft.  ?   Tenderness: There is no abdominal tenderness. There is no guarding or rebound.  ?Musculoskeletal:     ?   General: No swelling. Normal range of motion.  ?   Cervical back: Neck supple.  ?   Right lower leg: No edema.  ?   Left lower leg: No edema.  ?Skin: ?   General: Skin is warm and dry.  ?   Capillary Refill: Capillary refill takes less than 2 seconds.  ?Neurological:  ?   General: No focal deficit present.  ?   Mental Status: He is alert.  ?   Sensory: No sensory deficit.  ?   Motor: No weakness.  ? ? ?ED Results / Procedures / Treatments   ?Labs ?(all labs ordered are listed, but only abnormal results are displayed) ?Labs Reviewed  ?COMPREHENSIVE METABOLIC PANEL - Abnormal; Notable for the following components:  ?    Result Value  ? Potassium 3.4 (*)   ? Glucose, Bld 107 (*)   ? Creatinine, Ser 1.42 (*)   ? Calcium 8.3 (*)   ? Albumin 3.3 (*)   ? GFR, Estimated 49 (*)   ? All other components within normal limits  ?CBC WITH DIFFERENTIAL/PLATELET - Abnormal; Notable for the following components:  ? Hemoglobin 12.5 (*)   ? RDW 16.4 (*)   ? All other components within normal limits  ?BLOOD GAS, VENOUS - Abnormal; Notable for the following components:  ? pCO2, Ven 25 (*)   ? Bicarbonate 14.8 (*)   ? Acid-base deficit 8.5 (*)   ? All other components within normal limits  ?URINALYSIS, ROUTINE W REFLEX MICROSCOPIC - Abnormal; Notable for the following components:  ? Color, Urine STRAW (*)   ? All other components within normal limits  ?TROPONIN I (HIGH SENSITIVITY) - Abnormal; Notable for the following  components:  ? Troponin I (High Sensitivity) 28 (*)   ? All other components within normal limits  ?TROPONIN I (HIGH SENSITIVITY) - Abnormal; Notable for the following components:  ? Troponin I (High Sensitivity) 28 (*)   ? All other components within normal limits  ?CULTURE, BLOOD (ROUTINE X 2)  ?CULTURE, BLOOD (ROUTINE X 2)  ?RESP PANEL BY RT-PCR (FLU A&B, COVID) [id number]  ?URINE CULTURE  ?LACTIC ACID, PLASMA  ? ? ?EKG ?EKG Interpretation ? ?Date/Time:  Saturday November 14 2021 21:06:01 EDT ?Ventricular Rate:  55 ?PR Interval:  134 ?QRS Duration:  134 ?QT Interval:  455 ?QTC Calculation: 436 ?R Axis:   4 ?Text Interpretation: Sinus rhythm Atrial premature complex Right bundle branch block Inferior infarct, age indeterminate Lateral leads are also involved No significant change since prior 1/23 Confirmed by Aletta Edouard [phone]) on 11/14/2021 9:20:22 PM ? ?Radiology ?DG Chest Port 1 View ? ?Result Date: 11/14/2021 ?CLINICAL DATA:  Generalized weakness. Recent diagnosis of pneumonia. EXAM: PORTABLE CHEST 1 VIEW COMPARISON:  July 27, 2021 FINDINGS: Stable postsurgical changes. Tracheostomy tube in stable position. Stable enlarged cardiac silhouette. Low lung volumes.  Streaky interstitial opacities bilaterally. IMPRESSION: 1. Low lung volumes. 2. Streaky interstitial opacities bilaterally, which may represent atelectasis or atypical pneumonia. Electronically Signed   By: Fidela Salisbury M.D.   On: 11/14/2021 20:59   ? ?Procedures ?Procedures  ? ? ?Medications Ordered in ED ?Medications  ?sodium chloride 0.9 % bolus 1,000 mL (has no administration in time range)  ? ? ?ED Course/ Medical Decision Making/ A&P ?Clinical Course as of 11/15/21 1022  ?Sat Nov 14, 2021  ?2102 Chest x-ray interpreted by me as atelectasis versus infiltrate bilateral [MB]  ?  ?Clinical Course User Index ?[MB] Hayden Rasmussen, MD  ? ?                        ?Medical Decision Making ?Amount and/or Complexity of Data Reviewed ?Labs:  ordered. ?Radiology: ordered. ? ?This patient complains of generalized weakness in the setting of recent diagnosis of pneumonia; this involves an extensive number of treatment ?Options and is a complaint that carries with it a

## 2021-11-16 LAB — URINE CULTURE: Culture: NO GROWTH

## 2021-11-17 ENCOUNTER — Ambulatory Visit (INDEPENDENT_AMBULATORY_CARE_PROVIDER_SITE_OTHER): Payer: Medicare HMO | Admitting: Student

## 2021-11-17 ENCOUNTER — Encounter: Payer: Self-pay | Admitting: Student

## 2021-11-17 VITALS — BP 126/76 | HR 60 | Ht 66.0 in | Wt 179.0 lb

## 2021-11-17 DIAGNOSIS — I255 Ischemic cardiomyopathy: Secondary | ICD-10-CM | POA: Diagnosis not present

## 2021-11-17 DIAGNOSIS — I2510 Atherosclerotic heart disease of native coronary artery without angina pectoris: Secondary | ICD-10-CM | POA: Diagnosis not present

## 2021-11-17 DIAGNOSIS — I10 Essential (primary) hypertension: Secondary | ICD-10-CM | POA: Diagnosis not present

## 2021-11-17 DIAGNOSIS — I6523 Occlusion and stenosis of bilateral carotid arteries: Secondary | ICD-10-CM

## 2021-11-17 DIAGNOSIS — E785 Hyperlipidemia, unspecified: Secondary | ICD-10-CM | POA: Diagnosis not present

## 2021-11-17 NOTE — Patient Instructions (Signed)
Medication Instructions:  Your physician recommends that you continue on your current medications as directed. Please refer to the Current Medication list given to you today.  *If you need a refill on your cardiac medications before your next appointment, please call your pharmacy*   Lab Work: NONE   If you have labs (blood work) drawn today and your tests are completely normal, you will receive your results only by: MyChart Message (if you have MyChart) OR A paper copy in the mail If you have any lab test that is abnormal or we need to change your treatment, we will call you to review the results.   Testing/Procedures: NONE    Follow-Up: At [facility], you and your health needs are our priority.  As part of our continuing mission to provide you with exceptional heart care, we have created designated Provider Care Teams.  These Care Teams include your primary Cardiologist (physician) and Advanced Practice Providers (APPs -  Physician Assistants and Nurse Practitioners) who all work together to provide you with the care you need, when you need it.  We recommend signing up for the patient portal called "MyChart".  Sign up information is provided on this After Visit Summary.  MyChart is used to connect with patients for Virtual Visits (Telemedicine).  Patients are able to view lab/test results, encounter notes, upcoming appointments, etc.  Non-urgent messages can be sent to your provider as well.   To learn more about what you can do with MyChart, go to [email or web address].    Your next appointment:   6 month(s)  The format for your next appointment:   In Person  Provider:   Jonathan Branch, MD    Other Instructions Thank you for choosing  [facility]!    Important Information About Sugar       

## 2021-11-17 NOTE — Progress Notes (Signed)
? ?Cardiology Office Note   ? ?Date:  11/17/2021  ? ?ID:  Kenneth [name], DOB 20-Mar-1941, MRN [id number] ? ?PCP:  [name], MD  ?Cardiologist: Carlyle Dolly, MD   ? ?Chief Complaint  ?Patient presents with  ? Follow-up  ?  2 month visit  ? ? ?History of Present Illness:   ? ?Kenneth [name] is a 81 y.o. male with past medical history of CAD (s/p CABG in 2003, patent grafts by cath in 2007, s/p inferior STEMI in 02/2020 with DES to RCA, s/p NSTEMI in 05/2020 with DES to proximal-RCA, s/p NSTEMI in 11/2020 with balloon angioplasty to mid-RCA and STEMI in 08/2021 with DESx3 to RCA), baseline bradycardia, HTN, HLD, carotid artery stenosis (s/p stenting of right carotid in 2013), laryngeal carcinoma (s/p tracheostomy placement in 02/2018) and prior CVA who presents to the office today for 96-monthfollow-up. ? ?He was last examined by myself in 08/2021 and had [facility]  He reported overall doing well and denied any recurrent chest pain. Records were requested and showed that he had received 3 overlapping DES to the RCA and catheterization showed a widely patent LIMA to diagonal to LAD and no SVG grafts were identified. Echocardiogram showed his EF was at 45 to 50% with grade 2 diastolic dysfunction. He did have swelling along his right catheterization site and an ultrasound was obtained with no evidence of pseudoaneurysm. ? ?[facility] evaluation of progressive weakness after having recent been diagnosed with pneumonia. Hs troponin values were obtained and flat at 28.  CXR showed streaky interstitial opacities which may represent atelectasis or pneumonia. He was given IV fluids and discharged home. ? ?In talking with the patient, his wife and his son today, he reports he was initially doing well prior to his recent pneumonia diagnosis. He enjoyed golfing 2 weeks ago. Since having his pneumonia, he reports overall  feeling weak and reports he has a diminished PO intake. Also reports he did not feel well while on antibiotics. His wife has been encouraging him to consume more fluids and they feel like he is starting to improve. He is still having an intermittent cough and they are suctioning his trach more frequently. He is scheduled to see Pulmonology next week. No specific orthopnea, PND or pitting edema. He reports good compliance with Brilinta and denies any recent melena, hematochezia or hematuria.  ? ?Past Medical History:  ?Diagnosis Date  ? Acute ST elevation myocardial infarction (STEMI) of inferior wall ([place] 03/01/2020  ? [facility] ? Anxiety   ? Arthritis   ? Back (05/04/2018)  ? Carotid artery disease ([place]   ? a. h/o R carotid stent.  ? Chronic lower back pain   ? "have it at night" (05/04/2018)  ? CKD (chronic kidney disease), stage III ([place]   ? Coronary artery disease   ? s/p CABG in 2003, patent grafts by cath in 2007, s/p inferior STEMI in 02/2020 with DES to RCA, s/p NSTEMI in 05/2020 with DES to proximal-RCA, s/p NSTEMI in 11/2020 with balloon angioplasty to mid-RCA and STEMI in 08/2021 with DESx3 to RCA  ? CVA (cerebral vascular accident) ([place] 10/2017  ? "little numb on my left face since" (05/04/2018)  ? Depression   ? GERD (gastroesophageal reflux disease)   ? High triglycerides   ? History of kidney stones   ? Hypertension   ?  Laryngeal carcinoma ([place]) 1998  ? Peripheral vascular disease ([place])   ? Pneumonia 11/11/2021  ? Sinus bradycardia   ? Stroke [facility])   ? "he's had several little strokes; many that he wasn't aware of" (05/04/2018)  ? Tobacco abuse   ? Tracheal stenosis   ? ? ?Past Surgical History:  ?Procedure Laterality Date  ? CAROTID STENT Right 06-13-12; 05/04/2018  ? CAROTID STENT INSERTION N/A 06/13/2012  ? Procedure: CAROTID STENT INSERTION;  Surgeon: Serafina Mitchell, MD;  [facility] CATH LAB;  Service: Cardiovascular;  Laterality: N/A;  ? CATARACT EXTRACTION, BILATERAL  Bilateral   ? COLONOSCOPY  11/10/2011  ? Dr. [name]: hemorrhoids, tubular adenoma  ? CORNEAL TRANSPLANT Bilateral   ? CORONARY ARTERY BYPASS GRAFT  ~ 2003  ? "CABG X3"  ? CORONARY BALLOON ANGIOPLASTY N/A 11/26/2020  ? Procedure: CORONARY BALLOON ANGIOPLASTY;  Surgeon: Troy Sine, MD;  Location: [place] CV LAB;  Service: Cardiovascular;  Laterality: N/A;  ? CORONARY STENT INTERVENTION N/A 06/05/2020  ? Procedure: CORONARY STENT INTERVENTION;  Surgeon: Lorretta Harp, MD;  Location: [place] CV LAB;  Service: Cardiovascular;  Laterality: N/A;  ? EYE SURGERY    ? INSERTION OF RETROGRADE CAROTID STENT Right 05/04/2018  ? Procedure: INSERTION OF RIGHT  CAROTID STENT using an ABBOT- XACT carotid stent system;  Surgeon: Serafina Mitchell, MD;  Location: [place];  Service: Vascular;  Laterality: Right;  ? LAPAROSCOPIC CHOLECYSTECTOMY  2007  ? LEFT HEART CATH AND CORS/GRAFTS ANGIOGRAPHY N/A 10/07/2016  ? Procedure: Left Heart Cath and Cors/Grafts Angiography;  Surgeon: Troy Sine, MD;  Location: [place] CV LAB;  Service: Cardiovascular;  Laterality: N/A;  ? LEFT HEART CATH AND CORS/GRAFTS ANGIOGRAPHY N/A 06/05/2020  ? Procedure: LEFT HEART CATH AND CORS/GRAFTS ANGIOGRAPHY;  Surgeon: Lorretta Harp, MD;  Location: [place] CV LAB;  Service: Cardiovascular;  Laterality: N/A;  ? LEFT HEART CATH AND CORS/GRAFTS ANGIOGRAPHY N/A 11/26/2020  ? Procedure: LEFT HEART CATH AND CORS/GRAFTS ANGIOGRAPHY;  Surgeon: Troy Sine, MD;  Location: [place] CV LAB;  Service: Cardiovascular;  Laterality: N/A;  ? PARTIAL LARYNGECTOMY  1998  ? TRACHEOSTOMY  03/13/2018  ? "@ [facility]"  ? ? ?Current Medications: ?Outpatient Medications Prior to Visit  ?Medication Sig Dispense Refill  ? aspirin EC 81 MG tablet Take 81 mg by mouth daily. Swallow whole.    ? BRILINTA 90 MG TABS tablet SMARTSIG:1 Tablet(s) By Mouth Every 12 Hours    ? carvedilol (COREG) 6.25 MG tablet Take 6.25 mg by mouth 2 (two) times daily.    ? ezetimibe  (ZETIA) 10 MG tablet Take 1 tablet by mouth once daily 90 tablet 1  ? losartan (COZAAR) 25 MG tablet Take 25 mg by mouth daily.    ? nitroGLYCERIN (NITROSTAT) 0.4 MG SL tablet Place 1 tablet (0.4 mg total) under the tongue every 5 (five) minutes as needed for chest pain. 25 tablet 3  ? pantoprazole (PROTONIX) 40 MG tablet Take 1 tablet by mouth once daily 90 tablet 2  ? PARoxetine (PAXIL) 20 MG tablet Take 20 mg by mouth at bedtime.    ? acetaminophen (TYLENOL) 500 MG tablet Take 500 mg by mouth every 6 (six) hours as needed.    ? Alirocumab (PRALUENT) 75 MG/ML SOAJ Inject 1 pen into the skin every 14 (fourteen) days. 2 mL 11  ? amLODipine (NORVASC) 10 MG tablet Take 1 tablet by mouth once daily 90 tablet 3  ? spironolactone (ALDACTONE) 25 MG  tablet Take 25 mg by mouth daily. (Patient not taking: Reported on 09/08/2021)    ? ?No facility-administered medications prior to visit.  ?  ? ?Allergies:   Nifedipine, Lorazepam, Other, Penicillins, Sulfacetamide, Sulfonamide derivatives, Erythromycin, Statins, and Vancomycin  ? ?Social History  ? ?Socioeconomic History  ? Marital status: Married  ?  Spouse name: Not on file  ? Number of children: Not on file  ? Years of education: Not on file  ? Highest education level: Not on file  ?Occupational History  ? Occupation: retired  ?  Comment: [employer]  ?Tobacco Use  ? Smoking status: Former  ?  Packs/day: 1.00  ?  Years: 15.00  ?  Pack years: 15.00  ?  Types: Cigarettes  ?  Start date: 04/22/1953  ?  Quit date: 07/26/1970  ?  Years since quitting: 51.3  ? Smokeless tobacco: Former  ?  Types: Chew  ?  Quit date: 05/05/2002  ?Vaping Use  ? Vaping Use: Never used  ?Substance and Sexual Activity  ? Alcohol use: Never  ?  Alcohol/week: 0.0 standard drinks  ? Drug use: Never  ? Sexual activity: Not Currently  ?Other Topics Concern  ? Not on file  ?Social History Narrative  ? Walks on the treadmill every other day at the [facility] for the last couple of weeks.  ?   ?   ? ?Social  Determinants of Health  ? ?Financial Resource Strain: Not on file  ?Food Insecurity: Not on file  ?Transportation Needs: Not on file  ?Physical Activity: Not on file  ?Stress: Not on file  ?Social Connections:

## 2021-11-19 LAB — CULTURE, BLOOD (ROUTINE X 2)
Culture: NO GROWTH
Culture: NO GROWTH
Special Requests: ADEQUATE

## 2021-11-24 ENCOUNTER — Encounter (HOSPITAL_COMMUNITY): Payer: Medicare HMO

## 2021-11-24 ENCOUNTER — Ambulatory Visit: Payer: Medicare HMO | Admitting: Vascular Surgery

## 2022-02-10 DIAGNOSIS — I469 Cardiac arrest, cause unspecified: Secondary | ICD-10-CM | POA: Insufficient documentation

## 2022-02-14 ENCOUNTER — Other Ambulatory Visit: Payer: Self-pay | Admitting: Cardiology

## 2022-03-25 ENCOUNTER — Other Ambulatory Visit: Payer: Self-pay | Admitting: Cardiology

## 2022-03-28 ENCOUNTER — Other Ambulatory Visit: Payer: Self-pay | Admitting: Cardiology

## 2022-05-23 IMAGING — DX DG CHEST 1V PORT
1 series · 1 of 1 positions shown · non-contrast
Comparison: June 04, 2020

CLINICAL DATA: Chest pain.

EXAM:
PORTABLE CHEST 1 VIEW

[chest ap]
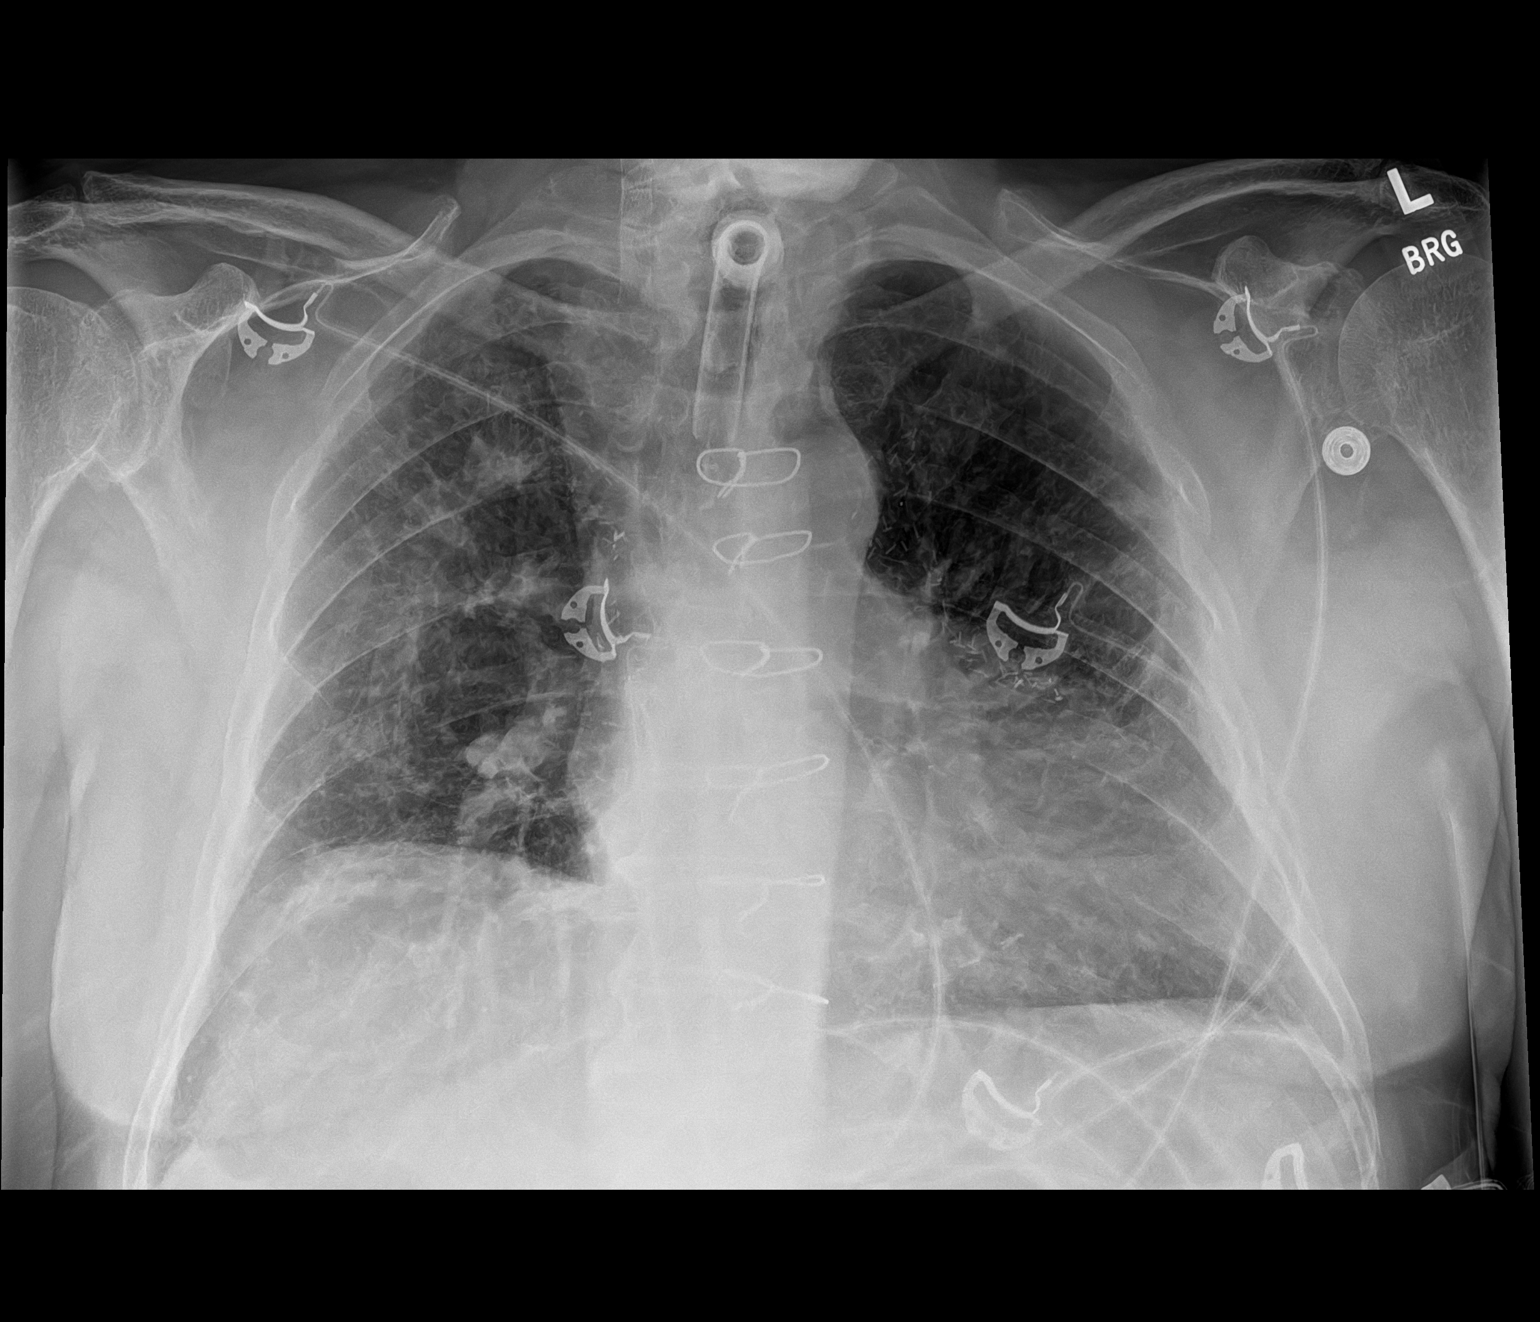

[1 of 1 positions shown; findings below may reference images not displayed]

FINDINGS: Tracheostomy catheter tip is 5.0 cm above the carina. Areas of
scarring noted in each mid lung and right lower lung region. No
edema or airspace opacity. Heart is upper normal in size with
pulmonary vascularity normal. Patient is status post internal
mammary bypass grafting. No adenopathy. No bone lesions. There is a
stent in the right carotid artery region.
IMPRESSION: Tracheostomy as described without pneumothorax. Scarring
bilaterally, stable, without edema or airspace opacity. Stable
cardiac silhouette. Postoperative changes noted.

## 2022-05-27 ENCOUNTER — Encounter: Payer: Self-pay | Admitting: Cardiology

## 2022-05-27 ENCOUNTER — Ambulatory Visit: Payer: Medicare HMO | Attending: Cardiology | Admitting: Cardiology

## 2022-05-27 ENCOUNTER — Encounter: Payer: Self-pay | Admitting: Internal Medicine

## 2022-05-27 VITALS — BP 144/78 | HR 54 | Ht 65.0 in | Wt 182.0 lb

## 2022-05-27 DIAGNOSIS — I2510 Atherosclerotic heart disease of native coronary artery without angina pectoris: Secondary | ICD-10-CM

## 2022-05-27 DIAGNOSIS — I10 Essential (primary) hypertension: Secondary | ICD-10-CM

## 2022-05-27 DIAGNOSIS — E782 Mixed hyperlipidemia: Secondary | ICD-10-CM | POA: Diagnosis not present

## 2022-05-27 NOTE — Progress Notes (Signed)
Clinical Summary Mr. Hnat is a 81 y.o.male seen today for follow up of the following medical problems.    1. CAD   - prior CABG in 2003, repeat cath 2007 with patent grafts.   - 07/2011 MPI low risk study, no ischemia.   - echo 04/2012 LVEF 65-60%, grade II diastolic dysfunction  - 07/6107 echo showed LVEF 65-70%, grade II diastolic dysfunction.  - 11/2013 MPI no ischemia.  - 10/2017 echo LVEF 60-65%, no WMAs, normal diastolic function     - 12/452 nuclear stress: no ischemia  - 02/2020 inferior STEMI, received DES to RCA 05/2020 NSTEMI, DES to RCA   11/2020 NSTEMI, peak trop 162 11/2020 with RCA ISR, had PTCA. Recs for indefinite DAPT  (on high dose ASA per neuro given prior CVA, carotid disease) 11/2020 echo: LVEF 65-70%, no WMAs, normal RV,   08/2021 STEMI, 3 DES to the RCA in [place].  - Echocardiogram during recent admission showed his EF was mildly reduced at 45 to 50      - no chest pains. Chronic stable SOB - compliant with meds   2. Hyperlipidemia   - reported history of rash on statin, he reports tried multiple and all caused rash  - he is on zetia, praluent  -reprorts recnet labs with pcp    3. HTN   - he is compliant with meds - h0ome bp's 110s-120/60s   4. Carotid stenosis   - prior carotid stenting   - followed by vascular, recs have been for DAPT   - 0/9811 carotid US: LICA 91-47%, IRCA 8-29% with patent right CCA stent - recent vascualr appt 01/2021, plans for medical therapy due to risk for intervention and patient preference.      5. SOB - remote 10-15 year history of smoking. Used to work in [place], mainly with walking up incline.    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   - followed by pulm Notes mention CT imaging consistent with slowly resolving ALI, lung imaging has  shown consistent interstitial findings.        6. Throat cancer  s/p radiation and surgery - followed at [facility]   - has tracheostomy   7. CVA - admit 10/2016 with small cortical based infarcts in right parietal lobe, associated hemorrhage - right ICA 50-69% 10/2017   - admit 11/2020 with TIA - was changed to high dose ASA at that time Past Medical History:  Diagnosis Date   Acute ST elevation myocardial infarction (STEMI) of inferior wall ([place]) 03/01/2020   [facility]   Anxiety    Arthritis    Back (05/04/2018)   Carotid artery disease ([place])    a. h/o R carotid stent.   Chronic lower back pain    "have it at night" (05/04/2018)   CKD (chronic kidney disease), stage III ([place])    Coronary artery disease    s/p CABG in 2003, patent grafts by cath in 2007, s/p inferior STEMI in 02/2020 with DES to RCA, s/p NSTEMI in 05/2020 with DES to proximal-RCA, s/p NSTEMI in 11/2020 with balloon angioplasty to mid-RCA and STEMI in 08/2021 with DESx3 to RCA   CVA (cerebral vascular accident) ([place]) 10/2017   "little numb on my left face since" (05/04/2018)   Depression    GERD (gastroesophageal reflux  disease)    High triglycerides    History of kidney stones    Hypertension    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 "Turns into maniac" REACTION: Alters mental status. "Turns into maniac" REACTION: Alters mental status. "Turns into maniac"   Other     REACTION: Unknown to patient. States that MD states allergy per medical records   Penicillins Hives    Has patient had a PCN reaction causing immediate rash, facial/tongue/throat  swelling, SOB or lightheadedness with hypotension: Yes Has patient had a PCN reaction causing severe rash involving mucus membranes or skin necrosis: No Has patient had a PCN reaction that required hospitalization No Has patient had a PCN reaction occurring within the last 10 years: No If all of the above answers are "NO", then may proceed with Cephalosporin use.  HAS TOLERATED: cephalexin   Sulfacetamide     REACTION: Unknown to patient. States that MD states allergy per medical records   Sulfonamide Derivatives Other (See Comments)    REACTION: Unknown to patient. States that MD states allergy per medical records   Erythromycin Rash   Statins Itching and Rash    Rash on his trunk and arms, swelling in his lips   Vancomycin Itching     Current Outpatient Medications  Medication Sig Dispense Refill   acetaminophen (TYLENOL) 500 MG tablet Take 500 mg by mouth every 6 (six) hours as needed.     aspirin EC 81 MG tablet Take 81 mg by mouth daily. Swallow whole.     BRILINTA 90 MG TABS tablet SMARTSIG:1 Tablet(s) By Mouth Every 12 Hours     carvedilol (COREG) 6.25 MG tablet Take 6.25 mg by mouth 2 (two) times daily.     ezetimibe (ZETIA) 10 MG tablet Take 1 tablet by mouth once daily 90 tablet 1   nitroGLYCERIN (NITROSTAT) 0.4 MG SL tablet Place 1 tablet (0.4 mg total) under the tongue every 5 (five) minutes as needed for chest pain. 25 tablet 3   pantoprazole (PROTONIX) 40 MG tablet Take 1 tablet by mouth once daily 90 tablet 2   PARoxetine (PAXIL) 20 MG tablet Take 20 mg by mouth at bedtime.     PRALUENT 75 MG/ML SOAJ INJECT 1 PEN SUBCUTANEOUSLY EVERY TWO WEEKS 2 mL 6   amLODipine (NORVASC) 10 MG tablet Take 1 tablet by mouth once daily (Patient not taking: Reported on 05/27/2022) 90 tablet 3   losartan (COZAAR) 25 MG tablet Take 25 mg by mouth daily. (Patient not taking: Reported on 05/27/2022)     spironolactone (ALDACTONE) 25 MG tablet Take 25 mg by mouth daily. (Patient not taking:  Reported on 09/08/2021)     No current facility-administered medications for this visit.     Past Surgical History:  Procedure Laterality Date   CAROTID STENT Right 06-13-12; 05/04/2018   CAROTID STENT INSERTION N/A 06/13/2012   Procedure: CAROTID STENT INSERTION;  Surgeon: Serafina Mitchell, MD;  Location[facility] CATH LAB;  Service: Cardiovascular;  Laterality: N/A;   CATARACT EXTRACTION, BILATERAL Bilateral    COLONOSCOPY  11/10/2011   Dr. [name]: hemorrhoids,  tubular adenoma   CORNEAL TRANSPLANT Bilateral    CORONARY ARTERY BYPASS GRAFT  ~ 2003   "CABG X3"   CORONARY BALLOON ANGIOPLASTY N/A 11/26/2020   Procedure: CORONARY BALLOON ANGIOPLASTY;  Surgeon: Troy Sine, MD;  Location: [place] CV LAB;  Service: Cardiovascular;  Laterality: N/A;   CORONARY STENT INTERVENTION N/A 06/05/2020   Procedure: CORONARY STENT INTERVENTION;  Surgeon: Lorretta Harp, MD;  Location: [place] CV LAB;  Service: Cardiovascular;  Laterality: N/A;   EYE SURGERY     INSERTION OF RETROGRADE CAROTID STENT Right 05/04/2018   Procedure: INSERTION OF RIGHT  CAROTID STENT using an ABBOT- XACT carotid stent system;  Surgeon: Serafina Mitchell, MD;  Location: [place];  Service: Vascular;  Laterality: Right;   LAPAROSCOPIC CHOLECYSTECTOMY  2007   LEFT HEART CATH AND CORS/GRAFTS ANGIOGRAPHY N/A 10/07/2016   Procedure: Left Heart Cath and Cors/Grafts Angiography;  Surgeon: Troy Sine, MD;  Location: [place] CV LAB;  Service: Cardiovascular;  Laterality: N/A;   LEFT HEART CATH AND CORS/GRAFTS ANGIOGRAPHY N/A 06/05/2020   Procedure: LEFT HEART CATH AND CORS/GRAFTS ANGIOGRAPHY;  Surgeon: Lorretta Harp, MD;  Location: [place] CV LAB;  Service: Cardiovascular;  Laterality: N/A;   LEFT HEART CATH AND CORS/GRAFTS ANGIOGRAPHY N/A 11/26/2020   Procedure: LEFT HEART CATH AND CORS/GRAFTS ANGIOGRAPHY;  Surgeon: Troy Sine, MD;  Location: [place] CV LAB;  Service: Cardiovascular;  Laterality: N/A;   PARTIAL  LARYNGECTOMY  1998   TRACHEOSTOMY  03/13/2018   "@ [facility]"     Allergies  Allergen Reactions   Nifedipine Other (See Comments)   Lorazepam Other (See Comments)    REACTION: Alters mental status. "Turns into maniac" REACTION: Alters mental status. "Turns into maniac" REACTION: Alters mental status. "Turns into maniac"   Other     REACTION: Unknown to patient. States that MD states allergy per medical records   Penicillins Hives    Has patient had a PCN reaction causing immediate rash, facial/tongue/throat swelling, SOB or lightheadedness with hypotension: Yes Has patient had a PCN reaction causing severe rash involving mucus membranes or skin necrosis: No Has patient had a PCN reaction that required hospitalization No Has patient had a PCN reaction occurring within the last 10 years: No If all of the above answers are "NO", then may proceed with Cephalosporin use.  HAS TOLERATED: cephalexin   Sulfacetamide     REACTION: Unknown to patient. States that MD states allergy per medical records   Sulfonamide Derivatives Other (See Comments)    REACTION: Unknown to patient. States that MD states allergy per medical records   Erythromycin Rash   Statins Itching and Rash    Rash on his trunk and arms, swelling in his lips   Vancomycin Itching      Family History  Problem Relation Age of Onset   Dementia Mother    Heart disease Father    Cancer Brother    Heart disease Brother    Hyperlipidemia Brother    Cancer Brother    Colon cancer Neg Hx      Social History Mr. Dierks reports that he quit smoking about 51 years ago. His smoking use included cigarettes. He started smoking about 69 years ago. He has a 15.00 pack-year smoking history. He has never been exposed to tobacco smoke. He quit smokeless tobacco use about 20 years ago.  His smokeless tobacco use included chew. Mr. Robarts reports no history of alcohol use.   Review of Systems  CONSTITUTIONAL: No weight loss,  fever, chills, weakness or fatigue.  HEENT: Eyes: No visual loss, blurred vision, double vision or yellow sclerae.No hearing loss, sneezing, congestion, runny nose or sore throat.  SKIN: No rash or itching.  CARDIOVASCULAR: per hpi RESPIRATORY: chronic SOB GASTROINTESTINAL: No anorexia, nausea, vomiting or diarrhea. No abdominal pain or blood.  GENITOURINARY: No burning on urination, no polyuria NEUROLOGICAL: No headache, dizziness, syncope, paralysis, ataxia, numbness or tingling in the extremities. No change in bowel or bladder control.  MUSCULOSKELETAL: No muscle, back pain, joint pain or stiffness.  LYMPHATICS: No enlarged nodes. No history of splenectomy.  PSYCHIATRIC: No history of depression or anxiety.  ENDOCRINOLOGIC: No reports of sweating, cold or heat intolerance. No polyuria or polydipsia.  Marland Kitchen   Physical Examination Today's Vitals   05/27/22 1330 05/27/22 1350  BP: (!) 152/72 (!) 144/78  Pulse: (!) 54   SpO2: 100%   Weight: 182 lb (82.6 kg)   Height: _0  (1.651 m)    Body mass index is 30.29 kg/m.  Gen: resting comfortably, no acute distress HEENT: no scleral icterus, pupils equal round and reactive, no palptable cervical adenopathy,  CV: RRR, no mrgn, no jvd Resp: Clear to auscultation bilaterally GI: abdomen is soft, non-tender, non-distended, normal bowel sounds, no hepatosplenomegaly MSK: extremities are warm, no edema.  Skin: warm, no rash Neuro:  no focal deficits Psych: appropriate affect   Diagnostic Studies   07/2011 MPI   Tomographic views were obtained using the short axis, vertical long axis, and horizontal long axis planes. No significant, reversible perfusion defects are noted to indicate ischemia.  Gated imaging reveals an EDV of 59, ESV of 18, T I D ratio of 0.80, and LVEF of 69%.  IMPRESSION: Low risk exercise/Lexiscan Myoview as outlined. No diagnostic ST- segment changes or arrhythmias were noted. There is evidence of soft tissue  attenuation, however no frank scar or ischemia. LVEF is normal at 69%.   04/2012 Echo   LVEF 65-70%, grade II diastolic dysfunction,     11/05/13 Echo Study Conclusions  - Left ventricle: The cavity size was normal. Wall thickness was increased in a pattern of mild LVH. Systolic function was vigorous. The estimated ejection fraction was in the range of 65% to 70%. Wall motion was normal; there were no regional wall motion abnormalities. Features are consistent with a pseudonormal left ventricular filling pattern, with concomitant abnormal relaxation and increased filling pressure (grade 2 diastolic dysfunction). - Aortic valve: Mildly calcified annulus. Trileaflet. Trivial regurgitation. - Mitral valve: Calcified annulus. Trivial regurgitation. - Left atrium: The atrium was mildly dilated. - Right ventricle: Systolic function was low normal. - Right atrium: Central venous pressure: 97m Hg (est). - Atrial septum: No defect or patent foramen ovale was identified. - Tricuspid valve: Trivial regurgitation. - Pulmonary arteries: Systolic pressure could not be accurately estimated. - Pericardium, extracardiac: There was no pericardial effusion. Impressions:  - Normal LV chamber size with mild LVH and LVEF 609-47% grade 2 diastolic dysfunction. MIld left atrial enlargement. Mild MAC. Low normal RV contraction. Unable to assess PASP - trivial tricuspid regurgitation.   11/2013 Lexiscan MPI IMPRESSION: 1.  Negative Lexiscan MPI for ischemia   2.  Normal left ventricular systolic function, LVEF 609%  3.  Low risk study for major cardiac events.     09/2016 cath LM lesion, 50 %stenosed. Prox LAD to Mid LAD lesion, 70 %stenosed. Ost 1st Mrg lesion, 100 %stenosed. RIMA and is normal in caliber and anatomically normal. RPDA-2 lesion,  50 %stenosed. RPDA-1 lesion, 60 %stenosed. LIMA. The left ventricular systolic function is normal. LV end diastolic pressure is normal.   Normal  LV function without focal segmental wall motion abnormalities.   Multivessel  native CAD with 50% ostial stenosis of the LAD and diffuse 70% mid stenoses, occlusion of the OM1 vessel of the circumflex, and diffuse 60 and 50% PDA stenoses in the small caliber PDA vessel.   Patent sequential LIMA graft supplying a twin like diagonal and LAD system system.   Patent RIMA graft supplying the circumflex marginal vessel.   RECOMMENDATION: Medical therapy.     10/2017 echo Study Conclusions   - Left ventricle: The cavity size was normal. Wall thickness was   normal. Systolic function was normal. The estimated ejection   fraction was in the range of 60% to 65%. Wall motion was normal;   there were no regional wall motion abnormalities. Left   ventricular diastolic function parameters were normal. - Aortic valve: Mildly calcified annulus. Trileaflet; mildly   thickened leaflets. Valve area (VTI): 3.01 cm^2. Valve area   (Vmax): 2.24 cm^2. Valve area (Vmean): 2.3 cm^2. - Mitral valve: Mildly calcified annulus. Mildly thickened leaflets   . - Left atrium: The atrium was mildly dilated.   12/2017 nuclear stress No diagnostic ST segment changes to indicate ischemia. No significant myocardial perfusion defects to indicate scar or ischemia. This is a low risk study. Nuclear stress EF: 67%.     11/2020 cath Previously placed Prox RCA stent (unknown type) is widely patent. Ost RCA lesion is 30% stenosed. Mid RCA lesion is 95% stenosed. Ost LM to Mid LM lesion is 80% stenosed. Ost Cx to Prox Cx lesion is 95% stenosed. Post intervention, there is a 0% residual stenosis. Ost LAD to Prox LAD lesion is 80% stenosed. Prox LAD lesion is 90% stenosed. 2nd Diag lesion is 80% stenosed. RV Thomas Mabry lesion is 80% stenosed.   Severe native CAD with previously noted high-grade stenoses in the left main, proximal and mid LAD,and proximal circumflex.    Mild 30% proximal narrowing in the RCA prior to the  proximal patent  stent but evidence for 95 to 99% in-stent restenosis in the mid RCA stent.   Patent sequential LIMA graft supplying the diagonal 1 and mid LAD.   Patent RIMA graft supplying the OM vessel.   Successful PCI to the mid RCA stent utilizing Wolverine cutting balloon 2.0 x 10 mm, 3.0 x 15 mm and ultimate 3.25 x 15 mm noncompliant balloon dilatations with the 95% stenosis being reduced to 0%.   LVEDP 21 mm Hg.   POST CATH RECOMMENDATION: Continue DAPT indefinitely.  Medical therapy for concomitant CAD.  Continue aggressive lipid-lowering therapy with target LDL less than 70 and optimal blood pressure control.   11/2020 carotid US IMPRESSION: 1. Severe (70-99%) stenosis proximal left internal carotid artery secondary to bulky, heterogeneous and calcified atherosclerotic plaque. 2. Mild (1-49%) stenosis proximal right internal carotid artery secondary to heterogenous atherosclerotic plaque. 3. Patent right common carotid artery stent. 4. Vertebral arteries are patent with normal antegrade flow.   11/2020 echo IMPRESSIONS     1. Left ventricular ejection fraction, by estimation, is 65 to 70%. The  left ventricle has normal function. The left ventricle has no regional  wall motion abnormalities. Left ventricular diastolic parameters were  normal.   2. Right ventricular systolic function is normal. The right ventricular  size is normal. There is normal pulmonary artery systolic pressure. The  estimated right ventricular  systolic pressure is 02.7 mmHg.   3. Left atrial size was mildly dilated.   4. The mitral valve is grossly normal. Trivial mitral valve  regurgitation.   5. The aortic valve is tricuspid. There is moderate calcification of the  aortic valve. Aortic valve regurgitation is not visualized. Mild to  moderate aortic valve sclerosis/calcification is present, without any  evidence of aortic stenosis. Aortic valve  mean gradient measures 7.0 mmHg.   6. The  inferior vena cava is normal in size with greater than 50%  respiratory variability, suggesting right atrial pressure of 3 mmHg.   Assessment and Plan   1. CAD   -no symptoms. Most recent MI in [facility] with 3 stents to RCA 08/2021 - continue current meds. Has been committed to lifelong DAPT   2. Hyperlipidemia   - continue zetia and praluent.  - request labs from pcp   3. HTN   -elevated here but home numbers at goal, continue curren tmeds   4. Carotid stenosis   - recs have been for DAPT for his carotid stent          Arnoldo Lenis, M.D.

## 2022-05-27 NOTE — Patient Instructions (Signed)
Medication Instructions:  Your physician recommends that you continue on your current medications as directed. Please refer to the Current Medication list given to you today.   Labwork: None  Testing/Procedures: None  Follow-Up: Follow up in 6 months with Dr. [name].   Any Other Special Instructions Will Be Listed Below (If Applicable).     If you need a refill on your cardiac medications before your next appointment, please call your pharmacy.

## 2022-05-30 ENCOUNTER — Emergency Department (HOSPITAL_COMMUNITY): Payer: Medicare HMO

## 2022-05-30 ENCOUNTER — Other Ambulatory Visit: Payer: Self-pay

## 2022-05-30 ENCOUNTER — Emergency Department (HOSPITAL_COMMUNITY)
Admission: EM | Admit: 2022-05-30 | Discharge: 2022-05-30 | Disposition: A | Discharge status: Home / Self Care | Payer: Medicare HMO | Attending: Emergency Medicine | Admitting: Emergency Medicine

## 2022-05-30 ENCOUNTER — Encounter (HOSPITAL_COMMUNITY): Payer: Self-pay | Admitting: *Deleted

## 2022-05-30 DIAGNOSIS — I2510 Atherosclerotic heart disease of native coronary artery without angina pectoris: Secondary | ICD-10-CM | POA: Insufficient documentation

## 2022-05-30 DIAGNOSIS — I129 Hypertensive chronic kidney disease with stage 1 through stage 4 chronic kidney disease, or unspecified chronic kidney disease: Secondary | ICD-10-CM | POA: Insufficient documentation

## 2022-05-30 DIAGNOSIS — Z8521 Personal history of malignant neoplasm of larynx: Secondary | ICD-10-CM | POA: Insufficient documentation

## 2022-05-30 DIAGNOSIS — R0602 Shortness of breath: Secondary | ICD-10-CM | POA: Insufficient documentation

## 2022-05-30 DIAGNOSIS — I10 Essential (primary) hypertension: Secondary | ICD-10-CM

## 2022-05-30 DIAGNOSIS — N183 Chronic kidney disease, stage 3 unspecified: Secondary | ICD-10-CM | POA: Insufficient documentation

## 2022-05-30 DIAGNOSIS — Z7982 Long term (current) use of aspirin: Secondary | ICD-10-CM | POA: Insufficient documentation

## 2022-05-30 DIAGNOSIS — R079 Chest pain, unspecified: Secondary | ICD-10-CM | POA: Diagnosis present

## 2022-05-30 LAB — CBC WITH DIFFERENTIAL/PLATELET
Abs Immature Granulocytes: 0.02 10*3/uL (ref 0.00–0.07)
Basophils Absolute: 0 10*3/uL (ref 0.0–0.1)
Basophils Relative: 0 %
Eosinophils Absolute: 0.2 10*3/uL (ref 0.0–0.5)
Eosinophils Relative: 2 %
HCT: 38.9 % — ABNORMAL LOW (ref 39.0–52.0)
Hemoglobin: 12.4 g/dL — ABNORMAL LOW (ref 13.0–17.0)
Immature Granulocytes: 0 %
Lymphocytes Relative: 25 %
Lymphs Abs: 1.9 10*3/uL (ref 0.7–4.0)
MCH: 28.6 pg (ref 26.0–34.0)
MCHC: 31.9 g/dL (ref 30.0–36.0)
MCV: 89.6 fL (ref 80.0–100.0)
Monocytes Absolute: 0.6 10*3/uL (ref 0.1–1.0)
Monocytes Relative: 8 %
Neutro Abs: 4.8 10*3/uL (ref 1.7–7.7)
Neutrophils Relative %: 65 %
Platelets: 216 10*3/uL (ref 150–400)
RBC: 4.34 MIL/uL (ref 4.22–5.81)
RDW: 16.1 % — ABNORMAL HIGH (ref 11.5–15.5)
WBC: 7.5 10*3/uL (ref 4.0–10.5)
nRBC: 0 % (ref 0.0–0.2)

## 2022-05-30 LAB — TROPONIN I (HIGH SENSITIVITY)
Troponin I (High Sensitivity): 16 ng/L (ref <18)
Troponin I (High Sensitivity): 15 ng/L (ref <18)

## 2022-05-30 LAB — BASIC METABOLIC PANEL
Anion gap: 6 (ref 5–15)
BUN: 15 mg/dL (ref 8–23)
CO2: 26 mmol/L (ref 22–32)
Calcium: 8.9 mg/dL (ref 8.9–10.3)
Chloride: 108 mmol/L (ref 98–111)
Creatinine, Ser: 1.39 mg/dL — ABNORMAL HIGH (ref 0.61–1.24)
GFR, Estimated: 50 mL/min — ABNORMAL LOW (ref >60)
Glucose, Bld: 89 mg/dL (ref 70–99)
Potassium: 4.3 mmol/L (ref 3.5–5.1)
Sodium: 140 mmol/L (ref 135–145)

## 2022-05-30 LAB — BRAIN NATRIURETIC PEPTIDE: B Natriuretic Peptide: 196 pg/mL — ABNORMAL HIGH (ref 0.0–100.0)

## 2022-05-30 NOTE — Discharge Instructions (Signed)
Take one of the amlodipine tablets tonight.  Call Dr. [name] tomorrow for further blood pressure adjusting.

## 2022-05-30 NOTE — ED Provider Notes (Signed)
[facility] EMERGENCY DEPARTMENT Provider Note   CSN: [id number] Arrival date & time: 05/30/22  0623     History  Chief Complaint  Patient presents with   Hypertension    Kenneth [name] is a 81 y.o. male.   Hypertension  Patient with some chest pain and hypertension.  States blood pressures been elevated since Thursday.  History of hypertension.  Had seen cardiologist on Thursday.  Blood pressure found be elevated up to 762 systolic.  At times chest to feel little tight.  No numbness weakness.  Does not really feel short of breath more than baseline.  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  No swelling in his legs.  Blood pressure normally will run around 831 systolic.    Past Medical History:  Diagnosis Date   Acute ST elevation myocardial infarction (STEMI) of inferior wall ([place]) 03/01/2020   [facility]   Anxiety    Arthritis    Back (05/04/2018)   Carotid artery disease ([place])    a. h/o R carotid stent.   Chronic lower back pain    "have it at night" (05/04/2018)   CKD (chronic kidney disease), stage III ([place])    Coronary artery disease    s/p CABG in 2003, patent grafts by cath in 2007, s/p inferior STEMI in 02/2020 with DES to RCA, s/p NSTEMI in 05/2020 with DES to proximal-RCA, s/p NSTEMI in 11/2020 with balloon angioplasty to mid-RCA and STEMI in 08/2021 with DESx3 to RCA   CVA (cerebral vascular accident) ([place]) 10/2017   "little numb on my left face since" (05/04/2018)   Depression    GERD (gastroesophageal reflux disease)    High triglycerides    History of kidney stones    Hypertension    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  Authorizing Provider  acetaminophen (TYLENOL) 500 MG tablet Take 500 mg by mouth every 6 (six) hours as needed.   Yes [provider]  aspirin EC 81 MG tablet Take 81 mg by mouth daily. Swallow whole.   Yes [provider]  BRILINTA 90 MG TABS tablet Take 90 mg by mouth 2 (two) times daily. 09/03/21  Yes [provider]  ezetimibe (ZETIA) 10 MG tablet Take 1 tablet by mouth once daily 03/30/22  Yes Branch, Alphonse Guild, MD  nitroGLYCERIN (NITROSTAT) 0.4 MG SL tablet Place 1 tablet (0.4 mg total) under the tongue every 5 (five) minutes as needed for chest pain. 01/31/17 05/28/23 Yes Lendon Colonel, NP  pantoprazole (PROTONIX) 40 MG tablet Take 1 tablet by mouth once daily 07/28/21  Yes Branch, Alphonse Guild, MD  PARoxetine (PAXIL) 20 MG tablet Take 20 mg by mouth at bedtime.   Yes [provider]  PRALUENT 75 MG/ML SOAJ INJECT 1 [place] TWO WEEKS Patient taking differently: Inject 75 mg into the skin every 14 (fourteen) days. 03/25/22  Yes BranchAlphonse Guild, MD      Allergies    Nifedipine, Lorazepam, Other, Penicillins, Sulfacetamide, Sulfonamide derivatives, Erythromycin, Statins, and Vancomycin    Review of Systems   Review of Systems  Physical Exam Updated Vital Signs  BP (!) 194/91   Pulse 71   Temp 98.2 F (36.8 C)   Resp (!) 23   Ht '5\' 5"'$  (1.651 m)   Wt 82.6 kg   SpO2 100%   BMI 30.29 kg/m  Physical Exam Vitals reviewed.  Neck:     Comments: Has chronic trach. Cardiovascular:     Rate and Rhythm: Regular rhythm.  Pulmonary:     Breath sounds: No rales.  Chest:     Chest wall: No tenderness.  Abdominal:     Tenderness: There is no abdominal tenderness.  Musculoskeletal:     Cervical back: Neck supple.     Right lower leg: No edema.     Left lower leg: No edema.  Skin:    General: Skin is warm.     Capillary Refill: Capillary refill takes less than 2 seconds.  Neurological:     Mental Status: He is alert and oriented to  person, place, and time.     ED Results / Procedures / Treatments   Labs (all labs ordered are listed, but only abnormal results are displayed) Labs Reviewed  CBC WITH DIFFERENTIAL/PLATELET - Abnormal; Notable for the following components:      Result Value   Hemoglobin 12.4 (*)    HCT 38.9 (*)    RDW 16.1 (*)    All other components within normal limits  BASIC METABOLIC PANEL - Abnormal; Notable for the following components:   Creatinine, Ser 1.39 (*)    GFR, Estimated 50 (*)    All other components within normal limits  BRAIN NATRIURETIC PEPTIDE - Abnormal; Notable for the following components:   B Natriuretic Peptide 196.0 (*)    All other components within normal limits  TROPONIN I (HIGH SENSITIVITY)  TROPONIN I (HIGH SENSITIVITY)    EKG EKG Interpretation  Date/Time:  Sunday May 30 2022 10:50:00 EST Ventricular Rate:  61 PR Interval:  154 QRS Duration: 126 QT Interval:  460 QTC Calculation: 463 R Axis:   0 Text Interpretation: Sinus rhythm with occasional Premature ventricular complexes Right bundle branch block Minimal voltage criteria for LVH, may be normal variant ( R in aVL ) Inferior infarct , age undetermined Abnormal ECG When compared with ECG of 14-Nov-2021 21:06, No significant change since last tracing Confirmed by Davonna Belling [phone]) on 05/30/2022 1:44:13 PM  Radiology DG Chest Portable 1 View  Result Date: 05/30/2022 CLINICAL DATA:  Chest pain.  Coronary artery disease.  Tracheostomy. EXAM: PORTABLE CHEST 1 VIEW COMPARISON:  11/12/2021 FINDINGS: The heart size and mediastinal contours are within normal limits. Tracheostomy tube remains in expected position. Prior CABG again noted. Chronic pulmonary interstitial prominence is unchanged. No evidence of superimposed pulmonary infiltrate or pleural effusion. Stent graft again seen in right neck. IMPRESSION: Stable chronic pulmonary interstitial prominence. No acute findings. Electronically Signed   By:  Marlaine Hind M.D.   On: 05/30/2022 11:53    Procedures Procedures    Medications Ordered in ED Medications - No data to display  ED Course/ Medical Decision Making/ A&P                           Medical Decision Making Amount and/or Complexity of Data Reviewed Labs: ordered. Radiology: ordered.   Patient shortness of breath slight chest pain and hypertension.  Previous MI.  EKG reassuring.  Blood pressure is mildly elevated around now 160/80.  EKG reassuring.  Chest x-ray stable.  Troponin also stable.  Does  have a history hypertension.  ACS is a cause of chest pain is felt less likely.  Will recheck troponin and continue to monitor.  Reviewed previous cardiology note from last week.  Lab work reassuring.  Troponin negative x2.  EKG reassuring.  Chest x-ray reassuring.  Doubt cardiac ischemia.  However reviewing notes it appears that after treatment in [place] blood pressure medicines have been adjusted.  Had stopped 2 of the medicines.  I think for now restarting the amlodipine should help control blood pressure a little more.  Has had some continued hypertension here and has been high the last few days.  We will call Dr. Nelly Laurence office tomorrow about more adjustment of medications.  Will discharge home.        Final Clinical Impression(s) / ED Diagnoses Final diagnoses:  Hypertension, unspecified type    Rx / DC Orders ED Discharge Orders     None         Davonna Belling, MD 05/30/22 1544

## 2022-05-30 NOTE — ED Triage Notes (Signed)
Pt brought in from home with c/o hypertension since Thursday. Pt c/o slight headache and some mid chest pressure. Pt reports dizziness over the last week, but none today.

## 2022-05-31 ENCOUNTER — Telehealth: Payer: Self-pay | Admitting: Cardiology

## 2022-05-31 NOTE — Telephone Encounter (Signed)
Pt c/o BP issue: STAT if pt c/o blurred vision, one-sided weakness or slurred speech  1. What are your last 5 BP readings?   2. Are you having any other symptoms (ex. Dizziness, headache, blurred vision, passed out)?  Chest tightness   3. What is your BP issue?  Patient's wife states Saturday the patient's systolic BP got up to the 180's and     Pt c/o of Chest Pain: STAT if CP now or developed within 24 hours  1. Are you having CP right now?  No, chest tightness only occurred on Sat-Sun Patient's wife denies the patient ever had CP  2. Are you experiencing any other symptoms (ex. SOB, nausea, vomiting, sweating)?   3. How long have you been experiencing CP?  Tightness started on Saturday, 11/04 with elevated BP  4. Is your CP continuous or coming and going?    5. Have you taken Nitroglycerin?  No  ?

## 2022-05-31 NOTE — Telephone Encounter (Signed)
Wife states that pt BP was elevated on Saturday at 189/90 pt was not hurting at that time. On Sunday patient states he was having chest tightness. Pt was taken to the ER. Wife states that pt BP is still elevated at 170/86 this morning and at this time it is 150/108. Pt denies dizziness, SOB, headache and Chest pain. Please advise.

## 2022-06-01 ENCOUNTER — Encounter (HOSPITAL_COMMUNITY): Payer: Medicare HMO

## 2022-06-01 ENCOUNTER — Ambulatory Visit: Payer: Medicare HMO | Admitting: Vascular Surgery

## 2022-06-01 NOTE — Telephone Encounter (Signed)
ER note reviewed, did he restart the norvasc that the ER had recommended. If so monitor bp's this week and udpate [place] Friday  J Dyesha Henault MD

## 2022-06-01 NOTE — Telephone Encounter (Signed)
Son reports that pt is not taking Norvasc. Son is on the way to the pharmacy to pick up new Rx's from the ED. Son states that he feels the his father has not been taking his meds correctly. Son is going to prepare meds weekly for him. Son will also monitor BP and HR and update [place] on Friday. Please advise.

## 2022-06-01 NOTE — Telephone Encounter (Signed)
I think coreg and losartan are fine to start, can titrate up as needed. Keep [place] updated on bp's over the next few days  Zandra Abts MD

## 2022-06-01 NOTE — Telephone Encounter (Signed)
Wife [name] notified and voiced understanding.

## 2022-06-01 NOTE — Telephone Encounter (Signed)
Pt son calling back for an update. He states pt went back to [facility] ER yesterday because his bp was 206/111. They gave him medications below to start and would like a c/b with Dr. Nelly Laurence advise on if he should take these or not.    Coreg 6.25 mg 2x daily  Losartan '25mg'$  1x daily

## 2022-06-04 ENCOUNTER — Telehealth: Payer: Self-pay | Admitting: Cardiology

## 2022-06-04 NOTE — Telephone Encounter (Signed)
I will forward to Dr.Branch for review.

## 2022-06-04 NOTE — Telephone Encounter (Signed)
  Pt c/o BP issue: STAT if pt c/o blurred vision, one-sided weakness or slurred speech  1. What are your last 5 BP readings?  11.07 - 172/85 5:30 pm - 176/84  11.08 - 132/77 PM - 155/75  11.09 - 117/68 PM - 128/63  11.10 - 146/80  2. Are you having any other symptoms (ex. Dizziness, headache, blurred vision, passed out)? No symptoms   3. What is your BP issue? Pt's wife calling, they provided pt's BP reading, they would like to know if pt needs to continue taking Losartan and carvedilol prescribed by [facility] and what they need to do next

## 2022-06-07 MED ORDER — CARVEDILOL 6.25 MG PO TABS: 6.2500 mg | ORAL_TABLET | Freq: Two times a day (BID) | ORAL | 3 refills | Status: DC

## 2022-06-07 MED ORDER — LOSARTAN POTASSIUM 50 MG PO TABS: 50.0000 mg | ORAL_TABLET | Freq: Every day | ORAL | 3 refills | Status: DC

## 2022-06-07 NOTE — Telephone Encounter (Signed)
Wife verified Losartan 25 mg qd and Coreg 6.25 mg bid.   He will increase Losartan to 50 mg qd and monitor BP's

## 2022-06-07 NOTE — Telephone Encounter (Signed)
BP's still high at times, can we clarify taking losartan '25mg'$  daily and if so increase to '50mg'$  daily Can we update our list with the coreg and losartan he reported  J Jeramey Lanuza MD

## 2022-06-15 IMAGING — US US CAROTID DUPLEX BILAT
1 series · 13 of 24 positions shown · non-contrast
Comparison: None.

CLINICAL DATA: 79-year-old male with symptoms of cerebrovascular
accident and ataxia

EXAM:
BILATERAL CAROTID DUPLEX ULTRASOUND
TECHNIQUE: Gray scale imaging, color Doppler and duplex ultrasound were
performed of bilateral carotid and vertebral arteries in the neck.

[Series 1: us carotid bilateral · 13 of 69 slices shown]
[im 1/69]
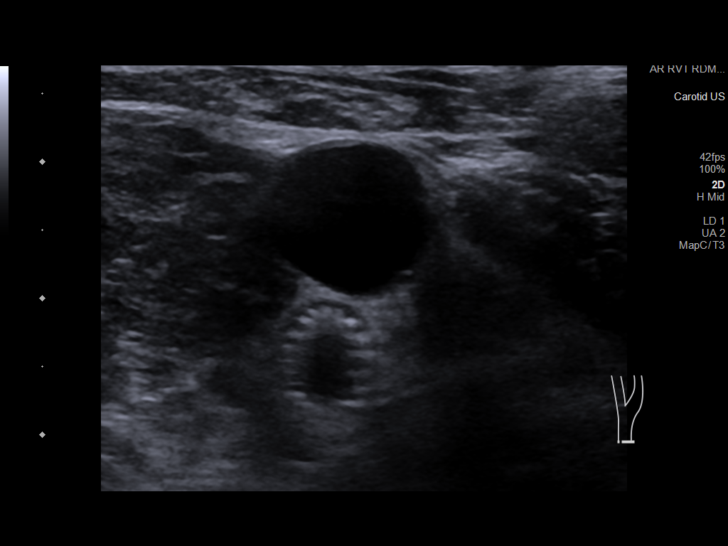
[im 6/69]
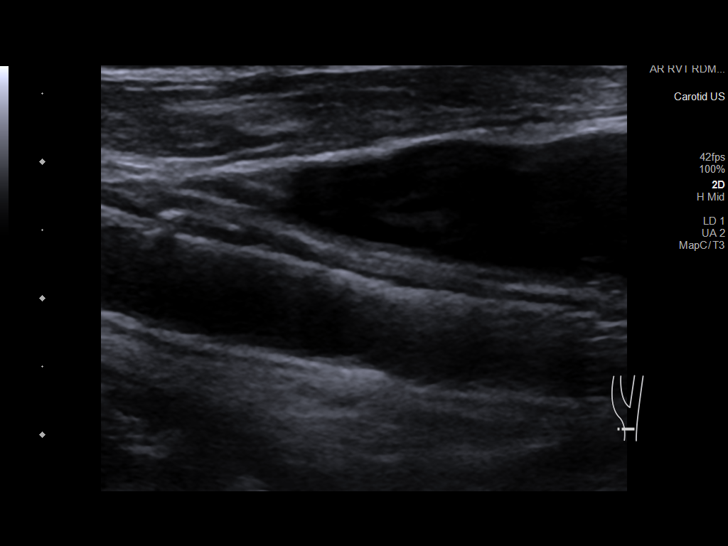
[im 12/69]
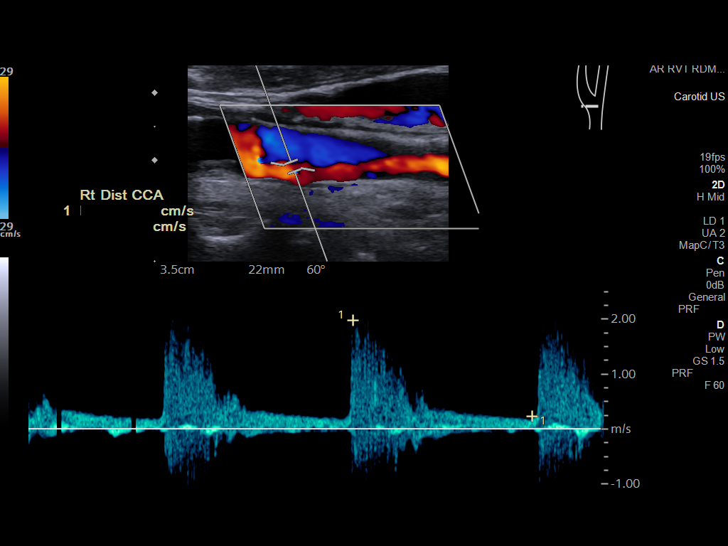
[im 18/69]
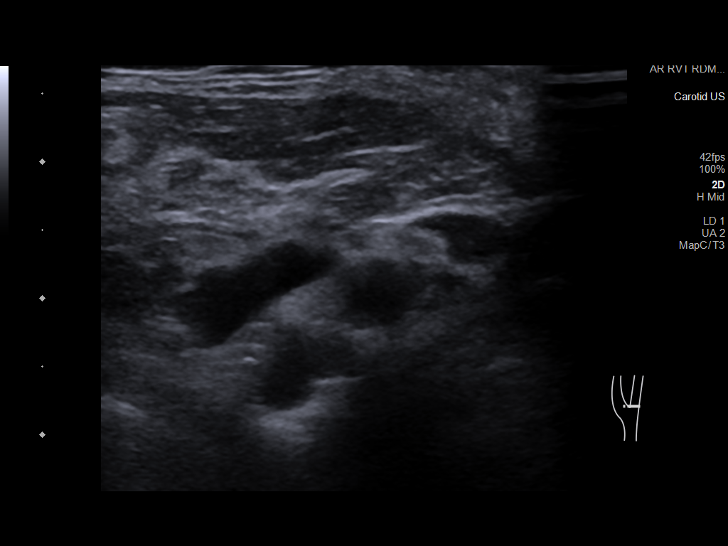
[im 24/69]
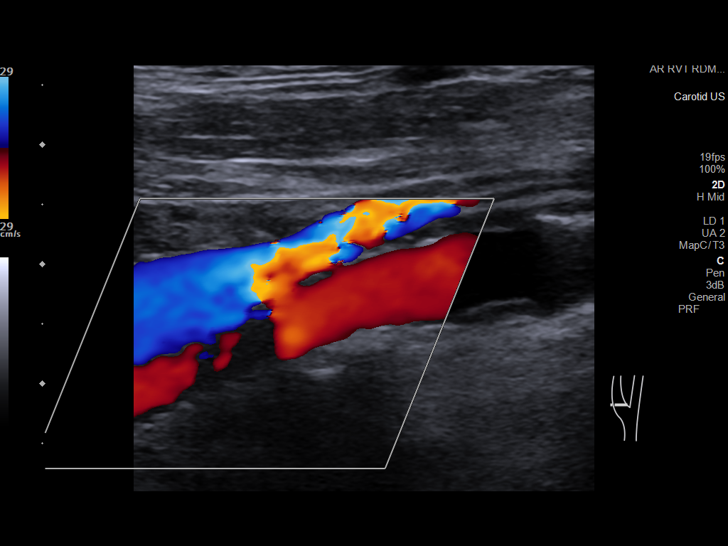
[im 30/69]
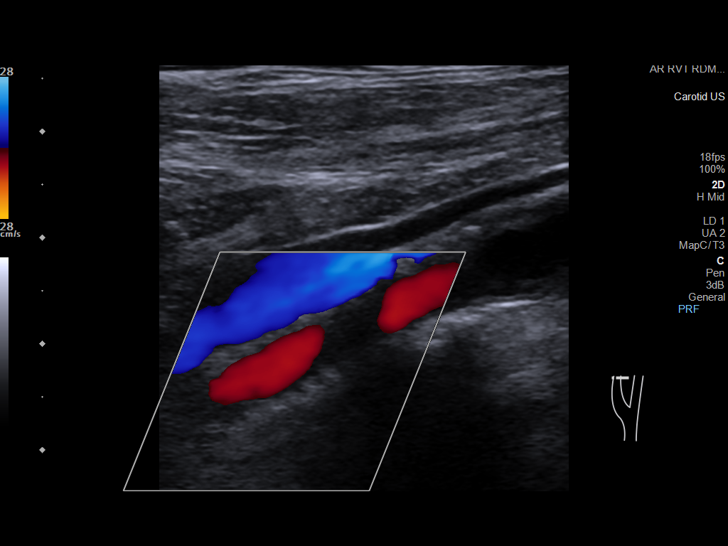
[im 36/69]
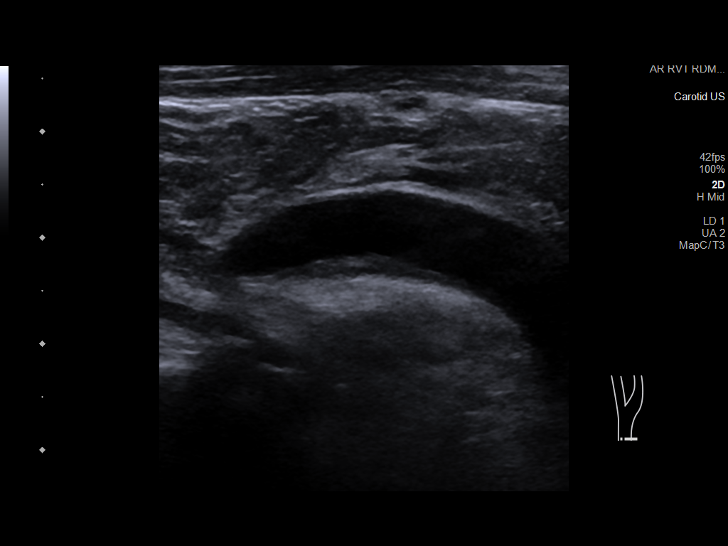
[im 39/69]
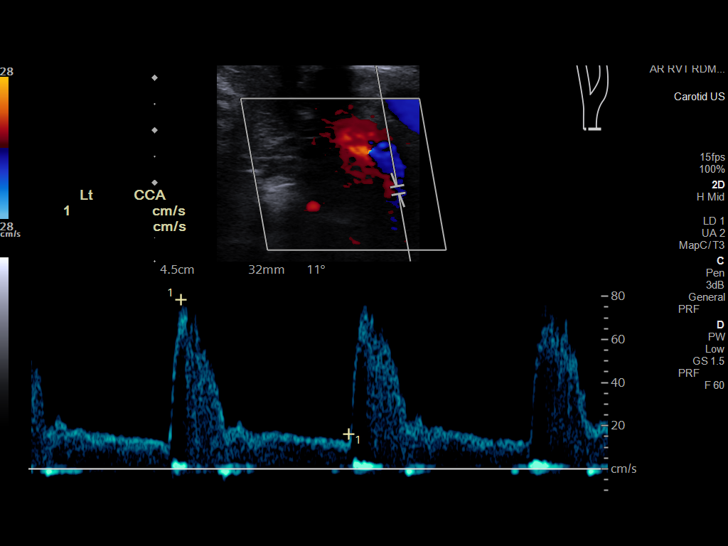
[im 45/69]
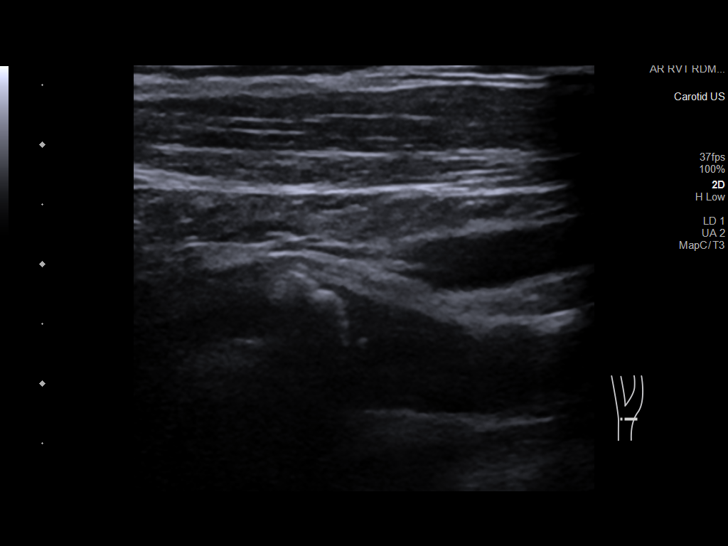
[im 51/69]
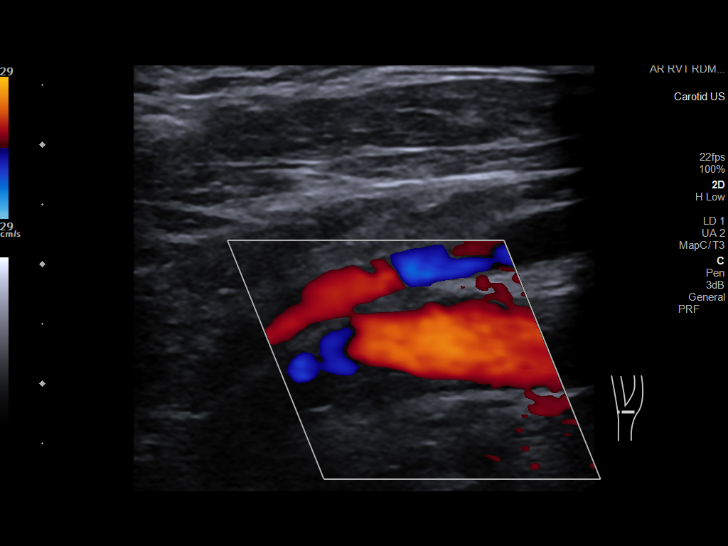
[im 57/69]
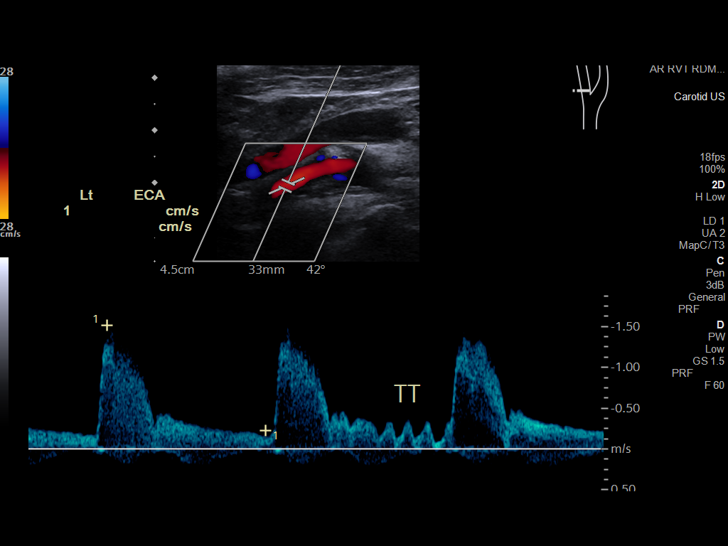
[im 63/69]
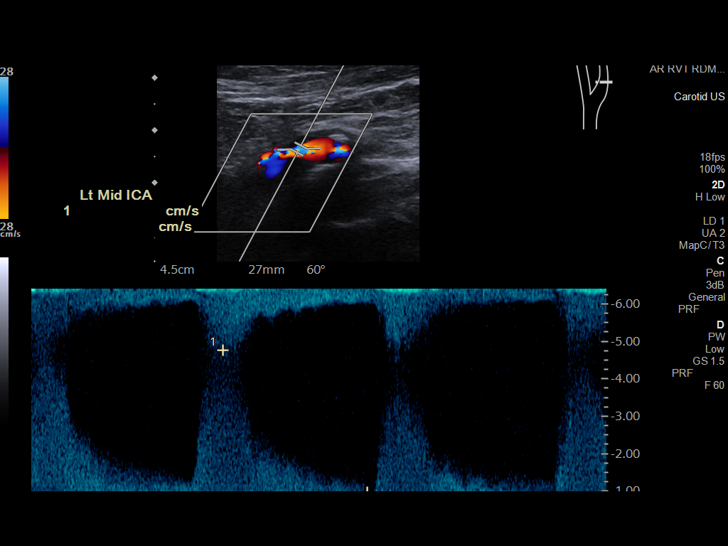
[im 69/69]
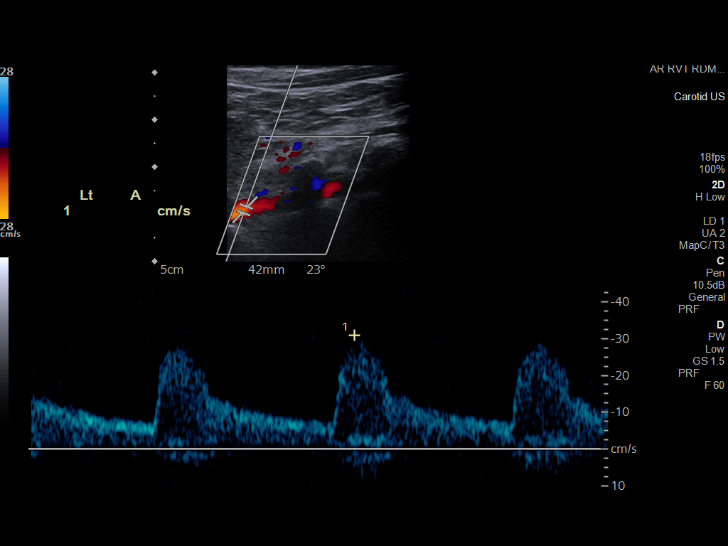

[13 of 24 positions shown; findings below may reference images not displayed]

FINDINGS: Criteria: Quantification of carotid stenosis is based on velocity
parameters that correlate the residual internal carotid diameter
with NASCET-based stenosis levels, using the diameter of the distal
internal carotid lumen as the denominator for stenosis measurement.

The following velocity measurements were obtained:

RIGHT
ICA: 69/15 cm/sec
CCA: 214/43 cm/sec

SYSTOLIC ICA/CCA RATIO:

ECA:  142 cm/sec

LEFT

ICA: 477/97 cm/sec

CCA: 89/15 cm/sec

SYSTOLIC ICA/CCA RATIO:

ECA:  152 cm/sec

RIGHT CAROTID ARTERY: Patent stent in the right distal common
carotid artery. Mild heterogeneous atherosclerotic plaque in the
proximal internal carotid artery. By peak systolic velocity
criteria, the estimated stenosis is less than 50%.

RIGHT VERTEBRAL ARTERY:  Patent with normal antegrade flow.

LEFT CAROTID ARTERY: Bulky calcified and heterogeneous
atherosclerotic plaque in the proximal internal carotid artery. By
peak systolic velocity criteria, the estimated stenosis is greater
than 70%. There is marked high velocity jetting and spectral
broadening in the region of stenosis consistent with turbulent flow.

LEFT VERTEBRAL ARTERY:  Patent with normal antegrade flow.
IMPRESSION: 1. Severe (70-99%) stenosis proximal left internal carotid artery
secondary to bulky, heterogeneous and calcified atherosclerotic
plaque.
2. Mild (1-49%) stenosis proximal right internal carotid artery
secondary to heterogenous atherosclerotic plaque.
3. Patent right common carotid artery stent.
4. Vertebral arteries are patent with normal antegrade flow.

## 2022-08-07 ENCOUNTER — Inpatient Hospital Stay (HOSPITAL_COMMUNITY)
Admission: EM | Admit: 2022-08-07 | Discharge: 2022-08-11 | DRG: 305 | Disposition: A | Discharge status: Home / Self Care | Payer: Medicare HMO | Attending: Internal Medicine | Admitting: Internal Medicine

## 2022-08-07 ENCOUNTER — Encounter (HOSPITAL_COMMUNITY): Payer: Self-pay

## 2022-08-07 ENCOUNTER — Other Ambulatory Visit: Payer: Self-pay

## 2022-08-07 ENCOUNTER — Emergency Department (HOSPITAL_COMMUNITY): Payer: Medicare HMO

## 2022-08-07 DIAGNOSIS — G8929 Other chronic pain: Secondary | ICD-10-CM | POA: Diagnosis present

## 2022-08-07 DIAGNOSIS — I252 Old myocardial infarction: Secondary | ICD-10-CM

## 2022-08-07 DIAGNOSIS — M479 Spondylosis, unspecified: Secondary | ICD-10-CM | POA: Diagnosis present

## 2022-08-07 DIAGNOSIS — I4510 Unspecified right bundle-branch block: Secondary | ICD-10-CM | POA: Diagnosis present

## 2022-08-07 DIAGNOSIS — Z8701 Personal history of pneumonia (recurrent): Secondary | ICD-10-CM

## 2022-08-07 DIAGNOSIS — I5022 Chronic systolic (congestive) heart failure: Secondary | ICD-10-CM | POA: Diagnosis present

## 2022-08-07 DIAGNOSIS — Z83438 Family history of other disorder of lipoprotein metabolism and other lipidemia: Secondary | ICD-10-CM

## 2022-08-07 DIAGNOSIS — Z947 Corneal transplant status: Secondary | ICD-10-CM

## 2022-08-07 DIAGNOSIS — I130 Hypertensive heart and chronic kidney disease with heart failure and stage 1 through stage 4 chronic kidney disease, or unspecified chronic kidney disease: Secondary | ICD-10-CM | POA: Diagnosis present

## 2022-08-07 DIAGNOSIS — F419 Anxiety disorder, unspecified: Secondary | ICD-10-CM | POA: Diagnosis present

## 2022-08-07 DIAGNOSIS — R000 Tachycardia, unspecified: Secondary | ICD-10-CM | POA: Diagnosis present

## 2022-08-07 DIAGNOSIS — K219 Gastro-esophageal reflux disease without esophagitis: Secondary | ICD-10-CM | POA: Diagnosis not present

## 2022-08-07 DIAGNOSIS — I10 Essential (primary) hypertension: Secondary | ICD-10-CM | POA: Diagnosis not present

## 2022-08-07 DIAGNOSIS — Z87891 Personal history of nicotine dependence: Secondary | ICD-10-CM

## 2022-08-07 DIAGNOSIS — I160 Hypertensive urgency: Secondary | ICD-10-CM | POA: Diagnosis present

## 2022-08-07 DIAGNOSIS — I255 Ischemic cardiomyopathy: Secondary | ICD-10-CM | POA: Diagnosis present

## 2022-08-07 DIAGNOSIS — R0789 Other chest pain: Secondary | ICD-10-CM | POA: Diagnosis not present

## 2022-08-07 DIAGNOSIS — D631 Anemia in chronic kidney disease: Secondary | ICD-10-CM | POA: Diagnosis present

## 2022-08-07 DIAGNOSIS — I2510 Atherosclerotic heart disease of native coronary artery without angina pectoris: Secondary | ICD-10-CM

## 2022-08-07 DIAGNOSIS — Z79899 Other long term (current) drug therapy: Secondary | ICD-10-CM

## 2022-08-07 DIAGNOSIS — Z951 Presence of aortocoronary bypass graft: Secondary | ICD-10-CM

## 2022-08-07 DIAGNOSIS — I4891 Unspecified atrial fibrillation: Secondary | ICD-10-CM | POA: Diagnosis present

## 2022-08-07 DIAGNOSIS — Z880 Allergy status to penicillin: Secondary | ICD-10-CM

## 2022-08-07 DIAGNOSIS — M545 Low back pain, unspecified: Secondary | ICD-10-CM | POA: Diagnosis present

## 2022-08-07 DIAGNOSIS — Z9861 Coronary angioplasty status: Secondary | ICD-10-CM

## 2022-08-07 DIAGNOSIS — E782 Mixed hyperlipidemia: Secondary | ICD-10-CM | POA: Diagnosis present

## 2022-08-07 DIAGNOSIS — Z930 Tracheostomy status: Secondary | ICD-10-CM

## 2022-08-07 DIAGNOSIS — Z888 Allergy status to other drugs, medicaments and biological substances status: Secondary | ICD-10-CM

## 2022-08-07 DIAGNOSIS — Z7982 Long term (current) use of aspirin: Secondary | ICD-10-CM

## 2022-08-07 DIAGNOSIS — Z8521 Personal history of malignant neoplasm of larynx: Secondary | ICD-10-CM

## 2022-08-07 DIAGNOSIS — I739 Peripheral vascular disease, unspecified: Secondary | ICD-10-CM | POA: Diagnosis present

## 2022-08-07 DIAGNOSIS — Z882 Allergy status to sulfonamides status: Secondary | ICD-10-CM

## 2022-08-07 DIAGNOSIS — Z881 Allergy status to other antibiotic agents status: Secondary | ICD-10-CM

## 2022-08-07 DIAGNOSIS — Z7902 Long term (current) use of antithrombotics/antiplatelets: Secondary | ICD-10-CM

## 2022-08-07 DIAGNOSIS — I169 Hypertensive crisis, unspecified: Secondary | ICD-10-CM | POA: Diagnosis present

## 2022-08-07 DIAGNOSIS — Z8673 Personal history of transient ischemic attack (TIA), and cerebral infarction without residual deficits: Secondary | ICD-10-CM

## 2022-08-07 DIAGNOSIS — F32A Depression, unspecified: Secondary | ICD-10-CM | POA: Insufficient documentation

## 2022-08-07 DIAGNOSIS — Z9049 Acquired absence of other specified parts of digestive tract: Secondary | ICD-10-CM

## 2022-08-07 DIAGNOSIS — Z8249 Family history of ischemic heart disease and other diseases of the circulatory system: Secondary | ICD-10-CM

## 2022-08-07 DIAGNOSIS — I2489 Other forms of acute ischemic heart disease: Secondary | ICD-10-CM | POA: Diagnosis present

## 2022-08-07 DIAGNOSIS — I200 Unstable angina: Principal | ICD-10-CM

## 2022-08-07 DIAGNOSIS — N1831 Chronic kidney disease, stage 3a: Secondary | ICD-10-CM | POA: Diagnosis present

## 2022-08-07 DIAGNOSIS — Z87442 Personal history of urinary calculi: Secondary | ICD-10-CM

## 2022-08-07 HISTORY — DX: Hyperlipidemia, unspecified: E78.5

## 2022-08-07 LAB — CBG MONITORING, ED: Glucose-Capillary: 126 mg/dL — ABNORMAL HIGH (ref 70–99)

## 2022-08-07 LAB — BASIC METABOLIC PANEL
Anion gap: 8 (ref 5–15)
BUN: 14 mg/dL (ref 8–23)
CO2: 24 mmol/L (ref 22–32)
Calcium: 8.8 mg/dL — ABNORMAL LOW (ref 8.9–10.3)
Chloride: 107 mmol/L (ref 98–111)
Creatinine, Ser: 1.45 mg/dL — ABNORMAL HIGH (ref 0.61–1.24)
GFR, Estimated: 48 mL/min — ABNORMAL LOW (ref >60)
Glucose, Bld: 110 mg/dL — ABNORMAL HIGH (ref 70–99)
Potassium: 3.6 mmol/L (ref 3.5–5.1)
Sodium: 139 mmol/L (ref 135–145)

## 2022-08-07 LAB — CBC
HCT: 39.2 % (ref 39.0–52.0)
Hemoglobin: 12.7 g/dL — ABNORMAL LOW (ref 13.0–17.0)
MCH: 29.5 pg (ref 26.0–34.0)
MCHC: 32.4 g/dL (ref 30.0–36.0)
MCV: 91.2 fL (ref 80.0–100.0)
Platelets: 207 10*3/uL (ref 150–400)
RBC: 4.3 MIL/uL (ref 4.22–5.81)
RDW: 15.7 % — ABNORMAL HIGH (ref 11.5–15.5)
WBC: 8.2 10*3/uL (ref 4.0–10.5)
nRBC: 0 % (ref 0.0–0.2)

## 2022-08-07 LAB — PROTIME-INR
INR: 1 (ref 0.8–1.2)
Prothrombin Time: 12.7 seconds (ref 11.4–15.2)

## 2022-08-07 LAB — TROPONIN I (HIGH SENSITIVITY): Troponin I (High Sensitivity): 21 ng/L — ABNORMAL HIGH (ref <18)

## 2022-08-07 LAB — APTT: aPTT: 26 seconds (ref 24–36)

## 2022-08-07 MED ORDER — NITROGLYCERIN IN D5W 200-5 MCG/ML-% IV SOLN
5.0000 ug/min | INTRAVENOUS | Status: DC
  Administered 2022-08-07: 5 ug/min via INTRAVENOUS
  Administered 2022-08-08: 55 ug/min via INTRAVENOUS
  Filled 2022-08-07 (×2): qty 250

## 2022-08-07 MED ORDER — ASPIRIN 81 MG PO CHEW
324.0000 mg | CHEWABLE_TABLET | Freq: Once | ORAL | Status: AC
  Administered 2022-08-07: 324 mg via ORAL
  Filled 2022-08-07: qty 4

## 2022-08-07 MED ORDER — SODIUM CHLORIDE 0.9 % IV SOLN: INTRAVENOUS | Status: DC

## 2022-08-07 NOTE — H&P (Incomplete)
History and Physical    Patient: Kenneth [name]:[id number] DOB: June 25, 1941 DOA: 08/07/2022 DOS: the patient was seen and examined on 08/07/2022 PCP: [name], MD  Patient coming from: Home  Chief Complaint:  Chief Complaint  Patient presents with  . Chest Pain   HPI: Kenneth [name] is a 82 y.o. male with medical history significant of hypertension, hyperlipidemia, GERD, CAD s/p CABG who presents to the emergency department due to chest pain.  ED Course:  In the emergency department, BP was elevated at 184/83, respiratory rate was initially at 27/minute, BS eventually normalized, pulse 70 bpm, temperature 98.8 F, O2 sat 93% on room air.  Workup in the ED showed normocytic anemia and normal BMP except for blood glucose of 110 and creatinine of 1.45, troponin x 1 -21. Chest x-ray showed stable chest with no acute process Aspirin 324 mg Was Given, IV Hydration Was Provided and patient was started on IV nitroglycerin drip.  Cardiologist on-call (Dr. Patsey Berthold) was consulted and he agreed: Management of blood pressure control and the patient does not require any heparin drip unless troponin increase and the patient can be discharged if he improves and to have outpatient cardiology follow-up.  Hospitalist was asked to admit patient for further evaluation and management.   Review of Systems: Review of systems as noted in the HPI. All other systems reviewed and are negative.   Past Medical History:  Diagnosis Date  . Acute ST elevation myocardial infarction (STEMI) of inferior wall [facility]) 03/01/2020   [facility]  . Anxiety   . 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 Carotid artery disease ([place])    a. h/o R carotid stent.  . Chronic lower back pain    "have it at night" (05/04/2018)  . CKD (chronic kidney disease), stage III ([place])   . Coronary artery disease    s/p CABG in 2003, patent grafts by cath in 2007, s/p inferior STEMI in 02/2020 with DES to RCA, s/p NSTEMI in  05/2020 with DES to proximal-RCA, s/p NSTEMI in 11/2020 with balloon angioplasty to mid-RCA and STEMI in 08/2021 with DESx3 to RCA  . CVA (cerebral vascular accident) ([place]) 10/2017   "little numb on my left face since" (05/04/2018)  . Depression   . GERD (gastroesophageal reflux disease)   . High triglycerides   . History of kidney stones   . Hypertension   . 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 Peripheral vascular disease ([place])   . Pneumonia 11/11/2021  . Sinus bradycardia   . Stroke [facility])    "he's had several little strokes; many that he wasn't aware of" (05/04/2018)  . Tobacco abuse   . Tracheal stenosis    Past Surgical History:  Procedure Laterality Date  . CAROTID STENT Right 06-13-12; 05/04/2018  . CAROTID STENT INSERTION N/A 06/13/2012   Procedure: CAROTID STENT INSERTION;  Surgeon: Serafina Mitchell, MD;  Location: [facility] CATH LAB;  Service: Cardiovascular;  Laterality: N/A;  . CATARACT EXTRACTION, BILATERAL Bilateral   . COLONOSCOPY  11/10/2011   Dr. [name]: hemorrhoids, tubular adenoma  . CORNEAL TRANSPLANT Bilateral   . CORONARY ARTERY BYPASS GRAFT  ~ 2003   "CABG X3"  . CORONARY BALLOON ANGIOPLASTY N/A 11/26/2020   Procedure: CORONARY BALLOON ANGIOPLASTY;  Surgeon: Troy Sine, MD;  Location: [place] CV LAB;  Service: Cardiovascular;  Laterality: N/A;  . CORONARY STENT INTERVENTION N/A 06/05/2020   Procedure: CORONARY STENT INTERVENTION;  Surgeon: Lorretta Harp, MD;  Location:  [place] INVASIVE CV LAB;  Service: Cardiovascular;  Laterality: N/A;  . EYE SURGERY    . INSERTION OF RETROGRADE CAROTID STENT Right 05/04/2018   Procedure: INSERTION OF RIGHT  CAROTID STENT using an ABBOT- XACT carotid stent system;  Surgeon: Serafina Mitchell, MD;  Location: [place];  Service: Vascular;  Laterality: Right;  . LAPAROSCOPIC CHOLECYSTECTOMY  2007  . LEFT HEART CATH AND CORS/GRAFTS ANGIOGRAPHY N/A 10/07/2016   Procedure: Left Heart Cath and Cors/Grafts Angiography;  Surgeon: Troy Sine, MD;  Location: [place] CV LAB;  Service: Cardiovascular;  Laterality: N/A;  . LEFT HEART CATH AND CORS/GRAFTS ANGIOGRAPHY N/A 06/05/2020   Procedure: LEFT HEART CATH AND CORS/GRAFTS ANGIOGRAPHY;  Surgeon: Lorretta Harp, MD;  Location: [place] CV LAB;  Service: Cardiovascular;  Laterality: N/A;  . LEFT HEART CATH AND CORS/GRAFTS ANGIOGRAPHY N/A 11/26/2020   Procedure: LEFT HEART CATH AND CORS/GRAFTS ANGIOGRAPHY;  Surgeon: Troy Sine, MD;  Location: [place] CV LAB;  Service: Cardiovascular;  Laterality: N/A;  . PARTIAL LARYNGECTOMY  1998  . TRACHEOSTOMY  03/13/2018   "@ [facility]"    Social History:  reports that he quit smoking about 52 years ago. His smoking use included cigarettes. He started smoking about 69 years ago. He has a 15.00 pack-year smoking history. He has never been exposed to tobacco smoke. He quit smokeless tobacco use about 20 years ago.  His smokeless tobacco use included chew. He reports that he does not drink alcohol and does not use drugs.   Allergies  Allergen Reactions  . Nifedipine Other (See Comments)  . Lorazepam Other (See Comments)    REACTION: Alters mental status. "Turns into maniac"  . Other     REACTION: Unknown to patient. States that MD states allergy per medical records  . Penicillins Hives    Has patient had a PCN reaction causing immediate rash, facial/tongue/throat swelling, SOB or lightheadedness with hypotension: Yes Has patient had a PCN reaction causing severe rash involving mucus membranes or skin necrosis: No Has patient had a PCN reaction that required hospitalization No Has patient had a PCN reaction occurring within the last 10 years: No If all of the above answers are "NO", then may proceed with Cephalosporin use.  HAS TOLERATED: cephalexin  . Rosuvastatin Other (See Comments)  . Sulfacetamide     REACTION: Unknown to patient. States that MD states allergy per medical records  . Sulfonamide Derivatives Other (See  Comments)    REACTION: Unknown to patient. States that MD states allergy per medical records  . Erythromycin Rash  . Statins Itching and Rash    Rash on his trunk and arms, swelling in his lips  . Vancomycin Itching    Family History  Problem Relation Age of Onset  . Dementia Mother   . Heart disease Father   . Cancer Brother   . Heart disease Brother   . Hyperlipidemia Brother   . Cancer Brother   . Colon cancer Neg Hx     ***  Prior to Admission medications   Medication Sig Start Date End Date Taking? Authorizing Provider  carvedilol (COREG) 6.25 MG tablet Take 1 tablet (6.25 mg total) by mouth 2 (two) times daily. 06/07/22   Arnoldo Lenis, MD  acetaminophen (TYLENOL) 500 MG tablet Take 500 mg by mouth every 6 (six) hours as needed.    [provider]  aspirin EC 81 MG tablet Take 81 mg by mouth daily. Swallow whole.    [provider]  BRILINTA 90 MG TABS tablet Take 90 mg by mouth 2 (two) times daily. 09/03/21   [provider]  ezetimibe (ZETIA) 10 MG tablet Take 1 tablet by mouth once daily 03/30/22   Arnoldo Lenis, MD  losartan (COZAAR) 50 MG tablet Take 1 tablet (50 mg total) by mouth daily. 06/07/22 06/07/23  Arnoldo Lenis, MD  nitroGLYCERIN (NITROSTAT) 0.4 MG SL tablet Place 1 tablet (0.4 mg total) under the tongue every 5 (five) minutes as needed for chest pain. 01/31/17 05/28/23  Lendon Colonel, NP  pantoprazole (PROTONIX) 40 MG tablet Take 1 tablet by mouth once daily 07/28/21   Arnoldo Lenis, MD  PARoxetine (PAXIL) 20 MG tablet Take 20 mg by mouth at bedtime.    [provider]  PRALUENT 75 MG/ML SOAJ INJECT [place] TWO WEEKS Patient taking differently: Inject 75 mg into the skin every 14 (fourteen) days. 03/25/22   Arnoldo Lenis, MD    Physical Exam: BP 128/63   Pulse (!) 58   Temp 98.8 F (37.1 C) (Oral)   Resp 20   Ht '5\' 5"'$  (1.651 m)   Wt 82.6 kg   SpO2 94%   BMI 30.30 kg/m    General: 82 y.o. year-old male well developed well nourished in no acute distress.  Alert and oriented x3. HEENT: NCAT, EOMI Neck: Supple, trachea medial Cardiovascular: Regular rate and rhythm with no rubs or gallops.  No thyromegaly or JVD noted.  No lower extremity edema. 2/4 pulses in all 4 extremities. Respiratory: Clear to auscultation with no wheezes or rales. Good inspiratory effort. Abdomen: Soft, nontender nondistended with normal bowel sounds x4 quadrants. Muskuloskeletal: No cyanosis, clubbing or edema noted bilaterally Neuro: CN II-XII intact, strength 5/5 x 4, sensation, reflexes intact Skin: No ulcerative lesions noted or rashes Psychiatry: Judgement and insight appear normal. Mood is appropriate for condition and setting          Labs on Admission:  Basic Metabolic Panel: Recent Labs  Lab 08/07/22 2155  NA 139  K 3.6  CL 107  CO2 24  GLUCOSE 110*  BUN 14  CREATININE 1.45*  CALCIUM 8.8*   Liver Function Tests: No results for input(s): "AST", "ALT", "ALKPHOS", "BILITOT", "PROT", "ALBUMIN" in the last 168 hours. No results for input(s): "LIPASE", "AMYLASE" in the last 168 hours. No results for input(s): "AMMONIA" in the last 168 hours. CBC: Recent Labs  Lab 08/07/22 2155  WBC 8.2  HGB 12.7*  HCT 39.2  MCV 91.2  PLT 207   Cardiac Enzymes: No results for input(s): "CKTOTAL", "CKMB", "CKMBINDEX", "TROPONINI" in the last 168 hours.  BNP (last 3 results) Recent Labs    05/30/22 1224  BNP 196.0*    ProBNP (last 3 results) No results for input(s): "PROBNP" in the last 8760 hours.  CBG: Recent Labs  Lab 08/07/22 2218  GLUCAP 126*    Radiological Exams on Admission: DG Chest Portable 1 View  Result Date: 08/07/2022 CLINICAL DATA:  Chest pain EXAM: PORTABLE CHEST 1 VIEW COMPARISON:  05/30/2022 FINDINGS: Single frontal view of the chest demonstrates tracheostomy tube unchanged. Cardiac silhouette is stable. Postsurgical changes from median  sternotomy. No acute airspace disease, effusion, or pneumothorax. Chronic elevation right hemidiaphragm. No acute bony abnormality. IMPRESSION: 1. Stable chest, no acute process. Electronically Signed   By: Randa Ngo M.D.   On: 08/07/2022 22:16    EKG: I independently viewed the EKG done and my findings are as followed:  Normal sinus rhythm at a rate of 70 bpm with multiple PVCs  Assessment/Plan Present on Admission: . Hypertensive urgency  Principal Problem:   Hypertensive urgency   Hypertensive emergency-resolved Essential hypertension  Elevated troponin possible secondary to type II demand ischemia Mixed hyperlipidemia GERD CAD s/p CABG x 3  Goals of care: Palliative care will be consulted  DVT prophylaxis: ***   Code Status: ***   Family Communication: ***   Disposition Plan: ***   Consults called: ***   Admission status: ***     Bernadette Hoit MD [facility] Pager [phone]  If 7PM-7AM, please contact night-coverage [email or web address] Password [facility]  08/07/2022, 11:49 PM   Review of Systems: {ROS_Text:26778} Past Medical History:  Diagnosis Date  . Acute ST elevation myocardial infarction (STEMI) of inferior wall [facility]) 03/01/2020   [facility]  . Anxiety   . 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 Carotid artery disease ([place])    a. h/o R carotid stent.  . Chronic lower back pain    "have it at night" (05/04/2018)  . CKD (chronic kidney disease), stage III ([place])   . Coronary artery disease    s/p CABG in 2003, patent grafts by cath in 2007, s/p inferior STEMI in 02/2020 with DES to RCA, s/p NSTEMI in 05/2020 with DES to proximal-RCA, s/p NSTEMI in 11/2020 with balloon angioplasty to mid-RCA and STEMI in 08/2021 with DESx3 to RCA  . CVA (cerebral vascular accident) ([place]) 10/2017   "little numb on my left face since" (05/04/2018)  . Depression   . GERD (gastroesophageal reflux disease)   . High triglycerides   . History of kidney  stones   . Hypertension   . 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 Peripheral vascular disease ([place])   . Pneumonia 11/11/2021  . Sinus bradycardia   . Stroke [facility])    "he's had several little strokes; many that he wasn't aware of" (05/04/2018)  . Tobacco abuse   . Tracheal stenosis    Past Surgical History:  Procedure Laterality Date  . CAROTID STENT Right 06-13-12; 05/04/2018  . CAROTID STENT INSERTION N/A 06/13/2012   Procedure: CAROTID STENT INSERTION;  Surgeon: Serafina Mitchell, MD;  Location: [facility] CATH LAB;  Service: Cardiovascular;  Laterality: N/A;  . CATARACT EXTRACTION, BILATERAL Bilateral   . COLONOSCOPY  11/10/2011   Dr. [name]: hemorrhoids, tubular adenoma  . CORNEAL TRANSPLANT Bilateral   . CORONARY ARTERY BYPASS GRAFT  ~ 2003   "CABG X3"  . CORONARY BALLOON ANGIOPLASTY N/A 11/26/2020   Procedure: CORONARY BALLOON ANGIOPLASTY;  Surgeon: Troy Sine, MD;  Location: [place] CV LAB;  Service: Cardiovascular;  Laterality: N/A;  . CORONARY STENT INTERVENTION N/A 06/05/2020   Procedure: CORONARY STENT INTERVENTION;  Surgeon: Lorretta Harp, MD;  Location: [place] CV LAB;  Service: Cardiovascular;  Laterality: N/A;  . EYE SURGERY    . INSERTION OF RETROGRADE CAROTID STENT Right 05/04/2018   Procedure: INSERTION OF RIGHT  CAROTID STENT using an ABBOT- XACT carotid stent system;  Surgeon: Serafina Mitchell, MD;  Location: [place];  Service: Vascular;  Laterality: Right;  . LAPAROSCOPIC CHOLECYSTECTOMY  2007  . LEFT HEART CATH AND CORS/GRAFTS ANGIOGRAPHY N/A 10/07/2016   Procedure: Left Heart Cath and Cors/Grafts Angiography;  Surgeon: Troy Sine, MD;  Location: [place] CV LAB;  Service: Cardiovascular;  Laterality: N/A;  . LEFT HEART CATH AND CORS/GRAFTS ANGIOGRAPHY N/A 06/05/2020   Procedure: LEFT HEART CATH AND CORS/GRAFTS ANGIOGRAPHY;  Surgeon:  Lorretta Harp, MD;  Location: [place] CV LAB;  Service: Cardiovascular;  Laterality: N/A;  . LEFT HEART CATH AND  CORS/GRAFTS ANGIOGRAPHY N/A 11/26/2020   Procedure: LEFT HEART CATH AND CORS/GRAFTS ANGIOGRAPHY;  Surgeon: Troy Sine, MD;  Location: [place] CV LAB;  Service: Cardiovascular;  Laterality: N/A;  . PARTIAL LARYNGECTOMY  1998  . TRACHEOSTOMY  03/13/2018   "@ [facility]"   Social History:  reports that he quit smoking about 52 years ago. His smoking use included cigarettes. He started smoking about 69 years ago. He has a 15.00 pack-year smoking history. He has never been exposed to tobacco smoke. He quit smokeless tobacco use about 20 years ago.  His smokeless tobacco use included chew. He reports that he does not drink alcohol and does not use drugs.  Allergies  Allergen Reactions  . Nifedipine Other (See Comments)  . Lorazepam Other (See Comments)    REACTION: Alters mental status. "Turns into maniac"  . Other     REACTION: Unknown to patient. States that MD states allergy per medical records  . Penicillins Hives    Has patient had a PCN reaction causing immediate rash, facial/tongue/throat swelling, SOB or lightheadedness with hypotension: Yes Has patient had a PCN reaction causing severe rash involving mucus membranes or skin necrosis: No Has patient had a PCN reaction that required hospitalization No Has patient had a PCN reaction occurring within the last 10 years: No If all of the above answers are "NO", then may proceed with Cephalosporin use.  HAS TOLERATED: cephalexin  . Rosuvastatin Other (See Comments)  . Sulfacetamide     REACTION: Unknown to patient. States that MD states allergy per medical records  . Sulfonamide Derivatives Other (See Comments)    REACTION: Unknown to patient. States that MD states allergy per medical records  . Erythromycin Rash  . Statins Itching and Rash    Rash on his trunk and arms, swelling in his lips  . Vancomycin Itching    Family History  Problem Relation Age of Onset  . Dementia Mother   . Heart disease Father   . Cancer Brother   .  Heart disease Brother   . Hyperlipidemia Brother   . Cancer Brother   . Colon cancer Neg Hx     Prior to Admission medications   Medication Sig Start Date End Date Taking? Authorizing Provider  carvedilol (COREG) 6.25 MG tablet Take 1 tablet (6.25 mg total) by mouth 2 (two) times daily. 06/07/22   Arnoldo Lenis, MD  acetaminophen (TYLENOL) 500 MG tablet Take 500 mg by mouth every 6 (six) hours as needed.    [provider]  aspirin EC 81 MG tablet Take 81 mg by mouth daily. Swallow whole.    [provider]  BRILINTA 90 MG TABS tablet Take 90 mg by mouth 2 (two) times daily. 09/03/21   [provider]  ezetimibe (ZETIA) 10 MG tablet Take 1 tablet by mouth once daily 03/30/22   Arnoldo Lenis, MD  losartan (COZAAR) 50 MG tablet Take 1 tablet (50 mg total) by mouth daily. 06/07/22 06/07/23  Arnoldo Lenis, MD  nitroGLYCERIN (NITROSTAT) 0.4 MG SL tablet Place 1 tablet (0.4 mg total) under the tongue every 5 (five) minutes as needed for chest pain. 01/31/17 05/28/23  Lendon Colonel, NP  pantoprazole (PROTONIX) 40 MG tablet Take 1 tablet by mouth once daily 07/28/21   Arnoldo Lenis, MD  PARoxetine (PAXIL) 20 MG tablet Take 20  mg by mouth at bedtime.    [provider]  PRALUENT 75 MG/ML SOAJ INJECT [place] TWO WEEKS Patient taking differently: Inject 75 mg into the skin every 14 (fourteen) days. 03/25/22   Arnoldo Lenis, MD    Physical Exam: Vitals:   08/07/22 2330 08/07/22 2333 08/07/22 2334 08/07/22 2336  BP: 92/64 110/69 131/69 (!) 133/55  Pulse: 63 62 60 (!) 57  Resp: '17 16 15 14  '$ Temp:      TempSrc:      SpO2: 97% 97% 96% 96%  Weight:      Height:       *** Data Reviewed: {Tip this will not be part of the note when signed- Document your independent interpretation of telemetry tracing, EKG, lab, Radiology test or any other diagnostic tests. Add any new diagnostic test ordered  today. (Optional):26781} {Results:26384}  Assessment and Plan: No notes have been filed under this hospital service. Service: Hospitalist     Advance Care Planning:   Code Status: Prior ***  Consults: ***  Family Communication: ***  Severity of Illness: {Observation/Inpatient:21159}  Author: Bernadette Hoit, DO 08/07/2022 11:45 PM  For on call review [email or web address].

## 2022-08-07 NOTE — ED Provider Notes (Signed)
[facility] EMERGENCY DEPARTMENT Provider Note   CSN: [id number] Arrival date & time: 08/07/22  2102     History  Chief Complaint  Patient presents with   Chest Pain    Kenneth [name] is a 82 y.o. male.   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  He reports that earlier this evening he was feeling a tightness and a heaviness in his chest, that seems to have eased off but is still present at about a 2 out of 10.  He has had some progressive headache this evening and when he checked his blood pressure at home it was over 073 systolic which is very abnormal for him.  His spouse places his medications in a daily box and he is good about taking them.  His son is at the bedside and confirms the story.  At this time the patient complains about the headache and chest discomfort, he is chronically short of breath and not worse than usual.  He denies any swelling of his legs and has had no fevers chills nausea or vomiting.    Home Medications Prior to Admission medications   Medication Sig Start Date End Date Taking? Authorizing Provider  carvedilol (COREG) 6.25 MG tablet Take 1 tablet (6.25 mg total) by mouth 2 (two) times daily. 06/07/22   Arnoldo Lenis, MD  acetaminophen (TYLENOL) 500 MG tablet Take 500 mg by mouth every 6 (six) hours as needed.    [provider]  aspirin EC 81 MG tablet Take 81 mg by mouth daily. Swallow whole.    [provider]  BRILINTA 90 MG TABS tablet Take 90 mg by mouth 2 (two) times daily. 09/03/21   [provider]  ezetimibe (ZETIA) 10 MG tablet Take 1 tablet by mouth once daily 03/30/22   Arnoldo Lenis, MD  losartan (COZAAR) 50 MG tablet Take 1 tablet (50 mg total) by mouth daily. 06/07/22 06/07/23   Arnoldo Lenis, MD  nitroGLYCERIN (NITROSTAT) 0.4 MG SL tablet Place 1 tablet (0.4 mg total) under the tongue every 5 (five) minutes as needed for chest pain. 01/31/17 05/28/23  Lendon Colonel, NP  pantoprazole (PROTONIX) 40 MG tablet Take 1 tablet by mouth once daily 07/28/21   Arnoldo Lenis, MD  PARoxetine (PAXIL) 20 MG tablet Take 20 mg by mouth at bedtime.    [provider]  PRALUENT 75 MG/ML SOAJ INJECT [place] TWO WEEKS Patient taking differently: Inject 75 mg into the skin every 14 (fourteen) days. 03/25/22   Arnoldo Lenis, MD      Allergies    Nifedipine, Lorazepam, Other, Penicillins, Rosuvastatin, Sulfacetamide, Sulfonamide derivatives, Erythromycin, Statins, and Vancomycin    Review of Systems   Review of Systems  Cardiovascular:  Positive for chest pain.  All other systems reviewed and are negative.   Physical Exam Updated Vital Signs BP (!) 151/74   Pulse 69   Temp 98.8 F (37.1 C) (Oral)   Resp 19   Ht 1.651 m ('5\' 5"'$ )   Wt 82.6 kg   SpO2 96%   BMI 30.30 kg/m  Physical Exam Vitals and nursing note reviewed.  Constitutional:      General: He is not in acute distress.    Appearance: He is well-developed.  HENT:     Head: Normocephalic and atraumatic.     Mouth/Throat:     Pharynx: No oropharyngeal exudate.  Eyes:     General: No scleral icterus.       Right eye: No discharge.        Left eye: No discharge.     Conjunctiva/sclera: Conjunctivae normal.     Pupils: Pupils are equal, round, and reactive to light.  Neck:     Thyroid: No thyromegaly.     Vascular: No JVD.  Cardiovascular:     Rate and Rhythm: Normal rate and regular rhythm.     Heart sounds: Normal heart sounds. No murmur heard.    No friction rub. No gallop.  Pulmonary:     Effort: Pulmonary effort is normal. No respiratory distress.     Breath sounds: Normal breath sounds. No wheezing or rales.  Abdominal:     General: Bowel sounds are normal.  There is no distension.     Palpations: Abdomen is soft. There is no mass.     Tenderness: There is no abdominal tenderness.  Musculoskeletal:        General: No tenderness. Normal range of motion.     Cervical back: Normal range of motion and neck supple.     Right lower leg: No edema.     Left lower leg: No edema.  Lymphadenopathy:     Cervical: No cervical adenopathy.  Skin:    General: Skin is warm and dry.     Findings: No erythema or rash.  Neurological:     Mental Status: He is alert.     Coordination: Coordination normal.  Psychiatric:        Behavior: Behavior normal.     ED Results / Procedures / Treatments   Labs (all labs ordered are listed, but only abnormal results are displayed) Labs Reviewed  BASIC METABOLIC PANEL - Abnormal; Notable for the following components:      Result Value   Glucose, Bld 110 (*)    Creatinine, Ser 1.45 (*)    Calcium 8.8 (*)    GFR, Estimated 48 (*)    All other components within normal limits  CBC - Abnormal; Notable for the following components:   Hemoglobin 12.7 (*)    RDW 15.7 (*)    All other components within normal limits  CBG MONITORING, ED - Abnormal; Notable for the following components:   Glucose-Capillary 126 (*)    All other components within normal limits  TROPONIN I (HIGH SENSITIVITY) - Abnormal; Notable for the following components:   Troponin I (High Sensitivity) 21 (*)    All other components within normal limits  PROTIME-INR  APTT  TROPONIN I (HIGH SENSITIVITY)    EKG EKG Interpretation  Date/Time:  Saturday August 07 2022 21:26:00 EST Ventricular Rate:  70 PR Interval:  161 QRS Duration: 130 QT Interval:  407 QTC Calculation: 440 R Axis:   10 Text Interpretation: Sinus rhythm Multiple ventricular premature complexes Right bundle branch block Inferior infarct, old since last tracing no significant change Confirmed by Noemi Chapel [phone]) on 08/07/2022 9:30:08 PM  Radiology DG Chest Portable 1  View  Result Date: 08/07/2022 CLINICAL DATA:  Chest pain EXAM: PORTABLE CHEST 1 VIEW COMPARISON:  05/30/2022 FINDINGS: Single frontal view of the chest demonstrates tracheostomy tube unchanged. Cardiac silhouette is stable. Postsurgical changes from median sternotomy. No acute airspace disease, effusion, or pneumothorax. Chronic elevation right hemidiaphragm. No acute bony abnormality. IMPRESSION: 1. Stable chest, no acute  process. Electronically Signed   By: Randa Ngo M.D.   On: 08/07/2022 22:16    Procedures .Critical Care  Performed by: Noemi Chapel, MD Authorized by: Noemi Chapel, MD   Critical care provider statement:    Critical care time (minutes):  45   Critical care time was exclusive of:  Separately billable procedures and treating other patients   Critical care was necessary to treat or prevent imminent or life-threatening deterioration of the following conditions:  Cardiac failure   Critical care was time spent personally by me on the following activities:  Development of treatment plan with patient or surrogate, discussions with consultants, evaluation of patient's response to treatment, examination of patient, obtaining history from patient or surrogate, review of old charts, re-evaluation of patient's condition, pulse oximetry, ordering and review of radiographic studies, ordering and review of laboratory studies and ordering and performing treatments and interventions   I assumed direction of critical care for this patient from another provider in my specialty: no     Care discussed with: admitting provider   Comments:           Medications Ordered in ED Medications  nitroGLYCERIN 50 mg in dextrose 5 % 250 mL (0.2 mg/mL) infusion (45 mcg/min Intravenous Rate/Dose Change 08/07/22 2251)  0.9 %  sodium chloride infusion ( Intravenous New Bag/Given 08/07/22 2203)  aspirin chewable tablet 324 mg (324 mg Oral Given 08/07/22 2157)    ED Course/ Medical Decision Making/ A&P                              Medical Decision Making Amount and/or Complexity of Data Reviewed Labs: ordered. Radiology: ordered.  Risk OTC drugs. Prescription drug management.   This patient presents to the ED for concern of chest pain and headache, this involves an extensive number of treatment options, and is a complaint that carries with it a high risk of complications and morbidity.  The differential diagnosis includes hypertensive urgency, acute coronary syndrome, less likely to be an acute neurologic problem given no neurologic symptoms however his blood pressure is severely elevated at 206/95.  He was seen recently in the ER in November for hypertension, he was treated and released.  He sees Dr. Carlyle Dolly with the cardiology service and last saw them in the office on November 2   Co morbidities that complicate the patient evaluation  History of coronary disease, hypertension   Additional history obtained:  Additional history obtained from electronic medical record External records from outside source obtained and reviewed including prior office visit from November with Dr. [name] of the cardiology service.  The patient had a bypass graft in 2003, his last heart catheterization was in 2023 in February at which time he had a STEMI and had 3 drug-eluting stents to his right coronary artery which was performed in [place].  An echocardiogram performed during that most recent admission was 45 to 50%   Lab Tests:  I Ordered, and personally interpreted labs.  The pertinent results include: Glu close of 126, creatinine of 1.45 which seems to be close to baseline, troponin is at 21 however this number is fairly close to what it has been in the past as recently as 3 months ago.  CBC shows white blood cell count of 8200 and a hemoglobin of 12.7, INR is 1.0   Imaging Studies ordered:  I ordered imaging studies including chest x-ray I independently visualized and  interpreted imaging which showed stable without any acute findings including infiltrates or pneumothorax I agree with the radiologist interpretation   Cardiac Monitoring: / EKG:  The patient was maintained on a cardiac monitor.  I personally viewed and interpreted the cardiac monitored which showed an underlying rhythm of: Normal sinus rhythm, EKG without acute ischemia   Consultations Obtained:  I requested consultation with the cardiologist, awaiting callback, at the time of change of shift pending consultation return call.  Care signed out to oncoming physician Dr. Mathis Fare to take the phone call and arrange admission for the patient   Problem List / ED Course / Critical interventions / Medication management  Patient has chest pain, on nitroglycerin drip, feeling a little bit better, blood pressure is much better at 151/74 I ordered medication including aspirin and nitroglycerin drip for chest pain and hypertensive urgency Reevaluation of the patient after these medicines showed that the patient improved but significantly symptomatic and on a nitroglycerin drip I have reviewed the patients home medicines and have made adjustments as needed   Social Determinants of Health:  Known coronary disease   Test / Admission - Considered:  Will need to be admitted to the hospital, critically ill         Final Clinical Impression(s) / ED Diagnoses Final diagnoses:  Unstable angina ([place])  Severe hypertension    Rx / DC Orders ED Discharge Orders     None         Noemi Chapel, MD 08/07/22 2259

## 2022-08-07 NOTE — H&P (Addendum)
History and Physical    Patient: Kenneth [name] XVQ:[id number] DOB: 03-12-1941 DOA: 08/07/2022 DOS: the patient was seen and examined on 08/08/2022 PCP: [name], MD  Patient coming from: Home  Chief Complaint:  Chief Complaint  Patient presents with   Chest Pain   HPI: Kenneth [name] is a 82 y.o. male with medical history significant of hypertension, hyperlipidemia, GERD, CAD s/p CABG who presents to the emergency department due to chest pain.  Patient states that his blood pressure was highly elevated at home with SBP in the 200s, this was associated with left-sided chest pain with radiation to left shoulder.  Chest pain was rated as 7/10 on pain scale, he took nitroglycerin at home without any improvement, so, wife activated EMS and patient was taken to the ED for further evaluation and management.  ED Course:  In the emergency department, BP was elevated at 184/83, respiratory rate was initially at 27/minute, BS eventually normalized, pulse 70 bpm, temperature 98.8 F, O2 sat 93% on room air.  Workup in the ED showed normocytic anemia and normal BMP except for blood glucose of 110 and creatinine of 1.45, troponin x 1 -21. Chest x-ray showed stable chest with no acute process Aspirin 324 mg Was Given, IV Hydration Was Provided and patient was started on IV nitroglycerin drip.  Cardiologist on-call (Dr. Patsey Berthold) was consulted and he agreed to management of blood pressure and the patient does not require any heparin drip unless troponin increases and the patient can be discharged if he improves and to have outpatient cardiology follow-up per ED medical record.  Hospitalist was asked to admit patient for further evaluation and management.   Review of Systems: Review of systems as noted in the HPI. All other systems reviewed and are negative.   Past Medical History:  Diagnosis Date   Acute ST elevation myocardial infarction (STEMI) of inferior wall ([place]) 03/01/2020   [facility]  [place]   Anxiety    Arthritis    Back (05/04/2018)   Carotid artery disease ([place])    a. h/o R carotid stent.   Chronic lower back pain    "have it at night" (05/04/2018)   CKD (chronic kidney disease), stage III ([place])    Coronary artery disease    s/p CABG in 2003, patent grafts by cath in 2007, s/p inferior STEMI in 02/2020 with DES to RCA, s/p NSTEMI in 05/2020 with DES to proximal-RCA, s/p NSTEMI in 11/2020 with balloon angioplasty to mid-RCA and STEMI in 08/2021 with DESx3 to RCA   CVA (cerebral vascular accident) ([place]) 10/2017   "little numb on my left face since" (05/04/2018)   Depression    GERD (gastroesophageal reflux disease)    High triglycerides    History of kidney stones    Hypertension    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   Peripheral vascular disease ([place])    Pneumonia 11/11/2021   Sinus bradycardia    Stroke [facility])    "he's had several little strokes; many that he wasn't aware of" (05/04/2018)   Tobacco abuse    Tracheal stenosis    Past Surgical History:  Procedure Laterality Date   CAROTID STENT Right 06-13-12; 05/04/2018   CAROTID STENT INSERTION N/A 06/13/2012   Procedure: CAROTID STENT INSERTION;  Surgeon: Serafina Mitchell, MD;  Location: [facility] CATH LAB;  Service: Cardiovascular;  Laterality: N/A;   CATARACT EXTRACTION, BILATERAL Bilateral    COLONOSCOPY  11/10/2011   Dr. [name]: hemorrhoids, tubular adenoma   CORNEAL TRANSPLANT  Bilateral    CORONARY ARTERY BYPASS GRAFT  ~ 2003   "CABG X3"   CORONARY BALLOON ANGIOPLASTY N/A 11/26/2020   Procedure: CORONARY BALLOON ANGIOPLASTY;  Surgeon: Troy Sine, MD;  Location: [facility] CV LAB;  Service: Cardiovascular;  Laterality: N/A;   CORONARY STENT INTERVENTION N/A 06/05/2020   Procedure: CORONARY STENT INTERVENTION;  Surgeon: Lorretta Harp, MD;  Location: [facility] CV LAB;  Service: Cardiovascular;  Laterality: N/A;   EYE SURGERY     INSERTION OF RETROGRADE CAROTID STENT Right 05/04/2018    Procedure: INSERTION OF RIGHT  CAROTID STENT using an ABBOT- XACT carotid stent system;  Surgeon: Serafina Mitchell, MD;  Location: [facility];  Service: Vascular;  Laterality: Right;   LAPAROSCOPIC CHOLECYSTECTOMY  2007   LEFT HEART CATH AND CORS/GRAFTS ANGIOGRAPHY N/A 10/07/2016   Procedure: Left Heart Cath and Cors/Grafts Angiography;  Surgeon: Troy Sine, MD;  Location: [facility] CV LAB;  Service: Cardiovascular;  Laterality: N/A;   LEFT HEART CATH AND CORS/GRAFTS ANGIOGRAPHY N/A 06/05/2020   Procedure: LEFT HEART CATH AND CORS/GRAFTS ANGIOGRAPHY;  Surgeon: Lorretta Harp, MD;  Location: [facility] CV LAB;  Service: Cardiovascular;  Laterality: N/A;   LEFT HEART CATH AND CORS/GRAFTS ANGIOGRAPHY N/A 11/26/2020   Procedure: LEFT HEART CATH AND CORS/GRAFTS ANGIOGRAPHY;  Surgeon: Troy Sine, MD;  Location: [facility] CV LAB;  Service: Cardiovascular;  Laterality: N/A;   PARTIAL LARYNGECTOMY  1998   TRACHEOSTOMY  03/13/2018   "@ [place]"    Social History:  reports that he quit smoking about 52 years ago. His smoking use included cigarettes. He started smoking about 69 years ago. He has a 15.00 pack-year smoking history. He has never been exposed to tobacco smoke. He quit smokeless tobacco use about 20 years ago.  His smokeless tobacco use included chew. He reports that he does not drink alcohol and does not use drugs.   Allergies  Allergen Reactions   Nifedipine Other (See Comments)   Lorazepam Other (See Comments)    REACTION: Alters mental status. "Turns into maniac"   Other     REACTION: Unknown to patient. States that MD states allergy per medical records   Penicillins Hives    Has patient had a PCN reaction causing immediate rash, facial/tongue/throat swelling, SOB or lightheadedness with hypotension: Yes Has patient had a PCN reaction causing severe rash involving mucus membranes or skin necrosis: No Has patient had a PCN reaction that required hospitalization No Has patient  had a PCN reaction occurring within the last 10 years: No If all of the above answers are "NO", then may proceed with Cephalosporin use.  HAS TOLERATED: cephalexin   Rosuvastatin Other (See Comments)   Sulfacetamide     REACTION: Unknown to patient. States that MD states allergy per medical records   Sulfonamide Derivatives Other (See Comments)    REACTION: Unknown to patient. States that MD states allergy per medical records   Erythromycin Rash   Statins Itching and Rash    Rash on his trunk and arms, swelling in his lips   Vancomycin Itching    Family History  Problem Relation Age of Onset   Dementia Mother    Heart disease Father    Cancer Brother    Heart disease Brother    Hyperlipidemia Brother    Cancer Brother    Colon cancer Neg Hx      Prior to Admission medications   Medication Sig Start Date End Date Taking? Authorizing Provider  carvedilol (COREG) 6.25 MG tablet Take 1 tablet (6.25 mg total) by mouth 2 (two) times daily. 06/07/22   Arnoldo Lenis, MD  acetaminophen (TYLENOL) 500 MG tablet Take 500 mg by mouth every 6 (six) hours as needed.    [provider]  aspirin EC 81 MG tablet Take 81 mg by mouth daily. Swallow whole.    [provider]  BRILINTA 90 MG TABS tablet Take 90 mg by mouth 2 (two) times daily. 09/03/21   [provider]  ezetimibe (ZETIA) 10 MG tablet Take 1 tablet by mouth once daily 03/30/22   Arnoldo Lenis, MD  losartan (COZAAR) 50 MG tablet Take 1 tablet (50 mg total) by mouth daily. 06/07/22 06/07/23  Arnoldo Lenis, MD  nitroGLYCERIN (NITROSTAT) 0.4 MG SL tablet Place 1 tablet (0.4 mg total) under the tongue every 5 (five) minutes as needed for chest pain. 01/31/17 05/28/23  Lendon Colonel, NP  pantoprazole (PROTONIX) 40 MG tablet Take 1 tablet by mouth once daily 07/28/21   Arnoldo Lenis, MD  PARoxetine (PAXIL) 20 MG tablet Take 20 mg by mouth at bedtime.    [provider]  PRALUENT 75 MG/ML  SOAJ INJECT [place] TWO WEEKS Patient taking differently: Inject 75 mg into the skin every 14 (fourteen) days. 03/25/22   Arnoldo Lenis, MD    Physical Exam: BP 112/69   Pulse 62   Temp 98.8 F (37.1 C) (Oral)   Resp 17   Ht '5\' 5"'$  (1.651 m)   Wt 82.6 kg   SpO2 95%   BMI 30.30 kg/m   General: 82 y.o. year-old male well developed well nourished in no acute distress.  Alert and oriented x3. HEENT: NCAT, EOMI Neck: Supple, tracheostomy tube noted. Cardiovascular: Regular rate and rhythm with no rubs or gallops.  No thyromegaly or JVD noted.  No lower extremity edema. 2/4 pulses in all 4 extremities. Respiratory: Clear to auscultation with no wheezes or rales. Good inspiratory effort. Abdomen: Soft, nontender nondistended with normal bowel sounds x4 quadrants. Muskuloskeletal: No cyanosis, clubbing or edema noted bilaterally Neuro: CN II-XII intact, strength 5/5 x 4, sensation, reflexes intact Skin: No ulcerative lesions noted or rashes Psychiatry: Judgement and insight appear normal. Mood is appropriate for condition and setting          Labs on Admission:  Basic Metabolic Panel: Recent Labs  Lab 08/07/22 2155  NA 139  K 3.6  CL 107  CO2 24  GLUCOSE 110*  BUN 14  CREATININE 1.45*  CALCIUM 8.8*   Liver Function Tests: No results for input(s): "AST", "ALT", "ALKPHOS", "BILITOT", "PROT", "ALBUMIN" in the last 168 hours. No results for input(s): "LIPASE", "AMYLASE" in the last 168 hours. No results for input(s): "AMMONIA" in the last 168 hours. CBC: Recent Labs  Lab 08/07/22 2155  WBC 8.2  HGB 12.7*  HCT 39.2  MCV 91.2  PLT 207   Cardiac Enzymes: No results for input(s): "CKTOTAL", "CKMB", "CKMBINDEX", "TROPONINI" in the last 168 hours.  BNP (last 3 results) Recent Labs    05/30/22 1224  BNP 196.0*    ProBNP (last 3 results) No results for input(s): "PROBNP" in the last 8760 hours.  CBG: Recent Labs  Lab 08/07/22 2218  GLUCAP  126*    Radiological Exams on Admission: DG Chest Portable 1 View  Result Date: 08/07/2022 CLINICAL DATA:  Chest pain EXAM: PORTABLE CHEST 1 VIEW COMPARISON:  05/30/2022 FINDINGS: Single frontal view of the chest  demonstrates tracheostomy tube unchanged. Cardiac silhouette is stable. Postsurgical changes from median sternotomy. No acute airspace disease, effusion, or pneumothorax. Chronic elevation right hemidiaphragm. No acute bony abnormality. IMPRESSION: 1. Stable chest, no acute process. Electronically Signed   By: Randa Ngo M.D.   On: 08/07/2022 22:16    EKG: I independently viewed the EKG done and my findings are as followed: Normal sinus rhythm at a rate of 70 bpm with multiple PVCs  Assessment/Plan Present on Admission:  Hypertensive urgency  Essential hypertension  Chronic kidney disease, stage 3a ([place])  Mixed hyperlipidemia  GERD (gastroesophageal reflux disease)  Principal Problem:   Hypertensive urgency Active Problems:   Mixed hyperlipidemia   CAD S/P percutaneous coronary angioplasty   Essential hypertension   Chronic kidney disease, stage 3a ([facility])   GERD (gastroesophageal reflux disease)   Depression   Hypertensive Urgency-resolved Essential hypertension  Elevated troponin possible secondary to type II demand ischemia Troponin x 1 - 21; continue to trend troponin  Chronic kidney disease stage IIIa Creatinine (1.45) and eGFR (48) are within baseline range Renally adjust medications, avoid nephrotoxic agents/dehydration/hypotension  Mixed hyperlipidemia Continue Zetia  GERD Continue Protonix  CAD s/p CABG x 3 Continue aspirin, Brilinta, Zetia  Depression Continue Paxil  Goals of care: Palliative care will be consulted  DVT prophylaxis: Lovenox  Code Status: Full code  Family Communication: None at bedside  Consults: None  Severity of Illness: The appropriate patient status for this patient is OBSERVATION. Observation status is judged to  be reasonable and necessary in order to provide the required intensity of service to ensure the patient's safety. The patient's presenting symptoms, physical exam findings, and initial radiographic and laboratory data in the context of their medical condition is felt to place them at decreased risk for further clinical deterioration. Furthermore, it is anticipated that the patient will be medically stable for discharge from the hospital within 2 midnights of admission.   Author: Bernadette Hoit, DO 08/08/2022 12:41 AM  For on call review [email or web address].

## 2022-08-07 NOTE — ED Triage Notes (Addendum)
Pt reports chest pain, hypertension, left shoulder pain, and headache that started today. Dr Sabra Heck at bedside evaluating pt during triage. Took one nitro at home, says he has taken night time meds

## 2022-08-07 NOTE — ED Provider Notes (Signed)
Patient signed out to me by Dr. Sabra Heck.  Patient seen with chest pain in the setting of prior history of CAD and stenting.  Patient without changes on the EKG, troponin is slightly elevated at 21.  Patient very hypertensive at arrival.  He was treated with nitroglycerin and blood pressure has improved.  Patient symptomatically is improved.  Discussed with Dr. Patsey Berthold, on-call for cardiology.  Agrees with current management, blood pressure control.  Does not require heparin unless troponins increase.  If he does well, follow-up with cardiology in the outpatient setting for stress test.   Orpah Greek, MD 08/07/22 [phone]

## 2022-08-08 DIAGNOSIS — F32A Depression, unspecified: Secondary | ICD-10-CM | POA: Insufficient documentation

## 2022-08-08 DIAGNOSIS — I169 Hypertensive crisis, unspecified: Secondary | ICD-10-CM

## 2022-08-08 DIAGNOSIS — I160 Hypertensive urgency: Secondary | ICD-10-CM | POA: Diagnosis not present

## 2022-08-08 HISTORY — DX: Hypertensive crisis, unspecified: I16.9

## 2022-08-08 LAB — COMPREHENSIVE METABOLIC PANEL
ALT: 18 U/L (ref 0–44)
AST: 22 U/L (ref 15–41)
Albumin: 3.1 g/dL — ABNORMAL LOW (ref 3.5–5.0)
Alkaline Phosphatase: 95 U/L (ref 38–126)
Anion gap: 7 (ref 5–15)
BUN: 16 mg/dL (ref 8–23)
CO2: 24 mmol/L (ref 22–32)
Calcium: 8.4 mg/dL — ABNORMAL LOW (ref 8.9–10.3)
Chloride: 108 mmol/L (ref 98–111)
Creatinine, Ser: 1.4 mg/dL — ABNORMAL HIGH (ref 0.61–1.24)
GFR, Estimated: 50 mL/min — ABNORMAL LOW (ref >60)
Glucose, Bld: 124 mg/dL — ABNORMAL HIGH (ref 70–99)
Potassium: 3.7 mmol/L (ref 3.5–5.1)
Sodium: 139 mmol/L (ref 135–145)
Total Bilirubin: 0.3 mg/dL (ref 0.3–1.2)
Total Protein: 5.9 g/dL — ABNORMAL LOW (ref 6.5–8.1)

## 2022-08-08 LAB — TROPONIN I (HIGH SENSITIVITY)
Troponin I (High Sensitivity): 23 ng/L — ABNORMAL HIGH (ref <18)
Troponin I (High Sensitivity): 19 ng/L — ABNORMAL HIGH (ref <18)
Troponin I (High Sensitivity): 18 ng/L — ABNORMAL HIGH (ref <18)

## 2022-08-08 LAB — CBC
HCT: 34.5 % — ABNORMAL LOW (ref 39.0–52.0)
Hemoglobin: 11.1 g/dL — ABNORMAL LOW (ref 13.0–17.0)
MCH: 29.2 pg (ref 26.0–34.0)
MCHC: 32.2 g/dL (ref 30.0–36.0)
MCV: 90.8 fL (ref 80.0–100.0)
Platelets: 188 10*3/uL (ref 150–400)
RBC: 3.8 MIL/uL — ABNORMAL LOW (ref 4.22–5.81)
RDW: 15.6 % — ABNORMAL HIGH (ref 11.5–15.5)
WBC: 9 10*3/uL (ref 4.0–10.5)
nRBC: 0 % (ref 0.0–0.2)

## 2022-08-08 MED ORDER — ACETAMINOPHEN 650 MG RE SUPP: 650.0000 mg | Freq: Four times a day (QID) | RECTAL | Status: DC | PRN

## 2022-08-08 MED ORDER — LOSARTAN POTASSIUM 25 MG PO TABS
50.0000 mg | ORAL_TABLET | Freq: Every day | ORAL | Status: DC
  Administered 2022-08-08: 50 mg via ORAL
  Filled 2022-08-08: qty 2

## 2022-08-08 MED ORDER — CARVEDILOL 3.125 MG PO TABS: 6.2500 mg | ORAL_TABLET | Freq: Two times a day (BID) | ORAL | Status: DC

## 2022-08-08 MED ORDER — HYDRALAZINE HCL 20 MG/ML IJ SOLN
10.0000 mg | INTRAMUSCULAR | Status: DC | PRN
  Administered 2022-08-08: 10 mg via INTRAVENOUS
  Filled 2022-08-08: qty 1

## 2022-08-08 MED ORDER — LOSARTAN POTASSIUM 100 MG PO TABS: 100.0000 mg | ORAL_TABLET | Freq: Every day | ORAL | 3 refills | Status: DC

## 2022-08-08 MED ORDER — ONDANSETRON HCL 4 MG/2ML IJ SOLN: 4.0000 mg | Freq: Four times a day (QID) | INTRAMUSCULAR | Status: DC | PRN

## 2022-08-08 MED ORDER — TICAGRELOR 90 MG PO TABS
90.0000 mg | ORAL_TABLET | Freq: Two times a day (BID) | ORAL | Status: DC
  Administered 2022-08-08 – 2022-08-09 (×3): 90 mg via ORAL
  Filled 2022-08-08 (×3): qty 1

## 2022-08-08 MED ORDER — PANTOPRAZOLE SODIUM 40 MG PO TBEC
40.0000 mg | DELAYED_RELEASE_TABLET | Freq: Every day | ORAL | Status: DC
  Administered 2022-08-08 – 2022-08-10 (×3): 40 mg via ORAL
  Filled 2022-08-08 (×3): qty 1

## 2022-08-08 MED ORDER — CARVEDILOL 3.125 MG PO TABS
6.2500 mg | ORAL_TABLET | Freq: Two times a day (BID) | ORAL | Status: DC
  Administered 2022-08-08 – 2022-08-10 (×5): 6.25 mg via ORAL
  Filled 2022-08-08 (×5): qty 2

## 2022-08-08 MED ORDER — LOSARTAN POTASSIUM 25 MG PO TABS: 50.0000 mg | ORAL_TABLET | Freq: Every day | ORAL | Status: DC

## 2022-08-08 MED ORDER — EZETIMIBE 10 MG PO TABS
10.0000 mg | ORAL_TABLET | Freq: Every day | ORAL | Status: DC
  Administered 2022-08-08 – 2022-08-10 (×3): 10 mg via ORAL
  Filled 2022-08-08 (×3): qty 1

## 2022-08-08 MED ORDER — ONDANSETRON HCL 4 MG PO TABS: 4.0000 mg | ORAL_TABLET | Freq: Four times a day (QID) | ORAL | Status: DC | PRN

## 2022-08-08 MED ORDER — ACETAMINOPHEN 325 MG PO TABS
650.0000 mg | ORAL_TABLET | Freq: Four times a day (QID) | ORAL | Status: DC | PRN
  Administered 2022-08-08 – 2022-08-09 (×3): 650 mg via ORAL
  Filled 2022-08-08 (×3): qty 2

## 2022-08-08 MED ORDER — PAROXETINE HCL 20 MG PO TABS
20.0000 mg | ORAL_TABLET | Freq: Every day | ORAL | Status: DC
  Administered 2022-08-08 – 2022-08-10 (×3): 20 mg via ORAL
  Filled 2022-08-08 (×3): qty 1

## 2022-08-08 MED ORDER — OXYCODONE HCL 5 MG PO TABS
5.0000 mg | ORAL_TABLET | ORAL | Status: DC | PRN
  Administered 2022-08-08: 5 mg via ORAL
  Filled 2022-08-08: qty 1

## 2022-08-08 MED ORDER — ENOXAPARIN SODIUM 40 MG/0.4ML IJ SOSY
40.0000 mg | PREFILLED_SYRINGE | INTRAMUSCULAR | Status: DC
  Administered 2022-08-08 – 2022-08-09 (×2): 40 mg via SUBCUTANEOUS
  Filled 2022-08-08 (×2): qty 0.4

## 2022-08-08 MED ORDER — ASPIRIN 81 MG PO TBEC
81.0000 mg | DELAYED_RELEASE_TABLET | Freq: Every day | ORAL | Status: DC
  Administered 2022-08-08 – 2022-08-09 (×2): 81 mg via ORAL
  Filled 2022-08-08 (×2): qty 1

## 2022-08-08 MED ORDER — HYDROMORPHONE HCL 1 MG/ML IJ SOLN: 0.5000 mg | INTRAMUSCULAR | Status: DC | PRN

## 2022-08-08 NOTE — ED Notes (Addendum)
Pt son came to nurses station, stating "he has been here for 18 hours, should we just transfer him to [facility]", questioning why BP is still elevated. This RN explained that hydralazine was given 20 minutes ago and explained the purpose of the nitro drip.   Pt also endorsing chills. Oral temp 98.6.   Family asked to return to room.  Manuella Ghazi, DO made aware via secure chat.

## 2022-08-08 NOTE — ED Notes (Signed)
Hospitalist at bedside. Give home medications and attempt to ween drip.

## 2022-08-08 NOTE — ED Notes (Signed)
Pharmacy called to reschedule 1000 medications to now.

## 2022-08-08 NOTE — TOC Progression Note (Signed)
Transition of Care [facility]) - Progression Note    Patient Details  Name: KENDRICK HAAPALA MRN: [id number] Date of Birth: 1941/02/03  Transition of Care [facility]) CM/SW Contact  Salome Arnt, [place] Phone Number: 08/08/2022, 11:26 AM  Clinical Narrative:   Transition of Care [facility]) Screening Note   Patient Details  Name: LAYTON NAVES Date of Birth: 01-21-1941   Transition of Care [facility]) CM/SW Contact:    Salome Arnt, [place] Phone Number: 08/08/2022, 11:26 AM    Transition of Care Department [facility]) has reviewed patient and no TOC needs have been identified at this time. We will continue to monitor patient advancement through interdisciplinary progression rounds. If new patient transition needs arise, please place a TOC consult.            Expected Discharge Plan and Services         Expected Discharge Date: 08/08/22                                     Social Determinants of Health (SDOH) Interventions SDOH Screenings   Tobacco Use: Medium Risk (08/07/2022)    Readmission Risk Interventions     No data to display

## 2022-08-08 NOTE — Discharge Summary (Addendum)
Physician Discharge Summary  Kenneth [name] WUJ:[id number] DOB: 09-05-1940 DOA: 08/07/2022  PCP: Kenneth Noble, MD  Admit date: 08/07/2022  Discharge date: 08/09/2022  Admitted From:Home  Disposition:  Home  Recommendations for Outpatient Follow-up:  Follow up with PCP in 1-2 weeks Continue on home medications of Coreg and losartan as previously and add back amlodipine 10 mg daily as well as spironolactone 25 mg daily; somehow he was not taking these medications. Encouraged to monitor blood pressures closely Follow-up with cardiology outpatient for stress test with referral sent to cardiology office in [place], Dr. [name]  Home Health: None  Equipment/Devices: None  Discharge Condition:Stable  CODE STATUS: Full  Diet recommendation: Heart Healthy  Brief/Interim Summary: Per HPI: Kenneth [name] is a 82 y.o. male with medical history significant of hypertension, hyperlipidemia, GERD, CAD s/p CABG who presents to the emergency department due to chest pain.  Patient states that his blood pressure was highly elevated at home with SBP in the 200s, this was associated with left-sided chest pain with radiation to left shoulder.  Chest pain was rated as 7/10 on pain scale, he took nitroglycerin at home without any improvement, so, wife activated EMS and patient was taken to the ED for further evaluation and management.   Patient was admitted for hypertensive urgency and had associated chest pain as well as elevated troponin that is likely demand ischemia.  He was started on nitroglycerin drip.  He appears to be compliant with his home medications, but it appears that he was not taking his amlodipine and spironolactone as his wife did not have those medications with her upon medication reconciliation here.  She claims that there were no additional medications that she left behind.  He is able to wean off of his nitroglycerin at this time and denies any further chest pain or other concerns and  is stable for discharge.  Per cardiology recommendations, follow-up within the next 1-2 weeks outpatient for outpatient stress test.  Discharge Diagnoses:  Principal Problem:   Hypertensive urgency Active Problems:   Mixed hyperlipidemia   CAD S/P percutaneous coronary angioplasty   Essential hypertension   Chronic kidney disease, stage 3a ([place])   GERD (gastroesophageal reflux disease)   Depression   Hypertensive crisis  Principal discharge diagnosis: Chest pain with elevated troponin likely secondary to demand ischemia in the setting of hypertensive urgency.  Discharge Instructions  Discharge Instructions     Ambulatory referral to Cardiology   Complete by: As directed    Diet - low sodium heart healthy   Complete by: As directed    Increase activity slowly   Complete by: As directed       Allergies as of 08/09/2022       Reactions   Nifedipine Other (See Comments)   Lorazepam Other (See Comments)   REACTION: Alters mental status. "Turns into maniac"   Other    REACTION: Unknown to patient. States that MD states allergy per medical records   Penicillins Hives   Has patient had a PCN reaction causing immediate rash, facial/tongue/throat swelling, SOB or lightheadedness with hypotension: Yes Has patient had a PCN reaction causing severe rash involving mucus membranes or skin necrosis: No Has patient had a PCN reaction that required hospitalization No Has patient had a PCN reaction occurring within the last 10 years: No If all of the above answers are "NO", then may proceed with Cephalosporin use. HAS TOLERATED: cephalexin   Rosuvastatin Other (See Comments)   Sulfacetamide    REACTION:  Unknown to patient. States that MD states allergy per medical records   Sulfonamide Derivatives Other (See Comments)   REACTION: Unknown to patient. States that MD states allergy per medical records   Erythromycin Rash   Statins Itching, Rash   Rash on his trunk and arms, swelling in  his lips   Vancomycin Itching        Medication List     TAKE these medications    acetaminophen 500 MG tablet Commonly known as: TYLENOL Take 500 mg by mouth every 6 (six) hours as needed.   amLODipine 10 MG tablet Commonly known as: NORVASC Take 1 tablet (10 mg total) by mouth daily.   aspirin EC 81 MG tablet Take 81 mg by mouth daily. Swallow whole.   Brilinta 90 MG Tabs tablet Generic drug: ticagrelor Take 90 mg by mouth 2 (two) times daily.   carvedilol 6.25 MG tablet Commonly known as: COREG Take 1 tablet (6.25 mg total) by mouth 2 (two) times daily.   ezetimibe 10 MG tablet Commonly known as: ZETIA Take 1 tablet by mouth once daily   losartan 50 MG tablet Commonly known as: COZAAR Take 1 tablet (50 mg total) by mouth daily.   nitroGLYCERIN 0.4 MG SL tablet Commonly known as: Nitrostat Place 1 tablet (0.4 mg total) under the tongue every 5 (five) minutes as needed for chest pain.   pantoprazole 40 MG tablet Commonly known as: PROTONIX Take 1 tablet by mouth once daily   PARoxetine 20 MG tablet Commonly known as: PAXIL Take 20 mg by mouth daily.   Praluent 75 MG/ML [place] Generic drug: Alirocumab INJECT 1 PEN SUBCUTANEOUSLY EVERY TWO WEEKS What changed: See the new instructions.   spironolactone 25 MG tablet Commonly known as: ALDACTONE Take 1 tablet (25 mg total) by mouth daily.        Follow-up Information     Kenneth Noble, MD. Schedule an appointment as soon as possible for a visit in 1 week(s).   Specialty: Internal Medicine Contact information: [address] [place] [address] [phone]                Allergies  Allergen Reactions   Nifedipine Other (See Comments)   Lorazepam Other (See Comments)    REACTION: Alters mental status. "Turns into maniac"   Other     REACTION: Unknown to patient. States that MD states allergy per medical records   Penicillins Hives    Has patient had a PCN reaction causing  immediate rash, facial/tongue/throat swelling, SOB or lightheadedness with hypotension: Yes Has patient had a PCN reaction causing severe rash involving mucus membranes or skin necrosis: No Has patient had a PCN reaction that required hospitalization No Has patient had a PCN reaction occurring within the last 10 years: No If all of the above answers are "NO", then may proceed with Cephalosporin use.  HAS TOLERATED: cephalexin   Rosuvastatin Other (See Comments)   Sulfacetamide     REACTION: Unknown to patient. States that MD states allergy per medical records   Sulfonamide Derivatives Other (See Comments)    REACTION: Unknown to patient. States that MD states allergy per medical records   Erythromycin Rash   Statins Itching and Rash    Rash on his trunk and arms, swelling in his lips   Vancomycin Itching    Consultations: None   Procedures/Studies: DG Chest Portable 1 View  Result Date: 08/07/2022 CLINICAL DATA:  Chest pain EXAM: PORTABLE CHEST 1 VIEW COMPARISON:  05/30/2022  FINDINGS: Single frontal view of the chest demonstrates tracheostomy tube unchanged. Cardiac silhouette is stable. Postsurgical changes from median sternotomy. No acute airspace disease, effusion, or pneumothorax. Chronic elevation right hemidiaphragm. No acute bony abnormality. IMPRESSION: 1. Stable chest, no acute process. Electronically Signed   By: Randa Ngo M.D.   On: 08/07/2022 22:16     Discharge Exam: Vitals:   08/09/22 0600 08/09/22 0651  BP: 136/85   Pulse: 79 81  Resp: (!) 25 (!) 21  Temp:    SpO2: 93% 94%   Vitals:   08/09/22 0500 08/09/22 0530 08/09/22 0600 08/09/22 0651  BP: (!) 142/79 138/71 136/85   Pulse: 78 87 79 81  Resp: (!) 26 (!) 27 (!) 25 (!) 21  Temp:      TempSrc:      SpO2: 93% 92% 93% 94%  Weight:      Height:        General: Pt is alert, awake, not in acute distress Cardiovascular: RRR, S1/S2 +, no rubs, no gallops Respiratory: CTA bilaterally, no wheezing, no  rhonchi Abdominal: Soft, NT, ND, bowel sounds + Extremities: no edema, no cyanosis    The results of significant diagnostics from this hospitalization (including imaging, microbiology, ancillary and laboratory) are listed below for reference.     Microbiology: No results found for this or any previous visit (from the past 240 hour(s)).   Labs: BNP (last 3 results) Recent Labs    05/30/22 1224  BNP 078.6*   Basic Metabolic Panel: Recent Labs  Lab 08/07/22 2155 08/08/22 0317  NA 139 139  K 3.6 3.7  CL 107 108  CO2 24 24  GLUCOSE 110* 124*  BUN 14 16  CREATININE 1.45* 1.40*  CALCIUM 8.8* 8.4*   Liver Function Tests: Recent Labs  Lab 08/08/22 0317  AST 22  ALT 18  ALKPHOS 95  BILITOT 0.3  PROT 5.9*  ALBUMIN 3.1*   No results for input(s): "LIPASE", "AMYLASE" in the last 168 hours. No results for input(s): "AMMONIA" in the last 168 hours. CBC: Recent Labs  Lab 08/07/22 2155 08/08/22 0317  WBC 8.2 9.0  HGB 12.7* 11.1*  HCT 39.2 34.5*  MCV 91.2 90.8  PLT 207 188   Cardiac Enzymes: No results for input(s): "CKTOTAL", "CKMB", "CKMBINDEX", "TROPONINI" in the last 168 hours. BNP: Invalid input(s): "POCBNP" CBG: Recent Labs  Lab 08/07/22 2218  GLUCAP 126*   D-Dimer No results for input(s): "DDIMER" in the last 72 hours. Hgb A1c No results for input(s): "HGBA1C" in the last 72 hours. Lipid Profile No results for input(s): "CHOL", "HDL", "LDLCALC", "TRIG", "CHOLHDL", "LDLDIRECT" in the last 72 hours. Thyroid function studies No results for input(s): "TSH", "T4TOTAL", "T3FREE", "THYROIDAB" in the last 72 hours.  Invalid input(s): "FREET3" Anemia work up No results for input(s): "VITAMINB12", "FOLATE", "FERRITIN", "TIBC", "IRON", "RETICCTPCT" in the last 72 hours. Urinalysis    Component Value Date/Time   COLORURINE STRAW (A) 11/14/2021 2121   APPEARANCEUR CLEAR 11/14/2021 2121   LABSPEC 1.005 11/14/2021 2121   PHURINE 6.0 11/14/2021 2121    GLUCOSEU NEGATIVE 11/14/2021 2121   GLUCOSEU neg 06/01/2010 1113   HGBUR NEGATIVE 11/14/2021 2121   BILIRUBINUR NEGATIVE 11/14/2021 2121   KETONESUR NEGATIVE 11/14/2021 2121   PROTEINUR NEGATIVE 11/14/2021 2121   NITRITE NEGATIVE 11/14/2021 2121   LEUKOCYTESUR NEGATIVE 11/14/2021 2121   Sepsis Labs Recent Labs  Lab 08/07/22 2155 08/08/22 0317  WBC 8.2 9.0   Microbiology No results found for this or any previous  visit (from the past 240 hour(s)).   Time coordinating discharge: 35 minutes  SIGNED:   Rodena Goldmann, DO [facility] 08/09/2022, 7:54 AM  If 7PM-7AM, please contact night-coverage [email or web address]

## 2022-08-08 NOTE — ED Notes (Signed)
Dr Manuella Ghazi notified of pt BP and nitro drip rate.

## 2022-08-09 ENCOUNTER — Other Ambulatory Visit (HOSPITAL_COMMUNITY): Payer: Self-pay | Admitting: *Deleted

## 2022-08-09 ENCOUNTER — Inpatient Hospital Stay (HOSPITAL_COMMUNITY): Payer: Medicare HMO

## 2022-08-09 ENCOUNTER — Encounter (HOSPITAL_COMMUNITY): Payer: Self-pay | Admitting: Internal Medicine

## 2022-08-09 DIAGNOSIS — I4891 Unspecified atrial fibrillation: Secondary | ICD-10-CM | POA: Diagnosis present

## 2022-08-09 DIAGNOSIS — I10 Essential (primary) hypertension: Secondary | ICD-10-CM | POA: Diagnosis not present

## 2022-08-09 DIAGNOSIS — Z930 Tracheostomy status: Secondary | ICD-10-CM | POA: Diagnosis not present

## 2022-08-09 DIAGNOSIS — E782 Mixed hyperlipidemia: Secondary | ICD-10-CM | POA: Diagnosis present

## 2022-08-09 DIAGNOSIS — Z947 Corneal transplant status: Secondary | ICD-10-CM | POA: Diagnosis not present

## 2022-08-09 DIAGNOSIS — I130 Hypertensive heart and chronic kidney disease with heart failure and stage 1 through stage 4 chronic kidney disease, or unspecified chronic kidney disease: Secondary | ICD-10-CM | POA: Diagnosis present

## 2022-08-09 DIAGNOSIS — I160 Hypertensive urgency: Secondary | ICD-10-CM

## 2022-08-09 DIAGNOSIS — I739 Peripheral vascular disease, unspecified: Secondary | ICD-10-CM | POA: Diagnosis present

## 2022-08-09 DIAGNOSIS — R0789 Other chest pain: Secondary | ICD-10-CM | POA: Diagnosis present

## 2022-08-09 DIAGNOSIS — I25810 Atherosclerosis of coronary artery bypass graft(s) without angina pectoris: Secondary | ICD-10-CM | POA: Diagnosis not present

## 2022-08-09 DIAGNOSIS — I4510 Unspecified right bundle-branch block: Secondary | ICD-10-CM | POA: Diagnosis present

## 2022-08-09 DIAGNOSIS — D631 Anemia in chronic kidney disease: Secondary | ICD-10-CM | POA: Diagnosis present

## 2022-08-09 DIAGNOSIS — Z7189 Other specified counseling: Secondary | ICD-10-CM

## 2022-08-09 DIAGNOSIS — I6523 Occlusion and stenosis of bilateral carotid arteries: Secondary | ICD-10-CM

## 2022-08-09 DIAGNOSIS — Z8249 Family history of ischemic heart disease and other diseases of the circulatory system: Secondary | ICD-10-CM | POA: Diagnosis not present

## 2022-08-09 DIAGNOSIS — Z87442 Personal history of urinary calculi: Secondary | ICD-10-CM | POA: Diagnosis not present

## 2022-08-09 DIAGNOSIS — I2489 Other forms of acute ischemic heart disease: Secondary | ICD-10-CM | POA: Diagnosis present

## 2022-08-09 DIAGNOSIS — Z720 Tobacco use: Secondary | ICD-10-CM

## 2022-08-09 DIAGNOSIS — Z515 Encounter for palliative care: Secondary | ICD-10-CM

## 2022-08-09 DIAGNOSIS — Z87891 Personal history of nicotine dependence: Secondary | ICD-10-CM | POA: Diagnosis not present

## 2022-08-09 DIAGNOSIS — I255 Ischemic cardiomyopathy: Secondary | ICD-10-CM | POA: Diagnosis present

## 2022-08-09 DIAGNOSIS — Z951 Presence of aortocoronary bypass graft: Secondary | ICD-10-CM | POA: Diagnosis not present

## 2022-08-09 DIAGNOSIS — I252 Old myocardial infarction: Secondary | ICD-10-CM | POA: Diagnosis not present

## 2022-08-09 DIAGNOSIS — N1831 Chronic kidney disease, stage 3a: Secondary | ICD-10-CM | POA: Diagnosis present

## 2022-08-09 DIAGNOSIS — F32A Depression, unspecified: Secondary | ICD-10-CM | POA: Diagnosis present

## 2022-08-09 DIAGNOSIS — I2510 Atherosclerotic heart disease of native coronary artery without angina pectoris: Secondary | ICD-10-CM | POA: Diagnosis present

## 2022-08-09 DIAGNOSIS — Z8673 Personal history of transient ischemic attack (TIA), and cerebral infarction without residual deficits: Secondary | ICD-10-CM | POA: Diagnosis not present

## 2022-08-09 DIAGNOSIS — Z83438 Family history of other disorder of lipoprotein metabolism and other lipidemia: Secondary | ICD-10-CM | POA: Diagnosis not present

## 2022-08-09 DIAGNOSIS — K219 Gastro-esophageal reflux disease without esophagitis: Secondary | ICD-10-CM | POA: Diagnosis present

## 2022-08-09 DIAGNOSIS — Z8521 Personal history of malignant neoplasm of larynx: Secondary | ICD-10-CM | POA: Diagnosis not present

## 2022-08-09 DIAGNOSIS — I5022 Chronic systolic (congestive) heart failure: Secondary | ICD-10-CM | POA: Diagnosis present

## 2022-08-09 LAB — ECHOCARDIOGRAM COMPLETE
Area-P 1/2: 4.77 cm2
Height: 65 in
S' Lateral: 3.3 cm
Weight: 2913.6 oz

## 2022-08-09 LAB — BASIC METABOLIC PANEL
Anion gap: 13 (ref 5–15)
BUN: 24 mg/dL — ABNORMAL HIGH (ref 8–23)
CO2: 20 mmol/L — ABNORMAL LOW (ref 22–32)
Calcium: 8.2 mg/dL — ABNORMAL LOW (ref 8.9–10.3)
Chloride: 103 mmol/L (ref 98–111)
Creatinine, Ser: 1.59 mg/dL — ABNORMAL HIGH (ref 0.61–1.24)
GFR, Estimated: 43 mL/min — ABNORMAL LOW (ref >60)
Glucose, Bld: 103 mg/dL — ABNORMAL HIGH (ref 70–99)
Potassium: 3.7 mmol/L (ref 3.5–5.1)
Sodium: 136 mmol/L (ref 135–145)

## 2022-08-09 LAB — TSH: TSH: 2.392 u[IU]/mL (ref 0.350–4.500)

## 2022-08-09 LAB — MAGNESIUM: Magnesium: 1.9 mg/dL (ref 1.7–2.4)

## 2022-08-09 LAB — MRSA NEXT GEN BY PCR, NASAL: MRSA by PCR Next Gen: NOT DETECTED

## 2022-08-09 MED ORDER — LOSARTAN POTASSIUM 50 MG PO TABS
50.0000 mg | ORAL_TABLET | Freq: Every day | ORAL | Status: DC
  Administered 2022-08-09 – 2022-08-10 (×2): 50 mg via ORAL
  Filled 2022-08-09: qty 1
  Filled 2022-08-09: qty 2

## 2022-08-09 MED ORDER — PERFLUTREN LIPID MICROSPHERE
1.0000 mL | INTRAVENOUS | Status: AC | PRN
  Administered 2022-08-09: 2 mL via INTRAVENOUS

## 2022-08-09 MED ORDER — ENOXAPARIN SODIUM 80 MG/0.8ML IJ SOSY: 80.0000 mg | PREFILLED_SYRINGE | Freq: Two times a day (BID) | INTRAMUSCULAR | Status: DC

## 2022-08-09 MED ORDER — LOSARTAN POTASSIUM 50 MG PO TABS: 50.0000 mg | ORAL_TABLET | Freq: Every day | ORAL | 1 refills | Status: DC

## 2022-08-09 MED ORDER — APIXABAN 2.5 MG PO TABS
2.5000 mg | ORAL_TABLET | Freq: Two times a day (BID) | ORAL | Status: DC
  Administered 2022-08-09 – 2022-08-10 (×4): 2.5 mg via ORAL
  Filled 2022-08-09 (×4): qty 1

## 2022-08-09 MED ORDER — CLOPIDOGREL BISULFATE 75 MG PO TABS
75.0000 mg | ORAL_TABLET | Freq: Every day | ORAL | Status: DC
  Administered 2022-08-09 – 2022-08-10 (×2): 75 mg via ORAL
  Filled 2022-08-09 (×2): qty 1

## 2022-08-09 MED ORDER — AMLODIPINE BESYLATE 10 MG PO TABS: 10.0000 mg | ORAL_TABLET | Freq: Every day | ORAL | 1 refills | Status: DC

## 2022-08-09 MED ORDER — SPIRONOLACTONE 25 MG PO TABS: 25.0000 mg | ORAL_TABLET | Freq: Every day | ORAL | 1 refills | Status: DC

## 2022-08-09 MED ORDER — ENOXAPARIN SODIUM 40 MG/0.4ML IJ SOSY: 40.0000 mg | PREFILLED_SYRINGE | Freq: Once | INTRAMUSCULAR | Status: DC

## 2022-08-09 MED ORDER — ISOSORBIDE MONONITRATE ER 30 MG PO TB24
30.0000 mg | ORAL_TABLET | Freq: Every day | ORAL | Status: DC
  Administered 2022-08-09 – 2022-08-10 (×2): 30 mg via ORAL
  Filled 2022-08-09 (×2): qty 1

## 2022-08-09 MED ORDER — AMLODIPINE BESYLATE 5 MG PO TABS
10.0000 mg | ORAL_TABLET | Freq: Every day | ORAL | Status: DC
  Administered 2022-08-09: 10 mg via ORAL
  Filled 2022-08-09: qty 2

## 2022-08-09 MED ORDER — SPIRONOLACTONE 25 MG PO TABS
25.0000 mg | ORAL_TABLET | Freq: Every day | ORAL | Status: DC
  Administered 2022-08-09 – 2022-08-10 (×2): 25 mg via ORAL
  Filled 2022-08-09 (×2): qty 1

## 2022-08-09 MED ORDER — CHLORHEXIDINE GLUCONATE CLOTH 2 % EX PADS
6.0000 | MEDICATED_PAD | Freq: Every day | CUTANEOUS | Status: DC
  Administered 2022-08-09 – 2022-08-10 (×2): 6 via TOPICAL

## 2022-08-09 MED ORDER — DILTIAZEM LOAD VIA INFUSION
10.0000 mg | Freq: Once | INTRAVENOUS | Status: AC
  Administered 2022-08-09: 10 mg via INTRAVENOUS
  Filled 2022-08-09: qty 10

## 2022-08-09 MED ORDER — DILTIAZEM HCL-DEXTROSE 125-5 MG/125ML-% IV SOLN (PREMIX)
5.0000 mg/h | INTRAVENOUS | Status: DC
  Administered 2022-08-09 – 2022-08-10 (×2): 5 mg/h via INTRAVENOUS
  Filled 2022-08-09 (×2): qty 125

## 2022-08-09 NOTE — ED Notes (Signed)
Pt sitting on the side of the bed, son at the bedside.

## 2022-08-09 NOTE — Consult Note (Signed)
Consultation Note Date: 08/09/2022   Patient Name: Kenneth [name]  DOB: 05-21-1941  MRN: [id number]  Age / Sex: 82 y.o., male  PCP: [name], MD Referring Physician: Rodena Goldmann, DO  Reason for Consultation: Establishing goals of care  HPI/Patient Profile: 82 y.o. male  with past medical history of CAD, ICM, chronic heart failure, HTN/HLD, PAD, carotid disease with stenting, CKD 3, CVA x 3, remote tobacco abuse with laryngeal cancer status post trach admitted on 08/07/2022 with A-fib with RVR and chest pain.   Clinical Assessment and Goals of Care: I have reviewed medical records including EPIC notes, labs and imaging, received report from RN, assessed the patient.  Kenneth [name] is lying quietly on the stretcher in the ED.  He greets me, making and mostly keeping eye contact.  Overall, he looks stable.  He is alert and oriented x 3, able to make his needs known.  There is no family at bedside at this time.  We meet at the bedside to discuss diagnosis prognosis, GOC, EOL wishes, disposition and options. I introduced Palliative Medicine as specialized medical care for people living with serious illness. It focuses on providing relief from the symptoms and stress of a serious illness. The goal is to improve quality of life for both the patient and the family.  We discussed a brief life review of the patient.  [name] shares that he lives with his wife.  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  He is able to speak when he manually closes the stoma.  He tells me that he has 2 children.  He shares that he had been living independently, completing his own ADLs.  He still drives, and does yard work around the house.  His wife manages money for the home.  We then focused on their current illness.  We talk about his increased heart rate and the treatment plan.  I  share that he will be admitted to the hospital, he will have medicine for heart rate control, he will be followed by cardiology.  We talk about time for outcomes.  I share that I will follow-up with him tomorrow to see how he is doing.  No questions or concerns at this time.  The natural disease trajectory and expectations at EOL were discussed.  Advanced directives, concepts specific to code status, artifical feeding and hydration, and rehospitalization were considered and discussed.  We talk about the concept of "treat the treatable, but allowing natural passing".  At this point, Kenneth [name] states that he would want attempted resuscitation, but not long-term life support.  I encouraged him to discuss these choices with his family.  Discussed the importance of continued conversation with family and the medical providers regarding overall plan of care and treatment options, ensuring decisions are within the context of the patient's values and GOCs.  Questions and concerns were addressed. The patient was encouraged to call with questions or concerns.  PMT will continue to support  holistically.  Conference with attending, bedside nursing staff, transition of care team related to patient condition, needs, goals of care, disposition.   HCPOA NEXT OF KIN -Kenneth [name] shares that his wife and 2 children would make healthcare choices together if he were unable.    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  Palliative Prophylaxis:  Frequent Pain Assessment, Oral Care, and Turn Reposition  Additional Recommendations (Limitations, Scope, Preferences): Full Scope Treatment  Psycho-social/Spiritual:  Desire for further Chaplaincy support:no Additional Recommendations:  Caregiving  Support/Resources  Prognosis:  Unable to determine, based on outcomes.  Discharge Planning: To Be Determined      Primary Diagnoses: Present on Admission:  Hypertensive urgency  Essential hypertension  Chronic kidney disease, stage 3a (HCC)  Mixed hyperlipidemia  GERD (gastroesophageal reflux disease)  Hypertensive crisis   I have reviewed the medical record, interviewed the patient and family, and examined the patient. The following aspects are pertinent.  Past Medical History:  Diagnosis Date   Acute ST elevation myocardial infarction (STEMI) of inferior wall ([place]) 03/01/2020   [facility]   Anxiety    Arthritis    Back (05/04/2018)   Carotid artery disease ([place])    a. 04/2018 s/p R carotid stenting.   Chronic lower back pain    "have it at night" (05/04/2018)   CKD (chronic kidney disease), stage III ([place])    Coronary artery disease    s/p CABG in 2003, patent grafts by cath in 2007, s/p inferior STEMI in 02/2020 with DES to RCA, s/p NSTEMI in 05/2020 with DES to proximal-RCA, s/p NSTEMI in 11/2020 with balloon angioplasty to mid-RCA and STEMI in 08/2021 with DESx3 to RCA   CVA (cerebral vascular accident) ([place]) 10/2017   "little numb on my left face since" (05/04/2018)   Depression    GERD (gastroesophageal reflux disease)    High triglycerides    History of kidney stones    Hyperlipidemia LDL goal <70    Hypertension    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   Peripheral vascular disease ([place])    Pneumonia 11/11/2021   Sinus bradycardia    Stroke [facility])    "he's had several little strokes; many that he wasn't aware of" (05/04/2018)   Tobacco abuse    Tracheal stenosis    Social History   Socioeconomic History   Marital status: Married    Spouse name: Not on file   Number of children: Not on file   Years of education: Not on file   Highest education level: Not on file  Occupational History   Occupation: retired    Comment:  Warden/ranger  Tobacco Use   Smoking status: Former    Packs/day: 1.00    Years: 15.00    Total pack years: 15.00    Types: Cigarettes    Start date: 04/22/1953    Quit date: 07/26/1970    Years since quitting: 52.0    Passive exposure: Never   Smokeless tobacco: Former    Types: Chew    Quit date: 05/05/2002  Vaping Use   Vaping Use: Never used  Substance and Sexual Activity   Alcohol use: Never    Alcohol/week: 0.0 standard drinks of alcohol   Drug use: Never   Sexual activity: Not Currently  Other  Topics Concern   Not on file  Social History Narrative   Walks on the treadmill every other day at the [employer] [facility] [employer] [facility] [employer] for the last couple of weeks.         Social Determinants of Health   Financial Resource Strain: Not on file  Food Insecurity: Not on file  Transportation Needs: Not on file  Physical Activity: Not on file  Stress: Not on file  Social Connections: Not on file   Family History  Problem Relation Age of Onset   Dementia Mother    Heart disease Father    Cancer Brother    Heart disease Brother    Hyperlipidemia Brother    Cancer Brother    Colon cancer Neg Hx    Scheduled Meds:  apixaban  2.5 mg Oral BID   carvedilol  6.25 mg Oral BID WC   clopidogrel  75 mg Oral Daily   ezetimibe  10 mg Oral Daily   isosorbide mononitrate  30 mg Oral Daily   losartan  50 mg Oral Daily   pantoprazole  40 mg Oral Daily   PARoxetine  20 mg Oral QHS   spironolactone  25 mg Oral Daily   Continuous Infusions:  diltiazem (CARDIZEM) infusion 5 mg/hr (08/09/22 1158)   PRN Meds:.acetaminophen **OR** acetaminophen, hydrALAZINE, HYDROmorphone (DILAUDID) injection, ondansetron **OR** ondansetron (ZOFRAN) IV, oxyCODONE Medications Prior to Admission:  Prior to Admission medications   Medication Sig Start Date End Date Taking? Authorizing Provider  acetaminophen (TYLENOL) 500 MG tablet Take 500 mg by mouth every 6 (six) hours as needed.   Yes [provider]  carvedilol  (COREG) 6.25 MG tablet Take 1 tablet (6.25 mg total) by mouth 2 (two) times daily. 06/07/22  Yes BranchAlphonse Guild, MD  ezetimibe (ZETIA) 10 MG tablet Take 1 tablet by mouth once daily 03/30/22  Yes Branch, Alphonse Guild, MD  nitroGLYCERIN (NITROSTAT) 0.4 MG SL tablet Place 1 tablet (0.4 mg total) under the tongue every 5 (five) minutes as needed for chest pain. 01/31/17 05/28/23 Yes Lendon Colonel, NP  pantoprazole (PROTONIX) 40 MG tablet Take 1 tablet by mouth once daily 07/28/21  Yes Branch, Alphonse Guild, MD  PARoxetine (PAXIL) 20 MG tablet Take 20 mg by mouth daily.   Yes [provider]  PRALUENT 75 MG/ML SOAJ INJECT 1 [place] TWO WEEKS Patient taking differently: Inject 75 mg into the skin every 14 (fourteen) days. 03/25/22  Yes Branch, Alphonse Guild, MD  amLODipine (NORVASC) 10 MG tablet Take 1 tablet (10 mg total) by mouth daily. 08/09/22 10/08/22  Manuella Ghazi, Pratik D, DO  losartan (COZAAR) 50 MG tablet Take 1 tablet (50 mg total) by mouth daily. 08/09/22 10/08/22  Manuella Ghazi, Pratik D, DO  spironolactone (ALDACTONE) 25 MG tablet Take 1 tablet (25 mg total) by mouth daily. 08/09/22 10/08/22  Heath Lark D, DO   Allergies  Allergen Reactions   Nifedipine Other (See Comments)   Lorazepam Other (See Comments)    REACTION: Alters mental status. "Turns into maniac"   Other     REACTION: Unknown to patient. States that MD states allergy per medical records   Penicillins Hives    Has patient had a PCN reaction causing immediate rash, facial/tongue/throat swelling, SOB or lightheadedness with hypotension: Yes Has patient had a PCN reaction causing severe rash involving mucus membranes or skin necrosis: No Has patient had a PCN reaction that required hospitalization No Has patient had a PCN reaction occurring within the last 10 years: No If  all of the above answers are "NO", then may proceed with Cephalosporin use.  HAS TOLERATED: cephalexin   Rosuvastatin Other (See Comments)    Sulfacetamide     REACTION: Unknown to patient. States that MD states allergy per medical records   Sulfonamide Derivatives Other (See Comments)    REACTION: Unknown to patient. States that MD states allergy per medical records   Erythromycin Rash   Statins Itching and Rash    Rash on his trunk and arms, swelling in his lips   Vancomycin Itching   Review of Systems  Unable to perform ROS: Other    Physical Exam Vitals and nursing note reviewed.  Constitutional:      General: He is not in acute distress.    Appearance: He is ill-appearing.  Cardiovascular:     Rate and Rhythm: Tachycardia present. Rhythm irregular.  Pulmonary:     Effort: Pulmonary effort is normal. No tachypnea.  Neurological:     Mental Status: He is alert.     Vital Signs: BP 93/83   Pulse 100   Temp 98.5 F (36.9 C) (Oral)   Resp 18   Ht '5\' 5"'$  (1.651 m)   Wt 82.6 kg   SpO2 93%   BMI 30.30 kg/m  Pain Scale: 0-10   Pain Score: 8    SpO2: SpO2: 93 % O2 Device:SpO2: 93 % O2 Flow Rate: .   IO: Intake/output summary: No intake or output data in the 24 hours ending 08/09/22 1304  LBM: Last BM Date : 08/07/22 Baseline Weight: Weight: 82.6 kg Most recent weight: Weight: 82.6 kg     Palliative Assessment/Data:     Time In: 1000 Time Out: 1055 Time Total: 55 minutes  Greater than 50%  of this time was spent counseling and coordinating care related to the above assessment and plan.  Signed by: Drue Novel, NP   Please contact Palliative Medicine Team phone at [phone] for questions and concerns.  For individual provider: See Shea Evans

## 2022-08-09 NOTE — ED Notes (Signed)
Per pt's family, pt is not allergic to Amlodipine.

## 2022-08-09 NOTE — Progress Notes (Signed)
  Echocardiogram 2D Echocardiogram has been performed.  Kenneth [name] 08/09/2022, 4:12 PM

## 2022-08-09 NOTE — Consult Note (Signed)
Cardiology Consult    Patient ID: Kenneth [name] MRN: [id number], DOB/AGE: 84-Dec-1942   Admit date: 08/07/2022 Date of Consult: 08/09/2022  Primary Physician: [name], MD Primary Cardiologist: Carlyle Dolly, MD Requesting Provider: P. Manuella Ghazi, DO  Patient Profile    Kenneth [name] is a 82 y.o. male with a history of CAD, ICM, chronic HFmrEF, HTN, HL, HTG, PAD, sinus bradycardia, carotid dzs s/p stenting, CKD III, CVA x 3, remote tob abuse, and laryngeal cancer s/p trach, who is being seen today for the evaluation of Afib w/ RVR and chest pain at the request of Dr. Manuella Ghazi.  Past Medical History   Past Medical History:  Diagnosis Date   Acute ST elevation myocardial infarction (STEMI) of inferior wall ([place]) 03/01/2020   [facility]   Anxiety    Arthritis    Back (05/04/2018)   Carotid artery disease ([place])    a. 04/2018 s/p R carotid stenting.   Chronic lower back pain    "have it at night" (05/04/2018)   CKD (chronic kidney disease), stage III ([place])    Coronary artery disease    s/p CABG in 2003, patent grafts by cath in 2007, s/p inferior STEMI in 02/2020 with DES to RCA, s/p NSTEMI in 05/2020 with DES to proximal-RCA, s/p NSTEMI in 11/2020 with balloon angioplasty to mid-RCA and STEMI in 08/2021 with DESx3 to RCA   CVA (cerebral vascular accident) ([place]) 10/2017   "little numb on my left face since" (05/04/2018)   Depression    GERD (gastroesophageal reflux disease)    High triglycerides    History of kidney stones    Hyperlipidemia LDL goal <70    Hypertension    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 [name]: hemorrhoids, tubular adenoma   CORNEAL TRANSPLANT Bilateral    CORONARY ARTERY BYPASS GRAFT  ~ 2003   "CABG X3"   CORONARY BALLOON ANGIOPLASTY N/A 11/26/2020   Procedure: CORONARY BALLOON ANGIOPLASTY;  Surgeon: Troy Sine, MD;  Location: [place] CV LAB;  Service: Cardiovascular;  Laterality: N/A;   CORONARY STENT INTERVENTION N/A 06/05/2020   Procedure: CORONARY STENT INTERVENTION;  Surgeon: Lorretta Harp, MD;  Location: [place] CV LAB;  Service: Cardiovascular;  Laterality: N/A;   EYE SURGERY     INSERTION OF RETROGRADE CAROTID STENT Right 05/04/2018   Procedure: INSERTION OF RIGHT  CAROTID STENT using an ABBOT- XACT carotid stent system;  Surgeon: Serafina Mitchell, MD;  Location: [place];  Service: Vascular;  Laterality: Right;   LAPAROSCOPIC CHOLECYSTECTOMY  2007   LEFT HEART CATH AND CORS/GRAFTS ANGIOGRAPHY N/A 10/07/2016   Procedure: Left Heart Cath and Cors/Grafts Angiography;  Surgeon: Troy Sine, MD;  Location: [place] CV LAB;  Service: Cardiovascular;  Laterality: N/A;   LEFT HEART CATH AND CORS/GRAFTS ANGIOGRAPHY N/A 06/05/2020   Procedure: LEFT HEART CATH AND CORS/GRAFTS ANGIOGRAPHY;  Surgeon: Lorretta Harp, MD;  Location: [place] CV LAB;  Service: Cardiovascular;  Laterality: N/A;   LEFT HEART CATH AND CORS/GRAFTS ANGIOGRAPHY N/A 11/26/2020   Procedure: LEFT HEART CATH AND CORS/GRAFTS ANGIOGRAPHY;  Surgeon: Troy Sine, MD;  Location: [place] CV LAB;  Service: Cardiovascular;  Laterality: N/A;   PARTIAL LARYNGECTOMY  1998   TRACHEOSTOMY  03/13/2018   "@ [facility]"     Allergies  Allergies  Allergen Reactions   Nifedipine Other (See Comments)   Lorazepam Other (See Comments)    REACTION: Alters mental status. "Turns into maniac"    Other     REACTION: Unknown to patient. States that MD states allergy per medical records   Penicillins Hives    Has patient had a PCN reaction causing immediate rash, facial/tongue/throat swelling, SOB or lightheadedness with hypotension: Yes Has patient had a PCN reaction causing severe rash involving mucus membranes or skin necrosis: No Has patient had a PCN reaction that required hospitalization No Has patient had a PCN reaction occurring within the last 10 years: No If all of the above answers are "NO", then may proceed with Cephalosporin use.  HAS TOLERATED: cephalexin   Rosuvastatin Other (See Comments)   Sulfacetamide     REACTION: Unknown to patient. States that MD states allergy per medical records   Sulfonamide Derivatives Other (See Comments)    REACTION: Unknown to patient. States that MD states allergy per medical records   Erythromycin Rash   Statins Itching and Rash    Rash on his trunk and arms, swelling in his lips   Vancomycin Itching    History of Present Illness    82 year old male with above past medical history including CAD, ischemic cardiomyopathy, chronic heart failure with midrange ejection fraction, hypertension, hyperlipidemia, hypertriglyceridemia, peripheral arterial disease, sinus bradycardia, carotid arterial disease status post stenting, stage III chronic kidney disease, stroke x 3, remote tobacco abuse, and laryngeal cancer status post tracheostomy.  Patient underwent remote coronary bypass grafting in 2003 with subsequent catheterization in 2007 reportedly showing patent grafts.  In August 2021, he suffered an inferior STEMI with finding of a 95% stenosis in the mid RCA, which was successfully treated with a drug-eluting stent.  At that time, the LIMA to the LAD was patent.  Notes indicate that no other grafts were found.  He required repeat catheterization November 2021 in the setting of chest pain with finding of severe proximal RCA disease.  This was  successfully treated with a drug-eluting stent.  Both the LIMA to the LAD and right internal mammary artery to the second obtuse marginal were patent.  In May 2022, he had recurrent chest pain and ruled in for non-STEMI.  He underwent diagnostic catheterization revealing severe in-stent restenosis within the mid RCA and 2 of 2 patent grafts.  The mid RCA was successfully treated with Cutting Balloon angioplasty.  More recently, in February 2023, he was admitted to [facility] related to inferior STEMI.  Diagnostic catheterization showed severe native multivessel disease with patent proximal RCA stent and severe 99% mid RCA in-stent restenosis.  3 drug-eluting stents were placed.  Catheterization report notes that the LIMA to LAD was patent and that there was no effort to find other grafts.  Echocardiogram during that admission showed midrange LV dysfunction with an EF of 45 to 50% with inferior and inferoseptal hypokinesis and grade 2 diastolic dysfunction.  Mr. Buch was last seen in cardiology clinic in November 2023, at which time he was feeling  well.  Patient lives in [place], [place] and notes that he is reasonably active.  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  He does not typically experience chest pain.  He thinks that about a week ago, he started experiencing constant chest pressure, prompting him to present to the emergency department on January 13.  Here, he was hypertensive with systolics in the 638G.  Troponin was mildly elevated with reasonably flat trend at '21  23  19  '$ 18.  After review of home medications, was discovered that he had not been taking his amlodipine or spironolactone.  He was initially placed on intravenous nitroglycerin with but this was subsequently weaned and symptoms resolved.  Plan was for discharge home earlier this morning however,  just after 11 AM, he was noted to be tachycardic and in atrial fibrillation on telemetry.  Twelve-lead ECG shows atrial fibrillation 136 bpm with a right bundle branch block and inferior infarct.  No acute changes other than rhythm change.  Patient was asymptomatic at the time was placed on IV diltiazem.  Rates are currently hovering in the low 100s.  He is asymptomatic and hemodynamically stable.  Inpatient Medications     aspirin EC  81 mg Oral Daily   carvedilol  6.25 mg Oral BID WC   enoxaparin (LOVENOX) injection  40 mg Subcutaneous Once   enoxaparin (LOVENOX) injection  80 mg Subcutaneous Q12H   ezetimibe  10 mg Oral Daily   losartan  50 mg Oral Daily   pantoprazole  40 mg Oral Daily   PARoxetine  20 mg Oral QHS   spironolactone  25 mg Oral Daily   ticagrelor  90 mg Oral BID    Family History    Family History  Problem Relation Age of Onset   Dementia Mother    Heart disease Father    Cancer Brother    Heart disease Brother    Hyperlipidemia Brother    Cancer Brother    Colon cancer Neg Hx    He indicated that his mother is deceased. He indicated that his father is deceased. He indicated that two of his three sisters are alive. He indicated that the status of his neg hx is unknown.   Social History    Social History   Socioeconomic History   Marital status: Married    Spouse name: Not on file   Number of children: Not on file   Years of education: Not on file   Highest education level: Not on file  Occupational History   Occupation: retired    Comment: Warden/ranger  Tobacco Use   Smoking status: Former    Packs/day: 1.00    Years: 15.00    Total pack years: 15.00    Types: Cigarettes    Start date: 04/22/1953    Quit date: 07/26/1970    Years since quitting: 52.0    Passive exposure: Never   Smokeless tobacco: Former    Types: Chew    Quit date: 05/05/2002  Vaping Use   Vaping Use: Never used  Substance and Sexual Activity   Alcohol use: Never     Alcohol/week: 0.0 standard drinks of alcohol   Drug use: Never   Sexual activity: Not Currently  Other Topics Concern   Not on file  Social History Narrative   Walks on the treadmill every other day at  the [facility] for the last couple of weeks.         Social Determinants of Health   Financial Resource Strain: Not on file  Food Insecurity: Not on file  Transportation Needs: Not on file  Physical Activity: Not on file  Stress: Not on file  Social Connections: Not on file  Intimate Partner Violence: Not on file     Review of Systems    General:  No chills, fever, night sweats or weight changes.  Cardiovascular: +++ chest pain, +++ chronic dyspnea on exertion, no edema, orthopnea, palpitations, paroxysmal nocturnal dyspnea. Dermatological: No rash, lesions/masses Respiratory: No cough, +++ dyspnea Urologic: No hematuria, dysuria Abdominal:   No nausea, vomiting, diarrhea, bright red blood per rectum, melena, or hematemesis Neurologic:  No visual changes, wkns, changes in mental status. All other systems reviewed and are otherwise negative except as noted above.  Physical Exam    Blood pressure (!) 148/85, pulse (!) 121, temperature 98.5 F (36.9 C), temperature source Oral, resp. rate (!) 25, height '5\' 5"'$  (1.651 m), weight 82.6 kg, SpO2 96 %.  General: Pleasant, NAD Psych: Normal affect. Neuro: Alert and oriented X 3. Moves all extremities spontaneously. HEENT: Normal  Neck: Supple without bruits or JVD. Lungs:  Resp regular and unlabored, diminished breath sounds bilaterally. Heart: IR, IR, mildly tachycardic, no s3, s4, or murmurs. Abdomen: Soft, non-tender, non-distended, BS + x 4.  Extremities: No clubbing, cyanosis or edema. DP/PT1+, Radials 2+ and equal bilaterally.  Labs    Cardiac Enzymes Recent Labs  Lab 08/07/22 2155 08/08/22 0035 08/08/22 0736 08/08/22 0944  TROPONINIHS 21* 23* 19* 18*     BNP    Component Value Date/Time   BNP 196.0 (H) 05/30/2022 1224    BNP 21 11/10/2018 1547    Lab Results  Component Value Date   WBC 9.0 08/08/2022   HGB 11.1 (L) 08/08/2022   HCT 34.5 (L) 08/08/2022   MCV 90.8 08/08/2022   PLT 188 08/08/2022    Recent Labs  Lab 08/08/22 0317 08/09/22 0913  NA 139 136  K 3.7 3.7  CL 108 103  CO2 24 20*  BUN 16 24*  CREATININE 1.40* 1.59*  CALCIUM 8.4* 8.2*  PROT 5.9*  --   BILITOT 0.3  --   ALKPHOS 95  --   ALT 18  --   AST 22  --   GLUCOSE 124* 103*   Lab Results  Component Value Date   CHOL 91 12/19/2020   HDL 33 (L) 12/19/2020   LDLCALC 10 12/19/2020   TRIG 239 (H) 12/19/2020   Lab Results  Component Value Date   DDIMER 0.39 06/10/2018      Radiology Studies    DG Chest Portable 1 View  Result Date: 08/07/2022 CLINICAL DATA:  Chest pain EXAM: PORTABLE CHEST 1 VIEW COMPARISON:  05/30/2022 FINDINGS: Single frontal view of the chest demonstrates tracheostomy tube unchanged. Cardiac silhouette is stable. Postsurgical changes from median sternotomy. No acute airspace disease, effusion, or pneumothorax. Chronic elevation right hemidiaphragm. No acute bony abnormality. IMPRESSION: 1. Stable chest, no acute process. Electronically Signed   By: Randa Ngo M.D.   On: 08/07/2022 22:16    ECG & Cardiac Imaging    January 13-regular sinus rhythm, 70, PVCs, right bundle branch block, inferior infarct-  personally reviewed.  January 15-atrial fibrillation, 136, right bundle branch block, inferior infarct-personally reviewed  Assessment & Plan    1.  Atrial fibrillation rapid ventricular response: Patient without prior history of  atrial fibrillation.  Presented to the emergency department on January 13 due to constant chest pain with mild troponin elevations and flat trend.  He was actually due for discharge this morning but then developed A-fib with RVR and rates into the 130s.  He was asymptomatic at that time and remains asymptomatic now.  He has been placed on an IV diltiazem infusion with  rates currently in the low 100s.  CHA2DS2-VASc equals 7.  He is currently on dual antiplatelet therapy in the outpatient setting with aspirin and Brilinta, as he is status post inferior STEMI in February 2023 requiring 3 drug-eluting stents.  He has had in-stent restenosis on several occasions as well.  Will discuss with Dr. Dellia Cloud - will likely add eliquis (creat trends 1.4-1.5 - currently 1.59, thus @ his age, 2.'5mg'$  BID would be appropriate), change brilinta to clopidogrel, and d/c ASA.  Cont IV dilt for the time being.  Resume home dose of carvedilol.  He has a h/o sinus bradycardia on outpt ? blocker therapy, thus will need to watch carefully.  High risk for tachy-brady syndrome.  If he remains in Afib in AM, likely plan DCCV.  2.  CAD/Demand ischemia: History of CAD status post remote CABG in 2003 with multiple RCA stents since then related to both de novo disease and more recently in-stent restenosis.  Most recent inferior STEMI in February 2023, at which time the mid RCA was treated with 3 drug-eluting stents.  Presented to the emergency department on January 13 due to recurrent constant chest pain.  Was noted to have mild troponin elevations with a flat trend at '21  23  19  '$ 18.  He was hypertensive on arrival and treated for hypertensive urgency.  He was able to be weaned off of IV nitroglycerin over the weekend and was chest pain-free today with plan for discharge.  Despite rapid A-fib, he remained chest pain-free and otherwise asymptomatic.  Follow-up echocardiogram to reevaluate LV function and consider inpatient versus outpatient evaluation pending results.  He is on aspirin, beta-blocker, ARB, Zetia, Praluent and Brilinta therapy in the outpatient setting.  Will have to adjust his antiplatelet therapy in the setting of new A-fib as outlined above.  He is statin intolerant with an LDL of 10 in May 2022.  Add long-acting nitrate.  3.  Essential hypertension: Hypertensive urgency on arrival to the  emergency department.  Pressures improved on IV nitroglycerin but have been trending mostly in the 140s since coming off of this.  Continue beta-blocker and ARB.  Add long-acting nitrate in the setting of chest pain/demand ischemia.  Consider transitioning losartan to Entresto in the setting of midrange LV dysfunction.  4.  Ischemic cardiomyopathy/chronic heart failure with midrange ejection fraction: Most recent echo performed at outside hospital in February 2023 showed an EF of 45 to 50% with inferior and inferoseptal hypokinesis and grade 2 diastolic dysfunction.  He has been managed with beta-blocker and ARB therapy in the outpatient setting.  He is euvolemic on examination at this time.  Follow-up echo in the setting of new onset atrial fibrillation and demand ischemia.  Pending stability of renal function, can consider transitioning losartan to Entresto.  Consider SGLT2 inhibitor.  5.  Hyperlipidemia: Statin intolerant though was on Zetia and Praluent as an outpatient with an LDL of 10 in May 2022.  6.  Stage III chronic kidney disease: Creatinine appears to trend 1.4-1.5.  He is 1.59 this morning.  On ARB therapy at home.  7.  Carotid  arterial disease: Status post prior stenting.  Most recent ultrasound in October 2022 showed a 19 to 75% stenosis in the mid common carotid arterial stent with patent right internal carotid and 80 to 99% left internal carotid artery stenosis.  He is followed by vascular surgery and has been managed conservatively due to risk for intervention and patient preference.  On outpatient dual antiplatelet therapy-adjustments required in the setting of oral anticoagulation.  Risk Assessment/Risk Scores:     TIMI Risk Score for Unstable Angina or Non-ST Elevation MI:   The patient's TIMI risk score is 6, which indicates a 41% risk of all cause mortality, new or recurrent myocardial infarction or need for urgent revascularization in the next 14 days.    CHA2DS2-VASc Score =  7   This indicates a 11.2% annual risk of stroke. The patient's score is based upon: CHF History: 1 HTN History: 1 Diabetes History: 0 Stroke History: 2 Vascular Disease History: 1 Age Score: 2 Gender Score: 0     Signed, Murray Hodgkins, NP 08/09/2022, 12:50 PM  For questions or updates, please contact   Please consult [email or web address] for contact info under Cardiology/STEMI.

## 2022-08-09 NOTE — ED Notes (Signed)
Informed Dr.Shah that pt's in Afib with sustaining rate of 120-130s. EKG done. Dr. Manuella Ghazi acknowledged, will order Cardizem drip. EKG was also shown to Dr. Melina Copa, [place] per MD, not STEMI.

## 2022-08-09 NOTE — Progress Notes (Signed)
Patient seen and evaluated this a.m. with no acute events or concerns overnight.  Please refer to discharge summary dictated 1/14 for full details.  Patient resumed on usual home medications to include amlodipine and spironolactone to his usual Coreg and losartan.  No further adjustments have been made and recommendations are for close follow-up with cardiology as noted.  He is stable for discharge.  Total care time: 20 minutes.

## 2022-08-09 NOTE — Progress Notes (Signed)
ANTICOAGULATION CONSULT NOTE - Initial Consult Pharmacy Consult for ENOXAPARIN DOSING  Indication: atrial fibrillation  Allergies  Allergen Reactions   Nifedipine Other (See Comments)   Lorazepam Other (See Comments)    REACTION: Alters mental status. "Turns into maniac"   Other     REACTION: Unknown to patient. States that MD states allergy per medical records   Penicillins Hives    Has patient had a PCN reaction causing immediate rash, facial/tongue/throat swelling, SOB or lightheadedness with hypotension: Yes Has patient had a PCN reaction causing severe rash involving mucus membranes or skin necrosis: No Has patient had a PCN reaction that required hospitalization No Has patient had a PCN reaction occurring within the last 10 years: No If all of the above answers are "NO", then may proceed with Cephalosporin use.  HAS TOLERATED: cephalexin   Rosuvastatin Other (See Comments)   Sulfacetamide     REACTION: Unknown to patient. States that MD states allergy per medical records   Sulfonamide Derivatives Other (See Comments)    REACTION: Unknown to patient. States that MD states allergy per medical records   Erythromycin Rash   Statins Itching and Rash    Rash on his trunk and arms, swelling in his lips   Vancomycin Itching    Patient Measurements: Height: '5\' 5"'$  (165.1 cm) Weight: 82.6 kg (182 lb 1.6 oz) IBW/kg (Calculated) : 61.5  Vital Signs: Temp: 98.5 F (36.9 C) (01/15 1018) Temp Source: Oral (01/15 1018) BP: 148/85 (01/15 1130) Pulse Rate: 121 (01/15 1130)  Labs: Recent Labs    08/07/22 2155 08/08/22 0035 08/08/22 0317 08/08/22 0736 08/08/22 0944 08/09/22 0913  HGB 12.7*  --  11.1*  --   --   --   HCT 39.2  --  34.5*  --   --   --   PLT 207  --  188  --   --   --   APTT 26  --   --   --   --   --   LABPROT 12.7  --   --   --   --   --   INR 1.0  --   --   --   --   --   CREATININE 1.45*  --  1.40*  --   --  1.59*  TROPONINIHS 21* 23*  --  19* 18*  --      Estimated Creatinine Clearance: 34.8 mL/min (A) (by C-G formula based on SCr of 1.59 mg/dL (H)).   Medical History: Past Medical History:  Diagnosis Date   Acute ST elevation myocardial infarction (STEMI) of inferior wall ([place]) 03/01/2020   [facility]   Anxiety    Arthritis    Back (05/04/2018)   Carotid artery disease ([place])    a. h/o R carotid stent.   Chronic lower back pain    "have it at night" (05/04/2018)   CKD (chronic kidney disease), stage III ([place])    Coronary artery disease    s/p CABG in 2003, patent grafts by cath in 2007, s/p inferior STEMI in 02/2020 with DES to RCA, s/p NSTEMI in 05/2020 with DES to proximal-RCA, s/p NSTEMI in 11/2020 with balloon angioplasty to mid-RCA and STEMI in 08/2021 with DESx3 to RCA   CVA (cerebral vascular accident) ([place]) 10/2017   "little numb on my left face since" (05/04/2018)   Depression    GERD (gastroesophageal reflux disease)    High triglycerides    History of kidney stones  Hyperlipidemia LDL goal <70    Hypertension    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   Peripheral vascular disease ([place])    Pneumonia 11/11/2021   Sinus bradycardia    Stroke [facility])    "he's had several little strokes; many that he wasn't aware of" (05/04/2018)   Tobacco abuse    Tracheal stenosis     Medications:  Scheduled:   aspirin EC  81 mg Oral Daily   carvedilol  6.25 mg Oral BID WC   enoxaparin (LOVENOX) injection  40 mg Subcutaneous Once   enoxaparin (LOVENOX) injection  80 mg Subcutaneous Q12H   ezetimibe  10 mg Oral Daily   losartan  50 mg Oral Daily   pantoprazole  40 mg Oral Daily   PARoxetine  20 mg Oral QHS   spironolactone  25 mg Oral Daily   ticagrelor  90 mg Oral BID    Assessment: Patient in A.Fib Goal of Therapy:  Anti-Xa level 0.6-1 units/ml 4hrs after LMWH dose given Monitor platelets by anticoagulation protocol: Yes   Plan:  Lovenox 80 mg subq BID   Rosebud Poles, PharmD Clinical  Pharmacist 08/09/2022 12:03 PM

## 2022-08-10 ENCOUNTER — Other Ambulatory Visit (HOSPITAL_COMMUNITY): Payer: Self-pay

## 2022-08-10 DIAGNOSIS — Z515 Encounter for palliative care: Secondary | ICD-10-CM | POA: Diagnosis not present

## 2022-08-10 DIAGNOSIS — I160 Hypertensive urgency: Secondary | ICD-10-CM | POA: Diagnosis not present

## 2022-08-10 DIAGNOSIS — Z7189 Other specified counseling: Secondary | ICD-10-CM | POA: Diagnosis not present

## 2022-08-10 DIAGNOSIS — I2510 Atherosclerotic heart disease of native coronary artery without angina pectoris: Secondary | ICD-10-CM | POA: Diagnosis not present

## 2022-08-10 DIAGNOSIS — I4891 Unspecified atrial fibrillation: Secondary | ICD-10-CM | POA: Diagnosis not present

## 2022-08-10 MED ORDER — LOSARTAN POTASSIUM 25 MG PO TABS: 25.0000 mg | ORAL_TABLET | Freq: Every day | ORAL | Status: DC

## 2022-08-10 MED ORDER — CARVEDILOL 3.125 MG PO TABS
9.3750 mg | ORAL_TABLET | Freq: Two times a day (BID) | ORAL | Status: DC
  Administered 2022-08-10 – 2022-08-11 (×2): 9.375 mg via ORAL
  Filled 2022-08-10 (×2): qty 3

## 2022-08-10 MED ORDER — CARVEDILOL 3.125 MG PO TABS
3.1250 mg | ORAL_TABLET | Freq: Once | ORAL | Status: AC
  Administered 2022-08-10: 3.125 mg via ORAL
  Filled 2022-08-10: qty 1

## 2022-08-10 MED ORDER — ISOSORBIDE MONONITRATE ER 30 MG PO TB24: 30.0000 mg | ORAL_TABLET | Freq: Every day | ORAL | 1 refills | Status: DC

## 2022-08-10 MED ORDER — CARVEDILOL 3.125 MG PO TABS: 9.3750 mg | ORAL_TABLET | Freq: Two times a day (BID) | ORAL | 0 refills | Status: DC

## 2022-08-10 MED ORDER — APIXABAN 2.5 MG PO TABS: 2.5000 mg | ORAL_TABLET | Freq: Two times a day (BID) | ORAL | 0 refills | Status: DC

## 2022-08-10 MED ORDER — CLOPIDOGREL BISULFATE 75 MG PO TABS: 75.0000 mg | ORAL_TABLET | Freq: Every day | ORAL | 1 refills | Status: DC

## 2022-08-10 MED ORDER — LOSARTAN POTASSIUM 25 MG PO TABS: 25.0000 mg | ORAL_TABLET | Freq: Every day | ORAL | 1 refills | Status: DC

## 2022-08-10 NOTE — Care Management Important Message (Signed)
Important Message  Patient Details  Name: Kenneth [name] MRN: [id number] Date of Birth: 1940/09/06   Medicare Important Message Given:  N/A - LOS <3 / Initial given by admissions     Tommy Medal 08/10/2022, 11:14 AM

## 2022-08-10 NOTE — Progress Notes (Signed)
Palliative: Kenneth [name] is resting quietly in bed.  He greets me, making and somewhat keeping eye contact.  He is alert and oriented x 3, able to make his needs known.  His wife is present at bedside.  We talked about his acute A-fib with RVR and the treatment plan in detail.  Kenneth [name] shares that he would like to go home as soon as possible.  I encouraged him to have patient's, and make sure that he is optimized before leaving.  We talk about A-fib and sequela.  Conference with attending, bedside nursing staff, transition of care team related to patient condition, needs, goals of care, disposition.  Plan: Continue full scope/full code.  Time for outcomes.  Discharging home, no needs.  40 minutes Quinn Axe, NP Palliative medicine team Team phone [phone] Greater than 50% of this time was spent counseling and coordinating care related to the above assessment and plan.

## 2022-08-10 NOTE — Discharge Summary (Signed)
Physician Discharge Summary  BERND CROM UXL:[id number] DOB: [birth year] DOA: 08/07/2022  PCP: [name], MD  Admit date: 08/07/2022  Discharge date: 08/10/2022  Admitted From:Home  Disposition:  Home  Recommendations for Outpatient Follow-up:  Follow up with PCP in 1-2 weeks Follow-up with cardiology, Dr. [name] in the near future which will be scheduled Remain on Plavix instead of aspirin and Brilinta as prescribed Now on Coreg 9.75 mg twice daily and started on Eliquis 2.5 mg twice daily for new onset atrial fibrillation with RVR Continue other home blood pressure medications as prior with the addition of Imdur and losartan dose cut in half to 25 mg daily  Home Health: None  Equipment/Devices: None  Discharge Condition:Stable  CODE STATUS: Full  Diet recommendation: Heart Healthy  Brief/Interim Summary: Per HPI: Kenneth [name] is a 82 y.o. male with medical history significant of hypertension, hyperlipidemia, GERD, CAD s/p CABG who presents to the emergency department due to chest pain.  Patient states that his blood pressure was highly elevated at home with SBP in the 200s, this was associated with left-sided chest pain with radiation to left shoulder.  Chest pain was rated as 7/10 on pain scale, he took nitroglycerin at home without any improvement, so, wife activated EMS and patient was taken to the ED for further evaluation and management.    Patient was admitted for hypertensive urgency and had associated chest pain as well as elevated troponin that is likely demand ischemia.  He was started on nitroglycerin drip.  He appears to be compliant with his home medications, but it appears that he was not taking his amlodipine and spironolactone as his wife did not have those medications with her upon medication reconciliation here.  She claims that there were no additional medications that she left behind.  He was able to wean off of his nitroglycerin and appeared to be stable  for discharge on 1/15, but then developed new onset atrial fibrillation with RVR.  His TSH was within normal limits and 2D echocardiogram with LVEF 55-60% and no other acute abnormalities noted.  He was started on Cardizem drip and seen by cardiology.  His Coreg dose has not been increased as noted above and he is now on Eliquis for anticoagulation.  He will have close follow-up with cardiology and is otherwise in stable condition now for discharge.  Discharge Diagnoses:  Principal Problem:   Hypertensive urgency Active Problems:   Mixed hyperlipidemia   CAD S/P percutaneous coronary angioplasty   Essential hypertension   Chronic kidney disease, stage 3a (HCC)   GERD (gastroesophageal reflux disease)   Depression   Hypertensive crisis  Principal discharge diagnosis: Chest pain with elevated troponin secondary to demand ischemia in the setting of hypertensive urgency along with new onset atrial fibrillation with RVR.  Discharge Instructions  Discharge Instructions     Ambulatory referral to Cardiology   Complete by: As directed    Diet - low sodium heart healthy   Complete by: As directed    Diet - low sodium heart healthy   Complete by: As directed    Increase activity slowly   Complete by: As directed    Increase activity slowly   Complete by: As directed       Allergies as of 08/10/2022       Reactions   Nifedipine Other (See Comments)   Lorazepam Other (See Comments)   REACTION: Alters mental status. "Turns into maniac"   Other    REACTION: Unknown to  patient. States that MD states allergy per medical records   Penicillins Hives   Has patient had a PCN reaction causing immediate rash, facial/tongue/throat swelling, SOB or lightheadedness with hypotension: Yes Has patient had a PCN reaction causing severe rash involving mucus membranes or skin necrosis: No Has patient had a PCN reaction that required hospitalization No Has patient had a PCN reaction occurring within the  last 10 years: No If all of the above answers are "NO", then may proceed with Cephalosporin use. HAS TOLERATED: cephalexin   Rosuvastatin Other (See Comments)   Sulfacetamide    REACTION: Unknown to patient. States that MD states allergy per medical records   Sulfonamide Derivatives Other (See Comments)   REACTION: Unknown to patient. States that MD states allergy per medical records   Erythromycin Rash   Statins Itching, Rash   Rash on his trunk and arms, swelling in his lips   Vancomycin Itching        Medication List     STOP taking these medications    aspirin EC 81 MG tablet   Brilinta 90 MG Tabs tablet Generic drug: ticagrelor       TAKE these medications    acetaminophen 500 MG tablet Commonly known as: TYLENOL Take 500 mg by mouth every 6 (six) hours as needed.   amLODipine 10 MG tablet Commonly known as: NORVASC Take 1 tablet (10 mg total) by mouth daily.   apixaban 2.5 MG Tabs tablet Commonly known as: ELIQUIS Take 1 tablet (2.5 mg total) by mouth 2 (two) times daily.   carvedilol 3.125 MG tablet Commonly known as: COREG Take 3 tablets (9.375 mg total) by mouth 2 (two) times daily with a meal. What changed:  medication strength how much to take when to take this   clopidogrel 75 MG tablet Commonly known as: PLAVIX Take 1 tablet (75 mg total) by mouth daily. Start taking on: August 11, 2022   ezetimibe 10 MG tablet Commonly known as: ZETIA Take 1 tablet by mouth once daily   isosorbide mononitrate 30 MG 24 hr tablet Commonly known as: IMDUR Take 1 tablet (30 mg total) by mouth daily. Start taking on: August 11, 2022   losartan 25 MG tablet Commonly known as: COZAAR Take 1 tablet (25 mg total) by mouth daily. Start taking on: August 11, 2022 What changed:  medication strength how much to take   nitroGLYCERIN 0.4 MG SL tablet Commonly known as: Nitrostat Place 1 tablet (0.4 mg total) under the tongue every 5 (five) minutes as needed  for chest pain.   pantoprazole 40 MG tablet Commonly known as: PROTONIX Take 1 tablet by mouth once daily   PARoxetine 20 MG tablet Commonly known as: PAXIL Take 20 mg by mouth daily.   Praluent 75 MG/ML Soaj Generic drug: Alirocumab INJECT 1 PEN SUBCUTANEOUSLY EVERY TWO WEEKS What changed: See the new instructions.   spironolactone 25 MG tablet Commonly known as: ALDACTONE Take 1 tablet (25 mg total) by mouth daily.        Follow-up Information     [name], MD. Schedule an appointment as soon as possible for a visit in 1 week(s).   Specialty: Internal Medicine Contact information: [address] [place] [address] [phone]                Allergies  Allergen Reactions   Nifedipine Other (See Comments)   Lorazepam Other (See Comments)    REACTION: Alters mental status. "Turns into maniac"  Other     REACTION: Unknown to patient. States that MD states allergy per medical records   Penicillins Hives    Has patient had a PCN reaction causing immediate rash, facial/tongue/throat swelling, SOB or lightheadedness with hypotension: Yes Has patient had a PCN reaction causing severe rash involving mucus membranes or skin necrosis: No Has patient had a PCN reaction that required hospitalization No Has patient had a PCN reaction occurring within the last 10 years: No If all of the above answers are "NO", then may proceed with Cephalosporin use.  HAS TOLERATED: cephalexin   Rosuvastatin Other (See Comments)   Sulfacetamide     REACTION: Unknown to patient. States that MD states allergy per medical records   Sulfonamide Derivatives Other (See Comments)    REACTION: Unknown to patient. States that MD states allergy per medical records   Erythromycin Rash   Statins Itching and Rash    Rash on his trunk and arms, swelling in his lips   Vancomycin Itching    Consultations: Cardiology   Procedures/Studies: ECHOCARDIOGRAM COMPLETE  Result Date:  08/09/2022    ECHOCARDIOGRAM REPORT   Patient Name:   ZAIDYN CLAIRE Date of Exam: 08/09/2022 Medical Rec #:  [id number]        Height:       65.0 in Accession #:    [id number]       Weight:       182.1 lb Date of Birth:  26-Dec-1940        BSA:          1.901 m Patient Age:    82 years         BP:           122/80 mmHg Patient Gender: M                HR:           81 bpm. Exam Location:  [facility] Procedure: 2D Echo, Cardiac Doppler, Color Doppler and Intracardiac            Opacification Agent Indications:    Atrial Fibrillation I48.91  History:        Patient has prior history of Echocardiogram examinations, most                 recent 12/18/2020. CAD and Previous Myocardial Infarction, Prior                 CABG, Stroke, Signs/Symptoms:Chest Pain; Risk                 Factors:Dyslipidemia, Hypertension and Current Smoker.  Sonographer:    Greer Pickerel Referring Phys: [id number] [facility]  Sonographer Comments: Image acquisition challenging due to respiratory motion. IMPRESSIONS  1. Left ventricular ejection fraction, by estimation, is 55 to 60%. The left ventricle has normal function. The left ventricle has no regional wall motion abnormalities. Left ventricular diastolic function could not be evaluated.  2. Right ventricular systolic function is mildly reduced. The right ventricular size is mildly enlarged.  3. The mitral valve is grossly normal. No evidence of mitral valve regurgitation. No evidence of mitral stenosis.  4. The aortic valve was not well visualized. There is mild calcification of the aortic valve. Aortic valve regurgitation is not visualized. No aortic stenosis is present.  5. Aortic dilatation noted. There is borderline dilatation of the aortic root, measuring 38 mm. Comparison(s): Changes from prior study are noted. LVEF improved from 45-50% in 08/2021 tio 55-60%  now. FINDINGS  Left Ventricle: Left ventricular ejection fraction, by estimation, is 55 to 60%. The left ventricle has normal  function. The left ventricle has no regional wall motion abnormalities. Definity contrast agent was given IV to delineate the left ventricular  endocardial borders. The left ventricular internal cavity size was normal in size. There is no left ventricular hypertrophy. Left ventricular diastolic function could not be evaluated due to atrial fibrillation. Left ventricular diastolic function could  not be evaluated. Right Ventricle: The right ventricular size is mildly enlarged. No increase in right ventricular wall thickness. Right ventricular systolic function is mildly reduced. Left Atrium: Left atrial size was normal in size. Right Atrium: Right atrial size was normal in size. Pericardium: There is no evidence of pericardial effusion. Mitral Valve: The mitral valve is grossly normal. No evidence of mitral valve regurgitation. No evidence of mitral valve stenosis. Tricuspid Valve: The tricuspid valve is grossly normal. Tricuspid valve regurgitation is not demonstrated. No evidence of tricuspid stenosis. Aortic Valve: The aortic valve was not well visualized. There is mild calcification of the aortic valve. There is mild aortic valve annular calcification. Aortic valve regurgitation is not visualized. No aortic stenosis is present. Pulmonic Valve: The pulmonic valve was not well visualized. Pulmonic valve regurgitation is not visualized. No evidence of pulmonic stenosis. Aorta: Aortic dilatation noted. There is borderline dilatation of the aortic root, measuring 38 mm. Venous: The inferior vena cava was not well visualized. IAS/Shunts: The interatrial septum was not well visualized.  LEFT VENTRICLE PLAX 2D LVIDd:         4.50 cm   Diastology LVIDs:         3.30 cm   LV e' medial:    6.68 cm/s LV PW:         1.10 cm   LV E/e' medial:  13.4 LV IVS:        0.90 cm   LV e' lateral:   13.40 cm/s LVOT diam:     2.00 cm   LV E/e' lateral: 6.7 LV SV:         52 LV SV Index:   27 LVOT Area:     3.14 cm  RIGHT VENTRICLE RV S  prime:     10.10 cm/s TAPSE (M-mode): 1.2 cm LEFT ATRIUM         Index       RIGHT ATRIUM           Index LA diam:    4.10 cm 2.16 cm/m  RA Area:     17.70 cm                                 RA Volume:   46.20 ml  24.30 ml/m  AORTIC VALVE LVOT Vmax:   114.00 cm/s LVOT Vmean:  74.100 cm/s LVOT VTI:    0.165 m  AORTA Ao Root diam: 3.80 cm MITRAL VALVE MV Area (PHT): 4.77 cm    SHUNTS MV Decel Time: 159 msec    Systemic VTI:  0.16 m MV E velocity: 89.50 cm/s  Systemic Diam: 2.00 cm Vishnu Priya Mallipeddi Electronically signed by Lorelee Cover Mallipeddi Signature Date/Time: 08/09/2022/4:22:54 PM    Final    DG Chest Portable 1 View  Result Date: 08/07/2022 CLINICAL DATA:  Chest pain EXAM: PORTABLE CHEST 1 VIEW COMPARISON:  05/30/2022 FINDINGS: Single frontal view of the chest demonstrates tracheostomy tube unchanged. Cardiac silhouette is stable.  Postsurgical changes from median sternotomy. No acute airspace disease, effusion, or pneumothorax. Chronic elevation right hemidiaphragm. No acute bony abnormality. IMPRESSION: 1. Stable chest, no acute process. Electronically Signed   By: Randa Ngo M.D.   On: 08/07/2022 22:16     Discharge Exam: Vitals:   08/10/22 0700 08/10/22 0800  BP: 112/77 109/63  Pulse: 78 68  Resp: (!) 21 17  Temp:    SpO2: 97% 99%   Vitals:   08/10/22 0500 08/10/22 0600 08/10/22 0700 08/10/22 0800  BP: 107/70 (!) 107/50 112/77 109/63  Pulse: (!) 111 72 78 68  Resp: (!) 21 19 (!) 21 17  Temp:      TempSrc:      SpO2: 97% 98% 97% 99%  Weight:      Height:        General: Pt is alert, awake, not in acute distress Cardiovascular: Irregular heart rate, S1/S2 +, no rubs, no gallops Respiratory: CTA bilaterally, no wheezing, no rhonchi Abdominal: Soft, NT, ND, bowel sounds + Extremities: no edema, no cyanosis    The results of significant diagnostics from this hospitalization (including imaging, microbiology, ancillary and laboratory) are listed below for  reference.     Microbiology: Recent Results (from the past 240 hour(s))  MRSA Next Gen by PCR, Nasal     Status: None   Collection Time: 08/09/22  6:50 PM   Specimen: Nasal Mucosa; Nasal Swab  Result Value Ref Range Status   MRSA by PCR Next Gen NOT DETECTED NOT DETECTED Final    Comment: (NOTE) The GeneXpert MRSA Assay (FDA approved for NASAL specimens only), is one component of a comprehensive MRSA colonization surveillance program. It is not intended to diagnose MRSA infection nor to guide or monitor treatment for MRSA infections. Test performance is not FDA approved in patients less than 19 years old. Performed at [facility], [address], [place], [place] [address]      Labs: BNP (last 3 results) Recent Labs    05/30/22 1224  BNP 628.3*   Basic Metabolic Panel: Recent Labs  Lab 08/07/22 2155 08/08/22 0317 08/09/22 0913  NA 139 139 136  K 3.6 3.7 3.7  CL 107 108 103  CO2 24 24 20*  GLUCOSE 110* 124* 103*  BUN 14 16 24*  CREATININE 1.45* 1.40* 1.59*  CALCIUM 8.8* 8.4* 8.2*  MG  --   --  1.9   Liver Function Tests: Recent Labs  Lab 08/08/22 0317  AST 22  ALT 18  ALKPHOS 95  BILITOT 0.3  PROT 5.9*  ALBUMIN 3.1*   No results for input(s): "LIPASE", "AMYLASE" in the last 168 hours. No results for input(s): "AMMONIA" in the last 168 hours. CBC: Recent Labs  Lab 08/07/22 2155 08/08/22 0317  WBC 8.2 9.0  HGB 12.7* 11.1*  HCT 39.2 34.5*  MCV 91.2 90.8  PLT 207 188   Cardiac Enzymes: No results for input(s): "CKTOTAL", "CKMB", "CKMBINDEX", "TROPONINI" in the last 168 hours. BNP: Invalid input(s): "POCBNP" CBG: Recent Labs  Lab 08/07/22 2218  GLUCAP 126*   D-Dimer No results for input(s): "DDIMER" in the last 72 hours. Hgb A1c No results for input(s): "HGBA1C" in the last 72 hours. Lipid Profile No results for input(s): "CHOL", "HDL", "LDLCALC", "TRIG", "CHOLHDL", "LDLDIRECT" in the last 72 hours. Thyroid function studies Recent Labs     08/09/22 0913  TSH 2.392   Anemia work up No results for input(s): "VITAMINB12", "FOLATE", "FERRITIN", "TIBC", "IRON", "RETICCTPCT" in the last 72 hours.  Urinalysis    Component Value Date/Time   COLORURINE STRAW (A) 11/14/2021 2121   APPEARANCEUR CLEAR 11/14/2021 2121   LABSPEC 1.005 11/14/2021 2121   PHURINE 6.0 11/14/2021 2121   GLUCOSEU NEGATIVE 11/14/2021 2121   GLUCOSEU neg 06/01/2010 1113   HGBUR NEGATIVE 11/14/2021 2121   BILIRUBINUR NEGATIVE 11/14/2021 2121   KETONESUR NEGATIVE 11/14/2021 2121   PROTEINUR NEGATIVE 11/14/2021 2121   NITRITE NEGATIVE 11/14/2021 2121   LEUKOCYTESUR NEGATIVE 11/14/2021 2121   Sepsis Labs Recent Labs  Lab 08/07/22 2155 08/08/22 0317  WBC 8.2 9.0   Microbiology Recent Results (from the past 240 hour(s))  MRSA Next Gen by PCR, Nasal     Status: None   Collection Time: 08/09/22  6:50 PM   Specimen: Nasal Mucosa; Nasal Swab  Result Value Ref Range Status   MRSA by PCR Next Gen NOT DETECTED NOT DETECTED Final    Comment: (NOTE) The GeneXpert MRSA Assay (FDA approved for NASAL specimens only), is one component of a comprehensive MRSA colonization surveillance program. It is not intended to diagnose MRSA infection nor to guide or monitor treatment for MRSA infections. Test performance is not FDA approved in patients less than 44 years old. Performed at [facility], [address] [place], [place] [address]      Time coordinating discharge: 35 minutes  SIGNED:   Rodena Goldmann, DO [facility] 08/10/2022, 10:49 AM  If 7PM-7AM, please contact night-coverage [email or web address]

## 2022-08-10 NOTE — TOC Benefit Eligibility Note (Signed)
Patient Teacher, English as a foreign language completed.    The patient is currently admitted and upon discharge could be taking Eliquis 5 mg.  The current 30 day co-pay is $47.00.   The patient is insured through [place], [place] Patient Advocate Specialist [place] Patient Advocate Team Direct Number: [phone]  Fax: [phone]

## 2022-08-10 NOTE — Progress Notes (Signed)
Rounding Note    Patient Name: Kenneth [name] Date of Encounter: 08/10/2022  [facility] Cardiologist: Carlyle Dolly, MD   Subjective   No complaints Inpatient Medications    Scheduled Meds:  apixaban  2.5 mg Oral BID   carvedilol  6.25 mg Oral BID WC   Chlorhexidine Gluconate Cloth  6 each Topical Daily   clopidogrel  75 mg Oral Daily   ezetimibe  10 mg Oral Daily   isosorbide mononitrate  30 mg Oral Daily   losartan  50 mg Oral Daily   pantoprazole  40 mg Oral Daily   PARoxetine  20 mg Oral QHS   spironolactone  25 mg Oral Daily   Continuous Infusions:  diltiazem (CARDIZEM) infusion 5 mg/hr (08/10/22 0746)   PRN Meds: acetaminophen **OR** acetaminophen, hydrALAZINE, HYDROmorphone (DILAUDID) injection, ondansetron **OR** ondansetron (ZOFRAN) IV, oxyCODONE   Vital Signs    Vitals:   08/10/22 0435 08/10/22 0500 08/10/22 0600 08/10/22 0800  BP:  107/70 (!) 107/50   Pulse:  (!) 111 72 68  Resp:  (!) '21 19 17  '$ Temp: 99.1 F (37.3 C)     TempSrc: Oral     SpO2:  97% 98% 99%  Weight:      Height:        Intake/Output Summary (Last 24 hours) at 08/10/2022 0814 Last data filed at 08/10/2022 0302 Gross per 24 hour  Intake 409.74 ml  Output 450 ml  Net -40.26 ml      08/09/2022    6:45 PM 08/07/2022    9:23 PM 05/30/2022   10:35 AM  Last 3 Weights  Weight (lbs) 177 lb 11.1 oz 182 lb 1.6 oz 182 lb  Weight (kg) 80.6 kg 82.6 kg 82.555 kg      Telemetry    Afib 60s- 70s - Personally Reviewed  ECG    N/a - Personally Reviewed  Physical Exam   GEN: No acute distress.   Neck: No JVD Cardiac: irreg Respiratory: Clear to auscultation bilaterally. GI: Soft, nontender, non-distended  MS: No edema; No deformity. Neuro:  Nonfocal  Psych: Normal affect   Labs    High Sensitivity Troponin:   Recent Labs  Lab 08/07/22 2155 08/08/22 0035 08/08/22 0736 08/08/22 0944  TROPONINIHS 21* 23* 19* 18*     Chemistry Recent Labs  Lab  08/07/22 2155 08/08/22 0317 08/09/22 0913  NA 139 139 136  K 3.6 3.7 3.7  CL 107 108 103  CO2 24 24 20*  GLUCOSE 110* 124* 103*  BUN 14 16 24*  CREATININE 1.45* 1.40* 1.59*  CALCIUM 8.8* 8.4* 8.2*  MG  --   --  1.9  PROT  --  5.9*  --   ALBUMIN  --  3.1*  --   AST  --  22  --   ALT  --  18  --   ALKPHOS  --  95  --   BILITOT  --  0.3  --   GFRNONAA 48* 50* 43*  ANIONGAP '8 7 13    '$ Lipids No results for input(s): "CHOL", "TRIG", "HDL", "LABVLDL", "LDLCALC", "CHOLHDL" in the last 168 hours.  Hematology Recent Labs  Lab 08/07/22 2155 08/08/22 0317  WBC 8.2 9.0  RBC 4.30 3.80*  HGB 12.7* 11.1*  HCT 39.2 34.5*  MCV 91.2 90.8  MCH 29.5 29.2  MCHC 32.4 32.2  RDW 15.7* 15.6*  PLT 207 188   Thyroid  Recent Labs  Lab 08/09/22 0913  TSH 2.392  BNPNo results for input(s): "BNP", "PROBNP" in the last 168 hours.  DDimer No results for input(s): "DDIMER" in the last 168 hours.   Radiology    ECHOCARDIOGRAM COMPLETE  Result Date: 08/09/2022    ECHOCARDIOGRAM REPORT   Patient Name:   Kenneth [name] Date of Exam: 08/09/2022 Medical Rec #:  [id number]        Height:       65.0 in Accession #:    [id number]       Weight:       182.1 lb Date of Birth:  01-29-1941        BSA:          1.901 m Patient Age:    82 years         BP:           122/80 mmHg Patient Gender: M                HR:           81 bpm. Exam Location:  [facility] Procedure: 2D Echo, Cardiac Doppler, Color Doppler and Intracardiac            Opacification Agent Indications:    Atrial Fibrillation I48.91  History:        Patient has prior history of Echocardiogram examinations, most                 recent 12/18/2020. CAD and Previous Myocardial Infarction, Prior                 CABG, Stroke, Signs/Symptoms:Chest Pain; Risk                 Factors:Dyslipidemia, Hypertension and Current Smoker.  Sonographer:    Greer Pickerel Referring Phys: [id number] [place] [facility]  Sonographer Comments: Image acquisition challenging due to  respiratory motion. IMPRESSIONS  1. Left ventricular ejection fraction, by estimation, is 55 to 60%. The left ventricle has normal function. The left ventricle has no regional wall motion abnormalities. Left ventricular diastolic function could not be evaluated.  2. Right ventricular systolic function is mildly reduced. The right ventricular size is mildly enlarged.  3. The mitral valve is grossly normal. No evidence of mitral valve regurgitation. No evidence of mitral stenosis.  4. The aortic valve was not well visualized. There is mild calcification of the aortic valve. Aortic valve regurgitation is not visualized. No aortic stenosis is present.  5. Aortic dilatation noted. There is borderline dilatation of the aortic root, measuring 38 mm. Comparison(s): Changes from prior study are noted. LVEF improved from 45-50% in 08/2021 tio 55-60% now. FINDINGS  Left Ventricle: Left ventricular ejection fraction, by estimation, is 55 to 60%. The left ventricle has normal function. The left ventricle has no regional wall motion abnormalities. Definity contrast agent was given IV to delineate the left ventricular  endocardial borders. The left ventricular internal cavity size was normal in size. There is no left ventricular hypertrophy. Left ventricular diastolic function could not be evaluated due to atrial fibrillation. Left ventricular diastolic function could  not be evaluated. Right Ventricle: The right ventricular size is mildly enlarged. No increase in right ventricular wall thickness. Right ventricular systolic function is mildly reduced. Left Atrium: Left atrial size was normal in size. Right Atrium: Right atrial size was normal in size. Pericardium: There is no evidence of pericardial effusion. Mitral Valve: The mitral valve is grossly normal. No evidence of mitral valve regurgitation. No evidence of mitral valve stenosis. Tricuspid  Valve: The tricuspid valve is grossly normal. Tricuspid valve regurgitation is not  demonstrated. No evidence of tricuspid stenosis. Aortic Valve: The aortic valve was not well visualized. There is mild calcification of the aortic valve. There is mild aortic valve annular calcification. Aortic valve regurgitation is not visualized. No aortic stenosis is present. Pulmonic Valve: The pulmonic valve was not well visualized. Pulmonic valve regurgitation is not visualized. No evidence of pulmonic stenosis. Aorta: Aortic dilatation noted. There is borderline dilatation of the aortic root, measuring 38 mm. Venous: The inferior vena cava was not well visualized. IAS/Shunts: The interatrial septum was not well visualized.  LEFT VENTRICLE PLAX 2D LVIDd:         4.50 cm   Diastology LVIDs:         3.30 cm   LV e' medial:    6.68 cm/s LV PW:         1.10 cm   LV E/e' medial:  13.4 LV IVS:        0.90 cm   LV e' lateral:   13.40 cm/s LVOT diam:     2.00 cm   LV E/e' lateral: 6.7 LV SV:         52 LV SV Index:   27 LVOT Area:     3.14 cm  RIGHT VENTRICLE RV S prime:     10.10 cm/s TAPSE (M-mode): 1.2 cm LEFT ATRIUM         Index       RIGHT ATRIUM           Index LA diam:    4.10 cm 2.16 cm/m  RA Area:     17.70 cm                                 RA Volume:   46.20 ml  24.30 ml/m  AORTIC VALVE LVOT Vmax:   114.00 cm/s LVOT Vmean:  74.100 cm/s LVOT VTI:    0.165 m  AORTA Ao Root diam: 3.80 cm MITRAL VALVE MV Area (PHT): 4.77 cm    SHUNTS MV Decel Time: 159 msec    Systemic VTI:  0.16 m MV E velocity: 89.50 cm/s  Systemic Diam: 2.00 cm Vishnu Priya Mallipeddi Electronically signed by Lorelee Cover Mallipeddi Signature Date/Time: 08/09/2022/4:22:54 PM    Final     Cardiac Studies   Jan 2023 echo IMPRESSIONS     1. Left ventricular ejection fraction, by estimation, is 55 to 60%. The  left ventricle has normal function. The left ventricle has no regional  wall motion abnormalities. Left ventricular diastolic function could not  be evaluated.   2. Right ventricular systolic function is mildly reduced.  The right  ventricular size is mildly enlarged.   3. The mitral valve is grossly normal. No evidence of mitral valve  regurgitation. No evidence of mitral stenosis.   4. The aortic valve was not well visualized. There is mild calcification  of the aortic valve. Aortic valve regurgitation is not visualized. No  aortic stenosis is present.   5. Aortic dilatation noted. There is borderline dilatation of the aortic  root, measuring 38 mm.   Comparison(s): Changes from prior study are noted. LVEF improved from  45-50% in 08/2021 tio 55-60% now.   Patient Profile     Kenneth [name] is a 82 y.o. male with a history of CAD, ICM, chronic HFmrEF, HTN, HL, HTG, PAD, sinus bradycardia, carotid  dzs s/p stenting, CKD III, CVA x 3, remote tob abuse, and laryngeal cancer s/p trach, who is being seen today for the evaluation of Afib w/ RVR and chest pain at the request of Dr. Manuella Ghazi.   Assessment & Plan    1.Afib - new diagnosis this admission, presented with afib with RVR - initially started on IV dilt - started on eliqius - on dilt gtt at 5, coreg 6.'25mg'$  bid. Increase coreg to 9.'75mg'$  bid, wean dilt gtt to off this AM. If rates hold then d/c later today. Consider DCCV in 3 weeks if persistent afib.  2. CAD - History of CAD status post remote CABG in 2003 with multiple RCA stents since then related to both de novo disease and more recently in-stent restenosis.  - Most recent inferior STEMI in February 2023, at which time the mid RCA was treated with 3 drug-eluting stents  - had been on asa/brillinta after recent stenting, changed to plavix/eliquis in setting of new onset afib  3. HTN urgency - initially on IV NG on admission   Possible d/c later today if rates controlled of dilt gtt   For questions or updates, please contact [place] Please consult [email or web address] for contact info under        Signed, Carlyle Dolly, MD  08/10/2022, 8:14 AM

## 2022-08-11 LAB — CBC WITH DIFFERENTIAL/PLATELET
Abs Immature Granulocytes: 0.06 10*3/uL (ref 0.00–0.07)
Basophils Absolute: 0.1 10*3/uL (ref 0.0–0.1)
Basophils Relative: 1 %
Eosinophils Absolute: 0.3 10*3/uL (ref 0.0–0.5)
Eosinophils Relative: 3 %
HCT: 38 % — ABNORMAL LOW (ref 39.0–52.0)
Hemoglobin: 12.2 g/dL — ABNORMAL LOW (ref 13.0–17.0)
Immature Granulocytes: 1 %
Lymphocytes Relative: 21 %
Lymphs Abs: 2 10*3/uL (ref 0.7–4.0)
MCH: 29 pg (ref 26.0–34.0)
MCHC: 32.1 g/dL (ref 30.0–36.0)
MCV: 90.3 fL (ref 80.0–100.0)
Monocytes Absolute: 0.7 10*3/uL (ref 0.1–1.0)
Monocytes Relative: 8 %
Neutro Abs: 6.5 10*3/uL (ref 1.7–7.7)
Neutrophils Relative %: 66 %
Platelets: 232 10*3/uL (ref 150–400)
RBC: 4.21 MIL/uL — ABNORMAL LOW (ref 4.22–5.81)
RDW: 15.9 % — ABNORMAL HIGH (ref 11.5–15.5)
WBC: 9.6 10*3/uL (ref 4.0–10.5)
nRBC: 0 % (ref 0.0–0.2)

## 2022-08-11 NOTE — Progress Notes (Incomplete)
Rounding Note    Patient Name: Kenneth [name] Date of Encounter: 08/11/2022  [place] Cardiologist: Carlyle Dolly, MD ***  Subjective   ***  Inpatient Medications    Scheduled Meds:  apixaban  2.5 mg Oral BID   carvedilol  9.375 mg Oral BID WC   Chlorhexidine Gluconate Cloth  6 each Topical Daily   clopidogrel  75 mg Oral Daily   ezetimibe  10 mg Oral Daily   isosorbide mononitrate  30 mg Oral Daily   losartan  25 mg Oral Daily   pantoprazole  40 mg Oral Daily   PARoxetine  20 mg Oral QHS   spironolactone  25 mg Oral Daily   Continuous Infusions:  diltiazem (CARDIZEM) infusion Stopped (08/10/22 1009)   PRN Meds: acetaminophen **OR** acetaminophen, hydrALAZINE, HYDROmorphone (DILAUDID) injection, ondansetron **OR** ondansetron (ZOFRAN) IV, oxyCODONE   Vital Signs    Vitals:   08/11/22 0700 08/11/22 0756 08/11/22 0758 08/11/22 0800  BP: (!) 140/97 (!) 140/97  (!) 158/105  Pulse: (!) 49 (!) 106 97 (!) 51  Resp: (!) 40 (!) 22 20 (!) 24  Temp:   97.8 F (36.6 C)   TempSrc:   Oral   SpO2: 96% 97% 96% 97%  Weight:      Height:        Intake/Output Summary (Last 24 hours) at 08/11/2022 0812 Last data filed at 08/11/2022 0442 Gross per 24 hour  Intake 150 ml  Output 1000 ml  Net -850 ml      08/09/2022    6:45 PM 08/07/2022    9:23 PM 05/30/2022   10:35 AM  Last 3 Weights  Weight (lbs) 177 lb 11.1 oz 182 lb 1.6 oz 182 lb  Weight (kg) 80.6 kg 82.6 kg 82.555 kg      Telemetry    *** - Personally Reviewed  ECG    *** - Personally Reviewed  Physical Exam  *** GEN: No acute distress.   Neck: No JVD Cardiac: RRR, no murmurs, rubs, or gallops.  Respiratory: Clear to auscultation bilaterally. GI: Soft, nontender, non-distended  MS: No edema; No deformity. Neuro:  Nonfocal  Psych: Normal affect   Labs    High Sensitivity Troponin:   Recent Labs  Lab 08/07/22 2155 08/08/22 0035 08/08/22 0736 08/08/22 0944  TROPONINIHS 21* 23*  19* 18*     Chemistry Recent Labs  Lab 08/07/22 2155 08/08/22 0317 08/09/22 0913  NA 139 139 136  K 3.6 3.7 3.7  CL 107 108 103  CO2 24 24 20*  GLUCOSE 110* 124* 103*  BUN 14 16 24*  CREATININE 1.45* 1.40* 1.59*  CALCIUM 8.8* 8.4* 8.2*  MG  --   --  1.9  PROT  --  5.9*  --   ALBUMIN  --  3.1*  --   AST  --  22  --   ALT  --  18  --   ALKPHOS  --  95  --   BILITOT  --  0.3  --   GFRNONAA 48* 50* 43*  ANIONGAP '8 7 13    '$ Lipids No results for input(s): "CHOL", "TRIG", "HDL", "LABVLDL", "LDLCALC", "CHOLHDL" in the last 168 hours.  Hematology Recent Labs  Lab 08/07/22 2155 08/08/22 0317 08/11/22 0421  WBC 8.2 9.0 9.6  RBC 4.30 3.80* 4.21*  HGB 12.7* 11.1* 12.2*  HCT 39.2 34.5* 38.0*  MCV 91.2 90.8 90.3  MCH 29.5 29.2 29.0  MCHC 32.4 32.2 32.1  RDW 15.7*  15.6* 15.9*  PLT 207 188 232   Thyroid  Recent Labs  Lab 08/09/22 0913  TSH 2.392    BNPNo results for input(s): "BNP", "PROBNP" in the last 168 hours.  DDimer No results for input(s): "DDIMER" in the last 168 hours.   Radiology    ECHOCARDIOGRAM COMPLETE  Result Date: 08/09/2022    ECHOCARDIOGRAM REPORT   Patient Name:   Kenneth [name] Date of Exam: 08/09/2022 Medical Rec #:  [id number]        Height:       65.0 in Accession #:    [id number]       Weight:       182.1 lb Date of Birth:  21-Feb-1941        BSA:          1.901 m Patient Age:    82 years         BP:           122/80 mmHg Patient Gender: M                HR:           81 bpm. Exam Location:  [facility] Procedure: 2D Echo, Cardiac Doppler, Color Doppler and Intracardiac            Opacification Agent Indications:    Atrial Fibrillation I48.91  History:        Patient has prior history of Echocardiogram examinations, most                 recent 12/18/2020. CAD and Previous Myocardial Infarction, Prior                 CABG, Stroke, Signs/Symptoms:Chest Pain; Risk                 Factors:Dyslipidemia, Hypertension and Current Smoker.  Sonographer:    Greer Pickerel Referring Phys: [id number] [place] [facility]  Sonographer Comments: Image acquisition challenging due to respiratory motion. IMPRESSIONS  1. Left ventricular ejection fraction, by estimation, is 55 to 60%. The left ventricle has normal function. The left ventricle has no regional wall motion abnormalities. Left ventricular diastolic function could not be evaluated.  2. Right ventricular systolic function is mildly reduced. The right ventricular size is mildly enlarged.  3. The mitral valve is grossly normal. No evidence of mitral valve regurgitation. No evidence of mitral stenosis.  4. The aortic valve was not well visualized. There is mild calcification of the aortic valve. Aortic valve regurgitation is not visualized. No aortic stenosis is present.  5. Aortic dilatation noted. There is borderline dilatation of the aortic root, measuring 38 mm. Comparison(s): Changes from prior study are noted. LVEF improved from 45-50% in 08/2021 tio 55-60% now. FINDINGS  Left Ventricle: Left ventricular ejection fraction, by estimation, is 55 to 60%. The left ventricle has normal function. The left ventricle has no regional wall motion abnormalities. Definity contrast agent was given IV to delineate the left ventricular  endocardial borders. The left ventricular internal cavity size was normal in size. There is no left ventricular hypertrophy. Left ventricular diastolic function could not be evaluated due to atrial fibrillation. Left ventricular diastolic function could  not be evaluated. Right Ventricle: The right ventricular size is mildly enlarged. No increase in right ventricular wall thickness. Right ventricular systolic function is mildly reduced. Left Atrium: Left atrial size was normal in size. Right Atrium: Right atrial size was normal in size. Pericardium: There is no evidence of  pericardial effusion. Mitral Valve: The mitral valve is grossly normal. No evidence of mitral valve regurgitation. No evidence of mitral valve  stenosis. Tricuspid Valve: The tricuspid valve is grossly normal. Tricuspid valve regurgitation is not demonstrated. No evidence of tricuspid stenosis. Aortic Valve: The aortic valve was not well visualized. There is mild calcification of the aortic valve. There is mild aortic valve annular calcification. Aortic valve regurgitation is not visualized. No aortic stenosis is present. Pulmonic Valve: The pulmonic valve was not well visualized. Pulmonic valve regurgitation is not visualized. No evidence of pulmonic stenosis. Aorta: Aortic dilatation noted. There is borderline dilatation of the aortic root, measuring 38 mm. Venous: The inferior vena cava was not well visualized. IAS/Shunts: The interatrial septum was not well visualized.  LEFT VENTRICLE PLAX 2D LVIDd:         4.50 cm   Diastology LVIDs:         3.30 cm   LV e' medial:    6.68 cm/s LV PW:         1.10 cm   LV E/e' medial:  13.4 LV IVS:        0.90 cm   LV e' lateral:   13.40 cm/s LVOT diam:     2.00 cm   LV E/e' lateral: 6.7 LV SV:         52 LV SV Index:   27 LVOT Area:     3.14 cm  RIGHT VENTRICLE RV S prime:     10.10 cm/s TAPSE (M-mode): 1.2 cm LEFT ATRIUM         Index       RIGHT ATRIUM           Index LA diam:    4.10 cm 2.16 cm/m  RA Area:     17.70 cm                                 RA Volume:   46.20 ml  24.30 ml/m  AORTIC VALVE LVOT Vmax:   114.00 cm/s LVOT Vmean:  74.100 cm/s LVOT VTI:    0.165 m  AORTA Ao Root diam: 3.80 cm MITRAL VALVE MV Area (PHT): 4.77 cm    SHUNTS MV Decel Time: 159 msec    Systemic VTI:  0.16 m MV E velocity: 89.50 cm/s  Systemic Diam: 2.00 cm Vishnu Priya Mallipeddi Electronically signed by Lorelee Cover Mallipeddi Signature Date/Time: 08/09/2022/4:22:54 PM    Final     Cardiac Studies   ***  Patient Profile     82 y.o. male ***  Assessment & Plan    *** {Are we signing off today?:[id number]}  For questions or updates, please contact [place] Please consult [email or web address] for contact info  under        Signed, Carlyle Dolly, MD  08/11/2022, 8:12 AM

## 2022-08-11 NOTE — Progress Notes (Signed)
Pt assisted with getting dressed. Son and pt at bedside given and explained discharge paper work. Pt transported to son's car via wheelchair.

## 2022-08-15 ENCOUNTER — Emergency Department (HOSPITAL_COMMUNITY): Payer: Medicare HMO

## 2022-08-15 ENCOUNTER — Encounter (HOSPITAL_COMMUNITY): Payer: Self-pay

## 2022-08-15 ENCOUNTER — Other Ambulatory Visit: Payer: Self-pay

## 2022-08-15 ENCOUNTER — Inpatient Hospital Stay (HOSPITAL_COMMUNITY)
Admission: EM | Admit: 2022-08-15 | Discharge: 2022-08-19 | DRG: 035 | Disposition: A | Discharge status: Home / Self Care | Payer: Medicare HMO | Attending: Internal Medicine | Admitting: Internal Medicine

## 2022-08-15 DIAGNOSIS — I2510 Atherosclerotic heart disease of native coronary artery without angina pectoris: Secondary | ICD-10-CM | POA: Diagnosis not present

## 2022-08-15 DIAGNOSIS — Z8673 Personal history of transient ischemic attack (TIA), and cerebral infarction without residual deficits: Secondary | ICD-10-CM

## 2022-08-15 DIAGNOSIS — R29704 NIHSS score 4: Secondary | ICD-10-CM | POA: Diagnosis present

## 2022-08-15 DIAGNOSIS — Z8249 Family history of ischemic heart disease and other diseases of the circulatory system: Secondary | ICD-10-CM | POA: Diagnosis not present

## 2022-08-15 DIAGNOSIS — J398 Other specified diseases of upper respiratory tract: Secondary | ICD-10-CM | POA: Diagnosis present

## 2022-08-15 DIAGNOSIS — K219 Gastro-esophageal reflux disease without esophagitis: Secondary | ICD-10-CM | POA: Diagnosis present

## 2022-08-15 DIAGNOSIS — F418 Other specified anxiety disorders: Secondary | ICD-10-CM | POA: Diagnosis not present

## 2022-08-15 DIAGNOSIS — I4819 Other persistent atrial fibrillation: Secondary | ICD-10-CM | POA: Diagnosis present

## 2022-08-15 DIAGNOSIS — R531 Weakness: Principal | ICD-10-CM

## 2022-08-15 DIAGNOSIS — Z87891 Personal history of nicotine dependence: Secondary | ICD-10-CM | POA: Diagnosis not present

## 2022-08-15 DIAGNOSIS — Z7901 Long term (current) use of anticoagulants: Secondary | ICD-10-CM

## 2022-08-15 DIAGNOSIS — I63232 Cerebral infarction due to unspecified occlusion or stenosis of left carotid arteries: Secondary | ICD-10-CM

## 2022-08-15 DIAGNOSIS — F419 Anxiety disorder, unspecified: Secondary | ICD-10-CM | POA: Diagnosis present

## 2022-08-15 DIAGNOSIS — Z923 Personal history of irradiation: Secondary | ICD-10-CM

## 2022-08-15 DIAGNOSIS — E782 Mixed hyperlipidemia: Secondary | ICD-10-CM | POA: Diagnosis present

## 2022-08-15 DIAGNOSIS — Z951 Presence of aortocoronary bypass graft: Secondary | ICD-10-CM

## 2022-08-15 DIAGNOSIS — Z881 Allergy status to other antibiotic agents status: Secondary | ICD-10-CM | POA: Diagnosis not present

## 2022-08-15 DIAGNOSIS — Z955 Presence of coronary angioplasty implant and graft: Secondary | ICD-10-CM | POA: Diagnosis not present

## 2022-08-15 DIAGNOSIS — I129 Hypertensive chronic kidney disease with stage 1 through stage 4 chronic kidney disease, or unspecified chronic kidney disease: Secondary | ICD-10-CM | POA: Diagnosis present

## 2022-08-15 DIAGNOSIS — Z79899 Other long term (current) drug therapy: Secondary | ICD-10-CM

## 2022-08-15 DIAGNOSIS — G8321 Monoplegia of upper limb affecting right dominant side: Secondary | ICD-10-CM | POA: Diagnosis present

## 2022-08-15 DIAGNOSIS — Z880 Allergy status to penicillin: Secondary | ICD-10-CM | POA: Diagnosis not present

## 2022-08-15 DIAGNOSIS — Z7902 Long term (current) use of antithrombotics/antiplatelets: Secondary | ICD-10-CM | POA: Diagnosis not present

## 2022-08-15 DIAGNOSIS — Z882 Allergy status to sulfonamides status: Secondary | ICD-10-CM | POA: Diagnosis not present

## 2022-08-15 DIAGNOSIS — T82856D Stenosis of peripheral vascular stent, subsequent encounter: Secondary | ICD-10-CM

## 2022-08-15 DIAGNOSIS — Z83438 Family history of other disorder of lipoprotein metabolism and other lipidemia: Secondary | ICD-10-CM

## 2022-08-15 DIAGNOSIS — I639 Cerebral infarction, unspecified: Secondary | ICD-10-CM | POA: Diagnosis not present

## 2022-08-15 DIAGNOSIS — F32A Depression, unspecified: Secondary | ICD-10-CM | POA: Diagnosis present

## 2022-08-15 DIAGNOSIS — I10 Essential (primary) hypertension: Secondary | ICD-10-CM | POA: Diagnosis not present

## 2022-08-15 DIAGNOSIS — D649 Anemia, unspecified: Secondary | ICD-10-CM | POA: Diagnosis not present

## 2022-08-15 DIAGNOSIS — Z9861 Coronary angioplasty status: Secondary | ICD-10-CM

## 2022-08-15 DIAGNOSIS — I779 Disorder of arteries and arterioles, unspecified: Secondary | ICD-10-CM | POA: Diagnosis present

## 2022-08-15 DIAGNOSIS — I4510 Unspecified right bundle-branch block: Secondary | ICD-10-CM | POA: Diagnosis present

## 2022-08-15 DIAGNOSIS — Z888 Allergy status to other drugs, medicaments and biological substances status: Secondary | ICD-10-CM | POA: Diagnosis not present

## 2022-08-15 DIAGNOSIS — Z930 Tracheostomy status: Secondary | ICD-10-CM

## 2022-08-15 DIAGNOSIS — M545 Low back pain, unspecified: Secondary | ICD-10-CM | POA: Diagnosis present

## 2022-08-15 DIAGNOSIS — Z87442 Personal history of urinary calculi: Secondary | ICD-10-CM

## 2022-08-15 DIAGNOSIS — I63412 Cerebral infarction due to embolism of left middle cerebral artery: Principal | ICD-10-CM | POA: Diagnosis present

## 2022-08-15 DIAGNOSIS — I6523 Occlusion and stenosis of bilateral carotid arteries: Secondary | ICD-10-CM | POA: Diagnosis present

## 2022-08-15 DIAGNOSIS — G8929 Other chronic pain: Secondary | ICD-10-CM | POA: Diagnosis present

## 2022-08-15 DIAGNOSIS — Z8521 Personal history of malignant neoplasm of larynx: Secondary | ICD-10-CM

## 2022-08-15 DIAGNOSIS — G459 Transient cerebral ischemic attack, unspecified: Secondary | ICD-10-CM

## 2022-08-15 DIAGNOSIS — N1831 Chronic kidney disease, stage 3a: Secondary | ICD-10-CM | POA: Diagnosis present

## 2022-08-15 DIAGNOSIS — I252 Old myocardial infarction: Secondary | ICD-10-CM | POA: Diagnosis not present

## 2022-08-15 DIAGNOSIS — C329 Malignant neoplasm of larynx, unspecified: Secondary | ICD-10-CM | POA: Diagnosis not present

## 2022-08-15 DIAGNOSIS — I6522 Occlusion and stenosis of left carotid artery: Secondary | ICD-10-CM | POA: Diagnosis not present

## 2022-08-15 DIAGNOSIS — Z947 Corneal transplant status: Secondary | ICD-10-CM

## 2022-08-15 LAB — CBC
HCT: 38.6 % — ABNORMAL LOW (ref 39.0–52.0)
Hemoglobin: 12.2 g/dL — ABNORMAL LOW (ref 13.0–17.0)
MCH: 29 pg (ref 26.0–34.0)
MCHC: 31.6 g/dL (ref 30.0–36.0)
MCV: 91.7 fL (ref 80.0–100.0)
Platelets: 323 10*3/uL (ref 150–400)
RBC: 4.21 MIL/uL — ABNORMAL LOW (ref 4.22–5.81)
RDW: 15.8 % — ABNORMAL HIGH (ref 11.5–15.5)
WBC: 9.1 10*3/uL (ref 4.0–10.5)
nRBC: 0 % (ref 0.0–0.2)

## 2022-08-15 LAB — COMPREHENSIVE METABOLIC PANEL
ALT: 22 U/L (ref 0–44)
AST: 23 U/L (ref 15–41)
Albumin: 3.6 g/dL (ref 3.5–5.0)
Alkaline Phosphatase: 103 U/L (ref 38–126)
Anion gap: 9 (ref 5–15)
BUN: 22 mg/dL (ref 8–23)
CO2: 24 mmol/L (ref 22–32)
Calcium: 8.7 mg/dL — ABNORMAL LOW (ref 8.9–10.3)
Chloride: 101 mmol/L (ref 98–111)
Creatinine, Ser: 1.6 mg/dL — ABNORMAL HIGH (ref 0.61–1.24)
GFR, Estimated: 42 mL/min — ABNORMAL LOW (ref >60)
Glucose, Bld: 125 mg/dL — ABNORMAL HIGH (ref 70–99)
Potassium: 4.3 mmol/L (ref 3.5–5.1)
Sodium: 134 mmol/L — ABNORMAL LOW (ref 135–145)
Total Bilirubin: 0.4 mg/dL (ref 0.3–1.2)
Total Protein: 7.2 g/dL (ref 6.5–8.1)

## 2022-08-15 LAB — DIFFERENTIAL
Abs Immature Granulocytes: 0.07 10*3/uL (ref 0.00–0.07)
Basophils Absolute: 0.1 10*3/uL (ref 0.0–0.1)
Basophils Relative: 1 %
Eosinophils Absolute: 0.2 10*3/uL (ref 0.0–0.5)
Eosinophils Relative: 2 %
Immature Granulocytes: 1 %
Lymphocytes Relative: 21 %
Lymphs Abs: 1.9 10*3/uL (ref 0.7–4.0)
Monocytes Absolute: 0.6 10*3/uL (ref 0.1–1.0)
Monocytes Relative: 7 %
Neutro Abs: 6.3 10*3/uL (ref 1.7–7.7)
Neutrophils Relative %: 68 %

## 2022-08-15 LAB — URINALYSIS, ROUTINE W REFLEX MICROSCOPIC
Bacteria, UA: NONE SEEN
Bilirubin Urine: NEGATIVE
Glucose, UA: NEGATIVE mg/dL
Ketones, ur: NEGATIVE mg/dL
Nitrite: NEGATIVE
Protein, ur: NEGATIVE mg/dL
Specific Gravity, Urine: 1.02 (ref 1.005–1.030)
pH: 6 (ref 5.0–8.0)

## 2022-08-15 LAB — RAPID URINE DRUG SCREEN, HOSP PERFORMED
Amphetamines: NOT DETECTED
Barbiturates: NOT DETECTED
Benzodiazepines: NOT DETECTED
Cocaine: NOT DETECTED
Opiates: NOT DETECTED
Tetrahydrocannabinol: NOT DETECTED

## 2022-08-15 LAB — CBG MONITORING, ED: Glucose-Capillary: 139 mg/dL — ABNORMAL HIGH (ref 70–99)

## 2022-08-15 LAB — I-STAT CHEM 8, ED
BUN: 22 mg/dL (ref 8–23)
Calcium, Ion: 1.17 mmol/L (ref 1.15–1.40)
Chloride: 102 mmol/L (ref 98–111)
Creatinine, Ser: 1.6 mg/dL — ABNORMAL HIGH (ref 0.61–1.24)
Glucose, Bld: 127 mg/dL — ABNORMAL HIGH (ref 70–99)
HCT: 39 % (ref 39.0–52.0)
Hemoglobin: 13.3 g/dL (ref 13.0–17.0)
Potassium: 4.5 mmol/L (ref 3.5–5.1)
Sodium: 137 mmol/L (ref 135–145)
TCO2: 23 mmol/L (ref 22–32)

## 2022-08-15 LAB — PROTIME-INR
INR: 1.2 (ref 0.8–1.2)
Prothrombin Time: 14.8 seconds (ref 11.4–15.2)

## 2022-08-15 LAB — APTT: aPTT: 28 seconds (ref 24–36)

## 2022-08-15 LAB — ETHANOL: Alcohol, Ethyl (B): <10 mg/dL (ref <10)

## 2022-08-15 MED ORDER — IOHEXOL 350 MG/ML SOLN
100.0000 mL | Freq: Once | INTRAVENOUS | Status: AC | PRN
  Administered 2022-08-15: 100 mL via INTRAVENOUS

## 2022-08-15 MED ORDER — ACETAMINOPHEN 160 MG/5ML PO SOLN: 650.0000 mg | ORAL | Status: DC | PRN

## 2022-08-15 MED ORDER — EZETIMIBE 10 MG PO TABS
10.0000 mg | ORAL_TABLET | Freq: Every day | ORAL | Status: DC
  Administered 2022-08-16 – 2022-08-19 (×3): 10 mg via ORAL
  Filled 2022-08-15 (×3): qty 1

## 2022-08-15 MED ORDER — STROKE: EARLY STAGES OF RECOVERY BOOK
Freq: Once | Status: AC
  Filled 2022-08-15: qty 1

## 2022-08-15 MED ORDER — PAROXETINE HCL 20 MG PO TABS
20.0000 mg | ORAL_TABLET | Freq: Every day | ORAL | Status: DC
  Administered 2022-08-16 – 2022-08-19 (×3): 20 mg via ORAL
  Filled 2022-08-15 (×4): qty 1

## 2022-08-15 MED ORDER — ACETAMINOPHEN 650 MG RE SUPP: 650.0000 mg | RECTAL | Status: DC | PRN

## 2022-08-15 MED ORDER — SODIUM CHLORIDE 0.9 % IV BOLUS
1000.0000 mL | Freq: Once | INTRAVENOUS | Status: AC
  Administered 2022-08-15: 1000 mL via INTRAVENOUS

## 2022-08-15 MED ORDER — PANTOPRAZOLE SODIUM 40 MG PO TBEC
40.0000 mg | DELAYED_RELEASE_TABLET | Freq: Every day | ORAL | Status: DC
  Administered 2022-08-16 – 2022-08-19 (×3): 40 mg via ORAL
  Filled 2022-08-15 (×3): qty 1

## 2022-08-15 MED ORDER — METOPROLOL TARTRATE 5 MG/5ML IV SOLN
2.5000 mg | Freq: Once | INTRAVENOUS | Status: AC
  Administered 2022-08-15: 2.5 mg via INTRAVENOUS
  Filled 2022-08-15: qty 5

## 2022-08-15 MED ORDER — HEPARIN (PORCINE) 25000 UT/250ML-% IV SOLN
1400.0000 [IU]/h | INTRAVENOUS | Status: DC
  Administered 2022-08-15: 900 [IU]/h via INTRAVENOUS
  Administered 2022-08-16: 1300 [IU]/h via INTRAVENOUS
  Filled 2022-08-15 (×2): qty 250

## 2022-08-15 MED ORDER — SENNOSIDES-DOCUSATE SODIUM 8.6-50 MG PO TABS: 1.0000 | ORAL_TABLET | Freq: Every evening | ORAL | Status: DC | PRN

## 2022-08-15 MED ORDER — ACETAMINOPHEN 325 MG PO TABS: 650.0000 mg | ORAL_TABLET | ORAL | Status: DC | PRN

## 2022-08-15 MED ORDER — SODIUM CHLORIDE 0.9 % IV SOLN: INTRAVENOUS | Status: DC

## 2022-08-15 MED ORDER — CLOPIDOGREL BISULFATE 75 MG PO TABS
75.0000 mg | ORAL_TABLET | Freq: Every day | ORAL | Status: DC
  Administered 2022-08-16 – 2022-08-19 (×3): 75 mg via ORAL
  Filled 2022-08-15 (×4): qty 1

## 2022-08-15 NOTE — Progress Notes (Addendum)
Pt was seen again at [facility]. His son is at the beside. Pt awake alert but in Afib RVR, just had in and out for urinary retention. On exam, he is awake, alert, eyes open, orientated to age, place, time and people. No aphasia but limited speech and soft voice due to trach,  but following all simple commands. No gaze palsy, tracking bilaterally, visual field exam concerning for right lower quadrantanopia (not sure if new or old and pt can not tell). No facial droop. Tongue midline. Bilateral UEs 5/5, no drift, right hand slight weaker on finger grip than left with mild dexterity difficulty. Bilaterally LEs 5/5, no drift. Sensation symmetrical bilaterally, b/l FTN intact, gait not tested.   Pt right hand weakness improved and numbness resolved. Currently BP 130s-140s. On IVF. Recommend internal medicine admission, continue bed rest, and head of bed flat. BP goal 140-160 if able. Will start heparin IV this evening per pharmacy. Once pass swallow, will continue plavix and zetia. Discussed with Dr. Trula Slade over the phone. We will follow closely.   Rosalin Hawking, MD PhD Stroke Neurology 08/15/2022 5:47 PM

## 2022-08-15 NOTE — ED Notes (Signed)
Patient has returned to room at this time from CT.

## 2022-08-15 NOTE — ED Notes (Signed)
Per Neuro RN, patient will not receive TNK due to taking Eliquis dose this am.

## 2022-08-15 NOTE — ED Notes (Signed)
Code stroke paged and CT notified.

## 2022-08-15 NOTE — ED Notes (Signed)
Called and gave report to ED charge Roselyn Reef, [place].

## 2022-08-15 NOTE — ED Triage Notes (Signed)
Patient to ED with reports of right hand numbness/weakness with visual changes in left eye that started yesterday before lunch per wife around 11-12. States that he has not been acting himself and his balance is off but again happened yesterday before lunch. Recent hospitalization with med changes for HTN/Afib. Wife states that he could not lift food earlier with utensils. MD aware of patient symptoms and states that he will evaluate patient.Left grip stronger than right. Currently takes Eliquis.

## 2022-08-15 NOTE — ED Notes (Signed)
ED TO INPATIENT HANDOFF REPORT  ED Nurse Name and Phone #:  Theadora Rama [phone]  S Name/Age/Gender Kenneth [name] 82 y.o. male Room/Bed: RESUSC/RESUSC  Code Status   Code Status: Full Code  Home/SNF/Other Home Patient oriented to: self, place, time, and situation Is this baseline? Yes   Triage Complete: Triage complete  Chief Complaint Acute CVA (cerebrovascular accident) [facility]) [I63.9]  Triage Note Patient to ED with reports of right hand numbness/weakness with visual changes in left eye that started yesterday before lunch per wife around 11-12. States that he has not been acting himself and his balance is off but again happened yesterday before lunch. Recent hospitalization with med changes for HTN/Afib. Wife states that he could not lift food earlier with utensils. MD aware of patient symptoms and states that he will evaluate patient.Left grip stronger than right. Currently takes Eliquis.    Allergies Allergies  Allergen Reactions   Nifedical Xl [Nifedipine] Other (See Comments)    Unknown reaction   Ativan [Lorazepam] Other (See Comments)    Altered mental state   Penicillins Hives    Has patient had a PCN reaction causing immediate rash, facial/tongue/throat swelling, SOB or lightheadedness with hypotension: Yes Has patient had a PCN reaction causing severe rash involving mucus membranes or skin necrosis: No Has patient had a PCN reaction that required hospitalization No Has patient had a PCN reaction occurring within the last 10 years: No If all of the above answers are "NO", then may proceed with Cephalosporin use.  HAS TOLERATED: cephalexin   Sulfacetamide Other (See Comments)    Unknown reaction   Sulfonamide Derivatives Other (See Comments)    Unknown reaction   Crestor [Rosuvastatin] Itching, Swelling and Rash   Erythromycin Rash   Firvanq [Vancomycin] Itching   Statins Itching, Swelling and Rash    Rash on his trunk and arms, swelling in his lips    Level  of Care/Admitting Diagnosis ED Disposition     ED Disposition  Admit   Condition  --   [place]: [place] [[id number]]  Level of Care: Progressive [102]  Admit to Progressive based on following criteria: CARDIOVASCULAR & THORACIC of moderate stability with acute coronary syndrome symptoms/low risk myocardial infarction/hypertensive urgency/arrhythmias/heart failure potentially compromising stability and stable post cardiovascular intervention patients.  Admit to Progressive based on following criteria: NEUROLOGICAL AND NEUROSURGICAL complex patients with significant risk of instability, who do not meet ICU criteria, yet require close observation or frequent assessment (< / = every 2 - 4 hours) with medical / nursing intervention.  May admit patient to [facility] or [facility] if equivalent level of care is available:: No  Covid Evaluation: Asymptomatic - no recent exposure (last 10 days) testing not required  Diagnosis: Acute CVA (cerebrovascular accident) [facility]) [[id number]]  Admitting Physician: Marcelyn Bruins [[id number]]  Attending Physician: Marcelyn Bruins [[id number]]  Certification:: I certify this patient will need inpatient services for at least 2 midnights  Estimated Length of Stay: 3          B Medical/Surgery History Past Medical History:  Diagnosis Date   Acute on chronic respiratory failure with hypoxia ([place]) 09/06/2018   Acute ST elevation myocardial infarction (STEMI) of inferior wall ([place]) 03/01/2020   [facility]   Anxiety    Arthritis    Back (05/04/2018)   Carotid artery disease ([place])    a. 04/2018 s/p R carotid stenting.   Chronic lower back pain    "have  it at night" (05/04/2018)   CKD (chronic kidney disease), stage III ([place])    Coronary artery disease    s/p CABG in 2003, patent grafts by cath in 2007, s/p inferior STEMI in 02/2020 with DES to RCA, s/p NSTEMI in 05/2020 with DES to proximal-RCA, s/p  NSTEMI in 11/2020 with balloon angioplasty to mid-RCA and STEMI in 08/2021 with DESx3 to RCA   CVA (cerebral vascular accident) ([place]) 10/2017   "little numb on my left face since" (05/04/2018)   Depression    GERD (gastroesophageal reflux disease)    High triglycerides    History of kidney stones    Hyperlipidemia LDL goal <70    Hypertension    Hypertensive crisis 08/08/2022   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   Peripheral vascular disease ([place])    Pneumonia 11/11/2021   Sinus bradycardia    Stroke [facility])    "he's had several little strokes; many that he wasn't aware of" (05/04/2018)   Tobacco abuse    Tracheal stenosis    Past Surgical History:  Procedure Laterality Date   CAROTID STENT Right 06-13-12; 05/04/2018   CAROTID STENT INSERTION N/A 06/13/2012   Procedure: CAROTID STENT INSERTION;  Surgeon: Serafina Mitchell, MD;  Location: [place] CATH LAB;  Service: Cardiovascular;  Laterality: N/A;   CATARACT EXTRACTION, BILATERAL Bilateral    COLONOSCOPY  11/10/2011   Dr. [name]: hemorrhoids, tubular adenoma   CORNEAL TRANSPLANT Bilateral    CORONARY ARTERY BYPASS GRAFT  ~ 2003   "CABG X3"   CORONARY BALLOON ANGIOPLASTY N/A 11/26/2020   Procedure: CORONARY BALLOON ANGIOPLASTY;  Surgeon: Troy Sine, MD;  Location: [place] CV LAB;  Service: Cardiovascular;  Laterality: N/A;   CORONARY STENT INTERVENTION N/A 06/05/2020   Procedure: CORONARY STENT INTERVENTION;  Surgeon: Lorretta Harp, MD;  Location: [place] CV LAB;  Service: Cardiovascular;  Laterality: N/A;   EYE SURGERY     INSERTION OF RETROGRADE CAROTID STENT Right 05/04/2018   Procedure: INSERTION OF RIGHT  CAROTID STENT using an ABBOT- XACT carotid stent system;  Surgeon: Serafina Mitchell, MD;  Location: [place];  Service: Vascular;  Laterality: Right;   LAPAROSCOPIC CHOLECYSTECTOMY  2007   LEFT HEART CATH AND CORS/GRAFTS ANGIOGRAPHY N/A 10/07/2016   Procedure: Left Heart Cath and Cors/Grafts Angiography;  Surgeon: Troy Sine, MD;  Location: [place] CV LAB;  Service: Cardiovascular;  Laterality: N/A;   LEFT HEART CATH AND CORS/GRAFTS ANGIOGRAPHY N/A 06/05/2020   Procedure: LEFT HEART CATH AND CORS/GRAFTS ANGIOGRAPHY;  Surgeon: Lorretta Harp, MD;  Location: [place] CV LAB;  Service: Cardiovascular;  Laterality: N/A;   LEFT HEART CATH AND CORS/GRAFTS ANGIOGRAPHY N/A 11/26/2020   Procedure: LEFT HEART CATH AND CORS/GRAFTS ANGIOGRAPHY;  Surgeon: Troy Sine, MD;  Location: [place] CV LAB;  Service: Cardiovascular;  Laterality: N/A;   PARTIAL LARYNGECTOMY  1998   TRACHEOSTOMY  03/13/2018   "@ [place]"     A IV Location/Drains/Wounds Patient Lines/Drains/Airways Status     Active Line/Drains/Airways     Name Placement date Placement time Site Days   Peripheral IV 08/15/22 18 G Right Antecubital 08/15/22  1408  Antecubital  less than 1   Peripheral IV 08/15/22 20 G Left Antecubital 08/15/22  1243  Antecubital  less than 1   Tracheostomy 6 mm Uncuffed --  --  6 mm  --            Intake/Output Last 24 hours  Intake/Output Summary (Last 24 hours) at  08/15/2022 2045 Last data filed at 08/15/2022 1719 Gross per 24 hour  Intake --  Output 700 ml  Net -700 ml    Labs/Imaging Results for orders placed or performed during the hospital encounter of 08/15/22 (from the past 48 hour(s))  CBG monitoring, ED     Status: Abnormal   Collection Time: 08/15/22 12:50 PM  Result Value Ref Range   Glucose-Capillary 139 (H) 70 - 99 mg/dL    Comment: Glucose reference range applies only to samples taken after fasting for at least 8 hours.  Ethanol     Status: None   Collection Time: 08/15/22  1:04 PM  Result Value Ref Range   Alcohol, Ethyl (B) <10 <10 mg/dL    Comment: (NOTE) Lowest detectable limit for serum alcohol is 10 mg/dL.  For medical purposes only. Performed at [facility], [address] [place], [place] [address]   Protime-INR     Status: None   Collection Time: 08/15/22  1:04 PM   Result Value Ref Range   Prothrombin Time 14.8 11.4 - 15.2 seconds   INR 1.2 0.8 - 1.2    Comment: (NOTE) INR goal varies based on device and disease states. Performed at [facility], [address], [place], [place] [address]   APTT     Status: None   Collection Time: 08/15/22  1:04 PM  Result Value Ref Range   aPTT 28 24 - 36 seconds    Comment: Performed at [facility], [address], [place], [place] [address]  CBC     Status: Abnormal   Collection Time: 08/15/22  1:04 PM  Result Value Ref Range   WBC 9.1 4.0 - 10.5 K/uL   RBC 4.21 (L) 4.22 - 5.81 MIL/uL   Hemoglobin 12.2 (L) 13.0 - 17.0 g/dL   HCT 38.6 (L) 39.0 - 52.0 %   MCV 91.7 80.0 - 100.0 fL   MCH 29.0 26.0 - 34.0 pg   MCHC 31.6 30.0 - 36.0 g/dL   RDW 15.8 (H) 11.5 - 15.5 %   Platelets 323 150 - 400 K/uL   nRBC 0.0 0.0 - 0.2 %    Comment: Performed at [facility], [address], [place], [place] [address]  Differential     Status: None   Collection Time: 08/15/22  1:04 PM  Result Value Ref Range   Neutrophils Relative % 68 %   Neutro Abs 6.3 1.7 - 7.7 K/uL   Lymphocytes Relative 21 %   Lymphs Abs 1.9 0.7 - 4.0 K/uL   Monocytes Relative 7 %   Monocytes Absolute 0.6 0.1 - 1.0 K/uL   Eosinophils Relative 2 %   Eosinophils Absolute 0.2 0.0 - 0.5 K/uL   Basophils Relative 1 %   Basophils Absolute 0.1 0.0 - 0.1 K/uL   Immature Granulocytes 1 %   Abs Immature Granulocytes 0.07 0.00 - 0.07 K/uL    Comment: Performed at [facility], [address] [place], [place] [address]  Comprehensive metabolic panel     Status: Abnormal   Collection Time: 08/15/22  1:04 PM  Result Value Ref Range   Sodium 134 (L) 135 - 145 mmol/L   Potassium 4.3 3.5 - 5.1 mmol/L   Chloride 101 98 - 111 mmol/L   CO2 24 22 - 32 mmol/L   Glucose, Bld 125 (H) 70 - 99 mg/dL    Comment: Glucose reference range applies only to samples taken after fasting for at least 8 hours.   BUN 22 8 -  23 mg/dL   Creatinine, Ser 1.60 (H) 0.61 - 1.24 mg/dL    Calcium 8.7 (L) 8.9 - 10.3 mg/dL   Total Protein 7.2 6.5 - 8.1 g/dL   Albumin 3.6 3.5 - 5.0 g/dL   AST 23 15 - 41 U/L   ALT 22 0 - 44 U/L   Alkaline Phosphatase 103 38 - 126 U/L   Total Bilirubin 0.4 0.3 - 1.2 mg/dL   GFR, Estimated 42 (L) >60 mL/min    Comment: (NOTE) Calculated using the CKD-EPI Creatinine Equation (2021)    Anion gap 9 5 - 15    Comment: Performed at [facility], [address], [place], [place] [address]  I-stat chem 8, ED     Status: Abnormal   Collection Time: 08/15/22  1:12 PM  Result Value Ref Range   Sodium 137 135 - 145 mmol/L   Potassium 4.5 3.5 - 5.1 mmol/L   Chloride 102 98 - 111 mmol/L   BUN 22 8 - 23 mg/dL   Creatinine, Ser 1.60 (H) 0.61 - 1.24 mg/dL   Glucose, Bld 127 (H) 70 - 99 mg/dL    Comment: Glucose reference range applies only to samples taken after fasting for at least 8 hours.   Calcium, Ion 1.17 1.15 - 1.40 mmol/L   TCO2 23 22 - 32 mmol/L   Hemoglobin 13.3 13.0 - 17.0 g/dL   HCT 39.0 39.0 - 52.0 %  Urine rapid drug screen (hosp performed)     Status: None   Collection Time: 08/15/22  4:58 PM  Result Value Ref Range   Opiates NONE DETECTED NONE DETECTED   Cocaine NONE DETECTED NONE DETECTED   Benzodiazepines NONE DETECTED NONE DETECTED   Amphetamines NONE DETECTED NONE DETECTED   Tetrahydrocannabinol NONE DETECTED NONE DETECTED   Barbiturates NONE DETECTED NONE DETECTED    Comment: (NOTE) DRUG SCREEN FOR MEDICAL PURPOSES ONLY.  IF CONFIRMATION IS NEEDED FOR ANY PURPOSE, NOTIFY LAB WITHIN 5 DAYS.  LOWEST DETECTABLE LIMITS FOR URINE DRUG SCREEN Drug Class                     Cutoff (ng/mL) Amphetamine and metabolites    1000 Barbiturate and metabolites    200 Benzodiazepine                 200 Opiates and metabolites        300 Cocaine and metabolites        300 THC                            50 Performed at [facility], [place] [address], [place], [place] [address]   Urinalysis, Routine w reflex microscopic Urine, In &  Out Cath     Status: Abnormal   Collection Time: 08/15/22  4:58 PM  Result Value Ref Range   Color, Urine STRAW (A) YELLOW   APPearance CLEAR CLEAR   Specific Gravity, Urine 1.020 1.005 - 1.030   pH 6.0 5.0 - 8.0   Glucose, UA NEGATIVE NEGATIVE mg/dL   Hgb urine dipstick SMALL (A) NEGATIVE   Bilirubin Urine NEGATIVE NEGATIVE   Ketones, ur NEGATIVE NEGATIVE mg/dL   Protein, ur NEGATIVE NEGATIVE mg/dL   Nitrite NEGATIVE NEGATIVE   Leukocytes,Ua TRACE (A) NEGATIVE   RBC / HPF 6-10 0 - 5 RBC/hpf   WBC, UA 0-5 0 - 5 WBC/hpf   Bacteria, UA NONE SEEN NONE SEEN  Squamous Epithelial / HPF 0-5 0 - 5 /HPF    Comment: Performed at [facility], [place] [address], [place], [place] [address]   CT ANGIO HEAD NECK W WO CM W PERF (CODE STROKE)  Result Date: 08/15/2022 CLINICAL DATA:  Neuro deficit, acute, stroke suspected EXAM: CT ANGIOGRAPHY HEAD AND NECK CT PERFUSION BRAIN TECHNIQUE: Multidetector CT imaging of the head and neck was performed using the standard protocol during bolus administration of intravenous contrast. Multiplanar CT image reconstructions and MIPs were obtained to evaluate the vascular anatomy. Carotid stenosis measurements (when applicable) are obtained utilizing NASCET criteria, using the distal internal carotid diameter as the denominator. Multiphase CT imaging of the brain was performed following IV bolus contrast injection. Subsequent parametric perfusion maps were calculated using RAPID software. RADIATION DOSE REDUCTION: This exam was performed according to the departmental dose-optimization program which includes automated exposure control, adjustment of the mA and/or kV according to patient size and/or use of iterative reconstruction technique. CONTRAST:  192m OMNIPAQUE IOHEXOL 350 MG/ML SOLN COMPARISON:  None Available. Same day CT head.  CTA head/neck January 20, 2021. FINDINGS: CTA NECK FINDINGS Aortic arch: Great vessel origins are patent. Right carotid system: Stenting of  the common carotid artery and the proximal internal carotid artery. The stent is patent. There is moderate (approximately 60-70% stenosis in the common carotid stent). Left carotid system: Common carotid artery is patent. Critical stenosis of the left proximal ICA in the neck with trace (approximately 1 mm diameter) luminal opacification. Findings are mildly progressed from the prior. There is opacification of the more distal ICA in the neck. Vertebral arteries: Patent bilaterally. No evidence of hemodynamically significant stenosis in the neck. Skeleton: Multilevel degenerative change. No acute findings on limited assessment. Other neck: No acute findings on limited assessment. A dedicated CT neck could better assess for malignancy. Upper chest: Visualized lung apices are largely clear with evidence of expiratory changes. Review of the MIP images confirms the above findings CTA HEAD FINDINGS Anterior circulation: Bilateral intracranial ICAs are patent. Moderate stenosis of the right cavernous and paraclinoid ICA. Bilateral MCAs and ACAs are patent. Posterior circulation: Chronically severely stenotic versus occluded right intradural vertebral artery. Left intradural vertebral artery is patent with moderate stenosis. Basilar artery and bilateral posterior cerebral arteries are patent. Mild right and moderate left P2 PCA stenosis. Venous sinuses: As permitted by contrast timing, patent. Review of the MIP images confirms the above findings CT Brain Perfusion Findings: ASPECTS: 10 CBF (<30%) Volume: 067mPerfusion (Tmax>6.0s) volume: 12715mismatch Volume: 127m75mfarction Location:None identified. IMPRESSION: 1. Critical stenosis of the left proximal ICA in the neck with trace (approximately 1 mm diameter) luminal opacification. Findings are mildly progressed from the prior. 2. On CT perfusion, approximately 127 mL of the reported penumbra in the left cerebrum which likely relates to the above. No core infarct  identified. 3. Right common carotid artery and internal carotid artery stent. Moderate to severe stenosis of the common carotid artery stent with approximately 60-70% stenosis. A catheter arteriogram could more accurately quantify/characterize if clinically warranted. 4. 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 5. Moderate right intracranial ICA stenosis. 6. Moderate left and mild right P2 PCA stenosis. Findings discussed with Dr. Xu vErlinda Hong telephone at 2:35 p.m. Electronically Signed   By: FredMargaretha Sheffield.   On: 08/15/2022 14:40   CT HEAD CODE STROKE WO CONTRAST  Result Date: 08/15/2022 CLINICAL DATA:  Code stroke.  Neuro deficit, acute, stroke suspected EXAM: CT HEAD  WITHOUT CONTRAST TECHNIQUE: Contiguous axial images were obtained from the base of the skull through the vertex without intravenous contrast. RADIATION DOSE REDUCTION: This exam was performed according to the departmental dose-optimization program which includes automated exposure control, adjustment of the mA and/or kV according to patient size and/or use of iterative reconstruction technique. COMPARISON:  January 20, 2021. FINDINGS: Brain: No evidence of acute large vascular territory infarction, hemorrhage, hydrocephalus, extra-axial collection or mass lesion/mass effect. Similar patchy areas of periventricular and subcortical white matter hypodensity, nonspecific but compatible with chronic microvascular ischemic disease. Vascular: No hyperdense vessel identified. Skull: No acute fracture Sinuses/Orbits: Clear sinuses.  No acute orbital findings. Other: No mastoid effusions. ASPECTS [facility] Stroke Program Early CT Score) total score (0-10 with 10 being normal): 10. IMPRESSION: 1. No evidence of acute intracranial abnormality.  ASPECTS is 10. 2. Similar chronic microvascular ischemic disease. Code stroke imaging results were communicated on 08/15/2022 at 1:18 pm to provider Dr. Roderic Palau via telephone, who  verbally acknowledged these results. Electronically Signed   By: Margaretha Sheffield M.D.   On: 08/15/2022 13:19    Pending Labs Unresulted Labs (From admission, onward)     Start     Ordered   08/17/22 0500  APTT  Daily,   R      08/15/22 1532   08/16/22 1911  Hemoglobin A1c  (Labs)  Once,   R       Comments: To assess prior glycemic control    08/15/22 1919   08/16/22 0500  Heparin level (unfractionated)  Daily,   R      08/15/22 1532   08/16/22 0500  Lipid panel  (Labs)  Tomorrow morning,   R       Comments: Fasting    08/15/22 1919   08/16/22 0500  Comprehensive metabolic panel  Tomorrow morning,   R        08/15/22 1919   08/16/22 0500  CBC  Tomorrow morning,   R        08/15/22 1919   08/16/22 0400  APTT  Once-Timed,   STAT        08/15/22 1532            Vitals/Pain Today's Vitals   08/15/22 1830 08/15/22 1907 08/15/22 1928 08/15/22 1930  BP: 110/89  107/73 129/84  Pulse: (!) 118 (!) 140  (!) 141  Resp: 19 (!) 34  18  Temp:      TempSrc:      SpO2: 97% 98%  98%  Weight:      Height:      PainSc:        Isolation Precautions No active isolations  Medications Medications  0.9 %  sodium chloride infusion ( Intravenous New Bag/Given 08/15/22 1549)  heparin ADULT infusion 100 units/mL (25000 units/278m) (900 Units/hr Intravenous New Bag/Given 08/15/22 1927)  ezetimibe (ZETIA) tablet 10 mg (has no administration in time range)  PARoxetine (PAXIL) tablet 20 mg (has no administration in time range)  pantoprazole (PROTONIX) EC tablet 40 mg (has no administration in time range)  clopidogrel (PLAVIX) tablet 75 mg (has no administration in time range)   stroke: early stages of recovery book (has no administration in time range)  acetaminophen (TYLENOL) tablet 650 mg (has no administration in time range)    Or  acetaminophen (TYLENOL) 160 MG/5ML solution 650 mg (has no administration in time range)    Or  acetaminophen (TYLENOL) suppository 650 mg (has no  administration in time range)  senna-docusate (  Senokot-S) tablet 1 tablet (has no administration in time range)  iohexol (OMNIPAQUE) 350 MG/ML injection 100 mL (100 mLs Intravenous Contrast Given 08/15/22 1415)  sodium chloride 0.9 % bolus 1,000 mL (0 mLs Intravenous Stopped 08/15/22 1722)  metoprolol tartrate (LOPRESSOR) injection 2.5 mg (2.5 mg Intravenous Given 08/15/22 1821)    Mobility walks     Focused Assessments Neuro Assessment Handoff:  Swallow screen pass? Unknown yet his orders are NPO, lay flat and bedrest  Cardiac Rhythm: Atrial fibrillation NIH Stroke Scale  Dizziness Present: No Headache Present: No Interval: Other (Comment) Level of Consciousness (1a.)   : Alert, keenly responsive LOC Questions (1b. )   : Answers both questions correctly LOC Commands (1c. )   : Performs both tasks correctly Best Gaze (2. )  : Partial gaze palsy Visual (3. )  : Partial hemianopia Facial Palsy (4. )    : Normal symmetrical movements Motor Arm, Left (5a. )   : No drift Motor Arm, Right (5b. ) : Drift Motor Leg, Left (6a. )  : No drift Motor Leg, Right (6b. ) : No drift Limb Ataxia (7. ): Present in one limb Sensory (8. )  : Normal, no sensory loss Best Language (9. )  : No aphasia Dysarthria (10. ): Normal Extinction/Inattention (11.)   : No Abnormality Complete NIHSS TOTAL: 4 Last date known well: 08/15/22 Last time known well: 1030 Neuro Assessment: Exceptions to WDL Neuro Checks:   Initial (08/15/22 1311)  Has TPA been given? No If patient is a Neuro Trauma and patient is going to OR before floor call report to [place] nurse: [phone] or [phone]   R Recommendations: See Admitting Provider Note  Report given to:   Additional Notes:  Needs to see vascular surgeon d/t blockage  Heparin going

## 2022-08-15 NOTE — H&P (Addendum)
History and Physical   Kenneth [name] KCL:[id number] DOB: [birth year] DOA: 08/15/2022  PCP: [name], MD   Patient coming from: Home  Chief Complaint: Focal neurologic deficits  HPI: Kenneth [name] is a 82 y.o. 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  Patient initially noticed numbness in his left fifth digit yesterday for a few minutes.  This recurred today and was followed by right hand weakness.  He states this lasted for several hours today. Neurology was consulted at [facility] where patient initially presented via teleneurology.  Due to patient's known severe/critical carotid artery disease he was recommended to be transferred to [facility] for further evaluation and treatment as well as vascular surgery evaluation.  Currently all hand symptoms have resolved.Marland Kitchen  He denies fevers, chills, chest pain, shortness of breath, abdominal pain, constipation, diarrhea, nausea, vomiting.  ED Course: Vital signs in the ED significant for heart rate in the 80s to 120s, did have episode of A-fib with RVR.  Blood pressure in the 494W to 967 systolic.  Saturating well on 6 L at chronic trach.  Lab workup showed CMP with sodium 134, creatinine near baseline at 1.6, glucose 125, calcium 8.7.  CBC with hemoglobin stable at 12.2.  PT, PTT, INR all within normal limits.  Ethanol level negative.  UDS pending.  Urinalysis with hemoglobin and trace leukocytes only.  CTh no acute abnormality.  CTA of the head and neck confirmed critical left ICA stenosis with only 1 mm of opacification remaining which was similar to previous.  127 mL of penumbra at the left cerebrum, right carotid stent with moderate to severe stenosis.  Stenosis versus occlusion of right vertebral artery, left and right P2 stenosis.   Patient received a liter of fluids and was started on 100 cc an hour in the ED.  Also started on heparin drip and received one-time dose of metoprolol for RVR.  Neurology has seen patient multiple times in the course of evaluation and have recommended stroke workup, heparin drip, continue with Plavix and Zetia but hold Eliquis.  Vascular surgery consulted and will follow the patient and plan for carotid intervention this admission.  Review of Systems: As per HPI otherwise all other systems reviewed and are negative.  Past Medical History:  Diagnosis Date   Acute on chronic respiratory failure with hypoxia ([place]) 09/06/2018   Acute ST elevation myocardial infarction (STEMI) of inferior wall ([place]) 03/01/2020   [facility]   Anxiety    Arthritis    Back (05/04/2018)   Carotid artery disease ([place])    a. 04/2018 s/p R carotid stenting.   Chronic lower back pain    "have it at night" (05/04/2018)   CKD (chronic kidney disease), stage III ([place])    Coronary artery disease    s/p CABG in 2003, patent grafts by cath in 2007, s/p inferior STEMI in 02/2020 with DES to RCA, s/p NSTEMI in 05/2020 with DES to proximal-RCA, s/p NSTEMI in 11/2020 with balloon angioplasty to mid-RCA and STEMI in 08/2021 with DESx3 to RCA   CVA (cerebral vascular accident) ([place]) 10/2017   "little numb on my left face since" (05/04/2018)   Depression    GERD (gastroesophageal reflux disease)    High triglycerides    History of kidney stones    Hyperlipidemia LDL goal <70  Hypertension    Hypertensive crisis 08/08/2022   Laryngeal carcinoma ([place]) 1998   Peripheral vascular disease ([place])    Pneumonia 11/11/2021   Sinus bradycardia    Stroke [facility])    "he's had several little strokes; many that he wasn't aware of" (05/04/2018)   Tobacco abuse    Tracheal stenosis     Past Surgical History:  Procedure Laterality Date   CAROTID STENT Right 06-13-12; 05/04/2018   CAROTID STENT INSERTION N/A  06/13/2012   Procedure: CAROTID STENT INSERTION;  Surgeon: Serafina Mitchell, MD;  Location: [facility] CATH [facility];  Service: Cardiovascular;  Laterality: N/A;   CATARACT EXTRACTION, BILATERAL Bilateral    COLONOSCOPY  11/10/2011   Dr. [name]: hemorrhoids, tubular adenoma   CORNEAL TRANSPLANT Bilateral    CORONARY ARTERY BYPASS GRAFT  ~ 2003   "CABG X3"   CORONARY BALLOON ANGIOPLASTY N/A 11/26/2020   Procedure: CORONARY BALLOON ANGIOPLASTY;  Surgeon: Troy Sine, MD;  Location: [facility] CV LAB;  Service: Cardiovascular;  Laterality: N/A;   CORONARY STENT INTERVENTION N/A 06/05/2020   Procedure: CORONARY STENT INTERVENTION;  Surgeon: Lorretta Harp, MD;  Location: [place] CV LAB;  Service: Cardiovascular;  Laterality: N/A;   EYE SURGERY     INSERTION OF RETROGRADE CAROTID STENT Right 05/04/2018   Procedure: INSERTION OF RIGHT  CAROTID STENT using an ABBOT- XACT carotid stent system;  Surgeon: Serafina Mitchell, MD;  Location: [facility];  Service: Vascular;  Laterality: Right;   LAPAROSCOPIC CHOLECYSTECTOMY  2007   LEFT HEART CATH AND CORS/GRAFTS ANGIOGRAPHY N/A 10/07/2016   Procedure: Left Heart Cath and Cors/Grafts Angiography;  Surgeon: Troy Sine, MD;  Location: [place] CV LAB;  Service: Cardiovascular;  Laterality: N/A;   LEFT HEART CATH AND CORS/GRAFTS ANGIOGRAPHY N/A 06/05/2020   Procedure: LEFT HEART CATH AND CORS/GRAFTS ANGIOGRAPHY;  Surgeon: Lorretta Harp, MD;  Location: [place] CV LAB;  Service: Cardiovascular;  Laterality: N/A;   LEFT HEART CATH AND CORS/GRAFTS ANGIOGRAPHY N/A 11/26/2020   Procedure: LEFT HEART CATH AND CORS/GRAFTS ANGIOGRAPHY;  Surgeon: Troy Sine, MD;  Location: [place] CV LAB;  Service: Cardiovascular;  Laterality: N/A;   PARTIAL LARYNGECTOMY  1998   TRACHEOSTOMY  03/13/2018   "@ [facility]"    Social History  reports that he quit smoking about 52 years ago. His smoking use included cigarettes. He started smoking about 69 years ago. He has a 15.00  pack-year smoking history. He has never been exposed to tobacco smoke. He quit smokeless tobacco use about 20 years ago.  His smokeless tobacco use included chew. He reports that he does not drink alcohol and does not use drugs.  Allergies  Allergen Reactions   Nifedical Xl [Nifedipine] Other (See Comments)    Unknown reaction   Ativan [Lorazepam] Other (See Comments)    Altered mental state   Penicillins Hives    Has patient had a PCN reaction causing immediate rash, facial/tongue/throat swelling, SOB or lightheadedness with hypotension: Yes Has patient had a PCN reaction causing severe rash involving mucus membranes or skin necrosis: No Has patient had a PCN reaction that required hospitalization No Has patient had a PCN reaction occurring within the last 10 years: No If all of the above answers are "NO", then may proceed with Cephalosporin use.  HAS TOLERATED: cephalexin   Sulfacetamide Other (See Comments)    Unknown reaction   Sulfonamide Derivatives Other (See Comments)    Unknown reaction   Crestor [Rosuvastatin] Itching, Swelling and Rash  Erythromycin Rash   Firvanq [Vancomycin] Itching   Statins Itching, Swelling and Rash    Rash on his trunk and arms, swelling in his lips    Family History  Problem Relation Age of Onset   Dementia Mother    Heart disease Father    Cancer Brother    Heart disease Brother    Hyperlipidemia Brother    Cancer Brother    Colon cancer Neg Hx   Reviewed on admission  Prior to Admission medications   Medication Sig Start Date End Date Taking? Authorizing Provider  acetaminophen (TYLENOL) 500 MG tablet Take 500 mg by mouth every 6 (six) hours as needed for headache, fever, moderate pain or mild pain.   Yes [provider]  amLODipine (NORVASC) 10 MG tablet Take 1 tablet (10 mg total) by mouth daily. 08/09/22 10/08/22 Yes Shah, Pratik D, DO  apixaban (ELIQUIS) 2.5 MG TABS tablet Take 1 tablet (2.5 mg total) by mouth 2 (two) times  daily. 08/10/22 10/09/22 Yes Shah, Pratik D, DO  carvedilol (COREG) 3.125 MG tablet Take 3 tablets (9.375 mg total) by mouth 2 (two) times daily with a meal. 08/10/22 09/09/22 Yes Shah, Pratik D, DO  clopidogrel (PLAVIX) 75 MG tablet Take 1 tablet (75 mg total) by mouth daily. 08/11/22 10/10/22 Yes Shah, Pratik D, DO  ezetimibe (ZETIA) 10 MG tablet Take 1 tablet by mouth once daily Patient taking differently: Take 10 mg by mouth daily. 03/30/22  Yes BranchAlphonse Guild, MD  isosorbide mononitrate (IMDUR) 30 MG 24 hr tablet Take 1 tablet (30 mg total) by mouth daily. 08/11/22 10/10/22 Yes Shah, Pratik D, DO  losartan (COZAAR) 25 MG tablet Take 1 tablet (25 mg total) by mouth daily. 08/11/22 10/10/22 Yes Shah, Pratik D, DO  nitroGLYCERIN (NITROSTAT) 0.4 MG SL tablet Place 1 tablet (0.4 mg total) under the tongue every 5 (five) minutes as needed for chest pain. 01/31/17 05/28/23 Yes Lendon Colonel, NP  pantoprazole (PROTONIX) 40 MG tablet Take 1 tablet by mouth once daily 07/28/21  Yes Branch, Alphonse Guild, MD  PARoxetine (PAXIL) 20 MG tablet Take 20 mg by mouth daily.   Yes [provider]  PRALUENT 75 MG/ML SOAJ INJECT 1 [place] TWO WEEKS Patient taking differently: Inject 75 mg into the skin every 14 (fourteen) days. 03/25/22  Yes BranchAlphonse Guild, MD  spironolactone (ALDACTONE) 25 MG tablet Take 1 tablet (25 mg total) by mouth daily. 08/09/22 10/08/22 Yes Heath Lark D, DO    Physical Exam: Vitals:   08/15/22 1830 08/15/22 1907 08/15/22 1928 08/15/22 1930  BP: 110/89  107/73 129/84  Pulse: (!) 118 (!) 140  (!) 141  Resp: 19 (!) 34  18  Temp:      TempSrc:      SpO2: 97% 98%  98%  Weight:      Height:        Physical Exam Constitutional:      General: He is not in acute distress.    Appearance: Normal appearance.     Comments: Chronic trach in place  HENT:     Head: Normocephalic and atraumatic.     Mouth/Throat:     Mouth: Mucous membranes are moist.     Pharynx:  Oropharynx is clear.  Eyes:     Extraocular Movements: Extraocular movements intact.     Pupils: Pupils are equal, round, and reactive to light.  Cardiovascular:     Rate and Rhythm: Normal rate and regular rhythm.  Pulses: Normal pulses.     Heart sounds: Normal heart sounds.  Pulmonary:     Effort: Pulmonary effort is normal. No respiratory distress.     Breath sounds: Normal breath sounds.  Abdominal:     General: Bowel sounds are normal. There is no distension.     Palpations: Abdomen is soft.     Tenderness: There is no abdominal tenderness.  Musculoskeletal:        General: No swelling or deformity.  Skin:    General: Skin is warm and dry.  Neurological:     Comments: Mental Status: Patient is awake, alert, oriented No signs of aphasia or neglect Cranial Nerves: II: Pupils equal, round, and reactive to light.   III,IV, VI: EOMI without ptosis or diploplia.  V: Facial sensation is symmetric to light touch. VII: Facial movement is symmetric.  VIII: hearing is intact to voice X: Uvula elevates symmetrically XI: Shoulder shrug is symmetric. XII: tongue is midline without atrophy or fasciculations.  Motor: Good effort thorughout, at Least 5/5 bilateral UE, 5/5 bilateral lower extremitiy  Sensory: Sensation is grossly intact bilateral UEs & LEs Cerebellar: Finger-Nose intact bilalat    Labs on Admission: I have personally reviewed following labs and imaging studies  CBC: Recent Labs  Lab 08/11/22 0421 08/15/22 1304 08/15/22 1312  WBC 9.6 9.1  --   NEUTROABS 6.5 6.3  --   HGB 12.2* 12.2* 13.3  HCT 38.0* 38.6* 39.0  MCV 90.3 91.7  --   PLT 232 323  --     Basic Metabolic Panel: Recent Labs  Lab 08/09/22 0913 08/15/22 1304 08/15/22 1312  NA 136 134* 137  K 3.7 4.3 4.5  CL 103 101 102  CO2 20* 24  --   GLUCOSE 103* 125* 127*  BUN 24* 22 22  CREATININE 1.59* 1.60* 1.60*  CALCIUM 8.2* 8.7*  --   MG 1.9  --   --     GFR: Estimated Creatinine  Clearance: 34.2 mL/min (A) (by C-G formula based on SCr of 1.6 mg/dL (H)).  Liver Function Tests: Recent Labs  Lab 08/15/22 1304  AST 23  ALT 22  ALKPHOS 103  BILITOT 0.4  PROT 7.2  ALBUMIN 3.6    Urine analysis:    Component Value Date/Time   COLORURINE STRAW (A) 08/15/2022 1658   APPEARANCEUR CLEAR 08/15/2022 1658   LABSPEC 1.020 08/15/2022 1658   PHURINE 6.0 08/15/2022 1658   GLUCOSEU NEGATIVE 08/15/2022 1658   GLUCOSEU neg 06/01/2010 1113   HGBUR SMALL (A) 08/15/2022 1658   BILIRUBINUR NEGATIVE 08/15/2022 1658   KETONESUR NEGATIVE 08/15/2022 1658   PROTEINUR NEGATIVE 08/15/2022 1658   NITRITE NEGATIVE 08/15/2022 1658   LEUKOCYTESUR TRACE (A) 08/15/2022 1658    Radiological Exams on Admission: CT ANGIO HEAD NECK W WO CM W PERF (CODE STROKE)  Result Date: 08/15/2022 CLINICAL DATA:  Neuro deficit, acute, stroke suspected EXAM: CT ANGIOGRAPHY HEAD AND NECK CT PERFUSION BRAIN TECHNIQUE: Multidetector CT imaging of the head and neck was performed using the standard protocol during bolus administration of intravenous contrast. Multiplanar CT image reconstructions and MIPs were obtained to evaluate the vascular anatomy. Carotid stenosis measurements (when applicable) are obtained utilizing NASCET criteria, using the distal internal carotid diameter as the denominator. Multiphase CT imaging of the brain was performed following IV bolus contrast injection. Subsequent parametric perfusion maps were calculated using RAPID software. RADIATION DOSE REDUCTION: This exam was performed according to the departmental dose-optimization program which includes automated exposure control, adjustment  of the mA and/or kV according to patient size and/or use of iterative reconstruction technique. CONTRAST:  118m OMNIPAQUE IOHEXOL 350 MG/ML SOLN COMPARISON:  None Available. Same day CT head.  CTA head/neck January 20, 2021. FINDINGS: CTA NECK FINDINGS Aortic arch: Great vessel origins are patent. Right  carotid system: Stenting of the common carotid artery and the proximal internal carotid artery. The stent is patent. There is moderate (approximately 60-70% stenosis in the common carotid stent). Left carotid system: Common carotid artery is patent. Critical stenosis of the left proximal ICA in the neck with trace (approximately 1 mm diameter) luminal opacification. Findings are mildly progressed from the prior. There is opacification of the more distal ICA in the neck. Vertebral arteries: Patent bilaterally. No evidence of hemodynamically significant stenosis in the neck. Skeleton: Multilevel degenerative change. No acute findings on limited assessment. Other neck: No acute findings on limited assessment. A dedicated CT neck could better assess for malignancy. Upper chest: Visualized lung apices are largely clear with evidence of expiratory changes. Review of the MIP images confirms the above findings CTA HEAD FINDINGS Anterior circulation: Bilateral intracranial ICAs are patent. Moderate stenosis of the right cavernous and paraclinoid ICA. Bilateral MCAs and ACAs are patent. Posterior circulation: Chronically severely stenotic versus occluded right intradural vertebral artery. Left intradural vertebral artery is patent with moderate stenosis. Basilar artery and bilateral posterior cerebral arteries are patent. Mild right and moderate left P2 PCA stenosis. Venous sinuses: As permitted by contrast timing, patent. Review of the MIP images confirms the above findings CT Brain Perfusion Findings: ASPECTS: 10 CBF (<30%) Volume: 025mPerfusion (Tmax>6.0s) volume: 12789mismatch Volume: 127m49mfarction Location:None identified. IMPRESSION: 1. Critical stenosis of the left proximal ICA in the neck with trace (approximately 1 mm diameter) luminal opacification. Findings are mildly progressed from the prior. 2. On CT perfusion, approximately 127 mL of the reported penumbra in the left cerebrum which likely relates to the  above. No core infarct identified. 3. Right common carotid artery and internal carotid artery stent. Moderate to severe stenosis of the common carotid artery stent with approximately 60-70% stenosis. A catheter arteriogram could more accurately quantify/characterize if clinically warranted. 4. 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 5. Moderate right intracranial ICA stenosis. 6. Moderate left and mild right P2 PCA stenosis. Findings discussed with Dr. Xu vErlinda Hong telephone at 2:35 p.m. Electronically Signed   By: FredMargaretha Sheffield.   On: 08/15/2022 14:40   CT HEAD CODE STROKE WO CONTRAST  Result Date: 08/15/2022 CLINICAL DATA:  Code stroke.  Neuro deficit, acute, stroke suspected EXAM: CT HEAD WITHOUT CONTRAST TECHNIQUE: Contiguous axial images were obtained from the base of the skull through the vertex without intravenous contrast. RADIATION DOSE REDUCTION: This exam was performed according to the departmental dose-optimization program which includes automated exposure control, adjustment of the mA and/or kV according to patient size and/or use of iterative reconstruction technique. COMPARISON:  January 20, 2021. FINDINGS: Brain: No evidence of acute large vascular territory infarction, hemorrhage, hydrocephalus, extra-axial collection or mass lesion/mass effect. Similar patchy areas of periventricular and subcortical white matter hypodensity, nonspecific but compatible with chronic microvascular ischemic disease. Vascular: No hyperdense vessel identified. Skull: No acute fracture Sinuses/Orbits: Clear sinuses.  No acute orbital findings. Other: No mastoid effusions. ASPECTS ([facility] Early CT Score) total score (0-10 with 10 being normal): 10. IMPRESSION: 1. No evidence of acute intracranial abnormality.  ASPECTS is 10. 2. Similar chronic microvascular ischemic disease. Code stroke imaging results were  communicated on 08/15/2022 at 1:18 pm to provider Dr. Roderic Palau  via telephone, who verbally acknowledged these results. Electronically Signed   By: Margaretha Sheffield M.D.   On: 08/15/2022 13:19    EKG: Independently reviewed.  Atrial fibrillation at 108 bpm.  Q waves in 3 and aVF.Marland Kitchen  Right bundle branch block.  Similar to previous.  Assessment/Plan Principal Problem:   TIA (transient ischemic attack) Active Problems:   Malignant neoplasm of larynx (HCC)   Mixed hyperlipidemia   CAD S/P percutaneous coronary angioplasty   Carotid arterial disease (HCC)   Essential hypertension   History of CVA (cerebrovascular accident)   Tracheostomy dependent (HCC)   Hx of CABG   Chronic kidney disease, stage 3a (HCC)   GERD (gastroesophageal reflux disease)   Depression   Acute TIA Critical carotid artery disease > Patient presenting with left hand numbness and right hand weakness in the last couple days. > This is in the setting of previous TIA/CVA and known critical carotid artery disease. > CT head without acute normality but CTA head and neck showing left internal carotid artery with about a millimeter of opacification/lumen left, right carotid artery stented with moderate to severe disease, stenosed versus occluded right vertebral artery and stenosed P2 arteries.  Also was noted to have 127 mL penumbra of the cerebrum. > Neurology consulted and have evaluated the patient.  Recommending stroke workup, heparin, Plavix, Zetia, blood pressure goal between 509 and 326 systolic.  Given carotid artery disease and patient on anticoagulation. > Vascular surgery consulted and patient will need surgical intervention this admission.  This reportedly has some more complex anatomy due to history of radiation for history of laryngeal cancer. - Monitor in progressive unit - Appreciate neurology and vascular surgery recommendations - Goal blood pressure greater than 712 systolic, permissive to 458 systolic (reduced due to anticoagulation) - Continue home Plavix and Zetia (is  on Praluent outpatient) - Has already had recent echo cardiogram - A1C  - Lipid panel  - Tele monitoring  - SLP eval - PT/OT  Carotid artery disease > Severe disease as above > Vascular surgery consulted as above > Plan for surgery this admission but unclear when - Appreciate vascular surgery recommendations - Blood pressure goal greater than 099 systolic - Continue with IV fluids - Hold home antihypertensives for permissive hypertension - Consider pressors/midodrine and ICU/Neurology consult if new/worsening deficits - Continue home Plavix and Zetia (is on Praluent outpatient) - On heparin  Atrial fibrillation > Patient with known history atrial fibrillation diagnosed recently. > Has been on Eliquis outpatient, this is held and started on heparin given concern for stroke and need for intervention on carotid > Did have episode of RVR in the ED to the 120s to 130s with some response to IV metoprolol, will want to avoid further metoprolol given goal blood pressure. - Holding home beta-blocker, Eliquis - Continue with heparin - Consider amiodarone if further RVR (will monitor on progressive for now)  Hyperlipidemia CAD > Status post CABG - Continue home Zetia (is on Praluent outpatient) - Holding home carvedilol, Imdur for permissive hypertension as above  CKD 3 > Creatinine near baseline at 1.6 in the ED. - Continue to trend renal function and electrolytes - Continue with IV fluids  GERD - Continue home PPI  Depression - Continue home Paxil  History of laryngeal cancer Chronic trach > History of laryngeal cancer with radiation therapy and subsequent radiation treatment that is of trachea leading to chronic trach.  As above  this may also complicate his carotid artery disease anatomy. - Noted  DVT prophylaxis: Heparin Code Status:   Full Family Communication:  Updated at bedside  Disposition Plan:   Patient is from:  Home  Anticipated DC to:  Pending clinical  course  Anticipated DC date:  2 to 5 days  Anticipated DC barriers: None  Consults called:  Neurology, vascular surgery Admission status:  Patient, progressive  Severity of Illness: The appropriate patient status for this patient is INPATIENT. Inpatient status is judged to be reasonable and necessary in order to provide the required intensity of service to ensure the patient's safety. The patient's presenting symptoms, physical exam findings, and initial radiographic and laboratory data in the context of their chronic comorbidities is felt to place them at high risk for further clinical deterioration. Furthermore, it is not anticipated that the patient will be medically stable for discharge from the hospital within 2 midnights of admission.   * I certify that at the point of admission it is my clinical judgment that the patient will require inpatient hospital care spanning beyond 2 midnights from the point of admission due to high intensity of service, high risk for further deterioration and high frequency of surveillance required.Marcelyn Bruins MD [facility]  How to contact the [facility] Attending or Consulting provider [place] or covering provider during after hours [place], for this patient?   Check the care team in [facility] and look for a) attending/consulting [facility] provider listed and b) the [facility] team listed Log into [email or web address] and use [place]'s universal password to access. If you do not have the password, please contact the hospital operator. Locate the [facility] provider you are looking for under [facility] and page to a number that you can be directly reached. If you still have difficulty reaching the provider, please page the [facility] (Director on Call) for the Hospitalists listed on amion for assistance.  08/15/2022, 8:28 PM

## 2022-08-15 NOTE — ED Provider Notes (Signed)
Patient transferred from [facility] for further evaluation by neurology and vascular surgery.  He has a history of A-fib on Eliquis, chronic trach.  He has known carotid disease.  He had new onset of weakness in his right hand and possibly some visual symptoms.  Was evaluated [facility], CT not showing any acute stroke but does have critical carotid disease.  He was transferred down here to see by [facility].  They said he has been increasingly tachycardic and dyspneic needing to go the bathroom getting agitated. Physical Exam  BP 108/82   Pulse 82   Temp 97.8 F (36.6 C)   Resp 18   Ht '5\' 5"'$  (1.651 m)   Wt 80.6 kg   SpO2 95%   BMI 29.57 kg/m   Physical Exam Patient is awake and alert.  He is tachycardic in the 140s irregularly irregular.  Trach.  Tachypneic although satting well.  Restless. Procedures  Procedures  ED Course / MDM    Medical Decision Making Amount and/or Complexity of Data Reviewed Labs: ordered. Radiology: ordered.  Risk Prescription drug management. Decision regarding hospitalization.   Dr. Erlinda Hong down here from neurology.  He is can evaluate the patient and make sure he does not need any acute interventions.  I updated Dr. Trula Slade from vascular surgery.  He will evaluate patient but he anticipates he will need a carotid surgery during this admission.  Will need medicine admission.  1740.  Dr. Erlinda Hong feels patient appropriate for hospitalist admission.  Unassigned paged for admission  Discussed with Dr. Trilby Drummer [facility] hospitalist who will evaluate patient for admission.       Hayden Rasmussen, MD 08/16/22 1031

## 2022-08-15 NOTE — Progress Notes (Signed)
Code Stroke time documentation  2229 Call time 1303 Beeper time 1306 Exam started 1309 Exam finished 1314 Exam completed in [facility] called

## 2022-08-15 NOTE — Progress Notes (Signed)
CTA head and neck and CTP resulted:   IMPRESSION: 1. Critical stenosis of the left proximal ICA in the neck with trace (approximately 1 mm diameter) luminal opacification. Findings are mildly progressed from the prior. 2. On CT perfusion, approximately 127 mL of the reported penumbra in the left cerebrum which likely relates to the above. No core infarct identified. 3. Right common carotid artery and internal carotid artery stent. Moderate to severe stenosis of the common carotid artery stent with approximately 60-70% stenosis. A catheter arteriogram could more accurately quantify/characterize if clinically warranted. 4. 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 5. Moderate right intracranial ICA stenosis. 6. Moderate left and mild right P2 PCA stenosis.   Pt chronic left ICA stenosis progressed from before and now symptomatic with near occlusion. However, his NIHSS = 4 with mainly right hand weakness and numbness. I called Dr. Estanislado Pandy and Dr. Trula Slade, concensus is to transfer pt to [facility] ASAP and put on heparin IV, continue plavix. Dr. Trula Slade will see pt her in [facility] and consider left ICA revascularization after. At the meantime, will keep pt bed rest, head of bed flat, and IVF to avoid hypotension with SBP goal ideally 140-160. I also discussed with Dr. Roderic Palau EDP to facilitate the transfer. I also talked with pt and his wife over the video and answered all their questions. Pt right hand weakness is improving in [facility]. I will see him again in [facility] after transfer to make sure his neuro status is stable. If acute neuro decline, may consider stat thrombectomy or ICA stenting.   Rosalin Hawking, MD PhD Stroke Neurology 08/15/2022 3:36 PM  This patient is critically ill due to critical left ICA stenosis, afib on eliquis, CAD NSTEMI on plavix and at significant risk of neurological worsening, death form large stroke, cerebral edema, brain herniation, bleeding from  heparin IV. This patient's care requires constant monitoring of vital signs, hemodynamics, respiratory and cardiac monitoring, review of multiple databases, neurological assessment, discussion with family, other specialists and medical decision making of high complexity. I spent 60 minutes of neurocritical care time in the care of this patient.

## 2022-08-15 NOTE — ED Notes (Signed)
Called 3W, I was told 20 more minutes

## 2022-08-15 NOTE — ED Provider Notes (Signed)
[facility] Provider Note   CSN: [id number] Arrival date & time: 08/15/22  1239  An emergency department physician performed an initial assessment on this suspected stroke patient at 1257.  History  Chief Complaint  Patient presents with   Neurologic Problem    Kenneth [name] is a 82 y.o. male.  Patient has a history of stroke and an MI.  He had some minimal numbness in his left small finger yesterday that lasted a few minutes and today around 10:30 in the morning he started having weakness in his right hand  The history is provided by the patient and the spouse. No language interpreter was used.  Neurologic Problem This is a new problem. The problem occurs constantly. The problem has not changed since onset.Pertinent negatives include no chest pain, no abdominal pain and no headaches. Nothing aggravates the symptoms. Nothing relieves the symptoms. He has tried nothing for the symptoms. The treatment provided no relief.       Home Medications Prior to Admission medications   Medication Sig Start Date End Date Taking? Authorizing Provider  acetaminophen (TYLENOL) 500 MG tablet Take 500 mg by mouth every 6 (six) hours as needed.    [provider]  amLODipine (NORVASC) 10 MG tablet Take 1 tablet (10 mg total) by mouth daily. 08/09/22 10/08/22  Manuella Ghazi, Pratik D, DO  apixaban (ELIQUIS) 2.5 MG TABS tablet Take 1 tablet (2.5 mg total) by mouth 2 (two) times daily. 08/10/22 10/09/22  Manuella Ghazi, Pratik D, DO  carvedilol (COREG) 3.125 MG tablet Take 3 tablets (9.375 mg total) by mouth 2 (two) times daily with a meal. 08/10/22 09/09/22  Manuella Ghazi, Pratik D, DO  clopidogrel (PLAVIX) 75 MG tablet Take 1 tablet (75 mg total) by mouth daily. 08/11/22 10/10/22  Manuella Ghazi, Pratik D, DO  ezetimibe (ZETIA) 10 MG tablet Take 1 tablet by mouth once daily 03/30/22   Arnoldo Lenis, MD  isosorbide mononitrate (IMDUR) 30 MG 24 hr tablet Take 1 tablet (30 mg total) by  mouth daily. 08/11/22 10/10/22  Manuella Ghazi, Pratik D, DO  losartan (COZAAR) 25 MG tablet Take 1 tablet (25 mg total) by mouth daily. 08/11/22 10/10/22  Manuella Ghazi, Pratik D, DO  nitroGLYCERIN (NITROSTAT) 0.4 MG SL tablet Place 1 tablet (0.4 mg total) under the tongue every 5 (five) minutes as needed for chest pain. 01/31/17 05/28/23  Lendon Colonel, NP  pantoprazole (PROTONIX) 40 MG tablet Take 1 tablet by mouth once daily 07/28/21   Arnoldo Lenis, MD  PARoxetine (PAXIL) 20 MG tablet Take 20 mg by mouth daily.    [provider]  PRALUENT 75 MG/ML SOAJ INJECT [place] TWO WEEKS Patient taking differently: Inject 75 mg into the skin every 14 (fourteen) days. 03/25/22   Arnoldo Lenis, MD  spironolactone (ALDACTONE) 25 MG tablet Take 1 tablet (25 mg total) by mouth daily. 08/09/22 10/08/22  Manuella Ghazi, Pratik D, DO      Allergies    Nifedipine, Lorazepam, Other, Penicillins, Rosuvastatin, Sulfacetamide, Sulfonamide derivatives, Erythromycin, Statins, and Vancomycin    Review of Systems   Review of Systems  Constitutional:  Negative for appetite change and fatigue.  HENT:  Negative for congestion, ear discharge and sinus pressure.   Eyes:  Negative for discharge.  Respiratory:  Negative for cough.   Cardiovascular:  Negative for chest pain.  Gastrointestinal:  Negative for abdominal pain and diarrhea.  Genitourinary:  Negative for frequency and hematuria.  Musculoskeletal:  Negative for  back pain.  Skin:  Negative for rash.  Neurological:  Negative for seizures and headaches.       Weakness in his right hand  Psychiatric/Behavioral:  Negative for hallucinations.     Physical Exam Updated Vital Signs BP 109/70   Pulse 88   Resp 19   SpO2 94%  Physical Exam Vitals and nursing note reviewed.  Constitutional:      Appearance: He is well-developed.  HENT:     Head: Normocephalic.     Comments: Patient has a trach    Nose: Nose normal.  Eyes:     General: No scleral  icterus.    Conjunctiva/sclera: Conjunctivae normal.  Neck:     Thyroid: No thyromegaly.  Cardiovascular:     Rate and Rhythm: Normal rate and regular rhythm.     Heart sounds: No murmur heard.    No friction rub. No gallop.  Pulmonary:     Breath sounds: No stridor. No wheezing or rales.  Chest:     Chest wall: No tenderness.  Abdominal:     General: There is no distension.     Tenderness: There is no abdominal tenderness. There is no rebound.  Musculoskeletal:        General: Normal range of motion.     Cervical back: Neck supple.     Comments: Minimal weakness in right hand with minimal decreased dexterity  Lymphadenopathy:     Cervical: No cervical adenopathy.  Skin:    Findings: No erythema or rash.  Neurological:     Mental Status: He is alert and oriented to person, place, and time.     Motor: No abnormal muscle tone.     Coordination: Coordination normal.  Psychiatric:        Behavior: Behavior normal.     ED Results / Procedures / Treatments   Labs (all labs ordered are listed, but only abnormal results are displayed) Labs Reviewed  CBC - Abnormal; Notable for the following components:      Result Value   RBC 4.21 (*)    Hemoglobin 12.2 (*)    HCT 38.6 (*)    RDW 15.8 (*)    All other components within normal limits  COMPREHENSIVE METABOLIC PANEL - Abnormal; Notable for the following components:   Sodium 134 (*)    Glucose, Bld 125 (*)    Creatinine, Ser 1.60 (*)    Calcium 8.7 (*)    GFR, Estimated 42 (*)    All other components within normal limits  CBG MONITORING, ED - Abnormal; Notable for the following components:   Glucose-Capillary 139 (*)    All other components within normal limits  I-STAT CHEM 8, ED - Abnormal; Notable for the following components:   Creatinine, Ser 1.60 (*)    Glucose, Bld 127 (*)    All other components within normal limits  ETHANOL  PROTIME-INR  APTT  DIFFERENTIAL  RAPID URINE DRUG SCREEN, HOSP PERFORMED  URINALYSIS,  ROUTINE W REFLEX MICROSCOPIC    EKG None  Radiology CT ANGIO HEAD NECK W WO CM W PERF (CODE STROKE)  Result Date: 08/15/2022 CLINICAL DATA:  Neuro deficit, acute, stroke suspected EXAM: CT ANGIOGRAPHY HEAD AND NECK CT PERFUSION BRAIN TECHNIQUE: Multidetector CT imaging of the head and neck was performed using the standard protocol during bolus administration of intravenous contrast. Multiplanar CT image reconstructions and MIPs were obtained to evaluate the vascular anatomy. Carotid stenosis measurements (when applicable) are obtained utilizing NASCET criteria, using the distal internal  carotid diameter as the denominator. Multiphase CT imaging of the brain was performed following IV bolus contrast injection. Subsequent parametric perfusion maps were calculated using RAPID software. RADIATION DOSE REDUCTION: This exam was performed according to the departmental dose-optimization program which includes automated exposure control, adjustment of the mA and/or kV according to patient size and/or use of iterative reconstruction technique. CONTRAST:  119m OMNIPAQUE IOHEXOL 350 MG/ML SOLN COMPARISON:  None Available. Same day CT head.  CTA head/neck January 20, 2021. FINDINGS: CTA NECK FINDINGS Aortic arch: Great vessel origins are patent. Right carotid system: Stenting of the common carotid artery and the proximal internal carotid artery. The stent is patent. There is moderate (approximately 60-70% stenosis in the common carotid stent). Left carotid system: Common carotid artery is patent. Critical stenosis of the left proximal ICA in the neck with trace (approximately 1 mm diameter) luminal opacification. Findings are mildly progressed from the prior. There is opacification of the more distal ICA in the neck. Vertebral arteries: Patent bilaterally. No evidence of hemodynamically significant stenosis in the neck. Skeleton: Multilevel degenerative change. No acute findings on limited assessment. Other neck: No acute  findings on limited assessment. A dedicated CT neck could better assess for malignancy. Upper chest: Visualized lung apices are largely clear with evidence of expiratory changes. Review of the MIP images confirms the above findings CTA HEAD FINDINGS Anterior circulation: Bilateral intracranial ICAs are patent. Moderate stenosis of the right cavernous and paraclinoid ICA. Bilateral MCAs and ACAs are patent. Posterior circulation: Chronically severely stenotic versus occluded right intradural vertebral artery. Left intradural vertebral artery is patent with moderate stenosis. Basilar artery and bilateral posterior cerebral arteries are patent. Mild right and moderate left P2 PCA stenosis. Venous sinuses: As permitted by contrast timing, patent. Review of the MIP images confirms the above findings CT Brain Perfusion Findings: ASPECTS: 10 CBF (<30%) Volume: 056mPerfusion (Tmax>6.0s) volume: 12769mismatch Volume: 127m28mfarction Location:None identified. IMPRESSION: 1. Critical stenosis of the left proximal ICA in the neck with trace (approximately 1 mm diameter) luminal opacification. Findings are mildly progressed from the prior. 2. On CT perfusion, approximately 127 mL of the reported penumbra in the left cerebrum which likely relates to the above. No core infarct identified. 3. Right common carotid artery and internal carotid artery stent. Moderate to severe stenosis of the common carotid artery stent with approximately 60-70% stenosis. A catheter arteriogram could more accurately quantify/characterize if clinically warranted. 4. 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 5. Moderate right intracranial ICA stenosis. 6. Moderate left and mild right P2 PCA stenosis. Findings discussed with Dr. Xu vErlinda Hong telephone at 2:35 p.m. Electronically Signed   By: FredMargaretha Sheffield.   On: 08/15/2022 14:40   CT HEAD CODE STROKE WO CONTRAST  Result Date: 08/15/2022 CLINICAL DATA:  Code  stroke.  Neuro deficit, acute, stroke suspected EXAM: CT HEAD WITHOUT CONTRAST TECHNIQUE: Contiguous axial images were obtained from the base of the skull through the vertex without intravenous contrast. RADIATION DOSE REDUCTION: This exam was performed according to the departmental dose-optimization program which includes automated exposure control, adjustment of the mA and/or kV according to patient size and/or use of iterative reconstruction technique. COMPARISON:  January 20, 2021. FINDINGS: Brain: No evidence of acute large vascular territory infarction, hemorrhage, hydrocephalus, extra-axial collection or mass lesion/mass effect. Similar patchy areas of periventricular and subcortical white matter hypodensity, nonspecific but compatible with chronic microvascular ischemic disease. Vascular: No hyperdense vessel identified. Skull: No acute fracture Sinuses/Orbits: Clear sinuses.  No acute orbital findings. Other: No mastoid effusions. ASPECTS [facility] Stroke Program Early CT Score) total score (0-10 with 10 being normal): 10. IMPRESSION: 1. No evidence of acute intracranial abnormality.  ASPECTS is 10. 2. Similar chronic microvascular ischemic disease. Code stroke imaging results were communicated on 08/15/2022 at 1:18 pm to provider Dr. Roderic Palau via telephone, who verbally acknowledged these results. Electronically Signed   By: Margaretha Sheffield M.D.   On: 08/15/2022 13:19    Procedures Procedures    Medications Ordered in ED Medications  sodium chloride 0.9 % bolus 1,000 mL (has no administration in time range)  0.9 %  sodium chloride infusion (has no administration in time range)  iohexol (OMNIPAQUE) 350 MG/ML injection 100 mL (100 mLs Intravenous Contrast Given 08/15/22 1415)    ED Course/ Medical Decision Making/ A&P  CRITICAL CARE Performed by: Milton Ferguson Total critical care time: 65 minutes Critical care time was exclusive of separately billable procedures and treating other  patients. Critical care was necessary to treat or prevent imminent or life-threatening deterioration. Critical care was time spent personally by me on the following activities: development of treatment plan with patient and/or surrogate as well as nursing, discussions with consultants, evaluation of patient's response to treatment, examination of patient, obtaining history from patient or surrogate, ordering and performing treatments and interventions, ordering and review of laboratory studies, ordering and review of radiographic studies, pulse oximetry and re-evaluation of patient's condition.                            Medical Decision Making Amount and/or Complexity of Data Reviewed Labs: ordered. Radiology: ordered.  Risk Prescription drug management. Decision regarding hospitalization.  This patient presents to the ED for concern weakness in right hand pain, this involves an extensive number of treatment options, and is a complaint that carries with it a high risk of complications and morbidity.  The differential diagnosis includes stroke, claudication   Co morbidities that complicate the patient evaluation  History of stroke   Additional history obtained:  Additional history obtained from patient External records from outside source obtained and reviewed including hospital records   Lab Tests:  I Ordered, and personally interpreted labs.  The pertinent results include: Hemoglobin 12, chemistries unremarkable   Imaging Studies ordered:  I ordered imaging studies including CT angio of the legs I independently visualized and interpreted imaging which showed edema I agree with the radiologist interpretation   Cardiac Monitoring: / EKG:  The patient was maintained on a cardiac monitor.  I personally viewed and interpreted the cardiac monitored which showed an underlying rhythm of: Normal sinus rhythm   Consultations Obtained:  I requested consultation with the  neurology and ,  and discussed lab and imaging findings as well as pertinent plan - they recommend: Answered to [facility] for admission   Problem List / ED Course / Critical interventions / Medication management  Stroke to left hand I ordered medication including normal saline Reevaluation of the patient after these medicines showed that the patient stayed the same I have reviewed the patients home medicines and have made adjustments as needed   Social Determinants of Health:  None   Test / Admission - Considered:  None  Patient having weakness in his right hand from a stroke.  His left ICA has worsening stenosis.  Patient has been seen by the neurologist Dr.Xu and he wants the patient transferred down to [facility]  emergency department.  He will see the patient there and he will have the vascular surgeon Dr. Nechama Guard to see the patient also.        Final Clinical Impression(s) / ED Diagnoses Final diagnoses:  Weakness    Rx / DC Orders ED Discharge Orders     None         Milton Ferguson, MD 08/17/22 1204

## 2022-08-15 NOTE — ED Notes (Signed)
Pt arrives via [facility] from [facility]. Pt very insistent that he needs to urinate and has to stand. EDP ordered in and out cath, 700 cc out. Pt now resting. RT at bedside due to O2 sats 85% on RA. HR elevated in 130-160s. Neuro at bedside.

## 2022-08-15 NOTE — Progress Notes (Signed)
ANTICOAGULATION CONSULT NOTE - Initial Consult  Pharmacy Consult for heparin Indication: atrial fibrillation  Allergies  Allergen Reactions   Nifedipine Other (See Comments)   Lorazepam Other (See Comments)    REACTION: Alters mental status. "Turns into maniac"   Other     REACTION: Unknown to patient. States that MD states allergy per medical records   Penicillins Hives    Has patient had a PCN reaction causing immediate rash, facial/tongue/throat swelling, SOB or lightheadedness with hypotension: Yes Has patient had a PCN reaction causing severe rash involving mucus membranes or skin necrosis: No Has patient had a PCN reaction that required hospitalization No Has patient had a PCN reaction occurring within the last 10 years: No If all of the above answers are "NO", then may proceed with Cephalosporin use.  HAS TOLERATED: cephalexin   Rosuvastatin Other (See Comments)   Sulfacetamide     REACTION: Unknown to patient. States that MD states allergy per medical records   Sulfonamide Derivatives Other (See Comments)    REACTION: Unknown to patient. States that MD states allergy per medical records   Erythromycin Rash   Statins Itching and Rash    Rash on his trunk and arms, swelling in his lips   Vancomycin Itching    Patient Measurements: Height: '5\' 5"'$  (165.1 cm) Weight: 80.6 kg (177 lb 11.1 oz) IBW/kg (Calculated) : 61.5 Heparin Dosing Weight: 78kg  Vital Signs: BP: 109/70 (01/21 1500) Pulse Rate: 88 (01/21 1500)  Labs: Recent Labs    08/15/22 1304 08/15/22 1312  HGB 12.2* 13.3  HCT 38.6* 39.0  PLT 323  --   APTT 28  --   LABPROT 14.8  --   INR 1.2  --   CREATININE 1.60* 1.60*    Estimated Creatinine Clearance: 34.2 mL/min (A) (by C-G formula based on SCr of 1.6 mg/dL (H)).   Medical History: Past Medical History:  Diagnosis Date   Acute ST elevation myocardial infarction (STEMI) of inferior wall ([place]) 03/01/2020   [facility]   Anxiety     Arthritis    Back (05/04/2018)   Carotid artery disease ([place])    a. 04/2018 s/p R carotid stenting.   Chronic lower back pain    "have it at night" (05/04/2018)   CKD (chronic kidney disease), stage III ([place])    Coronary artery disease    s/p CABG in 2003, patent grafts by cath in 2007, s/p inferior STEMI in 02/2020 with DES to RCA, s/p NSTEMI in 05/2020 with DES to proximal-RCA, s/p NSTEMI in 11/2020 with balloon angioplasty to mid-RCA and STEMI in 08/2021 with DESx3 to RCA   CVA (cerebral vascular accident) ([place]) 10/2017   "little numb on my left face since" (05/04/2018)   Depression    GERD (gastroesophageal reflux disease)    High triglycerides    History of kidney stones    Hyperlipidemia LDL goal <70    Hypertension    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 Patient with history of afib on apixaban prior to admit. New orders received to transition to IV heparin, will use stroke dosing protocol. CBC within normal limits. Last dose of apixaban this am.  Goal of Therapy:  Heparin level 0.3-0.5 units/ml Aptt goal 66-85s Monitor platelets by anticoagulation protocol: Yes   Plan:  Start heparin infusion at 900 units/hr -will start this evening since he took his apixaban this am Check anti-Xa level and aptt in 8 hours and daily while on heparin Continue to monitor H&H and platelets  Erin Hearing PharmD., BCPS Clinical Pharmacist 08/15/2022 3:30 PM

## 2022-08-15 NOTE — ED Notes (Addendum)
Wife confirmed that patient's left hand was numb yesterday not right. States that she was mixed up. Code stroke activated. Right hand strength and grip have improved. Patient is able to pick up pen, etc.

## 2022-08-15 NOTE — Consult Note (Addendum)
Stroke cart activated at [facility] provider paged at 1307 by telestroke RN Dr Erlinda Hong on screen at 1312 MRS 2 1433 Dr Erlinda Hong notified that CTA/P images were coming up and he was later called by the radiologist regarding CTA/P report.  Brandy RN called at [phone] and discussed plan of care.

## 2022-08-15 NOTE — Discharge Instructions (Signed)
[facility]  Discharge Instructions   Carotid Endarterectomy (CEA)  Please refer to the following instructions for your post-procedure care. Your surgeon or physician assistant will discuss any changes with you.  Activity  You are encouraged to walk as much as you can. You can slowly return to normal activities but must avoid strenuous activity and heavy lifting until your doctor tell you it's OK. Avoid activities such as vacuuming or swinging a golf club. You can drive after one week if you are comfortable and you are no longer taking prescription pain medications. It is normal to feel tired for serval weeks after your surgery. It is also normal to have difficulty with sleep habits, eating, and bowel movements after surgery. These will go away with time.  Bathing/Showering  You may shower after you come home. Do not soak in a bathtub, hot tub, or swim until the incision heals completely.  Incision Care  Shower every day. Clean your incision with mild soap and water. Pat the area dry with a clean towel. You do not need a bandage unless otherwise instructed. Do not apply any ointments or creams to your incision. You may have skin glue on your incision. Do not peel it off. It will come off on its own in about one week. Your incision may feel thickened and raised for several weeks after your surgery. This is normal and the skin will soften over time. For Men Only: It's OK to shave around the incision but do not shave the incision itself for 2 weeks. It is common to have numbness under your chin that could last for several months.  Diet  Resume your normal diet. There are no special food restrictions following this procedure. A low fat/low cholesterol diet is recommended for all patients with vascular disease. In order to heal from your surgery, it is CRITICAL to get adequate nutrition. Your body requires vitamins, minerals, and protein. Vegetables are the best  source of vitamins and minerals. Vegetables also provide the perfect balance of protein. Processed food has little nutritional value, so try to avoid this.        Medications  Resume taking all of your medications unless your doctor or physician assistant tells you not to. If your incision is causing pain, you may take over-the- counter pain relievers such as acetaminophen (Tylenol). If you were prescribed a stronger pain medication, please be aware these medications can cause nausea and constipation. Prevent nausea by taking the medication with a snack or meal. Avoid constipation by drinking plenty of fluids and eating foods with a high amount of fiber, such as fruits, vegetables, and grains. Do not take Tylenol if you are taking prescription pain medications.  Follow Up  Our office will schedule a follow up appointment 2-3 weeks following discharge.  Please call us immediately for any of the following conditions  Increased pain, redness, drainage (pus) from your incision site. Fever of 101 degrees or higher. If you should develop stroke (slurred speech, difficulty swallowing, weakness on one side of your body, loss of vision) you should call 911 and go to the nearest emergency room.  Reduce your risk of vascular disease:  Stop smoking. If you would like help call [facility] at 1-800-QUIT-NOW (607)666-0961) or [place] at [phone]. Manage your cholesterol Maintain a desired weight Control your diabetes Keep your blood pressure down  If you have any questions, please call the office at [phone].  Patient will be transferred immediately to [facility]  [facility] emergency department because of stroke.  Dr. Tamera Punt is the accepting physician and Dr. Trula Slade and Erlinda Hong seeing the pt

## 2022-08-15 NOTE — Consult Note (Signed)
[facility] [place]  Patient name: Kenneth [name] MRN: [id number] DOB: 1941/03/13 Sex: male   REQUESTING PROVIDER:    Dr. Erlinda Hong   REASON FOR CONSULT:    Symptomatic left carotid stenosis  HISTORY OF PRESENT ILLNESS:   Kenneth [name] is a 82 y.o. male, who presented to the [facility] emergency ER with right hand numbness and weakness.  Yesterday, he had left eye proptosis as well as left hand numbness which fully resolved.  This morning around 1030 he began complaining of right hand numbness and then had right hand weakness.  The patient was last seen in our office by Dr. Stanford Breed in 2022.  At that time he had a 80% right common carotid in-stent stenosis and a left carotid bifurcation stenosis of about 80%.  He was asymptomatic.  Because of his neck radiation and permanent tracheostomy as well as his age, medical management was recommended.  The patient is on Eliquis for atrial fibrillation.  He has a history of coronary artery disease, status post CABG.  He had right carotid stenting in 2013 and 2019.  He has a history of laryngeal cancer, status post permanent tracheostomy and radiation.  PAST MEDICAL HISTORY    Past Medical History:  Diagnosis Date   Acute on chronic respiratory failure with hypoxia ([place]) 09/06/2018   Acute ST elevation myocardial infarction (STEMI) of inferior wall ([place]) 03/01/2020   [facility]   Anxiety    Arthritis    Back (05/04/2018)   Carotid artery disease ([place])    a. 04/2018 s/p R carotid stenting.   Chronic lower back pain    "have it at night" (05/04/2018)   CKD (chronic kidney disease), stage III ([place])    Coronary artery disease    s/p CABG in 2003, patent grafts by cath in 2007, s/p inferior STEMI in 02/2020 with DES to RCA, s/p NSTEMI in 05/2020 with DES to proximal-RCA, s/p NSTEMI in 11/2020 with balloon angioplasty to mid-RCA and STEMI in 08/2021 with DESx3 to RCA    CVA (cerebral vascular accident) ([place]) 10/2017   "little numb on my left face since" (05/04/2018)   Depression    GERD (gastroesophageal reflux disease)    High triglycerides    History of kidney stones    Hyperlipidemia LDL goal <70    Hypertension    Hypertensive crisis 08/08/2022   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   Peripheral vascular disease ([place])    Pneumonia 11/11/2021   Sinus bradycardia    Stroke [facility])    "he's had several little strokes; many that he wasn't aware of" (05/04/2018)   Tobacco abuse    Tracheal stenosis      FAMILY HISTORY   Family History  Problem Relation Age of Onset   Dementia Mother    Heart disease Father    Cancer Brother    Heart disease Brother    Hyperlipidemia Brother    Cancer Brother    Colon cancer Neg Hx     SOCIAL HISTORY:   Social History   Socioeconomic History   Marital status: Married    Spouse name: Not on file   Number of children: Not on file   Years of education: Not on file   Highest education level: Not on file  Occupational History   Occupation: retired    Comment: Warden/ranger  Tobacco Use   Smoking status: Former    Packs/day: 1.00    Years: 15.00  Total pack years: 15.00    Types: Cigarettes    Start date: 04/22/1953    Quit date: 07/26/1970    Years since quitting: 52.0    Passive exposure: Never   Smokeless tobacco: Former    Types: Chew    Quit date: 05/05/2002  Vaping Use   Vaping Use: Never used  Substance and Sexual Activity   Alcohol use: Never    Alcohol/week: 0.0 standard drinks of alcohol   Drug use: Never   Sexual activity: Not Currently  Other Topics Concern   Not on file  Social History Narrative   Walks on the treadmill every other day at the [facility] for the last couple of weeks.         Social Determinants of Health   Financial Resource Strain: Not on file  Food Insecurity: No Food Insecurity (08/09/2022)   Hunger Vital Sign    Worried About Running Out of Food in the Last  Year: Never true    Ran Out of Food in the Last Year: Never true  Transportation Needs: No Transportation Needs (08/09/2022)   PRAPARE - Hydrologist (Medical): No    Lack of Transportation (Non-Medical): No  Physical Activity: Not on file  Stress: Not on file  Social Connections: Not on file  Intimate Partner Violence: Not At Risk (08/09/2022)   Humiliation, Afraid, Rape, and Kick questionnaire    Fear of Current or Ex-Partner: No    Emotionally Abused: No    Physically Abused: No    Sexually Abused: No    ALLERGIES:    Allergies  Allergen Reactions   Nifedical Xl [Nifedipine] Other (See Comments)    Unknown reaction   Ativan [Lorazepam] Other (See Comments)    Altered mental state   Penicillins Hives    Has patient had a PCN reaction causing immediate rash, facial/tongue/throat swelling, SOB or lightheadedness with hypotension: Yes Has patient had a PCN reaction causing severe rash involving mucus membranes or skin necrosis: No Has patient had a PCN reaction that required hospitalization No Has patient had a PCN reaction occurring within the last 10 years: No If all of the above answers are "NO", then may proceed with Cephalosporin use.  HAS TOLERATED: cephalexin   Sulfacetamide Other (See Comments)    Unknown reaction   Sulfonamide Derivatives Other (See Comments)    Unknown reaction   Crestor [Rosuvastatin] Itching, Swelling and Rash   Erythromycin Rash   Firvanq [Vancomycin] Itching   Statins Itching, Swelling and Rash    Rash on his trunk and arms, swelling in his lips    CURRENT MEDICATIONS:    Current Facility-Administered Medications  Medication Dose Route Frequency Provider Last Rate Last Admin   [START ON 08/16/2022]  stroke: early stages of recovery book   Does not apply Once Marcelyn Bruins, MD       0.9 %  sodium chloride infusion   Intravenous Continuous Marcelyn Bruins, MD 100 mL/hr at 08/15/22 1549 New Bag at  08/15/22 1549   acetaminophen (TYLENOL) tablet 650 mg  650 mg Oral Q4H PRN Marcelyn Bruins, MD       Or   acetaminophen (TYLENOL) 160 MG/5ML solution 650 mg  650 mg Per Tube Q4H PRN Marcelyn Bruins, MD       Or   acetaminophen (TYLENOL) suppository 650 mg  650 mg Rectal Q4H PRN Marcelyn Bruins, MD       [START ON 08/16/2022] clopidogrel (PLAVIX)  tablet 75 mg  75 mg Oral Daily Marcelyn Bruins, MD       [START ON 08/16/2022] ezetimibe (ZETIA) tablet 10 mg  10 mg Oral Daily Marcelyn Bruins, MD       heparin ADULT infusion 100 units/mL (25000 units/250m)  900 Units/hr Intravenous Continuous MMarcelyn Bruins MD 9 mL/hr at 08/15/22 1927 900 Units/hr at 08/15/22 1927   [START ON 08/16/2022] pantoprazole (PROTONIX) EC tablet 40 mg  40 mg Oral Daily MMarcelyn Bruins MD       [START ON 08/16/2022] PARoxetine (PAXIL) tablet 20 mg  20 mg Oral Daily MMarcelyn Bruins MD       senna-docusate (Senokot-S) tablet 1 tablet  1 tablet Oral QHS PRN MMarcelyn Bruins MD       Current Outpatient Medications  Medication Sig Dispense Refill   acetaminophen (TYLENOL) 500 MG tablet Take 500 mg by mouth every 6 (six) hours as needed for headache, fever, moderate pain or mild pain.     amLODipine (NORVASC) 10 MG tablet Take 1 tablet (10 mg total) by mouth daily. 30 tablet 1   apixaban (ELIQUIS) 2.5 MG TABS tablet Take 1 tablet (2.5 mg total) by mouth 2 (two) times daily. 120 tablet 0   carvedilol (COREG) 3.125 MG tablet Take 3 tablets (9.375 mg total) by mouth 2 (two) times daily with a meal. 180 tablet 0   clopidogrel (PLAVIX) 75 MG tablet Take 1 tablet (75 mg total) by mouth daily. 30 tablet 1   ezetimibe (ZETIA) 10 MG tablet Take 1 tablet by mouth once daily (Patient taking differently: Take 10 mg by mouth daily.) 90 tablet 1   isosorbide mononitrate (IMDUR) 30 MG 24 hr tablet Take 1 tablet (30 mg total) by mouth daily. 30 tablet 1   losartan (COZAAR) 25 MG tablet Take 1 tablet (25 mg  total) by mouth daily. 30 tablet 1   nitroGLYCERIN (NITROSTAT) 0.4 MG SL tablet Place 1 tablet (0.4 mg total) under the tongue every 5 (five) minutes as needed for chest pain. 25 tablet 3   pantoprazole (PROTONIX) 40 MG tablet Take 1 tablet by mouth once daily 90 tablet 2   PARoxetine (PAXIL) 20 MG tablet Take 20 mg by mouth daily.     PRALUENT 75 MG/ML SOAJ INJECT 1 PEN SUBCUTANEOUSLY EVERY TWO WEEKS (Patient taking differently: Inject 75 mg into the skin every 14 (fourteen) days.) 2 mL 6   spironolactone (ALDACTONE) 25 MG tablet Take 1 tablet (25 mg total) by mouth daily. 30 tablet 1    REVIEW OF SYSTEMS:   '[X]'$  denotes positive finding, '[ ]'$  denotes negative finding Cardiac  Comments:  Chest pain or chest pressure:    Shortness of breath upon exertion:    Short of breath when lying flat:    Irregular heart rhythm:        Vascular    Pain in calf, thigh, or hip brought on by ambulation:    Pain in feet at night that wakes you up from your sleep:     Blood clot in your veins:    Leg swelling:         Pulmonary    Oxygen at home:    Productive cough:     Wheezing:         Neurologic    Sudden weakness in arms or legs:  x   Sudden numbness in arms or legs:  x   Sudden onset of difficulty speaking or slurred speech:  Temporary loss of vision in one eye:     Problems with dizziness:         Gastrointestinal    Blood in stool:      Vomited blood:         Genitourinary    Burning when urinating:     Blood in urine:        Psychiatric    Major depression:         Hematologic    Bleeding problems:    Problems with blood clotting too easily:        Skin    Rashes or ulcers:        Constitutional    Fever or chills:     PHYSICAL EXAM:   Vitals:   08/15/22 1830 08/15/22 1907 08/15/22 1928 08/15/22 1930  BP: 110/89  107/73 129/84  Pulse: (!) 118 (!) 140  (!) 141  Resp: 19 (!) 34  18  Temp:      TempSrc:      SpO2: 97% 98%  98%  Weight:      Height:         GENERAL: The patient is a well-nourished male, in no acute distress. The vital signs are documented above. CARDIAC: There is a regular rate and rhythm.  PULMONARY: Nonlabored respirations MUSCULOSKELETAL: There are no major deformities or cyanosis. NEUROLOGIC: No focal weakness or paresthesias are detected. SKIN: There are no ulcers or rashes noted. PSYCHIATRIC: The patient has a normal affect.  STUDIES:   I have reviewed the following CTA 1. Critical stenosis of the left proximal ICA in the neck with trace (approximately 1 mm diameter) luminal opacification. Findings are mildly progressed from the prior. 2. On CT perfusion, approximately 127 mL of the reported penumbra in the left cerebrum which likely relates to the above. No core infarct identified. 3. Right common carotid artery and internal carotid artery stent. Moderate to severe stenosis of the common carotid artery stent with approximately 60-70% stenosis. A catheter arteriogram could more accurately quantify/characterize if clinically warranted. 4. 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 5. Moderate right intracranial ICA stenosis. 6. Moderate left and mild right P2 PCA stenosis.  ASSESSMENT and PLAN   Symptomatic left carotid stenosis: I discussed with the patient and his son who is at the bedside that the most likely explanation of his neurologic event was the high-grade left carotid stenosis.  In order to minimize his risk for future events, I have recommended carotid intervention.  Because of his prior neck radiation, I think carotid endarterectomy is not safe and so we would need to proceed with stenting.  His neck appears soft and I feel that TCAR would be an option, as with transfemoral stenting.  I feel that TCAR would have the lowest risk of stroke but could be complicated by his neck radiation.  At this point, I am considering TCAR on Tuesday.  Dr. Virl Cagey would be available to  do this, otherwise I would proceed with transfemoral stenting on Wednesday.  The patient's Eliquis has been discontinued.  He will remain on IV heparin.  He is already on Plavix.  I will start him on aspirin.  He is on Zetia as he cannot tolerate statins.   Leia Alf, MD, FACS [facility] [phone] Pager [phone]

## 2022-08-15 NOTE — ED Notes (Signed)
[facility] called to transfer patient ED to ED.

## 2022-08-15 NOTE — ED Notes (Signed)
MD in room to evaluate.

## 2022-08-15 NOTE — Consult Note (Signed)
[facility] Telemedicine Consult   Requesting Provider: Dr. 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  Per wife, patient yesterday 11 to 12 PM had left eye proptosis and left hand numbness which has been fully resolved.  This morning they went to church around 10:30 AM patient complaining of right hand numbness and then 12 PM patient was found to have right hand weakness during lunch.  Wife denies any facial droop or speech changes.  Patient was sent to ER for evaluation.  Patient did have bad headache yesterday but denies any headache at this time.  Glucose 139, BP 128/86.  CT no acute abnormality.  Patient has extensive history of bilateral carotid stenosis.  He had a follow-up with Dr. Stanford Breed in clinic.  Had right ICA stenting in 05/2012 and right CCA stenting in 04/2018, also has high-grade stenosis on the left ICA.  In 12/2020 CT head and neck showed right CCA 70% in-stent stenosis, right ICA 40% in-stent stenosis.  Left ICA 65% stenosis.  Carotid Doppler in 04/2021 showed right CCA stent 50 to 75% stenosis, however left ICA 80 to 99% stenosis.  He has left ICA stenosis so far asymptomatic, no procedure planned and the patient is undergoing surveillance with biannual carotid duplex.  Admitted in 11/2020 for episode of dizziness and gait instability.  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  Carotid Doppler showed left ICA 70 to 99% stenosis, however considered asymptomatic at that time.  Patient admitted 1 week ago for chest pain and hypertensive urgency.  Treated  with nitroglycerin drip.  Prior to discharge, he was found to have A-fib RVR, EF 55 to 60%.  Discharged on Eliquis.  Patient takes Eliquis at home, last dose this morning per wife.  LKW: 10:30 AM tpa given?: No, Eliquis this morning IR Thrombectomy? CTA pending Modified Rankin Scale: 2-Slight disability-UNABLE to perform all activities but does not need assistance   Exam: Vitals:   08/15/22 1255 08/15/22 1256  BP:    Pulse: 84 98  Resp: (!) 25 20  SpO2: 100% 100%     Pulse Rate:  [82-119] 98 (01/21 1256) Resp:  [19-26] 20 (01/21 1256) BP: (128)/(86) 128/86 (01/21 1249) SpO2:  [99 %-100 %] 100 % (01/21 1256)  General - Well nourished, well developed, in no apparent distress.  Ophthalmologic - fundi not visualized due to noncooperation.  Cardiovascular - irregularly irregular heart rate and rhythm.  Neuro - awake, alert, eyes open, orientated to place but not to age or time. No aphasia, trach in place, paucity of speech but following all simple commands. Able to name and repeat. No gaze palsy, tracking bilaterally, visual field testing not quite cooperative but seems no deficit. No facial droop. Tongue midline. Bilateral UEs proximal 5/5, no drift, but right hand grip 4/5 and decreased dexterity with tremor. Bilaterally LEs 5/5, no drift. Sensation symmetrical bilaterally except right hand decreased sensation, b/l FTN intact but slow, gait not tested.   NIH Stroke Scale  Level Of Consciousness 0=Alert; keenly responsive 1=Arouse to minor stimulation 2=Requires repeated stimulation to arouse or movements to pain 3=postures or unresponsive 0  LOC Questions to Month and Age 33=Answers both  questions correctly 1=Answers one question correctly or dysarthria/intubated/trauma/language barrier 2=Answers neither question correctly or aphasia 2  LOC Commands      -Open/Close eyes     -Open/close grip     -Pantomime commands if communication barrier 0=Performs both tasks  correctly 1=Performs one task correctly 2=Performs neighter task correctly 0  Best Gaze     -Only assess horizontal gaze 0=Normal 1=Partial gaze palsy 2=Forced deviation, or total gaze paresis 0  Visual 0=No visual loss 1=Partial hemianopia 2=Complete hemianopia 3=Bilateral hemianopia (blind including cortical blindness) 0  Facial Palsy     -Use grimace if obtunded 0=Normal symmetrical movement 1=Minor paralysis (asymmetry) 2=Partial paralysis (lower face) 3=Complete paralysis (upper and lower face) 0  Motor  0=No drift for 10/5 seconds 1=Drift, but does not hit bed 2=Some antigravity effort, hits  bed 3=No effort against gravity, limb falls 4=No movement 0=Amputation/joint fusion Right Arm 0     Leg 0    Left Arm 0     Leg 0  Limb Ataxia     - FNT/HTS 0=Absent or does not understand or paralyzed or amputation/joint fusion 1=Present in one limb 2=Present in two limbs 0  Sensory 0=Normal 1=Mild to moderate sensory loss 2=Severe to total sensory loss or coma/unresponsive 1  Best Language 0=No aphasia, normal 1=Mild to moderate aphasia 2=Severe aphasia 3=Mute, global aphasia, or coma/unresponsive 0  Dysarthria 0=Normal 1=Mild to moderate 2=Severe, unintelligible or mute/anarthric 0=intubated/unable to test 1  Extinction/Neglect 0=No abnormality 1=visual/tactile/auditory/spatia/personal inattention/Extinction to bilateral simultaneous stimulation 2=Profound neglect/extinction more than 1 modality  0  Total   4      Imaging Reviewed:  CT HEAD CODE STROKE WO CONTRAST  Result Date: 08/15/2022 CLINICAL DATA:  Code stroke.  Neuro deficit, acute, stroke suspected EXAM: CT HEAD WITHOUT CONTRAST TECHNIQUE: Contiguous axial images were obtained from the base of the skull through the vertex without intravenous contrast. RADIATION DOSE REDUCTION: This exam was performed according to the departmental dose-optimization program which includes automated exposure control, adjustment  of the mA and/or kV according to patient size and/or use of iterative reconstruction technique. COMPARISON:  January 20, 2021. FINDINGS: Brain: No evidence of acute large vascular territory infarction, hemorrhage, hydrocephalus, extra-axial collection or mass lesion/mass effect. Similar patchy areas of periventricular and subcortical white matter hypodensity, nonspecific but compatible with chronic microvascular ischemic disease. Vascular: No hyperdense vessel identified. Skull: No acute fracture Sinuses/Orbits: Clear sinuses.  No acute orbital findings. Other: No mastoid effusions. ASPECTS [facility] Stroke Program Early CT Score) total score (0-10 with 10 being normal): 10. IMPRESSION: 1. No evidence of acute intracranial abnormality.  ASPECTS is 10. 2. Similar chronic microvascular ischemic disease. Code stroke imaging results were communicated on 08/15/2022 at 1:18 pm to provider Dr. Roderic Palau via telephone, who verbally acknowledged these results. Electronically Signed   By: Margaretha Sheffield M.D.   On: 08/15/2022 13:19     Labs reviewed in epic and pertinent values follow: Cre 1.6. 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 He did have left eye ptosis and left hand numbness yesterday but fully resolved. NIHSS =4. Time onset today 10:30am. CT no acute finding. Pt not TNK candidate due to on eliquis this am. Needs to have stat CTA head and neck to rule out LVO  given known b/l carotid high grade stenosis. Will also need CTP to help tease out acute on chronic changes given b/l carotid high grade stenosis. Pt likely had small stroke on the left brain, will probably need further stroke and vascular  surgery work up in [facility]. Discussed with Dr. Roderic Palau   Recommendations:  Recommend transfer to [facility] for continued stroke and vascular surgery work up Frequent neuro checks Telemetry monitoring MRI brain  CTA head and neck with CTP stat Echocardiogram  UDS, fasting lipid panel and HgbA1C PT/OT/speech consult Permissive hypertension (only treat if BP > 180/105 as he is on eliquis) for 24-48 hours post stroke onset GI and DVT prophylaxis  OK to continue plavix if pass swallow, hold off eliquis for now until MRI done.  Discussed with Dr. Roderic Palau ED physician We will follow    Consult Participants: pt, wife, RN and stroke response RN and me Location of the provider: [facility] Location of the patient: [facility]  Time Code Stroke Page received:  1:10pm Time neurologist arrived:  1:12pm Time NIHSS completed: 1:30pm    This consult was provided via telemedicine with 2-way video and audio communication. The patient/family was informed that care would be provided in this way and agreed to receive care in this manner.   This patient is receiving care for possible acute neurological changes. There was 60 minutes of care by this provider at the time of service, including time for direct evaluation via telemedicine, review of medical records, imaging studies and discussion of findings with providers, the patient and/or family.  Rosalin Hawking, MD PhD Stroke Neurology 08/15/2022 1:45 PM

## 2022-08-15 NOTE — ED Notes (Signed)
I have called several times to give report. Nurse still needs time to get ready for pt, then he will be moved upstairs

## 2022-08-16 ENCOUNTER — Inpatient Hospital Stay (HOSPITAL_COMMUNITY): Payer: Medicare HMO

## 2022-08-16 DIAGNOSIS — I10 Essential (primary) hypertension: Secondary | ICD-10-CM | POA: Diagnosis not present

## 2022-08-16 DIAGNOSIS — I639 Cerebral infarction, unspecified: Secondary | ICD-10-CM

## 2022-08-16 DIAGNOSIS — I6523 Occlusion and stenosis of bilateral carotid arteries: Secondary | ICD-10-CM | POA: Diagnosis not present

## 2022-08-16 DIAGNOSIS — I4819 Other persistent atrial fibrillation: Secondary | ICD-10-CM | POA: Diagnosis not present

## 2022-08-16 LAB — COMPREHENSIVE METABOLIC PANEL
ALT: 18 U/L (ref 0–44)
AST: 18 U/L (ref 15–41)
Albumin: 3.1 g/dL — ABNORMAL LOW (ref 3.5–5.0)
Alkaline Phosphatase: 89 U/L (ref 38–126)
Anion gap: 8 (ref 5–15)
BUN: 17 mg/dL (ref 8–23)
CO2: 22 mmol/L (ref 22–32)
Calcium: 8.4 mg/dL — ABNORMAL LOW (ref 8.9–10.3)
Chloride: 106 mmol/L (ref 98–111)
Creatinine, Ser: 1.42 mg/dL — ABNORMAL HIGH (ref 0.61–1.24)
GFR, Estimated: 49 mL/min — ABNORMAL LOW (ref >60)
Glucose, Bld: 100 mg/dL — ABNORMAL HIGH (ref 70–99)
Potassium: 3.9 mmol/L (ref 3.5–5.1)
Sodium: 136 mmol/L (ref 135–145)
Total Bilirubin: 0.3 mg/dL (ref 0.3–1.2)
Total Protein: 6.2 g/dL — ABNORMAL LOW (ref 6.5–8.1)

## 2022-08-16 LAB — LIPID PANEL
Cholesterol: 102 mg/dL (ref 0–200)
HDL: 28 mg/dL — ABNORMAL LOW (ref >40)
LDL Cholesterol: 32 mg/dL (ref 0–99)
Total CHOL/HDL Ratio: 3.6 RATIO
Triglycerides: 208 mg/dL — ABNORMAL HIGH (ref <150)
VLDL: 42 mg/dL — ABNORMAL HIGH (ref 0–40)

## 2022-08-16 LAB — CBC
HCT: 35.3 % — ABNORMAL LOW (ref 39.0–52.0)
Hemoglobin: 11.7 g/dL — ABNORMAL LOW (ref 13.0–17.0)
MCH: 29.6 pg (ref 26.0–34.0)
MCHC: 33.1 g/dL (ref 30.0–36.0)
MCV: 89.4 fL (ref 80.0–100.0)
Platelets: 272 10*3/uL (ref 150–400)
RBC: 3.95 MIL/uL — ABNORMAL LOW (ref 4.22–5.81)
RDW: 15.7 % — ABNORMAL HIGH (ref 11.5–15.5)
WBC: 9.9 10*3/uL (ref 4.0–10.5)
nRBC: 0 % (ref 0.0–0.2)

## 2022-08-16 LAB — APTT
aPTT: 27 seconds (ref 24–36)
aPTT: 33 seconds (ref 24–36)

## 2022-08-16 LAB — HEPARIN LEVEL (UNFRACTIONATED): Heparin Unfractionated: 1.05 IU/mL — ABNORMAL HIGH (ref 0.30–0.70)

## 2022-08-16 MED ORDER — ACETAMINOPHEN 500 MG PO TABS
1000.0000 mg | ORAL_TABLET | Freq: Once | ORAL | Status: AC
  Administered 2022-08-16: 1000 mg via ORAL
  Filled 2022-08-16: qty 2

## 2022-08-16 MED ORDER — SODIUM CHLORIDE 0.9 % IV SOLN: INTRAVENOUS | Status: AC

## 2022-08-16 MED ORDER — CLOPIDOGREL BISULFATE 75 MG PO TABS
300.0000 mg | ORAL_TABLET | Freq: Once | ORAL | Status: AC
  Administered 2022-08-16: 300 mg via ORAL
  Filled 2022-08-16: qty 4

## 2022-08-16 MED ORDER — ASPIRIN 325 MG PO TABS
325.0000 mg | ORAL_TABLET | Freq: Once | ORAL | Status: AC
  Administered 2022-08-16: 325 mg via ORAL
  Filled 2022-08-16: qty 1

## 2022-08-16 MED ORDER — METOPROLOL TARTRATE 5 MG/5ML IV SOLN
2.5000 mg | Freq: Four times a day (QID) | INTRAVENOUS | Status: DC | PRN
  Administered 2022-08-16: 2.5 mg via INTRAVENOUS
  Filled 2022-08-16: qty 5

## 2022-08-16 NOTE — Progress Notes (Signed)
ANTICOAGULATION CONSULT NOTE - Initial Consult  Pharmacy Consult for heparin Indication: atrial fibrillation  Allergies  Allergen Reactions   Nifedical Xl [Nifedipine] Other (See Comments)    Unknown reaction   Ativan [Lorazepam] Other (See Comments)    Altered mental state   Penicillins Hives    Has patient had a PCN reaction causing immediate rash, facial/tongue/throat swelling, SOB or lightheadedness with hypotension: Yes Has patient had a PCN reaction causing severe rash involving mucus membranes or skin necrosis: No Has patient had a PCN reaction that required hospitalization No Has patient had a PCN reaction occurring within the last 10 years: No If all of the above answers are "NO", then may proceed with Cephalosporin use.  HAS TOLERATED: cephalexin   Sulfacetamide Other (See Comments)    Unknown reaction   Sulfonamide Derivatives Other (See Comments)    Unknown reaction   Crestor [Rosuvastatin] Itching, Swelling and Rash   Erythromycin Rash   Firvanq [Vancomycin] Itching   Statins Itching, Swelling and Rash    Rash on his trunk and arms, swelling in his lips    Patient Measurements: Height: '5\' 5"'$  (165.1 cm) Weight: 80.6 kg (177 lb 11.1 oz) IBW/kg (Calculated) : 61.5 Heparin Dosing Weight: 78kg  Vital Signs: Temp: 98.7 F (37.1 C) (01/22 1217) Temp Source: Oral (01/22 1217) BP: 127/74 (01/22 1217) Pulse Rate: 99 (01/22 1217)  Labs: Recent Labs    08/15/22 1304 08/15/22 1312 08/16/22 0354 08/16/22 1403  HGB 12.2* 13.3 11.7*  --   HCT 38.6* 39.0 35.3*  --   PLT 323  --  272  --   APTT 28  --  27 33  LABPROT 14.8  --   --   --   INR 1.2  --   --   --   HEPARINUNFRC  --   --  1.05*  --   CREATININE 1.60* 1.60* 1.42*  --      Estimated Creatinine Clearance: 38.5 mL/min (A) (by C-G formula based on SCr of 1.42 mg/dL (H)).   Medical History: Past Medical History:  Diagnosis Date   Acute on chronic respiratory failure with hypoxia ([place]) 09/06/2018    Acute ST elevation myocardial infarction (STEMI) of inferior wall ([place]) 03/01/2020   [facility]   Anxiety    Arthritis    Back (05/04/2018)   Carotid artery disease ([place])    a. 04/2018 s/p R carotid stenting.   Chronic lower back pain    "have it at night" (05/04/2018)   CKD (chronic kidney disease), stage III ([place])    Coronary artery disease    s/p CABG in 2003, patent grafts by cath in 2007, s/p inferior STEMI in 02/2020 with DES to RCA, s/p NSTEMI in 05/2020 with DES to proximal-RCA, s/p NSTEMI in 11/2020 with balloon angioplasty to mid-RCA and STEMI in 08/2021 with DESx3 to RCA   CVA (cerebral vascular accident) ([place]) 10/2017   "little numb on my left face since" (05/04/2018)   Depression    GERD (gastroesophageal reflux disease)    High triglycerides    History of kidney stones    Hyperlipidemia LDL goal <70    Hypertension    Hypertensive crisis 08/08/2022   Laryngeal carcinoma ([place]) 1998   Peripheral vascular disease ([place])    Pneumonia 11/11/2021   Sinus bradycardia    Stroke [facility])    "he's had several little strokes; many that he wasn't aware of" (05/04/2018)   Tobacco abuse    Tracheal stenosis  Assessment: 82 year old male admitted under code stroke protocol. Patient with history of afib on apixaban prior to admit. New orders received to transition to IV heparin, will use stroke dosing protocol. CBC within normal limits.   PTT came back subtherapeutic again. We will increase rate again and check another level. Levels are not correlating yet.   Goal of Therapy:  Heparin level 0.3-0.5 units/ml Aptt goal 66-85s Monitor platelets by anticoagulation protocol: Yes   Plan:  Increase heparin infusion to 1300 units/hr Check anti-Xa level and aptt in 8 hours and daily while on heparin Continue to monitor H&H and platelets  Onnie Boer, PharmD, BCIDP, AAHIVP, CPP Infectious Disease Pharmacist 08/16/2022 3:39 PM

## 2022-08-16 NOTE — Anesthesia Preprocedure Evaluation (Addendum)
Anesthesia Evaluation  Patient identified by MRN, date of birth, ID band Patient awake    Reviewed: Allergy & Precautions, H&P , NPO status , Patient's Chart, lab work & pertinent test results, reviewed documented beta blocker date and time   Airway Mallampati: Trach  TM Distance: >3 FB Neck ROM: Full    Dental no notable dental hx. (+) Teeth Intact, Dental Advisory Given   Pulmonary neg pulmonary ROS, former smoker   Pulmonary exam normal breath sounds clear to auscultation       Cardiovascular Exercise Tolerance: Good hypertension, Pt. on medications and Pt. on home beta blockers + CAD, + Past MI, + CABG, + Peripheral Vascular Disease and + DOE   Rhythm:Regular Rate:Normal     Neuro/Psych   Anxiety Depression    TIACVA, Residual Symptoms    GI/Hepatic Neg liver ROS,GERD  Medicated,,  Endo/Other  negative endocrine ROS    Renal/GU Renal InsufficiencyRenal disease  negative genitourinary   Musculoskeletal  (+) Arthritis , Osteoarthritis,    Abdominal   Peds  Hematology negative hematology ROS (+)   Anesthesia Other Findings   Reproductive/Obstetrics negative OB ROS                             Anesthesia Physical Anesthesia Plan  ASA: 3  Anesthesia Plan: General   Post-op Pain Management: Tylenol PO (pre-op)*   Induction: Intravenous  PONV Risk Score and Plan: 3 and Ondansetron, Dexamethasone and Treatment may vary due to age or medical condition  Airway Management Planned: Oral ETT and Tracheostomy  Additional Equipment: Arterial line  Intra-op Plan:   Post-operative Plan: Extubation in OR  Informed Consent: I have reviewed the patients History and Physical, chart, labs and discussed the procedure including the risks, benefits and alternatives for the proposed anesthesia with the patient or authorized representative who has indicated his/her understanding and acceptance.      Dental advisory given  Plan Discussed with: CRNA  Anesthesia Plan Comments:        Anesthesia Quick Evaluation

## 2022-08-16 NOTE — Progress Notes (Signed)
PT Cancellation Note  Patient Details Name: Kenneth [name] MRN: [id number] DOB: [birth year]   Cancelled Treatment:    Reason Eval/Treat Not Completed: Active bedrest order  Will continue to monitor and proceed with evaluation when activity status increased.    [place]  Office [phone]  Rexanne Mano 08/16/2022, 8:02 AM

## 2022-08-16 NOTE — Plan of Care (Signed)

## 2022-08-16 NOTE — Progress Notes (Addendum)
ANTICOAGULATION CONSULT NOTE - Follow Up Consult  Pharmacy Consult for heparin Indication: atrial fibrillation  Labs: Recent Labs    08/15/22 1304 08/15/22 1312 08/16/22 0354  HGB 12.2* 13.3 11.7*  HCT 38.6* 39.0 35.3*  PLT 323  --  272  APTT 28  --   --   LABPROT 14.8  --   --   INR 1.2  --   --   HEPARINUNFRC  --   --  1.05*  CREATININE 1.60* 1.60* 1.42*    Assessment: 81yo male subtherapeutic on heparin with initial dosing while Eliquis on hold; PTT result is 27 per verbal lab report, not crossing to Epic; RN reports no infusion issues or signs of bleeding.  Goal of Therapy:  aPTT 66-85 seconds   Plan:  Will increase heparin infusion by 2-3 units/kg/hr to 1100 units/hr and check level in 8 hours.    Wynona Neat, PharmD, BCPS  08/16/2022,6:02 AM

## 2022-08-16 NOTE — Progress Notes (Signed)
[facility] PROGRESS NOTE   Kenneth [name] ELF:[id number] DOB: 08-09-1940 DOA: 08/15/2022  PCP: [name], MD  Brief History/Interval Summary: 82 y.o. 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  Neurology was consulted.  Patient was transferred to [facility] due to known history of critical//severe carotid artery disease.  Consultants: Neurology.  Vascular surgery  Procedures: Echocardiogram was done recently    Subjective/Interval History: Patient has a tracheostomy and so has difficulty communicating.  He denies any symptoms at this time.  Denies any chest pain shortness of breath.  No nausea or vomiting.    Assessment/Plan:  Acute CVA Patient presented with left hand numbness and right hand weakness.  Symptoms resolved. MRI showed multiple infarcts suggesting watershed etiology. LDL 32.  HbA1c pending Echocardiogram was done recently which showed normal systolic function of the left ventricle.  Right ventricular function was noted to be mildly low. Neurology is following. Patient is on Plavix and Zetia. PT and OT evaluation.  SLP evaluation.  Carotid artery disease Severe/critical disease of the left ICA was noted on CT angiogram.  Previous stent noted in the right carotid artery with moderate to severe disease. Vascular surgery consulted.  Plan is for intervention during this admission. Avoid hypotension. Currently on IV heparin.  Atrial fibrillation, persistent Looks like this was diagnosed recently.  Seen by cardiology last week for the same.  Patient was started on Eliquis. Eliquis currently on hold due to need for vascular intervention.  Beta-blocker on hold to avoid hypotension. Heart rate seems to be poorly controlled this  morning Will use IV metoprolol as needed. Monitor on telemetry.  May need to consider amiodarone if he remains persistently tachycardic.  History of laryngeal cancer with chronic tracheostomy Radiation treatment previously.  Has chronic tracheostomy. Hyperlipidemia/coronary artery disease Status post CABG Continue Zetia.  Beta-blocker on hold currently.  Chronic kidney disease stage IIIa Stable.  Monitor renal function.  Monitor urine output.  Avoid avoid nephrotoxic agents.  History of depression Continue Paxil  DVT Prophylaxis: On IV heparin Code Status: Full code Family Communication: Discussed with the patient Disposition Plan: Hopefully return home in improved  Status is: Inpatient Remains inpatient appropriate because: Acute stroke    Medications: Scheduled:   stroke: early stages of recovery book   Does not apply Once   clopidogrel  75 mg Oral Daily   ezetimibe  10 mg Oral Daily   pantoprazole  40 mg Oral Daily   PARoxetine  20 mg Oral Daily   Continuous:  sodium chloride Stopped (08/16/22 0016)   heparin 1,100 Units/hr (08/16/22 0624)   HEN:IDPOEUMPNTIRW **OR** acetaminophen (TYLENOL) oral liquid 160 mg/5 mL **OR** acetaminophen, metoprolol tartrate, senna-docusate  Antibiotics: Anti-infectives (From admission, onward)    None       Objective:  Vital Signs  Vitals:   08/16/22 0344 08/16/22 0434 08/16/22 0729 08/16/22 0809  BP:  116/80 111/81   Pulse: (!) 111 88 (!) 107 82  Resp:  '20 17 18  '$ Temp:  98.9 F (37.2 C) 98.8 F (37.1 C)   TempSrc:  Oral Oral   SpO2: 96% 94% 95% 94%  Weight:      Height:        Intake/Output Summary (Last 24 hours) at 08/16/2022 0958 Last data filed at 08/16/2022 0624 Gross per 24 hour  Intake 890.2  ml  Output 1675 ml  Net -784.8 ml   Filed Weights   08/15/22 1500  Weight: 80.6 kg    General appearance: Awake alert.  In no distress Tracheostomy noted Resp: Clear to auscultation bilaterally.  Normal  effort Cardio: S1-S2 is normal regular.  No S3-S4.  No rubs murmurs or bruit GI: Abdomen is soft.  Nontender nondistended.  Bowel sounds are present normal.  No masses organomegaly Extremities: No edema.  Full range of motion of lower extremities. Neurologic: Alert and oriented x3.  No focal neurological deficits.    Lab Results:  Data Reviewed: I have personally reviewed following labs and reports of the imaging studies  CBC: Recent Labs  Lab 08/11/22 0421 08/15/22 1304 08/15/22 1312 08/16/22 0354  WBC 9.6 9.1  --  9.9  NEUTROABS 6.5 6.3  --   --   HGB 12.2* 12.2* 13.3 11.7*  HCT 38.0* 38.6* 39.0 35.3*  MCV 90.3 91.7  --  89.4  PLT 232 323  --  301    Basic Metabolic Panel: Recent Labs  Lab 08/15/22 1304 08/15/22 1312 08/16/22 0354  NA 134* 137 136  K 4.3 4.5 3.9  CL 101 102 106  CO2 24  --  22  GLUCOSE 125* 127* 100*  BUN '22 22 17  '$ CREATININE 1.60* 1.60* 1.42*  CALCIUM 8.7*  --  8.4*    GFR: Estimated Creatinine Clearance: 38.5 mL/min (A) (by C-G formula based on SCr of 1.42 mg/dL (H)).  Liver Function Tests: Recent Labs  Lab 08/15/22 1304 08/16/22 0354  AST 23 18  ALT 22 18  ALKPHOS 103 89  BILITOT 0.4 0.3  PROT 7.2 6.2*  ALBUMIN 3.6 3.1*     Coagulation Profile: Recent Labs  Lab 08/15/22 1304  INR 1.2     CBG: Recent Labs  Lab 08/15/22 1250  GLUCAP 139*    Lipid Profile: Recent Labs    08/16/22 0354  CHOL 102  HDL 28*  LDLCALC 32  TRIG 208*  CHOLHDL 3.6     Recent Results (from the past 240 hour(s))  MRSA Next Gen by PCR, Nasal     Status: None   Collection Time: 08/09/22  6:50 PM   Specimen: Nasal Mucosa; Nasal Swab  Result Value Ref Range Status   MRSA by PCR Next Gen NOT DETECTED NOT DETECTED Final    Comment: (NOTE) The GeneXpert MRSA Assay (FDA approved for NASAL specimens only), is one component of a comprehensive MRSA colonization surveillance program. It is not intended to diagnose MRSA infection nor to  guide or monitor treatment for MRSA infections. Test performance is not FDA approved in patients less than 79 years old. Performed at [facility], [address], [place], [place] [address]       Radiology Studies: CT ANGIO HEAD NECK W WO CM W PERF (CODE STROKE)  Result Date: 08/15/2022 CLINICAL DATA:  Neuro deficit, acute, stroke suspected EXAM: CT ANGIOGRAPHY HEAD AND NECK CT PERFUSION BRAIN TECHNIQUE: Multidetector CT imaging of the head and neck was performed using the standard protocol during bolus administration of intravenous contrast. Multiplanar CT image reconstructions and MIPs were obtained to evaluate the vascular anatomy. Carotid stenosis measurements (when applicable) are obtained utilizing NASCET criteria, using the distal internal carotid diameter as the denominator. Multiphase CT imaging of the brain was performed following IV bolus contrast injection. Subsequent parametric perfusion maps were calculated using RAPID software. RADIATION DOSE REDUCTION: This exam was performed according to the departmental dose-optimization program which  includes automated exposure control, adjustment of the mA and/or kV according to patient size and/or use of iterative reconstruction technique. CONTRAST:  169m OMNIPAQUE IOHEXOL 350 MG/ML SOLN COMPARISON:  None Available. Same day CT head.  CTA head/neck January 20, 2021. FINDINGS: CTA NECK FINDINGS Aortic arch: Great vessel origins are patent. Right carotid system: Stenting of the common carotid artery and the proximal internal carotid artery. The stent is patent. There is moderate (approximately 60-70% stenosis in the common carotid stent). Left carotid system: Common carotid artery is patent. Critical stenosis of the left proximal ICA in the neck with trace (approximately 1 mm diameter) luminal opacification. Findings are mildly progressed from the prior. There is opacification of the more distal ICA in the neck. Vertebral arteries: Patent bilaterally. No  evidence of hemodynamically significant stenosis in the neck. Skeleton: Multilevel degenerative change. No acute findings on limited assessment. Other neck: No acute findings on limited assessment. A dedicated CT neck could better assess for malignancy. Upper chest: Visualized lung apices are largely clear with evidence of expiratory changes. Review of the MIP images confirms the above findings CTA HEAD FINDINGS Anterior circulation: Bilateral intracranial ICAs are patent. Moderate stenosis of the right cavernous and paraclinoid ICA. Bilateral MCAs and ACAs are patent. Posterior circulation: Chronically severely stenotic versus occluded right intradural vertebral artery. Left intradural vertebral artery is patent with moderate stenosis. Basilar artery and bilateral posterior cerebral arteries are patent. Mild right and moderate left P2 PCA stenosis. Venous sinuses: As permitted by contrast timing, patent. Review of the MIP images confirms the above findings CT Brain Perfusion Findings: ASPECTS: 10 CBF (<30%) Volume: 055mPerfusion (Tmax>6.0s) volume: 12765mismatch Volume: 127m74mfarction Location:None identified. IMPRESSION: 1. Critical stenosis of the left proximal ICA in the neck with trace (approximately 1 mm diameter) luminal opacification. Findings are mildly progressed from the prior. 2. On CT perfusion, approximately 127 mL of the reported penumbra in the left cerebrum which likely relates to the above. No core infarct identified. 3. Right common carotid artery and internal carotid artery stent. Moderate to severe stenosis of the common carotid artery stent with approximately 60-70% stenosis. A catheter arteriogram could more accurately quantify/characterize if clinically warranted. 4. 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 5. Moderate right intracranial ICA stenosis. 6. Moderate left and mild right P2 PCA stenosis. Findings discussed with Dr. Xu vErlinda Hong telephone at 2:35  p.m. Electronically Signed   By: FredMargaretha Sheffield.   On: 08/15/2022 14:40   CT HEAD CODE STROKE WO CONTRAST  Result Date: 08/15/2022 CLINICAL DATA:  Code stroke.  Neuro deficit, acute, stroke suspected EXAM: CT HEAD WITHOUT CONTRAST TECHNIQUE: Contiguous axial images were obtained from the base of the skull through the vertex without intravenous contrast. RADIATION DOSE REDUCTION: This exam was performed according to the departmental dose-optimization program which includes automated exposure control, adjustment of the mA and/or kV according to patient size and/or use of iterative reconstruction technique. COMPARISON:  January 20, 2021. FINDINGS: Brain: No evidence of acute large vascular territory infarction, hemorrhage, hydrocephalus, extra-axial collection or mass lesion/mass effect. Similar patchy areas of periventricular and subcortical white matter hypodensity, nonspecific but compatible with chronic microvascular ischemic disease. Vascular: No hyperdense vessel identified. Skull: No acute fracture Sinuses/Orbits: Clear sinuses.  No acute orbital findings. Other: No mastoid effusions. ASPECTS ([facility] Early CT Score) total score (0-10 with 10 being normal): 10. IMPRESSION: 1. No evidence of acute intracranial abnormality.  ASPECTS is 10. 2. Similar chronic microvascular ischemic disease.  Code stroke imaging results were communicated on 08/15/2022 at 1:18 pm to provider Dr. Roderic Palau via telephone, who verbally acknowledged these results. Electronically Signed   By: Margaretha Sheffield M.D.   On: 08/15/2022 13:19       LOS: 1 day   [facility] Pager on [email or web address]  08/16/2022, 9:58 AM

## 2022-08-16 NOTE — Evaluation (Signed)
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   SOB (shortness of breath) 09/05/2018   Carotid stenosis, symptomatic, with infarction ([place]) 05/04/2018   History of CVA (cerebrovascular accident) 11/15/2017   TIA (transient ischemic attack) 11/14/2017   Constipation 09/15/2017   Chest pain 10/06/2016   Essential hypertension 10/06/2016   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   DOE (dyspnea on exertion) 12/05/2015   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   Aftercare following surgery of the circulatory system, NEC 08/03/2013   Carotid arterial disease ([place]) 07/21/2012   Preop cardiovascular exam 05/08/2012   Carotid stenosis, bilateral 05/05/2012   CAD S/P percutaneous coronary angioplasty 08/13/2011   Malignant neoplasm of larynx ([place]) 12/14/2009   Mixed hyperlipidemia 12/14/2009   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 04/2018 s/p R carotid stenting.   Chronic lower back pain    "have it at night" (05/04/2018)   CKD (chronic kidney disease), stage III ([place])    Coronary artery disease    s/p CABG in 2003,  patent grafts by cath in 2007, s/p inferior STEMI in 02/2020 with DES to RCA, s/p NSTEMI in 05/2020 with DES to proximal-RCA, s/p NSTEMI in 11/2020 with balloon angioplasty to mid-RCA and STEMI in 08/2021 with DESx3 to RCA   CVA (cerebral vascular accident) ([place]) 10/2017   "little numb on my left face since" (05/04/2018)   Depression    GERD (gastroesophageal reflux disease)    High triglycerides    History of kidney stones    Hyperlipidemia LDL goal <70    Hypertension    Hypertensive crisis 08/08/2022   Laryngeal carcinoma ([place]) 1998   Peripheral vascular disease ([place])    Pneumonia 11/11/2021   Sinus bradycardia    Stroke [facility])    "he's had several little strokes; many that he wasn't aware of" (05/04/2018)   Tobacco abuse    Tracheal stenosis    Past Surgical History:  Past Surgical History:  Procedure Laterality Date   CAROTID STENT Right 06-13-12; 05/04/2018   CAROTID STENT INSERTION N/A 06/13/2012   Procedure: CAROTID STENT INSERTION;  Surgeon: Serafina Mitchell, MD;  Location: [place] CATH LAB;  Service: Cardiovascular;  Laterality: N/A;   CATARACT EXTRACTION, BILATERAL Bilateral    COLONOSCOPY  11/10/2011   Dr. [name]: hemorrhoids, tubular adenoma   CORNEAL TRANSPLANT Bilateral    CORONARY ARTERY BYPASS GRAFT  ~ 2003   "CABG X3"   CORONARY BALLOON ANGIOPLASTY N/A 11/26/2020   Procedure: CORONARY BALLOON ANGIOPLASTY;  Surgeon: Troy Sine, MD;  Location: [place] CV LAB;  Service: Cardiovascular;  Laterality: N/A;   CORONARY STENT INTERVENTION N/A 06/05/2020  Procedure: CORONARY STENT INTERVENTION;  Surgeon: Lorretta Harp, MD;  Location: [place] CV LAB;  Service: Cardiovascular;  Laterality: N/A;   EYE SURGERY     INSERTION OF RETROGRADE CAROTID STENT Right 05/04/2018   Procedure: INSERTION OF RIGHT  CAROTID STENT using an ABBOT- XACT carotid stent system;  Surgeon: Serafina Mitchell, MD;  Location: [place];  Service: Vascular;  Laterality: Right;   LAPAROSCOPIC  CHOLECYSTECTOMY  2007   LEFT HEART CATH AND CORS/GRAFTS ANGIOGRAPHY N/A 10/07/2016   Procedure: Left Heart Cath and Cors/Grafts Angiography;  Surgeon: Troy Sine, MD;  Location: [place] CV LAB;  Service: Cardiovascular;  Laterality: N/A;   LEFT HEART CATH AND CORS/GRAFTS ANGIOGRAPHY N/A 06/05/2020   Procedure: LEFT HEART CATH AND CORS/GRAFTS ANGIOGRAPHY;  Surgeon: Lorretta Harp, MD;  Location: [place] CV LAB;  Service: Cardiovascular;  Laterality: N/A;   LEFT HEART CATH AND CORS/GRAFTS ANGIOGRAPHY N/A 11/26/2020   Procedure: LEFT HEART CATH AND CORS/GRAFTS ANGIOGRAPHY;  Surgeon: Troy Sine, MD;  Location: [place] CV LAB;  Service: Cardiovascular;  Laterality: N/A;   PARTIAL LARYNGECTOMY  1998   TRACHEOSTOMY  03/13/2018   "@ [facility]"   HPI:  [name] is a 82 y.o. 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 Patient initially noticed numbness in his left fifth digit yesterday for a few minutes. This recurred today and was followed by right hand weakness. He states this lasted for several hours today. Neurology was consulted at [facility] where patient initially presented via teleneurology. Due to patient's known severe/critical carotid artery disease he was recommended to be transferred to [facility] for further evaluation and treatment as well as vascular surgery evaluation.MRI head 08/16/21 indicated Numerous foci of acute infarction in the left cerebral hemisphere at the frontoparietal vertex consistent with watershed distribution infarctions. No large confluent infarction. No evidence of hemorrhage. 2. Old left occipital cortical and subcortical infarction. BSE/SLE generated to assess swallow function/cognitive/linguistic skills in setting of chronic trach/new  onset CVA being r/o.   Assessment / Plan / Recommendation Clinical Impression  Pt assessed via SLP clinical observation/portions of [place] Mental Status Examination (with no total score obtained) and family/pt report.  Pt's speech breathy/low vocal intensity (when not occluded, as pt has chronic tracheotomy), but able to express wants/needs without apparent difficulty.  Baseline memory recall deficits per family report s/p laryngeal CA/radiation and chronic tracheotomy.  Pt's cognition appears at baseline level with memory deficits and sustained attention decreased with basic tasks on SLUMS.  Pt oriented to self, place and situation, but not time.  Pt able to follow simple directives during oral motor examination with unremarkable examination noted.  R hand paresthesia resolved as pt self-fed during BSE completion.  Visual deficits impacting performance as pt's visual acuity/peripheral vision impacted from CVA, which may also impact visual attention for tasks.  ST will f/u for new cognitive/visual-perceptual deficits and ongoing cognitive assessment during acute stay.  Thank you for this consult.    SLP Assessment  SLP Recommendation/Assessment: Patient needs continued Speech Lanaguage Pathology Services SLP Visit Diagnosis: Cognitive communication deficit (R41.841);Attention and concentration deficit Attention and concentration deficit following: Cerebral infarction    Recommendations for follow up therapy are one component of a multi-disciplinary discharge planning process, led by the attending physician.  Recommendations may be updated based on  patient status, additional functional criteria and insurance authorization.    Follow Up Recommendations  No SLP follow up    Assistance Recommended at Discharge  Intermittent Supervision/Assistance  Functional Status Assessment Patient has had a recent decline in their functional status and demonstrates the ability to make significant  improvements in function in a reasonable and predictable amount of time.  Frequency and Duration min 1 x/week  1 week      SLP Evaluation Cognition  Overall Cognitive Status: Difficult to assess Arousal/Alertness: Awake/alert Orientation Level: Oriented to person;Oriented to place;Disoriented to time;Disoriented to situation Attention: Sustained Sustained Attention: Impaired Sustained Attention Impairment: Verbal basic;Functional basic Memory: Impaired Memory Impairment: Retrieval deficit;Decreased recall of new information;Decreased short term memory Decreased Short Term Memory: Verbal basic;Functional basic Awareness: Appears intact Behaviors: Other (comment) (HOH/visual deficits impacting responses/processing) Safety/Judgment: Appears intact       Comprehension  Auditory Comprehension Overall Auditory Comprehension: Impaired Yes/No Questions: Within Functional Limits Commands: Impaired Multistep Basic Commands: 25-49% accurate Conversation: Simple Interfering Components: Visual impairments;Attention;Processing speed;Working memory EffectiveTechniques: Extra processing time;Pausing;Repetition;Slowed speech;Visual/Gestural cues Visual Recognition/Discrimination Discrimination: Exceptions to [facility] Other Visual Recogniton/Discrimination Comments: new visual (peripheral) deficits on R Reading Comprehension Reading Status: Impaired Functional Environmental (signs, name badge): Within functional limits Interfering Components: Visual acuity Effective Techniques: Visual cueing    Expression Expression Primary Mode of Expression: Verbal Verbal Expression Overall Verbal Expression: Impaired at baseline Level of Generative/Spontaneous Verbalization: Conversation Naming: Not tested Pragmatics: Unable to assess Interfering Components: Premorbid deficit;Tracheostomy without PMSV Non-Verbal Means of Communication: Not applicable Written Expression Dominant Hand: Right Written  Expression: Not tested   Oral / Motor  Oral Motor/Sensory Function Overall Oral Motor/Sensory Function: Within functional limits Motor Speech Overall Motor Speech: Appears within functional limits for tasks assessed Respiration: Impaired Level of Impairment: Sentence Phonation: Breathy;Low vocal intensity;Other (comment) (in setting of chronic trach/doesn't always occlude) Resonance: Within functional limits Articulation: Impaired Level of Impairment: Conversation Intelligibility: Intelligibility reduced Word: 75-100% accurate Phrase: 75-100% accurate Sentence: 75-100% accurate Conversation: 75-100% accurate Motor Planning: Witnin functional limits Motor Speech Errors: Not applicable Interfering Components: Premorbid status            Pat Jadwiga Faidley,M.S., CCC-SLP 08/16/2022, 11:10 AM

## 2022-08-16 NOTE — Progress Notes (Signed)
  Transition of Care [facility]) Screening Note   Patient Details  Name: Kenneth [name] Date of Birth: [birth year]   Transition of Care [facility]) CM/SW Contact:    Pollie Friar, RN Phone Number: 08/16/2022, 3:50 PM   Pt is from home with spouse. Awaiting therapy evals. Plan is for TCAR tomorrow or Wednesday.  Transition of Care Department [facility]) has reviewed patient. We will continue to monitor patient advancement through interdisciplinary progression rounds. If new patient transition needs arise, please place a TOC consult.

## 2022-08-16 NOTE — Progress Notes (Addendum)
Progress Note    08/16/2022 7:19 AM * No surgery found *  Subjective:  no changes overnight   Vitals:   08/16/22 0344 08/16/22 0434  BP:  116/80  Pulse: (!) 111 88  Resp:  20  Temp:  98.9 F (37.2 C)  SpO2: 96% 94%   Physical Exam: Cardiac:  regular Lungs:  non labored Extremities:  moving all extremities  Neurologic: alert and oriented  CBC    Component Value Date/Time   WBC 9.9 08/16/2022 0354   RBC 3.95 (L) 08/16/2022 0354   HGB 11.7 (L) 08/16/2022 0354   HCT 35.3 (L) 08/16/2022 0354   PLT 272 08/16/2022 0354   MCV 89.4 08/16/2022 0354   MCH 29.6 08/16/2022 0354   MCHC 33.1 08/16/2022 0354   RDW 15.7 (H) 08/16/2022 0354   LYMPHSABS 1.9 08/15/2022 1304   MONOABS 0.6 08/15/2022 1304   EOSABS 0.2 08/15/2022 1304   BASOSABS 0.1 08/15/2022 1304    BMET    Component Value Date/Time   NA 136 08/16/2022 0354   K 3.9 08/16/2022 0354   CL 106 08/16/2022 0354   CO2 22 08/16/2022 0354   GLUCOSE 100 (H) 08/16/2022 0354   BUN 17 08/16/2022 0354   CREATININE 1.42 (H) 08/16/2022 0354   CREATININE 1.35 (H) 11/10/2018 1547   CALCIUM 8.4 (L) 08/16/2022 0354   GFRNONAA 49 (L) 08/16/2022 0354   GFRAA 51 (L) 10/18/2018 0624    INR    Component Value Date/Time   INR 1.2 08/15/2022 1304     Intake/Output Summary (Last 24 hours) at 08/16/2022 0719 Last data filed at 08/16/2022 2836 Gross per 24 hour  Intake 890.2 ml  Output 1675 ml  Net -784.8 ml     Assessment/Plan:  82 y.o. male with symptomatic left ICA stenosis  No new neurological symptoms Does also have high grade right ICA stenosis. Presently not thought to be symptomatic. Will follow as outpatient  Discussed again TCAR with patient. He did not have any further questions Timing of surgery still pending either 1/23 with Dr. Virl Cagey or 1/24 with Dr. Trula Slade Will update patient when definitve OR availability determined  Karoline Caldwell, PA-C [facility] [phone] 08/16/2022 7:19  AM  VASCULAR STAFF ADDENDUM: I have independently interviewed and examined the patient. I agree with the above.  I met Kenneth [name] earlier today and had a long, detailed discussion regarding his left-sided symptomatic carotid stenosis.  There are multiple treatment modalities, however in the setting of tracheostomy, irradiated neck, I think the 2 best treatment options are TCAR versus transfemoral stenting.  In evaluating the lesion, there is significant disease in the common carotid artery, that does not appear to be flow-limiting, but is concerning for embolic risk.  I looked at the images with multiple partners in an effort to best quantify falls risk for both transcarotid artery stenting versus transfemoral stenting.  In short I think that transcarotid artery revascularization carries a lower stroke rate due to flow reversal, however in the setting of a radiated field, cranial nerve injury risk, poor healing risk.  The tracheostomy also increases the infection risk.  Transfemoral stenting carries no cranial nerve risk, no infection risk, no poor wound healing risk, but does carry a higher stroke risk. Regardless both carry a higher than normal stroke risk due to the common carotid lesion.   Both myself and my partners agree that transcarotid artery revascularization is overall the safer procedure.  I had a detailed discussion with Kenneth [name] outlining the  risks and benefits of both.  After discussing these, he elected to proceed with left-sided transcarotid artery revascularization.  This is planned for tomorrow morning at 730.     Please administer 300 mg of Plavix this evening along with a 325 aspirin.   Tomorrow morning, he will needs to receive 75 Plavix, 81 mg aspirin.   Cassandria Santee, MD [facility] Phone Number: [phone] 08/16/2022 3:27 PM

## 2022-08-16 NOTE — Progress Notes (Signed)
OT Cancellation Note  Patient Details Name: Kenneth [name] MRN: [id number] DOB: 08-12-1940   Cancelled Treatment:    Reason Eval/Treat Not Completed: Active bedrest order Patient with active bed rest order, OT will follow back to complete evaluation once medically appropriate.   Corinne Ports E. Ghalia Reicks, OTR/L Acute Rehabilitation Services [phone]   Ascencion Dike 08/16/2022, 8:09 AM

## 2022-08-16 NOTE — Evaluation (Signed)
Clinical/Bedside Swallow Evaluation Patient Details  Name: Kenneth [name] MRN: [id number] Date of Birth: 1941-07-23  Today's Date: 08/16/2022 Time: SLP Start Time (ACUTE ONLY): 1015 SLP Stop Time (ACUTE ONLY): 0084 SLP Time Calculation (min) (ACUTE ONLY): 27 min  Past Medical History:  Past Medical History:  Diagnosis Date   Acute on chronic respiratory failure with hypoxia ([place]) 09/06/2018   Acute ST elevation myocardial infarction (STEMI) of inferior wall ([place]) 03/01/2020   [facility]   Anxiety    Arthritis    Back (05/04/2018)   Carotid artery disease ([place])    a. 04/2018 s/p R carotid stenting.   Chronic lower back pain    "have it at night" (05/04/2018)   CKD (chronic kidney disease), stage III ([place])    Coronary artery disease    s/p CABG in 2003, patent grafts by cath in 2007, s/p inferior STEMI in 02/2020 with DES to RCA, s/p NSTEMI in 05/2020 with DES to proximal-RCA, s/p NSTEMI in 11/2020 with balloon angioplasty to mid-RCA and STEMI in 08/2021 with DESx3 to RCA   CVA (cerebral vascular accident) ([place]) 10/2017   "little numb on my left face since" (05/04/2018)   Depression    GERD (gastroesophageal reflux disease)    High triglycerides    History of kidney stones    Hyperlipidemia LDL goal <70    Hypertension    Hypertensive crisis 08/08/2022   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   Peripheral vascular disease ([place])    Pneumonia 11/11/2021   Sinus bradycardia    Stroke [facility])    "he's had several little strokes; many that he wasn't aware of" (05/04/2018)   Tobacco abuse    Tracheal stenosis    Past Surgical History:  Past Surgical History:  Procedure Laterality Date   CAROTID STENT Right 06-13-12; 05/04/2018   CAROTID STENT INSERTION N/A 06/13/2012   Procedure: CAROTID STENT INSERTION;  Surgeon: Serafina Mitchell, MD;  Location: [place] CATH LAB;  Service: Cardiovascular;  Laterality: N/A;   CATARACT EXTRACTION, BILATERAL Bilateral    COLONOSCOPY   11/10/2011   Dr. [name]: hemorrhoids, tubular adenoma   CORNEAL TRANSPLANT Bilateral    CORONARY ARTERY BYPASS GRAFT  ~ 2003   "CABG X3"   CORONARY BALLOON ANGIOPLASTY N/A 11/26/2020   Procedure: CORONARY BALLOON ANGIOPLASTY;  Surgeon: Troy Sine, MD;  Location: [place] CV LAB;  Service: Cardiovascular;  Laterality: N/A;   CORONARY STENT INTERVENTION N/A 06/05/2020   Procedure: CORONARY STENT INTERVENTION;  Surgeon: Lorretta Harp, MD;  Location: [place] CV LAB;  Service: Cardiovascular;  Laterality: N/A;   EYE SURGERY     INSERTION OF RETROGRADE CAROTID STENT Right 05/04/2018   Procedure: INSERTION OF RIGHT  CAROTID STENT using an ABBOT- XACT carotid stent system;  Surgeon: Serafina Mitchell, MD;  Location: [place];  Service: Vascular;  Laterality: Right;   LAPAROSCOPIC CHOLECYSTECTOMY  2007   LEFT HEART CATH AND CORS/GRAFTS ANGIOGRAPHY N/A 10/07/2016   Procedure: Left Heart Cath and Cors/Grafts Angiography;  Surgeon: Troy Sine, MD;  Location: [place] CV LAB;  Service: Cardiovascular;  Laterality: N/A;   LEFT HEART CATH AND CORS/GRAFTS ANGIOGRAPHY N/A 06/05/2020   Procedure: LEFT HEART CATH AND CORS/GRAFTS ANGIOGRAPHY;  Surgeon: Lorretta Harp, MD;  Location: [place] CV LAB;  Service: Cardiovascular;  Laterality: N/A;   LEFT HEART CATH AND CORS/GRAFTS ANGIOGRAPHY N/A 11/26/2020   Procedure: LEFT HEART CATH AND CORS/GRAFTS ANGIOGRAPHY;  Surgeon: Troy Sine, MD;  Location: [place]  CV LAB;  Service: Cardiovascular;  Laterality: N/A;   PARTIAL LARYNGECTOMY  1998   TRACHEOSTOMY  03/13/2018   "@ [facility]"   HPI:  Kenneth [name] is a 82 y.o. 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     Patient initially noticed numbness in his left fifth digit  yesterday for a few minutes.  This recurred today and was followed by right hand weakness.  He states this lasted for several hours today. Neurology was consulted at [facility] where patient initially presented via teleneurology.  Due to patient's known severe/critical carotid artery disease he was recommended to be transferred to [facility] for further evaluation and treatment as well as vascular surgery evaluation.MRI head 08/16/82 indicated  Numerous foci of acute infarction in the left cerebral hemisphere  at the frontoparietal vertex consistent with watershed distribution  infarctions. No large confluent infarction. No evidence of  hemorrhage.  2. Old left occipital cortical and subcortical infarction.  BSE generated to assess swallow function in setting of chronic trach/new onset CVA being r/o.    Assessment / Plan / Recommendation  Clinical Impression  Pt seen for clinical swallowing evaluation with a normal oropharyngeal swallow observed in setting of chronic tracheostomy.  Pt denies dysphagia and OME unremarkable.  Pt with slightly lower vocal quality with occluded trach and strong volitional cough.  No overt s/sx of aspiration observed and pt at baseline takes medications with liquids and consumes pt preferred solids/regular consistency.  Recommend initiating a regular/thin liquid diet with general swallowing precautions and reflux precautions d/t chart review revealing MBS completion in 2020 with retrograde noted in distal esophagus with gravity beneficial to prevent post prandial aspiration.  Pt/family in agreement.  ST will f/u briefly in acute setting for dysphagia/cog/linguistic changes.  Thank you for this consult. SLP Visit Diagnosis: Dysphagia, unspecified (R13.10)    Aspiration Risk  Mild aspiration risk    Diet Recommendation   Regular/thin liquids (heart healthy)  Medication Administration: Whole meds with liquid    Other  Recommendations Oral Care Recommendations: Oral care BID;Patient  independent with oral care    Recommendations for follow up therapy are one component of a multi-disciplinary discharge planning process, led by the attending physician.  Recommendations may be updated based on patient status, additional functional criteria and insurance authorization.  Follow up Recommendations No SLP follow up      Assistance Recommended at Discharge    Functional Status Assessment Patient has had a recent decline in their functional status and demonstrates the ability to make significant improvements in function in a reasonable and predictable amount of time.  Frequency and Duration min 1 x/week  1 week       Prognosis Prognosis for Safe Diet Advancement: Good      Swallow Study   General Date of Onset: 08/15/22 HPI: DALEN HENNESSEE is a 82 y.o. 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     Patient initially noticed numbness in his left fifth digit yesterday for a few minutes.  This recurred today and was followed by right hand weakness.  He states this lasted for several hours today. Neurology was consulted at [facility] where patient initially  presented via teleneurology.  Due to patient's known severe/critical carotid artery disease he was recommended to be transferred to [facility] for further evaluation and treatment as well as vascular surgery evaluation.MRI head 08/16/82 indicated  Numerous foci of acute infarction in the left cerebral hemisphere  at the frontoparietal vertex consistent with watershed distribution  infarctions. No large confluent infarction. No evidence of  hemorrhage.  2. Old left occipital cortical and subcortical infarction.  BSE generated to assess swallow function in setting of chronic trach/new onset CVA being r/o. Type of Study:  Bedside Swallow Evaluation Previous Swallow Assessment: MBS 09/27/2018 with Regular/thin liquids recommended with gravity beneficial d/t potential retrograde noted in distal esophagus with c/o "liquids returning at times." No aspiration noted Diet Prior to this Study: NPO Temperature Spikes Noted: No Respiratory Status: Trach Collar;Other (comment) (chronic) History of Recent Intubation: No Behavior/Cognition: Alert;Cooperative;Requires cueing Oral Cavity Assessment: Within Functional Limits Oral Care Completed by SLP: Other (Comment) (pt independent with oral care) Oral Cavity - Dentition: Missing dentition;Dentures, top Vision: Functional for self-feeding Self-Feeding Abilities: Able to feed self;Needs assist Patient Positioning: Upright in bed Baseline Vocal Quality: Breathy;Other (comment) (occlusion with trach improved vocal quality) Volitional Cough: Strong Volitional Swallow: Able to elicit    Oral/Motor/Sensory Function Overall Oral Motor/Sensory Function: Within functional limits   Ice Chips Ice chips: Not tested   Thin Liquid Thin Liquid: Within functional limits Presentation: Cup    Nectar Thick Nectar Thick Liquid: Not tested   Honey Thick Honey Thick Liquid: Not tested   Puree Puree: Within functional limits Presentation: Self Fed   Solid     Solid: Within functional limits Presentation: Self Fed      Pat Takenya Travaglini,M.S., CCC-SLP 08/16/2022,10:59 AM

## 2022-08-16 NOTE — Progress Notes (Signed)
STROKE TEAM PROGRESS NOTE   SUBJECTIVE (INTERVAL HISTORY) His son and wife are at the bedside.  Overall his condition is stable. 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 VVS on board, plan for TCAR tomorrow or transfemoral stenting on Wednesday. Continue IV heparin.    OBJECTIVE Temp:  [97 F (36.1 C)-98.9 F (37.2 C)] 98.7 F (37.1 C) (01/22 1217) Pulse Rate:  [65-147] 99 (01/22 1217) Cardiac Rhythm: Atrial fibrillation;Bundle branch block (01/22 0707) Resp:  [17-36] 20 (01/22 1217) BP: (88-148)/(64-100) 127/74 (01/22 1217) SpO2:  [86 %-100 %] 95 % (01/22 1217) FiO2 (%):  [21 %-28 %] 21 % (01/22 1120) Weight:  [80.6 kg] 80.6 kg (01/21 1500)  Recent Labs  Lab 08/15/22 1250  GLUCAP 139*   Recent Labs  Lab 08/15/22 1304 08/15/22 1312 08/16/22 0354  NA 134* 137 136  K 4.3 4.5 3.9  CL 101 102 106  CO2 24  --  22  GLUCOSE 125* 127* 100*  BUN '22 22 17  '$ CREATININE 1.60* 1.60* 1.42*  CALCIUM 8.7*  --  8.4*   Recent Labs  Lab 08/15/22 1304 08/16/22 0354  AST 23 18  ALT 22 18  ALKPHOS 103 89  BILITOT 0.4 0.3  PROT 7.2 6.2*  ALBUMIN 3.6 3.1*   Recent Labs  Lab 08/11/22 0421 08/15/22 1304 08/15/22 1312 08/16/22 0354  WBC 9.6 9.1  --  9.9  NEUTROABS 6.5 6.3  --   --   HGB 12.2* 12.2* 13.3 11.7*  HCT 38.0* 38.6* 39.0 35.3*  MCV 90.3 91.7  --  89.4  PLT 232 323  --  272   No results for input(s): "CKTOTAL", "CKMB", "CKMBINDEX", "TROPONINI" in the last 168 hours. Recent Labs    08/15/22 1304  LABPROT 14.8  INR 1.2   Recent Labs    08/15/22 1658  COLORURINE STRAW*  LABSPEC 1.020  PHURINE 6.0  GLUCOSEU NEGATIVE  HGBUR SMALL*  BILIRUBINUR NEGATIVE  KETONESUR NEGATIVE  PROTEINUR NEGATIVE  NITRITE NEGATIVE  LEUKOCYTESUR TRACE*       Component Value Date/Time   CHOL 102 08/16/2022 0354   CHOL 110 09/26/2020 0822   TRIG 208 (H) 08/16/2022 0354   HDL 28 (L) 08/16/2022 0354   HDL 34 (L) 09/26/2020 0822    CHOLHDL 3.6 08/16/2022 0354   VLDL 42 (H) 08/16/2022 0354   LDLCALC 32 08/16/2022 0354   LDLCALC 44 09/26/2020 0822   Lab Results  Component Value Date   HGBA1C 6.1 (H) 12/19/2020      Component Value Date/Time   LABOPIA NONE DETECTED 08/15/2022 1658   COCAINSCRNUR NONE DETECTED 08/15/2022 1658   LABBENZ NONE DETECTED 08/15/2022 1658   AMPHETMU NONE DETECTED 08/15/2022 1658   THCU NONE DETECTED 08/15/2022 1658   LABBARB NONE DETECTED 08/15/2022 1658    Recent Labs  Lab 08/15/22 1304  ETH <10    I have personally reviewed the radiological images below and agree with the radiology interpretations.  MR BRAIN WO CONTRAST  Result Date: 08/16/2022 CLINICAL DATA:  Neuro deficit, acute, stroke suspected. EXAM: MRI HEAD WITHOUT CONTRAST TECHNIQUE: Multiplanar, multiecho pulse sequences of the brain and surrounding structures were obtained without intravenous contrast. COMPARISON:  CT studies done yesterday FINDINGS: Brain: Diffusion imaging does not show any acute or subacute infarction affecting the brainstem, cerebellum or right cerebral hemisphere. Within the left cerebral hemisphere, there are numerous discrete foci of acute infarction affecting the cortical and subcortical brain extending from front  to back at the frontoparietal vertex consistent with watershed distribution infarctions. No large confluent infarction. Regions of infarction show only minimal swelling. No evidence of hemorrhage. Otherwise, there chronic small-vessel ischemic changes of the pons and throughout the cerebral hemispheric white matter. There is an old left occipital cortical and subcortical infarction. No mass, hydrocephalus or extra-axial collection Vascular: Major vessels at the base of the brain show flow. Skull and upper cervical spine: Negative Sinuses/Orbits: Clear/normal Other: None IMPRESSION: 1. Numerous foci of acute infarction in the left cerebral hemisphere at the frontoparietal vertex consistent with  watershed distribution infarctions. No large confluent infarction. No evidence of hemorrhage. 2. Old left occipital cortical and subcortical infarction. 3. Extensive chronic small-vessel ischemic changes elsewhere affecting the pons and cerebral hemispheric white matter. Electronically Signed   By: Nelson Chimes M.D.   On: 08/16/2022 09:57   CT ANGIO HEAD NECK W WO CM W PERF (CODE STROKE)  Result Date: 08/15/2022 CLINICAL DATA:  Neuro deficit, acute, stroke suspected EXAM: CT ANGIOGRAPHY HEAD AND NECK CT PERFUSION BRAIN TECHNIQUE: Multidetector CT imaging of the head and neck was performed using the standard protocol during bolus administration of intravenous contrast. Multiplanar CT image reconstructions and MIPs were obtained to evaluate the vascular anatomy. Carotid stenosis measurements (when applicable) are obtained utilizing NASCET criteria, using the distal internal carotid diameter as the denominator. Multiphase CT imaging of the brain was performed following IV bolus contrast injection. Subsequent parametric perfusion maps were calculated using RAPID software. RADIATION DOSE REDUCTION: This exam was performed according to the departmental dose-optimization program which includes automated exposure control, adjustment of the mA and/or kV according to patient size and/or use of iterative reconstruction technique. CONTRAST:  122m OMNIPAQUE IOHEXOL 350 MG/ML SOLN COMPARISON:  None Available. Same day CT head.  CTA head/neck January 20, 2021. FINDINGS: CTA NECK FINDINGS Aortic arch: Great vessel origins are patent. Right carotid system: Stenting of the common carotid artery and the proximal internal carotid artery. The stent is patent. There is moderate (approximately 60-70% stenosis in the common carotid stent). Left carotid system: Common carotid artery is patent. Critical stenosis of the left proximal ICA in the neck with trace (approximately 1 mm diameter) luminal opacification. Findings are mildly  progressed from the prior. There is opacification of the more distal ICA in the neck. Vertebral arteries: Patent bilaterally. No evidence of hemodynamically significant stenosis in the neck. Skeleton: Multilevel degenerative change. No acute findings on limited assessment. Other neck: No acute findings on limited assessment. A dedicated CT neck could better assess for malignancy. Upper chest: Visualized lung apices are largely clear with evidence of expiratory changes. Review of the MIP images confirms the above findings CTA HEAD FINDINGS Anterior circulation: Bilateral intracranial ICAs are patent. Moderate stenosis of the right cavernous and paraclinoid ICA. Bilateral MCAs and ACAs are patent. Posterior circulation: Chronically severely stenotic versus occluded right intradural vertebral artery. Left intradural vertebral artery is patent with moderate stenosis. Basilar artery and bilateral posterior cerebral arteries are patent. Mild right and moderate left P2 PCA stenosis. Venous sinuses: As permitted by contrast timing, patent. Review of the MIP images confirms the above findings CT Brain Perfusion Findings: ASPECTS: 10 CBF (<30%) Volume: 059mPerfusion (Tmax>6.0s) volume: 12753mismatch Volume: 127m58mfarction Location:None identified. IMPRESSION: 1. Critical stenosis of the left proximal ICA in the neck with trace (approximately 1 mm diameter) luminal opacification. Findings are mildly progressed from the prior. 2. On CT perfusion, approximately 127 mL of the reported penumbra in the left cerebrum  which likely relates to the above. No core infarct identified. 3. Right common carotid artery and internal carotid artery stent. Moderate to severe stenosis of the common carotid artery stent with approximately 60-70% stenosis. A catheter arteriogram could more accurately quantify/characterize if clinically warranted. 4. 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 5. Moderate right  intracranial ICA stenosis. 6. Moderate left and mild right P2 PCA stenosis. Findings discussed with Dr. Erlinda Hong via telephone at 2:35 p.m. Electronically Signed   By: Margaretha Sheffield M.D.   On: 08/15/2022 14:40   CT HEAD CODE STROKE WO CONTRAST  Result Date: 08/15/2022 CLINICAL DATA:  Code stroke.  Neuro deficit, acute, stroke suspected EXAM: CT HEAD WITHOUT CONTRAST TECHNIQUE: Contiguous axial images were obtained from the base of the skull through the vertex without intravenous contrast. RADIATION DOSE REDUCTION: This exam was performed according to the departmental dose-optimization program which includes automated exposure control, adjustment of the mA and/or kV according to patient size and/or use of iterative reconstruction technique. COMPARISON:  January 20, 2021. FINDINGS: Brain: No evidence of acute large vascular territory infarction, hemorrhage, hydrocephalus, extra-axial collection or mass lesion/mass effect. Similar patchy areas of periventricular and subcortical white matter hypodensity, nonspecific but compatible with chronic microvascular ischemic disease. Vascular: No hyperdense vessel identified. Skull: No acute fracture Sinuses/Orbits: Clear sinuses.  No acute orbital findings. Other: No mastoid effusions. ASPECTS [facility] Stroke Program Early CT Score) total score (0-10 with 10 being normal): 10. IMPRESSION: 1. No evidence of acute intracranial abnormality.  ASPECTS is 10. 2. Similar chronic microvascular ischemic disease. Code stroke imaging results were communicated on 08/15/2022 at 1:18 pm to provider Dr. Roderic Palau via telephone, who verbally acknowledged these results. Electronically Signed   By: Margaretha Sheffield M.D.   On: 08/15/2022 13:19   ECHOCARDIOGRAM COMPLETE  Result Date: 08/09/2022    ECHOCARDIOGRAM REPORT   Patient Name:   Kenneth [name] Date of Exam: 08/09/2022 Medical Rec #:  [id number]        Height:       65.0 in Accession #:    [id number]       Weight:       182.1 lb Date of  Birth:  [birth year]        BSA:          1.901 m Patient Age:    29 years         BP:           122/80 mmHg Patient Gender: M                HR:           81 bpm. Exam Location:  [facility] Procedure: 2D Echo, Cardiac Doppler, Color Doppler and Intracardiac            Opacification Agent Indications:    Atrial Fibrillation I48.91  History:        Patient has prior history of Echocardiogram examinations, most                 recent 12/18/2020. CAD and Previous Myocardial Infarction, Prior                 CABG, Stroke, Signs/Symptoms:Chest Pain; Risk                 Factors:Dyslipidemia, Hypertension and Current Smoker.  Sonographer:    Greer Pickerel Referring Phys: [id number] [place] [facility]  Sonographer Comments: Image acquisition challenging due to respiratory motion. IMPRESSIONS  1.  Left ventricular ejection fraction, by estimation, is 55 to 60%. The left ventricle has normal function. The left ventricle has no regional wall motion abnormalities. Left ventricular diastolic function could not be evaluated.  2. Right ventricular systolic function is mildly reduced. The right ventricular size is mildly enlarged.  3. The mitral valve is grossly normal. No evidence of mitral valve regurgitation. No evidence of mitral stenosis.  4. The aortic valve was not well visualized. There is mild calcification of the aortic valve. Aortic valve regurgitation is not visualized. No aortic stenosis is present.  5. Aortic dilatation noted. There is borderline dilatation of the aortic root, measuring 38 mm. Comparison(s): Changes from prior study are noted. LVEF improved from 45-50% in 08/2021 tio 55-60% now. FINDINGS  Left Ventricle: Left ventricular ejection fraction, by estimation, is 55 to 60%. The left ventricle has normal function. The left ventricle has no regional wall motion abnormalities. Definity contrast agent was given IV to delineate the left ventricular  endocardial borders. The left ventricular internal cavity size was normal  in size. There is no left ventricular hypertrophy. Left ventricular diastolic function could not be evaluated due to atrial fibrillation. Left ventricular diastolic function could  not be evaluated. Right Ventricle: The right ventricular size is mildly enlarged. No increase in right ventricular wall thickness. Right ventricular systolic function is mildly reduced. Left Atrium: Left atrial size was normal in size. Right Atrium: Right atrial size was normal in size. Pericardium: There is no evidence of pericardial effusion. Mitral Valve: The mitral valve is grossly normal. No evidence of mitral valve regurgitation. No evidence of mitral valve stenosis. Tricuspid Valve: The tricuspid valve is grossly normal. Tricuspid valve regurgitation is not demonstrated. No evidence of tricuspid stenosis. Aortic Valve: The aortic valve was not well visualized. There is mild calcification of the aortic valve. There is mild aortic valve annular calcification. Aortic valve regurgitation is not visualized. No aortic stenosis is present. Pulmonic Valve: The pulmonic valve was not well visualized. Pulmonic valve regurgitation is not visualized. No evidence of pulmonic stenosis. Aorta: Aortic dilatation noted. There is borderline dilatation of the aortic root, measuring 38 mm. Venous: The inferior vena cava was not well visualized. IAS/Shunts: The interatrial septum was not well visualized.  LEFT VENTRICLE PLAX 2D LVIDd:         4.50 cm   Diastology LVIDs:         3.30 cm   LV e' medial:    6.68 cm/s LV PW:         1.10 cm   LV E/e' medial:  13.4 LV IVS:        0.90 cm   LV e' lateral:   13.40 cm/s LVOT diam:     2.00 cm   LV E/e' lateral: 6.7 LV SV:         52 LV SV Index:   27 LVOT Area:     3.14 cm  RIGHT VENTRICLE RV S prime:     10.10 cm/s TAPSE (M-mode): 1.2 cm LEFT ATRIUM         Index       RIGHT ATRIUM           Index LA diam:    4.10 cm 2.16 cm/m  RA Area:     17.70 cm                                 RA Volume:  46.20 ml   24.30 ml/m  AORTIC VALVE LVOT Vmax:   114.00 cm/s LVOT Vmean:  74.100 cm/s LVOT VTI:    0.165 m  AORTA Ao Root diam: 3.80 cm MITRAL VALVE MV Area (PHT): 4.77 cm    SHUNTS MV Decel Time: 159 msec    Systemic VTI:  0.16 m MV E velocity: 89.50 cm/s  Systemic Diam: 2.00 cm Vishnu Priya Mallipeddi Electronically signed by Lorelee Cover Mallipeddi Signature Date/Time: 08/09/2022/4:22:54 PM    Final    DG Chest Portable 1 View  Result Date: 08/07/2022 CLINICAL DATA:  Chest pain EXAM: PORTABLE CHEST 1 VIEW COMPARISON:  05/30/2022 FINDINGS: Single frontal view of the chest demonstrates tracheostomy tube unchanged. Cardiac silhouette is stable. Postsurgical changes from median sternotomy. No acute airspace disease, effusion, or pneumothorax. Chronic elevation right hemidiaphragm. No acute bony abnormality. IMPRESSION: 1. Stable chest, no acute process. Electronically Signed   By: Randa Ngo M.D.   On: 08/07/2022 22:16     PHYSICAL EXAM  Temp:  [97 F (36.1 C)-98.9 F (37.2 C)] 98.7 F (37.1 C) (01/22 1217) Pulse Rate:  [65-147] 99 (01/22 1217) Resp:  [17-36] 20 (01/22 1217) BP: (88-148)/(64-100) 127/74 (01/22 1217) SpO2:  [86 %-100 %] 95 % (01/22 1217) FiO2 (%):  [21 %-28 %] 21 % (01/22 1120) Weight:  [80.6 kg] 80.6 kg (01/21 1500)  General - Well nourished, well developed, in no apparent distress.  Ophthalmologic - fundi not visualized due to noncooperation.  Cardiovascular - irregularly irregular heart rate and rhythm.  Neuro - awake, alert, eyes open, orientated to place but not to age or time. No aphasia, trach in place, paucity of speech but following all simple commands. Able to name and repeat. No gaze palsy, tracking bilaterally, right lower quadrantanopia. No facial droop. Tongue midline. Bilateral UEs and LEs symmetrical strength. Sensation symmetrical bilaterally, b/l FTN intact but slow, gait not tested.     ASSESSMENT/PLAN Mr. DONYALE BERTHOLD is a 82 y.o. 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 He did have left eye ptosis and left hand numbness yesterday but fully resolved. NIHSS =4. CT no acute finding. Pt not TNK candidate due to on eliquis this am.     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 Findings are progressed from the prior. Right common carotid artery and internal carotid artery stent. Moderate to severe stenosis of the common carotid artery stent with approximately 60-70% stenosis. 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 Moderate right intracranial ICA stenosis. Moderate left and mild right P2 PCA stenosis. CTP - 0/127 MRI - Numerous foci of acute infarction in the left cerebral hemisphere at the frontoparietal vertex consistent with watershed distribution infarctions.  2D Echo  EF 55-60% LDL 32 HgbA1c pending UDS neg Heparin IV for VTE prophylaxis clopidogrel 75 mg daily and Eliquis (apixaban) daily prior to admission, now on clopidogrel 75 mg daily and heparin IV.  Patient counseled to be compliant with his antithrombotic medications Ongoing aggressive stroke risk factor management Therapy recommendations:  pending Disposition:  pending  Bilateral carotid  stenosis Had right ICA stenting in 05/2012 and right CCA stenting in 04/2018. Also high-grade stenosis on the left ICA.  In 12/2020 CT head and neck showed right CCA 70% in-stent stenosis, right ICA 40% in-stent stenosis.  Left ICA 65% stenosis.  Carotid Doppler in 04/2021  showed right CCA stent 50 to 75% stenosis, however left ICA 80 to 99% stenosis.  He has left ICA stenosis so far asymptomatic, no procedure planned and the patient is undergoing surveillance with biannual carotid duplex.  He follows up with Dr. Stanford Breed in clinic.   VVS on board, appreciate help plan for TCAR tomorrow or transfemoral stenting on Wednesday.  Hx of stroke/TIA 11/2020 for episode of dizziness and gait instability.  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  Carotid Doppler showed left ICA 70 to 99% stenosis, however considered asymptomatic at that time.   CAD NSTEMI Afib S/p CABG admitted 1 week ago for chest pain and hypertensive urgency. Treated with nitroglycerin drip. Prior to discharge, he was found to have A-fib RVR, EF 55 to 60%. Discharged on Eliquis. Patient takes Eliquis at home, last dose this morning per wife.  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 After hours, contact General Neurology

## 2022-08-17 ENCOUNTER — Inpatient Hospital Stay (HOSPITAL_COMMUNITY): Payer: Medicare HMO | Admitting: Anesthesiology

## 2022-08-17 ENCOUNTER — Inpatient Hospital Stay (HOSPITAL_COMMUNITY): Payer: Medicare HMO

## 2022-08-17 ENCOUNTER — Other Ambulatory Visit: Payer: Self-pay

## 2022-08-17 ENCOUNTER — Encounter (HOSPITAL_COMMUNITY)
Admission: EM | Disposition: A | Discharge status: Home / Self Care | Payer: Self-pay | Source: Home / Self Care | Attending: Internal Medicine

## 2022-08-17 DIAGNOSIS — I10 Essential (primary) hypertension: Secondary | ICD-10-CM

## 2022-08-17 DIAGNOSIS — I63232 Cerebral infarction due to unspecified occlusion or stenosis of left carotid arteries: Secondary | ICD-10-CM | POA: Diagnosis not present

## 2022-08-17 DIAGNOSIS — I6522 Occlusion and stenosis of left carotid artery: Secondary | ICD-10-CM

## 2022-08-17 DIAGNOSIS — F418 Other specified anxiety disorders: Secondary | ICD-10-CM | POA: Diagnosis not present

## 2022-08-17 DIAGNOSIS — I6523 Occlusion and stenosis of bilateral carotid arteries: Secondary | ICD-10-CM | POA: Diagnosis not present

## 2022-08-17 DIAGNOSIS — I2510 Atherosclerotic heart disease of native coronary artery without angina pectoris: Secondary | ICD-10-CM | POA: Diagnosis not present

## 2022-08-17 DIAGNOSIS — I639 Cerebral infarction, unspecified: Secondary | ICD-10-CM | POA: Diagnosis not present

## 2022-08-17 DIAGNOSIS — Z87891 Personal history of nicotine dependence: Secondary | ICD-10-CM

## 2022-08-17 HISTORY — PX: TRANSCAROTID ARTERY REVASCULARIZATIONÂ: SHX6778

## 2022-08-17 LAB — TYPE AND SCREEN
ABO/RH(D): B POS
Antibody Screen: NEGATIVE

## 2022-08-17 LAB — CBC
HCT: 38 % — ABNORMAL LOW (ref 39.0–52.0)
HCT: 39.8 % (ref 39.0–52.0)
Hemoglobin: 12.4 g/dL — ABNORMAL LOW (ref 13.0–17.0)
Hemoglobin: 12.4 g/dL — ABNORMAL LOW (ref 13.0–17.0)
MCH: 28.9 pg (ref 26.0–34.0)
MCH: 29.2 pg (ref 26.0–34.0)
MCHC: 31.2 g/dL (ref 30.0–36.0)
MCHC: 32.6 g/dL (ref 30.0–36.0)
MCV: 89.4 fL (ref 80.0–100.0)
MCV: 92.8 fL (ref 80.0–100.0)
Platelets: 269 10*3/uL (ref 150–400)
Platelets: 281 10*3/uL (ref 150–400)
RBC: 4.25 MIL/uL (ref 4.22–5.81)
RBC: 4.29 MIL/uL (ref 4.22–5.81)
RDW: 15.9 % — ABNORMAL HIGH (ref 11.5–15.5)
RDW: 15.9 % — ABNORMAL HIGH (ref 11.5–15.5)
WBC: 10 10*3/uL (ref 4.0–10.5)
WBC: 10.5 10*3/uL (ref 4.0–10.5)
nRBC: 0 % (ref 0.0–0.2)
nRBC: 0 % (ref 0.0–0.2)

## 2022-08-17 LAB — CREATININE, SERUM
Creatinine, Ser: 1.51 mg/dL — ABNORMAL HIGH (ref 0.61–1.24)
GFR, Estimated: 46 mL/min — ABNORMAL LOW (ref >60)

## 2022-08-17 LAB — APTT
aPTT: 62 seconds — ABNORMAL HIGH (ref 24–36)
aPTT: 29 seconds (ref 24–36)

## 2022-08-17 LAB — HEMOGLOBIN A1C
Hgb A1c MFr Bld: 6.4 % — ABNORMAL HIGH (ref 4.8–5.6)
Mean Plasma Glucose: 136.98 mg/dL

## 2022-08-17 LAB — BASIC METABOLIC PANEL
Anion gap: 9 (ref 5–15)
BUN: 16 mg/dL (ref 8–23)
CO2: 23 mmol/L (ref 22–32)
Calcium: 8.6 mg/dL — ABNORMAL LOW (ref 8.9–10.3)
Chloride: 105 mmol/L (ref 98–111)
Creatinine, Ser: 1.62 mg/dL — ABNORMAL HIGH (ref 0.61–1.24)
GFR, Estimated: 42 mL/min — ABNORMAL LOW (ref >60)
Glucose, Bld: 113 mg/dL — ABNORMAL HIGH (ref 70–99)
Potassium: 4 mmol/L (ref 3.5–5.1)
Sodium: 137 mmol/L (ref 135–145)

## 2022-08-17 LAB — HEPARIN LEVEL (UNFRACTIONATED)
Heparin Unfractionated: 0.68 IU/mL (ref 0.30–0.70)
Heparin Unfractionated: 0.16 IU/mL — ABNORMAL LOW (ref 0.30–0.70)

## 2022-08-17 LAB — POCT ACTIVATED CLOTTING TIME
Activated Clotting Time: 244 seconds
Activated Clotting Time: 271 seconds

## 2022-08-17 LAB — SURGICAL PCR SCREEN
MRSA, PCR: NEGATIVE
Staphylococcus aureus: NEGATIVE

## 2022-08-17 SURGERY — TRANSCAROTID ARTERY REVASCULARIZATION (TCAR)
Anesthesia: General | Site: Neck | Laterality: Left

## 2022-08-17 MED ORDER — LABETALOL HCL 5 MG/ML IV SOLN: 10.0000 mg | INTRAVENOUS | Status: DC | PRN

## 2022-08-17 MED ORDER — FLEET ENEMA 7-19 GM/118ML RE ENEM: 1.0000 | ENEMA | Freq: Once | RECTAL | Status: DC | PRN

## 2022-08-17 MED ORDER — ACETAMINOPHEN 650 MG RE SUPP: 325.0000 mg | RECTAL | Status: DC | PRN

## 2022-08-17 MED ORDER — METOPROLOL TARTRATE 5 MG/5ML IV SOLN: 2.0000 mg | INTRAVENOUS | Status: DC | PRN

## 2022-08-17 MED ORDER — HEPARIN 6000 UNIT IRRIGATION SOLUTION
Status: AC
  Filled 2022-08-17: qty 500

## 2022-08-17 MED ORDER — 0.9 % SODIUM CHLORIDE (POUR BTL) OPTIME
TOPICAL | Status: DC | PRN
  Administered 2022-08-17: 2000 mL

## 2022-08-17 MED ORDER — ALUM & MAG HYDROXIDE-SIMETH 200-200-20 MG/5ML PO SUSP: 15.0000 mL | ORAL | Status: DC | PRN

## 2022-08-17 MED ORDER — LABETALOL HCL 5 MG/ML IV SOLN
INTRAVENOUS | Status: DC | PRN
  Administered 2022-08-17: 5 mg via INTRAVENOUS

## 2022-08-17 MED ORDER — ASPIRIN 81 MG PO CHEW
CHEWABLE_TABLET | ORAL | Status: AC
  Administered 2022-08-17: 81 mg
  Filled 2022-08-17: qty 1

## 2022-08-17 MED ORDER — SODIUM CHLORIDE 0.9 % IV SOLN: 500.0000 mL | Freq: Once | INTRAVENOUS | Status: DC | PRN

## 2022-08-17 MED ORDER — SODIUM CHLORIDE 0.9 % IV SOLN: INTRAVENOUS | Status: DC

## 2022-08-17 MED ORDER — LACTATED RINGERS IV SOLN: INTRAVENOUS | Status: DC | PRN

## 2022-08-17 MED ORDER — ASPIRIN 81 MG PO TBEC: 81.0000 mg | DELAYED_RELEASE_TABLET | Freq: Every day | ORAL | Status: DC

## 2022-08-17 MED ORDER — METOPROLOL TARTRATE 5 MG/5ML IV SOLN
INTRAVENOUS | Status: AC
  Filled 2022-08-17: qty 5

## 2022-08-17 MED ORDER — PROPOFOL 10 MG/ML IV BOLUS
INTRAVENOUS | Status: AC
  Filled 2022-08-17: qty 20

## 2022-08-17 MED ORDER — IODIXANOL 320 MG/ML IV SOLN
INTRAVENOUS | Status: DC | PRN
  Administered 2022-08-17: 30 mL via INTRA_ARTERIAL

## 2022-08-17 MED ORDER — POTASSIUM CHLORIDE CRYS ER 20 MEQ PO TBCR: 20.0000 meq | EXTENDED_RELEASE_TABLET | Freq: Every day | ORAL | Status: DC | PRN

## 2022-08-17 MED ORDER — CLOPIDOGREL BISULFATE 75 MG PO TABS: 75.0000 mg | ORAL_TABLET | Freq: Every day | ORAL | Status: DC

## 2022-08-17 MED ORDER — PHENOL 1.4 % MT LIQD: 1.0000 | OROMUCOSAL | Status: DC | PRN

## 2022-08-17 MED ORDER — LIDOCAINE 2% (20 MG/ML) 5 ML SYRINGE
INTRAMUSCULAR | Status: DC | PRN
  Administered 2022-08-17: 60 mg via INTRAVENOUS

## 2022-08-17 MED ORDER — OXYCODONE HCL 5 MG PO TABS: 5.0000 mg | ORAL_TABLET | ORAL | Status: DC | PRN

## 2022-08-17 MED ORDER — METOPROLOL TARTRATE 25 MG PO TABS
25.0000 mg | ORAL_TABLET | Freq: Two times a day (BID) | ORAL | Status: DC
  Administered 2022-08-17 – 2022-08-19 (×5): 25 mg via ORAL
  Filled 2022-08-17 (×5): qty 1

## 2022-08-17 MED ORDER — HYDROMORPHONE HCL 1 MG/ML IJ SOLN: 0.5000 mg | INTRAMUSCULAR | Status: DC | PRN

## 2022-08-17 MED ORDER — PROPOFOL 10 MG/ML IV BOLUS
INTRAVENOUS | Status: DC | PRN
  Administered 2022-08-17: 100 mg via INTRAVENOUS

## 2022-08-17 MED ORDER — ACETAMINOPHEN 325 MG PO TABS: 325.0000 mg | ORAL_TABLET | ORAL | Status: DC | PRN

## 2022-08-17 MED ORDER — PHENYLEPHRINE 80 MCG/ML (10ML) SYRINGE FOR IV PUSH (FOR BLOOD PRESSURE SUPPORT)
PREFILLED_SYRINGE | INTRAVENOUS | Status: DC | PRN
  Administered 2022-08-17 (×3): 160 ug via INTRAVENOUS
  Administered 2022-08-17 (×2): 80 ug via INTRAVENOUS
  Administered 2022-08-17: 160 ug via INTRAVENOUS

## 2022-08-17 MED ORDER — SENNOSIDES-DOCUSATE SODIUM 8.6-50 MG PO TABS: 1.0000 | ORAL_TABLET | Freq: Every evening | ORAL | Status: DC | PRN

## 2022-08-17 MED ORDER — HYDRALAZINE HCL 20 MG/ML IJ SOLN: 5.0000 mg | INTRAMUSCULAR | Status: DC | PRN

## 2022-08-17 MED ORDER — PROTAMINE SULFATE 10 MG/ML IV SOLN
INTRAVENOUS | Status: DC | PRN
  Administered 2022-08-17 (×2): 10 mg via INTRAVENOUS
  Administered 2022-08-17: 30 mg via INTRAVENOUS

## 2022-08-17 MED ORDER — GUAIFENESIN-DM 100-10 MG/5ML PO SYRP: 15.0000 mL | ORAL_SOLUTION | ORAL | Status: DC | PRN

## 2022-08-17 MED ORDER — PANTOPRAZOLE SODIUM 40 MG PO TBEC: 40.0000 mg | DELAYED_RELEASE_TABLET | Freq: Every day | ORAL | Status: DC

## 2022-08-17 MED ORDER — DEXAMETHASONE SODIUM PHOSPHATE 10 MG/ML IJ SOLN
INTRAMUSCULAR | Status: DC | PRN
  Administered 2022-08-17: 10 mg via INTRAVENOUS

## 2022-08-17 MED ORDER — HEMOSTATIC AGENTS (NO CHARGE) OPTIME
TOPICAL | Status: DC | PRN
  Administered 2022-08-17 (×2): 1 via TOPICAL

## 2022-08-17 MED ORDER — PHENYLEPHRINE HCL-NACL 20-0.9 MG/250ML-% IV SOLN
INTRAVENOUS | Status: DC | PRN
  Administered 2022-08-17: 75 ug/min via INTRAVENOUS

## 2022-08-17 MED ORDER — FENTANYL CITRATE (PF) 100 MCG/2ML IJ SOLN: 25.0000 ug | INTRAMUSCULAR | Status: DC | PRN

## 2022-08-17 MED ORDER — FENTANYL CITRATE (PF) 250 MCG/5ML IJ SOLN
INTRAMUSCULAR | Status: AC
  Filled 2022-08-17: qty 5

## 2022-08-17 MED ORDER — DOCUSATE SODIUM 100 MG PO CAPS
100.0000 mg | ORAL_CAPSULE | Freq: Every day | ORAL | Status: DC
  Administered 2022-08-18 – 2022-08-19 (×2): 100 mg via ORAL
  Filled 2022-08-17 (×2): qty 1

## 2022-08-17 MED ORDER — NITROGLYCERIN 1 MG/10 ML FOR IR/CATH LAB
INTRA_ARTERIAL | Status: DC | PRN
  Administered 2022-08-17: 400 ug via INTRA_ARTERIAL

## 2022-08-17 MED ORDER — HEPARIN SODIUM (PORCINE) 1000 UNIT/ML IJ SOLN
INTRAMUSCULAR | Status: AC
  Filled 2022-08-17: qty 10

## 2022-08-17 MED ORDER — MAGNESIUM SULFATE 2 GM/50ML IV SOLN: 2.0000 g | Freq: Every day | INTRAVENOUS | Status: DC | PRN

## 2022-08-17 MED ORDER — CLINDAMYCIN PHOSPHATE 900 MG/50ML IV SOLN
INTRAVENOUS | Status: AC
  Filled 2022-08-17: qty 50

## 2022-08-17 MED ORDER — ONDANSETRON HCL 4 MG/2ML IJ SOLN: 4.0000 mg | Freq: Four times a day (QID) | INTRAMUSCULAR | Status: DC | PRN

## 2022-08-17 MED ORDER — HEPARIN 6000 UNIT IRRIGATION SOLUTION
Status: DC | PRN
  Administered 2022-08-17: 1

## 2022-08-17 MED ORDER — NITROGLYCERIN IN D5W 200-5 MCG/ML-% IV SOLN
INTRAVENOUS | Status: AC
  Filled 2022-08-17: qty 250

## 2022-08-17 MED ORDER — CLOPIDOGREL BISULFATE 75 MG PO TABS
75.0000 mg | ORAL_TABLET | Freq: Once | ORAL | Status: AC
  Administered 2022-08-17: 75 mg via ORAL

## 2022-08-17 MED ORDER — ASPIRIN 81 MG PO CHEW: 81.0000 mg | CHEWABLE_TABLET | Freq: Once | ORAL | Status: DC

## 2022-08-17 MED ORDER — HEPARIN SODIUM (PORCINE) 5000 UNIT/ML IJ SOLN
5000.0000 [IU] | Freq: Three times a day (TID) | INTRAMUSCULAR | Status: DC
  Administered 2022-08-17 – 2022-08-18 (×3): 5000 [IU] via SUBCUTANEOUS
  Filled 2022-08-17 (×3): qty 1

## 2022-08-17 MED ORDER — SUGAMMADEX SODIUM 200 MG/2ML IV SOLN
INTRAVENOUS | Status: DC | PRN
  Administered 2022-08-17: 200 mg via INTRAVENOUS

## 2022-08-17 MED ORDER — HEPARIN SODIUM (PORCINE) 1000 UNIT/ML IJ SOLN
INTRAMUSCULAR | Status: DC | PRN
  Administered 2022-08-17: 8000 [IU] via INTRAVENOUS
  Administered 2022-08-17: 2000 [IU] via INTRAVENOUS

## 2022-08-17 MED ORDER — BISACODYL 5 MG PO TBEC: 5.0000 mg | DELAYED_RELEASE_TABLET | Freq: Every day | ORAL | Status: DC | PRN

## 2022-08-17 MED ORDER — FENTANYL CITRATE (PF) 250 MCG/5ML IJ SOLN
INTRAMUSCULAR | Status: DC | PRN
  Administered 2022-08-17 (×3): 50 ug via INTRAVENOUS

## 2022-08-17 MED ORDER — CLINDAMYCIN PHOSPHATE 900 MG/50ML IV SOLN
INTRAVENOUS | Status: DC | PRN
  Administered 2022-08-17: 900 mg via INTRAVENOUS

## 2022-08-17 MED ORDER — METOPROLOL TARTRATE 5 MG/5ML IV SOLN
5.0000 mg | Freq: Once | INTRAVENOUS | Status: AC
  Administered 2022-08-17: 5 mg via INTRAVENOUS

## 2022-08-17 MED ORDER — ONDANSETRON HCL 4 MG/2ML IJ SOLN
INTRAMUSCULAR | Status: DC | PRN
  Administered 2022-08-17: 4 mg via INTRAVENOUS

## 2022-08-17 MED ORDER — ROCURONIUM BROMIDE 10 MG/ML (PF) SYRINGE
PREFILLED_SYRINGE | INTRAVENOUS | Status: DC | PRN
  Administered 2022-08-17: 50 mg via INTRAVENOUS
  Administered 2022-08-17: 20 mg via INTRAVENOUS

## 2022-08-17 SURGICAL SUPPLY — 68 items
ADH SKN CLS APL DERMABOND .7 (GAUZE/BANDAGES/DRESSINGS) ×1
ADH SKN CLS LQ APL DERMABOND (GAUZE/BANDAGES/DRESSINGS) ×1
BAG BANDED W/RUBBER/TAPE 36X54 (MISCELLANEOUS) ×1 IMPLANT
BAG COUNTER SPONGE SURGICOUNT (BAG) ×1 IMPLANT
BAG EQP BAND 135X91 W/RBR TAPE (MISCELLANEOUS) ×1
BAG SPNG CNTER NS LX DISP (BAG) ×1
BALLN STERLING RX 5X30X80 (BALLOONS) ×1
BALLN STERLING RX 6X30X80 (BALLOONS) ×1
BALLOON STERLING RX 5X30X80 (BALLOONS) IMPLANT
BALLOON STERLING RX 6X30X80 (BALLOONS) IMPLANT
CANISTER SUCT 3000ML PPV (MISCELLANEOUS) ×1 IMPLANT
CATH ANGIO BERNSTEIN 5X40X.035 (CATHETERS) IMPLANT
CATH ROBINSON RED A/P 18FR (CATHETERS) IMPLANT
CLIP LIGATING EXTRA MED SLVR (CLIP) ×1 IMPLANT
CLIP LIGATING EXTRA SM BLUE (MISCELLANEOUS) ×1 IMPLANT
COVER DOME SNAP 22 D (MISCELLANEOUS) ×1 IMPLANT
COVER PROBE W GEL 5X96 (DRAPES) ×1 IMPLANT
DERMABOND ADVANCED .7 DNX12 (GAUZE/BANDAGES/DRESSINGS) ×1 IMPLANT
DERMABOND ADVANCED .7 DNX6 (GAUZE/BANDAGES/DRESSINGS) IMPLANT
DRAPE C-ARM 42X72 X-RAY (DRAPES) IMPLANT
DRAPE FEMORAL ANGIO 80X135IN (DRAPES) ×1 IMPLANT
DRSG TEGADERM 2-3/8X2-3/4 SM (GAUZE/BANDAGES/DRESSINGS) IMPLANT
ELECT REM PT RETURN 9FT ADLT (ELECTROSURGICAL) ×1
ELECTRODE REM PT RTRN 9FT ADLT (ELECTROSURGICAL) ×1 IMPLANT
GAUZE SPONGE 2X2 8PLY STRL LF (GAUZE/BANDAGES/DRESSINGS) IMPLANT
GLOVE BIOGEL PI IND STRL 6 (GLOVE) IMPLANT
GLOVE BIOGEL PI IND STRL 8 (GLOVE) IMPLANT
GLOVE ECLIPSE 7.0 STRL STRAW (GLOVE) IMPLANT
GLOVE INDICATOR 7.0 STRL GRN (GLOVE) IMPLANT
GLOVE INDICATOR 7.5 STRL GRN (GLOVE) IMPLANT
GOWN STRL REUS W/ TWL LRG LVL3 (GOWN DISPOSABLE) ×2 IMPLANT
GOWN STRL REUS W/TWL 2XL LVL3 (GOWN DISPOSABLE) ×2 IMPLANT
GOWN STRL REUS W/TWL LRG LVL3 (GOWN DISPOSABLE) ×2
GUIDEWIRE ENROUTE 0.014 (WIRE) ×1 IMPLANT
HEMOSTAT SNOW SURGICEL 2X4 (HEMOSTASIS) IMPLANT
INTRODUCER KIT GALT 7CM (INTRODUCER) ×1
KIT BASIN OR (CUSTOM PROCEDURE TRAY) ×1 IMPLANT
KIT ENCORE 26 ADVANTAGE (KITS) ×1 IMPLANT
KIT INTRODUCER GALT 7 (INTRODUCER) ×1 IMPLANT
KIT TURNOVER KIT B (KITS) ×1 IMPLANT
NDL HYPO 25GX1X1/2 BEV (NEEDLE) IMPLANT
NEEDLE HYPO 22GX1.5 SAFETY (NEEDLE) IMPLANT
NEEDLE HYPO 25GX1X1/2 BEV (NEEDLE) IMPLANT
PACK CAROTID (CUSTOM PROCEDURE TRAY) ×1 IMPLANT
POSITIONER HEAD DONUT 9IN (MISCELLANEOUS) ×1 IMPLANT
PROTECTION STATION PRESSURIZED (MISCELLANEOUS) ×1
SET MICROPUNCTURE 5F STIFF (MISCELLANEOUS) ×1 IMPLANT
STATION PROTECTION PRESSURIZED (MISCELLANEOUS) ×1 IMPLANT
STENT TRANSCAROTID SYSTEM 8X40 (Permanent Stent) IMPLANT
SUT MNCRL AB 4-0 PS2 18 (SUTURE) ×1 IMPLANT
SUT PROLENE 5 0 C 1 24 (SUTURE) ×1 IMPLANT
SUT SILK  1 MH (SUTURE) ×1
SUT SILK 1 MH (SUTURE) IMPLANT
SUT SILK 2 0 PERMA HAND 18 BK (SUTURE) IMPLANT
SUT SILK 2 0 SH (SUTURE) ×1 IMPLANT
SUT SILK 3 0 (SUTURE)
SUT SILK 3-0 18XBRD TIE 12 (SUTURE) IMPLANT
SUT VIC AB 3-0 SH 27 (SUTURE) ×1
SUT VIC AB 3-0 SH 27X BRD (SUTURE) ×1 IMPLANT
SYR 10ML LL (SYRINGE) ×3 IMPLANT
SYR 20ML LL LF (SYRINGE) ×1 IMPLANT
SYR CONTROL 10ML LL (SYRINGE) IMPLANT
SYSTEM TRANSCAROTID NEUROPRTCT (MISCELLANEOUS) ×1 IMPLANT
TOWEL GREEN STERILE (TOWEL DISPOSABLE) ×1 IMPLANT
TRANSCAROTID NEUROPROTECT SYS (MISCELLANEOUS) ×1
TUBE TRACH FLEX 6.0 UNCF (MISCELLANEOUS) IMPLANT
WATER STERILE IRR 1000ML POUR (IV SOLUTION) ×1 IMPLANT
WIRE BENTSON .035X145CM (WIRE) ×1 IMPLANT

## 2022-08-17 NOTE — Progress Notes (Signed)
ATC set up, pt refused x 2 to wear humidity.  I educated patient and family on importance of pt wearing humidity to help prevent mucous plugging.  Pt and family aware that humididy is set up and turned on laying next to pt if he changes his mind.  No distress noted.  BBSH clear diminished.  Pt currently states he does not need suctioning.  Spare trach and inner cannulas ordered.  RN aware.

## 2022-08-17 NOTE — Progress Notes (Signed)
STROKE TEAM PROGRESS NOTE   SUBJECTIVE (INTERVAL HISTORY) His sons, granddaughter and wife are at the bedside.  Patient had left [facility] today with Dr. Unk Lightning, doing well.  Still on trach, follows simple commands.  Moving all extremities.   OBJECTIVE Temp:  [97.6 F (36.4 C)-98.9 F (37.2 C)] 98.2 F (36.8 C) (01/23 1703) Pulse Rate:  [67-140] 76 (01/23 1703) Cardiac Rhythm: Sinus tachycardia (01/23 1508) Resp:  [15-30] 17 (01/23 1703) BP: (111-136)/(68-98) 112/68 (01/23 1703) SpO2:  [93 %-100 %] 95 % (01/23 1703) Arterial Line BP: (118-137)/(64-77) 129/64 (01/23 1700) FiO2 (%):  [21 %] 21 % (01/22 2024)  Recent Labs  Lab 08/15/22 1250  GLUCAP 139*   Recent Labs  Lab 08/15/22 1304 08/15/22 1312 08/16/22 0354 08/17/22 0032 08/17/22 1358  NA 134* 137 136 137  --   K 4.3 4.5 3.9 4.0  --   CL 101 102 106 105  --   CO2 24  --  22 23  --   GLUCOSE 125* 127* 100* 113*  --   BUN '22 22 17 16  '$ --   CREATININE 1.60* 1.60* 1.42* 1.62* 1.51*  CALCIUM 8.7*  --  8.4* 8.6*  --    Recent Labs  Lab 08/15/22 1304 08/16/22 0354  AST 23 18  ALT 22 18  ALKPHOS 103 89  BILITOT 0.4 0.3  PROT 7.2 6.2*  ALBUMIN 3.6 3.1*   Recent Labs  Lab 08/11/22 0421 08/15/22 1304 08/15/22 1312 08/16/22 0354 08/17/22 0032 08/17/22 1358  WBC 9.6 9.1  --  9.9 10.0 10.5  NEUTROABS 6.5 6.3  --   --   --   --   HGB 12.2* 12.2* 13.3 11.7* 12.4* 12.4*  HCT 38.0* 38.6* 39.0 35.3* 38.0* 39.8  MCV 90.3 91.7  --  89.4 89.4 92.8  PLT 232 323  --  272 269 281   No results for input(s): "CKTOTAL", "CKMB", "CKMBINDEX", "TROPONINI" in the last 168 hours. Recent Labs    08/15/22 1304  LABPROT 14.8  INR 1.2   Recent Labs    08/15/22 1658  COLORURINE STRAW*  LABSPEC 1.020  PHURINE 6.0  GLUCOSEU NEGATIVE  HGBUR SMALL*  BILIRUBINUR NEGATIVE  KETONESUR NEGATIVE  PROTEINUR NEGATIVE  NITRITE NEGATIVE  LEUKOCYTESUR TRACE*       Component Value Date/Time   CHOL 102 08/16/2022 0354   CHOL 110  09/26/2020 0822   TRIG 208 (H) 08/16/2022 0354   HDL 28 (L) 08/16/2022 0354   HDL 34 (L) 09/26/2020 0822   CHOLHDL 3.6 08/16/2022 0354   VLDL 42 (H) 08/16/2022 0354   LDLCALC 32 08/16/2022 0354   LDLCALC 44 09/26/2020 0822   Lab Results  Component Value Date   HGBA1C 6.4 (H) 08/17/2022      Component Value Date/Time   LABOPIA NONE DETECTED 08/15/2022 1658   COCAINSCRNUR NONE DETECTED 08/15/2022 1658   LABBENZ NONE DETECTED 08/15/2022 1658   AMPHETMU NONE DETECTED 08/15/2022 1658   THCU NONE DETECTED 08/15/2022 1658   LABBARB NONE DETECTED 08/15/2022 1658    Recent Labs  Lab 08/15/22 1304  ETH <10    I have personally reviewed the radiological images below and agree with the radiology interpretations.  DG C-Arm 1-60 Min  Result Date: 08/17/2022 CLINICAL DATA:  82 year old male with carotid artery disease EXAM: DG C-ARM 1-60 MIN COMPARISON:  None Available. FINDINGS: Limited intraoperative angiogram during trans carotid stenting. Pre stent angiogram and post stent angiogram demonstrating patent carotid stent and ICA. IMPRESSION: Limited images  during pre and post angiogram for trans carotid stenting. Please refer to the dictated operative report for full details of intraoperative findings and procedure. Electronically Signed   By: Corrie Mckusick D.O.   On: 08/17/2022 16:43   Structural Heart Procedure  Result Date: 08/17/2022 See surgical note for result.  MR BRAIN WO CONTRAST  Result Date: 08/16/2022 CLINICAL DATA:  Neuro deficit, acute, stroke suspected. EXAM: MRI HEAD WITHOUT CONTRAST TECHNIQUE: Multiplanar, multiecho pulse sequences of the brain and surrounding structures were obtained without intravenous contrast. COMPARISON:  CT studies done yesterday FINDINGS: Brain: Diffusion imaging does not show any acute or subacute infarction affecting the brainstem, cerebellum or right cerebral hemisphere. Within the left cerebral hemisphere, there are numerous discrete foci of acute  infarction affecting the cortical and subcortical brain extending from front to back at the frontoparietal vertex consistent with watershed distribution infarctions. No large confluent infarction. Regions of infarction show only minimal swelling. No evidence of hemorrhage. Otherwise, there chronic small-vessel ischemic changes of the pons and throughout the cerebral hemispheric white matter. There is an old left occipital cortical and subcortical infarction. No mass, hydrocephalus or extra-axial collection Vascular: Major vessels at the base of the brain show flow. Skull and upper cervical spine: Negative Sinuses/Orbits: Clear/normal Other: None IMPRESSION: 1. Numerous foci of acute infarction in the left cerebral hemisphere at the frontoparietal vertex consistent with watershed distribution infarctions. No large confluent infarction. No evidence of hemorrhage. 2. Old left occipital cortical and subcortical infarction. 3. Extensive chronic small-vessel ischemic changes elsewhere affecting the pons and cerebral hemispheric white matter. Electronically Signed   By: Nelson Chimes M.D.   On: 08/16/2022 09:57   CT ANGIO HEAD NECK W WO CM W PERF (CODE STROKE)  Result Date: 08/15/2022 CLINICAL DATA:  Neuro deficit, acute, stroke suspected EXAM: CT ANGIOGRAPHY HEAD AND NECK CT PERFUSION BRAIN TECHNIQUE: Multidetector CT imaging of the head and neck was performed using the standard protocol during bolus administration of intravenous contrast. Multiplanar CT image reconstructions and MIPs were obtained to evaluate the vascular anatomy. Carotid stenosis measurements (when applicable) are obtained utilizing NASCET criteria, using the distal internal carotid diameter as the denominator. Multiphase CT imaging of the brain was performed following IV bolus contrast injection. Subsequent parametric perfusion maps were calculated using RAPID software. RADIATION DOSE REDUCTION: This exam was performed according to the departmental  dose-optimization program which includes automated exposure control, adjustment of the mA and/or kV according to patient size and/or use of iterative reconstruction technique. CONTRAST:  151m OMNIPAQUE IOHEXOL 350 MG/ML SOLN COMPARISON:  None Available. Same day CT head.  CTA head/neck January 20, 2021. FINDINGS: CTA NECK FINDINGS Aortic arch: Great vessel origins are patent. Right carotid system: Stenting of the common carotid artery and the proximal internal carotid artery. The stent is patent. There is moderate (approximately 60-70% stenosis in the common carotid stent). Left carotid system: Common carotid artery is patent. Critical stenosis of the left proximal ICA in the neck with trace (approximately 1 mm diameter) luminal opacification. Findings are mildly progressed from the prior. There is opacification of the more distal ICA in the neck. Vertebral arteries: Patent bilaterally. No evidence of hemodynamically significant stenosis in the neck. Skeleton: Multilevel degenerative change. No acute findings on limited assessment. Other neck: No acute findings on limited assessment. A dedicated CT neck could better assess for malignancy. Upper chest: Visualized lung apices are largely clear with evidence of expiratory changes. Review of the MIP images confirms the above findings CTA HEAD FINDINGS  Anterior circulation: Bilateral intracranial ICAs are patent. Moderate stenosis of the right cavernous and paraclinoid ICA. Bilateral MCAs and ACAs are patent. Posterior circulation: Chronically severely stenotic versus occluded right intradural vertebral artery. Left intradural vertebral artery is patent with moderate stenosis. Basilar artery and bilateral posterior cerebral arteries are patent. Mild right and moderate left P2 PCA stenosis. Venous sinuses: As permitted by contrast timing, patent. Review of the MIP images confirms the above findings CT Brain Perfusion Findings: ASPECTS: 10 CBF (<30%) Volume: 25m Perfusion  (Tmax>6.0s) volume: 1272mMismatch Volume: 12754mnfarction Location:None identified. IMPRESSION: 1. Critical stenosis of the left proximal ICA in the neck with trace (approximately 1 mm diameter) luminal opacification. Findings are mildly progressed from the prior. 2. On CT perfusion, approximately 127 mL of the reported penumbra in the left cerebrum which likely relates to the above. No core infarct identified. 3. Right common carotid artery and internal carotid artery stent. Moderate to severe stenosis of the common carotid artery stent with approximately 60-70% stenosis. A catheter arteriogram could more accurately quantify/characterize if clinically warranted. 4. 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 5. Moderate right intracranial ICA stenosis. 6. Moderate left and mild right P2 PCA stenosis. Findings discussed with Dr. Arie Powell Erlinda Honga telephone at 2:35 p.m. Electronically Signed   By: FreMargaretha SheffieldD.   On: 08/15/2022 14:40   CT HEAD CODE STROKE WO CONTRAST  Result Date: 08/15/2022 CLINICAL DATA:  Code stroke.  Neuro deficit, acute, stroke suspected EXAM: CT HEAD WITHOUT CONTRAST TECHNIQUE: Contiguous axial images were obtained from the base of the skull through the vertex without intravenous contrast. RADIATION DOSE REDUCTION: This exam was performed according to the departmental dose-optimization program which includes automated exposure control, adjustment of the mA and/or kV according to patient size and/or use of iterative reconstruction technique. COMPARISON:  January 20, 2021. FINDINGS: Brain: No evidence of acute large vascular territory infarction, hemorrhage, hydrocephalus, extra-axial collection or mass lesion/mass effect. Similar patchy areas of periventricular and subcortical white matter hypodensity, nonspecific but compatible with chronic microvascular ischemic disease. Vascular: No hyperdense vessel identified. Skull: No acute fracture Sinuses/Orbits: Clear  sinuses.  No acute orbital findings. Other: No mastoid effusions. ASPECTS ([facility] Early CT Score) total score (0-10 with 10 being normal): 10. IMPRESSION: 1. No evidence of acute intracranial abnormality.  ASPECTS is 10. 2. Similar chronic microvascular ischemic disease. Code stroke imaging results were communicated on 08/15/2022 at 1:18 pm to provider Dr. ZamRoderic Palaua telephone, who verbally acknowledged these results. Electronically Signed   By: FreMargaretha SheffieldD.   On: 08/15/2022 13:19   ECHOCARDIOGRAM COMPLETE  Result Date: 08/09/2022    ECHOCARDIOGRAM REPORT   Patient Name:   Kenneth SMICKte of Exam: 08/09/2022 Medical Rec #:  [id number]     Height:       65.0 in Accession #:    [id number]    Weight:       182.1 lb Date of Birth:  [birth year]     BSA:          1.901 m Patient Age:    [age]ars         BP:           122/80 mmHg Patient Gender: M                HR:           81 bpm. [facility]  Procedure: 2D Echo, Cardiac Doppler, Color Doppler and Intracardiac            Opacification Agent Indications:    Atrial Fibrillation I48.91  History:        Patient has prior history of Echocardiogram examinations, most                 recent 12/18/2020. CAD and Previous Myocardial Infarction, Prior                 CABG, Stroke, Signs/Symptoms:Chest Pain; Risk                 Factors:Dyslipidemia, Hypertension and Current Smoker.  Sonographer:    Greer Pickerel Referring Phys: [id number] [place] [facility]  Sonographer Comments: Image acquisition challenging due to respiratory motion. IMPRESSIONS  1. Left ventricular ejection fraction, by estimation, is 55 to 60%. The left ventricle has normal function. The left ventricle has no regional wall motion abnormalities. Left ventricular diastolic function could not be evaluated.  2. Right ventricular systolic function is mildly reduced. The right ventricular size is mildly enlarged.  3. The mitral valve is grossly normal. No evidence of  mitral valve regurgitation. No evidence of mitral stenosis.  4. The aortic valve was not well visualized. There is mild calcification of the aortic valve. Aortic valve regurgitation is not visualized. No aortic stenosis is present.  5. Aortic dilatation noted. There is borderline dilatation of the aortic root, measuring 38 mm. Comparison(s): Changes from prior study are noted. LVEF improved from 45-50% in 08/2021 tio 55-60% now. FINDINGS  Left Ventricle: Left ventricular ejection fraction, by estimation, is 55 to 60%. The left ventricle has normal function. The left ventricle has no regional wall motion abnormalities. Definity contrast agent was given IV to delineate the left ventricular  endocardial borders. The left ventricular internal cavity size was normal in size. There is no left ventricular hypertrophy. Left ventricular diastolic function could not be evaluated due to atrial fibrillation. Left ventricular diastolic function could  not be evaluated. Right Ventricle: The right ventricular size is mildly enlarged. No increase in right ventricular wall thickness. Right ventricular systolic function is mildly reduced. Left Atrium: Left atrial size was normal in size. Right Atrium: Right atrial size was normal in size. Pericardium: There is no evidence of pericardial effusion. Mitral Valve: The mitral valve is grossly normal. No evidence of mitral valve regurgitation. No evidence of mitral valve stenosis. Tricuspid Valve: The tricuspid valve is grossly normal. Tricuspid valve regurgitation is not demonstrated. No evidence of tricuspid stenosis. Aortic Valve: The aortic valve was not well visualized. There is mild calcification of the aortic valve. There is mild aortic valve annular calcification. Aortic valve regurgitation is not visualized. No aortic stenosis is present. Pulmonic Valve: The pulmonic valve was not well visualized. Pulmonic valve regurgitation is not visualized. No evidence of pulmonic stenosis.  Aorta: Aortic dilatation noted. There is borderline dilatation of the aortic root, measuring 38 mm. Venous: The inferior vena cava was not well visualized. IAS/Shunts: The interatrial septum was not well visualized.  LEFT VENTRICLE PLAX 2D LVIDd:         4.50 cm   Diastology LVIDs:         3.30 cm   LV e' medial:    6.68 cm/s LV PW:         1.10 cm   LV E/e' medial:  13.4 LV IVS:        0.90 cm   LV e' lateral:  13.40 cm/s LVOT diam:     2.00 cm   LV E/e' lateral: 6.7 LV SV:         52 LV SV Index:   27 LVOT Area:     3.14 cm  RIGHT VENTRICLE RV S prime:     10.10 cm/s TAPSE (M-mode): 1.2 cm LEFT ATRIUM         Index       RIGHT ATRIUM           Index LA diam:    4.10 cm 2.16 cm/m  RA Area:     17.70 cm                                 RA Volume:   46.20 ml  24.30 ml/m  AORTIC VALVE LVOT Vmax:   114.00 cm/s LVOT Vmean:  74.100 cm/s LVOT VTI:    0.165 m  AORTA Ao Root diam: 3.80 cm MITRAL VALVE MV Area (PHT): 4.77 cm    SHUNTS MV Decel Time: 159 msec    Systemic VTI:  0.16 m MV E velocity: 89.50 cm/s  Systemic Diam: 2.00 cm Kenneth [name] Electronically signed by Lorelee Cover [name] Signature Date/Time: 08/09/2022/4:22:54 PM    Final    DG Chest Portable 1 View  Result Date: 08/07/2022 CLINICAL DATA:  Chest pain EXAM: PORTABLE CHEST 1 VIEW COMPARISON:  05/30/2022 FINDINGS: Single frontal view of the chest demonstrates tracheostomy tube unchanged. Cardiac silhouette is stable. Postsurgical changes from median sternotomy. No acute airspace disease, effusion, or pneumothorax. Chronic elevation right hemidiaphragm. No acute bony abnormality. IMPRESSION: 1. Stable chest, no acute process. Electronically Signed   By: Randa Ngo M.D.   On: 08/07/2022 22:16     PHYSICAL EXAM  Temp:  [97.6 F (36.4 C)-98.9 F (37.2 C)] 98.2 F (36.8 C) (01/23 1703) Pulse Rate:  [67-140] 76 (01/23 1703) Resp:  [15-30] 17 (01/23 1703) BP: (111-136)/(68-98) 112/68 (01/23 1703) SpO2:  [93 %-100 %] 95 % (01/23  1703) Arterial Line BP: (118-137)/(64-77) 129/64 (01/23 1700) FiO2 (%):  [21 %] 21 % (01/22 2024)  General - Well nourished, well developed, in no apparent distress.  Ophthalmologic - fundi not visualized due to noncooperation.  Cardiovascular - irregularly irregular heart rate and rhythm.  Neuro - awake, alert, eyes open, on trach without speaking valve but able to mouth words. No aphasia, trach in place, following all simple commands. Able to name and repeat. No gaze palsy, tracking bilaterally, visual field seem to be full today. No facial droop. Tongue midline. Bilateral UEs and LEs symmetrical strength. Sensation symmetrical bilaterally, b/l FTN intact but slow, gait not tested.     ASSESSMENT/PLAN Kenneth [name] is a 82 y.o. 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 He did have left eye ptosis and left hand numbness yesterday but fully resolved. NIHSS =4. CT no acute finding. Pt not TNK candidate due to on eliquis this am.     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 Findings are progressed from the prior. Right common carotid artery and internal carotid artery stent. Moderate to severe stenosis of the common carotid artery stent with approximately 60-70% stenosis. 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 Moderate right intracranial ICA stenosis. Moderate left and mild right P2 PCA stenosis. CTP - 0/127 MRI - Numerous foci of acute infarction in the left  cerebral hemisphere at the frontoparietal vertex consistent with watershed distribution infarctions.  2D Echo  EF 55-60% LDL 32 HgbA1c 6.4 UDS neg Heparin IV for VTE prophylaxis clopidogrel 75 mg daily and Eliquis (apixaban) daily prior to admission, now on clopidogrel 75 mg daily and heparin IV. Once stable, can transition heparin IV back to eliquis. Patient counseled to be compliant with his antithrombotic medications Ongoing aggressive stroke risk factor management Therapy recommendations:  pending Disposition:  pending  Bilateral carotid stenosis Had right ICA stenting in 05/2012 and right CCA stenting in 04/2018. Also high-grade stenosis on the left ICA.  In 12/2020 CT head and neck showed right CCA 70% in-stent stenosis, right ICA 40% in-stent stenosis.  Left ICA 65% stenosis.  Carotid Doppler in 04/2021 showed right CCA stent 50 to 75% stenosis, however left ICA 80 to 99% stenosis.  He has left ICA stenosis so far asymptomatic, no procedure planned and the patient is undergoing surveillance with biannual carotid duplex.  He follows up with Dr. Stanford Breed in clinic.   Now s/p left TCAR today with Dr. Virl Cagey On heparin IV and plavix. Once stable, can transition heparin IV back to eliquis.   Hx of stroke/TIA 11/2020 for episode of dizziness and gait instability.  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  Carotid Doppler showed left ICA 70 to 99% stenosis, however considered asymptomatic at that time.   CAD NSTEMI Afib S/p CABG admitted 1 week ago for chest pain and hypertensive urgency. Treated with nitroglycerin drip. Prior to discharge, he was found to have A-fib RVR, EF 55 to 60%. Discharged on Eliquis. Patient takes Eliquis at home, last dose this morning per wife.  Now on heparin IV and plavix, can transition heparin IV back to eliquis.  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 After hours, contact General Neurology

## 2022-08-17 NOTE — Anesthesia Postprocedure Evaluation (Signed)
Anesthesia Post Note  Patient: Kenneth [name]  Procedure(s) Performed: Transcarotid Artery Revascularization (Left: Neck)     Patient location during evaluation: PACU Anesthesia Type: General Level of consciousness: awake and alert Pain management: pain level controlled Vital Signs Assessment: post-procedure vital signs reviewed and stable Respiratory status: spontaneous breathing, nonlabored ventilation, respiratory function stable and patient connected to tracheostomy mask oxygen Cardiovascular status: blood pressure returned to baseline and stable Postop Assessment: no apparent nausea or vomiting Anesthetic complications: no  No notable events documented.  Last Vitals:  Vitals:   08/17/22 1200 08/17/22 1300  BP: 113/78 (!) 114/94  Pulse: (!) 121 (!) 116  Resp: 17 18  Temp:    SpO2: 98% 93%    Last Pain:  Vitals:   08/17/22 1300  TempSrc:   PainSc: 0-No pain                 Donalyn Schneeberger,W. EDMOND

## 2022-08-17 NOTE — Progress Notes (Addendum)
OT Cancellation Note  Patient Details Name: Kenneth [name] MRN: [id number] DOB: January 03, 1941   Cancelled Treatment:    Reason Eval/Treat Not Completed: Active bedrest order (Will follow up for OT evaluation as schedule permits.)  [id number]: Pt off floor, still with active bedrest orders.  Renaye Rakers, OTD, OTR/L SecureChat Preferred Acute Re[facility] [phone]  Ulla Gallo 08/17/2022, 7:09 AM

## 2022-08-17 NOTE — Transfer of Care (Signed)
Immediate Anesthesia Transfer of Care Note  Patient: Kenneth [name]  Procedure(s) Performed: Transcarotid Artery Revascularization (Left: Neck)  Patient Location: PACU  Anesthesia Type:General  Level of Consciousness: awake, alert , and drowsy  Airway & Oxygen Therapy: Patient Spontanous Breathing and Patient connected to face mask oxygen  Post-op Assessment: Report given to RN, Post -op Vital signs reviewed and stable, and Patient moving all extremities X 4  Post vital signs: Reviewed and stable  Last Vitals:  Vitals Value Taken Time  BP 122/86 08/17/22 1000  Temp    Pulse 116 08/17/22 1008  Resp 16 08/17/22 1008  SpO2 100 % 08/17/22 1008  Vitals shown include unvalidated device data.  Last Pain:  Vitals:   08/17/22 0628  TempSrc: Oral  PainSc:          Complications: No notable events documented.

## 2022-08-17 NOTE — Anesthesia Procedure Notes (Addendum)
Date/Time: 08/17/2022 8:01 AM  Performed by: Mariea Clonts, CRNAPre-anesthesia Checklist: Patient identified, Emergency Drugs available, Suction available, Patient being monitored and Timeout performed Patient Re-evaluated:Patient Re-evaluated prior to induction Oxygen Delivery Method: Circle system utilized Preoxygenation: Pre-oxygenation with 100% oxygen Induction Type: IV induction and Tracheostomy Tube type: Reinforced Tube size: 6.0 mm Placement Confirmation: positive ETCO2 and breath sounds checked- equal and bilateral Comments: 6.0 reinforced ett passed through trach stoma. Cuff inflated. Ett sutured to skin by Dr. Virl Cagey.

## 2022-08-17 NOTE — Progress Notes (Signed)
OT Cancellation Note  Patient Details Name: YARED BAREFOOT MRN: [id number] DOB: 10-31-1940   Cancelled Treatment:    Reason Eval/Treat Not Completed: Active bedrest order (OT to f/u as activity orders progress.)  Elliot Cousin 08/17/2022, 2:43 PM

## 2022-08-17 NOTE — Op Note (Signed)
NAME: ENZO TREU    MRN: [id number] DOB: [birth year]    DATE OF OPERATION: 08/17/2022  PREOP DIAGNOSIS:    Left-sided symptomatic carotid artery stenosis  POSTOP DIAGNOSIS:    Same  PROCEDURE:    Left-sided transcarotid artery revascularization  SURGEON: Broadus John  ASSIST: Roxy Horseman, PA  ANESTHESIA: General  EBL: 35m  INDICATIONS:    PIBRAHEM VOLKMANis a 82y.o. male.  With history of previous right-sided carotid stenting for stenosis.  He also has a history of neck radiation, tracheostomy status post cancer.  He was turned down for elective carotid revascularization in the symptomatic setting.  He presented to the hospital status post stroke with left ICA stenosis.  We long, detailed discussion regarding his left-sided symptomatic carotid stenosis.  There are multiple treatment modalities, however in the setting of tracheostomy, irradiated neck, I think the 2 best treatment options are TCAR versus transfemoral stenting.  In evaluating the lesion, there is significant disease in the common carotid artery, that does not appear to be flow-limiting, but is concerning for embolic risk.  I looked at the images with multiple partners in an effort to best quantify falls risk for both transcarotid artery stenting versus transfemoral stenting.   In short I think that transcarotid artery revascularization carries a lower stroke rate due to flow reversal, however in the setting of a radiated field, cranial nerve injury risk, poor healing risk.  The tracheostomy also increases the infection risk.  Transfemoral stenting carries no cranial nerve risk, no infection risk, no poor wound healing risk, but does carry a higher stroke risk. Regardless both carry a higher than normal stroke risk due to the common carotid lesion.    Both myself and my partners agree that transcarotid artery revascularization is overall the safer procedure.  I had a detailed discussion with PAndreu outlining the risks and benefits of both.  After discussing these, he elected to proceed with left-sided transcarotid artery revascularization.    FINDINGS:   Greater than 99% tandem, stenosis of the left internal carotid artery  TECHNIQUE:   The patient was brought to the operating room, where support lines were placed and general anesthesia was secured. The left neck and right groin were prepped and the patient was sterilely draped. A transverse 2-4 cm incision was made between the sternal and clavicular heads of the sternocleidomastoid muscle, below the omohyoid. Following longitudinal division of the carotid sheath the jugular vein was partially dissected and retracted medially. Once 3 cm of common carotid artery (CCA) were isolated, umbilical tape was placed around the proximal 1/3 of the CCA under direct vision. A 5.0 polypropylene suture was pre-placed in the anterior wall of the CCA, in a "U stitch" configuration, close to the clavicle to facilitate hemostasis upon removal of the arterial sheath at completion of the TCAR procedure.  The contralateral (left) common femoral vein (CFV) was accessed under ultrasound guidance, using standard Seldinger and micropuncture access technique. Permanent recorded image(s) was/were saved in the patient's medical record. The Venous Return Sheath was advanced into the CFV over the 0.035" wire provided. Blood was aspirated from the flow line followed by flushing of the Venous Sheath with heparinized saline. The Venous Sheath was secured to the patient's skin with suture to maintain optimal position in the vessel.  Heparin was given to obtain a therapeutic activated clotting time >250 seconds prior to arterial access.  Due to the significant amount of atherosclerotic disease, I elected to  clamp prior to wire manipulation.  The left common carotid was clamped, and A 4-French non-stiffened ENHANCE Transcarotid / Peripheral Access set was used, puncturing the  artery with the 21G needle through the pre-placed "U" stitch while holding gentle traction on the umbilical tape to stabilize and centralize the CCA within the incision. Careful attention was paid to the change in CCA shape when using the umbilical tape to control or lift the artery. The micropuncture wire was then advanced 3-4 cm into the CCA and, the 21G needle was removed. The micropuncture sheath was advanced 2-3 cm into the CCA and the wire and dilator were removed. Pulsatile backflow indicated correct positioning. The provided 0.035" J-tipped guidewire was inserted as close as possible to the bifurcation without engaging the lesion. After micropuncture sheath removal, the Transcarotid Arterial Sheath was advanced to the 2.5cm marker and the 0.035" wire and dilator were then removed. Arterial Sheath position was assessed under fluoroscopy in two projections to ensure that the sheath tip was oriented coaxially in the CCA. The Arterial Sheath was sutured to the patient with gentle forward tension. Blood was slowly aspirated followed by flushing with heparinized saline. No ingress of air bubbles through the passive hemostatic valve was observed. The stopcocks were closed. Traction applied to the CCA previously to facilitate access was gently released.  The Flow Controller was connected to the Transcarotid Arterial Sheath, prepared by passively allowing a column of arterial blood to fill the line and connected to the Venous Return Sheath. CCA inflow was occluded proximal to the arteriotomy with a vascular clamp to achieve active flow reversal. To confirm flow reversal, a saline bolus was delivered into the venous flow line on both "High" and "Low" flow settings of the Flow Controller. Angiograms were performed with slow injections of a small amount of contrast filling just past the lesion to minimize antegrade transmission of micro-bubbles.  Prior to lesion manipulation, heart rate (95KDT) and systolic BP  (267-124PYKD) were managed upwards to optimize flow reversal and procedural neuroprotection. The lesion was crossed with an 0.014" ENROUTE guidewire and pre-dilation of the lesion was performed with a 26m x 362mrapid exchange 0.014" compatible balloon catheter to 8 atmospheres for 10 seconds. Stenting was performed with an 83m57m 81m85mROUTE Transcarotid stent, sized appropriately to the right CCA. AP and lateral angiograms (gentle contrast injections) were performed to confirm stent placement and arterial wall stent apposition.  I elected to read ballooned using a 6 mm x 30 mm rapid exchange 0.014 balloon.  Following this, there was some spasm proximally, and therefore 200 mcg of nitroglycerin were administered.  Follow-up angiography demonstrated significant improvement.  [facility], antegrade flow was restored by releasing the clamp on the CCA then closing the NPS stopcocks to the flow lines. The Transcarotid Arterial Sheath was removed and the pre-closure suture was tied. Heparin reversal was employed.  The lesion was irrigated with normal saline and closed in layers of 3-0 Vicryl suture with Monocryl and Dermabond at the level of the skin.  The Venous Return Sheath was removed and hemostasis was achieved with brief manual compression.  The patient tolerated the procedure well and was extubated on the table. The patient was moving all four extremities to command prior to transfer to the recovery room.   JoshMacie Burows [facility]:   08/17/2022

## 2022-08-17 NOTE — Progress Notes (Signed)
[facility] PROGRESS NOTE   Kenneth [name] RKY:[id number] DOB: 11/30/1940 DOA: 08/15/2022  PCP: [name], MD  Brief History/Interval Summary: 82 y.o. 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  Neurology was consulted.  Patient was transferred to [facility] due to known history of critical//severe carotid artery disease.  Consultants: Neurology.  Vascular surgery  Procedures: Echocardiogram was done recently    Subjective/Interval History: Patient seen in the PACU.  Feels well.  Procedure went well.  Denies any pain.  Denies any shortness of breath or palpitations.      Assessment/Plan:  Acute CVA Patient presented with left hand numbness and right hand weakness.  Symptoms resolved. MRI showed multiple infarcts in the left cerebral hemisphere suggesting watershed etiology. LDL 32.  HbA1c 6.4 Echocardiogram was done recently which showed normal systolic function of the left ventricle.  Right ventricular function was noted to be mildly low. Neurology is following. Patient is on Plavix and Zetia.  Eliquis is on hold.  Started on heparin due to critical disease of the left ICA. PT and OT evaluation.  SLP evaluation.  Carotid artery disease Severe/critical disease of the left ICA was noted on CT angiogram.  Previous stent noted in the right carotid artery with moderate to severe disease. Vascular surgery consulted.  Patient underwent TCAR today.   Currently on IV heparin.  Atrial fibrillation, persistent Looks like this was diagnosed recently.  Seen by cardiology last week for the same.  Patient was started on Eliquis. Eliquis currently on hold due to need for vascular intervention.  Beta-blockers were placed on hold to allow permissive  hypertension.   Heart rate noted to be poorly controlled.  Continue with IV metoprolol as needed.  Will place him on oral metoprolol when he gets back to the floor.      History of laryngeal cancer with chronic tracheostomy Radiation treatment previously.  Has chronic tracheostomy. Hyperlipidemia/coronary artery disease Status post CABG Continue Zetia.    Chronic kidney disease stage IIIa Baseline creatinine seems to be around 1.5-1.6.  Renal function at baseline.  Monitor urine output.  Avoid nephrotoxic agents.    History of depression Continue Paxil  DVT Prophylaxis: On IV heparin Code Status: Full code Family Communication: Discussed with the patient Disposition Plan: Hopefully return home in improved.  PT/OT pending.  Status is: Inpatient Remains inpatient appropriate because: Acute stroke    Medications: Scheduled:  aspirin  81 mg Oral Once   [MAR Hold] clopidogrel  75 mg Oral Daily   [MAR Hold] ezetimibe  10 mg Oral Daily   [MAR Hold] pantoprazole  40 mg Oral Daily   [MAR Hold] PARoxetine  20 mg Oral Daily   Continuous:  sodium chloride 75 mL/hr at 08/17/22 0140   heparin 1,400 Units/hr (08/17/22 0216)   PRN:[MAR Hold] acetaminophen **OR** [MAR Hold] acetaminophen (TYLENOL) oral liquid 160 mg/5 mL **OR** [MAR Hold] acetaminophen, fentaNYL (SUBLIMAZE) injection, [MAR Hold] metoprolol tartrate, [MAR Hold] senna-docusate  Antibiotics: Anti-infectives (From admission, onward)    Start     Dose/Rate Route Frequency Ordered Stop   08/17/22 0727  clindamycin (CLEOCIN) 900 MG/50ML IVPB       Note to Pharmacy: Humberto Leep O: cabinet override      08/17/22 0727 08/17/22 0822       Objective:  Vital Signs  Vitals:  08/17/22 0628 08/17/22 1000 08/17/22 1015 08/17/22 1030  BP: 131/88 122/86 (!) 116/93 (!) 132/98  Pulse: 100 (!) 116 (!) 129 (!) 128  Resp: (!) '21 17 19 17  '$ Temp: 97.6 F (36.4 C) 98.8 F (37.1 C)    TempSrc: Oral     SpO2: 94% 97% 100% 100%   Weight:      Height:        Intake/Output Summary (Last 24 hours) at 08/17/2022 1038 Last data filed at 08/17/2022 0955 Gross per 24 hour  Intake 1751.53 ml  Output 350 ml  Net 1401.53 ml    Filed Weights   08/15/22 1500  Weight: 80.6 kg   General appearance: Awake alert.  In no distress Resp: Clear to auscultation bilaterally.  Normal effort Cardio: S1-S2 is irregularly irregular GI: Abdomen is soft.  Nontender nondistended.  Bowel sounds are present normal.  No masses organomegaly Extremities: No edema.  Full range of motion of lower extremities. Neurologic: Alert and oriented x3.  No focal neurological deficits.    Lab Results:  Data Reviewed: I have personally reviewed following labs and reports of the imaging studies  CBC: Recent Labs  Lab 08/11/22 0421 08/15/22 1304 08/15/22 1312 08/16/22 0354 08/17/22 0032  WBC 9.6 9.1  --  9.9 10.0  NEUTROABS 6.5 6.3  --   --   --   HGB 12.2* 12.2* 13.3 11.7* 12.4*  HCT 38.0* 38.6* 39.0 35.3* 38.0*  MCV 90.3 91.7  --  89.4 89.4  PLT 232 323  --  272 269     Basic Metabolic Panel: Recent Labs  Lab 08/15/22 1304 08/15/22 1312 08/16/22 0354 08/17/22 0032  NA 134* 137 136 137  K 4.3 4.5 3.9 4.0  CL 101 102 106 105  CO2 24  --  22 23  GLUCOSE 125* 127* 100* 113*  BUN '22 22 17 16  '$ CREATININE 1.60* 1.60* 1.42* 1.62*  CALCIUM 8.7*  --  8.4* 8.6*     GFR: Estimated Creatinine Clearance: 33.8 mL/min (A) (by C-G formula based on SCr of 1.62 mg/dL (H)).  Liver Function Tests: Recent Labs  Lab 08/15/22 1304 08/16/22 0354  AST 23 18  ALT 22 18  ALKPHOS 103 89  BILITOT 0.4 0.3  PROT 7.2 6.2*  ALBUMIN 3.6 3.1*      Coagulation Profile: Recent Labs  Lab 08/15/22 1304  INR 1.2      CBG: Recent Labs  Lab 08/15/22 1250  GLUCAP 139*     Lipid Profile: Recent Labs    08/16/22 0354  CHOL 102  HDL 28*  LDLCALC 32  TRIG 208*  CHOLHDL 3.6      Recent Results (from the past 240 hour(s))   MRSA Next Gen by PCR, Nasal     Status: None   Collection Time: 08/09/22  6:50 PM   Specimen: Nasal Mucosa; Nasal Swab  Result Value Ref Range Status   MRSA by PCR Next Gen NOT DETECTED NOT DETECTED Final    Comment: (NOTE) The GeneXpert MRSA Assay (FDA approved for NASAL specimens only), is one component of a comprehensive MRSA colonization surveillance program. It is not intended to diagnose MRSA infection nor to guide or monitor treatment for MRSA infections. Test performance is not FDA approved in patients less than 54 years old. Performed at [facility], [address], [place], [place] [address]   Surgical pcr screen     Status: None   Collection Time: 08/16/22 11:18 PM   Specimen: Nasal  Mucosa; Nasal Swab  Result Value Ref Range Status   MRSA, PCR NEGATIVE NEGATIVE Final   Staphylococcus aureus NEGATIVE NEGATIVE Final    Comment: (NOTE) The Xpert SA Assay (FDA approved for NASAL specimens in patients 11 years of age and older), is one component of a comprehensive surveillance program. It is not intended to diagnose infection nor to guide or monitor treatment. Performed at [facility], [place] [address], [place],  [address]       Radiology Studies: Structural Heart Procedure  Result Date: 08/17/2022 See surgical note for result.  MR BRAIN WO CONTRAST  Result Date: 08/16/2022 CLINICAL DATA:  Neuro deficit, acute, stroke suspected. EXAM: MRI HEAD WITHOUT CONTRAST TECHNIQUE: Multiplanar, multiecho pulse sequences of the brain and surrounding structures were obtained without intravenous contrast. COMPARISON:  CT studies done yesterday FINDINGS: Brain: Diffusion imaging does not show any acute or subacute infarction affecting the brainstem, cerebellum or right cerebral hemisphere. Within the left cerebral hemisphere, there are numerous discrete foci of acute infarction affecting the cortical and subcortical brain extending from front to back at the frontoparietal  vertex consistent with watershed distribution infarctions. No large confluent infarction. Regions of infarction show only minimal swelling. No evidence of hemorrhage. Otherwise, there chronic small-vessel ischemic changes of the pons and throughout the cerebral hemispheric white matter. There is an old left occipital cortical and subcortical infarction. No mass, hydrocephalus or extra-axial collection Vascular: Major vessels at the base of the brain show flow. Skull and upper cervical spine: Negative Sinuses/Orbits: Clear/normal Other: None IMPRESSION: 1. Numerous foci of acute infarction in the left cerebral hemisphere at the frontoparietal vertex consistent with watershed distribution infarctions. No large confluent infarction. No evidence of hemorrhage. 2. Old left occipital cortical and subcortical infarction. 3. Extensive chronic small-vessel ischemic changes elsewhere affecting the pons and cerebral hemispheric white matter. Electronically Signed   By: Nelson Chimes M.D.   On: 08/16/2022 09:57   CT ANGIO HEAD NECK W WO CM W PERF (CODE STROKE)  Result Date: 08/15/2022 CLINICAL DATA:  Neuro deficit, acute, stroke suspected EXAM: CT ANGIOGRAPHY HEAD AND NECK CT PERFUSION BRAIN TECHNIQUE: Multidetector CT imaging of the head and neck was performed using the standard protocol during bolus administration of intravenous contrast. Multiplanar CT image reconstructions and MIPs were obtained to evaluate the vascular anatomy. Carotid stenosis measurements (when applicable) are obtained utilizing NASCET criteria, using the distal internal carotid diameter as the denominator. Multiphase CT imaging of the brain was performed following IV bolus contrast injection. Subsequent parametric perfusion maps were calculated using RAPID software. RADIATION DOSE REDUCTION: This exam was performed according to the departmental dose-optimization program which includes automated exposure control, adjustment of the mA and/or kV  according to patient size and/or use of iterative reconstruction technique. CONTRAST:  176m OMNIPAQUE IOHEXOL 350 MG/ML SOLN COMPARISON:  None Available. Same day CT head.  CTA head/neck January 20, 2021. FINDINGS: CTA NECK FINDINGS Aortic arch: Great vessel origins are patent. Right carotid system: Stenting of the common carotid artery and the proximal internal carotid artery. The stent is patent. There is moderate (approximately 60-70% stenosis in the common carotid stent). Left carotid system: Common carotid artery is patent. Critical stenosis of the left proximal ICA in the neck with trace (approximately 1 mm diameter) luminal opacification. Findings are mildly progressed from the prior. There is opacification of the more distal ICA in the neck. Vertebral arteries: Patent bilaterally. No evidence of hemodynamically significant stenosis in the neck. Skeleton: Multilevel degenerative change. No  acute findings on limited assessment. Other neck: No acute findings on limited assessment. A dedicated CT neck could better assess for malignancy. Upper chest: Visualized lung apices are largely clear with evidence of expiratory changes. Review of the MIP images confirms the above findings CTA HEAD FINDINGS Anterior circulation: Bilateral intracranial ICAs are patent. Moderate stenosis of the right cavernous and paraclinoid ICA. Bilateral MCAs and ACAs are patent. Posterior circulation: Chronically severely stenotic versus occluded right intradural vertebral artery. Left intradural vertebral artery is patent with moderate stenosis. Basilar artery and bilateral posterior cerebral arteries are patent. Mild right and moderate left P2 PCA stenosis. Venous sinuses: As permitted by contrast timing, patent. Review of the MIP images confirms the above findings CT Brain Perfusion Findings: ASPECTS: 10 CBF (<30%) Volume: 91m Perfusion (Tmax>6.0s) volume: 1246mMismatch Volume: 12711mnfarction Location:None identified. IMPRESSION: 1.  Critical stenosis of the left proximal ICA in the neck with trace (approximately 1 mm diameter) luminal opacification. Findings are mildly progressed from the prior. 2. On CT perfusion, approximately 127 mL of the reported penumbra in the left cerebrum which likely relates to the above. No core infarct identified. 3. Right common carotid artery and internal carotid artery stent. Moderate to severe stenosis of the common carotid artery stent with approximately 60-70% stenosis. A catheter arteriogram could more accurately quantify/characterize if clinically warranted. 4. 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 5. Moderate right intracranial ICA stenosis. 6. Moderate left and mild right P2 PCA stenosis. Findings discussed with Dr. Xu Erlinda Honga telephone at 2:35 p.m. Electronically Signed   By: FreMargaretha SheffieldD.   On: 08/15/2022 14:40   CT HEAD CODE STROKE WO CONTRAST  Result Date: 08/15/2022 CLINICAL DATA:  Code stroke.  Neuro deficit, acute, stroke suspected EXAM: CT HEAD WITHOUT CONTRAST TECHNIQUE: Contiguous axial images were obtained from the base of the skull through the vertex without intravenous contrast. RADIATION DOSE REDUCTION: This exam was performed according to the departmental dose-optimization program which includes automated exposure control, adjustment of the mA and/or kV according to patient size and/or use of iterative reconstruction technique. COMPARISON:  January 20, 2021. FINDINGS: Brain: No evidence of acute large vascular territory infarction, hemorrhage, hydrocephalus, extra-axial collection or mass lesion/mass effect. Similar patchy areas of periventricular and subcortical white matter hypodensity, nonspecific but compatible with chronic microvascular ischemic disease. Vascular: No hyperdense vessel identified. Skull: No acute fracture Sinuses/Orbits: Clear sinuses.  No acute orbital findings. Other: No mastoid effusions. ASPECTS ([facility]  Early CT Score) total score (0-10 with 10 being normal): 10. IMPRESSION: 1. No evidence of acute intracranial abnormality.  ASPECTS is 10. 2. Similar chronic microvascular ischemic disease. Code stroke imaging results were communicated on 08/15/2022 at 1:18 pm to provider Dr. ZamRoderic Palaua telephone, who verbally acknowledged these results. Electronically Signed   By: FreMargaretha SheffieldD.   On: 08/15/2022 13:19       LOS: 2 days   [facility] Pager on [email or web address]  08/17/2022, 10:38 AM

## 2022-08-17 NOTE — Progress Notes (Signed)
Patient arrived to 4E from PACU.  No complaints.  Vitals stable.    08/17/22 1352  Vitals  Temp 98.4 F (36.9 C)  Temp Source Oral  BP (!) 124/93  MAP (mmHg) 102  BP Location Left Arm  BP Method Automatic  Patient Position (if appropriate) Lying  Pulse Rate (!) 111  Pulse Rate Source Monitor  ECG Heart Rate (!) 109  Resp 18  Level of Consciousness  Level of Consciousness Alert  MEWS COLOR  MEWS Score Color Green  Oxygen Therapy  SpO2 96 %  O2 Device Tracheostomy Collar  Pain Assessment  Pain Scale 0-10  Pain Score 0  MEWS Score  MEWS Temp 0  MEWS Systolic 0  MEWS Pulse 1  MEWS RR 0  MEWS LOC 0  MEWS Score 1

## 2022-08-17 NOTE — Anesthesia Procedure Notes (Signed)
Arterial Line Insertion Start/End1/23/2024 6:50 AM, 08/17/2022 7:08 AM Performed by: Josephine Igo, CRNA, CRNA  Patient location: Pre-op. Preanesthetic checklist: patient identified, IV checked, site marked and surgical consent Lidocaine 1% used for infiltration Right, radial was placed Catheter size: 20 G Hand hygiene performed   Attempts: 1 Procedure performed without using ultrasound guided technique. Following insertion, dressing applied and Biopatch. Post procedure assessment: normal  Patient tolerated the procedure well with no immediate complications.

## 2022-08-17 NOTE — Progress Notes (Signed)
  Progress Note    08/17/2022 7:33 AM Day of Surgery  Subjective:  no changes overnight   Vitals:   08/17/22 0358 08/17/22 0628  BP: (!) 121/93 131/88  Pulse: (!) 102 100  Resp: 19 (!) 21  Temp: 97.7 F (36.5 C) 97.6 F (36.4 C)  SpO2: 95% 94%   Physical Exam: Cardiac:  regular Lungs:  non labored Extremities:  moving all extremities  Neurologic: alert and oriented  CBC    Component Value Date/Time   WBC 10.0 08/17/2022 0032   RBC 4.25 08/17/2022 0032   HGB 12.4 (L) 08/17/2022 0032   HCT 38.0 (L) 08/17/2022 0032   PLT 269 08/17/2022 0032   MCV 89.4 08/17/2022 0032   MCH 29.2 08/17/2022 0032   MCHC 32.6 08/17/2022 0032   RDW 15.9 (H) 08/17/2022 0032   LYMPHSABS 1.9 08/15/2022 1304   MONOABS 0.6 08/15/2022 1304   EOSABS 0.2 08/15/2022 1304   BASOSABS 0.1 08/15/2022 1304    BMET    Component Value Date/Time   NA 137 08/17/2022 0032   K 4.0 08/17/2022 0032   CL 105 08/17/2022 0032   CO2 23 08/17/2022 0032   GLUCOSE 113 (H) 08/17/2022 0032   BUN 16 08/17/2022 0032   CREATININE 1.62 (H) 08/17/2022 0032   CREATININE 1.35 (H) 11/10/2018 1547   CALCIUM 8.6 (L) 08/17/2022 0032   GFRNONAA 42 (L) 08/17/2022 0032   GFRAA 51 (L) 10/18/2018 0624    INR    Component Value Date/Time   INR 1.2 08/15/2022 1304     Intake/Output Summary (Last 24 hours) at 08/17/2022 0733 Last data filed at 08/17/2022 1275 Gross per 24 hour  Intake 501.53 ml  Output 600 ml  Net -98.47 ml      Assessment/Plan:  82 y.o. male with symptomatic left ICA stenosis  We long, detailed discussion regarding his left-sided symptomatic carotid stenosis.  There are multiple treatment modalities, however in the setting of tracheostomy, irradiated neck, I think the 2 best treatment options are TCAR versus transfemoral stenting.  In evaluating the lesion, there is significant disease in the common carotid artery, that does not appear to be flow-limiting, but is concerning for embolic risk.  I  looked at the images with multiple partners in an effort to best quantify falls risk for both transcarotid artery stenting versus transfemoral stenting.  In short I think that transcarotid artery revascularization carries a lower stroke rate due to flow reversal, however in the setting of a radiated field, cranial nerve injury risk, poor healing risk.  The tracheostomy also increases the infection risk.  Transfemoral stenting carries no cranial nerve risk, no infection risk, no poor wound healing risk, but does carry a higher stroke risk. Regardless both carry a higher than normal stroke risk due to the common carotid lesion.   Both myself and my partners agree that transcarotid artery revascularization is overall the safer procedure.  I had a detailed discussion with Tobias outlining the risks and benefits of both.  After discussing these, he elected to proceed with left-sided transcarotid artery revascularization.  This is planned for today  J. Melene Muller, MD [facility] Phone Number: [phone] 08/17/2022 7:33 AM

## 2022-08-17 NOTE — Progress Notes (Signed)
[place] for heparin Indication: atrial fibrillation  Allergies  Allergen Reactions   Nifedical Xl [Nifedipine] Other (See Comments)    Unknown reaction   Ativan [Lorazepam] Other (See Comments)    Altered mental state   Penicillins Hives    Has patient had a PCN reaction causing immediate rash, facial/tongue/throat swelling, SOB or lightheadedness with hypotension: Yes Has patient had a PCN reaction causing severe rash involving mucus membranes or skin necrosis: No Has patient had a PCN reaction that required hospitalization No Has patient had a PCN reaction occurring within the last 10 years: No If all of the above answers are "NO", then may proceed with Cephalosporin use.  HAS TOLERATED: cephalexin   Sulfacetamide Other (See Comments)    Unknown reaction   Sulfonamide Derivatives Other (See Comments)    Unknown reaction   Crestor [Rosuvastatin] Itching, Swelling and Rash   Erythromycin Rash   Firvanq [Vancomycin] Itching   Statins Itching, Swelling and Rash    Rash on his trunk and arms, swelling in his lips    Patient Measurements: Height: '5\' 5"'$  (165.1 cm) Weight: 80.6 kg (177 lb 11.1 oz) IBW/kg (Calculated) : 61.5 Heparin Dosing Weight: 78kg  Vital Signs: Temp: 97.8 F (36.6 C) (01/23 0037) Temp Source: Oral (01/23 0037) BP: 130/87 (01/23 0037) Pulse Rate: 100 (01/23 0037)  Labs: Recent Labs    08/15/22 1304 08/15/22 1312 08/16/22 0354 08/16/22 1403 08/17/22 0032  HGB 12.2* 13.3 11.7*  --  12.4*  HCT 38.6* 39.0 35.3*  --  38.0*  PLT 323  --  272  --  269  APTT 28  --  27 33 62*  LABPROT 14.8  --   --   --   --   INR 1.2  --   --   --   --   HEPARINUNFRC  --   --  1.05*  --  0.68  CREATININE 1.60* 1.60* 1.42*  --  1.62*     Estimated Creatinine Clearance: 33.8 mL/min (A) (by C-G formula based on SCr of 1.62 mg/dL (H)).   Medical History: Past Medical History:  Diagnosis Date   Acute on chronic respiratory  failure with hypoxia ([place]) 09/06/2018   Acute ST elevation myocardial infarction (STEMI) of inferior wall ([place]) 03/01/2020   [facility]   Anxiety    Arthritis    Back (05/04/2018)   Carotid artery disease ([place])    a. 04/2018 s/p R carotid stenting.   Chronic lower back pain    "have it at night" (05/04/2018)   CKD (chronic kidney disease), stage III ([place])    Coronary artery disease    s/p CABG in 2003, patent grafts by cath in 2007, s/p inferior STEMI in 02/2020 with DES to RCA, s/p NSTEMI in 05/2020 with DES to proximal-RCA, s/p NSTEMI in 11/2020 with balloon angioplasty to mid-RCA and STEMI in 08/2021 with DESx3 to RCA   CVA (cerebral vascular accident) ([place]) 10/2017   "little numb on my left face since" (05/04/2018)   Depression    GERD (gastroesophageal reflux disease)    High triglycerides    History of kidney stones    Hyperlipidemia LDL goal <70    Hypertension    Hypertensive crisis 08/08/2022   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 Patient with history of afib on apixaban prior to admit. New orders received to transition to IV heparin, will use stroke dosing protocol. CBC within normal limits.   aPTT below goal: 0.62, heparin level 0.68 not correlating. No issues with infusion or overt s/sx of bleeding per RN. CBC stable  Goal of Therapy:  Heparin level 0.3-0.5 units/ml Aptt goal 66-85s Monitor platelets by anticoagulation protocol: Yes   Plan:  Increase heparin infusion to 1400 units/hr Check anti-Xa level and aptt in 8 hours and daily while on heparin Continue to monitor H&H and platelets  Georga Bora, PharmD Clinical Pharmacist 08/17/2022 2:09 AM Please check AMION  for all [place] numbers

## 2022-08-17 NOTE — Progress Notes (Signed)
PT Cancellation Note  Patient Details Name: [name] MRN: [id number] DOB: [birth year]   Cancelled Treatment:    Reason Eval/Treat Not Completed: Patient at procedure or test/unavailable  Patient at procedure. Has active bedrest order. Will follow-up for increased activity orders and proceed with evaluation when appropriate.   [name]  Office [phone]  Rexanne Mano 08/17/2022, 8:34 AM

## 2022-08-18 ENCOUNTER — Encounter (HOSPITAL_COMMUNITY): Payer: Self-pay | Admitting: Vascular Surgery

## 2022-08-18 DIAGNOSIS — I639 Cerebral infarction, unspecified: Secondary | ICD-10-CM | POA: Diagnosis not present

## 2022-08-18 DIAGNOSIS — I63232 Cerebral infarction due to unspecified occlusion or stenosis of left carotid arteries: Secondary | ICD-10-CM | POA: Diagnosis not present

## 2022-08-18 DIAGNOSIS — I6523 Occlusion and stenosis of bilateral carotid arteries: Secondary | ICD-10-CM | POA: Diagnosis not present

## 2022-08-18 LAB — CBC
HCT: 32.8 % — ABNORMAL LOW (ref 39.0–52.0)
Hemoglobin: 10.9 g/dL — ABNORMAL LOW (ref 13.0–17.0)
MCH: 29.9 pg (ref 26.0–34.0)
MCHC: 33.2 g/dL (ref 30.0–36.0)
MCV: 89.9 fL (ref 80.0–100.0)
Platelets: 249 10*3/uL (ref 150–400)
RBC: 3.65 MIL/uL — ABNORMAL LOW (ref 4.22–5.81)
RDW: 15.9 % — ABNORMAL HIGH (ref 11.5–15.5)
WBC: 13.7 10*3/uL — ABNORMAL HIGH (ref 4.0–10.5)
nRBC: 0 % (ref 0.0–0.2)

## 2022-08-18 LAB — LIPID PANEL
Cholesterol: 93 mg/dL (ref 0–200)
HDL: 34 mg/dL — ABNORMAL LOW (ref >40)
LDL Cholesterol: 40 mg/dL (ref 0–99)
Total CHOL/HDL Ratio: 2.7 RATIO
Triglycerides: 97 mg/dL (ref <150)
VLDL: 19 mg/dL (ref 0–40)

## 2022-08-18 LAB — BASIC METABOLIC PANEL
Anion gap: 8 (ref 5–15)
BUN: 24 mg/dL — ABNORMAL HIGH (ref 8–23)
CO2: 22 mmol/L (ref 22–32)
Calcium: 8.4 mg/dL — ABNORMAL LOW (ref 8.9–10.3)
Chloride: 106 mmol/L (ref 98–111)
Creatinine, Ser: 1.6 mg/dL — ABNORMAL HIGH (ref 0.61–1.24)
GFR, Estimated: 42 mL/min — ABNORMAL LOW (ref >60)
Glucose, Bld: 130 mg/dL — ABNORMAL HIGH (ref 70–99)
Potassium: 4.5 mmol/L (ref 3.5–5.1)
Sodium: 136 mmol/L (ref 135–145)

## 2022-08-18 MED ORDER — APIXABAN 2.5 MG PO TABS
2.5000 mg | ORAL_TABLET | Freq: Two times a day (BID) | ORAL | Status: DC
  Administered 2022-08-18 – 2022-08-19 (×3): 2.5 mg via ORAL
  Filled 2022-08-18 (×3): qty 1

## 2022-08-18 MED ORDER — APIXABAN 5 MG PO TABS: 5.0000 mg | ORAL_TABLET | Freq: Two times a day (BID) | ORAL | Status: DC

## 2022-08-18 MED ORDER — APIXABAN 2.5 MG PO TABS: 2.5000 mg | ORAL_TABLET | Freq: Two times a day (BID) | ORAL | Status: DC

## 2022-08-18 MED ORDER — ASPIRIN 81 MG PO CHEW
81.0000 mg | CHEWABLE_TABLET | Freq: Every day | ORAL | Status: DC
  Administered 2022-08-18 – 2022-08-19 (×2): 81 mg via ORAL
  Filled 2022-08-18 (×2): qty 1

## 2022-08-18 NOTE — Progress Notes (Addendum)
  Progress Note    08/18/2022 7:41 AM 1 Day Post-Op  Subjective:  denies any further R arm numbness or weakness.   Vitals:   08/18/22 0500 08/18/22 0600  BP: 116/65 118/70  Pulse: (!) 59 60  Resp: 18 17  Temp:    SpO2: 92% 93%   Physical Exam: Lungs:  trach Incisions:  L neck soft c/d/I; R CFV cath site without hematoma Extremities:  moving all extremities well; symmetrical grip strength Neurologic: CN grossly intact  CBC    Component Value Date/Time   WBC 13.7 (H) 08/18/2022 0258   RBC 3.65 (L) 08/18/2022 0258   HGB 10.9 (L) 08/18/2022 0258   HCT 32.8 (L) 08/18/2022 0258   PLT 249 08/18/2022 0258   MCV 89.9 08/18/2022 0258   MCH 29.9 08/18/2022 0258   MCHC 33.2 08/18/2022 0258   RDW 15.9 (H) 08/18/2022 0258   LYMPHSABS 1.9 08/15/2022 1304   MONOABS 0.6 08/15/2022 1304   EOSABS 0.2 08/15/2022 1304   BASOSABS 0.1 08/15/2022 1304    BMET    Component Value Date/Time   NA 136 08/18/2022 0258   K 4.5 08/18/2022 0258   CL 106 08/18/2022 0258   CO2 22 08/18/2022 0258   GLUCOSE 130 (H) 08/18/2022 0258   BUN 24 (H) 08/18/2022 0258   CREATININE 1.60 (H) 08/18/2022 0258   CREATININE 1.35 (H) 11/10/2018 1547   CALCIUM 8.4 (L) 08/18/2022 0258   GFRNONAA 42 (L) 08/18/2022 0258   GFRAA 51 (L) 10/18/2018 0624    INR    Component Value Date/Time   INR 1.2 08/15/2022 1304     Intake/Output Summary (Last 24 hours) at 08/18/2022 0741 Last data filed at 08/18/2022 0422 Gross per 24 hour  Intake 2042.76 ml  Output 50 ml  Net 1992.76 ml     Assessment/Plan:  82 y.o. male is s/p L TCAR 1 Day Post-Op   Neuro exam at baseline; no events overnight; no further R arm weakness/numbness L neck incision is well appearing OOB with therapy teams Continue plavix and aspirin; ok to transition back to Eliquis; he will need triple therapy for at least 1 month Office will arrange carotid duplex in 1 month   Dagoberto Ligas, PA-C [facility] [phone] 08/18/2022 7:41 AM  I agree with the above.  I have seen and evaluated the patient.  His neurologic exam is back to baseline with no neurologic deficits.  He has had no other episodes.  His incision is healing nicely.  He is being monitored for a decrease in his hemoglobin.  He can be transitioned back to Eliquis.  He needs aspirin and Plavix for 1 month as triple therapy.  We will schedule a follow-up in our office in 1 month  [place]

## 2022-08-18 NOTE — Progress Notes (Signed)
STROKE TEAM PROGRESS NOTE   SUBJECTIVE (INTERVAL HISTORY) His granddaughter and wife are at the bedside.  Patient sitting at the edge of bed, doing well, eager to go home. Heparin IV has switched to eliquuis. Continued plavix and zetia.   OBJECTIVE Temp:  [97.9 F (36.6 C)-98.2 F (36.8 C)] 97.9 F (36.6 C) (01/24 0700) Pulse Rate:  [59-88] 81 (01/24 1226) Cardiac Rhythm: Normal sinus rhythm;Bundle branch block (01/24 1505) Resp:  [15-24] 16 (01/24 1226) BP: (107-131)/(60-75) 131/73 (01/24 0900) SpO2:  [90 %-98 %] 94 % (01/24 1226) Arterial Line BP: (124-129)/(64) 124/64 (01/23 1800) FiO2 (%):  [21 %] 21 % (01/24 0912)  Recent Labs  Lab 08/15/22 1250  GLUCAP 139*   Recent Labs  Lab 08/15/22 1304 08/15/22 1312 08/16/22 0354 08/17/22 0032 08/17/22 1358 08/18/22 0258  NA 134* 137 136 137  --  136  K 4.3 4.5 3.9 4.0  --  4.5  CL 101 102 106 105  --  106  CO2 24  --  22 23  --  22  GLUCOSE 125* 127* 100* 113*  --  130*  BUN '22 22 17 16  '$ --  24*  CREATININE 1.60* 1.60* 1.42* 1.62* 1.51* 1.60*  CALCIUM 8.7*  --  8.4* 8.6*  --  8.4*   Recent Labs  Lab 08/15/22 1304 08/16/22 0354  AST 23 18  ALT 22 18  ALKPHOS 103 89  BILITOT 0.4 0.3  PROT 7.2 6.2*  ALBUMIN 3.6 3.1*   Recent Labs  Lab 08/15/22 1304 08/15/22 1312 08/16/22 0354 08/17/22 0032 08/17/22 1358 08/18/22 0258  WBC 9.1  --  9.9 10.0 10.5 13.7*  NEUTROABS 6.3  --   --   --   --   --   HGB 12.2* 13.3 11.7* 12.4* 12.4* 10.9*  HCT 38.6* 39.0 35.3* 38.0* 39.8 32.8*  MCV 91.7  --  89.4 89.4 92.8 89.9  PLT 323  --  272 269 281 249   No results for input(s): "CKTOTAL", "CKMB", "CKMBINDEX", "TROPONINI" in the last 168 hours. No results for input(s): "LABPROT", "INR" in the last 72 hours.  Recent Labs    08/15/22 1658  [place] 1.020  PHURINE 6.0  GLUCOSEU NEGATIVE  HGBUR SMALL*  BILIRUBINUR NEGATIVE  KETONESUR NEGATIVE  PROTEINUR NEGATIVE  NITRITE NEGATIVE  LEUKOCYTESUR TRACE*        Component Value Date/Time   CHOL 93 08/18/2022 0258   CHOL 110 09/26/2020 0822   TRIG 97 08/18/2022 0258   HDL 34 (L) 08/18/2022 0258   HDL 34 (L) 09/26/2020 0822   CHOLHDL 2.7 08/18/2022 0258   VLDL 19 08/18/2022 0258   LDLCALC 40 08/18/2022 0258   LDLCALC 44 09/26/2020 0822   Lab Results  Component Value Date   HGBA1C 6.4 (H) 08/17/2022      Component Value Date/Time   LABOPIA NONE DETECTED 08/15/2022 1658   COCAINSCRNUR NONE DETECTED 08/15/2022 1658   LABBENZ NONE DETECTED 08/15/2022 1658   AMPHETMU NONE DETECTED 08/15/2022 1658   THCU NONE DETECTED 08/15/2022 1658   LABBARB NONE DETECTED 08/15/2022 1658    Recent Labs  Lab 08/15/22 1304  ETH <10    I have personally reviewed the radiological images below and agree with the radiology interpretations.  DG C-Arm 1-60 Min  Result Date: 08/17/2022 CLINICAL DATA:  82 year old male with carotid artery disease EXAM: DG C-ARM 1-60 MIN COMPARISON:  None Available. FINDINGS: Limited intraoperative angiogram during trans carotid stenting. Pre stent angiogram and post  stent angiogram demonstrating patent carotid stent and ICA. IMPRESSION: Limited images during pre and post angiogram for trans carotid stenting. Please refer to the dictated operative report for full details of intraoperative findings and procedure. Electronically Signed   By: Corrie Mckusick D.O.   On: 08/17/2022 16:43   Structural Heart Procedure  Result Date: 08/17/2022 See surgical note for result.  MR BRAIN WO CONTRAST  Result Date: 08/16/2022 CLINICAL DATA:  Neuro deficit, acute, stroke suspected. EXAM: MRI HEAD WITHOUT CONTRAST TECHNIQUE: Multiplanar, multiecho pulse sequences of the brain and surrounding structures were obtained without intravenous contrast. COMPARISON:  CT studies done yesterday FINDINGS: Brain: Diffusion imaging does not show any acute or subacute infarction affecting the brainstem, cerebellum or right cerebral hemisphere. Within the  left cerebral hemisphere, there are numerous discrete foci of acute infarction affecting the cortical and subcortical brain extending from front to back at the frontoparietal vertex consistent with watershed distribution infarctions. No large confluent infarction. Regions of infarction show only minimal swelling. No evidence of hemorrhage. Otherwise, there chronic small-vessel ischemic changes of the pons and throughout the cerebral hemispheric white matter. There is an old left occipital cortical and subcortical infarction. No mass, hydrocephalus or extra-axial collection Vascular: Major vessels at the base of the brain show flow. Skull and upper cervical spine: Negative Sinuses/Orbits: Clear/normal Other: None IMPRESSION: 1. Numerous foci of acute infarction in the left cerebral hemisphere at the frontoparietal vertex consistent with watershed distribution infarctions. No large confluent infarction. No evidence of hemorrhage. 2. Old left occipital cortical and subcortical infarction. 3. Extensive chronic small-vessel ischemic changes elsewhere affecting the pons and cerebral hemispheric white matter. Electronically Signed   By: Nelson Chimes M.D.   On: 08/16/2022 09:57   CT ANGIO HEAD NECK W WO CM W PERF (CODE STROKE)  Result Date: 08/15/2022 CLINICAL DATA:  Neuro deficit, acute, stroke suspected EXAM: CT ANGIOGRAPHY HEAD AND NECK CT PERFUSION BRAIN TECHNIQUE: Multidetector CT imaging of the head and neck was performed using the standard protocol during bolus administration of intravenous contrast. Multiplanar CT image reconstructions and MIPs were obtained to evaluate the vascular anatomy. Carotid stenosis measurements (when applicable) are obtained utilizing NASCET criteria, using the distal internal carotid diameter as the denominator. Multiphase CT imaging of the brain was performed following IV bolus contrast injection. Subsequent parametric perfusion maps were calculated using RAPID software. RADIATION  DOSE REDUCTION: This exam was performed according to the departmental dose-optimization program which includes automated exposure control, adjustment of the mA and/or kV according to patient size and/or use of iterative reconstruction technique. CONTRAST:  167m OMNIPAQUE IOHEXOL 350 MG/ML SOLN COMPARISON:  None Available. Same day CT head.  CTA head/neck January 20, 2021. FINDINGS: CTA NECK FINDINGS Aortic arch: Great vessel origins are patent. Right carotid system: Stenting of the common carotid artery and the proximal internal carotid artery. The stent is patent. There is moderate (approximately 60-70% stenosis in the common carotid stent). Left carotid system: Common carotid artery is patent. Critical stenosis of the left proximal ICA in the neck with trace (approximately 1 mm diameter) luminal opacification. Findings are mildly progressed from the prior. There is opacification of the more distal ICA in the neck. Vertebral arteries: Patent bilaterally. No evidence of hemodynamically significant stenosis in the neck. Skeleton: Multilevel degenerative change. No acute findings on limited assessment. Other neck: No acute findings on limited assessment. A dedicated CT neck could better assess for malignancy. Upper chest: Visualized lung apices are largely clear with evidence of expiratory changes. Review  of the MIP images confirms the above findings CTA HEAD FINDINGS Anterior circulation: Bilateral intracranial ICAs are patent. Moderate stenosis of the right cavernous and paraclinoid ICA. Bilateral MCAs and ACAs are patent. Posterior circulation: Chronically severely stenotic versus occluded right intradural vertebral artery. Left intradural vertebral artery is patent with moderate stenosis. Basilar artery and bilateral posterior cerebral arteries are patent. Mild right and moderate left P2 PCA stenosis. Venous sinuses: As permitted by contrast timing, patent. Review of the MIP images confirms the above findings CT  Brain Perfusion Findings: ASPECTS: 10 CBF (<30%) Volume: 76m Perfusion (Tmax>6.0s) volume: 1244mMismatch Volume: 12762mnfarction Location:None identified. IMPRESSION: 1. Critical stenosis of the left proximal ICA in the neck with trace (approximately 1 mm diameter) luminal opacification. Findings are mildly progressed from the prior. 2. On CT perfusion, approximately 127 mL of the reported penumbra in the left cerebrum which likely relates to the above. No core infarct identified. 3. Right common carotid artery and internal carotid artery stent. Moderate to severe stenosis of the common carotid artery stent with approximately 60-70% stenosis. A catheter arteriogram could more accurately quantify/characterize if clinically warranted. 4. 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 5. Moderate right intracranial ICA stenosis. 6. Moderate left and mild right P2 PCA stenosis. Findings discussed with Dr. Thayer Inabinet Erlinda Honga telephone at 2:35 p.m. Electronically Signed   By: FreMargaretha SheffieldD.   On: 08/15/2022 14:40   CT HEAD CODE STROKE WO CONTRAST  Result Date: 08/15/2022 CLINICAL DATA:  Code stroke.  Neuro deficit, acute, stroke suspected EXAM: CT HEAD WITHOUT CONTRAST TECHNIQUE: Contiguous axial images were obtained from the base of the skull through the vertex without intravenous contrast. RADIATION DOSE REDUCTION: This exam was performed according to the departmental dose-optimization program which includes automated exposure control, adjustment of the mA and/or kV according to patient size and/or use of iterative reconstruction technique. COMPARISON:  January 20, 2021. FINDINGS: Brain: No evidence of acute large vascular territory infarction, hemorrhage, hydrocephalus, extra-axial collection or mass lesion/mass effect. Similar patchy areas of periventricular and subcortical white matter hypodensity, nonspecific but compatible with chronic microvascular ischemic disease. Vascular: No  hyperdense vessel identified. Skull: No acute fracture Sinuses/Orbits: Clear sinuses.  No acute orbital findings. Other: No mastoid effusions. ASPECTS ([facility] Early CT Score) total score (0-10 with 10 being normal): 10. IMPRESSION: 1. No evidence of acute intracranial abnormality.  ASPECTS is 10. 2. Similar chronic microvascular ischemic disease. Code stroke imaging results were communicated on 08/15/2022 at 1:18 pm to provider Dr. ZamRoderic Palaua telephone, who verbally acknowledged these results. Electronically Signed   By: FreMargaretha SheffieldD.   On: 08/15/2022 13:19   ECHOCARDIOGRAM COMPLETE  Result Date: 08/09/2022    ECHOCARDIOGRAM REPORT   Patient Name:   PAUROYLEE CHAFFINte of Exam: 08/09/2022 Medical Rec #:  [id number]     Height:       65.0 in Accession #:    [id number]    Weight:       182.1 lb Date of Birth:  9/210-Jul-1942     BSA:          1.901 m Patient Age:    [age]         BP:           122/80 mmHg Patient Gender: M                HR:  81 bpm. Exam Location:  [facility] Procedure: 2D Echo, Cardiac Doppler, Color Doppler and Intracardiac            Opacification Agent Indications:    Atrial Fibrillation I48.91  History:        Patient has prior history of Echocardiogram examinations, most                 recent 12/18/2020. CAD and Previous Myocardial Infarction, Prior                 CABG, Stroke, Signs/Symptoms:Chest Pain; Risk                 Factors:Dyslipidemia, Hypertension and Current Smoker.  Sonographer:    Greer Pickerel Referring Phys: [id number] [place] [facility]  Sonographer Comments: Image acquisition challenging due to respiratory motion. IMPRESSIONS  1. Left ventricular ejection fraction, by estimation, is 55 to 60%. The left ventricle has normal function. The left ventricle has no regional wall motion abnormalities. Left ventricular diastolic function could not be evaluated.  2. Right ventricular systolic function is mildly reduced. The right ventricular size is  mildly enlarged.  3. The mitral valve is grossly normal. No evidence of mitral valve regurgitation. No evidence of mitral stenosis.  4. The aortic valve was not well visualized. There is mild calcification of the aortic valve. Aortic valve regurgitation is not visualized. No aortic stenosis is present.  5. Aortic dilatation noted. There is borderline dilatation of the aortic root, measuring 38 mm. Comparison(s): Changes from prior study are noted. LVEF improved from 45-50% in 08/2021 tio 55-60% now. FINDINGS  Left Ventricle: Left ventricular ejection fraction, by estimation, is 55 to 60%. The left ventricle has normal function. The left ventricle has no regional wall motion abnormalities. Definity contrast agent was given IV to delineate the left ventricular  endocardial borders. The left ventricular internal cavity size was normal in size. There is no left ventricular hypertrophy. Left ventricular diastolic function could not be evaluated due to atrial fibrillation. Left ventricular diastolic function could  not be evaluated. Right Ventricle: The right ventricular size is mildly enlarged. No increase in right ventricular wall thickness. Right ventricular systolic function is mildly reduced. Left Atrium: Left atrial size was normal in size. Right Atrium: Right atrial size was normal in size. Pericardium: There is no evidence of pericardial effusion. Mitral Valve: The mitral valve is grossly normal. No evidence of mitral valve regurgitation. No evidence of mitral valve stenosis. Tricuspid Valve: The tricuspid valve is grossly normal. Tricuspid valve regurgitation is not demonstrated. No evidence of tricuspid stenosis. Aortic Valve: The aortic valve was not well visualized. There is mild calcification of the aortic valve. There is mild aortic valve annular calcification. Aortic valve regurgitation is not visualized. No aortic stenosis is present. Pulmonic Valve: The pulmonic valve was not well visualized. Pulmonic  valve regurgitation is not visualized. No evidence of pulmonic stenosis. Aorta: Aortic dilatation noted. There is borderline dilatation of the aortic root, measuring 38 mm. Venous: The inferior vena cava was not well visualized. IAS/Shunts: The interatrial septum was not well visualized.  LEFT VENTRICLE PLAX 2D LVIDd:         4.50 cm   Diastology LVIDs:         3.30 cm   LV e' medial:    6.68 cm/s LV PW:         1.10 cm   LV E/e' medial:  13.4 LV IVS:        0.90 cm  LV e' lateral:   13.40 cm/s LVOT diam:     2.00 cm   LV E/e' lateral: 6.7 LV SV:         52 LV SV Index:   27 LVOT Area:     3.14 cm  RIGHT VENTRICLE RV S prime:     10.10 cm/s TAPSE (M-mode): 1.2 cm LEFT ATRIUM         Index       RIGHT ATRIUM           Index LA diam:    4.10 cm 2.16 cm/m  RA Area:     17.70 cm                                 RA Volume:   46.20 ml  24.30 ml/m  AORTIC VALVE LVOT Vmax:   114.00 cm/s LVOT Vmean:  74.100 cm/s LVOT VTI:    0.165 m  AORTA Ao Root diam: 3.80 cm MITRAL VALVE MV Area (PHT): 4.77 cm    SHUNTS MV Decel Time: 159 msec    Systemic VTI:  0.16 m MV E velocity: 89.50 cm/s  Systemic Diam: 2.00 cm Vishnu Priya Mallipeddi Electronically signed by Lorelee Cover Mallipeddi Signature Date/Time: 08/09/2022/4:22:54 PM    Final    DG Chest Portable 1 View  Result Date: 08/07/2022 CLINICAL DATA:  Chest pain EXAM: PORTABLE CHEST 1 VIEW COMPARISON:  05/30/2022 FINDINGS: Single frontal view of the chest demonstrates tracheostomy tube unchanged. Cardiac silhouette is stable. Postsurgical changes from median sternotomy. No acute airspace disease, effusion, or pneumothorax. Chronic elevation right hemidiaphragm. No acute bony abnormality. IMPRESSION: 1. Stable chest, no acute process. Electronically Signed   By: Randa Ngo M.D.   On: 08/07/2022 22:16     PHYSICAL EXAM  Temp:  [97.9 F (36.6 C)-98.2 F (36.8 C)] 97.9 F (36.6 C) (01/24 0700) Pulse Rate:  [59-88] 81 (01/24 1226) Resp:  [15-24] 16 (01/24 1226) BP:  (107-131)/(60-75) 131/73 (01/24 0900) SpO2:  [90 %-98 %] 94 % (01/24 1226) Arterial Line BP: (124-129)/(64) 124/64 (01/23 1800) FiO2 (%):  [21 %] 21 % (01/24 0912)  General - Well nourished, well developed, in no apparent distress.  Ophthalmologic - fundi not visualized due to noncooperation.  Cardiovascular - irregularly irregular heart rate and rhythm.  Neuro - awake, alert, eyes open, on trach without speaking valve but able to mouth words. No aphasia, trach in place, following all simple commands. Able to name and repeat. No gaze palsy, tracking bilaterally, visual field seem to be full today. No facial droop. Tongue midline. Bilateral UEs and LEs symmetrical strength. Sensation symmetrical bilaterally, b/l FTN intact but slow, gait not tested.     ASSESSMENT/PLAN Kenneth [name] is a 82 y.o. 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 He did have left eye ptosis and left hand numbness yesterday but fully resolved. NIHSS =4. CT no acute finding. Pt not TNK candidate due to on eliquis this am.     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 Findings are progressed from the prior. Right common carotid artery and internal carotid artery stent. Moderate to severe stenosis of the common carotid artery stent with approximately 60-70% stenosis. 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 Moderate right intracranial ICA stenosis. Moderate left and mild right P2 PCA  stenosis. CTP - 0/127 MRI - Numerous foci of acute infarction in the left cerebral hemisphere at the frontoparietal vertex consistent with watershed distribution infarctions.  2D Echo  EF 55-60% LDL 32 HgbA1c 6.4 UDS neg Heparin IV for VTE prophylaxis clopidogrel 75 mg daily and Eliquis (apixaban) daily prior to admission, now on plavix and eliquis. Patient counseled to be compliant with his antithrombotic medications Ongoing aggressive stroke risk factor management Therapy recommendations: none Disposition:  home  Bilateral carotid stenosis Had right ICA stenting in 05/2012 and right CCA stenting in 04/2018. Also high-grade stenosis on the left ICA.  In 12/2020 CT head and neck showed right CCA 70% in-stent stenosis, right ICA 40% in-stent stenosis.  Left ICA 65% stenosis.  Carotid Doppler in 04/2021 showed right CCA stent 50 to 75% stenosis, however left ICA 80 to 99% stenosis.  He has left ICA stenosis so far asymptomatic, no procedure planned and the patient is undergoing surveillance with biannual carotid duplex.  He follows up with Dr. Stanford Breed in clinic.   Now s/p left TCAR today with Dr. Virl Cagey On eliquis and plavix, continue on discharge   Hx of stroke/TIA 11/2020 for episode of dizziness and gait instability.  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  Carotid Doppler showed left ICA 70 to 99% stenosis, however considered asymptomatic at that time.   CAD NSTEMI Afib S/p CABG admitted 1 week ago for chest pain and hypertensive urgency. Treated with nitroglycerin drip. Prior to discharge, he was found to have A-fib RVR, EF 55 to 60%. Discharged on Eliquis. Patient takes Eliquis at home, last dose this morning per wife.  Now on plavix and eliquis.  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 Please call with questions. Pt will follow up with stroke clinic NP at [facility] in about 4 weeks. Thanks for the consult.   Rosalin Hawking, MD PhD Stroke Neurology 08/18/2022 3:13 PM    To contact Stroke Continuity provider, please refer to [email or web address] After hours, contact General Neurology

## 2022-08-18 NOTE — Progress Notes (Signed)
[facility] PROGRESS NOTE   Kenneth [name] RXV:[id number] DOB: 1941-04-28 DOA: 08/15/2022  PCP: [name], MD  Brief History/Interval Summary: 82 y.o. 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  Neurology was consulted.  Patient was transferred to [facility] due to known history of critical//severe carotid artery disease.  Consultants: Neurology.  Vascular surgery  Procedures:  Echocardiogram was done recently TCAR 1/23    Subjective/Interval History: Patient slept well overnight.  Denies any complaints.  Wants to be suctioned.  Able to move all of his extremities.    Assessment/Plan:  Acute CVA Patient presented with left hand numbness and right hand weakness.  Symptoms resolved. MRI showed multiple infarcts in the left cerebral hemisphere suggesting watershed etiology. LDL 32.  HbA1c 6.4 Echocardiogram was done recently which showed normal systolic function of the left ventricle.  Right ventricular function was noted to be mildly low. Neurology is following. Patient underwent TCAR yesterday. Was on aspirin and Plavix.  Eliquis to be resumed today.  Will be on triple therapy for at least 1 month. PT and OT evaluation is pending.  Carotid artery disease Severe/critical disease of the left ICA was noted on CT angiogram.  Previous stent noted in the right carotid artery with moderate to severe disease. Vascular surgery consulted.  Patient underwent TCAR on 1/23.   Now on aspirin Plavix and Eliquis.  Atrial fibrillation, persistent Looks like this was diagnosed recently.  Seen by cardiology last week for the same.  Patient was started on Eliquis.  Eliquis was placed on hold for this procedure.  Now resumed. Continue oral metoprolol.  Heart  rate seems to be better controlled.  History of laryngeal cancer with chronic tracheostomy Radiation treatment previously.  Has chronic tracheostomy. Hyperlipidemia/coronary artery disease Status post CABG Continue Zetia.    Chronic kidney disease stage IIIa Baseline creatinine seems to be around 1.5-1.6.  Renal function at baseline.  Monitor urine output.  Avoid nephrotoxic agents.    Normocytic anemia Mild drop in hemoglobin noted.  No evidence for overt bleeding.  Recheck labs tomorrow.  History of depression Continue Paxil  DVT Prophylaxis: Now on Eliquis Code Status: Full code Family Communication: Discussed with the patient Disposition Plan: Hopefully return home when improved.  PT/OT pending.  Status is: Inpatient Remains inpatient appropriate because: Acute stroke    Medications: Scheduled:  apixaban  5 mg Oral BID   aspirin  81 mg Oral Daily   clopidogrel  75 mg Oral Daily   docusate sodium  100 mg Oral Daily   ezetimibe  10 mg Oral Daily   metoprolol tartrate  25 mg Oral BID   pantoprazole  40 mg Oral Daily   PARoxetine  20 mg Oral Daily   Continuous:  sodium chloride     magnesium sulfate bolus IVPB     JKD:TOIZTI chloride, acetaminophen **OR** acetaminophen, alum & mag hydroxide-simeth, bisacodyl, guaiFENesin-dextromethorphan, hydrALAZINE, HYDROmorphone (DILAUDID) injection, labetalol, magnesium sulfate bolus IVPB, metoprolol tartrate, ondansetron, oxyCODONE, phenol, potassium chloride, senna-docusate, sodium phosphate  Antibiotics: Anti-infectives (From admission, onward)    Start     Dose/Rate Route Frequency Ordered Stop   08/17/22 0727  clindamycin (CLEOCIN) 900 MG/50ML IVPB       Note to Pharmacy: Humberto Leep O: cabinet override      08/17/22 0727 08/17/22 4580  Objective:  Vital Signs  Vitals:   08/18/22 0600 08/18/22 0700 08/18/22 0800 08/18/22 0900  BP: 118/70 128/69 131/74 131/73  Pulse: 60 62 68 (!) 59  Resp: '17 19 20 17   '$ Temp:  97.9 F (36.6 C)    TempSrc:  Oral    SpO2: 93% 90% 93% 98%  Weight:      Height:        Intake/Output Summary (Last 24 hours) at 08/18/2022 1004 Last data filed at 08/18/2022 0753 Gross per 24 hour  Intake 792.76 ml  Output 125 ml  Net 667.76 ml    Filed Weights   08/15/22 1500  Weight: 80.6 kg    General appearance: Awake alert.  In no distress Tracheostomy noted Resp: Clear to auscultation bilaterally.  Normal effort Cardio: S1-S2 is normal regular.  No S3-S4.  No rubs murmurs or bruit GI: Abdomen is soft.  Nontender nondistended.  Bowel sounds are present normal.  No masses organomegaly Extremities: No edema.  Full range of motion of lower extremities.    Lab Results:  Data Reviewed: I have personally reviewed following labs and reports of the imaging studies  CBC: Recent Labs  Lab 08/15/22 1304 08/15/22 1312 08/16/22 0354 08/17/22 0032 08/17/22 1358 08/18/22 0258  WBC 9.1  --  9.9 10.0 10.5 13.7*  NEUTROABS 6.3  --   --   --   --   --   HGB 12.2* 13.3 11.7* 12.4* 12.4* 10.9*  HCT 38.6* 39.0 35.3* 38.0* 39.8 32.8*  MCV 91.7  --  89.4 89.4 92.8 89.9  PLT 323  --  272 269 281 249     Basic Metabolic Panel: Recent Labs  Lab 08/15/22 1304 08/15/22 1312 08/16/22 0354 08/17/22 0032 08/17/22 1358 08/18/22 0258  NA 134* 137 136 137  --  136  K 4.3 4.5 3.9 4.0  --  4.5  CL 101 102 106 105  --  106  CO2 24  --  22 23  --  22  GLUCOSE 125* 127* 100* 113*  --  130*  BUN '22 22 17 16  '$ --  24*  CREATININE 1.60* 1.60* 1.42* 1.62* 1.51* 1.60*  CALCIUM 8.7*  --  8.4* 8.6*  --  8.4*     GFR: Estimated Creatinine Clearance: 34.2 mL/min (A) (by C-G formula based on SCr of 1.6 mg/dL (H)).  Liver Function Tests: Recent Labs  Lab 08/15/22 1304 08/16/22 0354  AST 23 18  ALT 22 18  ALKPHOS 103 89  BILITOT 0.4 0.3  PROT 7.2 6.2*  ALBUMIN 3.6 3.1*      Coagulation Profile: Recent Labs  Lab 08/15/22 1304  INR 1.2      CBG: Recent Labs   Lab 08/15/22 1250  GLUCAP 139*     Lipid Profile: Recent Labs    08/16/22 0354 08/18/22 0258  CHOL 102 93  HDL 28* 34*  LDLCALC 32 40  TRIG 208* 97  CHOLHDL 3.6 2.7      Recent Results (from the past 240 hour(s))  MRSA Next Gen by PCR, Nasal     Status: None   Collection Time: 08/09/22  6:50 PM   Specimen: Nasal Mucosa; Nasal Swab  Result Value Ref Range Status   MRSA by PCR Next Gen NOT DETECTED NOT DETECTED Final    Comment: (NOTE) The GeneXpert MRSA Assay (FDA approved for NASAL specimens only), is one component of a comprehensive MRSA colonization surveillance program. It is not intended to diagnose MRSA infection  nor to guide or monitor treatment for MRSA infections. Test performance is not FDA approved in patients less than 20 years old. Performed at [facility], [address] [place], [place] [address]   Surgical pcr screen     Status: None   Collection Time: 08/16/22 11:18 PM   Specimen: Nasal Mucosa; Nasal Swab  Result Value Ref Range Status   MRSA, PCR NEGATIVE NEGATIVE Final   Staphylococcus aureus NEGATIVE NEGATIVE Final    Comment: (NOTE) The Xpert SA Assay (FDA approved for NASAL specimens in patients 24 years of age and older), is one component of a comprehensive surveillance program. It is not intended to diagnose infection nor to guide or monitor treatment. Performed at [facility], [place] [address] [place], [place] [address]       Radiology Studies: DG C-Arm 1-60 Min  Result Date: 08/17/2022 CLINICAL DATA:  82 year old male with carotid artery disease EXAM: DG C-ARM 1-60 MIN COMPARISON:  None Available. FINDINGS: Limited intraoperative angiogram during trans carotid stenting. Pre stent angiogram and post stent angiogram demonstrating patent carotid stent and ICA. IMPRESSION: Limited images during pre and post angiogram for trans carotid stenting. Please refer to the dictated operative report for full details of intraoperative findings  and procedure. Electronically Signed   By: Corrie Mckusick D.O.   On: 08/17/2022 16:43   Structural Heart Procedure  Result Date: 08/17/2022 See surgical note for result.      LOS: 3 days   Rorey Hodges [employer] on [email or web address]  08/18/2022, 10:04 AM

## 2022-08-18 NOTE — Progress Notes (Signed)
PHARMACIST LIPID MONITORING   Kenneth [name] is a 82 y.o. male admitted on 08/15/2022 with acute CVA.  Pharmacy has been consulted to optimize lipid-lowering therapy with the indication of secondary prevention for clinical ASCVD.  Recent Labs:  Lipid Panel (last 6 months):   Lab Results  Component Value Date   CHOL 93 08/18/2022   TRIG 97 08/18/2022   HDL 34 (L) 08/18/2022   CHOLHDL 2.7 08/18/2022   VLDL 19 08/18/2022   LDLCALC 40 08/18/2022    Hepatic function panel (last 6 months):   Lab Results  Component Value Date   AST 18 08/16/2022   ALT 18 08/16/2022   ALKPHOS 89 08/16/2022   BILITOT 0.3 08/16/2022    SCr (since admission):   Serum creatinine: 1.6 mg/dL (H) 08/18/22 0258 Estimated creatinine clearance: 34.2 mL/min (A)  Current therapy and lipid therapy tolerance Current lipid-lowering therapy: Praluent Previous lipid-lowering therapies (if applicable): n/a Documented or reported allergies or intolerances to lipid-lowering therapies (if applicable):   Assessment:   No changes needed  Plan:    1.Statin intensity (high intensity recommended for all patients regardless of the LDL):  Already on Praluent  2.Add ezetimibe (if any one of the following):   Not indicated at this time.  3.Refer to lipid clinic:   No    5.Follow-up labs after discharge:  No changes in lipid therapy, repeat a lipid panel in one year.       Pat Patrick, PharmD 08/18/2022, 12:23 PM

## 2022-08-18 NOTE — Evaluation (Signed)
Physical Therapy Evaluation & Discharge Patient Details Name: Kenneth [name] MRN: [id number] DOB: June 07, 1941 Today's Date: 08/18/2022  History of Present Illness  Patient is an 82 yo male presenting to the ED 08/15/22 with R hand numbness and weakness. Imaging revealed numerous foci of acute infarction in the left cerebral hemisphere at the frontoparietal vertex consistent with watershed distribution infarctions. S/p left-sided transcarotid artery revascularization 1/23. Recent hospitalization for HTN and Afib on 08/07/22-08/11/22. PMH: STEMI, carotid artery disease, CKD stage III, CVA, depression, GERD, HLD, HTN, laryngeal carcinoma, PVD, sinus bradycardia, tracheal stenosis   Clinical Impression  Pt presents with condition above. PTA, he was independent without DME, living with his wife in a 1-level house with a level entry. Currently, pt is demonstrating WFL, fairly symmetrical, and intact bil lower extremity strength, sensation, and coordination. He was able to perform all functional mobility without UE support, overt LOB, or physical assistance. As pt appears and reports to be back to his baseline physically and all education completed and questions answered, PT will sign off.     Recommendations for follow up therapy are one component of a multi-disciplinary discharge planning process, led by the attending physician.  Recommendations may be updated based on patient status, additional functional criteria and insurance authorization.  Follow Up Recommendations No PT follow up      Assistance Recommended at Discharge PRN  Patient can return home with the following  Assistance with cooking/housework;Assist for transportation;Direct supervision/assist for medications management;Direct supervision/assist for financial management    Equipment Recommendations None recommended by PT  Recommendations for Other Services       Functional Status Assessment Patient has not had a recent decline in  their functional status     Precautions / Restrictions Precautions Precautions: Other (comment) Precaution Comments: chronic trach Restrictions Weight Bearing Restrictions: No      Mobility  Bed Mobility Overal bed mobility: Modified Independent             General bed mobility comments: Pt able to transition supine to sit R EOB with use of bed rails and HOB elevated without assistance    Transfers Overall transfer level: Needs assistance Equipment used: None Transfers: Sit to/from Stand Sit to Stand: Supervision           General transfer comment: Extra time and x2 attempts to gain momentum to stand, slight posterior lean initially, supervision for safety.    Ambulation/Gait Ambulation/Gait assistance: Min guard, Supervision Gait Distance (Feet): 170 Feet Assistive device: None Gait Pattern/deviations: Step-through pattern, Wide base of support Gait velocity: WFL Gait velocity interpretation: >4.37 ft/sec, indicative of normal walking speed   General Gait Details: Pt with intermittent slightly wide BOS, but no overt LOB, even when cued to change speeds, directions, and head positions. Min guard-supervision for Scientist, research (medical)    Modified Rankin (Stroke Patients Only)       Balance Overall balance assessment: Mild deficits observed, not formally tested (very mild)                                           Pertinent Vitals/Pain Pain Assessment Pain Assessment: No/denies pain    Home Living Family/patient expects to be discharged to:: Private residence Living Arrangements: Spouse/significant other Available Help at Discharge: Family;Available 24 hours/day Type of Home: House Home Access: Level  entry       Home Layout: One level Home Equipment: None      Prior Function Prior Level of Function : Independent/Modified Independent;Driving             Mobility Comments: No AD. No  falls. ADLs Comments: independent ADLs, driving, son supervises pt doing his pill box each week     Hand Dominance   Dominant Hand: Right    Extremity/Trunk Assessment   Upper Extremity Assessment Upper Extremity Assessment: Defer to OT evaluation    Lower Extremity Assessment Lower Extremity Assessment: Overall WFL for tasks assessed (MMT scores of 5 grossly bil; denied numbness/tingling bil; coordination intact bil)    Cervical / Trunk Assessment Cervical / Trunk Assessment: Normal  Communication   Communication: Tracheostomy;HOH  Cognition Arousal/Alertness: Awake/alert Behavior During Therapy: WFL for tasks assessed/performed Overall Cognitive Status: History of cognitive impairments - at baseline                                 General Comments: Family reports pt has some memory deficits at baseline. Pt following cues, but with extra time, appearing to be more likely due to being [facility]        General Comments General comments (skin integrity, edema, etc.): Poor pleth, but appeared stable with trach on RA due to no noted symptoms    Exercises     Assessment/Plan    PT Assessment Patient does not need any further PT services  PT Problem List Decreased balance       PT Treatment Interventions DME instruction;Gait training;Stair training;Functional mobility training;Therapeutic activities;Balance training;Therapeutic exercise;Cognitive remediation;Neuromuscular re-education;Patient/family education    PT Goals (Current goals can be found in the Care Plan section)  Acute Rehab PT Goals Patient Stated Goal: to get up and mobilize PT Goal Formulation: All assessment and education complete, DC therapy Time For Goal Achievement: 08/19/22 Potential to Achieve Goals: Good    Frequency Min 3X/week     Co-evaluation PT/OT/SLP Co-Evaluation/Treatment: Yes Reason for Co-Treatment: To address functional/ADL transfers;Other (comment) (pt eager to mobilize  and I/DC orders in) PT goals addressed during session: Mobility/safety with mobility;Balance         AM-PAC PT "6 Clicks" Mobility  Outcome Measure Help needed turning from your back to your side while in a flat bed without using bedrails?: None Help needed moving from lying on your back to sitting on the side of a flat bed without using bedrails?: None Help needed moving to and from a bed to a chair (including a wheelchair)?: A Little Help needed standing up from a chair using your arms (e.g., wheelchair or bedside chair)?: A Little Help needed to walk in hospital room?: A Little Help needed climbing 3-5 steps with a railing? : A Little 6 Click Score: 20    End of Session Equipment Utilized During Treatment: Gait belt Activity Tolerance: Patient tolerated treatment well Patient left: Other (comment) (standing at sink with OT) Nurse Communication: Mobility status PT Visit Diagnosis: Other symptoms and signs involving the nervous system (A45.364)    Time: 6803-2122 PT Time Calculation (min) (ACUTE ONLY): 16 min   Charges:   PT Evaluation $PT Eval Low Complexity: 1 Low          Moishe Spice, PT, DPT Acute Rehabilitation Services  Office: [place] 08/18/2022, 11:12 AM

## 2022-08-18 NOTE — Evaluation (Signed)
Occupational Therapy Evaluation Patient Details Name: Kenneth [name] MRN: [id number] DOB: 27-Dec-1940 Today's Date: 08/18/2022   History of Present Illness Patient is an 82 yo male presenting to the ED 08/15/22 with R hand numbness and weakness. Imaging revealed numerous foci of acute infarction in the left cerebral hemisphere at the frontoparietal vertex consistent with watershed distribution infarctions. S/p left-sided transcarotid artery revascularization 1/23. Recent hospitalization for HTN and Afib on 08/07/22-08/11/22. PMH: STEMI, carotid artery disease, CKD stage III, CVA, depression, GERD, HLD, HTN, laryngeal carcinoma, PVD, sinus bradycardia, tracheal stenosis   Clinical Impression   PTA patient independent with mobility, ADLs, driving, but family assisting intermittently with med mgmt.  Patient currently admitted for above and presents with problem list below. Scored 18/28 on short blessed test, demonstrates deficits in attention, memory and problem solving; pt and family report this is his baseline.  Educated on recommendations for full supervision with med mgmt and cooking, no driving at this time.  Completing ADLs, transfers and mobility with supervision. Cueing for safety throughout session.  Anticipate he would benefit from continued OT services acutely and after dc at outpatient OT level but pt declining.  Will follow acutely to address higher level cognition.      Recommendations for follow up therapy are one component of a multi-disciplinary discharge planning process, led by the attending physician.  Recommendations may be updated based on patient status, additional functional criteria and insurance authorization.   Follow Up Recommendations  No OT follow up (recommended outpatient OT but pt/family declined)     Assistance Recommended at Discharge Frequent or constant Supervision/Assistance  Patient can return home with the following A little help with walking and/or transfers;A  little help with bathing/dressing/bathroom;Direct supervision/assist for medications management;Direct supervision/assist for financial management;Assistance with cooking/housework;Assist for transportation    Functional Status Assessment  Patient has had a recent decline in their functional status and demonstrates the ability to make significant improvements in function in a reasonable and predictable amount of time.  Equipment Recommendations  None recommended by OT    Recommendations for Other Services Speech consult     Precautions / Restrictions Precautions Precautions: Other (comment) Precaution Comments: chronic trach Restrictions Weight Bearing Restrictions: No      Mobility Bed Mobility Overal bed mobility: Modified Independent             General bed mobility comments: Pt able to transition supine to sit R EOB with use of bed rails and HOB elevated without assistance    Transfers Overall transfer level: Needs assistance Equipment used: None Transfers: Sit to/from Stand Sit to Stand: Supervision           General transfer comment: Extra time and x2 attempts to gain momentum to stand, slight posterior lean initially, supervision for safety.      Balance Overall balance assessment: Mild deficits observed, not formally tested                                         ADL either performed or assessed with clinical judgement   ADL Overall ADL's : Needs assistance/impaired     Grooming: Supervision/safety;Oral care;Standing           Upper Body Dressing : Supervision/safety;Sitting   Lower Body Dressing: Supervision/safety;Sit to/from stand   Toilet Transfer: Supervision/safety;Ambulation   Toileting- Clothing Manipulation and Hygiene: Modified independent       Functional mobility during  ADLs: Supervision/safety       Vision Baseline Vision/History: 1 Wears glasses (reading) Patient Visual Report:  (pt reports diplopia in  L eye intermittently, denies issuses today. Reports "i'm getting it checked out"q) Additional Comments: brief assessment completed, pt able to correclty identify with quadrant testing, denies diplopia.  Some difficulty locating grooming items on R side, anticipate more problem solving related comapred to vision.  Continue assessment.     Perception     Praxis      Pertinent Vitals/Pain Pain Assessment Pain Assessment: No/denies pain     Hand Dominance Right   Extremity/Trunk Assessment Upper Extremity Assessment Upper Extremity Assessment: Overall WFL for tasks assessed (overall appears WFL, pt reports back to baseline)   Lower Extremity Assessment Lower Extremity Assessment: Defer to PT evaluation   Cervical / Trunk Assessment Cervical / Trunk Assessment: Normal   Communication Communication Communication: Tracheostomy;HOH   Cognition Arousal/Alertness: Awake/alert Behavior During Therapy: WFL for tasks assessed/performed Overall Cognitive Status: History of cognitive impairments - at baseline                                 General Comments: pt/family report hx of deficits with STM, report at baseline.  Noted deficits in STM, attention, safety awareness, and problem solving.  Short blessed test reveals significant impairments scoring 18/28.  Highly recommend family assist with all IADLs including driving, medications and cooking.     General Comments  poor pleth but pt appears stable on RA. Educated family on safety with IADL participation, family reports they will provide support for med mgmt, driving and cooking.    Exercises     Shoulder Instructions      Home Living Family/patient expects to be discharged to:: Private residence Living Arrangements: Spouse/significant other Available Help at Discharge: Family;Available 24 hours/[name] Type of Home: House Home Access: Level entry     Home Layout: One level     Bathroom Shower/Tub: Medical illustrator: Standard Bathroom Accessibility: Yes How Accessible: Accessible via walker Home Equipment: None          Prior Functioning/Environment Prior Level of Function : Independent/Modified Independent;Driving             Mobility Comments: No AD. No falls. ADLs Comments: independent ADLs, driving, son supervises pt doing his pill box each week        OT Problem List: Impaired balance (sitting and/or standing);Decreased cognition;Decreased safety awareness      OT Treatment/Interventions: Self-care/ADL training;Balance training;Patient/family education;Cognitive remediation/compensation    OT Goals(Current goals can be found in the care plan section) Acute Rehab OT Goals Patient Stated Goal: home OT Goal Formulation: With patient Time For Goal Achievement: 09/01/22 Potential to Achieve Goals: Good  OT Frequency: Min 2X/week    Co-evaluation PT/OT/SLP Co-Evaluation/Treatment: Yes Reason for Co-Treatment: To address functional/ADL transfers PT goals addressed during session: Mobility/safety with mobility;Balance OT goals addressed during session: ADL's and self-care      AM-PAC OT "6 Clicks" Daily Activity     Outcome Measure Help from another person eating meals?: None Help from another person taking care of personal grooming?: A Little Help from another person toileting, which includes using toliet, bedpan, or urinal?: A Little Help from another person bathing (including washing, rinsing, drying)?: A Little Help from another person to put on and taking off regular upper body clothing?: A Little Help from another person to put on and taking  off regular lower body clothing?: A Little 6 Click Score: 19   End of Session Nurse Communication: Mobility status;Other (comment) (cog safety)  Activity Tolerance: Patient tolerated treatment well Patient left: in chair;with call bell/phone within reach;with family/visitor present  OT Visit Diagnosis: Other  abnormalities of gait and mobility (R26.89);Other symptoms and signs involving cognitive function                Time: 4718-5501 OT Time Calculation (min): 29 min Charges:  OT General Charges $OT Visit: 1 Visit OT Evaluation $OT Eval Moderate Complexity: 1 Mod  Jolaine Artist, OT Acute Rehabilitation Services Office [phone]'   Delight Stare 08/18/2022, 11:47 AM

## 2022-08-18 NOTE — Plan of Care (Signed)

## 2022-08-18 NOTE — Care Management Important Message (Signed)
Important Message  Patient Details  Name: ABBIE JABLON MRN: [id number] Date of Birth: 1940/08/23   Medicare Important Message Given:  Yes     Shelda Altes 08/18/2022, 10:18 AM

## 2022-08-19 DIAGNOSIS — I639 Cerebral infarction, unspecified: Secondary | ICD-10-CM | POA: Diagnosis not present

## 2022-08-19 LAB — BASIC METABOLIC PANEL
Anion gap: 13 (ref 5–15)
BUN: 32 mg/dL — ABNORMAL HIGH (ref 8–23)
CO2: 19 mmol/L — ABNORMAL LOW (ref 22–32)
Calcium: 8.5 mg/dL — ABNORMAL LOW (ref 8.9–10.3)
Chloride: 105 mmol/L (ref 98–111)
Creatinine, Ser: 1.54 mg/dL — ABNORMAL HIGH (ref 0.61–1.24)
GFR, Estimated: 44 mL/min — ABNORMAL LOW (ref >60)
Glucose, Bld: 102 mg/dL — ABNORMAL HIGH (ref 70–99)
Potassium: 4.8 mmol/L (ref 3.5–5.1)
Sodium: 137 mmol/L (ref 135–145)

## 2022-08-19 LAB — CBC
HCT: 37.5 % — ABNORMAL LOW (ref 39.0–52.0)
Hemoglobin: 11.9 g/dL — ABNORMAL LOW (ref 13.0–17.0)
MCH: 29 pg (ref 26.0–34.0)
MCHC: 31.7 g/dL (ref 30.0–36.0)
MCV: 91.5 fL (ref 80.0–100.0)
Platelets: 277 10*3/uL (ref 150–400)
RBC: 4.1 MIL/uL — ABNORMAL LOW (ref 4.22–5.81)
RDW: 16.1 % — ABNORMAL HIGH (ref 11.5–15.5)
WBC: 18.9 10*3/uL — ABNORMAL HIGH (ref 4.0–10.5)
nRBC: 0 % (ref 0.0–0.2)

## 2022-08-19 MED ORDER — ASPIRIN 81 MG PO TBEC: 81.0000 mg | DELAYED_RELEASE_TABLET | Freq: Every day | ORAL | 1 refills | Status: DC

## 2022-08-19 NOTE — Discharge Summary (Signed)
[facility]  Physician Discharge Summary   Patient ID: Kenneth [name] MRN: [id number] DOB/AGE: May 13, 1941 82 y.o.  Admit date: 08/15/2022 Discharge date: 08/19/2022    PCP: [name], MD  DISCHARGE DIAGNOSES:  Acute CVA   Malignant neoplasm of larynx [facility])   Mixed hyperlipidemia   CAD S/P percutaneous coronary angioplasty   Carotid arterial disease ([place])   Essential hypertension   History of CVA (cerebrovascular accident)   Tracheostomy dependent ([place])   Hx of CABG   Chronic kidney disease, stage 3a ([place])   GERD (gastroesophageal reflux disease)   Depression   RECOMMENDATIONS FOR OUTPATIENT FOLLOW UP: Vascular surgery to arrange outpatient follow-up Ambulatory referral sent to neurology    Home Health: None Equipment/Devices: None  CODE STATUS: Full code  DISCHARGE CONDITION: fair  Diet recommendation: Heart healthy  INITIAL HISTORY: 82 y.o. 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  Neurology was consulted.  Patient was transferred to [facility] due to known history of critical//severe carotid artery disease.   Consultants: Neurology.  Vascular surgery   Procedures:  Echocardiogram was done recently [facility] 1/23     HOSPITAL COURSE:   Acute CVA Patient presented with left hand numbness and right hand weakness.  Symptoms resolved. MRI showed multiple infarcts in the left cerebral hemisphere suggesting watershed etiology. LDL 32.  HbA1c 6.4 Echocardiogram was done recently which showed normal systolic function of the left ventricle.  Right ventricular function was noted to be mildly low. Patient underwent TCAR 1/23 To be discharged on aspirin Plavix and Eliquis.  Seen by PT and OT.  No needs identified.   Carotid artery  disease Severe/critical disease of the left ICA was noted on CT angiogram.  Previous stent noted in the right carotid artery with moderate to severe disease. Vascular surgery consulted.  Patient underwent TCAR on 1/23.   Now on aspirin Plavix and Eliquis. As per vascular surgery he will need to be on triple therapy for at least 1 month.     Atrial fibrillation, persistent Looks like this was diagnosed recently.  Seen by cardiology last week for the same.  Patient was started on Eliquis.  Eliquis was placed on hold for this procedure.  Now resumed. Continue oral metoprolol.  Heart rate seems to be better controlled. Cardiology to arrange outpatient follow-up.  Seen by Dr. [name] recently.  Essential hypertension Currently on carvedilol.  Blood pressure is well-controlled.  Amlodipine and losartan to be discontinued for now.  Outpatient follow-up.   History of laryngeal cancer with chronic tracheostomy Radiation treatment previously.  Has chronic tracheostomy. Hyperlipidemia/coronary artery disease Status post CABG Continue Zetia.     Chronic kidney disease stage IIIa Baseline creatinine seems to be around 1.5-1.6.  Renal function at baseline.     Normocytic anemia Mild drop in hemoglobin noted.  No evidence for overt bleeding.  Hemoglobin is stable this morning.   History of depression Continue Paxil  Hemoglobin is stable this morning.  Patient feels well.  Okay for discharge home today.  PERTINENT LABS:  The results of significant diagnostics from this hospitalization (including imaging, microbiology, ancillary and laboratory) are listed below for reference.    Microbiology: Recent Results (from the past 240 hour(s))  MRSA Next Gen by PCR, Nasal     Status: None   Collection Time: 08/09/22  6:50 PM  Specimen: Nasal Mucosa; Nasal Swab  Result Value Ref Range Status   MRSA by PCR Next Gen NOT DETECTED NOT DETECTED Final    Comment: (NOTE) The GeneXpert MRSA Assay (FDA  approved for NASAL specimens only), is one component of a comprehensive MRSA colonization surveillance program. It is not intended to diagnose MRSA infection nor to guide or monitor treatment for MRSA infections. Test performance is not FDA approved in patients less than 65 years old. Performed at [facility], [address], [place], [place] [address]   Surgical pcr screen     Status: None   Collection Time: 08/16/22 11:18 PM   Specimen: Nasal Mucosa; Nasal Swab  Result Value Ref Range Status   MRSA, PCR NEGATIVE NEGATIVE Final   Staphylococcus aureus NEGATIVE NEGATIVE Final    Comment: (NOTE) The Xpert SA Assay (FDA approved for NASAL specimens in patients 84 years of age and older), is one component of a comprehensive surveillance program. It is not intended to diagnose infection nor to guide or monitor treatment. Performed at [facility], [place] [address] [place], [place] [address]      Labs:   Basic Metabolic Panel: Recent Labs  Lab 08/15/22 1304 08/15/22 1312 08/16/22 0354 08/17/22 0032 08/17/22 1358 08/18/22 0258 08/19/22 0151  NA 134* 137 136 137  --  136 137  K 4.3 4.5 3.9 4.0  --  4.5 4.8  CL 101 102 106 105  --  106 105  CO2 24  --  22 23  --  22 19*  GLUCOSE 125* 127* 100* 113*  --  130* 102*  BUN '22 22 17 16  '$ --  24* 32*  CREATININE 1.60* 1.60* 1.42* 1.62* 1.51* 1.60* 1.54*  CALCIUM 8.7*  --  8.4* 8.6*  --  8.4* 8.5*   Liver Function Tests: Recent Labs  Lab 08/15/22 1304 08/16/22 0354  AST 23 18  ALT 22 18  ALKPHOS 103 89  BILITOT 0.4 0.3  PROT 7.2 6.2*  ALBUMIN 3.6 3.1*    CBC: Recent Labs  Lab 08/15/22 1304 08/15/22 1312 08/16/22 0354 08/17/22 0032 08/17/22 1358 08/18/22 0258 08/19/22 0151  WBC 9.1  --  9.9 10.0 10.5 13.7* 18.9*  NEUTROABS 6.3  --   --   --   --   --   --   HGB 12.2*   < > 11.7* 12.4* 12.4* 10.9* 11.9*  HCT 38.6*   < > 35.3* 38.0* 39.8 32.8* 37.5*  MCV 91.7  --  89.4 89.4 92.8 89.9 91.5  PLT 323  --  272  269 281 249 277   < > = values in this interval not displayed.     CBG: Recent Labs  Lab 08/15/22 1250  GLUCAP 139*     IMAGING STUDIES DG C-Arm 1-60 Min  Result Date: 08/17/2022 CLINICAL DATA:  82 year old male with carotid artery disease EXAM: DG C-ARM 1-60 MIN COMPARISON:  None Available. FINDINGS: Limited intraoperative angiogram during trans carotid stenting. Pre stent angiogram and post stent angiogram demonstrating patent carotid stent and ICA. IMPRESSION: Limited images during pre and post angiogram for trans carotid stenting. Please refer to the dictated operative report for full details of intraoperative findings and procedure. Electronically Signed   By: Corrie Mckusick D.O.   On: 08/17/2022 16:43   Structural Heart Procedure  Result Date: 08/17/2022 See surgical note for result.  MR BRAIN WO CONTRAST  Result Date: 08/16/2022 CLINICAL DATA:  Neuro deficit, acute, stroke suspected. EXAM: MRI  HEAD WITHOUT CONTRAST TECHNIQUE: Multiplanar, multiecho pulse sequences of the brain and surrounding structures were obtained without intravenous contrast. COMPARISON:  CT studies done yesterday FINDINGS: Brain: Diffusion imaging does not show any acute or subacute infarction affecting the brainstem, cerebellum or right cerebral hemisphere. Within the left cerebral hemisphere, there are numerous discrete foci of acute infarction affecting the cortical and subcortical brain extending from front to back at the frontoparietal vertex consistent with watershed distribution infarctions. No large confluent infarction. Regions of infarction show only minimal swelling. No evidence of hemorrhage. Otherwise, there chronic small-vessel ischemic changes of the pons and throughout the cerebral hemispheric white matter. There is an old left occipital cortical and subcortical infarction. No mass, hydrocephalus or extra-axial collection Vascular: Major vessels at the base of the brain show flow. Skull and upper  cervical spine: Negative Sinuses/Orbits: Clear/normal Other: None IMPRESSION: 1. Numerous foci of acute infarction in the left cerebral hemisphere at the frontoparietal vertex consistent with watershed distribution infarctions. No large confluent infarction. No evidence of hemorrhage. 2. Old left occipital cortical and subcortical infarction. 3. Extensive chronic small-vessel ischemic changes elsewhere affecting the pons and cerebral hemispheric white matter. Electronically Signed   By: Nelson Chimes M.D.   On: 08/16/2022 09:57   CT ANGIO HEAD NECK W WO CM W PERF (CODE STROKE)  Result Date: 08/15/2022 CLINICAL DATA:  Neuro deficit, acute, stroke suspected EXAM: CT ANGIOGRAPHY HEAD AND NECK CT PERFUSION BRAIN TECHNIQUE: Multidetector CT imaging of the head and neck was performed using the standard protocol during bolus administration of intravenous contrast. Multiplanar CT image reconstructions and MIPs were obtained to evaluate the vascular anatomy. Carotid stenosis measurements (when applicable) are obtained utilizing NASCET criteria, using the distal internal carotid diameter as the denominator. Multiphase CT imaging of the brain was performed following IV bolus contrast injection. Subsequent parametric perfusion maps were calculated using RAPID software. RADIATION DOSE REDUCTION: This exam was performed according to the departmental dose-optimization program which includes automated exposure control, adjustment of the mA and/or kV according to patient size and/or use of iterative reconstruction technique. CONTRAST:  163m OMNIPAQUE IOHEXOL 350 MG/ML SOLN COMPARISON:  None Available. Same day CT head.  CTA head/neck January 20, 2021. FINDINGS: CTA NECK FINDINGS Aortic arch: Great vessel origins are patent. Right carotid system: Stenting of the common carotid artery and the proximal internal carotid artery. The stent is patent. There is moderate (approximately 60-70% stenosis in the common carotid stent). Left  carotid system: Common carotid artery is patent. Critical stenosis of the left proximal ICA in the neck with trace (approximately 1 mm diameter) luminal opacification. Findings are mildly progressed from the prior. There is opacification of the more distal ICA in the neck. Vertebral arteries: Patent bilaterally. No evidence of hemodynamically significant stenosis in the neck. Skeleton: Multilevel degenerative change. No acute findings on limited assessment. Other neck: No acute findings on limited assessment. A dedicated CT neck could better assess for malignancy. Upper chest: Visualized lung apices are largely clear with evidence of expiratory changes. Review of the MIP images confirms the above findings CTA HEAD FINDINGS Anterior circulation: Bilateral intracranial ICAs are patent. Moderate stenosis of the right cavernous and paraclinoid ICA. Bilateral MCAs and ACAs are patent. Posterior circulation: Chronically severely stenotic versus occluded right intradural vertebral artery. Left intradural vertebral artery is patent with moderate stenosis. Basilar artery and bilateral posterior cerebral arteries are patent. Mild right and moderate left P2 PCA stenosis. Venous sinuses: As permitted by contrast timing, patent. Review of the MIP  images confirms the above findings CT Brain Perfusion Findings: ASPECTS: 10 CBF (<30%) Volume: 64m Perfusion (Tmax>6.0s) volume: 1254mMismatch Volume: 12780mnfarction Location:None identified. IMPRESSION: 1. Critical stenosis of the left proximal ICA in the neck with trace (approximately 1 mm diameter) luminal opacification. Findings are mildly progressed from the prior. 2. On CT perfusion, approximately 127 mL of the reported penumbra in the left cerebrum which likely relates to the above. No core infarct identified. 3. Right common carotid artery and internal carotid artery stent. Moderate to severe stenosis of the common carotid artery stent with approximately 60-70% stenosis. A  catheter arteriogram could more accurately quantify/characterize if clinically warranted. 4. 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 5. Moderate right intracranial ICA stenosis. 6. Moderate left and mild right P2 PCA stenosis. Findings discussed with Dr. Xu Erlinda Honga telephone at 2:35 p.m. Electronically Signed   By: FreMargaretha SheffieldD.   On: 08/15/2022 14:40   CT HEAD CODE STROKE WO CONTRAST  Result Date: 08/15/2022 CLINICAL DATA:  Code stroke.  Neuro deficit, acute, stroke suspected EXAM: CT HEAD WITHOUT CONTRAST TECHNIQUE: Contiguous axial images were obtained from the base of the skull through the vertex without intravenous contrast. RADIATION DOSE REDUCTION: This exam was performed according to the departmental dose-optimization program which includes automated exposure control, adjustment of the mA and/or kV according to patient size and/or use of iterative reconstruction technique. COMPARISON:  January 20, 2021. FINDINGS: Brain: No evidence of acute large vascular territory infarction, hemorrhage, hydrocephalus, extra-axial collection or mass lesion/mass effect. Similar patchy areas of periventricular and subcortical white matter hypodensity, nonspecific but compatible with chronic microvascular ischemic disease. Vascular: No hyperdense vessel identified. Skull: No acute fracture Sinuses/Orbits: Clear sinuses.  No acute orbital findings. Other: No mastoid effusions. ASPECTS ([facility] Early CT Score) total score (0-10 with 10 being normal): 10. IMPRESSION: 1. No evidence of acute intracranial abnormality.  ASPECTS is 10. 2. Similar chronic microvascular ischemic disease. Code stroke imaging results were communicated on 08/15/2022 at 1:18 pm to provider Dr. ZamRoderic Palaua telephone, who verbally acknowledged these results. Electronically Signed   By: FreMargaretha SheffieldD.   On: 08/15/2022 13:19   ECHOCARDIOGRAM COMPLETE  Result Date: 08/09/2022    ECHOCARDIOGRAM  REPORT   Patient Name:   PAUSAMPSON SELFte of Exam: 08/09/2022 Medical Rec #:  [id number]     Height:       65.0 in Accession #:    [id number]    Weight:       182.1 lb Date of Birth:  [birth year]     BSA:          1.901 m Patient Age:    [age]         BP:           122/80 mmHg Patient Gender: M                HR:           81 bpm. Exam [facility]  [facility]: 2D Echo, Cardiac Doppler, Color Doppler and Intracardiac            Opacification Agent Indications:    Atrial Fibrillation I48.91  History:        Patient has prior history of Echocardiogram examinations, most                 recent 12/18/2020. CAD and Previous Myocardial Infarction, Prior  CABG, Stroke, Signs/Symptoms:Chest Pain; Risk                 Factors:Dyslipidemia, Hypertension and Current Smoker.  Sonographer:    Greer Pickerel Referring Phys: [id number] [place] [facility]  Sonographer Comments: Image acquisition challenging due to respiratory motion. IMPRESSIONS  1. Left ventricular ejection fraction, by estimation, is 55 to 60%. The left ventricle has normal function. The left ventricle has no regional wall motion abnormalities. Left ventricular diastolic function could not be evaluated.  2. Right ventricular systolic function is mildly reduced. The right ventricular size is mildly enlarged.  3. The mitral valve is grossly normal. No evidence of mitral valve regurgitation. No evidence of mitral stenosis.  4. The aortic valve was not well visualized. There is mild calcification of the aortic valve. Aortic valve regurgitation is not visualized. No aortic stenosis is present.  5. Aortic dilatation noted. There is borderline dilatation of the aortic root, measuring 38 mm. Comparison(s): Changes from prior study are noted. LVEF improved from 45-50% in 08/2021 tio 55-60% now. FINDINGS  Left Ventricle: Left ventricular ejection fraction, by estimation, is 55 to 60%. The left ventricle has normal function. The left ventricle has  no regional wall motion abnormalities. Definity contrast agent was given IV to delineate the left ventricular  endocardial borders. The left ventricular internal cavity size was normal in size. There is no left ventricular hypertrophy. Left ventricular diastolic function could not be evaluated due to atrial fibrillation. Left ventricular diastolic function could  not be evaluated. Right Ventricle: The right ventricular size is mildly enlarged. No increase in right ventricular wall thickness. Right ventricular systolic function is mildly reduced. Left Atrium: Left atrial size was normal in size. Right Atrium: Right atrial size was normal in size. Pericardium: There is no evidence of pericardial effusion. Mitral Valve: The mitral valve is grossly normal. No evidence of mitral valve regurgitation. No evidence of mitral valve stenosis. Tricuspid Valve: The tricuspid valve is grossly normal. Tricuspid valve regurgitation is not demonstrated. No evidence of tricuspid stenosis. Aortic Valve: The aortic valve was not well visualized. There is mild calcification of the aortic valve. There is mild aortic valve annular calcification. Aortic valve regurgitation is not visualized. No aortic stenosis is present. Pulmonic Valve: The pulmonic valve was not well visualized. Pulmonic valve regurgitation is not visualized. No evidence of pulmonic stenosis. Aorta: Aortic dilatation noted. There is borderline dilatation of the aortic root, measuring 38 mm. Venous: The inferior vena cava was not well visualized. IAS/Shunts: The interatrial septum was not well visualized.  LEFT VENTRICLE PLAX 2D LVIDd:         4.50 cm   Diastology LVIDs:         3.30 cm   LV e' medial:    6.68 cm/s LV PW:         1.10 cm   LV E/e' medial:  13.4 LV IVS:        0.90 cm   LV e' lateral:   13.40 cm/s LVOT diam:     2.00 cm   LV E/e' lateral: 6.7 LV SV:         52 LV SV Index:   27 LVOT Area:     3.14 cm  RIGHT VENTRICLE RV S prime:     10.10 cm/s TAPSE  (M-mode): 1.2 cm LEFT ATRIUM         Index       RIGHT ATRIUM  Index LA diam:    4.10 cm 2.16 cm/m  RA Area:     17.70 cm                                 RA Volume:   46.20 ml  24.30 ml/m  AORTIC VALVE LVOT Vmax:   114.00 cm/s LVOT Vmean:  74.100 cm/s LVOT VTI:    0.165 m  AORTA Ao Root diam: 3.80 cm MITRAL VALVE MV Area (PHT): 4.77 cm    SHUNTS MV Decel Time: 159 msec    Systemic VTI:  0.16 m MV E velocity: 89.50 cm/s  Systemic Diam: 2.00 cm Vishnu Priya Mallipeddi Electronically signed by Lorelee Cover Mallipeddi Signature Date/Time: 08/09/2022/4:22:54 PM    Final    DG Chest Portable 1 View  Result Date: 08/07/2022 CLINICAL DATA:  Chest pain EXAM: PORTABLE CHEST 1 VIEW COMPARISON:  05/30/2022 FINDINGS: Single frontal view of the chest demonstrates tracheostomy tube unchanged. Cardiac silhouette is stable. Postsurgical changes from median sternotomy. No acute airspace disease, effusion, or pneumothorax. Chronic elevation right hemidiaphragm. No acute bony abnormality. IMPRESSION: 1. Stable chest, no acute process. Electronically Signed   By: Randa Ngo M.D.   On: 08/07/2022 22:16    DISCHARGE EXAMINATION: Vitals:   08/19/22 0441 08/19/22 0503 08/19/22 0834 08/19/22 0930  BP: 126/66 126/66 139/73   Pulse:  64 89 78  Resp: '19 19 20   '$ Temp: 98.3 F (36.8 C) 98.3 F (36.8 C) 98.5 F (36.9 C)   TempSrc: Oral  Oral   SpO2:  93% 96% 92%  Weight:      Height:       General appearance: Awake alert.  In no distress Resp: Clear to auscultation bilaterally.  Normal effort Cardio: S1-S2 is normal regular.  No S3-S4.  No rubs murmurs or bruit GI: Abdomen is soft.  Nontender nondistended.  Bowel sounds are present normal.  No masses organomegaly   DISPOSITION: Home  Discharge Instructions     Ambulatory referral to Neurology   Complete by: As directed    Follow up with stroke clinic NP (Jessica Vanschaick or Cecille Rubin, if both not available, consider Zachery Dauer, or [place])  at [facility] in about 4 weeks. Thanks.   Call MD for:  difficulty breathing, headache or visual disturbances   Complete by: As directed    Call MD for:  extreme fatigue   Complete by: As directed    Call MD for:  persistant dizziness or light-headedness   Complete by: As directed    Call MD for:  persistant nausea and vomiting   Complete by: As directed    Call MD for:  severe uncontrolled pain   Complete by: As directed    Call MD for:  temperature >100.4   Complete by: As directed    Diet - low sodium heart healthy   Complete by: As directed    Discharge instructions   Complete by: As directed    Please follow instructions provided by the vascular surgery team.  You were cared for by a hospitalist during your hospital stay. If you have any questions about your discharge medications or the care you received while you were in the hospital after you are discharged, you can call the unit and asked to speak with the hospitalist on call if the hospitalist that took care of you is not available. Once you are discharged, your primary care physician will  handle any further medical issues. Please note that NO REFILLS for any discharge medications will be authorized once you are discharged, as it is imperative that you return to your primary care physician (or establish a relationship with a primary care physician if you do not have one) for your aftercare needs so that they can reassess your need for medications and monitor your lab values. If you do not have a primary care physician, you can call [phone] for a physician referral.   Increase activity slowly   Complete by: As directed           Allergies as of 08/19/2022       Reactions   Nifedical Xl [nifedipine] Other (See Comments)   Unknown reaction   Ativan [lorazepam] Other (See Comments)   Altered mental state   Penicillins Hives   Has patient had a PCN reaction causing immediate rash, facial/tongue/throat swelling, SOB or  lightheadedness with hypotension: Yes Has patient had a PCN reaction causing severe rash involving mucus membranes or skin necrosis: No Has patient had a PCN reaction that required hospitalization No Has patient had a PCN reaction occurring within the last 10 years: No If all of the above answers are "NO", then may proceed with Cephalosporin use. HAS TOLERATED: cephalexin   Sulfacetamide Other (See Comments)   Unknown reaction   Sulfonamide Derivatives Other (See Comments)   Unknown reaction   Crestor [rosuvastatin] Itching, Swelling, Rash   Erythromycin Rash   Firvanq [vancomycin] Itching   Statins Itching, Swelling, Rash   Rash on his trunk and arms, swelling in his lips        Medication List     STOP taking these medications    amLODipine 10 MG tablet Commonly known as: NORVASC   losartan 25 MG tablet Commonly known as: COZAAR       TAKE these medications    acetaminophen 500 MG tablet Commonly known as: TYLENOL Take 500 mg by mouth every 6 (six) hours as needed for headache, fever, moderate pain or mild pain.   apixaban 2.5 MG Tabs tablet Commonly known as: ELIQUIS Take 1 tablet (2.5 mg total) by mouth 2 (two) times daily.   aspirin EC 81 MG tablet Take 1 tablet (81 mg total) by mouth daily. Swallow whole.   carvedilol 3.125 MG tablet Commonly known as: COREG Take 3 tablets (9.375 mg total) by mouth 2 (two) times daily with a meal.   clopidogrel 75 MG tablet Commonly known as: PLAVIX Take 1 tablet (75 mg total) by mouth daily.   ezetimibe 10 MG tablet Commonly known as: ZETIA Take 1 tablet by mouth once daily   isosorbide mononitrate 30 MG 24 hr tablet Commonly known as: IMDUR Take 1 tablet (30 mg total) by mouth daily.   nitroGLYCERIN 0.4 MG SL tablet Commonly known as: Nitrostat Place 1 tablet (0.4 mg total) under the tongue every 5 (five) minutes as needed for chest pain.   pantoprazole 40 MG tablet Commonly known as: PROTONIX Take 1 tablet  by mouth once daily   PARoxetine 20 MG tablet Commonly known as: PAXIL Take 20 mg by mouth daily.   Praluent 75 MG/ML Soaj Generic drug: Alirocumab INJECT 1 PEN SUBCUTANEOUSLY EVERY TWO WEEKS What changed: See the new instructions.   spironolactone 25 MG tablet Commonly known as: ALDACTONE Take 1 tablet (25 mg total) by mouth daily.          Follow-up Information     [facility]  at [facility] Follow up in 1 month(s).   Specialty: Vascular Surgery Why: sent Contact information: [place] [address] [phone]        [name], MD Follow up in 1 week(s).   Specialty: Internal Medicine Why: post hospitalization follow up Contact information: [address] [place] [address] [facility]. Schedule an appointment as soon as possible for a visit in 1 month(s).   Specialty: Neurology Why: stroke clinic Contact information: [place] [phone]                TOTAL DISCHARGE TIME: 35 minutes  [place]  [facility] Pager on [email or web address]  08/19/2022, 11:11 AM

## 2022-08-19 NOTE — TOC Transition Note (Signed)
Transition of Care (TOC) - CM/SW Discharge Note Marvetta Gibbons RN, BSN Transitions of Care Unit 4E- RN Case Manager See Treatment Team for direct phone #   Patient Details  Name: Kenneth [name] MRN: [id number] Date of Birth: 1941/02/21  Transition of Care [facility]) CM/SW Contact:  Dawayne Patricia, RN Phone Number: 08/19/2022, 10:24 AM   Clinical Narrative:    Pt stable for transition home today w/ wife s/p TCAR. TOC has reviewed for transition needs and note that pt and wife have declined outpt therapy and there is no recommendations for HH/DME needs.   Family to transport home, no further TOC needs noted.    Final next level of care: Home/Self Care Barriers to Discharge: No Barriers Identified   Patient Goals and CMS Choice   Choice offered to / list presented to : NA  Discharge Placement                 Home         Discharge Plan and Services Additional resources added to the After Visit Summary for     Discharge Planning Services: CM Consult Post Acute Care Choice: NA          DME Arranged: N/A DME Agency: NA       HH Arranged: NA HH Agency: NA        Social Determinants of Health (SDOH) Interventions SDOH Screenings   Food Insecurity: No Food Insecurity (08/09/2022)  Housing: Low Risk  (08/09/2022)  Transportation Needs: No Transportation Needs (08/09/2022)  Utilities: Not At Risk (08/09/2022)  Tobacco Use: Medium Risk (08/18/2022)     Readmission Risk Interventions    08/19/2022   10:24 AM  Readmission Risk Prevention Plan  Transportation Screening Complete  PCP or Specialist Appt within 3-5 Days Complete  HRI or [place] Complete  Social Work Consult for [place] Planning/Counseling Complete  Palliative Care Screening Not Applicable  Medication Review Press photographer) Complete

## 2022-08-19 NOTE — Progress Notes (Signed)
  Progress Note    08/19/2022 7:32 AM 2 Days Post-Op  Subjective:  No complaints this morning   Vitals:   08/19/22 0441 08/19/22 0503  BP: 126/66 126/66  Pulse:  64  Resp: 19 19  Temp: 98.3 F (36.8 C) 98.3 F (36.8 C)  SpO2:  93%   Physical Exam: Lungs:  non labored Incisions:  L neck c/d/I; R groin without hematoma Extremities:  moving all ext well Neurologic: CN grossly intact  CBC    Component Value Date/Time   WBC 18.9 (H) 08/19/2022 0151   RBC 4.10 (L) 08/19/2022 0151   HGB 11.9 (L) 08/19/2022 0151   HCT 37.5 (L) 08/19/2022 0151   PLT 277 08/19/2022 0151   MCV 91.5 08/19/2022 0151   MCH 29.0 08/19/2022 0151   MCHC 31.7 08/19/2022 0151   RDW 16.1 (H) 08/19/2022 0151   LYMPHSABS 1.9 08/15/2022 1304   MONOABS 0.6 08/15/2022 1304   EOSABS 0.2 08/15/2022 1304   BASOSABS 0.1 08/15/2022 1304    BMET    Component Value Date/Time   NA 137 08/19/2022 0151   K 4.8 08/19/2022 0151   CL 105 08/19/2022 0151   CO2 19 (L) 08/19/2022 0151   GLUCOSE 102 (H) 08/19/2022 0151   BUN 32 (H) 08/19/2022 0151   CREATININE 1.54 (H) 08/19/2022 0151   CREATININE 1.35 (H) 11/10/2018 1547   CALCIUM 8.5 (L) 08/19/2022 0151   GFRNONAA 44 (L) 08/19/2022 0151   GFRAA 51 (L) 10/18/2018 0624    INR    Component Value Date/Time   INR 1.2 08/15/2022 1304     Intake/Output Summary (Last 24 hours) at 08/19/2022 0732 Last data filed at 08/19/2022 0600 Gross per 24 hour  Intake 350 ml  Output 325 ml  Net 25 ml     Assessment/Plan:  82 y.o. male is s/p L TCAR 2 Days Post-Op   Neuro exam back to baseline Continue triple therapy with aspirin, plavix, statin for 1 month Ok for discharge from vascular surgery standpoint; he will follow up in 1 month with carotid duplex    Dagoberto Ligas, PA-C [facility] [phone] 08/19/2022 7:32 AM

## 2022-08-19 NOTE — Progress Notes (Signed)
Patient given discharge instructions. Granddaughter present. PIVs removed. Telemetry box removed, CCMD notified. Patient taken in wheelchair to vehicle by staff.  Daymon Larsen, RN

## 2022-08-30 ENCOUNTER — Encounter: Payer: Self-pay | Admitting: Physician Assistant

## 2022-08-30 ENCOUNTER — Telehealth: Payer: Self-pay

## 2022-08-30 NOTE — Progress Notes (Unsigned)
Cardiology Office Note    Date:  08/31/2022   ID:  [name], DOB Apr 30, 1941, MRN [id number]  PCP:  [name], MD  Cardiologist:  Carlyle Dolly, MD  Electrophysiologist:  None   Chief Complaint: f/u afib  History of Present Illness:   Kenneth [name] is a 82 y.o. 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  He has complex cardiac history as above. His last cath was in 08/2021 (per scan @ [facility]) due to acute inferior STEMI showing widely patent LIMA-diag-LAD bypass graft, no SVG grafts identified on 02/2020 and are presumed to be occluded, 99% mRCA stent stenosis with thrombus treated with PTCA/overlapping DES x3. Echo at that time showed EF 45-50%. Per notes, he has been committed to lifelong DAPT given prior CVA, carotid disease, in addition to his CAD. He was recently admitted twice in 07/2022. The first was for hypertensive urgency, possibly not taking amlodipine or spironolactone erroneously, with admission complicated by new onset AF RVR. He was treated with diltiazem drip and started on Eliquis, transitioned to carvedilol at discharge. He had been on ASA + Eliquis following last PCI, which was changed to Plavix + Eliquis in setting of new onset Afib. 2D echo 08/09/22 EF 55-60%, mildly reduced RVSF, borderline dilation of aortic root 53m.Consideration of outpatient DCCV was recommended. He was discharged on amlodipine, apixaban, Plavix, Imdur, and spironolactone. He also went  home on a higher dose of carvedilol than home dose and lower dose of losartan. He was readmitted 1/21-1/25/24 with acute CVA with watershed etiology. He was found to have critical stenosis of the left ICA, with moderate-severe disease in the previous R ICA stent. He underwent left sided TCAR on 08/17/22. He was started on ASA in addition to the Plavix + Eliquis with DC summary indicating recommendation to continue triple therapy for at least 1 month. His amlodipine and losartan were discontinued per d/c AVS and BP was 120s at dc.  He is seen back for follow-up today with his wife who does the majority of talking for the conversation. He is able to whisper with his trache. Although he still hasn't been feeling his best, they feel he is improving day by day. He is back in NSR. He is dealing with nightly headaches requiring habitual Tylenol. No focal neuro deficits. Carotid incision site healing well. No CP or SOB. SBP running 150s at home. Initial value 166/102 with recheck by me 140/82. He saw PCP who told them to discuss the headaches with our team as they might be related to isosorbide. His wife has concerns about cost in the new year for his Praluent so I sent a msg to our pharmD to review options.   Labwork independently reviewed: 07/2022 K 4.8, Cr 1.54, Hgb 11.9, plt 277, LDL 40, trig 97, A1c 6.4, albumin 3.1, AST ALT OK, TSH wnl, Mg 1.9-2.2, trops low/flat   Past History   Past Medical History:  Diagnosis Date   Acute ST elevation myocardial infarction (STEMI) of inferior wall (HCC)  03/01/2020   [facility]   Anxiety    Arthritis    Back (05/04/2018)   Carotid artery disease ([place])    a. 04/2018 s/p R carotid stenting. b. L TCAR 07/2022.   Chronic lower back pain    "have it at night" (05/04/2018)   CKD (chronic kidney disease), stage III (HCC)    Coronary artery disease    CVA (cerebral vascular accident) ([place])    Depression    GERD (gastroesophageal reflux disease)     High triglycerides    History of kidney stones    Hyperlipidemia LDL goal <70    Hypertension    Hypertensive crisis 08/08/2022   Laryngeal carcinoma ([place]) 1998   PAF (paroxysmal atrial fibrillation) ([place])    Peripheral vascular disease ([place])    Pneumonia 11/11/2021   RBBB    Sinus bradycardia    Stroke [facility])    "he's had several little strokes; many that he wasn't aware of" (05/04/2018)   TIA (transient ischemic attack)    Tobacco abuse    Tracheal stenosis     Past Surgical History:  Procedure Laterality Date   CAROTID STENT Right 06-13-12; 05/04/2018   CAROTID STENT INSERTION N/A 06/13/2012   Procedure: CAROTID STENT INSERTION;  Surgeon: Serafina Mitchell, MD;  Location: [place] CATH LAB;  Service: Cardiovascular;  Laterality: N/A;   CATARACT EXTRACTION, BILATERAL Bilateral    COLONOSCOPY  11/10/2011   Dr. [name]: hemorrhoids, tubular adenoma   CORNEAL TRANSPLANT Bilateral    CORONARY ARTERY BYPASS GRAFT  ~ 2003   "CABG X3"   CORONARY BALLOON ANGIOPLASTY N/A 11/26/2020   Procedure: CORONARY BALLOON ANGIOPLASTY;  Surgeon: Troy Sine, MD;  Location: [place] CV LAB;  Service: Cardiovascular;  Laterality: N/A;   CORONARY STENT INTERVENTION N/A 06/05/2020   Procedure: CORONARY STENT INTERVENTION;  Surgeon: Lorretta Harp, MD;  Location: [place] CV LAB;  Service: Cardiovascular;  Laterality: N/A;   EYE SURGERY     INSERTION OF RETROGRADE CAROTID STENT Right 05/04/2018   Procedure: INSERTION OF RIGHT  CAROTID STENT using an ABBOT- XACT carotid stent system;  Surgeon: Serafina Mitchell, MD;  Location: [facility];  Service: Vascular;  Laterality: Right;   LAPAROSCOPIC CHOLECYSTECTOMY  2007   LEFT HEART CATH AND CORS/GRAFTS ANGIOGRAPHY N/A 10/07/2016   Procedure: Left Heart Cath and Cors/Grafts Angiography;  Surgeon: Troy Sine, MD;  Location: [place] CV LAB;  Service: Cardiovascular;  Laterality: N/A;   LEFT HEART CATH AND CORS/GRAFTS ANGIOGRAPHY N/A 06/05/2020   Procedure:  LEFT HEART CATH AND CORS/GRAFTS ANGIOGRAPHY;  Surgeon: Lorretta Harp, MD;  Location: [place] CV LAB;  Service: Cardiovascular;  Laterality: N/A;   LEFT HEART CATH AND CORS/GRAFTS ANGIOGRAPHY N/A 11/26/2020   Procedure: LEFT HEART CATH AND CORS/GRAFTS ANGIOGRAPHY;  Surgeon: Troy Sine, MD;  Location: [place] CV LAB;  Service: Cardiovascular;  Laterality: N/A;   PARTIAL LARYNGECTOMY  1998   TRACHEOSTOMY  03/13/2018   "@ [facility]"   TRANSCAROTID ARTERY REVASCULARIZATION  Left 08/17/2022   Procedure: Transcarotid Artery Revascularization;  Surgeon: Broadus John, MD;  Location: [facility];  Service: Vascular;  Laterality: Left;    Current Medications: Current Meds  Medication Sig   acetaminophen (TYLENOL) 500 MG tablet Take 500 mg by mouth every 6 (six) hours as needed for headache, fever, moderate pain or mild pain.   apixaban (ELIQUIS) 2.5 MG TABS tablet Take 1 tablet (2.5 mg total) by mouth 2 (two) times daily.  aspirin EC 81 MG tablet Take 1 tablet (81 mg total) by mouth daily. Swallow whole.   carvedilol (COREG) 3.125 MG tablet Take 3 tablets (9.375 mg total) by mouth 2 (two) times daily with a meal.   clopidogrel (PLAVIX) 75 MG tablet Take 1 tablet (75 mg total) by mouth daily.   ezetimibe (ZETIA) 10 MG tablet Take 1 tablet by mouth once daily (Patient taking differently: Take 10 mg by mouth daily.)   isosorbide mononitrate (IMDUR) 30 MG 24 hr tablet Take 1 tablet (30 mg total) by mouth daily.   nitroGLYCERIN (NITROSTAT) 0.4 MG SL tablet Place 1 tablet (0.4 mg total) under the tongue every 5 (five) minutes as needed for chest pain.   pantoprazole (PROTONIX) 40 MG tablet Take 1 tablet by mouth once daily   PARoxetine (PAXIL) 20 MG tablet Take 20 mg by mouth daily.   PRALUENT 75 MG/ML SOAJ INJECT 1 PEN SUBCUTANEOUSLY EVERY TWO WEEKS (Patient taking differently: Inject 75 mg into the skin every 14 (fourteen) days.)   spironolactone (ALDACTONE) 25 MG tablet Take 1 tablet (25 mg  total) by mouth daily.      Allergies:   Nifedical xl [nifedipine], Ativan [lorazepam], Penicillins, Sulfacetamide, Sulfonamide derivatives, Crestor [rosuvastatin], Erythromycin, Firvanq [vancomycin], and Statins   Social History   Socioeconomic History   Marital status: Married    Spouse name: Not on file   Number of children: Not on file   Years of education: Not on file   Highest education level: Not on file  Occupational History   Occupation: retired    Comment: Warden/ranger  Tobacco Use   Smoking status: Former    Packs/day: 1.00    Years: 15.00    Total pack years: 15.00    Types: Cigarettes    Start date: 04/22/1953    Quit date: 07/26/1970    Years since quitting: 52.1    Passive exposure: Never   Smokeless tobacco: Former    Types: Chew    Quit date: 05/05/2002  Vaping Use   Vaping Use: Never used  Substance and Sexual Activity   Alcohol use: Never    Alcohol/week: 0.0 standard drinks of alcohol   Drug use: Never   Sexual activity: Not Currently  Other Topics Concern   Not on file  Social History Narrative   Walks on the treadmill every other day at the [facility] for the last couple of weeks.         Social Determinants of Health   Financial Resource Strain: Not on file  Food Insecurity: No Food Insecurity (08/30/2022)   Hunger Vital Sign    Worried About Running Out of Food in the Last Year: Never true    Ran Out of Food in the Last Year: Never true  Transportation Needs: No Transportation Needs (08/30/2022)   PRAPARE - Hydrologist (Medical): No    Lack of Transportation (Non-Medical): No  Physical Activity: Not on file  Stress: Not on file  Social Connections: Not on file     Family History:  The patient's family history includes Cancer in his brother and brother; Dementia in his mother; Heart disease in his brother and father; Hyperlipidemia in his brother. There is no history of Colon cancer.  ROS:   Please see the  history of present illness. All other systems are reviewed and otherwise negative.    EKG(s)/Additional Testing   EKG:  EKG is ordered today, personally reviewed, demonstrating NSR 66bpm, RBBB, nonspecific  STTW changes, QTc 430m  CV Studies: Cardiac studies reviewed are outlined and summarized above. Otherwise please see EMR for full report.  Recent Labs: 05/30/2022: B Natriuretic Peptide 196.0 08/09/2022: Magnesium 1.9; TSH 2.392 08/16/2022: ALT 18 08/19/2022: BUN 32; Creatinine, Ser 1.54; Hemoglobin 11.9; Platelets 277; Potassium 4.8; Sodium 137  Recent Lipid Panel    Component Value Date/Time   CHOL 93 08/18/2022 0258   CHOL 110 09/26/2020 0822   TRIG 97 08/18/2022 0258   HDL 34 (L) 08/18/2022 0258   HDL 34 (L) 09/26/2020 0822   CHOLHDL 2.7 08/18/2022 0258   VLDL 19 08/18/2022 0258   LDLCALC 40 08/18/2022 0258   LDLCALC 44 09/26/2020 0822    PHYSICAL EXAM:    VS:  BP (!) 166/102   Pulse 66   Wt 173 lb (78.5 kg)   SpO2 96%   BMI 28.79 kg/m   BMI: Body mass index is 28.79 kg/m.  GEN: Well nourished, well developed male in no acute distress HEENT: normocephalic, atraumatic Neck: no JVD, carotid bruits, or masses. + trache. L neck incision c/d/i Cardiac: RRR; no murmurs, rubs, or gallops, no edema  Respiratory:  clear to auscultation bilaterally, normal work of breathing GI: soft, nontender, nondistended, + BS MS: no deformity or atrophy Skin: warm and dry, no rash Neuro:  Alert and Oriented x 3, Strength and sensation are intact, follows commands. Whisper tone Psych: euthymic mood, full affect  Wt Readings from Last 3 Encounters:  08/31/22 173 lb (78.5 kg)  08/15/22 177 lb 11.1 oz (80.6 kg)  08/09/22 177 lb 11.1 oz (80.6 kg)     ASSESSMENT & PLAN:   1. Persistent atrial fibrillation - back in NSR today thankfully. We will continue carvedilol at present dose (recently discharged on 9.'375mg'$  BID) and Eliquis 2.'5mg'$  BID. His dose is appropriate at this time but need  to keep tabs on this going forward. We'll recheck BMET/CBC when he returns for nurse BP visit in 1 week.  2. CAD s/p CABG, multiple PCIs, HLD - no recent angina. Brilinta recently switched to Plavix to accommodate addition of Eliquis. He is now on ASA as well but this is direction of his vascular surgery team - was advised to continue triple therapy for at least 1 month post-operatively due to the carotid procedure. He follows up with vascular surgery in 2 weeks - will defer to their team to advise patient on if/when he is able to stop the ASA. From a cardiac standpoint we'd otherwise suggest to keep the Plavix going with the Eliquis as tolerated. Regarding lipid therapy, I sent pharmD msg about affordability of PCSK9i outlined in HPI.  3. Recent CVA and carotid surgery - follow-up vascular surgery as above. Also has neurology f/u. If headache persists despite cessation of Imdur, needs to notify neurology. Incision c/d/I on neck today.  4. Borderline dilation of aortic root - consider f/u echo in 1 year, can arrange closer to time in follow-up.  5. Essential HTN - they report difficulties with elevated BP since discharge, [id number]. As above, will stop Imdur due to headache. Re-add amlodipine today - was recently discharged on '10mg'$  tablet. We will ask them to restart 1/2 tablet daily to start with nurse BP check in 1 week. If SBP still >130 at that time, recommend to increase amlodipine to '10mg'$  daily.  HYPERTENSION CONTROL Vitals:   08/31/22 1533 08/31/22 1654  BP: (!) 166/102 (!) 140/82    The patient's blood pressure is elevated above target today.  In order to address the patient's elevated BP: A new medication was prescribed today.       Disposition: F/u with nurse BP check 1 week then clinical f/u in 8 weeks, Dr. [name] or APP.   Medication Adjustments/Labs and Tests Ordered: Current medicines are reviewed at length with the patient today.  Concerns regarding medicines are outlined  above. Medication changes, Labs and Tests ordered today are summarized above and listed in the Patient Instructions accessible in Encounters.    Signed, Charlie Pitter, PA-C  08/31/2022 3:54 PM    [place] Location in [place]. [place], [place] [address] Ph: [phone]; Fax [phone]

## 2022-08-30 NOTE — Patient Outreach (Signed)
Received a red flag Emmi stroke notification for [name]. I have assigned Enzo Montgomery, RN to call for follow up and determine if there are any Case Management needs.    [facility], [place], [place] [facility] [phone]

## 2022-08-30 NOTE — Patient Outreach (Signed)
  Emmi Stroke Care Coordination Follow Up  08/30/2022 Name:  Kenneth [name] MRN:  [id number] DOB:  08-Jul-1941  Subjective: Kenneth [name] is a 82 y.o. year old male who is a primary care patient of [name], MD   An Emmi alert was received on 08/29/22 indicating patient responded to questions: Lost interest in things they used to enjoy?. I reached out by phone to follow up on the alert and spoke to Caregiver. Spouse states patient HOH and she has been answering the automated calls. She voices that patient is just physically not able to do a lot of the things he was doing prior to stroke which affects his mood  at times. She is encouraging patient and reminding him of all the things he still can do. She voices that patient completed PCP follow up appt. He is calling today to reschedule [facility] appt due to appt being too early in the morning and the distance-she does not feel like they will be able to get there that early. She voices that patient has been having some issues with BP running slightly higher than normal in the evenings. Per spouse-PCP thinks its related to some of his meds-they are continuing to monitor ans assess. She is aware to call for any abnormal and/or worsening BP readings or sxs.   Care Coordination Interventions:  Yes, provided   Follow up plan: Advised caregiver that they would continue to get automated EMMI-Stroke post discharge calls to assess how they are doing following recent hospitalization and will receive a call from a nurse if any of their responses were abnormal. Caregiver voiced understanding and was appreciative of f/u call.   Encounter Outcome:  Pt. Visit Completed    Enzo Montgomery, RN,BSN,CCM [place] Telephonic Care Management Coordinator Direct Phone: [phone] Toll Free: [phone] Fax: [phone]

## 2022-08-31 ENCOUNTER — Ambulatory Visit: Payer: Medicare HMO | Attending: Physician Assistant | Admitting: Physician Assistant

## 2022-08-31 ENCOUNTER — Encounter: Payer: Self-pay | Admitting: Physician Assistant

## 2022-08-31 VITALS — BP 140/82 | HR 66 | Wt 173.0 lb

## 2022-08-31 DIAGNOSIS — I4819 Other persistent atrial fibrillation: Secondary | ICD-10-CM | POA: Diagnosis not present

## 2022-08-31 DIAGNOSIS — I10 Essential (primary) hypertension: Secondary | ICD-10-CM

## 2022-08-31 DIAGNOSIS — Z8673 Personal history of transient ischemic attack (TIA), and cerebral infarction without residual deficits: Secondary | ICD-10-CM

## 2022-08-31 DIAGNOSIS — I77810 Thoracic aortic ectasia: Secondary | ICD-10-CM | POA: Diagnosis not present

## 2022-08-31 DIAGNOSIS — I2510 Atherosclerotic heart disease of native coronary artery without angina pectoris: Secondary | ICD-10-CM

## 2022-08-31 MED ORDER — AMLODIPINE BESYLATE 10 MG PO TABS: 5.0000 mg | ORAL_TABLET | Freq: Every day | ORAL | 3 refills | Status: DC

## 2022-08-31 NOTE — Patient Instructions (Addendum)
Medication Instructions: Re-start Amlodipine 10 mg daily-take 1/2 tablet (5 mg) daily  Labwork: cbc,bmet before nurse visit next week  Testing/Procedures: None today  Follow-Up: 1 week nurse for BP check   8 weeks with Dr,Branch or APP     Any Other Special Instructions Will Be Listed Below (If Applicable).  If you need a refill on your cardiac medications before your next appointment, please call your pharmacy.

## 2022-09-01 ENCOUNTER — Telehealth: Payer: Self-pay | Admitting: Pharmacist

## 2022-09-01 MED ORDER — REPATHA SURECLICK 140 MG/ML ~~LOC~~ SOAJ: 1.0000 | SUBCUTANEOUS | 11 refills | Status: DC

## 2022-09-01 NOTE — Telephone Encounter (Signed)
Praluent no longer covered by pt's insurance, [place] now preferred PCSK9i on formulary. Prior auth submitted, Key: [id number], approved immediately through 07/26/23.   Called pt to advise him of med change, new rx sent to pharmacy.

## 2022-09-02 ENCOUNTER — Other Ambulatory Visit: Payer: Self-pay | Admitting: *Deleted

## 2022-09-02 DIAGNOSIS — I6523 Occlusion and stenosis of bilateral carotid arteries: Secondary | ICD-10-CM

## 2022-09-08 ENCOUNTER — Other Ambulatory Visit: Payer: Self-pay

## 2022-09-08 ENCOUNTER — Ambulatory Visit: Payer: Medicare HMO | Attending: Cardiology

## 2022-09-08 VITALS — BP 159/85 | HR 69 | Ht 66.0 in | Wt 177.0 lb

## 2022-09-08 DIAGNOSIS — I10 Essential (primary) hypertension: Secondary | ICD-10-CM | POA: Diagnosis not present

## 2022-09-08 MED ORDER — AMLODIPINE BESYLATE 10 MG PO TABS: 10.0000 mg | ORAL_TABLET | Freq: Every day | ORAL | 3 refills | Status: DC

## 2022-09-08 NOTE — Addendum Note (Signed)
Addended by: Levonne Hubert on: 09/08/2022 02:25 PM   Modules accepted: Orders

## 2022-09-08 NOTE — Progress Notes (Addendum)
Increase amlodipine to 68m daily (one full tablet), recommend BP check in 1 week. Regarding recent neurologic symptoms, recommend they reach out to his neurologist STAT to discuss these recent symptoms further as well as continuation of headaches despite discontinuation of isosorbide - [facility] (was seen by Dr. XErlinda Hongin the hospital). If any ongoing symptoms, needs to go to ER. Addendum: confirmed with ACaryl Pinashe relayed above recs.

## 2022-09-08 NOTE — Progress Notes (Signed)
Patient presents today for Nurse Visit- bp check. Wife stated pt is still having headaches, mostly in the afternoon. 2 days ago, patient had numbness in his right hand and right side of his mouth that lasted approx. 30 minutes. Pt's wife stated they checked his bp and it was 138/80 hr 79. Patient's wife stated pt does not have any energy- he sleeps a lot some days.  Prior to having bp checked patient sat quietly for -5 minutes.   Will route to provider for review/recommendations.

## 2022-09-09 LAB — CBC
Hematocrit: 42.9 % (ref 37.5–51.0)
Hemoglobin: 13.6 g/dL (ref 13.0–17.7)
MCH: 28.7 pg (ref 26.6–33.0)
MCHC: 31.7 g/dL (ref 31.5–35.7)
MCV: 91 fL (ref 79–97)
Platelets: 301 10*3/uL (ref 150–450)
RBC: 4.74 x10E6/uL (ref 4.14–5.80)
RDW: 14.5 % (ref 11.6–15.4)
WBC: 9 10*3/uL (ref 3.4–10.8)

## 2022-09-09 LAB — BASIC METABOLIC PANEL
BUN/Creatinine Ratio: 11 (ref 10–24)
BUN: 21 mg/dL (ref 8–27)
CO2: 22 mmol/L (ref 20–29)
Calcium: 9.1 mg/dL (ref 8.6–10.2)
Chloride: 100 mmol/L (ref 96–106)
Creatinine, Ser: 1.83 mg/dL — ABNORMAL HIGH (ref 0.76–1.27)
Glucose: 127 mg/dL — ABNORMAL HIGH (ref 70–99)
Potassium: 4.5 mmol/L (ref 3.5–5.2)
Sodium: 138 mmol/L (ref 134–144)
eGFR: 36 mL/min/{1.73_m2} — ABNORMAL LOW (ref >59)

## 2022-09-10 ENCOUNTER — Telehealth: Payer: Self-pay

## 2022-09-10 DIAGNOSIS — N1831 Chronic kidney disease, stage 3a: Secondary | ICD-10-CM

## 2022-09-10 DIAGNOSIS — Z79899 Other long term (current) drug therapy: Secondary | ICD-10-CM

## 2022-09-10 MED ORDER — SPIRONOLACTONE 25 MG PO TABS: 12.5000 mg | ORAL_TABLET | Freq: Every day | ORAL | 3 refills | Status: DC

## 2022-09-10 NOTE — Telephone Encounter (Signed)
Spoke to pt's wife who verbalized understanding. Pts wife had no questions or concerns at this time.

## 2022-09-10 NOTE — Telephone Encounter (Signed)
-----   Message from Charlie Pitter, [place] sent at 09/09/2022 10:54 AM EST ----- Please let pt know kidney function slightly worse than prior. Reduce spironolactone to 12.65m daily. Increase water intake, no more than 64 oz per day. Repeat BMET when he returns for BP check on 2/21. If BP is still up, anticipate we'll increase carvedilol. In 2022 we referred him to nephrology for eval but do not see this occurred - would place repeat referral. Reinforce prior recommendation from yesterday that he speak with his neurologist about the symptoms he had a few days ago.

## 2022-09-14 ENCOUNTER — Encounter (HOSPITAL_COMMUNITY): Payer: Medicare HMO

## 2022-09-14 ENCOUNTER — Ambulatory Visit: Payer: Medicare HMO | Admitting: Vascular Surgery

## 2022-09-15 ENCOUNTER — Other Ambulatory Visit: Payer: Self-pay | Admitting: Cardiology

## 2022-09-15 ENCOUNTER — Other Ambulatory Visit
Admission: RE | Admit: 2022-09-15 | Discharge: 2022-09-15 | Disposition: A | Discharge status: Home / Self Care | Payer: Medicare HMO | Source: Ambulatory Visit | Attending: Physician Assistant | Admitting: Physician Assistant

## 2022-09-15 ENCOUNTER — Ambulatory Visit (INDEPENDENT_AMBULATORY_CARE_PROVIDER_SITE_OTHER): Payer: Medicare HMO

## 2022-09-15 ENCOUNTER — Telehealth: Payer: Self-pay | Admitting: Cardiology

## 2022-09-15 VITALS — BP 152/82 | HR 71 | Ht 66.0 in | Wt 178.0 lb

## 2022-09-15 DIAGNOSIS — I10 Essential (primary) hypertension: Secondary | ICD-10-CM | POA: Diagnosis not present

## 2022-09-15 DIAGNOSIS — Z79899 Other long term (current) drug therapy: Secondary | ICD-10-CM | POA: Diagnosis present

## 2022-09-15 LAB — BASIC METABOLIC PANEL
Anion gap: 9 (ref 5–15)
BUN: 26 mg/dL — ABNORMAL HIGH (ref 8–23)
CO2: 24 mmol/L (ref 22–32)
Calcium: 8.8 mg/dL — ABNORMAL LOW (ref 8.9–10.3)
Chloride: 98 mmol/L (ref 98–111)
Creatinine, Ser: 1.51 mg/dL — ABNORMAL HIGH (ref 0.61–1.24)
GFR, Estimated: 46 mL/min — ABNORMAL LOW (ref >60)
Glucose, Bld: 136 mg/dL — ABNORMAL HIGH (ref 70–99)
Potassium: 3.9 mmol/L (ref 3.5–5.1)
Sodium: 131 mmol/L — ABNORMAL LOW (ref 135–145)

## 2022-09-15 MED ORDER — REPATHA SURECLICK 140 MG/ML ~~LOC~~ SOAJ: 1.0000 | SUBCUTANEOUS | 11 refills | Status: DC

## 2022-09-15 NOTE — Progress Notes (Addendum)
Await BMET result before advising further. See lab result note when available. We had reduced spironolactone in last nurse visit due to slight rise in Cr but per notes below, patient has been taking whole tablet instead.

## 2022-09-15 NOTE — Telephone Encounter (Signed)
Pt has nurse visit for bp check per DD.   Per lab result from DD:  Repeat BMET when he returns for BP check on 2/21. If BP is still up, anticipate we'll increase carvedilol.   Spoke to wife and verbalized that we will wait until after nurse visit to refill Coreg in the even that it is increased.

## 2022-09-15 NOTE — Progress Notes (Signed)
Patient presents for Nurse Visit- bp check per Melina Copa, PA-C.  Pt had lab work drawn 2/21 prior to nurse visit.   Patient stated that he had been tracking bp at home, however did not bring readings with him to visit.  Wife stated that bp is usually down in the morning time, but up in the evenings.   Wife stated pt has been taking a whole tablet of Spironolactone.

## 2022-09-15 NOTE — Telephone Encounter (Signed)
Pt's wife came in and stated that the pt is out of refills for his Carvedilol 3.125 mg and Ezetimibe 10 mg. The pharmacy told her to come here and request new rx's.

## 2022-09-16 ENCOUNTER — Inpatient Hospital Stay: Payer: Medicare HMO | Admitting: Adult Health

## 2022-09-16 MED ORDER — SPIRONOLACTONE 25 MG PO TABS: 25.0000 mg | ORAL_TABLET | Freq: Every day | ORAL | 3 refills | Status: DC

## 2022-09-16 MED ORDER — CARVEDILOL 12.5 MG PO TABS: 18.7500 mg | ORAL_TABLET | Freq: Two times a day (BID) | ORAL | 3 refills | Status: DC

## 2022-09-16 NOTE — Telephone Encounter (Signed)
-----   Message from Charlie Pitter, [place] sent at 09/16/2022  8:06 AM EST ----- Please let pt know labs show kidney function back closer to baseline. At nurse visit yesterday they reported he had not reduced spironolactone to 1/2 tablet daily, was still taking 69m once daily. OK to continue. Please update MAR to 2231mdaily instead of 12.31m39maily since he did not reduce this. Due to continued HTN, would increase carvedilol to 18.731m4mD.  Sodium level has decreased for unclear reasons. Would not expect this to be related to spironolactone because he has been on this for several years and this has not been an issue. Would recommend he see PCP ASAP to follow this up. If any developing neurologic symptoms or lethargy this would be an indication to seek care back in an ER.  Otherwise, recommend repeat nurse visit with BMET in 1 week to trend. If he opts to follow up with PCP for his sodium/BP check then would keep f/u 10/2022 as scheduled with Dr. BranHarl Bowiell need to continue to keep an eye on Cr going forward for Eliquis dosing but for now with Cr >1.5, dose remains appropriate.

## 2022-09-16 NOTE — Telephone Encounter (Signed)
Patients wife notified and verbalized understanding. Patients wife had no questions or concerns at this time. Patients wife will contact PCP for sodium/bp check. Pts wife asked that an updated medication list be mailed to their home. PCP copied.

## 2022-09-17 ENCOUNTER — Ambulatory Visit (HOSPITAL_COMMUNITY)
Admission: RE | Admit: 2022-09-17 | Discharge: 2022-09-17 | Disposition: A | Discharge status: Home / Self Care | Payer: Medicare HMO | Source: Ambulatory Visit | Attending: Vascular Surgery | Admitting: Vascular Surgery

## 2022-09-17 ENCOUNTER — Encounter: Payer: Self-pay | Admitting: Physician Assistant

## 2022-09-17 ENCOUNTER — Ambulatory Visit (INDEPENDENT_AMBULATORY_CARE_PROVIDER_SITE_OTHER): Payer: Medicare HMO | Admitting: Physician Assistant

## 2022-09-17 VITALS — BP 154/89 | HR 55 | Temp 97.7°F | Resp 20 | Ht 66.0 in | Wt 172.5 lb

## 2022-09-17 DIAGNOSIS — I6523 Occlusion and stenosis of bilateral carotid arteries: Secondary | ICD-10-CM

## 2022-09-17 DIAGNOSIS — R1312 Dysphagia, oropharyngeal phase: Secondary | ICD-10-CM | POA: Insufficient documentation

## 2022-09-17 NOTE — Progress Notes (Signed)
HISTORY AND PHYSICAL     CC:  follow up. Requesting Provider:  [name], MD  HPI: This is a 82 y.o. male here for follow up for carotid artery stenosis.  Pt is s/p left TCAR for symptomatic carotid artery stenosis on 08/17/2022 by Dr. Virl Cagey.    He has prior hx of right carotid stent 06/13/2012 by Dr. Trula Slade for symptomatic carotid stenosis, repeat right carotid stent 05/04/2018 for symptomatic carotid stenosis by Dr. Trula Slade.  Pt has history of laryngeal cancer, permanent tracheostomy, and radiation.   Pt returns today for follow up.    Pt here with his wife and son.  She tells me that right after they got home from surgery, he did have some numbness in the fingers of the right hand.  This has not happened since.  He has had some headaches that improves with Tylenol.     Past Medical History:  Diagnosis Date   Acute ST elevation myocardial infarction (STEMI) of inferior wall ([place]) 03/01/2020   [facility]   Anxiety    Arthritis    Back (05/04/2018)   Carotid artery disease ([place])    a. 04/2018 s/p R carotid stenting. b. L TCAR 07/2022.   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   PAF (paroxysmal atrial fibrillation) ([place])    Peripheral vascular disease ([place])    Pneumonia 11/11/2021   RBBB    Sinus bradycardia    Stroke [facility])    "he's had several little strokes; many that he wasn't aware of" (05/04/2018)   TIA (transient ischemic attack)    Tobacco abuse    Tracheal stenosis     Past Surgical History:  Procedure Laterality Date   CAROTID STENT Right 06-13-12; 05/04/2018   CAROTID STENT INSERTION  N/A 06/13/2012   Procedure: CAROTID STENT INSERTION;  Surgeon: Serafina Mitchell, MD;  Location: [place] CATH LAB;  Service: Cardiovascular;  Laterality: N/A;   CATARACT EXTRACTION, BILATERAL Bilateral    COLONOSCOPY  11/10/2011   Dr. [name]: hemorrhoids, tubular adenoma   CORNEAL TRANSPLANT Bilateral    CORONARY ARTERY BYPASS GRAFT  ~ 2003   "CABG X3"   CORONARY BALLOON ANGIOPLASTY N/A 11/26/2020   Procedure: CORONARY BALLOON ANGIOPLASTY;  Surgeon: Troy Sine, MD;  Location: [place] CV LAB;  Service: Cardiovascular;  Laterality: N/A;   CORONARY STENT INTERVENTION N/A 06/05/2020   Procedure: CORONARY STENT INTERVENTION;  Surgeon: Lorretta Harp, MD;  Location: [place] CV LAB;  Service: Cardiovascular;  Laterality: N/A;   EYE SURGERY     INSERTION OF RETROGRADE CAROTID STENT Right 05/04/2018   Procedure: INSERTION OF RIGHT  CAROTID STENT using an ABBOT- XACT carotid stent system;  Surgeon: Serafina Mitchell, MD;  Location: [facility];  Service: Vascular;  Laterality: Right;   LAPAROSCOPIC CHOLECYSTECTOMY  2007   LEFT HEART CATH AND CORS/GRAFTS ANGIOGRAPHY N/A 10/07/2016   Procedure: Left Heart Cath and Cors/Grafts Angiography;  Surgeon: Troy Sine, MD;  Location: [place] CV [facility];  Service: Cardiovascular;  Laterality: N/A;   LEFT HEART CATH AND  CORS/GRAFTS ANGIOGRAPHY N/A 06/05/2020   Procedure: LEFT HEART CATH AND CORS/GRAFTS ANGIOGRAPHY;  Surgeon: Lorretta Harp, MD;  Location: [place] CV LAB;  Service: Cardiovascular;  Laterality: N/A;   LEFT HEART CATH AND CORS/GRAFTS ANGIOGRAPHY N/A 11/26/2020   Procedure: LEFT HEART CATH AND CORS/GRAFTS ANGIOGRAPHY;  Surgeon: Troy Sine, MD;  Location: [place] CV LAB;  Service: Cardiovascular;  Laterality: N/A;   PARTIAL LARYNGECTOMY  1998   TRACHEOSTOMY  03/13/2018   "@ [facility]"   TRANSCAROTID ARTERY REVASCULARIZATION  Left 08/17/2022   Procedure: Transcarotid Artery Revascularization;  Surgeon: Broadus John, MD;  Location: [facility];  Service: Vascular;  Laterality: Left;    Allergies  Allergen Reactions   Nifedical Xl [Nifedipine] Other (See Comments)    Unknown reaction   Ativan [Lorazepam] Other (See Comments)    Altered mental state   Penicillins Hives    Has patient had a PCN reaction causing immediate rash, facial/tongue/throat swelling, SOB or lightheadedness with hypotension: Yes Has patient had a PCN reaction causing severe rash involving mucus membranes or skin necrosis: No Has patient had a PCN reaction that required hospitalization No Has patient had a PCN reaction occurring within the last 10 years: No If all of the above answers are "NO", then may proceed with Cephalosporin use.  HAS TOLERATED: cephalexin   Sulfacetamide Other (See Comments)    Unknown reaction   Sulfonamide Derivatives Other (See Comments)    Unknown reaction   Crestor [Rosuvastatin] Itching, Swelling and Rash   Erythromycin Rash   Firvanq [Vancomycin] Itching   Statins Itching, Swelling and Rash    Rash on his trunk and arms, swelling in his lips    Current Outpatient Medications  Medication Sig Dispense Refill   acetaminophen (TYLENOL) 500 MG tablet Take 500 mg by mouth every 6 (six) hours as needed for headache, fever, moderate pain or mild pain.     amLODipine (NORVASC) 10 MG tablet Take 1 tablet (10 mg total) by mouth daily. 90 tablet 3   apixaban (ELIQUIS) 2.5 MG TABS tablet Take 1 tablet (2.5 mg total) by mouth 2 (two) times daily. 120 tablet 0   aspirin EC 81 MG tablet Take 1 tablet (81 mg total) by mouth daily. Swallow whole. 30 tablet 1   carvedilol (COREG) 12.5 MG tablet Take 1.5 tablets (18.75 mg total) by mouth 2 (two) times daily. 270 tablet 3   clopidogrel (PLAVIX) 75 MG tablet Take 1 tablet (75 mg total) by mouth daily. 30 tablet 1   Evolocumab (REPATHA SURECLICK) XX123456 MG/ML SOAJ Inject 140 mg into the skin every 14 (fourteen) days. 2 mL 11   ezetimibe (ZETIA) 10 MG tablet Take 1 tablet by mouth once daily 90  tablet 1   nitroGLYCERIN (NITROSTAT) 0.4 MG SL tablet Place 1 tablet (0.4 mg total) under the tongue every 5 (five) minutes as needed for chest pain. 25 tablet 3   pantoprazole (PROTONIX) 40 MG tablet Take 1 tablet by mouth once daily 90 tablet 2   PARoxetine (PAXIL) 20 MG tablet Take 20 mg by mouth daily.     spironolactone (ALDACTONE) 25 MG tablet Take 1 tablet (25 mg total) by mouth daily. 90 tablet 3   No current facility-administered medications for this visit.    Family History  Problem Relation Age of Onset   Dementia Mother    Heart disease Father    Cancer Brother    Heart disease Brother    Hyperlipidemia Brother    Cancer  Brother    Colon cancer Neg Hx     Social History   Socioeconomic History   Marital status: Married    Spouse name: Not on file   Number of children: Not on file   Years of education: Not on file   Highest education level: Not on file  Occupational History   Occupation: retired    Comment: Warden/ranger  Tobacco Use   Smoking status: Former    Packs/day: 1.00    Years: 15.00    Total pack years: 15.00    Types: Cigarettes    Start date: 04/22/1953    Quit date: 07/26/1970    Years since quitting: 52.1    Passive exposure: Never   Smokeless tobacco: Former    Types: Chew    Quit date: 05/05/2002  Vaping Use   Vaping Use: Never used  Substance and Sexual Activity   Alcohol use: Never    Alcohol/week: 0.0 standard drinks of alcohol   Drug use: Never   Sexual activity: Not Currently  Other Topics Concern   Not on file  Social History Narrative   Walks on the treadmill every other day at the [employer] for the last couple of weeks.         Social Determinants of Health   Financial Resource Strain: Not on file  Food Insecurity: No Food Insecurity (08/30/2022)   Hunger Vital Sign    Worried About Running Out of Food in the Last Year: Never true    Ran Out of Food in the Last Year: Never true  Transportation Needs: No Transportation Needs  (08/30/2022)   PRAPARE - Hydrologist (Medical): No    Lack of Transportation (Non-Medical): No  Physical Activity: Not on file  Stress: Not on file  Social Connections: Not on file  Intimate Partner Violence: Not At Risk (08/09/2022)   Humiliation, Afraid, Rape, and Kick questionnaire    Fear of Current or Ex-Partner: No    Emotionally Abused: No    Physically Abused: No    Sexually Abused: No     PHYSICAL EXAMINATION:  Today's Vitals   09/17/22 1245 09/17/22 1248  BP: (!) 149/85 (!) 154/89  Pulse: (!) 55   Resp: 20   Temp: 97.7 F (36.5 C)   TempSrc: Temporal   SpO2: 97%   Weight: 172 lb 8 oz (78.2 kg)   Height: '5\' 6"'$  (1.676 m)    Body mass index is 27.84 kg/m.   General:  WDWN in NAD; vital signs documented above Gait: Normal HENT: WNL, normocephalic; tracheostomy in place Pulmonary: normal non-labored breathing Neuro:  hand grips equal bilaterally; tongue is midline Incision: left neck incision is well healed.    Non-Invasive Vascular Imaging:   Carotid Duplex on 09/17/2022 Right:  50-75% stenosis CCA stent  and patent ICA stent without stenosis Left:  patent ICA stenosis   Previous Carotid duplex on 05/05/2021: Right: 50-75% mid CCA stent and patent  ICA stenosis Left:   80-99% ICA stenosis    ASSESSMENT/PLAN:: 82 y.o. male here for follow up carotid artery stenosis and s/p  left TCAR for symptomatic carotid artery stenosis on 08/17/2022 by Dr. Virl Cagey.    He has prior hx of right carotid stent 06/13/2012 by Dr. Trula Slade for symptomatic carotid stenosis, repeat right carotid stent 05/04/2018 for symptomatic carotid stenosis by Dr. Trula Slade.  -duplex today reveals right is unchanged and left ICA stent is patent without stenosis.  He did have a transient  episode of right hand numbness that happened right after he got home and no further episodes.   -discussed s/s of stroke with pt and he understands should he develop any of these sx, he  will go to the nearest ER or call 911. -pt will f/u in 9 months with carotid duplex-they do prefer follow up in [place] if this is possible -pt will call sooner should he have any issues. -continue statin and plavix.  Since he is a month out, ok to stop asa per Dr. Virl Cagey.    Leontine Locket, [facility] [phone]  Clinic MD:  Virl Cagey

## 2022-09-18 ENCOUNTER — Other Ambulatory Visit: Payer: Self-pay

## 2022-09-18 DIAGNOSIS — I6523 Occlusion and stenosis of bilateral carotid arteries: Secondary | ICD-10-CM

## 2022-09-30 NOTE — Progress Notes (Signed)
[facility] [address] [place]. [place] [address] [phone]       HOSPITAL FOLLOW UP NOTE  [name] Date of Birth:  24-Oct-1940 Medical Record Number:  [id number]   Reason for Referral:  hospital stroke follow up    SUBJECTIVE:   CHIEF COMPLAINT:  Chief Complaint  Patient presents with   Follow-up    RM 8 with spouse Kenneth [name] Pt is well and stable, no concerns. Numbness has resolved.     HPI:   [name] is a 82 y.o. 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 He did have left eye ptosis and left hand numbness the day prior but fully resolved.  Stroke workup revealed left MCA scattered infarcts, artery to artery embolic secondary to left ICA critical stenosis as evidenced on CTA head/neck s/p left TCAR on 1/24 by Dr. Virl Cagey.  Recommended outpatient VVS f/u.  Restarted Eliquis and Plavix as well as Zetia.  Evaluated by therapies and discharged home without therapy needs.   Today, 10/04/2022, patient is being seen for hospital f/u accompanied by his wife who provides history. Has been doing well since discharge, denies new stroke/TIA symptoms and denies residual stroke deficits. Did have one episode of left hand numbness shortly after discharge but resolved quickly without any recurrent episodes. He does have some cognitive delay but has been gradually improving. Has returned back to all prior activities.  Compliant on Eliquis, Plavix, Zetia and recently started Repatha Blood pressure today 148/78 Routinely follows with PCP, cardiology and vascular surgery      PERTINENT IMAGING  Per hospitalization 08/15/2022 -08/19/2022 CT no acute finding CTA  head and neck - Critical stenosis of the left proximal ICA in the neck with trace (approximately 1 mm diameter) luminal opacification. Findings are progressed from the prior. Right common carotid artery and internal carotid artery stent. Moderate to severe stenosis of the common carotid artery stent with approximately 60-70% stenosis. 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 Moderate right intracranial ICA stenosis. Moderate left and mild right P2 PCA stenosis. CTP - 0/127 MRI - Numerous foci of acute infarction in the left cerebral hemisphere at the frontoparietal vertex consistent with watershed distribution infarctions.  2D Echo  EF 55-60% LDL 32 HgbA1c 6.4 UDS neg    ROS:   14 system review of systems performed and negative with exception of those listed in HPI  PMH:  Past Medical History:  Diagnosis Date   Acute ST elevation myocardial infarction (STEMI) of inferior wall ([place]) 03/01/2020   [facility]   Anxiety    Arthritis    Back (05/04/2018)   Carotid artery disease ([place])    a. 04/2018 s/p R carotid stenting. b. L TCAR 07/2022.   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   PAF (paroxysmal  atrial fibrillation) ([place])    Peripheral vascular disease ([place])    Pneumonia 11/11/2021   RBBB    Sinus bradycardia    Stroke [facility])    "he's had several little strokes; many that he wasn't aware of" (05/04/2018)   TIA (transient ischemic attack)    Tobacco abuse    Tracheal stenosis     PSH:  Past Surgical History:  Procedure Laterality Date   CAROTID STENT Right 06-13-12;  05/04/2018   CAROTID STENT INSERTION N/A 06/13/2012   Procedure: CAROTID STENT INSERTION;  Surgeon: Serafina Mitchell, MD;  Location: [facility] CATH LAB;  Service: Cardiovascular;  Laterality: N/A;   CATARACT EXTRACTION, BILATERAL Bilateral    COLONOSCOPY  11/10/2011   Dr. [name]: hemorrhoids, tubular adenoma   CORNEAL TRANSPLANT Bilateral    CORONARY ARTERY BYPASS GRAFT  ~ 2003   "CABG X3"   CORONARY BALLOON ANGIOPLASTY N/A 11/26/2020   Procedure: CORONARY BALLOON ANGIOPLASTY;  Surgeon: Troy Sine, MD;  Location: [facility] CV LAB;  Service: Cardiovascular;  Laterality: N/A;   CORONARY STENT INTERVENTION N/A 06/05/2020   Procedure: CORONARY STENT INTERVENTION;  Surgeon: Lorretta Harp, MD;  Location: [place] CV LAB;  Service: Cardiovascular;  Laterality: N/A;   EYE SURGERY     INSERTION OF RETROGRADE CAROTID STENT Right 05/04/2018   Procedure: INSERTION OF RIGHT  CAROTID STENT using an ABBOT- XACT carotid stent system;  Surgeon: Serafina Mitchell, MD;  Location: [facility];  Service: Vascular;  Laterality: Right;   LAPAROSCOPIC CHOLECYSTECTOMY  2007   LEFT HEART CATH AND CORS/GRAFTS ANGIOGRAPHY N/A 10/07/2016   Procedure: Left Heart Cath and Cors/Grafts Angiography;  Surgeon: Troy Sine, MD;  Location: [place] CV LAB;  Service: Cardiovascular;  Laterality: N/A;   LEFT HEART CATH AND CORS/GRAFTS ANGIOGRAPHY N/A 06/05/2020   Procedure: LEFT HEART CATH AND CORS/GRAFTS ANGIOGRAPHY;  Surgeon: Lorretta Harp, MD;  Location: [facility] CV LAB;  Service: Cardiovascular;  Laterality: N/A;   LEFT HEART CATH AND CORS/GRAFTS ANGIOGRAPHY N/A 11/26/2020   Procedure: LEFT HEART CATH AND CORS/GRAFTS ANGIOGRAPHY;  Surgeon: Troy Sine, MD;  Location: [facility] CV LAB;  Service: Cardiovascular;  Laterality: N/A;   PARTIAL LARYNGECTOMY  1998   TRACHEOSTOMY  03/13/2018   "@ [facility]"   TRANSCAROTID ARTERY REVASCULARIZATION  Left 08/17/2022   Procedure: Transcarotid Artery Revascularization;  Surgeon:  Broadus John, MD;  Location: [facility] OR;  Service: Vascular;  Laterality: Left;    Social History:  Social History   Socioeconomic History   Marital status: Married    Spouse name: Not on file   Number of children: Not on file   Years of education: Not on file   Highest education level: Not on file  Occupational History   Occupation: retired    Comment: Warden/ranger  Tobacco Use   Smoking status: Former    Packs/day: 1.00    Years: 15.00    Total pack years: 15.00    Types: Cigarettes    Start date: 04/22/1953    Quit date: 07/26/1970    Years since quitting: 52.2    Passive exposure: Never   Smokeless tobacco: Former    Types: Chew    Quit date: 05/05/2002  Vaping Use   Vaping Use: Never used  Substance and Sexual Activity   Alcohol use: Never    Alcohol/week: 0.0 standard drinks of alcohol   Drug use: Never   Sexual activity: Not Currently  Other Topics Concern   Not on  file  Social History Narrative   Walks on the treadmill every other day at the [facility] for the last couple of weeks.         Social Determinants of Health   Financial Resource Strain: Not on file  Food Insecurity: No Food Insecurity (08/30/2022)   Hunger Vital Sign    Worried About Running Out of Food in the Last Year: Never true    Ran Out of Food in the Last Year: Never true  Transportation Needs: No Transportation Needs (08/30/2022)   PRAPARE - Hydrologist (Medical): No    Lack of Transportation (Non-Medical): No  Physical Activity: Not on file  Stress: Not on file  Social Connections: Not on file  Intimate Partner Violence: Not At Risk (08/09/2022)   Humiliation, Afraid, Rape, and Kick questionnaire    Fear of Current or Ex-Partner: No    Emotionally Abused: No    Physically Abused: No    Sexually Abused: No    Family History:  Family History  Problem Relation Age of Onset   Dementia Mother    Heart disease Father    Cancer Brother    Heart disease Brother     Hyperlipidemia Brother    Cancer Brother    Colon cancer Neg Hx     Medications:   Current Outpatient Medications on File Prior to Visit  Medication Sig Dispense Refill   acetaminophen (TYLENOL) 500 MG tablet Take 500 mg by mouth every 6 (six) hours as needed for headache, fever, moderate pain or mild pain.     amLODipine (NORVASC) 10 MG tablet Take 1 tablet (10 mg total) by mouth daily. 90 tablet 3   apixaban (ELIQUIS) 2.5 MG TABS tablet Take 1 tablet (2.5 mg total) by mouth 2 (two) times daily. 180 tablet 1   aspirin EC 81 MG tablet Take 1 tablet (81 mg total) by mouth daily. Swallow whole. 30 tablet 1   carvedilol (COREG) 12.5 MG tablet Take 1.5 tablets (18.75 mg total) by mouth 2 (two) times daily. 270 tablet 3   clopidogrel (PLAVIX) 75 MG tablet Take 1 tablet (75 mg total) by mouth daily. 30 tablet 1   Evolocumab (REPATHA SURECLICK) [id number] MG/ML SOAJ Inject 140 mg into the skin every 14 (fourteen) days. 2 mL 11   ezetimibe (ZETIA) 10 MG tablet Take 1 tablet by mouth once daily 90 tablet 1   nitroGLYCERIN (NITROSTAT) 0.4 MG SL tablet Place 1 tablet (0.4 mg total) under the tongue every 5 (five) minutes as needed for chest pain. 25 tablet 3   pantoprazole (PROTONIX) 40 MG tablet Take 1 tablet by mouth once daily 90 tablet 2   PARoxetine (PAXIL) 20 MG tablet Take 20 mg by mouth daily.     spironolactone (ALDACTONE) 25 MG tablet Take 1 tablet (25 mg total) by mouth daily. 90 tablet 3   No current facility-administered medications on file prior to visit.    Allergies:   Allergies  Allergen Reactions   Nifedical Xl [Nifedipine] Other (See Comments)    Unknown reaction   Ativan [Lorazepam] Other (See Comments)    Altered mental state   Penicillins Hives    Has patient had a PCN reaction causing immediate rash, facial/tongue/throat swelling, SOB or lightheadedness with hypotension: Yes Has patient had a PCN reaction causing severe rash involving mucus membranes or skin necrosis: No Has  patient had a PCN reaction that required hospitalization No Has patient had a PCN reaction occurring within  the last 10 years: No If all of the above answers are "NO", then may proceed with Cephalosporin use.  HAS TOLERATED: cephalexin   Sulfacetamide Other (See Comments)    Unknown reaction   Sulfonamide Derivatives Other (See Comments)    Unknown reaction   Crestor [Rosuvastatin] Itching, Swelling and Rash   Erythromycin Rash   Firvanq [Vancomycin] Itching   Statins Itching, Swelling and Rash    Rash on his trunk and arms, swelling in his lips      OBJECTIVE:  Physical Exam  Vitals:   10/04/22 0956  BP: (!) 148/78  Pulse: 69  Weight: 178 lb (80.7 kg)  Height: '5\' 6"'$  (1.676 m)   Body mass index is 28.73 kg/m. No results found.   General: well developed, well nourished, seated, in no evident distress HEENT: head normocephalic and atraumatic.  trach in place Cardiovascular: regular rate and rhythm, no murmurs Musculoskeletal: no deformity Skin:  no rash/petichiae Vascular:  Normal pulses all extremities   Neurologic Exam Mental Status: Awake and fully alert. limited speech, can speak some words when covering trach otherwise wife provides history. Oriented to place and time. Recent memory mildly impaired and remote memory intact. Mood and affect appropriate.  Cranial Nerves: Fundoscopic exam reveals sharp disc margins. Pupils equal, briskly reactive to light. Extraocular movements full without nystagmus. Visual fields full to confrontation. Hearing intact. Facial sensation intact. Face, tongue, palate moves normally and symmetrically.  Motor: Normal bulk and tone. Normal strength in all tested extremity muscles Sensory.: intact to touch , pinprick , position and vibratory sensation.  Coordination: Rapid alternating movements normal in all extremities. Finger-to-nose and heel-to-shin performed accurately bilaterally. Gait and Station: Arises from chair without difficulty.  Stance is normal. Gait demonstrates normal stride length and balance without use of AD.  Reflexes: 1+ and symmetric. Toes downgoing.     NIHSS  0 Modified Rankin  0      ASSESSMENT: CORD STRAUGHAN is a 82 y.o. year old male with left MCA scattered infarcts on 08/15/2022, artery to artery embolic secondary to left ICA critical stenosis s/p left TCAR on 08/18/2022. Vascular risk factors include HTN, HLD, CAD/non-STEMI s/p CABG, A-fib on Eliquis, bilateral carotid stenosis s/p R ICA and CCA stenting in 2013 and repeat stenting 04/2018, hx of TIA 11/2020 and prior strokes on imaging.      PLAN:  Left MCA scattered infarcts:  Residual deficit: has recovered well with only residual mild cognitive delay.  Discussed typical recovery time. Not currently interested in doing cognitive therapy but will call office if interested in the future. Continue Eliquis '5mg'$  twice daily and Plavix and Zetia '10mg'$  daily and Repatha for secondary stroke prevention and per cardiology recommendation Discussed secondary stroke prevention measures and importance of close PCP follow up for aggressive stroke risk factor management including BP goal<130/90, HLD with LDL goal<70 and DM with A1c.<7 .  Stroke labs 07/2022: LDL 32, A1c 6.4 I have gone over the pathophysiology of stroke, warning signs and symptoms, risk factors and their management in some detail with instructions to go to the closest emergency room for symptoms of concern.  Bilateral carotid artery stenosis: Continue to follow with VVS, repeat duplex 09/17/2022 showed patent left ICA stent. Plans on repeat duplex in 9 months    Doing well from stroke standpoint without further recommendations and risk factors are managed by PCP. She may follow up PRN, as usual for our patients who are strictly being followed for stroke. If any new neurological  issues should arise, request PCP place referral for evaluation by one of our neurologists. Thank you.     CC:   [facility] provider: Dr. Leonie Man PCP: [name], MD    I spent 46 minutes of face-to-face and non-face-to-face time with patient and wife.  This included previsit chart review including review of recent hospitalization, lab review, study review, electronic health record documentation, patient education regarding recent stroke including etiology, secondary stroke prevention measures and importance of managing stroke risk factors,  and answered all other questions to patient satisfaction   Frann Rider, AGNP-BC  [facility] [address] [place], [place] [address]  Phone [phone] Fax [phone] Note: This document was prepared with digital dictation and possible smart phrase technology. Any transcriptional errors that result from this process are unintentional.

## 2022-10-01 ENCOUNTER — Other Ambulatory Visit: Payer: Self-pay

## 2022-10-01 MED ORDER — APIXABAN 2.5 MG PO TABS: 2.5000 mg | ORAL_TABLET | Freq: Two times a day (BID) | ORAL | 1 refills | Status: DC

## 2022-10-01 NOTE — Telephone Encounter (Signed)
Pt last saw Melina Copa, PA on 08/31/22, last labs 09/15/22 Creat 1.51, age 82, weight 78.2kg, based on specified criteria pt is on appropriate dosage of Eliquis 2.'5mg'$  BID for afib.  Will refill rx.

## 2022-10-04 ENCOUNTER — Telehealth: Payer: Self-pay | Admitting: Physician Assistant

## 2022-10-04 ENCOUNTER — Other Ambulatory Visit: Payer: Self-pay | Admitting: *Deleted

## 2022-10-04 ENCOUNTER — Encounter: Payer: Self-pay | Admitting: Adult Health

## 2022-10-04 ENCOUNTER — Ambulatory Visit: Payer: Medicare HMO | Admitting: Adult Health

## 2022-10-04 VITALS — BP 148/78 | HR 69 | Ht 66.0 in | Wt 178.0 lb

## 2022-10-04 DIAGNOSIS — I63232 Cerebral infarction due to unspecified occlusion or stenosis of left carotid arteries: Secondary | ICD-10-CM | POA: Diagnosis not present

## 2022-10-04 DIAGNOSIS — Z09 Encounter for follow-up examination after completed treatment for conditions other than malignant neoplasm: Secondary | ICD-10-CM | POA: Diagnosis not present

## 2022-10-04 MED ORDER — CLOPIDOGREL BISULFATE 75 MG PO TABS: 75.0000 mg | ORAL_TABLET | Freq: Every day | ORAL | 3 refills | Status: DC

## 2022-10-04 NOTE — Telephone Encounter (Signed)
Patient has seen PCP for bp. Patient is supposed to have lab work completed in the next couple of weeks per spouse.   Medication refill sent in for Plavix 75 mg tablets- 90 w/ 3 r/f

## 2022-10-04 NOTE — Patient Instructions (Addendum)
Please let me know if you would like to work with a speech therapist  doing cognitive therapy   Continue clopidogrel 75 mg daily and Eliquis   and Repatha and Zetia  for secondary stroke prevention  Continue to follow up with PCP regarding atorvastatin and cholesterol management  Maintain strict control of hypertension with blood pressure goal below 130/90 and cholesterol with LDL cholesterol (bad cholesterol) goal below 70 mg/dL.   Signs of a Stroke? Follow the BEFAST method:  Balance Watch for a sudden loss of balance, trouble with coordination or vertigo Eyes Is there a sudden loss of vision in one or both eyes? Or double vision?  Face: Ask the person to smile. Does one side of the face droop or is it numb?  Arms: Ask the person to raise both arms. Does one arm drift downward? Is there weakness or numbness of a leg? Speech: Ask the person to repeat a simple phrase. Does the speech sound slurred/strange? Is the person confused ? Time: If you observe any of these signs, call 911.       Thank you for coming to see [place] at [facility]. I hope we have been able to provide you high quality care today.  You may receive a patient satisfaction survey over the next few weeks. We would appreciate your feedback and comments so that we may continue to improve ourselves and the health of our patients.    Stroke Prevention Some medical conditions and lifestyle choices can lead to a higher risk for a stroke. You can help to prevent a stroke by eating healthy foods and exercising. It also helps to not smoke and to manage any health problems you may have. How can this condition affect me? A stroke is an emergency. It should be treated right away. A stroke can lead to brain damage or threaten your life. There is a better chance of surviving and getting better after a stroke if you get medical help right away. What can increase my risk? The following medical conditions may increase  your risk of a stroke: Diseases of the heart and blood vessels (cardiovascular disease). High blood pressure (hypertension). Diabetes. High cholesterol. Sickle cell disease. Problems with blood clotting. Being very overweight. Sleeping problems (obstructivesleep apnea). Other risk factors include: Being older than age 73. A history of blood clots, stroke, or mini-stroke (TIA). Race, ethnic background, or a family history of stroke. Smoking or using tobacco products. Taking birth control pills, especially if you smoke. Heavy alcohol and drug use. Not being active. What actions can I take to prevent this? Manage your health conditions High cholesterol. Eat a healthy diet. If this is not enough to manage your cholesterol, you may need to take medicines. Take medicines as told by your doctor. High blood pressure. Try to keep your blood pressure below 130/80. If your blood pressure cannot be managed through a healthy diet and regular exercise, you may need to take medicines. Take medicines as told by your doctor. Ask your doctor if you should check your blood pressure at home. Have your blood pressure checked every year. Diabetes. Eat a healthy diet and get regular exercise. If your blood sugar (glucose) cannot be managed through diet and exercise, you may need to take medicines. Take medicines as told by your doctor. Talk to your doctor about getting checked for sleeping problems. Signs of a problem can include: Snoring a lot. Feeling very tired. Make sure that you manage any other conditions you have.  Nutrition  Follow instructions from your doctor about what to eat or drink. You may be told to: Eat and drink fewer calories each day. Limit how much salt (sodium) you use to 1,500 milligrams (mg) each day. Use only healthy fats for cooking, such as olive oil, canola oil, and sunflower oil. Eat healthy foods. To do this: Choose foods that are high in fiber. These include whole  grains, and fresh fruits and vegetables. Eat at least 5 servings of fruits and vegetables a day. Try to fill one-half of your plate with fruits and vegetables at each meal. Choose low-fat (lean) proteins. These include low-fat cuts of meat, chicken without skin, fish, tofu, beans, and nuts. Eat low-fat dairy products. Avoid foods that: Are high in salt. Have saturated fat. Have trans fat. Have cholesterol. Are processed or pre-made. Count how many carbohydrates you eat and drink each day. Lifestyle If you drink alcohol: Limit how much you have to: 0-1 drink a day for women who are not pregnant. 0-2 drinks a day for men. Know how much alcohol is in your drink. In the [place] one drink equals one 12 oz bottle of beer (365m), one 5 oz glass of wine (1447m, or one 1 oz glass of hard liquor (4423m Do not smoke or use any products that have nicotine or tobacco. If you need help quitting, ask your doctor. Avoid secondhand smoke. Do not use drugs. Activity  Try to stay at a healthy weight. Get at least 30 minutes of exercise on most days, such as: Fast walking. Biking. Swimming. Medicines Take over-the-counter and prescription medicines only as told by your doctor. Avoid taking birth control pills. Talk to your doctor about the risks of taking birth control pills if: You are over 35 22ars old. You smoke. You get very bad headaches. You have had a blood clot. Where to find more information American Stroke Association: www.strokeassociation.org Get help right away if: You or a loved one has any signs of a stroke. "BE FAST" is an easy way to remember the warning signs: B - Balance. Dizziness, sudden trouble walking, or loss of balance. E - Eyes. Trouble seeing or a change in how you see. F - Face. Sudden weakness or loss of feeling of the face. The face or eyelid may droop on one side. A - Arms. Weakness or loss of feeling in an arm. This happens all of a sudden and most often on one  side of the body. S - Speech. Sudden trouble speaking, slurred speech, or trouble understanding what people say. T - Time. Time to call emergency services. Write down what time symptoms started. You or a loved one has other signs of a stroke, such as: A sudden, very bad headache with no known cause. Feeling like you may vomit (nausea). Vomiting. A seizure. These symptoms may be an emergency. Get help right away. Call your local emergency services (911 in the [place] Do not wait to see if the symptoms will go away. Do not drive yourself to the hospital. Summary You can help to prevent a stroke by eating healthy, exercising, and not smoking. It also helps to manage any health problems you have. Do not smoke or use any products that contain nicotine or tobacco. Get help right away if you or a loved one has any signs of a stroke. This information is not intended to replace advice given to you by your health care provider. Make sure you discuss any questions you have with your health  care provider. Document Revised: 01/24/2020 Document Reviewed: 02/11/2020 Elsevier Patient Education  [place].

## 2022-10-04 NOTE — Telephone Encounter (Signed)
Received following msg from neurology NP: "Frann Rider, NP  Anallely Rosell, Nedra Hai, PA-C; Arnoldo Lenis, MD -Good morning! I saw Mr. Rentschler for hospital stroke follow up today. His wife mentioned being out of Plavix (was only provided with 1 mo supply recently). Wondering if this can be sent in for them - she is requesting 90 day supply. Thank you!"  Triage team, please send in Plavix '75mg'$  daily refills to pt's requested pharmacy, [facility] for 1 yr. (Given >1 yr from last PCI and concomitant Eliquis use would not reload, OK to just restart '75mg'$  once daily ASAP.)  As per 09/15/22 lab note, they were going to contact PCP for recheck of low sodium level and BP check. Please ensure that he has done so and get a copy of those results. If he hasn't done so and would like to have it repeated in our office, OK to obtain f/u BMET but would still f/u PCP for this level as he has been on spironolactone for many years and would be unusual to see a random drop. BPs still 140s by followup visits with VVS + neurology, would recommend tighter goal. Let us know if we can be of any assistance with BP in the meantime if he was unable to get in to see PCP for this. If they need our input would ensure they enacted the present medication list as outlined.

## 2022-10-04 NOTE — Progress Notes (Signed)
Plavix refilled as requested per Dr. [name].

## 2022-10-05 NOTE — Progress Notes (Signed)
I agree with the above plan 

## 2022-10-29 ENCOUNTER — Ambulatory Visit: Payer: PRIVATE HEALTH INSURANCE | Attending: Cardiology | Admitting: Cardiology

## 2022-10-29 ENCOUNTER — Encounter: Payer: Self-pay | Admitting: Cardiology

## 2022-10-29 VITALS — BP 122/70 | HR 58 | Ht 66.0 in | Wt 177.0 lb

## 2022-10-29 DIAGNOSIS — E782 Mixed hyperlipidemia: Secondary | ICD-10-CM | POA: Diagnosis not present

## 2022-10-29 DIAGNOSIS — I10 Essential (primary) hypertension: Secondary | ICD-10-CM | POA: Diagnosis not present

## 2022-10-29 DIAGNOSIS — I2510 Atherosclerotic heart disease of native coronary artery without angina pectoris: Secondary | ICD-10-CM

## 2022-10-29 NOTE — Patient Instructions (Signed)
Medication Instructions:   Your physician recommends that you continue on your current medications as directed. Please refer to the Current Medication list given to you today.  Labwork: None today  Testing/Procedures: None today  Follow-Up: 6 months  Any Other Special Instructions Will Be Listed Below (If Applicable).   Call us Monday with your home blood pressure readings.  If you need a refill on your cardiac medications before your next appointment, please call your pharmacy.

## 2022-10-29 NOTE — Progress Notes (Signed)
Clinical Summary Mr. Spark is a 82 y.o.male seen today for follow up of the following medical problems.    1. CAD   - prior CABG in 2003, repeat cath 2007 with patent grafts.   - 07/2011 MPI low risk study, no ischemia.   - echo 04/2012 LVEF 65-60%, grade II diastolic dysfunction  - 10/2013 echo showed LVEF 65-70%, grade II diastolic dysfunction.  - 11/2013 MPI no ischemia.  - 10/2017 echo LVEF 60-65%, no WMAs, normal diastolic function     - 12/2017 nuclear stress: no ischemia  - 02/2020 inferior STEMI, received DES to RCA 05/2020 NSTEMI, DES to RCA   11/2020 NSTEMI, peak trop 162 11/2020 with RCA ISR, had PTCA. Recs for indefinite DAPT  (on high dose ASA per neuro given prior CVA, carotid disease) 11/2020 echo: LVEF 65-70%, no WMAs, normal RV,    08/2021 STEMI, 3 DES to the RCA in [place].  - Echocardiogram during recent admission showed his EF was mildly reduced at 45 to 50   Jan 2024 echo LVEF 55-60%    - no chest pains. Chronic stable SOB - compliant with meds   2. Hyperlipidemia   - reported history of rash on statin, he reports tried multiple and all caused rash  - he is on zetia, praluent   Jan 2024 TC 93 TG 97HDL 34 LDL 40     3. HTN   - recent high bp's - home bp's 130s/140s/80s    4. Carotid stenosis   - prior carotid stenting   - followed by vascular, recs have been for DAPT   - 11/2020 carotid US: LICA 70-99%, IRCA 1-49% with patent right CCA stent - recent vascualr appt 01/2021, plans for medical therapy due to risk for intervention and patient preference.     - found to have critical ICA stenosis during admit with CVA, underwent left sided TCAR Jan 23,2024   5. SOB - remote 10-15 year history of smoking. Used to work in [employer]. - chronic DOE, mainly with walking up incline.    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   - followed by pulm Notes mention CT imaging consistent with slowly resolving ALI, lung imaging has shown consistent interstitial findings.        6. Throat cancer  s/p radiation and surgery - followed at [facility]   - has tracheostomy   7. CVA - admit 10/2016 with small cortical based infarcts in right parietal lobe, associated hemorrhage - right ICA 50-69% 10/2017   - admit 11/2020 with TIA - was changed to high dose ASA at that time  - admit Jan 2024 with CVA - found to have critical ICA stenosis, underwent left sided TCAR Jan 23,2024 - plans were for ASA/plavix/eliquis 1 month,   8. Afib - diagnosed during admission Jan 2024 - started on eliqius at that time - age 62, Cr 1.51 Past Medical History:  Diagnosis Date   Acute ST elevation myocardial infarction (STEMI) of inferior wall (HCC) 03/01/2020   [facility] [place]   Anxiety    Arthritis    Back (05/04/2018)   Carotid artery disease (HCC)    a. 04/2018 s/p R carotid stenting. b. L TCAR 07/2022.   Chronic lower back pain    "have it at night" (05/04/2018)   CKD (chronic kidney disease),  stage III [facility])    Coronary artery disease    CVA (cerebral vascular accident) (HCC)    Depression    GERD (gastroesophageal reflux disease)    High triglycerides    History of kidney stones    Hyperlipidemia LDL goal <70    Hypertension    Hypertensive crisis 08/08/2022   Laryngeal carcinoma (HCC) 1998   PAF (paroxysmal atrial fibrillation) (HCC)    Peripheral vascular disease (HCC)    Pneumonia 11/11/2021   RBBB    Sinus bradycardia    Stroke [facility])    "he's had several little strokes; many that he wasn't aware of" (05/04/2018)   TIA (transient ischemic attack)    Tobacco abuse    Tracheal stenosis      Allergies  Allergen Reactions   Nifedical Xl [Nifedipine] Other (See Comments)    Unknown reaction   Ativan [Lorazepam] Other (See Comments)    Altered mental state   Penicillins  Hives    Has patient had a PCN reaction causing immediate rash, facial/tongue/throat swelling, SOB or lightheadedness with hypotension: Yes Has patient had a PCN reaction causing severe rash involving mucus membranes or skin necrosis: No Has patient had a PCN reaction that required hospitalization No Has patient had a PCN reaction occurring within the last 10 years: No If all of the above answers are "NO", then may proceed with Cephalosporin use.  HAS TOLERATED: cephalexin   Sulfacetamide Other (See Comments)    Unknown reaction   Sulfonamide Derivatives Other (See Comments)    Unknown reaction   Crestor [Rosuvastatin] Itching, Swelling and Rash   Erythromycin Rash   Firvanq [Vancomycin] Itching   Statins Itching, Swelling and Rash    Rash on his trunk and arms, swelling in his lips     Current Outpatient Medications  Medication Sig Dispense Refill   acetaminophen (TYLENOL) 500 MG tablet Take 500 mg by mouth every 6 (six) hours as needed for headache, fever, moderate pain or mild pain.     amLODipine (NORVASC) 10 MG tablet Take 1 tablet (10 mg total) by mouth daily. 90 tablet 3   apixaban (ELIQUIS) 2.5 MG TABS tablet Take 1 tablet (2.5 mg total) by mouth 2 (two) times daily. 180 tablet 1   carvedilol (COREG) 12.5 MG tablet Take 1.5 tablets (18.75 mg total) by mouth 2 (two) times daily. 270 tablet 3   clopidogrel (PLAVIX) 75 MG tablet Take 1 tablet (75 mg total) by mouth daily. 90 tablet 3   Evolocumab (REPATHA SURECLICK) 140 MG/ML SOAJ Inject 140 mg into the skin every 14 (fourteen) days. 2 mL 11   ezetimibe (ZETIA) 10 MG tablet Take 1 tablet by mouth once daily 90 tablet 1   nitroGLYCERIN (NITROSTAT) 0.4 MG SL tablet Place 1 tablet (0.4 mg total) under the tongue every 5 (five) minutes as needed for chest pain. 25 tablet 3   pantoprazole (PROTONIX) 40 MG tablet Take 1 tablet by mouth once daily 90 tablet 2   PARoxetine (PAXIL) 20 MG tablet Take 20 mg by mouth daily.      spironolactone (ALDACTONE) 25 MG tablet Take 1 tablet (25 mg total) by mouth daily. 90 tablet 3   No current facility-administered medications for this visit.     Past Surgical History:  Procedure Laterality Date   CAROTID STENT Right 06-13-12; 05/04/2018   CAROTID STENT INSERTION N/A 06/13/2012   Procedure: CAROTID STENT INSERTION;  Surgeon: Nada Libman, MD;  Location[facility] CATH LAB;  Service: Cardiovascular;  Laterality: N/A;   CATARACT EXTRACTION, BILATERAL Bilateral    COLONOSCOPY  11/10/2011   Dr. Jena Gaussourk: hemorrhoids, tubular adenoma   CORNEAL TRANSPLANT Bilateral    CORONARY ARTERY BYPASS GRAFT  ~ 2003   "CABG X3"   CORONARY BALLOON ANGIOPLASTY N/A 11/26/2020   Procedure: CORONARY BALLOON ANGIOPLASTY;  Surgeon: Lennette BihariKelly, Thomas A, MD;  Location: [facility] INVASIVE CV LAB;  Service: Cardiovascular;  Laterality: N/A;   CORONARY STENT INTERVENTION N/A 06/05/2020   Procedure: CORONARY STENT INTERVENTION;  Surgeon: Runell GessBerry, Fortino Haag J, MD;  Location: [facility] INVASIVE CV LAB;  Service: Cardiovascular;  Laterality: N/A;   EYE SURGERY     INSERTION OF RETROGRADE CAROTID STENT Right 05/04/2018   Procedure: INSERTION OF RIGHT  CAROTID STENT using an ABBOT- XACT carotid stent system;  Surgeon: Nada LibmanBrabham, Vance W, MD;  Location: [facility] OR;  Service: Vascular;  Laterality: Right;   LAPAROSCOPIC CHOLECYSTECTOMY  2007   LEFT HEART CATH AND CORS/GRAFTS ANGIOGRAPHY N/A 10/07/2016   Procedure: Left Heart Cath and Cors/Grafts Angiography;  Surgeon: Lennette Biharihomas A Kelly, MD;  Location: [facility] INVASIVE CV LAB;  Service: Cardiovascular;  Laterality: N/A;   LEFT HEART CATH AND CORS/GRAFTS ANGIOGRAPHY N/A 06/05/2020   Procedure: LEFT HEART CATH AND CORS/GRAFTS ANGIOGRAPHY;  Surgeon: Runell GessBerry, Careli Luzader J, MD;  Location: [facility] INVASIVE CV LAB;  Service: Cardiovascular;  Laterality: N/A;   LEFT HEART CATH AND CORS/GRAFTS ANGIOGRAPHY N/A 11/26/2020   Procedure: LEFT HEART CATH AND CORS/GRAFTS ANGIOGRAPHY;  Surgeon: Lennette BihariKelly, Thomas A, MD;  Location: [facility]  INVASIVE CV LAB;  Service: Cardiovascular;  Laterality: N/A;   PARTIAL LARYNGECTOMY  1998   TRACHEOSTOMY  03/13/2018   "@ [facility]"   TRANSCAROTID ARTERY REVASCULARIZATION  Left 08/17/2022   Procedure: Transcarotid Artery Revascularization;  Surgeon: Victorino Sparrowobins, Joshua E, MD;  [facility];  Service: Vascular;  Laterality: Left;     Allergies  Allergen Reactions   Nifedical Xl [Nifedipine] Other (See Comments)    Unknown reaction   Ativan [Lorazepam] Other (See Comments)    Altered mental state   Penicillins Hives    Has patient had a PCN reaction causing immediate rash, facial/tongue/throat swelling, SOB or lightheadedness with hypotension: Yes Has patient had a PCN reaction causing severe rash involving mucus membranes or skin necrosis: No Has patient had a PCN reaction that required hospitalization No Has patient had a PCN reaction occurring within the last 10 years: No If all of the above answers are "NO", then may proceed with Cephalosporin use.  HAS TOLERATED: cephalexin   Sulfacetamide Other (See Comments)    Unknown reaction   Sulfonamide Derivatives Other (See Comments)    Unknown reaction   Crestor [Rosuvastatin] Itching, Swelling and Rash   Erythromycin Rash   Firvanq [Vancomycin] Itching   Statins Itching, Swelling and Rash    Rash on his trunk and arms, swelling in his lips      Family History  Problem Relation Age of Onset   Dementia Mother    Heart disease Father    Cancer Brother    Heart disease Brother    Hyperlipidemia Brother    Cancer Brother    Colon cancer Neg Hx      Social History Mr. Idolina PrimerUnderwood reports that he quit smoking about 52 years ago. His smoking use included cigarettes. He started smoking about 69 years ago. He has a 15.00 pack-year smoking history. He has never been exposed to tobacco smoke. He quit smokeless tobacco use about 20 years ago.  His smokeless tobacco use included chew. Mr.  Idolina PrimerUnderwood reports no history of alcohol  use.   Review of Systems CONSTITUTIONAL: No weight loss, fever, chills, weakness or fatigue.  HEENT: Eyes: No visual loss, blurred vision, double vision or yellow sclerae.No hearing loss, sneezing, congestion, runny nose or sore throat.  SKIN: No rash or itching.  CARDIOVASCULAR: per hpi RESPIRATORY: No shortness of breath, cough or sputum.  GASTROINTESTINAL: No anorexia, nausea, vomiting or diarrhea. No abdominal pain or blood.  GENITOURINARY: No burning on urination, no polyuria NEUROLOGICAL: No headache, dizziness, syncope, paralysis, ataxia, numbness or tingling in the extremities. No change in bowel or bladder control.  MUSCULOSKELETAL: No muscle, back pain, joint pain or stiffness.  LYMPHATICS: No enlarged nodes. No history of splenectomy.  PSYCHIATRIC: No history of depression or anxiety.  ENDOCRINOLOGIC: No reports of sweating, cold or heat intolerance. No polyuria or polydipsia.  Marland Kitchen.   Physical Examination Today's Vitals   10/29/22 1101  BP: 122/70  Pulse: (!) 58  SpO2: 98%  Weight: 177 lb (80.3 kg)  Height: 5\' 6"  (1.676 m)   Body mass index is 28.57 kg/m.  Gen: resting comfortably, no acute distress HEENT: no scleral icterus, pupils equal round and reactive, no palptable cervical adenopathy,  CV: RRR, no m/rg, no jvd Resp: Clear to auscultation bilaterally GI: abdomen is soft, non-tender, non-distended, normal bowel sounds, no hepatosplenomegaly MSK: extremities are warm, no edema.  Skin: warm, no rash Neuro:  no focal deficits Psych: appropriate affect   Diagnostic Studies   07/2011 MPI   Tomographic views were obtained using the short axis, vertical long axis, and horizontal long axis planes. No significant, reversible perfusion defects are noted to indicate ischemia.  Gated imaging reveals an EDV of 59, ESV of 18, T I D ratio of 0.80, and LVEF of 69%.  IMPRESSION: Low risk exercise/Lexiscan Myoview as outlined. No diagnostic ST- segment changes or  arrhythmias were noted. There is evidence of soft tissue attenuation, however no frank scar or ischemia. LVEF is normal at 69%.   04/2012 Echo   LVEF 65-70%, grade II diastolic dysfunction,     11/05/13 Echo Study Conclusions  - Left ventricle: The cavity size was normal. Wall thickness was increased in a pattern of mild LVH. Systolic function was vigorous. The estimated ejection fraction was in the range of 65% to 70%. Wall motion was normal; there were no regional wall motion abnormalities. Features are consistent with a pseudonormal left ventricular filling pattern, with concomitant abnormal relaxation and increased filling pressure (grade 2 diastolic dysfunction). - Aortic valve: Mildly calcified annulus. Trileaflet. Trivial regurgitation. - Mitral valve: Calcified annulus. Trivial regurgitation. - Left atrium: The atrium was mildly dilated. - Right ventricle: Systolic function was low normal. - Right atrium: Central venous pressure: 3mm Hg (est). - Atrial septum: No defect or patent foramen ovale was identified. - Tricuspid valve: Trivial regurgitation. - Pulmonary arteries: Systolic pressure could not be accurately estimated. - Pericardium, extracardiac: There was no pericardial effusion. Impressions:  - Normal LV chamber size with mild LVH and LVEF 65-70%, grade 2 diastolic dysfunction. MIld left atrial enlargement. Mild MAC. Low normal RV contraction. Unable to assess PASP - trivial tricuspid regurgitation.   11/2013 Lexiscan MPI IMPRESSION: 1.  Negative Lexiscan MPI for ischemia   2.  Normal left ventricular systolic function, LVEF 69%   3.  Low risk study for major cardiac events.     09/2016 cath LM lesion, 50 %stenosed. Prox LAD to Mid LAD lesion, 70 %stenosed. Ost 1st Mrg lesion, 100 %stenosed. RIMA and  is normal in caliber and anatomically normal. RPDA-2 lesion, 50 %stenosed. RPDA-1 lesion, 60 %stenosed. LIMA. The left ventricular systolic function  is normal. LV end diastolic pressure is normal.   Normal LV function without focal segmental wall motion abnormalities.   Multivessel  native CAD with 50% ostial stenosis of the LAD and diffuse 70% mid stenoses, occlusion of the OM1 vessel of the circumflex, and diffuse 60 and 50% PDA stenoses in the small caliber PDA vessel.   Patent sequential LIMA graft supplying a twin like diagonal and LAD system system.   Patent RIMA graft supplying the circumflex marginal vessel.   RECOMMENDATION: Medical therapy.     10/2017 echo Study Conclusions   - Left ventricle: The cavity size was normal. Wall thickness was   normal. Systolic function was normal. The estimated ejection   fraction was in the range of 60% to 65%. Wall motion was normal;   there were no regional wall motion abnormalities. Left   ventricular diastolic function parameters were normal. - Aortic valve: Mildly calcified annulus. Trileaflet; mildly   thickened leaflets. Valve area (VTI): 3.01 cm^2. Valve area   (Vmax): 2.24 cm^2. Valve area (Vmean): 2.3 cm^2. - Mitral valve: Mildly calcified annulus. Mildly thickened leaflets   . - Left atrium: The atrium was mildly dilated.   12/2017 nuclear stress No diagnostic ST segment changes to indicate ischemia. No significant myocardial perfusion defects to indicate scar or ischemia. This is a low risk study. Nuclear stress EF: 67%.     11/2020 cath Previously placed Prox RCA stent (unknown type) is widely patent. Ost RCA lesion is 30% stenosed. Mid RCA lesion is 95% stenosed. Ost LM to Mid LM lesion is 80% stenosed. Ost Cx to Prox Cx lesion is 95% stenosed. Post intervention, there is a 0% residual stenosis. Ost LAD to Prox LAD lesion is 80% stenosed. Prox LAD lesion is 90% stenosed. 2nd Diag lesion is 80% stenosed. RV Neetu Carrozza lesion is 80% stenosed.   Severe native CAD with previously noted high-grade stenoses in the left main, proximal and mid LAD,and proximal circumflex.     Mild 30% proximal narrowing in the RCA prior to the proximal patent  stent but evidence for 95 to 99% in-stent restenosis in the mid RCA stent.   Patent sequential LIMA graft supplying the diagonal 1 and mid LAD.   Patent RIMA graft supplying the OM vessel.   Successful PCI to the mid RCA stent utilizing Wolverine cutting balloon 2.0 x 10 mm, 3.0 x 15 mm and ultimate 3.25 x 15 mm noncompliant balloon dilatations with the 95% stenosis being reduced to 0%.   LVEDP 21 mm Hg.   POST CATH RECOMMENDATION: Continue DAPT indefinitely.  Medical therapy for concomitant CAD.  Continue aggressive lipid-lowering therapy with target LDL less than 70 and optimal blood pressure control.   11/2020 carotid US IMPRESSION: 1. Severe (70-99%) stenosis proximal left internal carotid artery secondary to bulky, heterogeneous and calcified atherosclerotic plaque. 2. Mild (1-49%) stenosis proximal right internal carotid artery secondary to heterogenous atherosclerotic plaque. 3. Patent right common carotid artery stent. 4. Vertebral arteries are patent with normal antegrade flow.   11/2020 echo IMPRESSIONS     1. Left ventricular ejection fraction, by estimation, is 65 to 70%. The  left ventricle has normal function. The left ventricle has no regional  wall motion abnormalities. Left ventricular diastolic parameters were  normal.   2. Right ventricular systolic function is normal. The right ventricular  size is normal. There is normal  pulmonary artery systolic pressure. The  estimated right ventricular systolic pressure is 21.7 mmHg.   3. Left atrial size was mildly dilated.   4. The mitral valve is grossly normal. Trivial mitral valve  regurgitation.   5. The aortic valve is tricuspid. There is moderate calcification of the  aortic valve. Aortic valve regurgitation is not visualized. Mild to  moderate aortic valve sclerosis/calcification is present, without any  evidence of aortic stenosis. Aortic  valve  mean gradient measures 7.0 mmHg.   6. The inferior vena cava is normal in size with greater than 50%  respiratory variability, suggesting right atrial pressure of 3 mmHg.   Assessment and Plan   1. CAD   -Most recent MI in [place] with 3 stents to RCA 08/2021 - Has been committed to lifelong DAPT. Since afib diagnosis started on eliquis we have committed to indefinitie eliquis and plavix - continue current meds, no recent symptoms   2. Hyperlipidemia   - continue zetia and praluent.  -  lipids at goal   3. HTN   -at goal today, family reports some high bp's at home but have not been checking after sitting for 5 minutes and a little unsure of the exact numbers  - call us Monday with updated home bp's   4. Carotid stenosis   - per vascular, recent TCAR     Antoine Poche, M.D.

## 2022-11-01 ENCOUNTER — Ambulatory Visit: Payer: Medicare HMO | Admitting: Physician Assistant

## 2022-12-04 ENCOUNTER — Encounter (HOSPITAL_COMMUNITY): Payer: Self-pay

## 2022-12-04 ENCOUNTER — Emergency Department (HOSPITAL_COMMUNITY)
Admission: EM | Admit: 2022-12-04 | Discharge: 2022-12-04 | Disposition: A | Discharge status: Home / Self Care | Payer: Medicare HMO | Attending: Emergency Medicine | Admitting: Emergency Medicine

## 2022-12-04 ENCOUNTER — Emergency Department (HOSPITAL_COMMUNITY): Payer: Medicare HMO

## 2022-12-04 ENCOUNTER — Other Ambulatory Visit: Payer: Self-pay

## 2022-12-04 DIAGNOSIS — Z7901 Long term (current) use of anticoagulants: Secondary | ICD-10-CM | POA: Insufficient documentation

## 2022-12-04 DIAGNOSIS — Z7902 Long term (current) use of antithrombotics/antiplatelets: Secondary | ICD-10-CM | POA: Insufficient documentation

## 2022-12-04 DIAGNOSIS — S40819A Abrasion of unspecified upper arm, initial encounter: Secondary | ICD-10-CM | POA: Diagnosis not present

## 2022-12-04 DIAGNOSIS — R69 Illness, unspecified: Secondary | ICD-10-CM

## 2022-12-04 DIAGNOSIS — R42 Dizziness and giddiness: Secondary | ICD-10-CM | POA: Diagnosis present

## 2022-12-04 DIAGNOSIS — W19XXXA Unspecified fall, initial encounter: Secondary | ICD-10-CM | POA: Insufficient documentation

## 2022-12-04 DIAGNOSIS — S80819A Abrasion, unspecified lower leg, initial encounter: Secondary | ICD-10-CM | POA: Insufficient documentation

## 2022-12-04 LAB — CBC WITH DIFFERENTIAL/PLATELET
Abs Immature Granulocytes: 0.07 10*3/uL (ref 0.00–0.07)
Basophils Absolute: 0.1 10*3/uL (ref 0.0–0.1)
Basophils Relative: 1 %
Eosinophils Absolute: 0.2 10*3/uL (ref 0.0–0.5)
Eosinophils Relative: 2 %
HCT: 41.3 % (ref 39.0–52.0)
Hemoglobin: 13.3 g/dL (ref 13.0–17.0)
Immature Granulocytes: 1 %
Lymphocytes Relative: 19 %
Lymphs Abs: 2.1 10*3/uL (ref 0.7–4.0)
MCH: 28.6 pg (ref 26.0–34.0)
MCHC: 32.2 g/dL (ref 30.0–36.0)
MCV: 88.8 fL (ref 80.0–100.0)
Monocytes Absolute: 0.7 10*3/uL (ref 0.1–1.0)
Monocytes Relative: 6 %
Neutro Abs: 7.9 10*3/uL — ABNORMAL HIGH (ref 1.7–7.7)
Neutrophils Relative %: 71 %
Platelets: 283 10*3/uL (ref 150–400)
RBC: 4.65 MIL/uL (ref 4.22–5.81)
RDW: 14.5 % (ref 11.5–15.5)
WBC: 11 10*3/uL — ABNORMAL HIGH (ref 4.0–10.5)
nRBC: 0 % (ref 0.0–0.2)

## 2022-12-04 LAB — COMPREHENSIVE METABOLIC PANEL
ALT: 20 U/L (ref 0–44)
AST: 23 U/L (ref 15–41)
Albumin: 4 g/dL (ref 3.5–5.0)
Alkaline Phosphatase: 122 U/L (ref 38–126)
Anion gap: 11 (ref 5–15)
BUN: 20 mg/dL (ref 8–23)
CO2: 22 mmol/L (ref 22–32)
Calcium: 8.9 mg/dL (ref 8.9–10.3)
Chloride: 102 mmol/L (ref 98–111)
Creatinine, Ser: 1.57 mg/dL — ABNORMAL HIGH (ref 0.61–1.24)
GFR, Estimated: 43 mL/min — ABNORMAL LOW (ref >60)
Glucose, Bld: 100 mg/dL — ABNORMAL HIGH (ref 70–99)
Potassium: 4.3 mmol/L (ref 3.5–5.1)
Sodium: 135 mmol/L (ref 135–145)
Total Bilirubin: 0.9 mg/dL (ref 0.3–1.2)
Total Protein: 7.9 g/dL (ref 6.5–8.1)

## 2022-12-04 LAB — URINALYSIS, ROUTINE W REFLEX MICROSCOPIC
Bacteria, UA: NONE SEEN
Bilirubin Urine: NEGATIVE
Glucose, UA: NEGATIVE mg/dL
Ketones, ur: NEGATIVE mg/dL
Leukocytes,Ua: NEGATIVE
Nitrite: NEGATIVE
Protein, ur: NEGATIVE mg/dL
Specific Gravity, Urine: 1.002 — ABNORMAL LOW (ref 1.005–1.030)
pH: 7 (ref 5.0–8.0)

## 2022-12-04 LAB — TROPONIN I (HIGH SENSITIVITY): Troponin I (High Sensitivity): 14 ng/L (ref <18)

## 2022-12-04 NOTE — ED Provider Notes (Signed)
[place] EMERGENCY DEPARTMENT AT [facility] Provider Note   CSN: [id number] Arrival date & time: 12/04/22  1212     History {Add pertinent medical, surgical, social history, OB history to HPI:1} Chief Complaint  Patient presents with   Dizziness    Kenneth [name] is a 82 y.o. male.  HPI     Home Medications Prior to Admission medications   Medication Sig Start Date End Date Taking? Authorizing Provider  acetaminophen (TYLENOL) 500 MG tablet Take 500 mg by mouth every 6 (six) hours as needed for headache, fever, moderate pain or mild pain.    [provider]  amLODipine (NORVASC) 10 MG tablet Take 1 tablet (10 mg total) by mouth daily. 09/08/22 09/08/23  Dunn, Tacey Ruiz, PA-C  apixaban (ELIQUIS) 2.5 MG TABS tablet Take 1 tablet (2.5 mg total) by mouth 2 (two) times daily. 10/01/22   Antoine Poche, MD  carvedilol (COREG) 12.5 MG tablet Take 1.5 tablets (18.75 mg total) by mouth 2 (two) times daily. 09/16/22 09/11/23  Laurann Montana, PA-C  clopidogrel (PLAVIX) 75 MG tablet Take 1 tablet (75 mg total) by mouth daily. 10/04/22   Dunn, Tacey Ruiz, PA-C  Evolocumab (REPATHA SURECLICK) 140 MG/ML SOAJ Inject 140 mg into the skin every 14 (fourteen) days. 09/15/22   Antoine Poche, MD  ezetimibe (ZETIA) 10 MG tablet Take 1 tablet by mouth once daily 09/15/22   Antoine Poche, MD  nitroGLYCERIN (NITROSTAT) 0.4 MG SL tablet Place 1 tablet (0.4 mg total) under the tongue every 5 (five) minutes as needed for chest pain. 01/31/17 05/28/23  Jodelle Gross, NP  pantoprazole (PROTONIX) 40 MG tablet Take 1 tablet by mouth once daily 07/28/21   Antoine Poche, MD  PARoxetine (PAXIL) 20 MG tablet Take 20 mg by mouth daily.    [provider]  spironolactone (ALDACTONE) 25 MG tablet Take 1 tablet (25 mg total) by mouth daily. 09/16/22 09/11/23  Laurann Montana, PA-C      Allergies    Nifedical xl [nifedipine], Ativan [lorazepam], Penicillins, Sulfacetamide, Sulfonamide  derivatives, Crestor [rosuvastatin], Erythromycin, Firvanq [vancomycin], and Statins    Review of Systems   Review of Systems  Physical Exam Updated Vital Signs BP (!) 156/73 (BP Location: Left Arm)   Pulse 60   Temp 98 F (36.7 C) (Oral)   Resp 18   Ht 5\' 6"  (1.676 m)   Wt 80.3 kg   SpO2 100%   BMI 28.57 kg/m  Physical Exam  ED Results / Procedures / Treatments   Labs (all labs ordered are listed, but only abnormal results are displayed) Labs Reviewed - No data to display  EKG None  Radiology No results found.  Procedures Procedures  {Document cardiac monitor, telemetry assessment procedure when appropriate:1}  Medications Ordered in ED Medications - No data to display  ED Course/ Medical Decision Making/ A&P   {   Click here for ABCD2, HEART and other calculatorsREFRESH Note before signing :1}                          Medical Decision Making  ***  {Document critical care time when appropriate:1} {Document review of labs and clinical decision tools ie heart score, Chads2Vasc2 etc:1}  {Document your independent review of radiology images, and any outside records:1} {Document your discussion with family members, caretakers, and with consultants:1} {Document social determinants of health affecting pt's care:1} {Document your decision making why or  why not admission, treatments were needed:1} Final Clinical Impression(s) / ED Diagnoses Final diagnoses:  None    Rx / DC Orders ED Discharge Orders     None

## 2022-12-04 NOTE — Discharge Instructions (Addendum)
1.  Your CT scan does not show any bleeding around the brain. 2.  Due to prior stroke you may have more dizziness than previously did.  You may also have less strength in your arms and legs.  It is very important that you use a walker or a support and if you feel little lightheaded, sit down immediately and elevate your legs. 3.  You need close monitoring by your doctors.  Call your cardiologist and your family doctor to schedule a recheck early next week. 4.  Return to emergency department if your symptoms are worsening or you are developing new concerning symptoms.

## 2022-12-04 NOTE — ED Triage Notes (Signed)
Pt reports he has been having intermittent episodes of dizziness over the past several weeks and it caused him to fall today and he has a wound on his left upper arm and left thigh.

## 2022-12-28 ENCOUNTER — Other Ambulatory Visit: Payer: Self-pay | Admitting: Internal Medicine

## 2022-12-28 DIAGNOSIS — R310 Gross hematuria: Secondary | ICD-10-CM

## 2023-01-03 ENCOUNTER — Ambulatory Visit (HOSPITAL_BASED_OUTPATIENT_CLINIC_OR_DEPARTMENT_OTHER)
Admission: RE | Admit: 2023-01-03 | Discharge: 2023-01-03 | Disposition: A | Discharge status: Home / Self Care | Payer: Medicare HMO | Source: Ambulatory Visit | Attending: Internal Medicine | Admitting: Internal Medicine

## 2023-01-03 DIAGNOSIS — R310 Gross hematuria: Secondary | ICD-10-CM | POA: Diagnosis present

## 2023-01-11 ENCOUNTER — Ambulatory Visit (INDEPENDENT_AMBULATORY_CARE_PROVIDER_SITE_OTHER): Payer: Medicare HMO | Admitting: Urology

## 2023-01-11 ENCOUNTER — Encounter: Payer: Self-pay | Admitting: Urology

## 2023-01-11 VITALS — BP 138/64 | HR 73

## 2023-01-11 DIAGNOSIS — R339 Retention of urine, unspecified: Secondary | ICD-10-CM | POA: Diagnosis not present

## 2023-01-11 DIAGNOSIS — R310 Gross hematuria: Secondary | ICD-10-CM

## 2023-01-11 DIAGNOSIS — N35011 Post-traumatic bulbous urethral stricture: Secondary | ICD-10-CM | POA: Diagnosis not present

## 2023-01-11 DIAGNOSIS — N3289 Other specified disorders of bladder: Secondary | ICD-10-CM | POA: Diagnosis not present

## 2023-01-11 LAB — URINALYSIS, ROUTINE W REFLEX MICROSCOPIC
Bilirubin, UA: NEGATIVE
Glucose, UA: NEGATIVE
Ketones, UA: NEGATIVE
Leukocytes,UA: NEGATIVE
Nitrite, UA: NEGATIVE
Specific Gravity, UA: 1.01 (ref 1.005–1.030)
Urobilinogen, Ur: 0.2 mg/dL (ref 0.2–1.0)
pH, UA: 6.5 (ref 5.0–7.5)

## 2023-01-11 LAB — MICROSCOPIC EXAMINATION
Bacteria, UA: NONE SEEN
RBC, Urine: >30 /hpf — AB (ref 0–2)

## 2023-01-11 MED ORDER — CIPROFLOXACIN HCL 500 MG PO TABS
500.0000 mg | ORAL_TABLET | Freq: Once | ORAL | Status: AC
Start: 2023-01-11 — End: 2023-01-11
  Administered 2023-01-11: 500 mg via ORAL

## 2023-01-11 NOTE — Progress Notes (Signed)
H&P  Chief Complaint: Blood in urine  History of Present Illness: 82 year old male presents today with his son and wife.  He has had intermittent gross hematuria with dysuria for about a month.  He has multiple medical issues, is on both clopidogrel and Eliquis.  He underwent right carotid endarterectomy within the past 5 months.  Most recently had gross hematuria this morning.  He is a smoker.  He has had a laryngectomy as well as radiation to his neck.  He has a history of urethral stricture, previously treated in [place].  He has had Foley catheter placement several times with minimal difficulty.  Past Medical History:  Diagnosis Date   Acute ST elevation myocardial infarction (STEMI) of inferior wall (HCC) 03/01/2020   [facility] [place]   Anxiety    Arthritis    Back (05/04/2018)   Carotid artery disease (HCC)    a. 04/2018 s/p R carotid stenting. b. L TCAR 07/2022.   Chronic lower back pain    "have it at night" (05/04/2018)   CKD (chronic kidney disease), stage III (HCC)    Coronary artery disease    CVA (cerebral vascular accident) (HCC)    Depression    GERD (gastroesophageal reflux disease)    High triglycerides    History of kidney stones    Hyperlipidemia LDL goal <70    Hypertension    Hypertensive crisis 08/08/2022   Laryngeal carcinoma (HCC) 1998   PAF (paroxysmal atrial fibrillation) (HCC)    Peripheral vascular disease (HCC)    Pneumonia 11/11/2021   RBBB    Sinus bradycardia    Stroke [facility])    "he's had several little strokes; many that he wasn't aware of" (05/04/2018)   TIA (transient ischemic attack)    Tobacco abuse    Tracheal stenosis     Past Surgical History:  Procedure Laterality Date   CAROTID STENT Right 06-13-12; 05/04/2018   CAROTID STENT INSERTION N/A 06/13/2012   Procedure: CAROTID STENT INSERTION;  Surgeon: Nada Libman, MD;  Location: [facility] CATH LAB;  Service: Cardiovascular;  Laterality: N/A;   CATARACT EXTRACTION,  BILATERAL Bilateral    COLONOSCOPY  11/10/2011   Dr. Jena Gauss: hemorrhoids, tubular adenoma   CORNEAL TRANSPLANT Bilateral    CORONARY ARTERY BYPASS GRAFT  ~ 2003   "CABG X3"   CORONARY BALLOON ANGIOPLASTY N/A 11/26/2020   Procedure: CORONARY BALLOON ANGIOPLASTY;  Surgeon: Lennette Bihari, MD;  Location: [facility] INVASIVE CV LAB;  Service: Cardiovascular;  Laterality: N/A;   CORONARY STENT INTERVENTION N/A 06/05/2020   Procedure: CORONARY STENT INTERVENTION;  Surgeon: Runell Gess, MD;  Location: [facility] INVASIVE CV LAB;  Service: Cardiovascular;  Laterality: N/A;   EYE SURGERY     INSERTION OF RETROGRADE CAROTID STENT Right 05/04/2018   Procedure: INSERTION OF RIGHT  CAROTID STENT using an ABBOT- XACT carotid stent system;  Surgeon: Nada Libman, MD;  Location: [facility] OR;  Service: Vascular;  Laterality: Right;   LAPAROSCOPIC CHOLECYSTECTOMY  2007   LEFT HEART CATH AND CORS/GRAFTS ANGIOGRAPHY N/A 10/07/2016   Procedure: Left Heart Cath and Cors/Grafts Angiography;  Surgeon: Lennette Bihari, MD;  Location: [facility] INVASIVE CV LAB;  Service: Cardiovascular;  Laterality: N/A;   LEFT HEART CATH AND CORS/GRAFTS ANGIOGRAPHY N/A 06/05/2020   Procedure: LEFT HEART CATH AND CORS/GRAFTS ANGIOGRAPHY;  Surgeon: Runell Gess, MD;  Location: [facility] INVASIVE CV LAB;  Service: Cardiovascular;  Laterality: N/A;   LEFT HEART CATH AND CORS/GRAFTS ANGIOGRAPHY N/A 11/26/2020   Procedure: LEFT  HEART CATH AND CORS/GRAFTS ANGIOGRAPHY;  Surgeon: Lennette Bihari, MD;  Location: [facility] INVASIVE CV LAB;  Service: Cardiovascular;  Laterality: N/A;   PARTIAL LARYNGECTOMY  1998   TRACHEOSTOMY  03/13/2018   "@ [facility]"   TRANSCAROTID ARTERY REVASCULARIZATION  Left 08/17/2022   Procedure: Transcarotid Artery Revascularization;  Surgeon: Victorino Sparrow, MD;  Location: [facility] OR;  Service: Vascular;  Laterality: Left;    Home Medications:  Allergies as of 01/11/2023       Reactions   Nifedical Xl [nifedipine] Other (See Comments)   Unknown  reaction   Ativan [lorazepam] Other (See Comments)   Altered mental state   Penicillins Hives   Has patient had a PCN reaction causing immediate rash, facial/tongue/throat swelling, SOB or lightheadedness with hypotension: Yes Has patient had a PCN reaction causing severe rash involving mucus membranes or skin necrosis: No Has patient had a PCN reaction that required hospitalization No Has patient had a PCN reaction occurring within the last 10 years: No If all of the above answers are "NO", then may proceed with Cephalosporin use. HAS TOLERATED: cephalexin   Sulfacetamide Other (See Comments)   Unknown reaction   Sulfonamide Derivatives Other (See Comments)   Unknown reaction   Crestor [rosuvastatin] Itching, Swelling, Rash   Erythromycin Rash   Firvanq [vancomycin] Itching   Statins Itching, Swelling, Rash   Rash on his trunk and arms, swelling in his lips        Medication List        Accurate as of January 11, 2023  7:35 AM. If you have any questions, ask your nurse or doctor.          acetaminophen 500 MG tablet Commonly known as: TYLENOL Take 500 mg by mouth every 6 (six) hours as needed for headache, fever, moderate pain or mild pain.   amLODipine 10 MG tablet Commonly known as: NORVASC Take 1 tablet (10 mg total) by mouth daily.   apixaban 2.5 MG Tabs tablet Commonly known as: ELIQUIS Take 1 tablet (2.5 mg total) by mouth 2 (two) times daily.   carvedilol 12.5 MG tablet Commonly known as: COREG Take 1.5 tablets (18.75 mg total) by mouth 2 (two) times daily.   clopidogrel 75 MG tablet Commonly known as: PLAVIX Take 1 tablet (75 mg total) by mouth daily.   ezetimibe 10 MG tablet Commonly known as: ZETIA Take 1 tablet by mouth once daily   nitroGLYCERIN 0.4 MG SL tablet Commonly known as: Nitrostat Place 1 tablet (0.4 mg total) under the tongue every 5 (five) minutes as needed for chest pain.   pantoprazole 40 MG tablet Commonly known as: PROTONIX Take  1 tablet by mouth once daily   PARoxetine 20 MG tablet Commonly known as: PAXIL Take 20 mg by mouth daily.   Repatha SureClick 140 MG/ML Soaj Generic drug: Evolocumab Inject 140 mg into the skin every 14 (fourteen) days.   spironolactone 25 MG tablet Commonly known as: ALDACTONE Take 1 tablet (25 mg total) by mouth daily.        Allergies:  Allergies  Allergen Reactions   Nifedical Xl [Nifedipine] Other (See Comments)    Unknown reaction   Ativan [Lorazepam] Other (See Comments)    Altered mental state   Penicillins Hives    Has patient had a PCN reaction causing immediate rash, facial/tongue/throat swelling, SOB or lightheadedness with hypotension: Yes Has patient had a PCN reaction causing severe rash involving mucus membranes or skin necrosis: No Has patient had a  PCN reaction that required hospitalization No Has patient had a PCN reaction occurring within the last 10 years: No If all of the above answers are "NO", then may proceed with Cephalosporin use.  HAS TOLERATED: cephalexin   Sulfacetamide Other (See Comments)    Unknown reaction   Sulfonamide Derivatives Other (See Comments)    Unknown reaction   Crestor [Rosuvastatin] Itching, Swelling and Rash   Erythromycin Rash   Firvanq [Vancomycin] Itching   Statins Itching, Swelling and Rash    Rash on his trunk and arms, swelling in his lips    Family History  Problem Relation Age of Onset   Dementia Mother    Heart disease Father    Cancer Brother    Heart disease Brother    Hyperlipidemia Brother    Cancer Brother    Colon cancer Neg Hx     Social History:  reports that he quit smoking about 52 years ago. His smoking use included cigarettes. He started smoking about 69 years ago. He has a 15.00 pack-year smoking history. He has never been exposed to tobacco smoke. He quit smokeless tobacco use about 20 years ago.  His smokeless tobacco use included chew. He reports that he does not drink alcohol and does  not use drugs.  ROS: A complete review of systems was performed.  All systems are negative except for pertinent findings as noted.  Physical Exam:  Vital signs in last 24 hours: There were no vitals taken for this visit. Constitutional:  Alert and oriented, No acute distress Cardiovascular: Regular rate  Respiratory: Normal respiratory effor Genitourinary: Normal male phallus, testes are descended bilaterally and non-tender and without masses, scrotum is normal in appearance without lesions or masses, perineum is normal on inspection. Lymphatic: No lymphadenopathy Neurologic: Grossly intact, no focal deficits Psychiatric: Normal mood and affect  I have reviewed prior pt notes  I have reviewed notes from referring/previous physicians  I have reviewed urinalysis results  I have independently reviewed prior imaging--CT 1. Right middle lobe lung nodule. CT chest recommended for further evaluation. 2. Extensive changes atheromatous disease including possible right renal artery stenosis which might explain right renal atrophy. MRA or CTA evaluation may be helpful. 3. 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 4. Left kidney cyst. 5. Bilateral gynecomastia. 6. Urinary bladder was empty and there is a thickened wall which is nonspecific. Urinary bladder mucosal lesions cannot be excluded on this examination. Consider cystoscopic correlation.   I have reviewed prior PSA results  I have reviewed prior urine culture  Cystoscopy Procedure Note:  Indication: Hematuria  After informed consent and discussion of the procedure and its risks, RONDAL GRICE was positioned and prepped in the standard fashion.  Cystoscopy was performed with a flexible cystoscope.   Findings: Urethra: Bulbous urethral stricture, moderate, traversed with scope Prostate: Nonobstructive Bladder neck: Open Ureteral orifices: Normal bilaterally Bladder: 10 mm papillary lesion right trigonal region.  No  other significant bladder abnormalities noted  The patient tolerated the procedure well.    Impression/Assessment:  1.  Gross hematuria, on 2 anticoagulants, bladder tumor seen today  2.  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  Urethral stricture  Plan:  I have discussed further management with the patient, his wife and son.  I would recommend cystoscopy, bilateral retrograde pyelograms, Optilume dilation of urethral stricture as well as TURBT with gemcitabine.  I can do this in August, but I would recommend doing it sooner here.  I will discuss this  case with Dr. [name] and we will work on getting this scheduled.

## 2023-01-12 ENCOUNTER — Other Ambulatory Visit (HOSPITAL_COMMUNITY): Payer: Self-pay | Admitting: Internal Medicine

## 2023-01-12 DIAGNOSIS — R918 Other nonspecific abnormal finding of lung field: Secondary | ICD-10-CM

## 2023-01-17 ENCOUNTER — Telehealth: Payer: Self-pay

## 2023-01-17 NOTE — Telephone Encounter (Signed)
Patient's son called requesting and update on the status of the pending surgery clearance. He advised the clearance should be from Dr. Verna Czech office.    Thank you

## 2023-01-17 NOTE — Telephone Encounter (Signed)
I returned Kenneth [name]'s call regarding pt's surgery.  Son requested to schedule surgery with him, his call back is [phone].  He is aware I will reach out later this week with a surgery date.

## 2023-01-21 ENCOUNTER — Telehealth: Payer: Self-pay | Admitting: *Deleted

## 2023-01-21 ENCOUNTER — Telehealth: Payer: Self-pay

## 2023-01-21 NOTE — Telephone Encounter (Signed)
Patient asking when surgery can be scheduled. Also having severe pain and bleeding. Discuss to stop taking blood thinner medication.  Call son  [phone]   Or wife at [phone]

## 2023-01-21 NOTE — Telephone Encounter (Signed)
Callback team,  Can you contact requesting provider regarding holding Plavix. Patient is on both Eliquis and Plavix and clarification needed prior to making recommendations.  Thank you

## 2023-01-21 NOTE — Telephone Encounter (Signed)
   Pre-operative Risk Assessment    Patient Name: Kenneth [name]  DOB: [birth year] MRN: [id number]      Request for Surgical Clearance    Procedure:   CYSTOSCOPY, B/L RETROGRADE PYELOGRAMS, OPTILUME DILATION OF URETHRAL STRICTURE AS WELL AS TURBT WITH GEMCITABINE INSTILLATION  Date of Surgery:  Clearance 02/07/23                                 Surgeon:  DR. [name] Surgeon's Group or Practice Name:  [facility] [place]  Phone number:  [phone] Fax number:  [phone]   Type of Clearance Requested:   - Medical  - Pharmacy:  Hold Apixaban (Eliquis) x 2 DAYS PRIOR  AND 3 DAYS POST PROCEDURE   Type of Anesthesia:  General    Additional requests/questions:    Elpidio Anis   01/21/2023, 1:55 PM

## 2023-01-21 NOTE — Telephone Encounter (Signed)
I left a voicemail requesting a call back on Monday to confirm a possible surgery date for 07/29.  Patient will need cardio clearance prior to surgery.  Waiting for call back.

## 2023-01-21 NOTE — Telephone Encounter (Signed)
After hours PA called [place] back and said Dr. [name] asked for [place] to call the office back on Monday in regard to the medications to be held.

## 2023-01-21 NOTE — Telephone Encounter (Signed)
I spoke with Kenneth [name]. We have discussed possible surgery dates and 02/07/2023 was agreed upon by all parties. Patient given information about surgery date, what to expect pre-operatively and post operatively.    We discussed that a pre-op nurse will be calling to set up the pre-op visit that will take place prior to surgery. Informed patient that our office will communicate any additional care to be provided after surgery.    Patients questions or concerns were discussed during our call. Advised to call our office should there be any additional information, questions or concerns that arise. Patient verbalized understanding.

## 2023-01-21 NOTE — Telephone Encounter (Signed)
I called Dr. Dimas Millin office to confirm if they are needing both Plavix and Eliquis to be held. Only the Eliquis was listed on clearance form to be held. Verbal message was taken for the surgery scheduler to call back and confirm if Plavix needs to be held as well. I have give [phone] pre op line.

## 2023-01-24 NOTE — Telephone Encounter (Signed)
Please see telephone encounter

## 2023-01-24 NOTE — Telephone Encounter (Signed)
Open in error

## 2023-01-24 NOTE — Telephone Encounter (Signed)
Per Derinda Sis CMA: We received the clearance on this patient. The clearance stated that he needed to hold his eliquis. Our provider was wondering if you guys wanted to hold plavix also?   Me: I called and left a vm for the office to call back and let us know if Dr. [name] is going to want to hold both blood thinners, Plavix and Eliquis. Clearance request came over stating only Eliquis. We cannot proceed any further until we have complete information. I will fax these notes as FYI as well.

## 2023-01-25 ENCOUNTER — Telehealth: Payer: Self-pay

## 2023-01-25 ENCOUNTER — Telehealth: Payer: Self-pay | Admitting: *Deleted

## 2023-01-25 NOTE — Telephone Encounter (Signed)
I discussed the questions about medication holds with Dr. [name] today. Per Dr. [name], patient to hold Eliquis 3 days prior to surgery and may stay on plavix. I returned the call to [facility] pre op. No answer. Left detailed message in chart of medication instructions per Dr. [name].

## 2023-01-25 NOTE — Telephone Encounter (Signed)
-----   Message from Troy Sine, [place] sent at 01/24/2023  3:48 PM EDT ----- Regarding: medication Do patient need to hold Plavix and Eliqus prior to surgery. [facility] need this information to proceed with cardio clearance. [phone] [id number] ----- Message ----- From: Ferdinand Lango, RN Sent: 01/24/2023  11:06 AM EDT To: [facility]  triage

## 2023-01-25 NOTE — Telephone Encounter (Signed)
Patient with diagnosis of afib on Eliquis for anticoagulation.    Procedure: cystoscopy, TURBT Date of procedure: 02/07/23  CHA2DS2-VASc Score = 6  This indicates a 9.7% annual risk of stroke. The patient's score is based upon: CHF History: 0 HTN History: 1 Diabetes History: 0 Stroke History: 2 Vascular Disease History: 1 Age Score: 2 Gender Score: 0   Pt with recent CVA in January 2024, prior TIA in 2022 and prior CVA in 2018. Afib diagnosed during January 2024 admission, started on Eliquis at that time.   CrCl 33mL/min Platelet count 283K  Request is to hold Eliquis for a total of 6 days (2 days before procedure, day of, and 3 days post procedure). Prefer to minimize time off anticoag given pt's elevated CV risk. He has history of 3 CVAs, afib was diagnosed at time of most recent CVA in January 2024. Will defer to MD for input.  **This guidance is not considered finalized until pre-operative APP has relayed final recommendations.**

## 2023-01-25 NOTE — Telephone Encounter (Signed)
Dr. Dimas Millin office called back today and confirmed for our office per Dr. [name] the pt will only need to hold Eliquis and can continue Plavix for the surgery.

## 2023-01-25 NOTE — Telephone Encounter (Signed)
I s/w the pt's son who has scheduled a tele pre op appt for his dad (the pt). 01/31/23 at 10:20. Med rec and consent are done. Pt's son said to call his mom's # [phone] , though if unable to reach the pt pt's son said you can call him at [phone].

## 2023-01-25 NOTE — Telephone Encounter (Signed)
Pharmacy please advise on holding Eliquis prior to cystoscopy scheduled for 02/07/2023. Thank you.

## 2023-01-25 NOTE — Telephone Encounter (Signed)
I s/w the pt's son who has scheduled a tele pre op appt for his dad (the pt). 01/31/23 at 10:20. Med rec and consent are done. Pt's son said to call his mom's # [phone] , though if unable to reach the pt pt's son said you can call him at [phone].        Patient Consent for Virtual Visit        Kenneth [name] has provided verbal consent on 01/25/2023 for a virtual visit (video or telephone).   CONSENT FOR VIRTUAL VISIT FOR:  Kenneth [name]  By participating in this virtual visit I agree to the following:  I hereby voluntarily request, consent and authorize [facility] and its employed or contracted physicians, physician assistants, nurse practitioners or other licensed health care professionals (the Practitioner), to provide me with telemedicine health care services (the "Services") as deemed necessary by the treating Practitioner. I acknowledge and consent to receive the Services by the Practitioner via telemedicine. I understand that the telemedicine visit will involve communicating with the Practitioner through live audiovisual communication technology and the disclosure of certain medical information by electronic transmission. I acknowledge that I have been given the opportunity to request an in-person assessment or other available alternative prior to the telemedicine visit and am voluntarily participating in the telemedicine visit.  I understand that I have the right to withhold or withdraw my consent to the use of telemedicine in the course of my care at any time, without affecting my right to future care or treatment, and that the Practitioner or I may terminate the telemedicine visit at any time. I understand that I have the right to inspect all information obtained and/or recorded in the course of the telemedicine visit and may receive copies of available information for a reasonable fee.  I understand that some of the potential risks of receiving the Services via  telemedicine include:  Delay or interruption in medical evaluation due to technological equipment failure or disruption; Information transmitted may not be sufficient (e.g. poor resolution of images) to allow for appropriate medical decision making by the Practitioner; and/or  In rare instances, security protocols could fail, causing a breach of personal health information.  Furthermore, I acknowledge that it is my responsibility to provide information about my medical history, conditions and care that is complete and accurate to the best of my ability. I acknowledge that Practitioner's advice, recommendations, and/or decision may be based on factors not within their control, such as incomplete or inaccurate data provided by me or distortions of diagnostic images or specimens that may result from electronic transmissions. I understand that the practice of medicine is not an exact science and that Practitioner makes no warranties or guarantees regarding treatment outcomes. I acknowledge that a copy of this consent can be made available to me via my patient portal [facility] MyChart), or I can request a printed copy by calling the office of [facility].    I understand that my insurance will be billed for this visit.   I have read or had this consent read to me. I understand the contents of this consent, which adequately explains the benefits and risks of the Services being provided via telemedicine.  I have been provided ample opportunity to ask questions regarding this consent and the Services and have had my questions answered to my satisfaction. I give my informed consent for the services to be provided through the use of telemedicine in my medical care

## 2023-01-29 NOTE — Progress Notes (Unsigned)
Virtual Visit via Telephone Note   Because of Volney Buter Moulder's co-morbid illnesses, he is at least at moderate risk for complications without adequate follow up.  This format is felt to be most appropriate for this patient at this time.  The patient did not have access to video technology/had technical difficulties with video requiring transitioning to audio format only (telephone).  All issues noted in this document were discussed and addressed.  No physical exam could be performed with this format.  Please refer to the patient's chart for his consent to telehealth for [facility].  Evaluation Performed:  Preoperative cardiovascular risk assessment _____________   Date:  01/29/2023   Patient ID:  Kenneth [name], DOB 1941-07-02, MRN [id number] Patient Location:  Home Provider location:   Office  Primary Care Provider:  [name], MD Primary Cardiologist:  [name], MD  Chief Complaint / Patient Profile   82 y.o. y/o male with a h/o CAD s/p CABG 2003, inferior STEMI 02/2020 s/p DES to RCA, NSTEMI 06/05/20 with DES to RCA, NSTEMI 11/2020 s/p PTCA to [facility], inferior STEMI 08/2021 s/p DESx3 to RCA stent; PAF, carotid artery disease s/p stenting on the right 2013 and left TCAR January 2024, TIA 2022, CVA 2018 and 2024, sinus bradycardia, hypertension, hyperlipidemia, laryngeal cancer who is pending cystoscopy, b/l retrograde pyelograms, optilume dilation of urethral stricture as well as turbt with gemcitabine instillation by Dr. [name] and presents today for telephonic preoperative cardiovascular risk assessment.  History of Present Illness    Kenneth [name] is a 82 y.o. male who presents via audio/video conferencing for a telehealth visit today.  Pt was last seen in cardiology clinic on 10/29/2022 by Dr. Wyline Mood.  At that time Kenneth [name] was stable from a cardiac standpoint.  The patient is now pending procedure as outlined above. Since his last visit, he is doing well  from a cardiac standpoint. Patient denies shortness of breath or dyspnea on exertion. No chest pain, pressure, or tightness. Denies lower extremity edema, orthopnea, or PND. No palpitations. Discussed holding Eliquis as requested by surgeon (2 days prior, 3 days after) with pharmacy team. There is a high risk of stroke given patient's history of CVA x 3 with last one occurring in the setting of atrial fibrillation. Pharmacy team does not see the utility of a Lovenox bridge, as it does not solve the risk of holding 3 days after. Discussed these concerns with patient and his wife. Patient is in a lot of pain and he and his wife feel the risk does not outweigh the benefit of patient undergoing this procedure. Will reach out to Dr. Wyline Mood when he returns to the office later this week for any further recommendations.   Past Medical History    Past Medical History:  Diagnosis Date   Acute ST elevation myocardial infarction (STEMI) of inferior wall (HCC) 03/01/2020   [facility] [place]   Anxiety    Arthritis    Back (05/04/2018)   Carotid artery disease (HCC)    a. 04/2018 s/p R carotid stenting. b. L TCAR 07/2022.   Chronic lower back pain    "have it at night" (05/04/2018)   CKD (chronic kidney disease), stage III (HCC)    Coronary artery disease    CVA (cerebral vascular accident) (HCC)    Depression    GERD (gastroesophageal reflux disease)    High triglycerides    History of kidney stones    Hyperlipidemia LDL goal <70  Hypertension    Hypertensive crisis 08/08/2022   Laryngeal carcinoma (HCC) 1998   PAF (paroxysmal atrial fibrillation) (HCC)    Peripheral vascular disease (HCC)    Pneumonia 11/11/2021   RBBB    Sinus bradycardia    Stroke [facility])    "he's had several little strokes; many that he wasn't aware of" (05/04/2018)   TIA (transient ischemic attack)    Tobacco abuse    Tracheal stenosis    Past Surgical History:  Procedure Laterality Date   CAROTID STENT Right  06-13-12; 05/04/2018   CAROTID STENT INSERTION N/A 06/13/2012   Procedure: CAROTID STENT INSERTION;  Surgeon: Nada Libman, MD;  Location: [facility] CATH LAB;  Service: Cardiovascular;  Laterality: N/A;   CATARACT EXTRACTION, BILATERAL Bilateral    COLONOSCOPY  11/10/2011   Dr. Jena Gauss: hemorrhoids, tubular adenoma   CORNEAL TRANSPLANT Bilateral    CORONARY ARTERY BYPASS GRAFT  ~ 2003   "CABG X3"   CORONARY BALLOON ANGIOPLASTY N/A 11/26/2020   Procedure: CORONARY BALLOON ANGIOPLASTY;  Surgeon: Lennette Bihari, MD;  Location: [facility] INVASIVE CV LAB;  Service: Cardiovascular;  Laterality: N/A;   CORONARY STENT INTERVENTION N/A 06/05/2020   Procedure: CORONARY STENT INTERVENTION;  Surgeon: Runell Gess, MD;  Location: [facility] INVASIVE CV LAB;  Service: Cardiovascular;  Laterality: N/A;   EYE SURGERY     INSERTION OF RETROGRADE CAROTID STENT Right 05/04/2018   Procedure: INSERTION OF RIGHT  CAROTID STENT using an ABBOT- XACT carotid stent system;  Surgeon: Nada Libman, MD;  Location: [facility] OR;  Service: Vascular;  Laterality: Right;   LAPAROSCOPIC CHOLECYSTECTOMY  2007   LEFT HEART CATH AND CORS/GRAFTS ANGIOGRAPHY N/A 10/07/2016   Procedure: Left Heart Cath and Cors/Grafts Angiography;  Surgeon: Lennette Bihari, MD;  Location: [facility] INVASIVE CV LAB;  Service: Cardiovascular;  Laterality: N/A;   LEFT HEART CATH AND CORS/GRAFTS ANGIOGRAPHY N/A 06/05/2020   Procedure: LEFT HEART CATH AND CORS/GRAFTS ANGIOGRAPHY;  Surgeon: Runell Gess, MD;  Location: [facility] INVASIVE CV LAB;  Service: Cardiovascular;  Laterality: N/A;   LEFT HEART CATH AND CORS/GRAFTS ANGIOGRAPHY N/A 11/26/2020   Procedure: LEFT HEART CATH AND CORS/GRAFTS ANGIOGRAPHY;  Surgeon: Lennette Bihari, MD;  Location: [facility] INVASIVE CV LAB;  Service: Cardiovascular;  Laterality: N/A;   PARTIAL LARYNGECTOMY  1998   TRACHEOSTOMY  03/13/2018   "@ [facility]"   TRANSCAROTID ARTERY REVASCULARIZATION  Left 08/17/2022   Procedure: Transcarotid Artery Revascularization;   Surgeon: Victorino Sparrow, MD;  Location: [facility] OR;  Service: Vascular;  Laterality: Left;    Allergies  Allergies  Allergen Reactions   Nifedical Xl [Nifedipine] Other (See Comments)    Unknown reaction   Ativan [Lorazepam] Other (See Comments)    Altered mental state   Penicillins Hives    Has patient had a PCN reaction causing immediate rash, facial/tongue/throat swelling, SOB or lightheadedness with hypotension: Yes Has patient had a PCN reaction causing severe rash involving mucus membranes or skin necrosis: No Has patient had a PCN reaction that required hospitalization No Has patient had a PCN reaction occurring within the last 10 years: No If all of the above answers are "NO", then may proceed with Cephalosporin use.  HAS TOLERATED: cephalexin   Sulfacetamide Other (See Comments)    Unknown reaction   Sulfonamide Derivatives Other (See Comments)    Unknown reaction   Crestor [Rosuvastatin] Itching, Swelling and Rash   Erythromycin Rash   Firvanq [Vancomycin] Itching   Statins Itching, Swelling and Rash  Rash on his trunk and arms, swelling in his lips    Home Medications    Prior to Admission medications   Medication Sig Start Date End Date Taking? Authorizing Provider  acetaminophen (TYLENOL) 500 MG tablet Take 500 mg by mouth every 6 (six) hours as needed for headache, fever, moderate pain or mild pain.    [provider]  amLODipine (NORVASC) 10 MG tablet Take 1 tablet (10 mg total) by mouth daily. 09/08/22 09/08/23  Dunn, Tacey Ruiz, PA-C  apixaban (ELIQUIS) 2.5 MG TABS tablet Take 1 tablet (2.5 mg total) by mouth 2 (two) times daily. 10/01/22   Antoine Poche, MD  carvedilol (COREG) 12.5 MG tablet Take 1.5 tablets (18.75 mg total) by mouth 2 (two) times daily. 09/16/22 09/11/23  Laurann Montana, PA-C  clopidogrel (PLAVIX) 75 MG tablet Take 1 tablet (75 mg total) by mouth daily. 10/04/22   Dunn, Tacey Ruiz, PA-C  Evolocumab (REPATHA SURECLICK) 140 MG/ML SOAJ Inject  140 mg into the skin every 14 (fourteen) days. 09/15/22   Antoine Poche, MD  ezetimibe (ZETIA) 10 MG tablet Take 1 tablet by mouth once daily 09/15/22   Antoine Poche, MD  nitroGLYCERIN (NITROSTAT) 0.4 MG SL tablet Place 1 tablet (0.4 mg total) under the tongue every 5 (five) minutes as needed for chest pain. 01/31/17 05/28/23  Jodelle Gross, NP  pantoprazole (PROTONIX) 40 MG tablet Take 1 tablet by mouth once daily 07/28/21   Antoine Poche, MD  PARoxetine (PAXIL) 20 MG tablet Take 20 mg by mouth daily.    [provider]  spironolactone (ALDACTONE) 25 MG tablet Take 1 tablet (25 mg total) by mouth daily. 09/16/22 09/11/23  Laurann Montana, PA-C    Physical Exam    Vital Signs:  Kenneth [name] does not have vital signs available for review today.  Given telephonic nature of communication, physical exam is limited. AAOx3. NAD. Normal affect.  Speech and respirations are unlabored.  Accessory Clinical Findings    None  Assessment & Plan    Primary Cardiologist: [name], MD  Preoperative cardiovascular risk assessment. Cystoscopy, b/l retrograde pyelograms, optilume dilation of urethral stricture as well as turbt with gemcitabine instillation by Dr. [name]  Chart reviewed as part of pre-operative protocol coverage. According to the RCRI, patient has a 6.6% risk of MACE. Patient reports activity equivalent to 4.95 METS (per DASI).   Given past medical history and time since last visit, based on ACC/AHA guidelines, Kenneth [name] would be at acceptable risk for the planned procedure without further cardiovascular testing.   Patient was advised that if he develops new symptoms prior to surgery to contact our office to arrange a follow-up appointment.  he verbalized understanding.  Per Pharm.D.: "Request is to hold Eliquis for a total of 6 days (2 days before procedure, day of, and 3 days post procedure). Prefer to minimize time off anticoag given pt's  elevated CV risk. He has history of 3 CVAs, afib was diagnosed at time of most recent CVA in January 2024. Will defer to MD for input." Discussed further with pharmacy team. Lovenox bridge is not indicated, as it does not address the risk of stroke with holding Eliquis 3 days after. Will reach out to Dr. Wyline Mood when he returns to the office at the end of the week for further recommendations. Patient and his wife understand the increased risk of stroke but feel the benefit of undergoing surgery outweighs the risk. Patient will remain  on Plavix throughout perioperative period.   Will contact requesting party with Dr. Verna Czech recommendations.   I will route this recommendation to the requesting party via Epic fax function.  Please call with questions.  Time:   Today, I have spent 10 minutes with the patient with telehealth technology discussing medical history, symptoms, and management plan.     Kenneth Levering, NP  01/29/2023, 1:25 PM

## 2023-01-31 ENCOUNTER — Ambulatory Visit: Payer: Medicare HMO | Attending: Cardiology | Admitting: Student

## 2023-01-31 ENCOUNTER — Telehealth: Payer: Self-pay | Admitting: Student

## 2023-01-31 DIAGNOSIS — Z01810 Encounter for preprocedural cardiovascular examination: Secondary | ICD-10-CM

## 2023-01-31 NOTE — Telephone Encounter (Signed)
Error

## 2023-01-31 NOTE — Telephone Encounter (Signed)
Patient's son calling to find out date and time of upcoming procedure.  Call: [phone]

## 2023-01-31 NOTE — Telephone Encounter (Signed)
Please see my virtual visit note. Once Dr. Wyline Mood responds with recommendations for holding Eliquis, please contact requesting office and patient with any further recommendations.   Thank you!  DW

## 2023-02-01 NOTE — Telephone Encounter (Signed)
Son is aware or PAT on 07/12 at 315pm and they will give time of surgery at that time.

## 2023-02-02 ENCOUNTER — Telehealth: Payer: Self-pay | Admitting: Student

## 2023-02-02 NOTE — Telephone Encounter (Signed)
Error

## 2023-02-02 NOTE — Telephone Encounter (Signed)
   Name: Kenneth [name]  DOB: November 20, 1940  MRN: [id number]   Primary Cardiologist: [name], MD  As per previous correspondence for patient's pending surgical procedure, below are Dr. Verna Czech recommendations:  Typically would restart eliquis 1 day at most 2 days after. Would defer to surgeon, it this time is absolutely neccesary then really nothing additional that we can add. Sounds like surgery is indicated and would hold the eliquis as needed for the procedure with understanding there is at least some related risk   Dominga Ferry MD   I will forward this to the requesting party.   Carlos Levering, NP 02/02/2023, 1:06 PM

## 2023-02-02 NOTE — Progress Notes (Signed)
Typically would restart eliquis 1 day at most 2 days after. Would defer to surgeon, it this time is absolutely neccesary then really nothing additional that we can add. Sounds like surgery is indicated and would hold the eliquis as needed for the procedure with understanding there is at least some related risk  Dominga Ferry MD

## 2023-02-04 ENCOUNTER — Encounter (HOSPITAL_COMMUNITY): Payer: Self-pay | Admitting: Anesthesiology

## 2023-02-04 ENCOUNTER — Telehealth: Payer: Self-pay | Admitting: Cardiology

## 2023-02-04 ENCOUNTER — Other Ambulatory Visit: Payer: Self-pay

## 2023-02-04 ENCOUNTER — Encounter (HOSPITAL_COMMUNITY): Payer: Self-pay

## 2023-02-04 ENCOUNTER — Encounter (HOSPITAL_COMMUNITY)
Admission: RE | Admit: 2023-02-04 | Discharge: 2023-02-04 | Disposition: A | Discharge status: Home / Self Care | Payer: Medicare HMO | Source: Ambulatory Visit | Attending: Urology | Admitting: Urology

## 2023-02-04 HISTORY — DX: Tracheostomy status: Z93.0

## 2023-02-04 NOTE — Telephone Encounter (Signed)
   Received a page from answering service requesting a call back to [phone], [name] who is Kenneth [name] son. I called and [name] was able to confirm his father's identity with name/DOB. He was calling with regards to his dad's eliquis which was scheduled to be held starting tomorrow with pending urological surgery on 7/15. This surgery will now need to take place at [facility] than [facility] and has been cancelled. He wanted to know whether his dad should continue to take Eliquis. Discussed that until surgery is definitively rescheduled, he should continue to take Eliquis due to history of CVA and afib. Patient's son confirmed understanding of this plan.  [name], PA-C

## 2023-02-04 NOTE — OR Nursing (Signed)
Dr. Pilar Plate aware of issue of patient health problems. Dr. [name] notified.  Case cancelled by Dr. Alva Garnet and Dr. [name].  OR -Cyntia Wrenn notified. I called the patient spoke with wife. Reinforced to wife if need to take patient to the ER.for care to do so.Also gave her Dr. [name] office number to call for questions and concerns.

## 2023-02-07 ENCOUNTER — Emergency Department (HOSPITAL_COMMUNITY): Payer: Medicare HMO

## 2023-02-07 ENCOUNTER — Ambulatory Visit (HOSPITAL_COMMUNITY): Admission: RE | Admit: 2023-02-07 | Payer: Medicare HMO | Source: Home / Self Care | Admitting: Urology

## 2023-02-07 ENCOUNTER — Encounter (HOSPITAL_COMMUNITY): Payer: Self-pay

## 2023-02-07 ENCOUNTER — Emergency Department (HOSPITAL_COMMUNITY)
Admission: EM | Admit: 2023-02-07 | Discharge: 2023-02-07 | Disposition: A | Discharge status: Home / Self Care | Payer: Medicare HMO | Attending: Emergency Medicine | Admitting: Emergency Medicine

## 2023-02-07 ENCOUNTER — Encounter (HOSPITAL_COMMUNITY): Admission: RE | Payer: Self-pay | Source: Home / Self Care

## 2023-02-07 ENCOUNTER — Telehealth: Payer: Self-pay

## 2023-02-07 ENCOUNTER — Other Ambulatory Visit: Payer: Self-pay

## 2023-02-07 DIAGNOSIS — Z8673 Personal history of transient ischemic attack (TIA), and cerebral infarction without residual deficits: Secondary | ICD-10-CM | POA: Diagnosis not present

## 2023-02-07 DIAGNOSIS — Z930 Tracheostomy status: Secondary | ICD-10-CM | POA: Diagnosis not present

## 2023-02-07 DIAGNOSIS — N189 Chronic kidney disease, unspecified: Secondary | ICD-10-CM | POA: Diagnosis not present

## 2023-02-07 DIAGNOSIS — I129 Hypertensive chronic kidney disease with stage 1 through stage 4 chronic kidney disease, or unspecified chronic kidney disease: Secondary | ICD-10-CM | POA: Insufficient documentation

## 2023-02-07 DIAGNOSIS — E871 Hypo-osmolality and hyponatremia: Secondary | ICD-10-CM | POA: Insufficient documentation

## 2023-02-07 DIAGNOSIS — C679 Malignant neoplasm of bladder, unspecified: Secondary | ICD-10-CM | POA: Insufficient documentation

## 2023-02-07 DIAGNOSIS — R0609 Other forms of dyspnea: Secondary | ICD-10-CM | POA: Insufficient documentation

## 2023-02-07 DIAGNOSIS — D72829 Elevated white blood cell count, unspecified: Secondary | ICD-10-CM | POA: Insufficient documentation

## 2023-02-07 DIAGNOSIS — I2510 Atherosclerotic heart disease of native coronary artery without angina pectoris: Secondary | ICD-10-CM | POA: Diagnosis not present

## 2023-02-07 DIAGNOSIS — Z79899 Other long term (current) drug therapy: Secondary | ICD-10-CM | POA: Diagnosis not present

## 2023-02-07 DIAGNOSIS — Z7901 Long term (current) use of anticoagulants: Secondary | ICD-10-CM | POA: Insufficient documentation

## 2023-02-07 DIAGNOSIS — R319 Hematuria, unspecified: Secondary | ICD-10-CM | POA: Insufficient documentation

## 2023-02-07 DIAGNOSIS — Z7902 Long term (current) use of antithrombotics/antiplatelets: Secondary | ICD-10-CM | POA: Diagnosis not present

## 2023-02-07 DIAGNOSIS — Z1152 Encounter for screening for COVID-19: Secondary | ICD-10-CM | POA: Diagnosis not present

## 2023-02-07 LAB — URINALYSIS, ROUTINE W REFLEX MICROSCOPIC
Bilirubin Urine: NEGATIVE
Glucose, UA: NEGATIVE mg/dL
Ketones, ur: NEGATIVE mg/dL
Nitrite: NEGATIVE
Protein, ur: 100 mg/dL — AB
RBC / HPF: >50 RBC/hpf (ref 0–5)
Specific Gravity, Urine: 1.018 (ref 1.005–1.030)
pH: 5 (ref 5.0–8.0)

## 2023-02-07 LAB — CBC
HCT: 41.5 % (ref 39.0–52.0)
Hemoglobin: 13.3 g/dL (ref 13.0–17.0)
MCH: 27.9 pg (ref 26.0–34.0)
MCHC: 32 g/dL (ref 30.0–36.0)
MCV: 87 fL (ref 80.0–100.0)
Platelets: 295 10*3/uL (ref 150–400)
RBC: 4.77 MIL/uL (ref 4.22–5.81)
RDW: 14.6 % (ref 11.5–15.5)
WBC: 15.3 10*3/uL — ABNORMAL HIGH (ref 4.0–10.5)
nRBC: 0 % (ref 0.0–0.2)

## 2023-02-07 LAB — RESP PANEL BY RT-PCR (RSV, FLU A&B, COVID)  RVPGX2
Influenza A by PCR: NEGATIVE
Influenza B by PCR: NEGATIVE
Resp Syncytial Virus by PCR: NEGATIVE
SARS Coronavirus 2 by RT PCR: NEGATIVE

## 2023-02-07 LAB — BASIC METABOLIC PANEL
Anion gap: 11 (ref 5–15)
BUN: 14 mg/dL (ref 8–23)
CO2: 22 mmol/L (ref 22–32)
Calcium: 9 mg/dL (ref 8.9–10.3)
Chloride: 101 mmol/L (ref 98–111)
Creatinine, Ser: 1.67 mg/dL — ABNORMAL HIGH (ref 0.61–1.24)
GFR, Estimated: 40 mL/min — ABNORMAL LOW (ref >60)
Glucose, Bld: 110 mg/dL — ABNORMAL HIGH (ref 70–99)
Potassium: 4.4 mmol/L (ref 3.5–5.1)
Sodium: 134 mmol/L — ABNORMAL LOW (ref 135–145)

## 2023-02-07 SURGERY — CYSTOSCOPY, WITH URETHRAL DILATION
Anesthesia: General

## 2023-02-07 MED ORDER — IOHEXOL 350 MG/ML SOLN
60.0000 mL | Freq: Once | INTRAVENOUS | Status: AC | PRN
  Administered 2023-02-07: 60 mL via INTRAVENOUS

## 2023-02-07 NOTE — ED Provider Notes (Signed)
[place] EMERGENCY DEPARTMENT AT [facility] Provider Note   CSN: [id number] Arrival date & time: 02/07/23  1410     History  Chief Complaint  Patient presents with   Hematuria    Kenneth [name] is a 82 y.o. male with PMHx CKD, CAD, CVA, HLD, HTN, Afib and bladder cancer who presents to ED concerned for hematuria, dysuria, and DOE x4 weeks. Patient on Eliquis.  Patient diagnosed with bladder cancer 2 weeks ago and had surgery scheduled for today that was cancelled d/t patient with chronic trach. Patient not on chemotherapy or receiving any treatment for cancer. Family concerned that patient might be symptomatically anemic given the hematuria x4 weeks.  Denies loss of consciousness, falls, chest pain, cough, abdominal pain, nausea, vomiting, diarrhea, hematochezia. Denies hx DVT/PE, recent surgery/immobilization, hemoptysis.   Hematuria       Home Medications Prior to Admission medications   Medication Sig Start Date End Date Taking? Authorizing Provider  acetaminophen (TYLENOL) 500 MG tablet Take 500 mg by mouth every 6 (six) hours as needed for headache, fever, moderate pain or mild pain.    [provider]  amLODipine (NORVASC) 10 MG tablet Take 1 tablet (10 mg total) by mouth daily. 09/08/22 09/08/23  Dunn, Tacey Ruiz, PA-C  apixaban (ELIQUIS) 2.5 MG TABS tablet Take 1 tablet (2.5 mg total) by mouth 2 (two) times daily. 10/01/22   Antoine Poche, MD  carvedilol (COREG) 12.5 MG tablet Take 1.5 tablets (18.75 mg total) by mouth 2 (two) times daily. 09/16/22 09/11/23  Laurann Montana, PA-C  clopidogrel (PLAVIX) 75 MG tablet Take 1 tablet (75 mg total) by mouth daily. 10/04/22   Dunn, Tacey Ruiz, PA-C  Evolocumab (REPATHA SURECLICK) 140 MG/ML SOAJ Inject 140 mg into the skin every 14 (fourteen) days. 09/15/22   Antoine Poche, MD  ezetimibe (ZETIA) 10 MG tablet Take 1 tablet by mouth once daily 09/15/22   Antoine Poche, MD  nitroGLYCERIN (NITROSTAT) 0.4 MG SL  tablet Place 1 tablet (0.4 mg total) under the tongue every 5 (five) minutes as needed for chest pain. 01/31/17 05/28/23  Jodelle Gross, NP  pantoprazole (PROTONIX) 40 MG tablet Take 1 tablet by mouth once daily 07/28/21   Antoine Poche, MD  PARoxetine (PAXIL) 20 MG tablet Take 20 mg by mouth daily.    [provider]  spironolactone (ALDACTONE) 25 MG tablet Take 1 tablet (25 mg total) by mouth daily. 09/16/22 09/11/23  Dunn, Tacey Ruiz, PA-C      Allergies    Nifedical xl [nifedipine], Ativan [lorazepam], Penicillins, Sulfacetamide, Sulfonamide derivatives, Crestor [rosuvastatin], Erythromycin, Firvanq [vancomycin], and Statins    Review of Systems   Review of Systems  Genitourinary:  Positive for hematuria.    Physical Exam Updated Vital Signs BP (!) 163/89   Pulse 93   Temp 98.6 F (37 C)   Resp 20   Ht 5\' 6"  (1.676 m)   Wt 80.3 kg   SpO2 96%   BMI 28.57 kg/m  Physical Exam Vitals and nursing note reviewed.  Constitutional:      General: He is not in acute distress.    Appearance: He is not ill-appearing or toxic-appearing.  HENT:     Head: Normocephalic and atraumatic.     Mouth/Throat:     Mouth: Mucous membranes are moist.     Comments: permanent trach appears clean and intact Eyes:     General: No scleral icterus.  Right eye: No discharge.        Left eye: No discharge.     Conjunctiva/sclera: Conjunctivae normal.  Cardiovascular:     Rate and Rhythm: Normal rate and regular rhythm.     Pulses: Normal pulses.     Heart sounds: Normal heart sounds. No murmur heard. Pulmonary:     Effort: Pulmonary effort is normal. No respiratory distress.     Breath sounds: Normal breath sounds. No wheezing, rhonchi or rales.  Abdominal:     General: Abdomen is flat. Bowel sounds are normal. There is no distension.     Palpations: Abdomen is soft. There is no mass.     Tenderness: There is no abdominal tenderness. There is no right CVA tenderness or left CVA  tenderness.  Musculoskeletal:     Right lower leg: No edema.     Left lower leg: No edema.     Comments: +2 pedal pulse. No pitting edema.  Skin:    General: Skin is warm and dry.     Capillary Refill: Capillary refill takes less than 2 seconds.     Findings: No rash.  Neurological:     General: No focal deficit present.     Mental Status: He is alert and oriented to person, place, and time. Mental status is at baseline.     Cranial Nerves: No cranial nerve deficit.     Sensory: No sensory deficit.     Motor: No weakness.     Comments: GCS 15. Speech is goal oriented. No deficits appreciated to CN III-XII; symmetric eyebrow raise, no facial drooping, tongue midline. Patient has equal grip strength bilaterally with 5/5 strength against resistance in all major muscle groups bilaterally. Sensation to light touch intact. Patient moves extremities without ataxia.    Psychiatric:        Mood and Affect: Mood normal.        Behavior: Behavior normal.     ED Results / Procedures / Treatments   Labs (all labs ordered are listed, but only abnormal results are displayed) Labs Reviewed  URINALYSIS, ROUTINE W REFLEX MICROSCOPIC - Abnormal; Notable for the following components:      Result Value   APPearance HAZY (*)    Hgb urine dipstick LARGE (*)    Protein, ur 100 (*)    Leukocytes,Ua TRACE (*)    Bacteria, UA RARE (*)    Non Squamous Epithelial 0-5 (*)    All other components within normal limits  BASIC METABOLIC PANEL - Abnormal; Notable for the following components:   Sodium 134 (*)    Glucose, Bld 110 (*)    Creatinine, Ser 1.67 (*)    GFR, Estimated 40 (*)    All other components within normal limits  CBC - Abnormal; Notable for the following components:   WBC 15.3 (*)    All other components within normal limits  RESP PANEL BY RT-PCR (RSV, FLU A&B, COVID)  [id number]    EKG EKG Interpretation Date/Time:  Monday February 07 2023 16:23:09 EDT Ventricular Rate:  83 PR  Interval:  168 QRS Duration:  130 QT Interval:  392 QTC Calculation: 461 R Axis:   10  Text Interpretation: Sinus rhythm Ventricular bigeminy Right bundle branch block Confirmed by Alvester Chou [phone]) on 02/07/2023 4:34:51 PM  Radiology No results found.  Procedures Procedures    Medications Ordered in ED Medications  iohexol (OMNIPAQUE) 350 MG/ML injection 60 mL (60 mLs Intravenous Contrast Given 02/07/23 1959)    ED  Course/ Medical Decision Making/ A&P                             Medical Decision Making Amount and/or Complexity of Data Reviewed Labs: ordered. Radiology: ordered.  Risk Prescription drug management.   This patient presents to the ED for concern of weakness, this involves an extensive number of treatment options, and is a complaint that carries with it a high risk of complications and morbidity.  The differential diagnosis includes Ischemic stroke, intracerebral hemorrhage, subarachnoid hemorrhage, Guillain-Barr syndrome, hypoglycemia, electrolyte abnormality, sepsis, ACS, carbon monoxide poisoning, anemia, dehydration.   Co morbidities that complicate the patient evaluation  Bladder cancer, CKD, CAD, CVA, HLD, HTN, Afib    Lab Tests:  I Ordered, and personally interpreted labs.  The pertinent results include:   - UA: large Hgb; no concerns for infection - BMP: mild hyponatremia; Cr elevated at patient's baseline - CBC: leukocytosis (15.3); no anemia - Resp panel: negative   Imaging Studies ordered:  I ordered imaging studies including  - chest xray/CT chest: to assess for process contributing to patient's symptoms I independently visualized and interpreted imaging  I agree with the radiologist interpretation   Cardiac Monitoring: / EKG:  The patient was maintained on a cardiac monitor.  I personally viewed and interpreted the cardiac monitored which showed an underlying rhythm of: Sinus rhythm without acute ST changes   Risk  Score:  Wells DVT/PE: 0    Problem List / ED Course / Critical interventions / Medication management  Patient presents to ED concerned for hematuria, DOE, and weakness progressing for 4 weeks. Diagnosed with bladder cancer 2 weeks ago. Denying any other infectious symptoms such as cough, nausea, vomiting. Physical/neuro exam unremarkable. Lungs CTABL. Temp 99.4F. HR in 80s. O2 95% while ambulating. Chest xray without active cardiopulmonary disease.  EKG not concerning for STEMI. CBC with leukocytosis (15.3), no anemia. Mild hyponatremia (134); Cr is elevated at patient's baseline. UA without concern for infection. Resp panel negative. CT without concern for PE. [name] not able to perform outpatient surgery on patient today d/t trach. Consulted Urology (Dr. Lafonda Mosses). Urology does not believe that patient's symptoms are coming from his bladder cancer. Patient does not meet requirements for urgent bladder surgery. Shared results with patient and family and offered to board patient in the ED so that PT/OT could assess patient in the morning. Patient and family declined stating that they would rather be discharged home and follow up with outpatient urologist. Family specifically stating that they might as well go home since patient would not be receiving bladder surgery if he were to stay any longer. I have reviewed the patients home medicines and have made adjustments as needed. Patient afebrile with stable vitals. Provided patient and family with return precautions. Patient discharged in good condition.   DDX: these are considered less likely due to history of present illness and physical exam findings -Ischemic stroke/ICH/SAH: no neurodeficits -Hypoglycemia/electrolyte abnormality: CMP/BMP without concern -sepsis: no fever; vital signs stable -ACS: EKG sinus rhythm and no acute ST changes -Guillain-Barr syndrome: no tingling/paresthesias in extremities, no neurodeficits -carbon monoxide  poisoning: no nausea/vomiting; AMS; vision changes -anemia, dehydration: CBC/CMP/BMP without concern   Social Determinants of Health:  none           Final Clinical Impression(s) / ED Diagnoses Final diagnoses:  Malignant neoplasm of urinary bladder, unspecified site (HCC)  Hematuria, unspecified type    Rx / DC Orders  ED Discharge Orders     None         Dorthy Cooler, [place] 02/10/23 1346    Terald Sleeper, MD 02/10/23 1524

## 2023-02-07 NOTE — ED Notes (Signed)
Discharge instructions discussed with pt. Verbalized understanding. VSS. No questions or concerns regarding discharge  

## 2023-02-07 NOTE — ED Triage Notes (Signed)
Pt with recent dx of bladder CA here for eval of one month of hematuria. Seen by [facility] and had planned surgery for today to remove the cancer; however the doctor stated Friday he could not do the surgery in [place] d/t pt's trach. Patient continues to grow weaker and have DOE.

## 2023-02-07 NOTE — Progress Notes (Signed)
Wife, Gram Siedlecki, phoned in to ask when her husband, Kenneth [name], was going to have his surgery and why did the hospital, [name], cancel his surgery. She stated that Dr Dimas Millin office told her that [name] had canceled his surgery. I informed her that because of his trach being sutured in place, anesthesia, Dr Alva Garnet and Dr [name] canceled his procedure and could not be done at [facility]. I instructed pt that she needed to call Dr Dimas Millin office and that his office would have all of the information. Dr [name] notified of wife's concerns and stated that he would call her.

## 2023-02-07 NOTE — Discharge Instructions (Addendum)
It was a pleasure caring for you today. Seek emergency care if experiencing any new or worsening symptoms.

## 2023-02-07 NOTE — Telephone Encounter (Signed)
I spoke with Kenneth [name], his fathers surgery was canceled and wants to know the plan of care moving forward.  They decided to take pt to [facility] for evaluation.  The son states that his father is having trouble breathing and is not very active except when getting up to use the bathroom.  Can you please advise on next steps for patient.

## 2023-02-07 NOTE — ED Notes (Signed)
Pulse while ambulating was 95-97 and o2 was 100-95 did not go any lower than that

## 2023-02-07 NOTE — Telephone Encounter (Signed)
Patient's son, Kenneth [name] came by office to ask the status of patient's next step.   Wife was called on Friday (July 12) and was told the surgery was canceled due to patient has a trach. Son expressed father not wanting to be active, only to go to bathroom. Son will try to take patient to emergency room.  Was told patient will receive a call today to let him know the status of surgery.  Please call son Kenneth [name] - [phone]

## 2023-02-07 NOTE — Telephone Encounter (Signed)
Please see below.  How should patient proceed?

## 2023-02-08 NOTE — Telephone Encounter (Signed)
Son called to let Dr. [name] know he took Kenneth [name] to the ER yesterday at [facility].  Son reports "everything checked out okay but he is still passing a lot of blood in his urine"  Per son, [name] hospital OR unable to perform surgery due to patient having a trach. Son reports patient will need to have procedure done at [facility] per [facility] pre op team.  Message sent to Dr. [name] and surgery scheduler to coordinate time/date for [facility] OR procedure. Son voiced understanding.

## 2023-02-08 NOTE — Telephone Encounter (Signed)
Verbal from Dr. [name], patient will have to have surgery with Dr. Retta Diones at [facility] hospital.  [name] declined to do pt's surgery due to trach.  Message sent to Dr. Retta Diones informing him.  I will reach out to patient's wife to give an update.

## 2023-02-08 NOTE — Telephone Encounter (Signed)
Son is aware, we are waiting to confirm with Dr. Retta Diones and we will reach back out with a new surgery date.

## 2023-02-11 ENCOUNTER — Telehealth: Payer: Self-pay

## 2023-02-11 NOTE — Telephone Encounter (Signed)
Patient's son called again to check on the status of patient's surgery.  They did not want to have to wait until August to move forward with the surgery at [facility], the son states that they are looking into getting patient a sooner surgery date with another urologist in [place].  The son also states they contacted pt's PCP with concerns of him being lethargic and they were told by the PCP that based on pts labs from [facility] that he has an infection and pcp started patient on abx.  I did inform patient to be seen at [facility] er if his pain gets any worse and I would reach out with an update as soon as I could.

## 2023-02-16 ENCOUNTER — Telehealth: Payer: Self-pay

## 2023-02-16 NOTE — Telephone Encounter (Signed)
Wife called the office to to ask about next steps for patient surgery.  Per wife, patient was unable to have surgery at [facility] due to having a trach and would need to go to [facility] for surgery to have appropriate care for trach.  I spoke with Dr. [name] on the status regarding patients surgery as Dr. Retta Diones is on vacation for the month of July. Dr. [name] is unavailable for surgery at [facility] but would reach out to [facility] partners to see if anyone could help assist while Dr. Retta Diones is out.  I informed wife we would be in contact about moving forward with a new surgery date.  Wife instructed to take to ER if patient symptoms worsen. Wife voiced understanding.

## 2023-02-21 NOTE — Telephone Encounter (Signed)
I spoke with Glo Herring. We have discussed possible surgery dates and 03/02/2023 was agreed upon by all parties. Patient given information about surgery date, what to expect pre-operatively and post operatively.    We discussed that a pre-op nurse will be calling to set up the pre-op visit that will take place prior to surgery. Informed patient that our office will communicate any additional care to be provided after surgery.    Patients questions or concerns were discussed during our call. Advised to call our office should there be any additional information, questions or concerns that arise. Patient verbalized understanding.

## 2023-02-22 NOTE — Telephone Encounter (Signed)
Patient is scheduled for surgery at [facility] 03/02/2023.  Patient's son is aware of new surgery date.  Dr. Retta Diones gave verbal that new clearance will not be needed for 08/07 surgery and I confirmed with son that he will have patient hold his Eliquis 2 days prior to 08/07 per cardiology clearance.  Patient's son voiced understanding.

## 2023-02-22 NOTE — Patient Instructions (Signed)
DUE TO COVID-19 ONLY TWO VISITORS  (aged 84 and older)  ARE ALLOWED TO COME WITH YOU AND STAY IN THE WAITING ROOM ONLY DURING PRE OP AND PROCEDURE.   **NO VISITORS ARE ALLOWED IN THE SHORT STAY AREA OR RECOVERY ROOM!!**  IF YOU WILL BE ADMITTED INTO THE HOSPITAL YOU ARE ALLOWED ONLY FOUR SUPPORT PEOPLE DURING VISITATION HOURS ONLY (7 AM -8PM)   The support person(s) must pass our screening, gel in and out, and wear a mask at all times, including in the patient's room. Patients must also wear a mask when staff or their support person are in the room. Visitors GUEST BADGE MUST BE WORN VISIBLY  One adult visitor may remain with you overnight and MUST be in the room by 8 P.M.     Your procedure is scheduled on: 03/02/23   Report to [facility] Main Entrance    Report to admitting at: 9:30 AM   Call this number if you have problems the morning of surgery [phone]   Do not eat or drink food :After Midnight.    FOLLOW  PREP AND ANY ADDITIONAL PRE OP INSTRUCTIONS YOU RECEIVED FROM YOUR SURGEON'S OFFICE!!!   Oral Hygiene is also important to reduce your risk of infection.                                    Remember - BRUSH YOUR TEETH THE MORNING OF SURGERY WITH YOUR REGULAR TOOTHPASTE  DENTURES WILL BE REMOVED PRIOR TO SURGERY PLEASE DO NOT APPLY "Poly grip" OR ADHESIVES!!!   Do NOT smoke after Midnight   Take these medicines the morning of surgery with A SIP OF WATER: paroxetine,carvedilol,amlodipine,pantoprazole.Tylenol as needed.                              You may not have any metal on your body including hair pins, jewelry, and body piercing             Do not wear lotions, powders, perfumes/cologne, or deodorant              Men may shave face and neck.   Do not bring valuables to the hospital. [facility] IS NOT             RESPONSIBLE   FOR VALUABLES.   Contacts, glasses, or bridgework may not be worn into surgery.   Bring small overnight bag day of surgery.    DO NOT BRING YOUR HOME MEDICATIONS TO THE HOSPITAL. PHARMACY WILL DISPENSE MEDICATIONS LISTED ON YOUR MEDICATION LIST TO YOU DURING YOUR ADMISSION IN THE HOSPITAL!    Patients discharged on the day of surgery will not be allowed to drive home.  Someone NEEDS to stay with you for the first 24 hours after anesthesia.   Special Instructions: Bring a copy of your healthcare power of attorney and living will documents         the day of surgery if you haven't scanned them before.              Please read over the following fact sheets you were given: IF YOU HAVE QUESTIONS ABOUT YOUR PRE-OP INSTRUCTIONS PLEASE CALL [phone]    [facility] - Preparing for Surgery Before surgery, you can play an important role.  Because skin is not sterile, your skin needs to be as free of germs  as possible.  You can reduce the number of germs on your skin by washing with CHG (chlorahexidine gluconate) soap before surgery.  CHG is an antiseptic cleaner which kills germs and bonds with the skin to continue killing germs even after washing. Please DO NOT use if you have an allergy to CHG or antibacterial soaps.  If your skin becomes reddened/irritated stop using the CHG and inform your nurse when you arrive at Short Stay. Do not shave (including legs and underarms) for at least 48 hours prior to the first CHG shower.  You may shave your face/neck. Please follow these instructions carefully:  1.  Shower with CHG Soap the night before surgery and the  morning of Surgery.  2.  If you choose to wash your hair, wash your hair first as usual with your  normal  shampoo.  3.  After you shampoo, rinse your hair and body thoroughly to remove the  shampoo.                           4.  Use CHG as you would any other liquid soap.  You can apply chg directly  to the skin and wash                       Gently with a scrungie or clean washcloth.  5.  Apply the CHG Soap to your body ONLY FROM THE NECK DOWN.   Do not use on face/  open                           Wound or open sores. Avoid contact with eyes, ears mouth and genitals (private parts).                       Wash face,  Genitals (private parts) with your normal soap.             6.  Wash thoroughly, paying special attention to the area where your surgery  will be performed.  7.  Thoroughly rinse your body with warm water from the neck down.  8.  DO NOT shower/wash with your normal soap after using and rinsing off  the CHG Soap.                9.  Pat yourself dry with a clean towel.            10.  Wear clean pajamas.            11.  Place clean sheets on your bed the night of your first shower and do not  sleep with pets. Day of Surgery : Do not apply any lotions/deodorants the morning of surgery.  Please wear clean clothes to the hospital/surgery center.  FAILURE TO FOLLOW THESE INSTRUCTIONS MAY RESULT IN THE CANCELLATION OF YOUR SURGERY PATIENT SIGNATURE_________________________________  NURSE SIGNATURE__________________________________  ________________________________________________________________________

## 2023-02-23 ENCOUNTER — Encounter (HOSPITAL_COMMUNITY)
Admission: RE | Admit: 2023-02-23 | Discharge: 2023-02-23 | Disposition: A | Discharge status: Home / Self Care | Payer: Medicare HMO | Source: Ambulatory Visit | Attending: Urology | Admitting: Urology

## 2023-02-23 ENCOUNTER — Other Ambulatory Visit: Payer: Self-pay

## 2023-02-23 ENCOUNTER — Encounter (HOSPITAL_COMMUNITY): Payer: Self-pay

## 2023-02-23 DIAGNOSIS — D494 Neoplasm of unspecified behavior of bladder: Secondary | ICD-10-CM | POA: Diagnosis not present

## 2023-02-23 DIAGNOSIS — Z951 Presence of aortocoronary bypass graft: Secondary | ICD-10-CM | POA: Diagnosis not present

## 2023-02-23 DIAGNOSIS — Z7901 Long term (current) use of anticoagulants: Secondary | ICD-10-CM | POA: Insufficient documentation

## 2023-02-23 DIAGNOSIS — Z955 Presence of coronary angioplasty implant and graft: Secondary | ICD-10-CM | POA: Insufficient documentation

## 2023-02-23 DIAGNOSIS — I252 Old myocardial infarction: Secondary | ICD-10-CM | POA: Diagnosis not present

## 2023-02-23 DIAGNOSIS — N183 Chronic kidney disease, stage 3 unspecified: Secondary | ICD-10-CM | POA: Insufficient documentation

## 2023-02-23 DIAGNOSIS — Z01818 Encounter for other preprocedural examination: Secondary | ICD-10-CM | POA: Insufficient documentation

## 2023-02-23 DIAGNOSIS — I129 Hypertensive chronic kidney disease with stage 1 through stage 4 chronic kidney disease, or unspecified chronic kidney disease: Secondary | ICD-10-CM | POA: Diagnosis not present

## 2023-02-23 NOTE — Progress Notes (Signed)
Anesthesia Chart Review   Case: [id number] Date/Time: 03/02/23 1130   Procedures:      CYSTOSCOPY WITH URETHRAL DILATATION- optilume dilation     CYSTOSCOPY WITH RETROGRADE PYELOGRAM (Bilateral)     TRANSURETHRAL RESECTION OF BLADDER TUMOR (TURBT)- with gemcitabine instillation   Anesthesia type: General   Pre-op diagnosis: Urethral stricture, bladder tumor   Location: [facility] ROOM 08 / [facility]   Surgeons: Kenneth [name] Matar, MD       DISCUSSION:81 y.o. former smoker with h/o HTN, PVD, CVA, CAD (s/p CABG 2003, inferior STEMI 02/2020 s/p DES to RCA, NSTEMI 06/05/20 with DES to RCA, NSTEMI 11/2020 s/p PTCA to [facility], inferior STEMI 08/2021 s/p DESx3 to RCA stent ), carotid artery disease (s/p stenting on the right 2013 and left TCAR January 2024), RBBB, PAF (on Eliquis), CKD Stage III, urethral stricture, bladder tumor scheduled for above procedure 03/02/2023 with Dr. Marcine [name].   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 Pt with trach in place. Indwelling 6 CFS Shiley tracheostomy per ENT notes.   Per anesthesia note 05/04/2018, "6.0 Shiley cuffed trach attempted to be passed through tracheostomy stoma, unable to pass due to resistance, 5.5 reinforced cuffed ETT passed through stoma with no resistance, +ETCO2, Bilateral breath sounds, secured via left neck with tape."  Per anesthesia note 08/17/2022, "6.0 reinforced ett passed through trach stoma. Cuff inflated. Ett sutured to skin by Dr. Karin [name]."  Pt seen by cardiology 01/31/2023. Per OV note, "Preoperative cardiovascular risk assessment. Cystoscopy, b/l retrograde pyelograms, optilume dilation of urethral stricture as well as turbt with gemcitabine instillation by Dr. Ronne [name] Chart reviewed as part of pre-operative protocol coverage. According to the RCRI, patient has a 6.6% risk of MACE. Patient reports activity equivalent to 4.95 METS (per DASI).  Given past  medical history and time since last visit, based on ACC/AHA guidelines, Kenneth [name] [name] would be at acceptable risk for the planned procedure without further cardiovascular testing.  Patient was advised that if he develops new symptoms prior to surgery to contact our office to arrange a follow-up appointment.  he verbalized understanding. Per Pharm.D.: "Request is to hold Eliquis for a total of 6 days (2 days before procedure, day of, and 3 days post procedure). Prefer to minimize time off anticoag given pt's elevated CV risk. He has history of 3 CVAs, afib was diagnosed at time of most recent CVA in January 2024. Will defer to MD for input." Discussed further with pharmacy team. Lovenox bridge is not indicated, as it does not address the risk of stroke with holding Eliquis 3 days after. Will reach out to Kenneth [name] [name] when he returns to the office at the end of the week for further recommendations. Patient and his wife understand the increased risk of stroke but feel the benefit of undergoing surgery outweighs the risk. Patient will remain on Plavix throughout perioperative period."  VS: BP 137/79   Pulse 65   Temp 36.9 C (Oral)   Ht 5\' 6"  (1.676 m)   Wt 81.6 kg   SpO2 98%   BMI 29.05 kg/m   PROVIDERS: Kenneth [name] Perches, MD is PCP   Cardiologist - Dr. Dina [name]  LABS: Labs reviewed: Acceptable for surgery. (all labs ordered are listed, but only abnormal results are displayed)  Labs Reviewed - No data to display   IMAGES: VAS US Carotid 09/17/22 Summary:  Right Carotid: Patent CCA stent with mid stent stenosis of 50-75%.  Patent ICA stent with no stenosis.                 The ECA appears occluded at the origin with retrograde  filling                via a branch.   Left Carotid: The ECA appears >50% stenosed. Patent ICA stent with no  stenosis.   EKG:   CV: Echo 08/09/2022 1. Left ventricular ejection fraction, by estimation, is 55 to 60%. The  left ventricle has  normal function. The left ventricle has no regional  wall motion abnormalities. Left ventricular diastolic function could not  be evaluated.   2. Right ventricular systolic function is mildly reduced. The right  ventricular size is mildly enlarged.   3. The mitral valve is grossly normal. No evidence of mitral valve  regurgitation. No evidence of mitral stenosis.   4. The aortic valve was not well visualized. There is mild calcification  of the aortic valve. Aortic valve regurgitation is not visualized. No  aortic stenosis is present.   5. Aortic dilatation noted. There is borderline dilatation of the aortic  root, measuring 38 mm.   Comparison(s): Changes from prior study are noted. LVEF improved from  45-50% in 08/2021 tio 55-60% now.  Past Medical History:  Diagnosis Date   Acute ST elevation myocardial infarction (STEMI) of inferior wall (HCC) 03/01/2020   [facility] [place]   Anxiety    Arthritis    Back (05/04/2018)   Carotid artery disease (HCC)    a. 04/2018 s/p R carotid stenting. b. L TCAR 07/2022.   Chronic lower back pain    "have it at night" (05/04/2018)   CKD (chronic kidney disease), stage III (HCC)    Coronary artery disease    CVA (cerebral vascular accident) (HCC)    Depression    GERD (gastroesophageal reflux disease)    High triglycerides    History of kidney stones    Hyperlipidemia LDL goal <70    Hypertension    Hypertensive crisis 08/08/2022   Laryngeal carcinoma (HCC) 1998   PAF (paroxysmal atrial fibrillation) (HCC)    Peripheral vascular disease (HCC)    Pneumonia 11/11/2021   RBBB    Sinus bradycardia    Stroke [facility])    "he's had several little strokes; many that he wasn't aware of" (05/04/2018)   TIA (transient ischemic attack)    Tobacco abuse    Tracheal stenosis    Tracheostomy in place [facility])     Past Surgical History:  Procedure Laterality Date   CAROTID STENT Right 06-13-12; 05/04/2018   CAROTID STENT INSERTION N/A  06/13/2012   Procedure: CAROTID STENT INSERTION;  Surgeon: Kenneth Libman, MD;  Location: [facility] CATH LAB;  Service: Cardiovascular;  Laterality: N/A;   CATARACT EXTRACTION, BILATERAL Bilateral    COLONOSCOPY  11/10/2011   Dr. Jena Gauss: hemorrhoids, tubular adenoma   CORNEAL TRANSPLANT Bilateral    CORONARY ARTERY BYPASS GRAFT  ~ 2003   "CABG X3"   CORONARY BALLOON ANGIOPLASTY N/A 11/26/2020   Procedure: CORONARY BALLOON ANGIOPLASTY;  Surgeon: Lennette Bihari, MD;  Location: [facility] INVASIVE CV LAB;  Service: Cardiovascular;  Laterality: N/A;   CORONARY STENT INTERVENTION N/A 06/05/2020   Procedure: CORONARY STENT INTERVENTION;  Surgeon: Runell Gess, MD;  Location: [facility] INVASIVE CV LAB;  Service: Cardiovascular;  Laterality: N/A;   EYE SURGERY     INSERTION OF RETROGRADE CAROTID STENT Right 05/04/2018   Procedure: INSERTION OF RIGHT  CAROTID STENT using an ABBOT- XACT carotid stent system;  Surgeon: Kenneth Libman, MD;  Location: [facility] OR;  Service: Vascular;  Laterality: Right;   LAPAROSCOPIC CHOLECYSTECTOMY  2007   LEFT HEART CATH AND CORS/GRAFTS ANGIOGRAPHY N/A 10/07/2016   Procedure: Left Heart Cath and Cors/Grafts Angiography;  Surgeon: Lennette Bihari, MD;  Location[facility]AS[facility] CV LAB;  Service: Cardiovascular;  Laterality: N/A;   LEFT HEART CATH AND CORS/GRAFTS ANGIOGRAPHY N/A 06/05/2020   Procedure: LEFT HEART CATH AND CORS/GRAFTS ANGIOGRAPHY;  Surgeon: Runell Gess, MD;  Location: [facility] CV LAB;  Service: Cardiovascular;  Laterality: N/A;   LEFT HEART CATH AND CORS/GRAFTS ANGIOGRAPHY N/A 11/26/2020   Procedure: LEFT HEART CATH AND CORS/GRAFTS ANGIOGRAPHY;  Surgeon: Lennette Bihari, MD;  Location: [facility]VAS[facility] CV LAB;  Service: Cardiovascular;  Laterality: N/A;   PARTIAL LARYNGECTOMY  1998   TRACHEOSTOMY  03/13/2018   "@ [facility]"   TRANSCAROTID ARTERY REVASCULARIZATION  Left 08/17/2022   Procedure: Transcarotid Artery Revascularization;  Surgeon: Victorino Sparrow, MD;  Location: [facility] OR;   Service: Vascular;  Laterality: Left;    MEDICATIONS:  acetaminophen (TYLENOL) 500 MG tablet   amLODipine (NORVASC) 10 MG tablet   apixaban (ELIQUIS) 2.5 MG TABS tablet   carvedilol (COREG) 12.5 MG tablet   clopidogrel (PLAVIX) 75 MG tablet   Evolocumab (REPATHA SURECLICK) 140 MG/ML SOAJ   ezetimibe (ZETIA) 10 MG tablet   Melatonin 10 MG TABS   nitroGLYCERIN (NITROSTAT) 0.4 MG SL tablet   pantoprazole (PROTONIX) 40 MG tablet   PARoxetine (PAXIL) 20 MG tablet   spironolactone (ALDACTONE) 25 MG tablet   No current facility-administered medications for this encounter.    Jodell Cipro Ward, PA-C [facility] Pre-Surgical Testing [phone]

## 2023-02-23 NOTE — Anesthesia Preprocedure Evaluation (Addendum)
Anesthesia Evaluation  Patient identified by MRN, date of birth, ID band Patient awake    Reviewed: Allergy & Precautions, H&P , NPO status , Patient's Chart, lab work & pertinent test results, reviewed documented beta blocker date and time   Airway Mallampati: Trach       Dental  (+) Upper Dentures   Pulmonary pneumonia, resolved, former smoker Hx/o laryngeal Ca S/P tracheostomy, RT and partial laryngectomy Hx/o tracheal stenosis   Pulmonary exam normal breath sounds clear to auscultation       Cardiovascular Exercise Tolerance: Good hypertension, Pt. on medications and Pt. on home beta blockers + angina  + CAD, + Past MI, + CABG, + Peripheral Vascular Disease and + DOE  + dysrhythmias Atrial Fibrillation  Rhythm:Regular Rate:Normal  S/P CABG x 3 S/P stent Left ICA   Neuro/Psych  PSYCHIATRIC DISORDERS Anxiety Depression    TIA Neuromuscular disease CVA, Residual Symptoms    GI/Hepatic Neg liver ROS,GERD  Medicated,,  Endo/Other  diabetes, Well Controlled, Type 2  HLD  Renal/GU Renal InsufficiencyRenal disease   Urethral stricture Bladder tumor    Musculoskeletal  (+) Arthritis , Osteoarthritis,    Abdominal   Peds  Hematology  (+) Blood dyscrasia Plavix  therapy- continued during perioperative period Eliquis therapy 2.5mg  bid- last dose 6 days ago   Anesthesia Other Findings   Reproductive/Obstetrics negative OB ROS                             Anesthesia Physical Anesthesia Plan  ASA: 4  Anesthesia Plan: General   Post-op Pain Management: Tylenol PO (pre-op)*   Induction: Intravenous  PONV Risk Score and Plan: 3 and Ondansetron, Dexamethasone and Treatment may vary due to age or medical condition  Airway Management Planned: Oral ETT and Tracheostomy  Additional Equipment: None  Intra-op Plan:   Post-operative Plan: Extubation in OR  Informed Consent: I have reviewed  the patients History and Physical, chart, labs and discussed the procedure including the risks, benefits and alternatives for the proposed anesthesia with the patient or authorized representative who has indicated his/her understanding and acceptance.     Dental advisory given  Plan Discussed with: CRNA and Anesthesiologist  Anesthesia Plan Comments: (See PAT note 02/23/2023  Will place 6.0 cuffed ETT through tracheostomy stoma after induction)       Anesthesia Quick Evaluation

## 2023-02-23 NOTE — Progress Notes (Signed)
For Short Stay: COVID SWAB appointment date:  Bowel Prep reminder:   For Anesthesia: PCP - Dr. [name]. Cardiologist - Dr. [name] CT angio: 02/07/23 Chest x-ray - 02/07/23 EKG - 02/07/23 Stress Test -  ECHO - 08/09/22 Cardiac Cath - 09/01/21 Pacemaker/ICD device last checked: Pacemaker orders received: Device Rep notified:  Spinal Cord Stimulator: N/A  Sleep Study - N/A CPAP -   Fasting Blood Sugar - N/A Checks Blood Sugar _____ times a day Date and result of last Hgb A1c-  Last dose of GLP1 agonist- N/A GLP1 instructions:   Last dose of SGLT-2 inhibitors- N/A SGLT-2 instructions:   Blood Thinner Instructions: Eliquis will be hold after: 02/27/23. Pt's family were advised to ask cardiologist about Plavix. Aspirin Instructions: Last Dose:  Activity level: Can go up a flight of stairs and activities of daily living without stopping and without chest pain and/or shortness of breath   Able to exercise without chest pain and/or shortness of breath   Unable to go up a flight of stairs without shortness of breath    Anesthesia review: Hx: HTN,MI,CAD,CKD III,PAfib,Stroke,Trache.  Patient denies shortness of breath, fever, cough and chest pain at PAT appointment   Patient verbalized understanding of instructions that were given to them at the PAT appointment. Patient was also instructed that they will need to review over the PAT instructions again at home before surgery.

## 2023-03-02 ENCOUNTER — Encounter (HOSPITAL_COMMUNITY): Payer: Self-pay | Admitting: Urology

## 2023-03-02 ENCOUNTER — Encounter (HOSPITAL_COMMUNITY): Payer: Self-pay

## 2023-03-02 ENCOUNTER — Ambulatory Visit (HOSPITAL_COMMUNITY): Payer: Medicare HMO | Admitting: Physician Assistant

## 2023-03-02 ENCOUNTER — Encounter (HOSPITAL_COMMUNITY)
Admission: AD | Disposition: A | Discharge status: Home / Self Care | Payer: Self-pay | Source: Home / Self Care | Attending: Internal Medicine

## 2023-03-02 ENCOUNTER — Ambulatory Visit (HOSPITAL_COMMUNITY): Payer: Medicare HMO

## 2023-03-02 ENCOUNTER — Observation Stay (HOSPITAL_COMMUNITY): Payer: Medicare HMO

## 2023-03-02 ENCOUNTER — Other Ambulatory Visit: Payer: Self-pay

## 2023-03-02 ENCOUNTER — Ambulatory Visit (HOSPITAL_BASED_OUTPATIENT_CLINIC_OR_DEPARTMENT_OTHER): Payer: Medicare HMO | Admitting: Anesthesiology

## 2023-03-02 ENCOUNTER — Inpatient Hospital Stay (HOSPITAL_COMMUNITY)
Admission: AD | Admit: 2023-03-02 | Discharge: 2023-03-04 | DRG: 669 | Disposition: A | Discharge status: Home / Self Care | Payer: Medicare HMO | Attending: Internal Medicine | Admitting: Internal Medicine

## 2023-03-02 DIAGNOSIS — Z888 Allergy status to other drugs, medicaments and biological substances status: Secondary | ICD-10-CM

## 2023-03-02 DIAGNOSIS — N3289 Other specified disorders of bladder: Secondary | ICD-10-CM | POA: Diagnosis not present

## 2023-03-02 DIAGNOSIS — J9589 Other postprocedural complications and disorders of respiratory system, not elsewhere classified: Secondary | ICD-10-CM | POA: Diagnosis not present

## 2023-03-02 DIAGNOSIS — Z881 Allergy status to other antibiotic agents status: Secondary | ICD-10-CM

## 2023-03-02 DIAGNOSIS — Z7902 Long term (current) use of antithrombotics/antiplatelets: Secondary | ICD-10-CM

## 2023-03-02 DIAGNOSIS — Z8249 Family history of ischemic heart disease and other diseases of the circulatory system: Secondary | ICD-10-CM

## 2023-03-02 DIAGNOSIS — I25119 Atherosclerotic heart disease of native coronary artery with unspecified angina pectoris: Secondary | ICD-10-CM

## 2023-03-02 DIAGNOSIS — K5900 Constipation, unspecified: Secondary | ICD-10-CM | POA: Diagnosis present

## 2023-03-02 DIAGNOSIS — Z95828 Presence of other vascular implants and grafts: Secondary | ICD-10-CM

## 2023-03-02 DIAGNOSIS — Z930 Tracheostomy status: Secondary | ICD-10-CM

## 2023-03-02 DIAGNOSIS — I10 Essential (primary) hypertension: Secondary | ICD-10-CM | POA: Diagnosis present

## 2023-03-02 DIAGNOSIS — Z8701 Personal history of pneumonia (recurrent): Secondary | ICD-10-CM

## 2023-03-02 DIAGNOSIS — I480 Paroxysmal atrial fibrillation: Secondary | ICD-10-CM | POA: Diagnosis present

## 2023-03-02 DIAGNOSIS — Z87891 Personal history of nicotine dependence: Secondary | ICD-10-CM | POA: Diagnosis not present

## 2023-03-02 DIAGNOSIS — I5032 Chronic diastolic (congestive) heart failure: Secondary | ICD-10-CM | POA: Diagnosis present

## 2023-03-02 DIAGNOSIS — J479 Bronchiectasis, uncomplicated: Secondary | ICD-10-CM | POA: Diagnosis present

## 2023-03-02 DIAGNOSIS — C672 Malignant neoplasm of lateral wall of bladder: Secondary | ICD-10-CM | POA: Diagnosis not present

## 2023-03-02 DIAGNOSIS — N1831 Chronic kidney disease, stage 3a: Secondary | ICD-10-CM | POA: Diagnosis present

## 2023-03-02 DIAGNOSIS — Z8521 Personal history of malignant neoplasm of larynx: Secondary | ICD-10-CM

## 2023-03-02 DIAGNOSIS — C679 Malignant neoplasm of bladder, unspecified: Secondary | ICD-10-CM

## 2023-03-02 DIAGNOSIS — N35912 Unspecified bulbous urethral stricture, male: Secondary | ICD-10-CM | POA: Diagnosis present

## 2023-03-02 DIAGNOSIS — K219 Gastro-esophageal reflux disease without esophagitis: Secondary | ICD-10-CM | POA: Diagnosis present

## 2023-03-02 DIAGNOSIS — Z79899 Other long term (current) drug therapy: Secondary | ICD-10-CM

## 2023-03-02 DIAGNOSIS — Z880 Allergy status to penicillin: Secondary | ICD-10-CM

## 2023-03-02 DIAGNOSIS — Z83438 Family history of other disorder of lipoprotein metabolism and other lipidemia: Secondary | ICD-10-CM

## 2023-03-02 DIAGNOSIS — Z955 Presence of coronary angioplasty implant and graft: Secondary | ICD-10-CM

## 2023-03-02 DIAGNOSIS — Z8673 Personal history of transient ischemic attack (TIA), and cerebral infarction without residual deficits: Secondary | ICD-10-CM

## 2023-03-02 DIAGNOSIS — Z947 Corneal transplant status: Secondary | ICD-10-CM

## 2023-03-02 DIAGNOSIS — Z87442 Personal history of urinary calculi: Secondary | ICD-10-CM

## 2023-03-02 DIAGNOSIS — E781 Pure hyperglyceridemia: Secondary | ICD-10-CM | POA: Diagnosis present

## 2023-03-02 DIAGNOSIS — I130 Hypertensive heart and chronic kidney disease with heart failure and stage 1 through stage 4 chronic kidney disease, or unspecified chronic kidney disease: Secondary | ICD-10-CM | POA: Diagnosis present

## 2023-03-02 DIAGNOSIS — Z7901 Long term (current) use of anticoagulants: Secondary | ICD-10-CM

## 2023-03-02 DIAGNOSIS — Z9049 Acquired absence of other specified parts of digestive tract: Secondary | ICD-10-CM

## 2023-03-02 DIAGNOSIS — Z951 Presence of aortocoronary bypass graft: Secondary | ICD-10-CM

## 2023-03-02 DIAGNOSIS — I739 Peripheral vascular disease, unspecified: Secondary | ICD-10-CM | POA: Diagnosis present

## 2023-03-02 DIAGNOSIS — J8410 Pulmonary fibrosis, unspecified: Secondary | ICD-10-CM | POA: Diagnosis present

## 2023-03-02 DIAGNOSIS — Z882 Allergy status to sulfonamides status: Secondary | ICD-10-CM

## 2023-03-02 DIAGNOSIS — N35919 Unspecified urethral stricture, male, unspecified site: Secondary | ICD-10-CM | POA: Diagnosis not present

## 2023-03-02 DIAGNOSIS — I2510 Atherosclerotic heart disease of native coronary artery without angina pectoris: Secondary | ICD-10-CM | POA: Diagnosis present

## 2023-03-02 DIAGNOSIS — R0902 Hypoxemia: Secondary | ICD-10-CM | POA: Diagnosis not present

## 2023-03-02 DIAGNOSIS — I252 Old myocardial infarction: Secondary | ICD-10-CM

## 2023-03-02 DIAGNOSIS — I779 Disorder of arteries and arterioles, unspecified: Secondary | ICD-10-CM | POA: Diagnosis present

## 2023-03-02 DIAGNOSIS — J398 Other specified diseases of upper respiratory tract: Secondary | ICD-10-CM

## 2023-03-02 HISTORY — PX: TRANSURETHRAL RESECTION OF BLADDER TUMOR: SHX2575

## 2023-03-02 LAB — CBC WITH DIFFERENTIAL/PLATELET
Abs Immature Granulocytes: 0.07 10*3/uL (ref 0.00–0.07)
Basophils Absolute: 0 10*3/uL (ref 0.0–0.1)
Basophils Relative: 0 %
Eosinophils Absolute: 0 10*3/uL (ref 0.0–0.5)
Eosinophils Relative: 0 %
HCT: 38.2 % — ABNORMAL LOW (ref 39.0–52.0)
Hemoglobin: 11.9 g/dL — ABNORMAL LOW (ref 13.0–17.0)
Immature Granulocytes: 1 %
Lymphocytes Relative: 4 %
Lymphs Abs: 0.5 10*3/uL — ABNORMAL LOW (ref 0.7–4.0)
MCH: 28 pg (ref 26.0–34.0)
MCHC: 31.2 g/dL (ref 30.0–36.0)
MCV: 89.9 fL (ref 80.0–100.0)
Monocytes Absolute: 0.1 10*3/uL (ref 0.1–1.0)
Monocytes Relative: 1 %
Neutro Abs: 11.4 10*3/uL — ABNORMAL HIGH (ref 1.7–7.7)
Neutrophils Relative %: 94 %
Platelets: 282 10*3/uL (ref 150–400)
RBC: 4.25 MIL/uL (ref 4.22–5.81)
RDW: 15.5 % (ref 11.5–15.5)
WBC: 12.2 10*3/uL — ABNORMAL HIGH (ref 4.0–10.5)
nRBC: 0 % (ref 0.0–0.2)

## 2023-03-02 LAB — COMPREHENSIVE METABOLIC PANEL
ALT: 17 U/L (ref 0–44)
AST: 19 U/L (ref 15–41)
Albumin: 3.5 g/dL (ref 3.5–5.0)
Alkaline Phosphatase: 93 U/L (ref 38–126)
Anion gap: 9 (ref 5–15)
BUN: 26 mg/dL — ABNORMAL HIGH (ref 8–23)
CO2: 23 mmol/L (ref 22–32)
Calcium: 8.6 mg/dL — ABNORMAL LOW (ref 8.9–10.3)
Chloride: 102 mmol/L (ref 98–111)
Creatinine, Ser: 1.82 mg/dL — ABNORMAL HIGH (ref 0.61–1.24)
GFR, Estimated: 36 mL/min — ABNORMAL LOW (ref >60)
Glucose, Bld: 167 mg/dL — ABNORMAL HIGH (ref 70–99)
Potassium: 4.8 mmol/L (ref 3.5–5.1)
Sodium: 134 mmol/L — ABNORMAL LOW (ref 135–145)
Total Bilirubin: 0.5 mg/dL (ref 0.3–1.2)
Total Protein: 6.9 g/dL (ref 6.5–8.1)

## 2023-03-02 LAB — MAGNESIUM: Magnesium: 2.3 mg/dL (ref 1.7–2.4)

## 2023-03-02 SURGERY — TURBT (TRANSURETHRAL RESECTION OF BLADDER TUMOR)
Anesthesia: General

## 2023-03-02 MED ORDER — LIDOCAINE HCL (PF) 2 % IJ SOLN
INTRAMUSCULAR | Status: AC
  Filled 2023-03-02: qty 5

## 2023-03-02 MED ORDER — EZETIMIBE 10 MG PO TABS
10.0000 mg | ORAL_TABLET | Freq: Every day | ORAL | Status: DC
  Administered 2023-03-03 – 2023-03-04 (×2): 10 mg via ORAL
  Filled 2023-03-02 (×2): qty 1

## 2023-03-02 MED ORDER — ONDANSETRON HCL 4 MG/2ML IJ SOLN
INTRAMUSCULAR | Status: AC
  Filled 2023-03-02: qty 2

## 2023-03-02 MED ORDER — FUROSEMIDE 10 MG/ML IJ SOLN
INTRAMUSCULAR | Status: AC
  Filled 2023-03-02: qty 2

## 2023-03-02 MED ORDER — HYDROMORPHONE HCL 1 MG/ML IJ SOLN
INTRAMUSCULAR | Status: AC
  Filled 2023-03-02: qty 1

## 2023-03-02 MED ORDER — FENTANYL CITRATE (PF) 100 MCG/2ML IJ SOLN
INTRAMUSCULAR | Status: AC
  Filled 2023-03-02: qty 2

## 2023-03-02 MED ORDER — MELATONIN 10 MG PO TABS: 10.0000 mg | ORAL_TABLET | Freq: Every day | ORAL | Status: DC

## 2023-03-02 MED ORDER — SPIRONOLACTONE 25 MG PO TABS
25.0000 mg | ORAL_TABLET | Freq: Every day | ORAL | Status: DC
  Administered 2023-03-03 – 2023-03-04 (×2): 25 mg via ORAL
  Filled 2023-03-02 (×2): qty 1

## 2023-03-02 MED ORDER — CHLORHEXIDINE GLUCONATE 0.12 % MT SOLN
15.0000 mL | Freq: Once | OROMUCOSAL | Status: AC
  Administered 2023-03-02: 15 mL via OROMUCOSAL

## 2023-03-02 MED ORDER — ONDANSETRON HCL 4 MG/2ML IJ SOLN: 4.0000 mg | Freq: Once | INTRAMUSCULAR | Status: DC | PRN

## 2023-03-02 MED ORDER — SODIUM CHLORIDE 0.9 % IR SOLN
Status: DC | PRN
  Administered 2023-03-02: 3000 mL

## 2023-03-02 MED ORDER — PANTOPRAZOLE SODIUM 40 MG PO TBEC
40.0000 mg | DELAYED_RELEASE_TABLET | Freq: Every day | ORAL | Status: DC
  Administered 2023-03-03 – 2023-03-04 (×2): 40 mg via ORAL
  Filled 2023-03-02 (×2): qty 1

## 2023-03-02 MED ORDER — SUGAMMADEX SODIUM 200 MG/2ML IV SOLN
INTRAVENOUS | Status: DC | PRN
  Administered 2023-03-02: 200 mg via INTRAVENOUS

## 2023-03-02 MED ORDER — ACETAMINOPHEN 325 MG PO TABS: 650.0000 mg | ORAL_TABLET | Freq: Four times a day (QID) | ORAL | Status: DC | PRN

## 2023-03-02 MED ORDER — LIDOCAINE HCL (CARDIAC) PF 100 MG/5ML IV SOSY
PREFILLED_SYRINGE | INTRAVENOUS | Status: DC | PRN
  Administered 2023-03-02: 60 mg via INTRAVENOUS

## 2023-03-02 MED ORDER — AMLODIPINE BESYLATE 10 MG PO TABS
10.0000 mg | ORAL_TABLET | Freq: Every day | ORAL | Status: DC
  Administered 2023-03-03 – 2023-03-04 (×2): 10 mg via ORAL
  Filled 2023-03-02 (×2): qty 1

## 2023-03-02 MED ORDER — ACETAMINOPHEN 650 MG RE SUPP: 650.0000 mg | Freq: Four times a day (QID) | RECTAL | Status: DC | PRN

## 2023-03-02 MED ORDER — DEXAMETHASONE SODIUM PHOSPHATE 10 MG/ML IJ SOLN
INTRAMUSCULAR | Status: DC | PRN
  Administered 2023-03-02: 5 mg via INTRAVENOUS

## 2023-03-02 MED ORDER — ONDANSETRON HCL 4 MG/2ML IJ SOLN
INTRAMUSCULAR | Status: DC | PRN
Start: 2023-03-02 — End: 2023-03-02
  Administered 2023-03-02: 4 mg via INTRAVENOUS

## 2023-03-02 MED ORDER — PROPOFOL 10 MG/ML IV BOLUS
INTRAVENOUS | Status: DC | PRN
Start: 2023-03-02 — End: 2023-03-02
  Administered 2023-03-02: 100 mg via INTRAVENOUS

## 2023-03-02 MED ORDER — KETOROLAC TROMETHAMINE 15 MG/ML IJ SOLN
INTRAMUSCULAR | Status: DC | PRN
Start: 2023-03-02 — End: 2023-03-02
  Administered 2023-03-02: 15 mg via INTRAVENOUS

## 2023-03-02 MED ORDER — APIXABAN 2.5 MG PO TABS: 2.5000 mg | ORAL_TABLET | Freq: Two times a day (BID) | ORAL | Status: DC

## 2023-03-02 MED ORDER — ROCURONIUM BROMIDE 10 MG/ML (PF) SYRINGE
PREFILLED_SYRINGE | INTRAVENOUS | Status: AC
  Filled 2023-03-02: qty 10

## 2023-03-02 MED ORDER — ROCURONIUM BROMIDE 100 MG/10ML IV SOLN
INTRAVENOUS | Status: DC | PRN
  Administered 2023-03-02: 20 mg via INTRAVENOUS

## 2023-03-02 MED ORDER — LACTATED RINGERS IV SOLN: INTRAVENOUS | Status: DC

## 2023-03-02 MED ORDER — PHENAZOPYRIDINE HCL 200 MG PO TABS: ORAL_TABLET | ORAL | 1 refills | Status: DC

## 2023-03-02 MED ORDER — HYDROMORPHONE HCL 1 MG/ML IJ SOLN
0.2500 mg | INTRAMUSCULAR | Status: DC | PRN
  Administered 2023-03-02 (×4): 0.5 mg via INTRAVENOUS

## 2023-03-02 MED ORDER — STERILE WATER FOR IRRIGATION IR SOLN
Status: DC | PRN
  Administered 2023-03-02: 1000 mL

## 2023-03-02 MED ORDER — PROPOFOL 10 MG/ML IV BOLUS
INTRAVENOUS | Status: AC
  Filled 2023-03-02: qty 20

## 2023-03-02 MED ORDER — SENNOSIDES-DOCUSATE SODIUM 8.6-50 MG PO TABS
1.0000 | ORAL_TABLET | Freq: Every day | ORAL | Status: DC
  Administered 2023-03-02 – 2023-03-03 (×2): 1 via ORAL
  Filled 2023-03-02 (×2): qty 1

## 2023-03-02 MED ORDER — FENTANYL CITRATE (PF) 100 MCG/2ML IJ SOLN
INTRAMUSCULAR | Status: DC | PRN
  Administered 2023-03-02 (×2): 25 ug via INTRAVENOUS
  Administered 2023-03-02: 50 ug via INTRAVENOUS

## 2023-03-02 MED ORDER — DEXAMETHASONE SODIUM PHOSPHATE 10 MG/ML IJ SOLN
INTRAMUSCULAR | Status: AC
  Filled 2023-03-02: qty 1

## 2023-03-02 MED ORDER — GEMCITABINE CHEMO FOR BLADDER INSTILLATION 2000 MG
2000.0000 mg | Freq: Once | INTRAVENOUS | Status: AC
  Administered 2023-03-02: 2000 mg via INTRAVESICAL
  Filled 2023-03-02: qty 2000

## 2023-03-02 MED ORDER — ONDANSETRON HCL 4 MG/2ML IJ SOLN: 4.0000 mg | Freq: Four times a day (QID) | INTRAMUSCULAR | Status: DC | PRN

## 2023-03-02 MED ORDER — ORAL CARE MOUTH RINSE: 15.0000 mL | Freq: Once | OROMUCOSAL | Status: AC

## 2023-03-02 MED ORDER — PAROXETINE HCL 20 MG PO TABS
20.0000 mg | ORAL_TABLET | Freq: Every day | ORAL | Status: DC
  Administered 2023-03-03 – 2023-03-04 (×2): 20 mg via ORAL
  Filled 2023-03-02 (×2): qty 1

## 2023-03-02 MED ORDER — FUROSEMIDE 10 MG/ML IJ SOLN
20.0000 mg | Freq: Once | INTRAMUSCULAR | Status: AC
  Administered 2023-03-02: 20 mg via INTRAVENOUS

## 2023-03-02 MED ORDER — MELATONIN 5 MG PO TABS
10.0000 mg | ORAL_TABLET | Freq: Every day | ORAL | Status: DC
  Administered 2023-03-02 – 2023-03-03 (×2): 10 mg via ORAL
  Filled 2023-03-02 (×2): qty 2

## 2023-03-02 MED ORDER — ONDANSETRON HCL 4 MG PO TABS: 4.0000 mg | ORAL_TABLET | Freq: Four times a day (QID) | ORAL | Status: DC | PRN

## 2023-03-02 MED ORDER — CEFAZOLIN SODIUM-DEXTROSE 2-4 GM/100ML-% IV SOLN
2.0000 g | INTRAVENOUS | Status: AC
  Administered 2023-03-02: 2 g via INTRAVENOUS
  Filled 2023-03-02: qty 100

## 2023-03-02 SURGICAL SUPPLY — 28 items
BAG COUNTER SPONGE SURGICOUNT (BAG) IMPLANT
BAG DRN RND TRDRP ANRFLXCHMBR (UROLOGICAL SUPPLIES)
BAG SPNG CNTER NS LX DISP (BAG)
BAG URINE DRAIN 2000ML AR STRL (UROLOGICAL SUPPLIES) IMPLANT
BAG URO CATCHER STRL LF (MISCELLANEOUS) ×2 IMPLANT
CATH FOLEY 2WAY SLVR 18FR 30CC (CATHETERS) ×1 IMPLANT
CATH FOLEY 2WAY SLVR 30CC 24FR (CATHETERS) IMPLANT
CATH URET 5FR 70CM CONE TIP (BALLOONS) IMPLANT
CATH URET WHISTLE 5FR 28IN (CATHETERS) IMPLANT
CATH URETL OPEN END 6FR 70 (CATHETERS) ×1 IMPLANT
CLOTH BEACON ORANGE TIMEOUT ST (SAFETY) ×2 IMPLANT
DRAPE FOOT SWITCH (DRAPES) ×2 IMPLANT
EVACUATOR MICROVAS BLADDER (UROLOGICAL SUPPLIES) IMPLANT
GLOVE SURG LX STRL 8.0 MICRO (GLOVE) ×2 IMPLANT
GOWN STRL REUS W/ TWL XL LVL3 (GOWN DISPOSABLE) ×4 IMPLANT
GOWN STRL REUS W/TWL XL LVL3 (GOWN DISPOSABLE) ×4
GUIDEWIRE STR DUAL SENSOR (WIRE) ×1 IMPLANT
KIT TURNOVER KIT A (KITS) IMPLANT
LOOP CUT BIPOLAR 24F LRG (ELECTROSURGICAL) ×2 IMPLANT
MANIFOLD NEPTUNE II (INSTRUMENTS) ×2 IMPLANT
NDL SAFETY ECLIP 18X1.5 (MISCELLANEOUS) ×2 IMPLANT
PACK CYSTO (CUSTOM PROCEDURE TRAY) ×2 IMPLANT
PENCIL SMOKE EVACUATOR (MISCELLANEOUS) IMPLANT
SYR TOOMEY IRRIG 70ML (MISCELLANEOUS)
SYRINGE TOOMEY IRRIG 70ML (MISCELLANEOUS) IMPLANT
TUBING CONNECTING 10 (TUBING) ×2 IMPLANT
TUBING UROLOGY SET (TUBING) ×2 IMPLANT
WATER STERILE IRR 3000ML UROMA (IV SOLUTION) ×2 IMPLANT

## 2023-03-02 NOTE — Anesthesia Procedure Notes (Signed)
Procedure Name: Intubation Date/Time: 03/02/2023 12:13 PM  Performed by: Johnette Abraham, CRNAPre-anesthesia Checklist: Patient identified, Emergency Drugs available, Suction available and Patient being monitored Patient Re-evaluated:Patient Re-evaluated prior to induction Oxygen Delivery Method: Circle System Utilized Preoxygenation: Pre-oxygenation with 100% oxygen Induction Type: IV induction and Tracheostomy Ventilation: Mask ventilation without difficulty Tube type: Oral Number of attempts: 1 Airway Equipment and Method: Stylet, Oral airway and Tracheostomy Placement Confirmation: breath sounds checked- equal and bilateral Secured at: 4 cm Tube secured with: Tape Dental Injury: Teeth and Oropharynx as per pre-operative assessment  Comments: Pt with trach insitu, pre o@ via trach, IV induction, #6 reinforced ETT inserted atrumattically through tracheostomy, secured at 4 cm from skin, BBS=, +ETCO2

## 2023-03-02 NOTE — Discharge Instructions (Addendum)
You may see some blood in the urine and may have some burning with urination for 48-72 hours. You also may notice that you have to urinate more frequently or urgently after your procedure which is normal.  You should call should you develop an inability urinate, fever > 101, persistent nausea and vomiting that prevents you from eating or drinking to stay hydrated. If you have a catheter, you will be taught how to take care of the catheter by the nursing staff prior to discharge from the hospital.  You may periodically feel a strong urge to void with the catheter in place.  This is a bladder spasm and most often can occur when having a bowel movement or moving around. It is typically self-limited and usually will stop after a few minutes.  You may use some Vaseline or Neosporin around the tip of the catheter to reduce friction at the tip of the penis. You may also see some blood in the urine.  A very small amount of blood can make the urine look quite red.  As long as the catheter is draining well, there usually is not a problem.  However, if the catheter is not draining well and is bloody, you should call the office [phone]) to notify us.  It is okay to remove the catheter on Thursday morning as instructed by the nurses. It is okay to restart blood thinners once the urine has turned yellow

## 2023-03-02 NOTE — Op Note (Signed)
Preoperative diagnosis: Right lateral wall bladder tumor, 10 mm in diameter, bulbous urethral stricture, mild to moderate  Postoperative diagnosis: 10 mm right lateral wall bladder tumor, minimal urethral stricture  Principal procedure: Cystoscopy, TURBT of 10 mm right lateral wall bladder tumor, gemcitabine administration  Surgeon:   Anesthesia: General Endotracheal through.  Tracheostomy  Specimen: Bladder tumor fragments, to pathology  Estimated blood loss: Less than 5 mL  Complications: None  Indications: 82 year old male with recurrent/persistent gross hematuria.  Evaluation revealed the patient to have a 10 mm papillary lesion in the right bladder wall.  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  He does have a history of urethral stricture in the bulbous urethra.  In the office, flexible scope passed through this quite easily.  He presents at this time for resection of the bladder tumor, gemcitabine administration as well as possible Optilume management of his stricture.  I have discussed the procedure with the patient as well as his family.  They understand and desire to proceed.  Findings: There was a very mild bulbous urethral stricture.  It admitted the 52 French cystoscope peak without difficulty.  I did not feel Optilume management was necessary.  Prostate was nonobstructive.  I inspected the bladder circumferentially.  There was a solitary urothelial lesion on the right lateral wall.  No other urothelial lesions were noted.  Description of procedure: Patient properly identified in the holding area.  Taken to the operating room where general anesthetic was administered with endotracheal apparatus.  He was placed in the dorsolithotomy position, intravenous Ancef was administered and proper timeout performed.  21 French panendoscope advanced under direct vision through his urethra, easily passed the small/mild bulbous urethral stricture.  Bladder was inspected  circumferentially.  The lesion was seen on the right lateral wall.  No other significant abnormalities noted.  Ureteral orifices were normal in location and configuration.  Following this, the cystoscope was removed.  26 French resectoscope sheath passed using the visual obturator.  I was able to navigate this by the stricture as well.  The cutting loop was placed in the resectoscope.  The small lesion was resected into the muscular layer.  Fragments were sent for pathology.  Labeled "bladder tumor".  Base of the resection site was cauterized.  No active bleeding seen at this point.  At this point, the scope was removed.  I placed an 55 French Foley catheter, balloon filled with 10 cc of water and this was hooked to dependent drainage.  At this point, the patient was awakened and extubated.  He was taken to the PACU in stable condition, having tolerated the procedure well.  In the PACU, 2 g of gemcitabine and diluent were then instilled in the bladder.  After an hour it was drained.

## 2023-03-02 NOTE — H&P (Signed)
H&P  Chief Complaint: Bladder tumor, urethral stricture  History of Present Illness: 82 year old male presents at this time for cystoscopy, Optilume management of a bulbous urethral stricture, transurethral resection of bladder tumor as well as gemcitabine instillation.  The patient has had recurrent gross hematuria.  His last visit with me was in late June.  At that time cystoscopy revealed a papillary lesion of the bladder at least 10 mm in size as well as a significant bulbous urethral stricture.  His surgery has had to be delayed because of clearance for stopping his dual anticoagulant management as well as needing operative management in [place] rather than [place] because of his history of laryngectomy.  Past Medical History:  Diagnosis Date   Acute ST elevation myocardial infarction (STEMI) of inferior wall (HCC) 03/01/2020   [facility] [place]   Anxiety    Arthritis    Back (05/04/2018)   Carotid artery disease (HCC)    a. 04/2018 s/p R carotid stenting. b. L TCAR 07/2022.   Chronic lower back pain    "have it at night" (05/04/2018)   CKD (chronic kidney disease), stage III (HCC)    Coronary artery disease    CVA (cerebral vascular accident) (HCC)    Depression    GERD (gastroesophageal reflux disease)    High triglycerides    History of kidney stones    Hyperlipidemia LDL goal <70    Hypertension    Hypertensive crisis 08/08/2022   Laryngeal carcinoma (HCC) 1998   PAF (paroxysmal atrial fibrillation) (HCC)    Peripheral vascular disease (HCC)    Pneumonia 11/11/2021   RBBB    Sinus bradycardia    Stroke [facility])    "he's had several little strokes; many that he wasn't aware of" (05/04/2018)   TIA (transient ischemic attack)    Tobacco abuse    Tracheal stenosis    Tracheostomy in place [facility])     Past Surgical History:  Procedure Laterality Date   CAROTID STENT Right 06-13-12; 05/04/2018   CAROTID STENT INSERTION N/A 06/13/2012   Procedure: CAROTID  STENT INSERTION;  Surgeon: Nada Libman, MD;  Location: [facility] CATH LAB;  Service: Cardiovascular;  Laterality: N/A;   CATARACT EXTRACTION, BILATERAL Bilateral    COLONOSCOPY  11/10/2011   Dr. Jena Gauss: hemorrhoids, tubular adenoma   CORNEAL TRANSPLANT Bilateral    CORONARY ARTERY BYPASS GRAFT  ~ 2003   "CABG X3"   CORONARY BALLOON ANGIOPLASTY N/A 11/26/2020   Procedure: CORONARY BALLOON ANGIOPLASTY;  Surgeon: Lennette Bihari, MD;  Location: [facility] INVASIVE CV LAB;  Service: Cardiovascular;  Laterality: N/A;   CORONARY STENT INTERVENTION N/A 06/05/2020   Procedure: CORONARY STENT INTERVENTION;  Surgeon: Runell Gess, MD;  Location: [facility] INVASIVE CV LAB;  Service: Cardiovascular;  Laterality: N/A;   EYE SURGERY     INSERTION OF RETROGRADE CAROTID STENT Right 05/04/2018   Procedure: INSERTION OF RIGHT  CAROTID STENT using an ABBOT- XACT carotid stent system;  Surgeon: Nada Libman, MD;  Location: [facility] OR;  Service: Vascular;  Laterality: Right;   LAPAROSCOPIC CHOLECYSTECTOMY  2007   LEFT HEART CATH AND CORS/GRAFTS ANGIOGRAPHY N/A 10/07/2016   Procedure: Left Heart Cath and Cors/Grafts Angiography;  Surgeon: Lennette Bihari, MD;  Location: [facility] INVASIVE CV LAB;  Service: Cardiovascular;  Laterality: N/A;   LEFT HEART CATH AND CORS/GRAFTS ANGIOGRAPHY N/A 06/05/2020   Procedure: LEFT HEART CATH AND CORS/GRAFTS ANGIOGRAPHY;  Surgeon: Runell Gess, MD;  Location: [facility] INVASIVE CV LAB;  Service: Cardiovascular;  Laterality: N/A;   LEFT HEART CATH AND CORS/GRAFTS ANGIOGRAPHY N/A 11/26/2020   Procedure: LEFT HEART CATH AND CORS/GRAFTS ANGIOGRAPHY;  Surgeon: Lennette Bihari, MD;  Location: [facility] INVASIVE CV LAB;  Service: Cardiovascular;  Laterality: N/A;   PARTIAL LARYNGECTOMY  1998   TRACHEOSTOMY  03/13/2018   "@ [facility]"   TRANSCAROTID ARTERY REVASCULARIZATION  Left 08/17/2022   Procedure: Transcarotid Artery Revascularization;  Surgeon: Victorino Sparrow, MD;  Location: [facility] OR;  Service: Vascular;  Laterality:  Left;    Home Medications:    Allergies:  Allergies  Allergen Reactions   Nifedical Xl [Nifedipine] Other (See Comments)    Unknown reaction   Ativan [Lorazepam] Other (See Comments)    Altered mental state   Penicillins Hives    Has patient had a PCN reaction causing immediate rash, facial/tongue/throat swelling, SOB or lightheadedness with hypotension: Yes Has patient had a PCN reaction causing severe rash involving mucus membranes or skin necrosis: No Has patient had a PCN reaction that required hospitalization No Has patient had a PCN reaction occurring within the last 10 years: No If all of the above answers are "NO", then may proceed with Cephalosporin use.  HAS TOLERATED: cephalexin   Sulfacetamide Other (See Comments)    Unknown reaction   Sulfonamide Derivatives Other (See Comments)    Unknown reaction   Crestor [Rosuvastatin] Itching, Swelling and Rash   Erythromycin Rash   Firvanq [Vancomycin] Itching   Statins Itching, Swelling and Rash    Rash on his trunk and arms, swelling in his lips    Family History  Problem Relation Age of Onset   Dementia Mother    Heart disease Father    Cancer Brother    Heart disease Brother    Hyperlipidemia Brother    Cancer Brother    Colon cancer Neg Hx     Social History:  reports that he quit smoking about 52 years ago. His smoking use included cigarettes. He started smoking about 69 years ago. He has a 17.3 pack-year smoking history. He has never been exposed to tobacco smoke. His smokeless tobacco use includes chew. He reports that he does not drink alcohol and does not use drugs.  ROS: A complete review of systems was performed.  All systems are negative except for pertinent findings as noted.  Physical Exam:  Vital signs in last 24 hours: BP (!) 157/79 (BP Location: Right Arm)   Temp (!) 97.5 F (36.4 C) (Oral)   Resp 18   Ht 5\' 6"  (1.676 m)   Wt 81.6 kg   SpO2 97%   BMI 29.05 kg/m  Constitutional:  Alert and  oriented, No acute distress Cardiovascular: Regular rate  Respiratory: Normal respiratory effort Neurologic: Grossly intact, no focal deficits Psychiatric: Normal mood and affect  I have reviewed prior pt notes  I have reviewed notes from referring/previous physicians   Impression/Assessment:  1.  Bulbous urethral stricture, recurrent  2.  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

## 2023-03-02 NOTE — Progress Notes (Signed)
RN able to order extra trach and inner cannulas for pt.

## 2023-03-02 NOTE — Anesthesia Procedure Notes (Addendum)
Procedure Name: Intubation Date/Time: 03/02/2023 12:15 PM  Performed by: Mal Amabile, MDPre-anesthesia Checklist: Patient identified, Patient being monitored, Suction available and Emergency Drugs available Patient Re-evaluated:Patient Re-evaluated prior to induction Oxygen Delivery Method: Circle system utilized Preoxygenation: Pre-oxygenation with 100% oxygen Induction Type: IV induction Tube type: Reinforced Tube size: 6.0 mm Number of attempts: 1 Airway Equipment and Method: Tracheostomy Secured at: 4 cm Tube secured with: Tape Dental Injury: Teeth and Oropharynx as per pre-operative assessment  Comments: Fenestrated tracheostomy removed after induction of GA. 6.0 reinforced cuffed ETT placed through tracheostomy. BS= Bilaterally. + EtCO2. Tube secured at approximately 4-5cm at tracheostomy at the skin.

## 2023-03-02 NOTE — Interval H&P Note (Signed)
History and Physical Interval Note:  03/02/2023 11:22 AM  Kenneth [name]  has presented today for surgery, with the diagnosis of Urethral stricture, bladder tumor.  The various methods of treatment have been discussed with the patient and family. After consideration of risks, benefits and other options for treatment, the patient has consented to  Procedure(s): CYSTOSCOPY WITH URETHRAL DILATATION- optilume dilation (N/A) CYSTOSCOPY WITH RETROGRADE PYELOGRAM (Bilateral) TRANSURETHRAL RESECTION OF BLADDER TUMOR (TURBT)- with gemcitabine instillation (N/A) as a surgical intervention.  The patient's history has been reviewed, patient examined, no change in status, stable for surgery.  I have reviewed the patient's chart and labs.  Questions were answered to the patient's satisfaction.     [name] 

## 2023-03-02 NOTE — Transfer of Care (Signed)
Immediate Anesthesia Transfer of Care Note  Patient: Kenneth [name]  Procedure(s) Performed: TRANSURETHRAL RESECTION OF BLADDER TUMOR (TURBT)- with gemcitabine instillation  Patient Location: PACU  Anesthesia Type:General  Level of Consciousness: awake, alert , oriented, and patient cooperative  Airway & Oxygen Therapy: Patient Spontanous Breathing and Patient connected to tracheostomy mask oxygen  Post-op Assessment: Report given to RN and Post -op Vital signs reviewed and stable  Post vital signs: Reviewed and stable  Last Vitals:  Vitals Value Taken Time  BP 108/42 03/02/23 1305  Temp    Pulse 74 03/02/23 1312  Resp 25 03/02/23 1312  SpO2 93 % 03/02/23 1312  Vitals shown include unfiled device data.  Last Pain:  Vitals:   03/02/23 0930  TempSrc: Oral  PainSc: 0-No pain         Complications: No notable events documented.

## 2023-03-02 NOTE — H&P (Signed)
History and Physical  Patient: Kenneth [name] [id number]:[id number] DOB: February 18, 1941 DOA: 03/02/2023 DOS: the patient was seen and examined on 03/02/2023 Patient coming from: From home to OR  Chief Complaint: Hypoxia HPI: Kenneth [name] is a 82 y.o. male with PMH significant of CAD, PAD, CVA, laryngeal cancer SP tracheostomy, GERD, CKD 3A. Patient presents to the hospital with complaints of hematuria for which she was electively undergoing cystoscopy with urethral dilation and resection of the bladder tumor (TURBT).  Postprocedure patient becomes hypoxic and remains hypoxic and therefore hospitalist services consulted. At the time of my evaluation patient denies having any complaints of fever, chills, chest pain, headache, dizziness, nausea, vomiting prior to the events. Patient does have swelling in his legs which he tells is chronic. Patient has tracheostomy and uses Passy-Muir valve as well as his thumbs to communicate. Denies any excessive secretion or cough. Patient remains compliant with his diuretic regimen at home. Patient tells me that he does not have any shortness of breath at the time of my evaluation. Was dropping down to 85% on 2 LPM at the time of my evaluation and does not use any oxygen at his baseline.  Review of Systems: As mentioned in the history of present illness. All other systems reviewed and are negative.  Prior to Admission medications   Medication Sig Start Date End Date Taking? Authorizing Provider  acetaminophen (TYLENOL) 500 MG tablet Take 500 mg by mouth every 6 (six) hours as needed for headache, fever, moderate pain or mild pain.   Yes [provider]  amLODipine (NORVASC) 10 MG tablet Take 1 tablet (10 mg total) by mouth daily. 09/08/22 09/08/23 Yes Dunn, Tacey Ruiz, PA-C  apixaban (ELIQUIS) 2.5 MG TABS tablet Take 1 tablet (2.5 mg total) by mouth 2 (two) times daily. 10/01/22  Yes Branch, Dorothe Pea, MD  carvedilol (COREG) 12.5 MG tablet Take 1.5 tablets (18.75  mg total) by mouth 2 (two) times daily. 09/16/22 09/11/23 Yes Dunn, Dayna N, PA-C  clopidogrel (PLAVIX) 75 MG tablet Take 1 tablet (75 mg total) by mouth daily. 10/04/22  Yes Dunn, Dayna N, PA-C  Evolocumab (REPATHA SURECLICK) 140 MG/ML SOAJ Inject 140 mg into the skin every 14 (fourteen) days. 09/15/22  Yes BranchDorothe Pea, MD  ezetimibe (ZETIA) 10 MG tablet Take 1 tablet by mouth once daily 09/15/22  Yes Branch, Dorothe Pea, MD  Melatonin 10 MG TABS Take 10 mg by mouth at bedtime.   Yes [provider]  nitroGLYCERIN (NITROSTAT) 0.4 MG SL tablet Place 1 tablet (0.4 mg total) under the tongue every 5 (five) minutes as needed for chest pain. 01/31/17 05/28/23 Yes Jodelle Gross, NP  pantoprazole (PROTONIX) 40 MG tablet Take 1 tablet by mouth once daily 07/28/21  Yes Branch, Dorothe Pea, MD  PARoxetine (PAXIL) 20 MG tablet Take 20 mg by mouth daily.   Yes [provider]  phenazopyridine (PYRIDIUM) 200 MG tablet Take 1 tablet every 8 hours as needed for urinary pain 03/02/23  Yes Dahlstedt, Jeannett Senior, MD  spironolactone (ALDACTONE) 25 MG tablet Take 1 tablet (25 mg total) by mouth daily. 09/16/22 09/11/23 Yes Dunn, Tacey Ruiz, PA-C    Past Medical History:  Diagnosis Date   Acute ST elevation myocardial infarction (STEMI) of inferior wall (HCC) 03/01/2020   [facility] [place]   Anxiety    Arthritis    Back (05/04/2018)   Carotid artery disease (HCC)    a. 04/2018 s/p R carotid stenting. b. L TCAR 07/2022.  Chronic lower back pain    "have it at night" (05/04/2018)   CKD (chronic kidney disease), stage III (HCC)    Coronary artery disease    CVA (cerebral vascular accident) (HCC)    Depression    GERD (gastroesophageal reflux disease)    High triglycerides    History of kidney stones    Hyperlipidemia LDL goal <70    Hypertension    Hypertensive crisis 08/08/2022   Laryngeal carcinoma (HCC) 1998   PAF (paroxysmal atrial fibrillation) (HCC)    Peripheral vascular  disease (HCC)    Pneumonia 11/11/2021   RBBB    Sinus bradycardia    Stroke [facility])    "he's had several little strokes; many that he wasn't aware of" (05/04/2018)   TIA (transient ischemic attack)    Tobacco abuse    Tracheal stenosis    Tracheostomy in place [facility])    Past Surgical History:  Procedure Laterality Date   CAROTID STENT Right 06-13-12; 05/04/2018   CAROTID STENT INSERTION N/A 06/13/2012   Procedure: CAROTID STENT INSERTION;  Surgeon: Nada Libman, MD;  Location: [facility] CATH LAB;  Service: Cardiovascular;  Laterality: N/A;   CATARACT EXTRACTION, BILATERAL Bilateral    COLONOSCOPY  11/10/2011   Dr. Jena Gauss: hemorrhoids, tubular adenoma   CORNEAL TRANSPLANT Bilateral    CORONARY ARTERY BYPASS GRAFT  ~ 2003   "CABG X3"   CORONARY BALLOON ANGIOPLASTY N/A 11/26/2020   Procedure: CORONARY BALLOON ANGIOPLASTY;  Surgeon: Lennette Bihari, MD;  Location: [facility] INVASIVE CV LAB;  Service: Cardiovascular;  Laterality: N/A;   CORONARY STENT INTERVENTION N/A 06/05/2020   Procedure: CORONARY STENT INTERVENTION;  Surgeon: Runell Gess, MD;  Location: [facility] INVASIVE CV LAB;  Service: Cardiovascular;  Laterality: N/A;   EYE SURGERY     INSERTION OF RETROGRADE CAROTID STENT Right 05/04/2018   Procedure: INSERTION OF RIGHT  CAROTID STENT using an ABBOT- XACT carotid stent system;  Surgeon: Nada Libman, MD;  Location: [facility] OR;  Service: Vascular;  Laterality: Right;   LAPAROSCOPIC CHOLECYSTECTOMY  2007   LEFT HEART CATH AND CORS/GRAFTS ANGIOGRAPHY N/A 10/07/2016   Procedure: Left Heart Cath and Cors/Grafts Angiography;  Surgeon: Lennette Bihari, MD;  Location: [facility] INVASIVE CV LAB;  Service: Cardiovascular;  Laterality: N/A;   LEFT HEART CATH AND CORS/GRAFTS ANGIOGRAPHY N/A 06/05/2020   Procedure: LEFT HEART CATH AND CORS/GRAFTS ANGIOGRAPHY;  Surgeon: Runell Gess, MD;  Location: [facility] INVASIVE CV LAB;  Service: Cardiovascular;  Laterality: N/A;   LEFT HEART CATH AND CORS/GRAFTS ANGIOGRAPHY N/A  11/26/2020   Procedure: LEFT HEART CATH AND CORS/GRAFTS ANGIOGRAPHY;  Surgeon: Lennette Bihari, MD;  Location: [facility] INVASIVE CV LAB;  Service: Cardiovascular;  Laterality: N/A;   PARTIAL LARYNGECTOMY  1998   TRACHEOSTOMY  03/13/2018   "@ [facility]"   TRANSCAROTID ARTERY REVASCULARIZATION  Left 08/17/2022   Procedure: Transcarotid Artery Revascularization;  Surgeon: Victorino Sparrow, MD;  Location[facility] OR;  Service: Vascular;  Laterality: Left;   Social History:  reports that he quit smoking about 52 years ago. His smoking use included cigarettes. He started smoking about 69 years ago. He has a 17.3 pack-year smoking history. He has never been exposed to tobacco smoke. His smokeless tobacco use includes chew. He reports that he does not drink alcohol and does not use drugs. Allergies  Allergen Reactions   Nifedical Xl [Nifedipine] Other (See Comments)    Unknown reaction   Ativan [Lorazepam] Other (See Comments)    Altered mental state  Penicillins Hives    Has patient had a PCN reaction causing immediate rash, facial/tongue/throat swelling, SOB or lightheadedness with hypotension: Yes Has patient had a PCN reaction causing severe rash involving mucus membranes or skin necrosis: No Has patient had a PCN reaction that required hospitalization No Has patient had a PCN reaction occurring within the last 10 years: No If all of the above answers are "NO", then may proceed with Cephalosporin use.  HAS TOLERATED: cephalexin   Sulfacetamide Other (See Comments)    Unknown reaction   Sulfonamide Derivatives Other (See Comments)    Unknown reaction   Crestor [Rosuvastatin] Itching, Swelling and Rash   Erythromycin Rash   Firvanq [Vancomycin] Itching   Statins Itching, Swelling and Rash    Rash on his trunk and arms, swelling in his lips   Family History  Problem Relation Age of Onset   Dementia Mother    Heart disease Father    Cancer Brother    Heart disease Brother    Hyperlipidemia Brother     Cancer Brother    Colon cancer Neg Hx    Physical Exam: Vitals:   03/02/23 1700 03/02/23 1715 03/02/23 1730 03/02/23 1800  BP: 124/65 (!) 143/75 119/81 115/71  Pulse: 64 68 68 65  Resp: 14 16 13 12   Temp:      TempSrc:      SpO2: 92% 92% 93% 90%  Weight:      Height:       General: Appear in mild distress; no visible Abnormal Neck Mass Or lumps, Conjunctiva normal Cardiovascular: S1 and S2 Present, no Murmur, Respiratory: good respiratory effort, Bilateral Air entry present and faint basilar crackles, no wheezes Abdomen: Bowel Sound present, Non tender  Extremities: bilateral  Pedal edema Neurology: alert and oriented to time, place, and person Gait not checked due to patient safety concerns   Data Reviewed: I have reviewed ED notes, Vitals, Lab results and outpatient records. Since last encounter, pertinent lab results CBC and BMP   . I have ordered test including CBC BMP  . I have discussed pt's care plan and test results with urology  . I have independently visualized and interpreted imaging chest x-ray which showed vascular congestion  .   Assessment and Plan Hypoxia Still needing 2 L of oxygen. Most likely postoperative volume overload. Receiving IV Lasix.  On several monitor but recheck remains at the spirometry pretension  SP TURBT. Management per urology prevention patient does have some blood in his urine. Currently has a Foley catheter which I will continue. Per RN plan was to remove the Foley catheter on Thursday.  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 Likely the reason for patient's presentation with hypoxia for Continues with the spirometry but Currently no evidence of an exacerbation.  Chronic HFpEF. Echo in January 55 to 60%. RV normal Has chronic leg swelling. Will treat with IV Lasix and monitor weight On Aldactone.  CAD S/P percutaneous coronary angioplasty Carotid arterial disease (HCC) Essential hypertension Holding  Norvasc for tonight but Resuming home regimen tomorrow.  Constipation Continue bowel regimen.  Tracheostomy dependent (HCC) Continue to use trach collar.  Chronic kidney disease, stage 3a (HCC) Renal function pending.  Last BMP showed stable creatinine on 7/15. Recheck before giving Lasix.  GERD (gastroesophageal reflux disease) Continue PPI.  History of laryngeal cancer Monitor.  Outpatient follow-up.  PAF  Holding apixaban for now.  In sinus.  Advance Care Planning:   Code Status: Full Code no one at bedside  Consults: Urology Family Communication: No one at bedside  Author: Lynden Oxford, MD 03/02/2023 6:34 PM For on call review [email or web address].

## 2023-03-02 NOTE — Anesthesia Postprocedure Evaluation (Signed)
Anesthesia Post Note  Patient: Kenneth [name]  Procedure(s) Performed: TRANSURETHRAL RESECTION OF BLADDER TUMOR (TURBT)- with gemcitabine instillation     Patient location during evaluation: PACU Anesthesia Type: General Level of consciousness: awake and sedated Pain management: pain level controlled Vital Signs Assessment: post-procedure vital signs reviewed and stable Respiratory status: spontaneous breathing and patient connected to tracheostomy mask oxygen Cardiovascular status: stable Postop Assessment: no apparent nausea or vomiting Anesthetic complications: no  No notable events documented.  Last Vitals:  Vitals:   03/02/23 1700 03/02/23 1715  BP: 124/65 (!) 143/75  Pulse: 64 68  Resp: 14 16  Temp:    SpO2: 92% 92%    Last Pain:  Vitals:   03/02/23 1715  TempSrc:   PainSc: 0-No pain                 Caren Macadam

## 2023-03-03 ENCOUNTER — Observation Stay (HOSPITAL_COMMUNITY): Payer: Medicare HMO

## 2023-03-03 ENCOUNTER — Encounter (HOSPITAL_COMMUNITY): Payer: Self-pay | Admitting: Urology

## 2023-03-03 DIAGNOSIS — I5032 Chronic diastolic (congestive) heart failure: Secondary | ICD-10-CM | POA: Diagnosis present

## 2023-03-03 DIAGNOSIS — J8410 Pulmonary fibrosis, unspecified: Secondary | ICD-10-CM | POA: Diagnosis present

## 2023-03-03 DIAGNOSIS — I252 Old myocardial infarction: Secondary | ICD-10-CM | POA: Diagnosis not present

## 2023-03-03 DIAGNOSIS — J9589 Other postprocedural complications and disorders of respiratory system, not elsewhere classified: Secondary | ICD-10-CM | POA: Diagnosis not present

## 2023-03-03 DIAGNOSIS — I779 Disorder of arteries and arterioles, unspecified: Secondary | ICD-10-CM | POA: Diagnosis present

## 2023-03-03 DIAGNOSIS — N35912 Unspecified bulbous urethral stricture, male: Secondary | ICD-10-CM | POA: Diagnosis present

## 2023-03-03 DIAGNOSIS — K5900 Constipation, unspecified: Secondary | ICD-10-CM | POA: Diagnosis present

## 2023-03-03 DIAGNOSIS — I2510 Atherosclerotic heart disease of native coronary artery without angina pectoris: Secondary | ICD-10-CM | POA: Diagnosis present

## 2023-03-03 DIAGNOSIS — Z7901 Long term (current) use of anticoagulants: Secondary | ICD-10-CM | POA: Diagnosis not present

## 2023-03-03 DIAGNOSIS — I739 Peripheral vascular disease, unspecified: Secondary | ICD-10-CM | POA: Diagnosis present

## 2023-03-03 DIAGNOSIS — J479 Bronchiectasis, uncomplicated: Secondary | ICD-10-CM | POA: Diagnosis present

## 2023-03-03 DIAGNOSIS — E781 Pure hyperglyceridemia: Secondary | ICD-10-CM | POA: Diagnosis present

## 2023-03-03 DIAGNOSIS — Z930 Tracheostomy status: Secondary | ICD-10-CM | POA: Diagnosis not present

## 2023-03-03 DIAGNOSIS — C672 Malignant neoplasm of lateral wall of bladder: Secondary | ICD-10-CM | POA: Diagnosis present

## 2023-03-03 DIAGNOSIS — I480 Paroxysmal atrial fibrillation: Secondary | ICD-10-CM | POA: Diagnosis present

## 2023-03-03 DIAGNOSIS — R0902 Hypoxemia: Secondary | ICD-10-CM | POA: Diagnosis not present

## 2023-03-03 DIAGNOSIS — Z947 Corneal transplant status: Secondary | ICD-10-CM | POA: Diagnosis not present

## 2023-03-03 DIAGNOSIS — Z951 Presence of aortocoronary bypass graft: Secondary | ICD-10-CM | POA: Diagnosis not present

## 2023-03-03 DIAGNOSIS — Z95828 Presence of other vascular implants and grafts: Secondary | ICD-10-CM | POA: Diagnosis not present

## 2023-03-03 DIAGNOSIS — N3289 Other specified disorders of bladder: Secondary | ICD-10-CM | POA: Diagnosis not present

## 2023-03-03 DIAGNOSIS — D494 Neoplasm of unspecified behavior of bladder: Secondary | ICD-10-CM | POA: Diagnosis present

## 2023-03-03 DIAGNOSIS — N1831 Chronic kidney disease, stage 3a: Secondary | ICD-10-CM | POA: Diagnosis present

## 2023-03-03 DIAGNOSIS — K219 Gastro-esophageal reflux disease without esophagitis: Secondary | ICD-10-CM | POA: Diagnosis present

## 2023-03-03 DIAGNOSIS — I130 Hypertensive heart and chronic kidney disease with heart failure and stage 1 through stage 4 chronic kidney disease, or unspecified chronic kidney disease: Secondary | ICD-10-CM | POA: Diagnosis present

## 2023-03-03 DIAGNOSIS — Z8701 Personal history of pneumonia (recurrent): Secondary | ICD-10-CM | POA: Diagnosis not present

## 2023-03-03 MED ORDER — HYDROCODONE-ACETAMINOPHEN 5-325 MG PO TABS
1.0000 | ORAL_TABLET | Freq: Four times a day (QID) | ORAL | Status: DC | PRN
  Administered 2023-03-03 (×3): 1 via ORAL
  Filled 2023-03-03 (×3): qty 1

## 2023-03-03 MED ORDER — OXYBUTYNIN CHLORIDE 5 MG PO TABS
5.0000 mg | ORAL_TABLET | Freq: Three times a day (TID) | ORAL | Status: DC | PRN
  Administered 2023-03-03: 5 mg via ORAL
  Filled 2023-03-03: qty 1

## 2023-03-03 MED ORDER — HYOSCYAMINE SULFATE 0.125 MG PO TBDP: 0.1250 mg | ORAL_TABLET | ORAL | Status: DC | PRN

## 2023-03-03 MED ORDER — APIXABAN 2.5 MG PO TABS
2.5000 mg | ORAL_TABLET | Freq: Two times a day (BID) | ORAL | Status: DC
  Administered 2023-03-03 – 2023-03-04 (×2): 2.5 mg via ORAL
  Filled 2023-03-03 (×2): qty 1

## 2023-03-03 MED ORDER — CHLORHEXIDINE GLUCONATE CLOTH 2 % EX PADS
6.0000 | MEDICATED_PAD | Freq: Every day | CUTANEOUS | Status: DC
  Administered 2023-03-03 – 2023-03-04 (×2): 6 via TOPICAL

## 2023-03-03 MED ORDER — FUROSEMIDE 10 MG/ML IJ SOLN
20.0000 mg | Freq: Once | INTRAMUSCULAR | Status: AC
  Administered 2023-03-03: 20 mg via INTRAVENOUS
  Filled 2023-03-03: qty 2

## 2023-03-03 MED ORDER — HYOSCYAMINE SULFATE 0.125 MG SL SUBL
0.1250 mg | SUBLINGUAL_TABLET | SUBLINGUAL | Status: DC | PRN
  Administered 2023-03-03: 0.125 mg via SUBLINGUAL
  Filled 2023-03-03: qty 1

## 2023-03-03 NOTE — Progress Notes (Signed)
Transition of Care [facility]) - Inpatient Brief Assessment   Patient Details  Name: Kenneth [name] MRN: [id number] Date of Birth: [birth year]  Transition of Care [facility]) CM/SW Contact:    Larrie Kass, LCSW Phone Number: 03/03/2023, 4:09 PM    Transition of Care Asessment: Insurance and Status: Insurance coverage has been reviewed Patient has primary care physician: Yes Home environment has been reviewed: yes   Prior/Current Home Services: No current home services Social Determinants of Health Reivew: SDOH reviewed no interventions necessary Readmission risk has been reviewed: Yes Transition of care needs: no transition of care needs at this time

## 2023-03-03 NOTE — Hospital Course (Signed)
Brief hospital course: Kenneth [name] is a 82 y.o. male with PMH significant of CAD, PAD, CVA, laryngeal cancer SP tracheostomy, GERD, CKD 3A. Patient presents to the hospital with complaints of hematuria for which she was electively undergoing cystoscopy with urethral dilation and resection of the bladder tumor (TURBT).  Postprocedure patient becomes hypoxic and remains hypoxic and therefore hospitalist services consulted. Currently being treated with pulmonary toilet as well as diuresis Assessment and Plan: Hypoxia Does not use oxygen at baseline.  Saturation dropped down to 87% on exertion today.  Currently at 28% FiO2 via trach collar. Most likely postoperative volume overload. Receiving IV Lasix. Continue incentive spirometer as well as flutter valve.  S/P TURBT. Management per urology prevention patient does have some blood in his urine. Foley cath removed per urology. Continue medication for bladder spasm.   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 Likely the reason for patient's presentation with hypoxia for Continues with the spirometry Currently no evidence of an exacerbation.  Suspect he may have chronic hypoxia which is just evident now that we are monitoring it.   Chronic HFpEF. Echo in January 55 to 60%. RV normal Has chronic leg swelling.  Left more than right. Improving swelling with Lasix. On Aldactone.   CAD S/P percutaneous coronary angioplasty Carotid arterial disease (HCC) Essential hypertension Holding Norvasc for tonight but Resuming home regimen tomorrow.   Constipation Continue bowel regimen.   Tracheostomy dependent (HCC) Continue to use trach collar.   Chronic kidney disease, stage 3a (HCC) Renal function pending.  Last BMP showed stable creatinine on 7/15. Recheck before giving Lasix.   GERD (gastroesophageal reflux disease) Continue PPI.   History of laryngeal cancer Monitor.  Outpatient follow-up.   PAF  Resume  apixaban for now.  In sinus rhythm

## 2023-03-03 NOTE — Plan of Care (Signed)
  Problem: Clinical Measurements: Goal: Ability to maintain clinical measurements within normal limits will improve Outcome: Progressing Goal: Respiratory complications will improve Outcome: Progressing   Problem: Elimination: Goal: Will not experience complications related to urinary retention Outcome: Progressing

## 2023-03-03 NOTE — Care Management Obs Status (Signed)
MEDICARE OBSERVATION STATUS NOTIFICATION   Patient Details  Name: Kenneth [name] MRN: [id number] Date of Birth: Mar 31, 1941   Medicare Observation Status Notification Given:  Yes    Larrie Kass, LCSW 03/03/2023, 3:57 PM

## 2023-03-03 NOTE — Progress Notes (Signed)
RT instructed patient and family on the use of a flutter valve with an omniflex adapter. Patient able to demonstrate back good technique. Patient suctioned his trach following use of the flutter valve.

## 2023-03-03 NOTE — Progress Notes (Signed)
   1 Day Post-Op Subjective: NAEON. Some bladder spasm this morning. Met with pt and son at the bedside. All questions answered to their satisfaction  Objective: Vital signs in last 24 hours: Temp:  [97.5 F (36.4 C)-98.5 F (36.9 C)] 98.4 F (36.9 C) (08/08 0927) Pulse Rate:  [59-84] 80 (08/08 0927) Resp:  [10-21] 18 (08/08 0820) BP: (108-150)/(42-95) 128/66 (08/08 0927) SpO2:  [83 %-100 %] 100 % (08/08 0927) FiO2 (%):  [28 %-30 %] 28 % (08/08 0820) Weight:  [83.6 kg] 83.6 kg (08/08 0422)  Assessment/Plan: #Bladder cancer S/p TURBT with Dr. Retta Diones on 03/02/2023. Mild hematuria noted this morning, expected finding postoperatively with no accumulation of clot material.  Patient is experiencing significant spasm and discomfort so we will proceed with voiding trial this morning. I will provide him with as needed hyoscyamine and Pyridium while he remains in hospital.  Antispasmodics have already been called in for him outpatient. Okay to start Eliquis at the discretion of medical team. Okay to discharge from urologic perspective following voiding trial. Follow-up with [facility] as scheduled.  Intake/Output from previous day: 08/07 0701 - 08/08 0700 In: 750 [I.V.:650; IV Piggyback:100] Out: 850 [Urine:850]  Intake/Output this shift: Total I/O In: 240 [P.O.:240] Out: -   Physical Exam:  General: Alert and oriented CV: No cyanosis Lungs: equal chest rise Gu: Foley in place draining clear light red urine  Lab Results: Recent Labs    03/02/23 2120 03/03/23 0437  HGB 11.9* 11.6*  HCT 38.2* 36.8*   BMET Recent Labs    03/02/23 2120 03/03/23 0437  NA 134* 135  K 4.8 4.5  CL 102 101  CO2 23 21*  GLUCOSE 167* 149*  BUN 26* 28*  CREATININE 1.82* 1.74*  CALCIUM 8.6* 8.8*     Studies/Results: DG Chest 2 View  Result Date: 03/03/2023 CLINICAL DATA:  Pneumonia. EXAM: CHEST - 2 VIEW COMPARISON:  March 02, 2023. FINDINGS: Stable cardiomegaly. Sternotomy wires  are noted. Tracheostomy is unchanged. Elevated right hemidiaphragm is noted. Mildly decreased right midlung opacity is noted suggesting improving pneumonia or atelectasis. Stable left midlung and basilar opacities are noted. Bony thorax is unremarkable. IMPRESSION: Improved right midlung opacity as noted above. Stable left lung opacities. Electronically Signed   By: Lupita Raider M.D.   On: 03/03/2023 09:20   DG CHEST PORT 1 VIEW  Result Date: 03/02/2023 CLINICAL DATA:  Shortness of breath.  Tracheostomy EXAM: PORTABLE CHEST 1 VIEW COMPARISON:  X-ray and CT angiogram 02/07/2023 FINDINGS: Poor inflation. Sternal wires. Enlarged cardiopericardial silhouette with some vascular congestion. Tracheostomy tube. Surgical changes overlying the mediastinum. There is some patchy opacity in the right mid to lower lung. Subtle infiltrates possible. No pneumothorax. Overlapping cardiac leads. IMPRESSION: Postop chest with tracheostomy tube. Poor inflation with some developing patchy right midlung opacity. Subtle infiltrates possible. Recommend follow-up. Electronically Signed   By: Karen Kays M.D.   On: 03/02/2023 18:17   DG C-Arm 1-60 Min-No Report  Result Date: 03/02/2023 Fluoroscopy was utilized by the requesting physician.  No radiographic interpretation.      LOS: 0 days   Elmon Kirschner, NP [facility] Pager: [phone]  03/03/2023, 9:55 AM

## 2023-03-03 NOTE — Progress Notes (Signed)
[facility] Progress Note Patient: Kenneth [name] WUJ:[id number] DOB: May 24, 1941 DOA: 03/02/2023  DOS: the patient was seen and examined on 03/03/2023  Brief hospital course: Kenneth [name] is a 82 y.o. male with PMH significant of CAD, PAD, CVA, laryngeal cancer SP tracheostomy, GERD, CKD 3A. Patient presents to the hospital with complaints of hematuria for which she was electively undergoing cystoscopy with urethral dilation and resection of the bladder tumor (TURBT).  Postprocedure patient becomes hypoxic and remains hypoxic and therefore hospitalist services consulted. Currently being treated with pulmonary toilet as well as diuresis Assessment and Plan: Hypoxia Does not use oxygen at baseline.  Saturation dropped down to 87% on exertion today.  Currently at 28% FiO2 via trach collar. Most likely postoperative volume overload. Receiving IV Lasix. Continue incentive spirometer as well as flutter valve.  S/P TURBT. Management per urology prevention patient does have some blood in his urine. Foley cath removed per urology. Continue medication for bladder spasm.   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 Likely the reason for patient's presentation with hypoxia for Continues with the spirometry Currently no evidence of an exacerbation.  Suspect he may have chronic hypoxia which is just evident now that we are monitoring it.   Chronic HFpEF. Echo in January 55 to 60%. RV normal Has chronic leg swelling.  Left more than right. Improving swelling with Lasix. On Aldactone.   CAD S/P percutaneous coronary angioplasty Carotid arterial disease (HCC) Essential hypertension Holding Norvasc for tonight but Resuming home regimen tomorrow.   Constipation Continue bowel regimen.   Tracheostomy dependent (HCC) Continue to use trach collar.   Chronic kidney disease, stage 3a (HCC) Renal function pending.  Last BMP showed stable creatinine on 7/15. Recheck before  giving Lasix.   GERD (gastroesophageal reflux disease) Continue PPI.   History of laryngeal cancer Monitor.  Outpatient follow-up.   PAF  Resume apixaban for now.  In sinus rhythm   Subjective: Reports penile pain.  No nausea no vomiting no fever no chills.  Breathing better.  Denies any cough.  Physical Exam: General: in Mild distress, No Rash Cardiovascular: S1 and S2 Present, No Murmur Respiratory: Good respiratory effort, Bilateral Air entry present.  Bilateral basal crackles, No wheezes Abdomen: Bowel Sound present, No tenderness Extremities: Improving bilateral edema Neuro: Alert and oriented x3, no new focal deficit  Data Reviewed: I have Reviewed nursing notes, Vitals, and Lab results. Since last encounter, pertinent lab results CBC and BMP   . I have ordered test including CBC and BMP  .  Discussed with urology  Disposition: Status is: Inpatient Remains inpatient appropriate because: Still hypoxic needing further therapy.  SCDs Start: 03/02/23 1718 Place TED hose Start: 03/02/23 1718   Family Communication: Son at bedside Level of care: Med-Surg   Vitals:   03/03/23 1334 03/03/23 1400 03/03/23 1402 03/03/23 1551  BP: 136/65     Pulse: 77   73  Resp: (!) 24   18  Temp: 98.7 F (37.1 C)     TempSrc: Oral     SpO2: 98% (!) 87% 93% 94%  Weight:      Height:         Author: Lynden Oxford, MD 03/03/2023 4:40 PM  Please look on [email or web address] to find out who is on call.

## 2023-03-03 NOTE — Progress Notes (Signed)
ANTICOAGULATION CONSULT NOTE   Pharmacy Consult for xarelto  Indication: hx CVA and afib  Allergies  Allergen Reactions   Nifedical Xl [Nifedipine] Other (See Comments)    Unknown reaction   Ativan [Lorazepam] Other (See Comments)    Altered mental state   Penicillins Hives    Has patient had a PCN reaction causing immediate rash, facial/tongue/throat swelling, SOB or lightheadedness with hypotension: Yes Has patient had a PCN reaction causing severe rash involving mucus membranes or skin necrosis: No Has patient had a PCN reaction that required hospitalization No Has patient had a PCN reaction occurring within the last 10 years: No If all of the above answers are "NO", then may proceed with Cephalosporin use.  HAS TOLERATED: cephalexin   Sulfacetamide Other (See Comments)    Unknown reaction   Sulfonamide Derivatives Other (See Comments)    Unknown reaction   Crestor [Rosuvastatin] Itching, Swelling and Rash   Erythromycin Rash   Firvanq [Vancomycin] Itching   Statins Itching, Swelling and Rash    Rash on his trunk and arms, swelling in his lips    Patient Measurements: Height: 5\' 6"  (167.6 cm) Weight: 83.6 kg (184 lb 4.9 oz) IBW/kg (Calculated) : 63.8 Heparin Dosing Weight:   Vital Signs: Temp: 98.7 F (37.1 C) (08/08 1334) Temp Source: Oral (08/08 1334) BP: 136/65 (08/08 1334) Pulse Rate: 73 (08/08 1551)  Labs: Recent Labs    03/02/23 2120 03/03/23 0437  HGB 11.9* 11.6*  HCT 38.2* 36.8*  PLT 282 272  CREATININE 1.82* 1.74*    Estimated Creatinine Clearance: 32.6 mL/min (A) (by C-G formula based on SCr of 1.74 mg/dL (H)).   Medications:  - on eliquis 2.5 mg bid PTA   Assessment: Patient is an 82 y.o M with hx CVA and afib on Eliquis PTA who presented to [facility] on 03/02/23 for TRUBT and gemcitabine administration for right bladder wall tumor. Pharmacy has been consulted to resume Eliquis back on 03/03/23.  Today, 03/03/2023: - scr 1.74 - cbc ok   Plan:  -   resume Eliquis 2.5 mg bid - pharmacy will sign off for Elliquis, but will follow pt peripherally along with you.  Reconsult [place] if need further assistance.   Dorna Leitz P 03/03/2023,5:14 PM

## 2023-03-04 DIAGNOSIS — R0902 Hypoxemia: Secondary | ICD-10-CM | POA: Diagnosis not present

## 2023-03-04 MED ORDER — LEVALBUTEROL TARTRATE 45 MCG/ACT IN AERO: 1.0000 | INHALATION_SPRAY | RESPIRATORY_TRACT | 0 refills | Status: DC | PRN

## 2023-03-04 MED ORDER — IPRATROPIUM-ALBUTEROL 0.5-2.5 (3) MG/3ML IN SOLN: 3.0000 mL | RESPIRATORY_TRACT | Status: DC | PRN

## 2023-03-04 MED ORDER — POLYETHYLENE GLYCOL 3350 17 G PO PACK
17.0000 g | PACK | Freq: Every day | ORAL | Status: DC
  Administered 2023-03-04: 17 g via ORAL
  Filled 2023-03-04: qty 1

## 2023-03-04 MED ORDER — HYDROCODONE-ACETAMINOPHEN 5-325 MG PO TABS: 1.0000 | ORAL_TABLET | Freq: Four times a day (QID) | ORAL | 0 refills | Status: DC | PRN

## 2023-03-04 MED ORDER — SENNOSIDES-DOCUSATE SODIUM 8.6-50 MG PO TABS
2.0000 | ORAL_TABLET | Freq: Two times a day (BID) | ORAL | Status: DC
  Administered 2023-03-04: 2 via ORAL
  Filled 2023-03-04: qty 2

## 2023-03-04 MED ORDER — HYOSCYAMINE SULFATE 0.125 MG SL SUBL: 0.1250 mg | SUBLINGUAL_TABLET | SUBLINGUAL | 0 refills | Status: DC | PRN

## 2023-03-04 MED ORDER — DOCUSATE SODIUM 100 MG PO CAPS: 100.0000 mg | ORAL_CAPSULE | Freq: Two times a day (BID) | ORAL | 0 refills | Status: DC

## 2023-03-04 MED ORDER — POLYETHYLENE GLYCOL 3350 17 G PO PACK: 17.0000 g | PACK | Freq: Every day | ORAL | 0 refills | Status: DC

## 2023-03-04 NOTE — Progress Notes (Signed)
SATURATION QUALIFICATIONS: (This note is used to comply with regulatory documentation for home oxygen)  Patient Saturations on Room Air at Rest = 98%  Patient Saturations on Room Air while Ambulating = 90%  Patient Saturations on 0 Liters of oxygen while Ambulating = 90%  Please briefly explain why patient needs home oxygen:

## 2023-03-05 ENCOUNTER — Other Ambulatory Visit: Payer: Self-pay | Admitting: Cardiology

## 2023-03-05 NOTE — Discharge Summary (Signed)
Physician Discharge Summary   Patient: Kenneth [name] MRN: [id number] DOB: 09-Oct-1940  Admit date:     03/02/2023  Discharge date: 03/04/2023  Discharge Physician: Lynden Oxford  PCP: [name], MD  Recommendations at discharge: Follow-up with PCP in 1 week. Establish care with pulmonary and nephrology. Follow-up with urology as recommended.   Follow-up Information     [name], MD Follow up on 03/22/2023.   Specialty: Urology Contact information: [address]  [place] [address] [phone]         [name], MD. Schedule an appointment as soon as possible for a visit in 1 week(s).   Specialty: Internal Medicine Why: with BMP lab to look at kidney and electrolytes, with CBC lab to look at blood counts Contact information: [address] [place] [address] [phone]                Discharge Diagnoses: Principal Problem:   Hypoxia Active Problems:   CAD S/P percutaneous coronary angioplasty   Carotid arterial disease (HCC)   Essential hypertension   Constipation   Tracheostomy dependent (HCC)   Chronic kidney disease, stage 3a (HCC)   GERD (gastroesophageal reflux disease)   History of laryngeal cancer  Hospital Course: Brief hospital course: [name] is a 82 y.o. male with PMH significant of CAD, PAD, CVA, laryngeal cancer SP tracheostomy, GERD, CKD 3A. Patient presents to the hospital with complaints of hematuria for which she was electively undergoing cystoscopy with urethral dilation and resection of the bladder tumor (TURBT).  Postprocedure patient becomes hypoxic and remains hypoxic and therefore hospitalist services consulted. Was treated with pulmonary toilet as well as diuresis for hypoxia.  Assessment and Plan: Hypoxia Does not use oxygen at baseline. Saturation was low but now improved on exertion. Continue incentive spirometry and flutter valve on discharge. Treated with IV Lasix. Most  likely postoperative volume overload.  S/P TURBT. Management per urology prevention patient does have some blood in his urine. Foley cath removed per urology. Continue medication for bladder spasm.   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 Likely the reason for patient's presentation with hypoxia for Continues with the spirometry Currently no evidence of an exacerbation.  Suspect he may have chronic hypoxia which is just evident now that we are monitoring it. Outpatient referral to pulmonary recommended.  Chronic HFpEF. Echo in January 55 to 60%. RV normal Has chronic leg swelling.  Left more than right. Improving swelling with Lasix. On Aldactone.   CAD S/P percutaneous coronary angioplasty Carotid arterial disease (HCC) Essential hypertension Continue home regimen.  Constipation Continue bowel regimen.   Tracheostomy dependent (HCC) Continue to use trach collar.   Chronic kidney disease, stage 3a (HCC) Renal function pending.  Last BMP showed stable creatinine on 7/15. Renal function mildly trending up although good urine output and in no acute distress and oral intake is adequate. Outpatient follow-up with nephrology recommended.   GERD (gastroesophageal reflux disease) Continue PPI.   History of laryngeal cancer Monitor.  Outpatient follow-up.   PAF  Resume apixaban for now.  In sinus rhythm  Pain control - [employer] Controlled Substance Reporting System database was reviewed. and patient was instructed, not to drive, operate heavy machinery, perform activities at heights, swimming or participation in water activities or provide baby-sitting services while on Pain, Sleep and Anxiety Medications; until their outpatient Physician has advised to do so again. Also recommended to not to take more than prescribed Pain, Sleep and Anxiety  Medications.  Consultants:  Urology  Procedures performed:  TURBT  DISCHARGE MEDICATION: Allergies as of  03/04/2023       Reactions   Nifedical Xl [nifedipine] Other (See Comments)   Unknown reaction   Ativan [lorazepam] Other (See Comments)   Altered mental state   Penicillins Hives   Has patient had a PCN reaction causing immediate rash, facial/tongue/throat swelling, SOB or lightheadedness with hypotension: Yes Has patient had a PCN reaction causing severe rash involving mucus membranes or skin necrosis: No Has patient had a PCN reaction that required hospitalization No Has patient had a PCN reaction occurring within the last 10 years: No If all of the above answers are "NO", then may proceed with Cephalosporin use. HAS TOLERATED: cephalexin   Sulfacetamide Other (See Comments)   Unknown reaction   Sulfonamide Derivatives Other (See Comments)   Unknown reaction   Crestor [rosuvastatin] Itching, Swelling, Rash   Erythromycin Rash   Firvanq [vancomycin] Itching   Statins Itching, Swelling, Rash   Rash on his trunk and arms, swelling in his lips        Medication List     TAKE these medications    acetaminophen 500 MG tablet Commonly known as: TYLENOL Take 500 mg by mouth every 6 (six) hours as needed for headache, fever, moderate pain or mild pain.   amLODipine 10 MG tablet Commonly known as: NORVASC Take 1 tablet (10 mg total) by mouth daily.   apixaban 2.5 MG Tabs tablet Commonly known as: ELIQUIS Take 1 tablet (2.5 mg total) by mouth 2 (two) times daily.   carvedilol 12.5 MG tablet Commonly known as: COREG Take 1.5 tablets (18.75 mg total) by mouth 2 (two) times daily.   clopidogrel 75 MG tablet Commonly known as: PLAVIX Take 1 tablet (75 mg total) by mouth daily.   docusate sodium 100 MG capsule Commonly known as: Colace Take 1 capsule (100 mg total) by mouth 2 (two) times daily.   ezetimibe 10 MG tablet Commonly known as: ZETIA Take 1 tablet by mouth once daily   HYDROcodone-acetaminophen 5-325 MG tablet Commonly known as: NORCO/VICODIN Take 1 tablet by  mouth every 6 (six) hours as needed for moderate pain or severe pain.   hyoscyamine 0.125 MG SL tablet Commonly known as: LEVSIN SL Place 1 tablet (0.125 mg total) under the tongue every 4 (four) hours as needed (bladder spasms).   levalbuterol 45 MCG/ACT inhaler Commonly known as: XOPENEX HFA Inhale 1 puff into the lungs every 4 (four) hours as needed for wheezing or shortness of breath.   Melatonin 10 MG Tabs Take 10 mg by mouth at bedtime.   nitroGLYCERIN 0.4 MG SL tablet Commonly known as: Nitrostat Place 1 tablet (0.4 mg total) under the tongue every 5 (five) minutes as needed for chest pain.   pantoprazole 40 MG tablet Commonly known as: PROTONIX Take 1 tablet by mouth once daily   PARoxetine 20 MG tablet Commonly known as: PAXIL Take 20 mg by mouth daily.   polyethylene glycol 17 g packet Commonly known as: MIRALAX / GLYCOLAX Take 17 g by mouth daily.   Repatha SureClick 140 MG/ML Soaj Generic drug: Evolocumab Inject 140 mg into the skin every 14 (fourteen) days.   spironolactone 25 MG tablet Commonly known as: ALDACTONE Take 1 tablet (25 mg total) by mouth daily.       Disposition: Home Diet recommendation: Cardiac diet  Discharge Exam: Vitals:   03/04/23 1610 03/04/23 0556 03/04/23 0842 03/04/23 1231  BP: 123/68  Pulse: 69  67 81  Resp: 18  18 18   Temp: 98.1 F (36.7 C)     TempSrc: Oral     SpO2: 98%  98% 96%  Weight:  84 kg    Height:       General: Appear in mild distress; no visible Abnormal Neck Mass Or lumps, Conjunctiva normal Cardiovascular: S1 and S2 Present, no Murmur, Respiratory: good respiratory effort, Bilateral Air entry present and CTA, no Crackles, no wheezes Abdomen: Bowel Sound present, Non tender  Extremities: no Pedal edema Neurology: alert and oriented to time, place, and person  Filed Weights   03/02/23 0930 03/03/23 0422 03/04/23 0556  Weight: 81.6 kg 83.6 kg 84 kg   Condition at discharge: stable  The results of  significant diagnostics from this hospitalization (including imaging, microbiology, ancillary and laboratory) are listed below for reference.   Imaging Studies: DG Chest 2 View  Result Date: 03/03/2023 CLINICAL DATA:  Pneumonia. EXAM: CHEST - 2 VIEW COMPARISON:  March 02, 2023. FINDINGS: Stable cardiomegaly. Sternotomy wires are noted. Tracheostomy is unchanged. Elevated right hemidiaphragm is noted. Mildly decreased right midlung opacity is noted suggesting improving pneumonia or atelectasis. Stable left midlung and basilar opacities are noted. Bony thorax is unremarkable. IMPRESSION: Improved right midlung opacity as noted above. Stable left lung opacities. Electronically Signed   By: Lupita Raider M.D.   On: 03/03/2023 09:20   DG CHEST PORT 1 VIEW  Result Date: 03/02/2023 CLINICAL DATA:  Shortness of breath.  Tracheostomy EXAM: PORTABLE CHEST 1 VIEW COMPARISON:  X-ray and CT angiogram 02/07/2023 FINDINGS: Poor inflation. Sternal wires. Enlarged cardiopericardial silhouette with some vascular congestion. Tracheostomy tube. Surgical changes overlying the mediastinum. There is some patchy opacity in the right mid to lower lung. Subtle infiltrates possible. No pneumothorax. Overlapping cardiac leads. IMPRESSION: Postop chest with tracheostomy tube. Poor inflation with some developing patchy right midlung opacity. Subtle infiltrates possible. Recommend follow-up. Electronically Signed   By: Karen Kays M.D.   On: 03/02/2023 18:17   DG C-Arm 1-60 Min-No Report  Result Date: 03/02/2023 Fluoroscopy was utilized by the requesting physician.  No radiographic interpretation.   CT Angio Chest PE W and/or Wo Contrast  Result Date: 02/07/2023 CLINICAL DATA:  Dyspnea on exertion EXAM: CT ANGIOGRAPHY CHEST WITH CONTRAST TECHNIQUE: Multidetector CT imaging of the chest was performed using the standard protocol during bolus administration of intravenous contrast. Multiplanar CT image reconstructions and MIPs were  obtained to evaluate the vascular anatomy. RADIATION DOSE REDUCTION: This exam was performed according to the departmental dose-optimization program which includes automated exposure control, adjustment of the mA and/or kV according to patient size and/or use of iterative reconstruction technique. CONTRAST:  60mL OMNIPAQUE IOHEXOL 350 MG/ML SOLN COMPARISON:  Chest x-ray 02/07/2023, CT 12/11/2018 FINDINGS: Cardiovascular: Satisfactory opacification of the pulmonary arteries to the segmental level. No evidence of pulmonary embolism. Moderate aortic atherosclerosis. No aneurysm. Coronary vascular calcification. Mild cardiomegaly. No pericardial effusion. Mediastinum/Nodes: Midline trachea. No thyroid mass. Tracheostomy tube with tip several cm above the carina. No suspicious lymph nodes. Mildly prominent right subcarinal lymph node measuring 12 mm. Esophagus within normal limits Lungs/Pleura: Mild emphysema. Mild subpleural reticulation. Minimal bronchiectasis and scarring at the right middle lobe and anterior right upper lobe. Mild scarring at the lingula. Upper Abdomen: No acute finding. Partially visualized right kidney appears slightly atrophic Musculoskeletal: Post sternotomy changes. No acute osseous abnormality. Gynecomastia. Review of the MIP images confirms the above findings. IMPRESSION: 1. Negative for acute pulmonary embolus. 2. Mild  emphysema.  Mild areas of pulmonary scarring. Aortic Atherosclerosis (ICD10-I70.0) and Emphysema (ICD10-J43.9). Electronically Signed   By: Jasmine Pang M.D.   On: 02/07/2023 20:21   DG Chest Port 1 View  Result Date: 02/07/2023 CLINICAL DATA:  Dyspnea on exertion. Recent diagnosis of bladder cancer. Hematuria. EXAM: PORTABLE CHEST 1 VIEW COMPARISON:  08/07/2022 FINDINGS: Tracheostomy tube is present. Postoperative changes in the mediastinum. Shallow inspiration. Probable atelectasis in the lung bases. Cardiac enlargement. No developing airspace disease or consolidation in  the lungs. No pleural effusions. No pneumothorax. Mediastinal contours appear intact. IMPRESSION: Shallow inspiration with basilar atelectasis. No evidence of active pulmonary disease. Electronically Signed   By: Burman Nieves M.D.   On: 02/07/2023 17:14    Microbiology: Results for orders placed or performed during the hospital encounter of 02/07/23  Resp panel by RT-PCR (RSV, Flu A&B, Covid) Anterior Nasal Swab     Status: None   Collection Time: 02/07/23  4:07 PM   Specimen: Anterior Nasal Swab  Result Value Ref Range Status   SARS Coronavirus 2 by RT PCR NEGATIVE NEGATIVE Final   Influenza A by PCR NEGATIVE NEGATIVE Final   Influenza B by PCR NEGATIVE NEGATIVE Final    Comment: (NOTE) The Xpert Xpress SARS-CoV-2/FLU/RSV plus assay is intended as an aid in the diagnosis of influenza from Nasopharyngeal swab specimens and should not be used as a sole basis for treatment. Nasal washings and aspirates are unacceptable for Xpert Xpress SARS-CoV-2/FLU/RSV testing.  Fact Sheet for Patients: BloggerCourse.com  Fact Sheet for Healthcare Providers: SeriousBroker.it  This test is not yet approved or cleared by the [place] FDA and has been authorized for detection and/or diagnosis of SARS-CoV-2 by FDA under an Emergency Use Authorization (EUA). This EUA will remain in effect (meaning this test can be used) for the duration of the COVID-19 declaration under Section 564(b)(1) of the Act, 21 U.S.C. section 360bbb-3(b)(1), unless the authorization is terminated or revoked.     Resp Syncytial Virus by PCR NEGATIVE NEGATIVE Final    Comment: (NOTE) Fact Sheet for Patients: BloggerCourse.com  Fact Sheet for Healthcare Providers: SeriousBroker.it  This test is not yet approved or cleared by the [place] FDA and has been authorized for detection and/or diagnosis of SARS-CoV-2  by FDA under an Emergency Use Authorization (EUA). This EUA will remain in effect (meaning this test can be used) for the duration of the COVID-19 declaration under Section 564(b)(1) of the Act, 21 U.S.C. section 360bbb-3(b)(1), unless the authorization is terminated or revoked.  Performed at [facility], [address] [place], [place] [address]    Labs: CBC: Recent Labs  Lab 03/02/23 2120 03/03/23 0437 03/04/23 0418  WBC 12.2* 14.5* 13.9*  NEUTROABS 11.4*  --   --   HGB 11.9* 11.6* 10.8*  HCT 38.2* 36.8* 34.5*  MCV 89.9 90.2 89.6  PLT 282 272 249   Basic Metabolic Panel: Recent Labs  Lab 03/02/23 2120 03/03/23 0437 03/04/23 0418  NA 134* 135 136  K 4.8 4.5 4.1  CL 102 101 99  CO2 23 21* 24  GLUCOSE 167* 149* 151*  BUN 26* 28* 38*  CREATININE 1.82* 1.74* 1.93*  CALCIUM 8.6* 8.8* 8.4*  MG 2.3  --  2.3   Liver Function Tests: Recent Labs  Lab 03/02/23 2120  AST 19  ALT 17  ALKPHOS 93  BILITOT 0.5  PROT 6.9  ALBUMIN 3.5   CBG: No results for input(s): "GLUCAP" in the last 168 hours.  Discharge time spent: greater than 30 minutes.  Author: Lynden Oxford, MD  [facility] 03/04/2023

## 2023-03-08 ENCOUNTER — Encounter: Payer: Medicare HMO | Admitting: Urology

## 2023-03-09 ENCOUNTER — Ambulatory Visit (INDEPENDENT_AMBULATORY_CARE_PROVIDER_SITE_OTHER): Payer: Medicare HMO | Admitting: Pulmonary Disease

## 2023-03-09 ENCOUNTER — Encounter: Payer: Self-pay | Admitting: Pulmonary Disease

## 2023-03-09 VITALS — BP 126/74 | HR 82 | Ht 66.0 in | Wt 179.0 lb

## 2023-03-09 DIAGNOSIS — J432 Centrilobular emphysema: Secondary | ICD-10-CM | POA: Diagnosis not present

## 2023-03-09 DIAGNOSIS — Z930 Tracheostomy status: Secondary | ICD-10-CM

## 2023-03-09 DIAGNOSIS — J479 Bronchiectasis, uncomplicated: Secondary | ICD-10-CM | POA: Diagnosis not present

## 2023-03-09 DIAGNOSIS — J849 Interstitial pulmonary disease, unspecified: Secondary | ICD-10-CM | POA: Diagnosis not present

## 2023-03-09 DIAGNOSIS — J411 Mucopurulent chronic bronchitis: Secondary | ICD-10-CM

## 2023-03-09 MED ORDER — LEVALBUTEROL HCL 0.63 MG/3ML IN NEBU: 0.6300 mg | INHALATION_SOLUTION | RESPIRATORY_TRACT | 12 refills | Status: DC | PRN

## 2023-03-09 MED ORDER — BUDESONIDE 0.5 MG/2ML IN SUSP: 0.5000 mg | Freq: Two times a day (BID) | RESPIRATORY_TRACT | 12 refills | Status: DC

## 2023-03-09 MED ORDER — IPRATROPIUM BROMIDE 0.02 % IN SOLN: 0.5000 mg | Freq: Four times a day (QID) | RESPIRATORY_TRACT | 12 refills | Status: DC

## 2023-03-09 MED ORDER — GUAIFENESIN ER 600 MG PO TB12: 1200.0000 mg | ORAL_TABLET | Freq: Two times a day (BID) | ORAL | Status: DC | PRN

## 2023-03-09 NOTE — Patient Instructions (Signed)
Pulmicort one vial nebulized in the morning and in the evening, and rinse your mouth after each use.  Xopenex and Ipratropium nebulized every 6 hours as needed for cough, wheeze, chest congestion, or shortness of breath.  Mucinex twice per day as needed to help with cough and loosen phlegm.  Follow up with Dr. Sherene Sires in 2 months.

## 2023-03-09 NOTE — Progress Notes (Signed)
[facility]  Chief Complaint  Patient presents with   Hospitalization Follow-up    Increased SOB over the past wk. Occ has some wheezing.     Past Surgical History:  He  has a past surgical history that includes Partial laryngectomy (1998); Colonoscopy (11/10/2011); Carotid stent (Right, 06-13-12; 05/04/2018); carotid stent insertion (N/A, 06/13/2012); LEFT HEART CATH AND CORS/GRAFTS ANGIOGRAPHY (N/A, 10/07/2016); Eye surgery; Corneal transplant (Bilateral); Cataract extraction, bilateral (Bilateral); Laparoscopic cholecystectomy (2007); Tracheostomy (03/13/2018); Coronary artery bypass graft (~ 2003); Insertion of retrograde carotid stent (Right, 05/04/2018); LEFT HEART CATH AND CORS/GRAFTS ANGIOGRAPHY (N/A, 06/05/2020); CORONARY STENT INTERVENTION (N/A, 06/05/2020); LEFT HEART CATH AND CORS/GRAFTS ANGIOGRAPHY (N/A, 11/26/2020); CORONARY BALLOON ANGIOPLASTY (N/A, 11/26/2020); Transcarotid artery revascularization (Left, 08/17/2022); and Transurethral resection of bladder tumor (N/A, 03/02/2023).  Past Medical History:  Anxiety, Arthritis, CAD, Back pain, CKD 3, CVA, Depression, GERD, HLD, Nephrolithiasis, HTN, Laryngeal cancer, PAF, PNA, PAD, RBBB  Constitutional:  BP 126/74 (BP Location: Left Arm, Cuff Size: Normal)   Pulse 82   Ht 5\' 6"  (1.676 m)   Wt 179 lb (81.2 kg)   SpO2 96% Comment: on RA  BMI 28.89 kg/m   Brief Summary:  Kenneth [name] is a 82 y.o. male former smoker with laryngeal cancer, tracheal stenosis, tracheostomy status, chronic bronchitis, pulmonary emphysema, bronchiectasis, and interstitial lung disease.      Subjective:   He is followed by Dr. Sherene Sires.  He was in hospital recently for bladder cancer and had TURBT.  He difficulty with oxygenation and deconditioning.  His son was concerned about his status after hospital discharged on 03/04/23 and asked to have earlier pulmonary assessment.  Chest xray from 03/02/23 showed patchy  opacity in Rt mid-lung field.  This was better on chest xray from 03/03/23.    His wife is concerned because he is weak and gets winded easily.  His sleep pattern is not regular, and he isn't eating much.  He has to use the bathroom frequently to urinate.    He has cough with clear sputum.  He doesn't use his speech valve because it gets filled with phlegm quickly.  Not having fever, chest pain, wheeze, or hemoptysis.  His wife says he has a CT chest scheduled for September because he has a "polyp".  His CT chest from July 2024 didn't show anything that would warrant short-term follow up imaging.  Maintained SpO2 > 92% on room air while walking in office today.  Physical Exam:   Appearance - well kempt   ENMT - no sinus tenderness, no oral exudate, no LAN, Mallampati 3 airway, no stridor, tracheostomy site clean  Respiratory - bilateral rhonchi that partially clears with cough  CV - s1s2 regular rate and rhythm, no murmurs  Ext - no clubbing, no edema  Skin - no rashes  Psych - flat affect   Pulmonary testing:  PFT 09/24/15 >> FEV1 2.88 (82%), FEV1% 81, TLC 4.20 (67%), DLCO 58%  Chest Imaging:  CT angio chest 02/07/23 >> mild emphysema, mild subpleural reticulation, minimal BTX RML and RUL, lingular scarring  Cardiac Tests:  Echo 08/09/22 >> EF 55 to 60%  Social History:  He  reports that he quit smoking about 52 years ago. His smoking use included cigarettes. He started smoking about 69 years ago. He has a 17.3 pack-year smoking history. He has never been exposed to tobacco smoke. His smokeless tobacco use includes chew. He reports that he does not drink alcohol and does not  use drugs.  Family History:  His family history includes Cancer in his brother and brother; Dementia in his mother; Heart disease in his brother and father; Hyperlipidemia in his brother.     Assessment/Plan:   Chronic bronchitis, pulmonary emphysema, regional bronchiectasis, and mild interstitial lung  disease. - discussed importance of maintaining regular bronchial hygiene regimen - pulmicort 0.5 mg one vial nebulized bid - ipratropium, xopenex nebulized q6h prn - prn mucinex - advised him to d/w his PCP about why follow up CT chest was scheduled for September 2024  Laryngeal cancer, tracheal stenosis, tracheostomy status. - followed by ENT in [place]  Bladder cancer s/p TURBT. - followed by Dr. Retta Diones with urology  Coronary artery disease, Paroxysmal atrial fibrillation. - followed by Dr. Patrick Jupiter with cardiology  Time Spent Involved in Patient Care on Day of Examination:  47 minutes  Follow up:   Patient Instructions  Pulmicort one vial nebulized in the morning and in the evening, and rinse your mouth after each use.  Xopenex and Ipratropium nebulized every 6 hours as needed for cough, wheeze, chest congestion, or shortness of breath.  Mucinex twice per day as needed to help with cough and loosen phlegm.  Follow up with Dr. Sherene Sires in 2 months.  Medication List:   Allergies as of 03/09/2023       Reactions   Nifedical Xl [nifedipine] Other (See Comments)   Unknown reaction   Ativan [lorazepam] Other (See Comments)   Altered mental state   Penicillins Hives   Has patient had a PCN reaction causing immediate rash, facial/tongue/throat swelling, SOB or lightheadedness with hypotension: Yes Has patient had a PCN reaction causing severe rash involving mucus membranes or skin necrosis: No Has patient had a PCN reaction that required hospitalization No Has patient had a PCN reaction occurring within the last 10 years: No If all of the above answers are "NO", then may proceed with Cephalosporin use. HAS TOLERATED: cephalexin   Sulfacetamide Other (See Comments)   Unknown reaction   Sulfonamide Derivatives Other (See Comments)   Unknown reaction   Crestor [rosuvastatin] Itching, Swelling, Rash   Erythromycin Rash   Firvanq [vancomycin] Itching   Statins  Itching, Swelling, Rash   Rash on his trunk and arms, swelling in his lips        Medication List        Accurate as of March 09, 2023  9:12 AM. If you have any questions, ask your nurse or doctor.          acetaminophen 500 MG tablet Commonly known as: TYLENOL Take 500 mg by mouth every 6 (six) hours as needed for headache, fever, moderate pain or mild pain.   amLODipine 10 MG tablet Commonly known as: NORVASC Take 1 tablet (10 mg total) by mouth daily.   apixaban 2.5 MG Tabs tablet Commonly known as: ELIQUIS Take 1 tablet (2.5 mg total) by mouth 2 (two) times daily.   budesonide 0.5 MG/2ML nebulizer solution Commonly known as: Pulmicort Take 2 mLs (0.5 mg total) by nebulization 2 (two) times daily. Started by: Coralyn Helling   carvedilol 12.5 MG tablet Commonly known as: COREG Take 1.5 tablets (18.75 mg total) by mouth 2 (two) times daily.   clopidogrel 75 MG tablet Commonly known as: PLAVIX Take 1 tablet (75 mg total) by mouth daily.   docusate sodium 100 MG capsule Commonly known as: Colace Take 1 capsule (100 mg total) by mouth 2 (two) times daily.   ezetimibe 10  MG tablet Commonly known as: ZETIA Take 1 tablet by mouth once daily   guaiFENesin 600 MG 12 hr tablet Commonly known as: Mucinex Take 2 tablets (1,200 mg total) by mouth 2 (two) times daily as needed. Started by: Coralyn Helling   HYDROcodone-acetaminophen 5-325 MG tablet Commonly known as: NORCO/VICODIN Take 1 tablet by mouth every 6 (six) hours as needed for moderate pain or severe pain.   hyoscyamine 0.125 MG SL tablet Commonly known as: LEVSIN SL Place 1 tablet (0.125 mg total) under the tongue every 4 (four) hours as needed (bladder spasms).   ipratropium 0.02 % nebulizer solution Commonly known as: ATROVENT Take 2.5 mLs (0.5 mg total) by nebulization 4 (four) times daily. Started by: Coralyn Helling   levalbuterol 0.63 MG/3ML nebulizer solution Commonly known as: Xopenex Take 3 mLs (0.63  mg total) by nebulization every 4 (four) hours as needed for wheezing or shortness of breath. Started by: Coralyn Helling   levalbuterol 45 MCG/ACT inhaler Commonly known as: XOPENEX HFA Inhale 1 puff into the lungs every 4 (four) hours as needed for wheezing or shortness of breath.   Melatonin 10 MG Tabs Take 10 mg by mouth at bedtime.   nitroGLYCERIN 0.4 MG SL tablet Commonly known as: Nitrostat Place 1 tablet (0.4 mg total) under the tongue every 5 (five) minutes as needed for chest pain.   pantoprazole 40 MG tablet Commonly known as: PROTONIX Take 1 tablet by mouth once daily   PARoxetine 20 MG tablet Commonly known as: PAXIL Take 20 mg by mouth daily.   polyethylene glycol 17 g packet Commonly known as: MIRALAX / GLYCOLAX Take 17 g by mouth daily.   Repatha SureClick 140 MG/ML Soaj Generic drug: Evolocumab Inject 140 mg into the skin every 14 (fourteen) days.   spironolactone 25 MG tablet Commonly known as: ALDACTONE Take 1 tablet (25 mg total) by mouth daily.        Signature:  Coralyn Helling, MD [facility] Pager - [phone] 03/09/2023, 9:12 AM

## 2023-03-14 ENCOUNTER — Telehealth: Payer: Self-pay

## 2023-03-14 NOTE — Telephone Encounter (Signed)
You had an opening at 1015 tomorrow, I scheduled patient there and they are aware of apt.

## 2023-03-14 NOTE — Telephone Encounter (Signed)
Patient's wife came in office today with complaints of patient bleeding after surgery and not feeling well or eating well.  He is having issues with incontinence.  Do you have any recommendations for patient?

## 2023-03-15 ENCOUNTER — Ambulatory Visit: Payer: Medicare HMO | Admitting: Urology

## 2023-03-15 ENCOUNTER — Encounter: Payer: Self-pay | Admitting: Urology

## 2023-03-15 VITALS — BP 145/76 | HR 70 | Temp 98.7°F

## 2023-03-15 DIAGNOSIS — Z8551 Personal history of malignant neoplasm of bladder: Secondary | ICD-10-CM

## 2023-03-15 DIAGNOSIS — R339 Retention of urine, unspecified: Secondary | ICD-10-CM

## 2023-03-15 DIAGNOSIS — N35011 Post-traumatic bulbous urethral stricture: Secondary | ICD-10-CM

## 2023-03-15 DIAGNOSIS — R310 Gross hematuria: Secondary | ICD-10-CM

## 2023-03-15 LAB — URINALYSIS, ROUTINE W REFLEX MICROSCOPIC
Bilirubin, UA: NEGATIVE
Glucose, UA: NEGATIVE
Ketones, UA: NEGATIVE
Nitrite, UA: NEGATIVE
Specific Gravity, UA: 1.025 (ref 1.005–1.030)
Urobilinogen, Ur: 1 mg/dL (ref 0.2–1.0)
pH, UA: 6 (ref 5.0–7.5)

## 2023-03-15 LAB — MICROSCOPIC EXAMINATION
Bacteria, UA: NONE SEEN
RBC, Urine: >30 /hpf — AB (ref 0–2)

## 2023-03-15 NOTE — Progress Notes (Signed)
H&P  Chief Complaint: Bladder cancer  History of Present Illness: Kenneth [name] is here today with wife and son to follow-up with his TURBT/gemcitabine on 8.7.2024.  He did spend a couple of days in the hospital afterwards due to pulmonary issues-low oxygenation in the PACU.  He comes in today for follow-up.  He normally is on both Plavix and Eliquis which he continues to take.  Since he restarted these he has had persistent gross hematuria.  Also having persistent frequency urgency and urgency incontinence which predated his surgery.  He is having some dysuria.  Also having lower extremity edema which also predated his surgery.  He does not limit sodium intake. Past Medical History:  Diagnosis Date   Acute ST elevation myocardial infarction (STEMI) of inferior wall (HCC) 03/01/2020   [facility] [place]   Anxiety    Arthritis    Back (05/04/2018)   Carotid artery disease (HCC)    a. 04/2018 s/p R carotid stenting. b. L TCAR 07/2022.   Chronic lower back pain    "have it at night" (05/04/2018)   CKD (chronic kidney disease), stage III (HCC)    Coronary artery disease    CVA (cerebral vascular accident) (HCC)    Depression    GERD (gastroesophageal reflux disease)    High triglycerides    History of kidney stones    Hyperlipidemia LDL goal <70    Hypertension    Hypertensive crisis 08/08/2022   Laryngeal carcinoma (HCC) 1998   PAF (paroxysmal atrial fibrillation) (HCC)    Peripheral vascular disease (HCC)    Pneumonia 11/11/2021   RBBB    Sinus bradycardia    Stroke [facility])    "he's had several little strokes; many that he wasn't aware of" (05/04/2018)   TIA (transient ischemic attack)    Tobacco abuse    Tracheal stenosis    Tracheostomy in place [facility])     Past Surgical History:  Procedure Laterality Date   CAROTID STENT Right 06-13-12; 05/04/2018   CAROTID STENT INSERTION N/A 06/13/2012   Procedure: CAROTID STENT INSERTION;  Surgeon: Nada Libman, MD;  Location: [facility]  CATH LAB;  Service: Cardiovascular;  Laterality: N/A;   CATARACT EXTRACTION, BILATERAL Bilateral    COLONOSCOPY  11/10/2011   Dr. Jena Gauss: hemorrhoids, tubular adenoma   CORNEAL TRANSPLANT Bilateral    CORONARY ARTERY BYPASS GRAFT  ~ 2003   "CABG X3"   CORONARY BALLOON ANGIOPLASTY N/A 11/26/2020   Procedure: CORONARY BALLOON ANGIOPLASTY;  Surgeon: Lennette Bihari, MD;  [facility]: [facility] INVASIVE CV LAB;  Service: Cardiovascular;  Laterality: N/A;   CORONARY STENT INTERVENTION N/A 06/05/2020   Procedure: CORONARY STENT INTERVENTION;  Surgeon: Runell Gess, MD;  [facility]: [facility] INVASIVE CV LAB;  Service: Cardiovascular;  Laterality: N/A;   EYE SURGERY     INSERTION OF RETROGRADE CAROTID STENT Right 05/04/2018   Procedure: INSERTION OF RIGHT  CAROTID STENT using an ABBOT- XACT carotid stent system;  Surgeon: Nada Libman, MD;  Location: [facility] OR;  Service: Vascular;  Laterality: Right;   LAPAROSCOPIC CHOLECYSTECTOMY  2007   LEFT HEART CATH AND CORS/GRAFTS ANGIOGRAPHY N/A 10/07/2016   Procedure: Left Heart Cath and Cors/Grafts Angiography;  Surgeon: Lennette Bihari, MD;  [facility]: [facility] INVASIVE CV LAB;  Service: Cardiovascular;  Laterality: N/A;   LEFT HEART CATH AND CORS/GRAFTS ANGIOGRAPHY N/A 06/05/2020   Procedure: LEFT HEART CATH AND CORS/GRAFTS ANGIOGRAPHY;  Surgeon: Runell Gess, MD;  [facility] INVASIVE CV LAB;  Service: Cardiovascular;  Laterality: N/A;  LEFT HEART CATH AND CORS/GRAFTS ANGIOGRAPHY N/A 11/26/2020   Procedure: LEFT HEART CATH AND CORS/GRAFTS ANGIOGRAPHY;  Surgeon: Lennette Bihari, MD;  [facility] INVASIVE CV LAB;  Service: Cardiovascular;  Laterality: N/A;   PARTIAL LARYNGECTOMY  1998   TRACHEOSTOMY  03/13/2018   "@ [facility]"   TRANSCAROTID ARTERY REVASCULARIZATION  Left 08/17/2022   Procedure: Transcarotid Artery Revascularization;  Surgeon: Victorino Sparrow, MD;  Location: [facility] OR;  Service: Vascular;  Laterality: Left;   TRANSURETHRAL RESECTION OF BLADDER TUMOR N/A 03/02/2023    Procedure: TRANSURETHRAL RESECTION OF BLADDER TUMOR (TURBT)- with gemcitabine instillation;  Surgeon: [name], MD;  Location: [facility] ORS;  Service: Urology;  Laterality: N/A;    Home Medications:  Allergies as of 03/15/2023       Reactions   Nifedical Xl [nifedipine] Other (See Comments)   Unknown reaction   Ativan [lorazepam] Other (See Comments)   Altered mental state   Penicillins Hives   Has patient had a PCN reaction causing immediate rash, facial/tongue/throat swelling, SOB or lightheadedness with hypotension: Yes Has patient had a PCN reaction causing severe rash involving mucus membranes or skin necrosis: No Has patient had a PCN reaction that required hospitalization No Has patient had a PCN reaction occurring within the last 10 years: No If all of the above answers are "NO", then may proceed with Cephalosporin use. HAS TOLERATED: cephalexin   Sulfacetamide Other (See Comments)   Unknown reaction   Sulfonamide Derivatives Other (See Comments)   Unknown reaction   Crestor [rosuvastatin] Itching, Swelling, Rash   Erythromycin Rash   Firvanq [vancomycin] Itching   Statins Itching, Swelling, Rash   Rash on his trunk and arms, swelling in his lips        Medication List        Accurate as of March 15, 2023 12:17 PM. If you have any questions, ask your nurse or doctor.          acetaminophen 500 MG tablet Commonly known as: TYLENOL Take 500 mg by mouth every 6 (six) hours as needed for headache, fever, moderate pain or mild pain.   amLODipine 10 MG tablet Commonly known as: NORVASC Take 1 tablet (10 mg total) by mouth daily.   apixaban 2.5 MG Tabs tablet Commonly known as: ELIQUIS Take 1 tablet (2.5 mg total) by mouth 2 (two) times daily.   budesonide 0.5 MG/2ML nebulizer solution Commonly known as: Pulmicort Take 2 mLs (0.5 mg total) by nebulization 2 (two) times daily.   carvedilol 12.5 MG tablet Commonly known as: COREG Take 1.5 tablets  (18.75 mg total) by mouth 2 (two) times daily.   clopidogrel 75 MG tablet Commonly known as: PLAVIX Take 1 tablet (75 mg total) by mouth daily.   docusate sodium 100 MG capsule Commonly known as: Colace Take 1 capsule (100 mg total) by mouth 2 (two) times daily.   ezetimibe 10 MG tablet Commonly known as: ZETIA Take 1 tablet by mouth once daily   guaiFENesin 600 MG 12 hr tablet Commonly known as: Mucinex Take 2 tablets (1,200 mg total) by mouth 2 (two) times daily as needed.   HYDROcodone-acetaminophen 5-325 MG tablet Commonly known as: NORCO/VICODIN Take 1 tablet by mouth every 6 (six) hours as needed for moderate pain or severe pain.   hyoscyamine 0.125 MG SL tablet Commonly known as: LEVSIN SL Place 1 tablet (0.125 mg total) under the tongue every 4 (four) hours as needed (bladder spasms).   ipratropium 0.02 % nebulizer solution Commonly known  as: ATROVENT Take 2.5 mLs (0.5 mg total) by nebulization 4 (four) times daily.   levalbuterol 0.63 MG/3ML nebulizer solution Commonly known as: Xopenex Take 3 mLs (0.63 mg total) by nebulization every 4 (four) hours as needed for wheezing or shortness of breath.   levalbuterol 45 MCG/ACT inhaler Commonly known as: XOPENEX HFA Inhale 1 puff into the lungs every 4 (four) hours as needed for wheezing or shortness of breath.   Melatonin 10 MG Tabs Take 10 mg by mouth at bedtime.   nitroGLYCERIN 0.4 MG SL tablet Commonly known as: Nitrostat Place 1 tablet (0.4 mg total) under the tongue every 5 (five) minutes as needed for chest pain.   pantoprazole 40 MG tablet Commonly known as: PROTONIX Take 1 tablet by mouth once daily   PARoxetine 20 MG tablet Commonly known as: PAXIL Take 20 mg by mouth daily.   polyethylene glycol 17 g packet Commonly known as: MIRALAX / GLYCOLAX Take 17 g by mouth daily.   Repatha SureClick 140 MG/ML Soaj Generic drug: Evolocumab Inject 140 mg into the skin every 14 (fourteen) days.    spironolactone 25 MG tablet Commonly known as: ALDACTONE Take 1 tablet (25 mg total) by mouth daily.        Allergies:  Allergies  Allergen Reactions   Nifedical Xl [Nifedipine] Other (See Comments)    Unknown reaction   Ativan [Lorazepam] Other (See Comments)    Altered mental state   Penicillins Hives    Has patient had a PCN reaction causing immediate rash, facial/tongue/throat swelling, SOB or lightheadedness with hypotension: Yes Has patient had a PCN reaction causing severe rash involving mucus membranes or skin necrosis: No Has patient had a PCN reaction that required hospitalization No Has patient had a PCN reaction occurring within the last 10 years: No If all of the above answers are "NO", then may proceed with Cephalosporin use.  HAS TOLERATED: cephalexin   Sulfacetamide Other (See Comments)    Unknown reaction   Sulfonamide Derivatives Other (See Comments)    Unknown reaction   Crestor [Rosuvastatin] Itching, Swelling and Rash   Erythromycin Rash   Firvanq [Vancomycin] Itching   Statins Itching, Swelling and Rash    Rash on his trunk and arms, swelling in his lips    Family History  Problem Relation Age of Onset   Dementia Mother    Heart disease Father    Cancer Brother    Heart disease Brother    Hyperlipidemia Brother    Cancer Brother    Colon cancer Neg Hx     Social History:  reports that he quit smoking about 52 years ago. His smoking use included cigarettes. He started smoking about 69 years ago. He has a 17.3 pack-year smoking history. He has never been exposed to tobacco smoke. His smokeless tobacco use includes chew. He reports that he does not drink alcohol and does not use drugs.  ROS: A complete review of systems was performed.  All systems are negative except for pertinent findings as noted.  Physical Exam:  Vital signs in last 24 hours: BP (!) 145/76   Pulse 70   Temp 98.7 F (37.1 C)  Constitutional:  Alert and oriented, No acute  distress Cardiovascular: Regular rate  Respiratory: Normal respiratory effort GI: Abdomen is soft, nontender, nondistended, no abdominal masses. No CVAT.  Genitourinary: Normal male phallus, testes are descended bilaterally and non-tender and without masses, scrotum is normal in appearance without lesions or masses, perineum is normal on inspection.  Lymphatic: No lymphadenopathy Neurologic: Grossly intact, no focal deficits Psychiatric: Normal mood and affect  I have reviewed prior pt notes  I have reviewed notes from referring/previous physicians-did-Hospital discharge summary reviewed  I have reviewed urinalysis results--hematuria present  I have independently reviewed prior imaging--CT images from June reviewed  I have reviewed pathology results-given to patient.  High-grade nonmuscle invasive bladder cancer, small-volume     Impression/Assessment:  1.  High-grade nonmuscle invasive bladder cancer, very small volume, status post TURBT on the seventh.  2.  Recurrent hematuria, back on Plavix and Eliquis  3.  Overactive bladder symptoms  Plan:  1.  Pathology reviewed at this point.  Seeing his medical status, as well as his bladder function, I question whether he should have repeat TURBT and eventual BCG  2.  I will culture urine, no antibiotics administered  3.  He was given samples of Gemtesa for 1 month.  I told him to temporarily stop Eliquis and Plavix until his urine clears, then restart the day after  4.  I told him to stop the hyoscyamine

## 2023-03-17 LAB — URINE CULTURE

## 2023-03-22 ENCOUNTER — Encounter: Payer: Medicare HMO | Admitting: Urology

## 2023-03-23 ENCOUNTER — Institutional Professional Consult (permissible substitution): Payer: Medicare HMO | Admitting: Internal Medicine

## 2023-03-25 ENCOUNTER — Telehealth: Payer: Self-pay | Admitting: Cardiology

## 2023-03-25 NOTE — Telephone Encounter (Signed)
Patient's son is returning call. 

## 2023-03-25 NOTE — Telephone Encounter (Signed)
Secure chat sent to Dr. Wyline Mood regarding pt. Per Dr. Wyline Mood:   yes 220 on a hospital day next week. needs to discuss ongoing bleeding with his urologist. if no bleeding on plavix alone continue that for now, will discuss at our appt

## 2023-03-25 NOTE — Telephone Encounter (Signed)
Left a message for pt's wife to call office back regarding appointment.

## 2023-03-25 NOTE — Telephone Encounter (Signed)
Spoke to pts son and verbalized plan. Son had no questions or concerns at this time.

## 2023-03-25 NOTE — Telephone Encounter (Signed)
[name] recently had a surgery that removed some cancer on his bladder per wife. He was took off eliquis and, plavix before surgery. Once he resumed the two meds he began to bleed a lot according to his wife. She said the doctor told her to stop the blood thinners until the bleeding stopped and, then start it again once the bleeding stops. She said she did and the bleeding resumed. She is now only giving the plavix only. She said that he is doing good no bleeding. She concerned that he is not safe with only taking the plavix, and that he has some swelling with his legs. She wants him to be seen we have nothing available in [facility] soon. Can he be add to a hospital day? Please advise. Wife would like a phone call.

## 2023-03-28 NOTE — Progress Notes (Signed)
History of Present Illness: Takuma is here today with wife and son to follow-up with his TURBT/gemcitabine on 8.7.2024.  He did spend a couple of days in the hospital afterwards due to pulmonary issues-low oxygenation in the PACU.   9.3.2024: Here for f/u. At last visit he was still having significant hematuria with him having restarted plavix as well as eliquis.  Past Medical History:  Diagnosis Date   Acute ST elevation myocardial infarction (STEMI) of inferior wall (HCC) 03/01/2020   [facility] [place]   Anxiety    Arthritis    Back (05/04/2018)   Carotid artery disease (HCC)    a. 04/2018 s/p R carotid stenting. b. L TCAR 07/2022.   Chronic lower back pain    "have it at night" (05/04/2018)   CKD (chronic kidney disease), stage III (HCC)    Coronary artery disease    CVA (cerebral vascular accident) (HCC)    Depression    GERD (gastroesophageal reflux disease)    High triglycerides    History of kidney stones    Hyperlipidemia LDL goal <70    Hypertension    Hypertensive crisis 08/08/2022   Laryngeal carcinoma (HCC) 1998   PAF (paroxysmal atrial fibrillation) (HCC)    Peripheral vascular disease (HCC)    Pneumonia 11/11/2021   RBBB    Sinus bradycardia    Stroke [facility])    "he's had several little strokes; many that he wasn't aware of" (05/04/2018)   TIA (transient ischemic attack)    Tobacco abuse    Tracheal stenosis    Tracheostomy in place [facility])     Past Surgical History:  Procedure Laterality Date   CAROTID STENT Right 06-13-12; 05/04/2018   CAROTID STENT INSERTION N/A 06/13/2012   Procedure: CAROTID STENT INSERTION;  Surgeon: Nada Libman, MD;  Location: [facility] CATH LAB;  Service: Cardiovascular;  Laterality: N/A;   CATARACT EXTRACTION, BILATERAL Bilateral    COLONOSCOPY  11/10/2011   Dr. Jena Gauss: hemorrhoids, tubular adenoma   CORNEAL TRANSPLANT Bilateral    CORONARY ARTERY BYPASS GRAFT  ~ 2003   "CABG X3"   CORONARY BALLOON ANGIOPLASTY N/A 11/26/2020    Procedure: CORONARY BALLOON ANGIOPLASTY;  Surgeon: Lennette Bihari, MD;  [facility]: [facility] INVASIVE CV LAB;  Service: Cardiovascular;  Laterality: N/A;   CORONARY STENT INTERVENTION N/A 06/05/2020   Procedure: CORONARY STENT INTERVENTION;  Surgeon: Runell Gess, MD;  [facility]: [facility] INVASIVE CV LAB;  Service: Cardiovascular;  Laterality: N/A;   EYE SURGERY     INSERTION OF RETROGRADE CAROTID STENT Right 05/04/2018   Procedure: INSERTION OF RIGHT  CAROTID STENT using an ABBOT- XACT carotid stent system;  Surgeon: Nada Libman, MD;  Location: [facility] OR;  Service: Vascular;  Laterality: Right;   LAPAROSCOPIC CHOLECYSTECTOMY  2007   LEFT HEART CATH AND CORS/GRAFTS ANGIOGRAPHY N/A 10/07/2016   Procedure: Left Heart Cath and Cors/Grafts Angiography;  Surgeon: Lennette Bihari, MD;  Location: [facility] INVASIVE CV LAB;  Service: Cardiovascular;  Laterality: N/A;   LEFT HEART CATH AND CORS/GRAFTS ANGIOGRAPHY N/A 06/05/2020   Procedure: LEFT HEART CATH AND CORS/GRAFTS ANGIOGRAPHY;  Surgeon: Runell Gess, MD;  [facility] INVASIVE CV LAB;  Service: Cardiovascular;  Laterality: N/A;   LEFT HEART CATH AND CORS/GRAFTS ANGIOGRAPHY N/A 11/26/2020   Procedure: LEFT HEART CATH AND CORS/GRAFTS ANGIOGRAPHY;  Surgeon: Lennette Bihari, MD;  [facility]: [facility] INVASIVE CV LAB;  Service: Cardiovascular;  Laterality: N/A;   PARTIAL LARYNGECTOMY  1998   TRACHEOSTOMY  03/13/2018   "@  [facility]"   TRANSCAROTID ARTERY REVASCULARIZATION  Left 08/17/2022   Procedure: Transcarotid Artery Revascularization;  Surgeon: Victorino Sparrow, MD;  Location: [facility] OR;  Service: Vascular;  Laterality: Left;   TRANSURETHRAL RESECTION OF BLADDER TUMOR N/A 03/02/2023   Procedure: TRANSURETHRAL RESECTION OF BLADDER TUMOR (TURBT)- with gemcitabine instillation;  Surgeon: [name], MD;  Location: [facility] ORS;  Service: Urology;  Laterality: N/A;    Home Medications:  Allergies as of 03/29/2023       Reactions   Nifedical Xl [nifedipine] Other (See  Comments)   Unknown reaction   Ativan [lorazepam] Other (See Comments)   Altered mental state   Penicillins Hives   Has patient had a PCN reaction causing immediate rash, facial/tongue/throat swelling, SOB or lightheadedness with hypotension: Yes Has patient had a PCN reaction causing severe rash involving mucus membranes or skin necrosis: No Has patient had a PCN reaction that required hospitalization No Has patient had a PCN reaction occurring within the last 10 years: No If all of the above answers are "NO", then may proceed with Cephalosporin use. HAS TOLERATED: cephalexin   Sulfacetamide Other (See Comments)   Unknown reaction   Sulfonamide Derivatives Other (See Comments)   Unknown reaction   Crestor [rosuvastatin] Itching, Swelling, Rash   Erythromycin Rash   Firvanq [vancomycin] Itching   Statins Itching, Swelling, Rash   Rash on his trunk and arms, swelling in his lips        Medication List        Accurate as of March 28, 2023  4:31 PM. If you have any questions, ask your nurse or doctor.          acetaminophen 500 MG tablet Commonly known as: TYLENOL Take 500 mg by mouth every 6 (six) hours as needed for headache, fever, moderate pain or mild pain.   amLODipine 10 MG tablet Commonly known as: NORVASC Take 1 tablet (10 mg total) by mouth daily.   apixaban 2.5 MG Tabs tablet Commonly known as: ELIQUIS Take 1 tablet (2.5 mg total) by mouth 2 (two) times daily.   budesonide 0.5 MG/2ML nebulizer solution Commonly known as: Pulmicort Take 2 mLs (0.5 mg total) by nebulization 2 (two) times daily.   carvedilol 12.5 MG tablet Commonly known as: COREG Take 1.5 tablets (18.75 mg total) by mouth 2 (two) times daily.   clopidogrel 75 MG tablet Commonly known as: PLAVIX Take 1 tablet (75 mg total) by mouth daily.   docusate sodium 100 MG capsule Commonly known as: Colace Take 1 capsule (100 mg total) by mouth 2 (two) times daily.   ezetimibe 10 MG  tablet Commonly known as: ZETIA Take 1 tablet by mouth once daily   guaiFENesin 600 MG 12 hr tablet Commonly known as: Mucinex Take 2 tablets (1,200 mg total) by mouth 2 (two) times daily as needed.   HYDROcodone-acetaminophen 5-325 MG tablet Commonly known as: NORCO/VICODIN Take 1 tablet by mouth every 6 (six) hours as needed for moderate pain or severe pain.   hyoscyamine 0.125 MG SL tablet Commonly known as: LEVSIN SL Place 1 tablet (0.125 mg total) under the tongue every 4 (four) hours as needed (bladder spasms).   ipratropium 0.02 % nebulizer solution Commonly known as: ATROVENT Take 2.5 mLs (0.5 mg total) by nebulization 4 (four) times daily.   levalbuterol 0.63 MG/3ML nebulizer solution Commonly known as: Xopenex Take 3 mLs (0.63 mg total) by nebulization every 4 (four) hours as needed for wheezing or shortness of breath.   levalbuterol  45 MCG/ACT inhaler Commonly known as: XOPENEX HFA Inhale 1 puff into the lungs every 4 (four) hours as needed for wheezing or shortness of breath.   Melatonin 10 MG Tabs Take 10 mg by mouth at bedtime.   nitroGLYCERIN 0.4 MG SL tablet Commonly known as: Nitrostat Place 1 tablet (0.4 mg total) under the tongue every 5 (five) minutes as needed for chest pain.   pantoprazole 40 MG tablet Commonly known as: PROTONIX Take 1 tablet by mouth once daily   PARoxetine 20 MG tablet Commonly known as: PAXIL Take 20 mg by mouth daily.   polyethylene glycol 17 g packet Commonly known as: MIRALAX / GLYCOLAX Take 17 g by mouth daily.   Repatha SureClick 140 MG/ML Soaj Generic drug: Evolocumab Inject 140 mg into the skin every 14 (fourteen) days.   spironolactone 25 MG tablet Commonly known as: ALDACTONE Take 1 tablet (25 mg total) by mouth daily.        Allergies:  Allergies  Allergen Reactions   Nifedical Xl [Nifedipine] Other (See Comments)    Unknown reaction   Ativan [Lorazepam] Other (See Comments)    Altered mental state    Penicillins Hives    Has patient had a PCN reaction causing immediate rash, facial/tongue/throat swelling, SOB or lightheadedness with hypotension: Yes Has patient had a PCN reaction causing severe rash involving mucus membranes or skin necrosis: No Has patient had a PCN reaction that required hospitalization No Has patient had a PCN reaction occurring within the last 10 years: No If all of the above answers are "NO", then may proceed with Cephalosporin use.  HAS TOLERATED: cephalexin   Sulfacetamide Other (See Comments)    Unknown reaction   Sulfonamide Derivatives Other (See Comments)    Unknown reaction   Crestor [Rosuvastatin] Itching, Swelling and Rash   Erythromycin Rash   Firvanq [Vancomycin] Itching   Statins Itching, Swelling and Rash    Rash on his trunk and arms, swelling in his lips    Family History  Problem Relation Age of Onset   Dementia Mother    Heart disease Father    Cancer Brother    Heart disease Brother    Hyperlipidemia Brother    Cancer Brother    Colon cancer Neg Hx     Social History:  reports that he quit smoking about 52 years ago. His smoking use included cigarettes. He started smoking about 69 years ago. He has a 17.3 pack-year smoking history. He has never been exposed to tobacco smoke. His smokeless tobacco use includes chew. He reports that he does not drink alcohol and does not use drugs.  ROS: A complete review of systems was performed.  All systems are negative except for pertinent findings as noted.  Physical Exam:  Vital signs in last 24 hours: There were no vitals taken for this visit. Constitutional:  Alert and oriented, No acute distress Cardiovascular: Regular rate  Respiratory: Normal respiratory effort Neurologic: Grossly intact, no focal deficits Psychiatric: Normal mood and affect  I have reviewed prior pt notes  I have reviewed urinalysis results  I have independently reviewed prior imaging  I have reviewed prior  pathology results  I have reviewed prior urine culture   Impression/Assessment:  ***  Plan:  ***

## 2023-03-29 ENCOUNTER — Ambulatory Visit (INDEPENDENT_AMBULATORY_CARE_PROVIDER_SITE_OTHER): Payer: Medicare HMO | Admitting: Urology

## 2023-03-29 ENCOUNTER — Encounter: Payer: Self-pay | Admitting: Urology

## 2023-03-29 VITALS — BP 133/73 | HR 66

## 2023-03-29 DIAGNOSIS — Z8551 Personal history of malignant neoplasm of bladder: Secondary | ICD-10-CM | POA: Diagnosis not present

## 2023-03-29 DIAGNOSIS — R310 Gross hematuria: Secondary | ICD-10-CM

## 2023-03-29 DIAGNOSIS — C670 Malignant neoplasm of trigone of bladder: Secondary | ICD-10-CM

## 2023-03-29 DIAGNOSIS — R300 Dysuria: Secondary | ICD-10-CM

## 2023-03-29 LAB — MICROSCOPIC EXAMINATION: RBC, Urine: >30 /HPF — AB (ref 0–2)

## 2023-03-29 LAB — URINALYSIS, ROUTINE W REFLEX MICROSCOPIC
Bilirubin, UA: NEGATIVE
Glucose, UA: NEGATIVE
Ketones, UA: NEGATIVE
Nitrite, UA: NEGATIVE
Specific Gravity, UA: 1.025 (ref 1.005–1.030)
Urobilinogen, Ur: 0.2 mg/dL (ref 0.2–1.0)
pH, UA: 5.5 (ref 5.0–7.5)

## 2023-03-29 LAB — BLADDER SCAN AMB NON-IMAGING: Scan Result: 21

## 2023-03-30 ENCOUNTER — Encounter: Payer: Self-pay | Admitting: Cardiology

## 2023-03-30 ENCOUNTER — Ambulatory Visit: Payer: Medicare HMO | Attending: Cardiology | Admitting: Cardiology

## 2023-03-30 ENCOUNTER — Ambulatory Visit: Payer: Medicare HMO | Admitting: Cardiology

## 2023-03-30 VITALS — BP 122/80 | HR 76 | Ht 66.0 in | Wt 185.0 lb

## 2023-03-30 DIAGNOSIS — R600 Localized edema: Secondary | ICD-10-CM

## 2023-03-30 DIAGNOSIS — R319 Hematuria, unspecified: Secondary | ICD-10-CM

## 2023-03-30 MED ORDER — FUROSEMIDE 20 MG PO TABS: ORAL_TABLET | ORAL | 3 refills | Status: DC

## 2023-03-30 NOTE — Patient Instructions (Signed)
Medication Instructions:   STOP Amlodipine   In 4 days, (04/02/23) re-start Eliquis 2.5 mg twice a day  Take Lasix 20 mg daily as needed for leg swelling  Labwork: One today  Testing/Procedures: None today  Follow-Up: Keep 10/3 appointment with B.Strader,PA-C  Any Other Special Instructions Will Be Listed Below (If Applicable).  If you need a refill on your cardiac medications before your next appointment, please call your pharmacy.

## 2023-03-30 NOTE — Progress Notes (Signed)
Clinical Summary Mr. Hopson is a 82 y.o.male seen today for follow up of the following medical problems.  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 LE edema Jan 2024 echo LVEF 55-60%, indet diastolic function.   - reports increased LE edema starting a few months ago. Chronic SOB unchanged. Of note did restart norvasc in February, unclear if could be related.    Other medical issues not addressed this visit    1. CAD   - prior CABG in 2003, repeat cath 2007 with patent grafts.   - 07/2011 MPI low risk study, no ischemia.   - echo 04/2012 LVEF 65-60%, grade II diastolic dysfunction  - 10/2013 echo showed LVEF 65-70%, grade II diastolic dysfunction.  - 11/2013 MPI no ischemia.  - 10/2017 echo LVEF 60-65%, no WMAs, normal diastolic function     - 12/2017 nuclear stress: no ischemia  - 02/2020 inferior STEMI, received DES to RCA 05/2020 NSTEMI, DES to RCA   11/2020 NSTEMI, peak trop 162 11/2020 with RCA ISR, had PTCA. Recs for indefinite DAPT  (on high dose ASA per neuro given prior CVA, carotid disease) 11/2020 echo: LVEF 65-70%, no WMAs, normal RV,    08/2021 STEMI, 3 DES to the RCA in [place].  - Echocardiogram during recent admission showed his EF was mildly reduced at 45 to 50    Jan 2024 echo LVEF 55-60%      2. Hyperlipidemia   - reported history of rash on statin, he reports tried multiple and all caused rash  - he is on zetia, praluent      3. HTN   - recent high bp's - home bp's  130s/140s/80s     4. Carotid stenosis   - prior carotid stenting   - followed by vascular, recs have been for DAPT   - 11/2020 carotid US: LICA 70-99%, IRCA 1-49% with patent right CCA stent - recent vascualr appt 01/2021, plans for medical therapy due to risk for intervention and patient preference.      - found to have critical ICA stenosis during admit with CVA, underwent left sided TCAR Jan 23,2024   5. SOB - remote 10-15 year history of smoking. Used to work in [employer]. - chronic DOE, mainly with walking up incline.    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   - followed by pulm Notes mention CT imaging consistent with slowly resolving ALI, lung imaging has shown consistent interstitial findings.        6. Throat cancer  s/p radiation and surgery - followed at [facility]   - has tracheostomy   7. CVA - admit 10/2016 with small cortical based infarcts in right parietal lobe, associated hemorrhage - right ICA 50-69% 10/2017   - admit 11/2020 with TIA - was changed to high dose ASA at that time   - admit Jan 2024 with CVA -  found to have critical ICA stenosis, underwent left sided TCAR Jan 23,2024 - plans were for ASA/plavix/eliquis 1 month,     8. 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 04/2018 s/p R carotid stenting. b. L TCAR 07/2022.   Chronic lower back pain    "have it at night" (05/04/2018)   CKD  (chronic kidney disease), stage III (HCC)    Coronary artery disease    CVA (cerebral vascular accident) (HCC)    Depression    GERD (gastroesophageal reflux disease)    High triglycerides    History of kidney stones    Hyperlipidemia LDL goal <70    Hypertension    Hypertensive crisis 08/08/2022   Laryngeal carcinoma (HCC) 1998   PAF (paroxysmal atrial fibrillation) (HCC)    Peripheral vascular disease (HCC)    Pneumonia 11/11/2021   RBBB    Sinus bradycardia    Stroke [facility])    "he's had several little strokes; many that he wasn't aware of" (05/04/2018)   TIA (transient ischemic attack)    Tobacco abuse    Tracheal stenosis    Tracheostomy in place (HCC)      Allergies  Allergen Reactions   Nifedical Xl [Nifedipine] Other (See Comments)    Unknown reaction   Ativan [Lorazepam] Other (See Comments)    Altered mental state   Penicillins Hives    Has patient had a PCN reaction causing immediate rash, facial/tongue/throat swelling, SOB or lightheadedness with hypotension: Yes Has patient had a PCN reaction causing severe rash involving mucus membranes or skin necrosis: No Has patient had a PCN reaction that required hospitalization No Has patient had a PCN reaction occurring within the last 10 years: No If all of the above answers are "NO", then may proceed with Cephalosporin use.  HAS TOLERATED: cephalexin   Sulfacetamide Other (See Comments)    Unknown reaction   Sulfonamide Derivatives Other (See Comments)    Unknown reaction   Crestor [Rosuvastatin] Itching, Swelling and Rash   Erythromycin Rash   Firvanq [Vancomycin] Itching   Statins Itching, Swelling and Rash    Rash on his trunk and arms, swelling in his lips     Current Outpatient Medications  Medication Sig Dispense Refill   acetaminophen (TYLENOL) 500 MG tablet Take 500 mg by mouth every 6 (six) hours as needed for headache, fever, moderate pain or mild pain.     amLODipine (NORVASC) 10 MG tablet Take 1  tablet (10 mg total) by mouth daily. 90 tablet 3   apixaban (ELIQUIS) 2.5 MG TABS tablet Take 1 tablet (2.5 mg total) by mouth 2 (two) times daily. 180 tablet 1   budesonide (PULMICORT) 0.5 MG/2ML nebulizer solution Take 2 mLs (0.5 mg total) by nebulization 2 (two) times daily. 360 mL 12   carvedilol (COREG) 12.5 MG tablet Take 1.5 tablets (18.75 mg total) by mouth 2 (two) times daily. 270 tablet 3   clopidogrel (PLAVIX) 75 MG tablet Take 1 tablet (75 mg total) by mouth daily. 90 tablet 3   docusate  sodium (COLACE) 100 MG capsule Take 1 capsule (100 mg total) by mouth 2 (two) times daily. 10 capsule 0   Evolocumab (REPATHA SURECLICK) 140 MG/ML SOAJ Inject 140 mg into the skin every 14 (fourteen) days. 2 mL 11   ezetimibe (ZETIA) 10 MG tablet Take 1 tablet by mouth once daily 90 tablet 1   guaiFENesin (MUCINEX) 600 MG 12 hr tablet Take 2 tablets (1,200 mg total) by mouth 2 (two) times daily as needed.     HYDROcodone-acetaminophen (NORCO/VICODIN) 5-325 MG tablet Take 1 tablet by mouth every 6 (six) hours as needed for moderate pain or severe pain. 20 tablet 0   hyoscyamine (LEVSIN SL) 0.125 MG SL tablet Place 1 tablet (0.125 mg total) under the tongue every 4 (four) hours as needed (bladder spasms). 30 tablet 0   ipratropium (ATROVENT) 0.02 % nebulizer solution Take 2.5 mLs (0.5 mg total) by nebulization 4 (four) times daily. 360 mL 12   levalbuterol (XOPENEX HFA) 45 MCG/ACT inhaler Inhale 1 puff into the lungs every 4 (four) hours as needed for wheezing or shortness of breath. 1 each 0   levalbuterol (XOPENEX) 0.63 MG/3ML nebulizer solution Take 3 mLs (0.63 mg total) by nebulization every 4 (four) hours as needed for wheezing or shortness of breath. 360 mL 12   Melatonin 10 MG TABS Take 10 mg by mouth at bedtime.     nitroGLYCERIN (NITROSTAT) 0.4 MG SL tablet Place 1 tablet (0.4 mg total) under the tongue every 5 (five) minutes as needed for chest pain. 25 tablet 3   pantoprazole (PROTONIX) 40 MG  tablet Take 1 tablet by mouth once daily 90 tablet 2   PARoxetine (PAXIL) 20 MG tablet Take 20 mg by mouth daily.     polyethylene glycol (MIRALAX / GLYCOLAX) 17 g packet Take 17 g by mouth daily. 14 each 0   spironolactone (ALDACTONE) 25 MG tablet Take 1 tablet (25 mg total) by mouth daily. 90 tablet 3   No current facility-administered medications for this visit.     Past Surgical History:  Procedure Laterality Date   CAROTID STENT Right 06-13-12; 05/04/2018   CAROTID STENT INSERTION N/A 06/13/2012   Procedure: CAROTID STENT INSERTION;  Surgeon: Nada Libman, MD;  Location[facility] CATH LAB;  Service: Cardiovascular;  Laterality: N/A;   CATARACT EXTRACTION, BILATERAL Bilateral    COLONOSCOPY  11/10/2011   Dr. Jena Gauss: hemorrhoids, tubular adenoma   CORNEAL TRANSPLANT Bilateral    CORONARY ARTERY BYPASS GRAFT  ~ 2003   "CABG X3"   CORONARY BALLOON ANGIOPLASTY N/A 11/26/2020   Procedure: CORONARY BALLOON ANGIOPLASTY;  Surgeon: Lennette Bihari, MD;  Location: [facility] INVASIVE CV LAB;  Service: Cardiovascular;  Laterality: N/A;   CORONARY STENT INTERVENTION N/A 06/05/2020   Procedure: CORONARY STENT INTERVENTION;  Surgeon: Runell Gess, MD;  Location: [facility] INVASIVE CV LAB;  Service: Cardiovascular;  Laterality: N/A;   EYE SURGERY     INSERTION OF RETROGRADE CAROTID STENT Right 05/04/2018   Procedure: INSERTION OF RIGHT  CAROTID STENT using an ABBOT- XACT carotid stent system;  Surgeon: Nada Libman, MD;  Location: [facility] OR;  Service: Vascular;  Laterality: Right;   LAPAROSCOPIC CHOLECYSTECTOMY  2007   LEFT HEART CATH AND CORS/GRAFTS ANGIOGRAPHY N/A 10/07/2016   Procedure: Left Heart Cath and Cors/Grafts Angiography;  Surgeon: Lennette Bihari, MD;  Location: [facility] INVASIVE CV LAB;  Service: Cardiovascular;  Laterality: N/A;   LEFT HEART CATH AND CORS/GRAFTS ANGIOGRAPHY N/A 06/05/2020   Procedure: LEFT HEART CATH  AND CORS/GRAFTS ANGIOGRAPHY;  Surgeon: Runell Gess, MD;  Location: [facility] INVASIVE CV  LAB;  Service: Cardiovascular;  Laterality: N/A;   LEFT HEART CATH AND CORS/GRAFTS ANGIOGRAPHY N/A 11/26/2020   Procedure: LEFT HEART CATH AND CORS/GRAFTS ANGIOGRAPHY;  Surgeon: Lennette Bihari, MD;  Location: [facility] INVASIVE CV LAB;  Service: Cardiovascular;  Laterality: N/A;   PARTIAL LARYNGECTOMY  1998   TRACHEOSTOMY  03/13/2018   "@ [facility]"   TRANSCAROTID ARTERY REVASCULARIZATION  Left 08/17/2022   Procedure: Transcarotid Artery Revascularization;  Surgeon: Victorino Sparrow, MD;  Location: [facility] OR;  Service: Vascular;  Laterality: Left;   TRANSURETHRAL RESECTION OF BLADDER TUMOR N/A 03/02/2023   Procedure: TRANSURETHRAL RESECTION OF BLADDER TUMOR (TURBT)- with gemcitabine instillation;  Surgeon: [name], MD;  Location: [facility] ORS;  Service: Urology;  Laterality: N/A;     Allergies  Allergen Reactions   Nifedical Xl [Nifedipine] Other (See Comments)    Unknown reaction   Ativan [Lorazepam] Other (See Comments)    Altered mental state   Penicillins Hives    Has patient had a PCN reaction causing immediate rash, facial/tongue/throat swelling, SOB or lightheadedness with hypotension: Yes Has patient had a PCN reaction causing severe rash involving mucus membranes or skin necrosis: No Has patient had a PCN reaction that required hospitalization No Has patient had a PCN reaction occurring within the last 10 years: No If all of the above answers are "NO", then may proceed with Cephalosporin use.  HAS TOLERATED: cephalexin   Sulfacetamide Other (See Comments)    Unknown reaction   Sulfonamide Derivatives Other (See Comments)    Unknown reaction   Crestor [Rosuvastatin] Itching, Swelling and Rash   Erythromycin Rash   Firvanq [Vancomycin] Itching   Statins Itching, Swelling and Rash    Rash on his trunk and arms, swelling in his lips      Family History  Problem Relation Age of Onset   Dementia Mother    Heart disease Father    Cancer Brother    Heart disease Brother     Hyperlipidemia Brother    Cancer Brother    Colon cancer Neg Hx      Social History Mr. Nikirk reports that he quit smoking about 52 years ago. His smoking use included cigarettes. He started smoking about 69 years ago. He has a 17.3 pack-year smoking history. He has never been exposed to tobacco smoke. His smokeless tobacco use includes chew. Mr. Matlick reports no history of alcohol use.   Review of Systems CONSTITUTIONAL: No weight loss, fever, chills, weakness or fatigue.  HEENT: Eyes: No visual loss, blurred vision, double vision or yellow sclerae.No hearing loss, sneezing, congestion, runny nose or sore throat.  SKIN: No rash or itching.  CARDIOVASCULAR: per hpi RESPIRATORY: No shortness of breath, cough or sputum.  GASTROINTESTINAL: No anorexia, nausea, vomiting or diarrhea. No abdominal pain or blood.  GENITOURINARY: No burning on urination, no polyuria NEUROLOGICAL: No headache, dizziness, syncope, paralysis, ataxia, numbness or tingling in the extremities. No change in bowel or bladder control.  MUSCULOSKELETAL: No muscle, back pain, joint pain or stiffness.  LYMPHATICS: No enlarged nodes. No history of splenectomy.  PSYCHIATRIC: No history of depression or anxiety.  ENDOCRINOLOGIC: No reports of sweating, cold or heat intolerance. No polyuria or polydipsia.  Marland Kitchen   Physical Examination Today's Vitals   03/30/23 1339  BP: 122/80  Pulse: 76  SpO2: 95%  Weight: 185 lb (83.9 kg)  Height: 5\' 6"  (1.676 m)   Body mass index  is 29.86 kg/m.  Gen: resting comfortably, no acute distress HEENT: no scleral icterus, pupils equal round and reactive, no palptable cervical adenopathy,  CV: RRR, no mrg, no jvd Resp: Clear to auscultation bilaterally GI: abdomen is soft, non-tender, non-distended, normal bowel sounds, no hepatosplenomegaly MSK: extremities are warm, no edema.  Skin: warm, no rash Neuro:  no focal deficits Psych: appropriate affect   Diagnostic  Studies 07/2011 MPI   Tomographic views were obtained using the short axis, vertical long axis, and horizontal long axis planes. No significant, reversible perfusion defects are noted to indicate ischemia.  Gated imaging reveals an EDV of 59, ESV of 18, T I D ratio of 0.80, and LVEF of 69%.  IMPRESSION: Low risk exercise/Lexiscan Myoview as outlined. No diagnostic ST- segment changes or arrhythmias were noted. There is evidence of soft tissue attenuation, however no frank scar or ischemia. LVEF is normal at 69%.   04/2012 Echo   LVEF 65-70%, grade II diastolic dysfunction,     11/05/13 Echo Study Conclusions  - Left ventricle: The cavity size was normal. Wall thickness was increased in a pattern of mild LVH. Systolic function was vigorous. The estimated ejection fraction was in the range of 65% to 70%. Wall motion was normal; there were no regional wall motion abnormalities. Features are consistent with a pseudonormal left ventricular filling pattern, with concomitant abnormal relaxation and increased filling pressure (grade 2 diastolic dysfunction). - Aortic valve: Mildly calcified annulus. Trileaflet. Trivial regurgitation. - Mitral valve: Calcified annulus. Trivial regurgitation. - Left atrium: The atrium was mildly dilated. - Right ventricle: Systolic function was low normal. - Right atrium: Central venous pressure: 3mm Hg (est). - Atrial septum: No defect or patent foramen ovale was identified. - Tricuspid valve: Trivial regurgitation. - Pulmonary arteries: Systolic pressure could not be accurately estimated. - Pericardium, extracardiac: There was no pericardial effusion. Impressions:  - Normal LV chamber size with mild LVH and LVEF 65-70%, grade 2 diastolic dysfunction. MIld left atrial enlargement. Mild MAC. Low normal RV contraction. Unable to assess PASP - trivial tricuspid regurgitation.   11/2013 Lexiscan MPI IMPRESSION: 1.  Negative Lexiscan MPI for  ischemia   2.  Normal left ventricular systolic function, LVEF 69%   3.  Low risk study for major cardiac events.     09/2016 cath LM lesion, 50 %stenosed. Prox LAD to Mid LAD lesion, 70 %stenosed. Ost 1st Mrg lesion, 100 %stenosed. RIMA and is normal in caliber and anatomically normal. RPDA-2 lesion, 50 %stenosed. RPDA-1 lesion, 60 %stenosed. LIMA. The left ventricular systolic function is normal. LV end diastolic pressure is normal.   Normal LV function without focal segmental wall motion abnormalities.   Multivessel  native CAD with 50% ostial stenosis of the LAD and diffuse 70% mid stenoses, occlusion of the OM1 vessel of the circumflex, and diffuse 60 and 50% PDA stenoses in the small caliber PDA vessel.   Patent sequential LIMA graft supplying a twin like diagonal and LAD system system.   Patent RIMA graft supplying the circumflex marginal vessel.   RECOMMENDATION: Medical therapy.     10/2017 echo Study Conclusions   - Left ventricle: The cavity size was normal. Wall thickness was   normal. Systolic function was normal. The estimated ejection   fraction was in the range of 60% to 65%. Wall motion was normal;   there were no regional wall motion abnormalities. Left   ventricular diastolic function parameters were normal. - Aortic valve: Mildly calcified annulus. Trileaflet; mildly   thickened  leaflets. Valve area (VTI): 3.01 cm^2. Valve area   (Vmax): 2.24 cm^2. Valve area (Vmean): 2.3 cm^2. - Mitral valve: Mildly calcified annulus. Mildly thickened leaflets   . - Left atrium: The atrium was mildly dilated.   12/2017 nuclear stress No diagnostic ST segment changes to indicate ischemia. No significant myocardial perfusion defects to indicate scar or ischemia. This is a low risk study. Nuclear stress EF: 67%.     11/2020 cath Previously placed Prox RCA stent (unknown type) is widely patent. Ost RCA lesion is 30% stenosed. Mid RCA lesion is 95% stenosed. Ost LM  to Mid LM lesion is 80% stenosed. Ost Cx to Prox Cx lesion is 95% stenosed. Post intervention, there is a 0% residual stenosis. Ost LAD to Prox LAD lesion is 80% stenosed. Prox LAD lesion is 90% stenosed. 2nd Diag lesion is 80% stenosed. RV Arrie Borrelli lesion is 80% stenosed.   Severe native CAD with previously noted high-grade stenoses in the left main, proximal and mid LAD,and proximal circumflex.    Mild 30% proximal narrowing in the RCA prior to the proximal patent  stent but evidence for 95 to 99% in-stent restenosis in the mid RCA stent.   Patent sequential LIMA graft supplying the diagonal 1 and mid LAD.   Patent RIMA graft supplying the OM vessel.   Successful PCI to the mid RCA stent utilizing Wolverine cutting balloon 2.0 x 10 mm, 3.0 x 15 mm and ultimate 3.25 x 15 mm noncompliant balloon dilatations with the 95% stenosis being reduced to 0%.   LVEDP 21 mm Hg.   POST CATH RECOMMENDATION: Continue DAPT indefinitely.  Medical therapy for concomitant CAD.  Continue aggressive lipid-lowering therapy with target LDL less than 70 and optimal blood pressure control.   11/2020 carotid US IMPRESSION: 1. Severe (70-99%) stenosis proximal left internal carotid artery secondary to bulky, heterogeneous and calcified atherosclerotic plaque. 2. Mild (1-49%) stenosis proximal right internal carotid artery secondary to heterogenous atherosclerotic plaque. 3. Patent right common carotid artery stent. 4. Vertebral arteries are patent with normal antegrade flow.   11/2020 echo IMPRESSIONS     1. Left ventricular ejection fraction, by estimation, is 65 to 70%. The  left ventricle has normal function. The left ventricle has no regional  wall motion abnormalities. Left ventricular diastolic parameters were  normal.   2. Right ventricular systolic function is normal. The right ventricular  size is normal. There is normal pulmonary artery systolic pressure. The  estimated right ventricular  systolic pressure is 21.7 mmHg.   3. Left atrial size was mildly dilated.   4. The mitral valve is grossly normal. Trivial mitral valve  regurgitation.   5. The aortic valve is tricuspid. There is moderate calcification of the  aortic valve. Aortic valve regurgitation is not visualized. Mild to  moderate aortic valve sclerosis/calcification is present, without any  evidence of aortic stenosis. Aortic valve  mean gradient measures 7.0 mmHg.   6. The inferior vena cava is normal in size with greater than 50%  respiratory variability, suggesting right atrial pressure of 3 mmHg.     Assessment and Plan   1.Hematuria - s/p bladder tumor removal - troubles restarting his eliquis and plavix due to recurrent hematuria after surgery. Off both meds hematuria resolves - will try restarting eliquis alone in 4 days, that will be a week off. Would stay off plavix for now, I don't think we are going to be able to restart and will manage with eliquis alone - if ongoing bleeding  intolerant to anticoagulation may need to consider watchman referral.    2. Leg edema - may be related to norvasc which was restarted within the last few months - d/c norvasc, start lasix 20mg  prn. If ongonig may consider repeat echo at f/u. Last echo Jan 2024 showed LVEF had normalized, indet distolic function     Antoine Poche, M.D.

## 2023-03-31 LAB — URINE CULTURE: Organism ID, Bacteria: NO GROWTH

## 2023-04-01 ENCOUNTER — Ambulatory Visit (HOSPITAL_COMMUNITY): Admit: 2023-04-01 | Payer: Medicare HMO

## 2023-04-05 ENCOUNTER — Telehealth: Payer: Self-pay

## 2023-04-05 NOTE — Telephone Encounter (Signed)
-----   Message from [name] Dahlstedt sent at 04/01/2023  4:31 PM EDT ----- Let pt knpw culture was neg ----- Message ----- From: Interface, [facility] Lab Results In Sent: 03/29/2023   3:36 PM EDT To: [name], MD

## 2023-04-05 NOTE — Telephone Encounter (Signed)
Patient's son is made aware and voiced understanding. Patient's son states patient is currently in Intensive care unit with congestive heart failure and pneumonia.

## 2023-04-11 DIAGNOSIS — N281 Cyst of kidney, acquired: Secondary | ICD-10-CM | POA: Insufficient documentation

## 2023-04-11 NOTE — Progress Notes (Deleted)
Name: Kenneth [name] DOB: 05-Aug-1940 MRN: [id number]  History of Present Illness: [name] is a 82 y.o. male who presents today for follow up visit at [facility]. - GU history: 1. 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  - 03/02/2023: Underwent TURBT/gemcitabine by Dr. Retta Diones. 2. Dysuria, persistent.  3. Intermittent hematuria. - Exacerbated by anticoagulant use (Plavix and Eliquis). 4. Small volume bladder. 5. Kidney stones. No stones seen on CT 01/03/2023. 6. Left kidney cyst. Per CT on 01/03/2023.   At last visit with Dr. Retta Diones on 03/29/2023: - Urine culture was negative. - The plan was recheck in a couple of weeks and follow up with Dr. Wyline Mood to review use of blood thinners.  Since last visit: ***  Today: He reports ***  He reports *** urinary stream. He {Actions; denies-reports:120008} urinary hesitancy, urgency, frequency, dysuria, gross hematuria, straining to void, or sensations of incomplete emptying.  He {Actions; denies-reports:120008} flank pain. He {Actions; denies-reports:120008} abdominal pain.   Fall Screening: Do you usually have a device to assist in your mobility? {yes/no:20286} ***cane / ***walker / ***wheelchair   Medications: Current Outpatient Medications  Medication Sig Dispense Refill   acetaminophen (TYLENOL) 500 MG tablet Take 500 mg by mouth every 6 (six) hours as needed for headache, fever, moderate pain or mild pain.     apixaban (ELIQUIS) 2.5 MG TABS tablet Take 1 tablet (2.5 mg total) by mouth 2 (two) times daily. (Patient not taking: Reported on 03/30/2023) 180 tablet 1   budesonide (PULMICORT) 0.5 MG/2ML nebulizer solution Take 2 mLs (0.5 mg total) by nebulization 2 (two) times daily. 360 mL 12   carvedilol (COREG) 12.5 MG tablet Take 1.5 tablets (18.75 mg total) by mouth 2 (two) times daily. 270 tablet 3   clopidogrel (PLAVIX) 75 MG tablet Take 1 tablet (75 mg total) by mouth daily.  (Patient not taking: Reported on 03/30/2023) 90 tablet 3   docusate sodium (COLACE) 100 MG capsule Take 1 capsule (100 mg total) by mouth 2 (two) times daily. 10 capsule 0   Evolocumab (REPATHA SURECLICK) 140 MG/ML SOAJ Inject 140 mg into the skin every 14 (fourteen) days. 2 mL 11   ezetimibe (ZETIA) 10 MG tablet Take 1 tablet by mouth once daily 90 tablet 1   furosemide (LASIX) 20 MG tablet Take 20 mg daily as needed for swelling 90 tablet 3   guaiFENesin (MUCINEX) 600 MG 12 hr tablet Take 2 tablets (1,200 mg total) by mouth 2 (two) times daily as needed.     HYDROcodone-acetaminophen (NORCO/VICODIN) 5-325 MG tablet Take 1 tablet by mouth every 6 (six) hours as needed for moderate pain or severe pain. 20 tablet 0   hyoscyamine (LEVSIN SL) 0.125 MG SL tablet Place 1 tablet (0.125 mg total) under the tongue every 4 (four) hours as needed (bladder spasms). 30 tablet 0   ipratropium (ATROVENT) 0.02 % nebulizer solution Take 2.5 mLs (0.5 mg total) by nebulization 4 (four) times daily. 360 mL 12   levalbuterol (XOPENEX HFA) 45 MCG/ACT inhaler Inhale 1 puff into the lungs every 4 (four) hours as needed for wheezing or shortness of breath. 1 each 0   levalbuterol (XOPENEX) 0.63 MG/3ML nebulizer solution Take 3 mLs (0.63 mg total) by nebulization every 4 (four) hours as needed for wheezing or shortness of breath. 360 mL 12   Melatonin 10 MG TABS Take 10 mg by mouth at bedtime.     nitroGLYCERIN (NITROSTAT) 0.4 MG SL tablet Place 1 tablet (0.4  mg total) under the tongue every 5 (five) minutes as needed for chest pain. 25 tablet 3   pantoprazole (PROTONIX) 40 MG tablet Take 1 tablet by mouth once daily 90 tablet 2   PARoxetine (PAXIL) 20 MG tablet Take 20 mg by mouth daily.     polyethylene glycol (MIRALAX / GLYCOLAX) 17 g packet Take 17 g by mouth daily. 14 each 0   spironolactone (ALDACTONE) 25 MG tablet Take 1 tablet (25 mg total) by mouth daily. 90 tablet 3   No current facility-administered medications  for this visit.    Allergies: Allergies  Allergen Reactions   Nifedical Xl [Nifedipine] Other (See Comments)    Unknown reaction   Ativan [Lorazepam] Other (See Comments)    Altered mental state   Penicillins Hives    Has patient had a PCN reaction causing immediate rash, facial/tongue/throat swelling, SOB or lightheadedness with hypotension: Yes Has patient had a PCN reaction causing severe rash involving mucus membranes or skin necrosis: No Has patient had a PCN reaction that required hospitalization No Has patient had a PCN reaction occurring within the last 10 years: No If all of the above answers are "NO", then may proceed with Cephalosporin use.  HAS TOLERATED: cephalexin   Sulfacetamide Other (See Comments)    Unknown reaction   Sulfonamide Derivatives Other (See Comments)    Unknown reaction   Crestor [Rosuvastatin] Itching, Swelling and Rash   Erythromycin Rash   Firvanq [Vancomycin] Itching   Statins Itching, Swelling and Rash    Rash on his trunk and arms, swelling in his lips    Past Medical History:  Diagnosis Date   Acute ST elevation myocardial infarction (STEMI) of inferior wall (HCC) 03/01/2020   [facility] [place]   Anxiety    Arthritis    Back (05/04/2018)   Carotid artery disease (HCC)    a. 04/2018 s/p R carotid stenting. b. L TCAR 07/2022.   Chronic lower back pain    "have it at night" (05/04/2018)   CKD (chronic kidney disease), stage III (HCC)    Coronary artery disease    CVA (cerebral vascular accident) (HCC)    Depression    GERD (gastroesophageal reflux disease)    High triglycerides    History of kidney stones    Hyperlipidemia LDL goal <70    Hypertension    Hypertensive crisis 08/08/2022   Laryngeal carcinoma (HCC) 1998   PAF (paroxysmal atrial fibrillation) (HCC)    Peripheral vascular disease (HCC)    Pneumonia 11/11/2021   RBBB    Sinus bradycardia    Stroke [facility])    "he's had several little strokes; many that he  wasn't aware of" (05/04/2018)   TIA (transient ischemic attack)    Tobacco abuse    Tracheal stenosis    Tracheostomy in place [facility])    Past Surgical History:  Procedure Laterality Date   CAROTID STENT Right 06-13-12; 05/04/2018   CAROTID STENT INSERTION N/A 06/13/2012   Procedure: CAROTID STENT INSERTION;  Surgeon: Nada Libman, MD;  Location: [facility] CATH LAB;  Service: Cardiovascular;  Laterality: N/A;   CATARACT EXTRACTION, BILATERAL Bilateral    COLONOSCOPY  11/10/2011   Dr. Jena Gauss: hemorrhoids, tubular adenoma   CORNEAL TRANSPLANT Bilateral    CORONARY ARTERY BYPASS GRAFT  ~ 2003   "CABG X3"   CORONARY BALLOON ANGIOPLASTY N/A 11/26/2020   Procedure: CORONARY BALLOON ANGIOPLASTY;  Surgeon: Lennette Bihari, MD;  Location: [facility] INVASIVE CV LAB;  Service: Cardiovascular;  Laterality:  N/A;   CORONARY STENT INTERVENTION N/A 06/05/2020   Procedure: CORONARY STENT INTERVENTION;  Surgeon: Runell Gess, MD;  Location: [facility] INVASIVE CV LAB;  Service: Cardiovascular;  Laterality: N/A;   EYE SURGERY     INSERTION OF RETROGRADE CAROTID STENT Right 05/04/2018   Procedure: INSERTION OF RIGHT  CAROTID STENT using an ABBOT- XACT carotid stent system;  Surgeon: Nada Libman, MD;  Location: [facility] OR;  Service: Vascular;  Laterality: Right;   LAPAROSCOPIC CHOLECYSTECTOMY  2007   LEFT HEART CATH AND CORS/GRAFTS ANGIOGRAPHY N/A 10/07/2016   Procedure: Left Heart Cath and Cors/Grafts Angiography;  Surgeon: Lennette Bihari, MD;  Location: [facility] INVASIVE CV LAB;  Service: Cardiovascular;  Laterality: N/A;   LEFT HEART CATH AND CORS/GRAFTS ANGIOGRAPHY N/A 06/05/2020   Procedure: LEFT HEART CATH AND CORS/GRAFTS ANGIOGRAPHY;  Surgeon: Runell Gess, MD;  Location: [facility] INVASIVE CV LAB;  Service: Cardiovascular;  Laterality: N/A;   LEFT HEART CATH AND CORS/GRAFTS ANGIOGRAPHY N/A 11/26/2020   Procedure: LEFT HEART CATH AND CORS/GRAFTS ANGIOGRAPHY;  Surgeon: Lennette Bihari, MD;  Location: [facility] INVASIVE CV LAB;  Service:  Cardiovascular;  Laterality: N/A;   PARTIAL LARYNGECTOMY  1998   TRACHEOSTOMY  03/13/2018   "@ [facility]"   TRANSCAROTID ARTERY REVASCULARIZATION  Left 08/17/2022   Procedure: Transcarotid Artery Revascularization;  Surgeon: Victorino Sparrow, MD;  Location: [facility] OR;  Service: Vascular;  Laterality: Left;   TRANSURETHRAL RESECTION OF BLADDER TUMOR N/A 03/02/2023   Procedure: TRANSURETHRAL RESECTION OF BLADDER TUMOR (TURBT)- with gemcitabine instillation;  Surgeon: [name], MD;  Location: [facility] ORS;  Service: Urology;  Laterality: N/A;   Family History  Problem Relation Age of Onset   Dementia Mother    Heart disease Father    Cancer Brother    Heart disease Brother    Hyperlipidemia Brother    Cancer Brother    Colon cancer Neg Hx    Social History   Socioeconomic History   Marital status: Married    Spouse name: Not on file   Number of children: Not on file   Years of education: Not on file   Highest education level: Not on file  Occupational History   Occupation: retired    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  Tobacco Use   Smoking status: Former    Current packs/day: 0.00    Average packs/day: 1 pack/day for 17.3 years (17.3 ttl pk-yrs)    Types: Cigarettes    Start date: 04/22/1953    Quit date: 07/26/1970    Years since quitting: 52.7    Passive exposure: Never   Smokeless tobacco: Current    Types: Chew  Vaping Use   Vaping status: Never Used  Substance and Sexual Activity   Alcohol use: Never    Alcohol/week: 0.0 standard drinks of alcohol   Drug use: Never   Sexual activity: Not Currently  Other Topics Concern   Not on file  Social History Narrative   Walks on the treadmill every other day at the [facility] for the last couple of weeks.         Social Determinants of Health   Financial Resource Strain: Not on file  Food Insecurity: No Food Insecurity (03/02/2023)   Hunger Vital Sign    Worried About Running Out of Food in the Last Year: Never true    Ran Out of Food in  the Last Year: Never true  Transportation Needs: No Transportation Needs (03/02/2023)   PRAPARE - Transportation    Lack of  Transportation (Medical): No    Lack of Transportation (Non-Medical): No  Physical Activity: Not on file  Stress: Not on file  Social Connections: Not on file  Intimate Partner Violence: Not At Risk (03/02/2023)   Humiliation, Afraid, Rape, and Kick questionnaire    Fear of Current or Ex-Partner: No    Emotionally Abused: No    Physically Abused: No    Sexually Abused: No    Review of Systems Constitutional: Patient ***denies any unintentional weight loss or change in strength lntegumentary: Patient ***denies any rashes or pruritus Eyes: Patient denies ***dry eyes ENT: Patient ***denies dry mouth Cardiovascular: Patient ***denies chest pain or syncope Respiratory: Patient ***denies shortness of breath Gastrointestinal: Patient ***denies nausea, vomiting, constipation, or diarrhea Musculoskeletal: Patient ***denies muscle cramps or weakness Neurologic: Patient ***denies convulsions or seizures Psychiatric: Patient ***denies memory problems Allergic/Immunologic: Patient ***denies recent allergic reaction(s) Hematologic/Lymphatic: Patient denies bleeding tendencies Endocrine: Patient ***denies heat/cold intolerance  GU: As per HPI.  OBJECTIVE There were no vitals filed for this visit. There is no height or weight on file to calculate BMI.  Physical Examination Constitutional: ***No obvious distress; patient is ***non-toxic appearing  Cardiovascular: ***No visible lower extremity edema.  Respiratory: The patient does ***not have audible wheezing/stridor; respirations do ***not appear labored  Gastrointestinal: Abdomen ***non-distended Musculoskeletal: ***Normal ROM of UEs  Skin: ***No obvious rashes/open sores  Neurologic: CN 2-12 grossly ***intact Psychiatric: Answered questions ***appropriately with ***normal affect  Hematologic/Lymphatic/Immunologic:  ***No obvious bruises or sites of spontaneous bleeding  UA: ***negative / *** WBC/hpf, *** RBC/hpf, bacteria (***) PVR: *** ml  ASSESSMENT No diagnosis found. ***  Will plan for follow up in *** months / ***1 year or sooner if needed. Pt verbalized understanding and agreement. All questions were answered.  PLAN Advised the following: 1. *** 2. ***No follow-ups on file.  No orders of the defined types were placed in this encounter.   It has been explained that the patient is to follow regularly with their PCP in addition to all other providers involved in their care and to follow instructions provided by these respective offices. Patient advised to contact urology clinic if any urologic-pertaining questions, concerns, new symptoms or problems arise in the interim period.  There are no Patient Instructions on file for this visit.  Electronically signed by:  Donnita Falls, FNP   04/11/23    11:47 AM

## 2023-04-12 ENCOUNTER — Ambulatory Visit: Payer: Medicare HMO | Admitting: Urology

## 2023-04-12 DIAGNOSIS — R300 Dysuria: Secondary | ICD-10-CM

## 2023-04-12 DIAGNOSIS — R310 Gross hematuria: Secondary | ICD-10-CM

## 2023-04-12 DIAGNOSIS — N3289 Other specified disorders of bladder: Secondary | ICD-10-CM

## 2023-04-12 DIAGNOSIS — N281 Cyst of kidney, acquired: Secondary | ICD-10-CM

## 2023-04-12 DIAGNOSIS — N200 Calculus of kidney: Secondary | ICD-10-CM

## 2023-04-12 DIAGNOSIS — C679 Malignant neoplasm of bladder, unspecified: Secondary | ICD-10-CM

## 2023-04-21 ENCOUNTER — Emergency Department (HOSPITAL_COMMUNITY): Payer: Medicare HMO

## 2023-04-21 ENCOUNTER — Encounter (HOSPITAL_COMMUNITY): Payer: Self-pay | Admitting: Emergency Medicine

## 2023-04-21 ENCOUNTER — Inpatient Hospital Stay (HOSPITAL_COMMUNITY)
Admission: EM | Admit: 2023-04-21 | Discharge: 2023-04-26 | DRG: 291 | Disposition: E | Payer: Medicare HMO | Attending: Internal Medicine | Admitting: Internal Medicine

## 2023-04-21 ENCOUNTER — Other Ambulatory Visit: Payer: Self-pay

## 2023-04-21 DIAGNOSIS — I252 Old myocardial infarction: Secondary | ICD-10-CM

## 2023-04-21 DIAGNOSIS — N1831 Chronic kidney disease, stage 3a: Secondary | ICD-10-CM | POA: Diagnosis present

## 2023-04-21 DIAGNOSIS — R0602 Shortness of breath: Secondary | ICD-10-CM | POA: Diagnosis not present

## 2023-04-21 DIAGNOSIS — Z930 Tracheostomy status: Secondary | ICD-10-CM

## 2023-04-21 DIAGNOSIS — Z7951 Long term (current) use of inhaled steroids: Secondary | ICD-10-CM

## 2023-04-21 DIAGNOSIS — Z951 Presence of aortocoronary bypass graft: Secondary | ICD-10-CM

## 2023-04-21 DIAGNOSIS — D631 Anemia in chronic kidney disease: Secondary | ICD-10-CM | POA: Diagnosis present

## 2023-04-21 DIAGNOSIS — E1122 Type 2 diabetes mellitus with diabetic chronic kidney disease: Secondary | ICD-10-CM | POA: Diagnosis present

## 2023-04-21 DIAGNOSIS — K219 Gastro-esophageal reflux disease without esophagitis: Secondary | ICD-10-CM | POA: Diagnosis present

## 2023-04-21 DIAGNOSIS — Z7902 Long term (current) use of antithrombotics/antiplatelets: Secondary | ICD-10-CM

## 2023-04-21 DIAGNOSIS — Z9862 Peripheral vascular angioplasty status: Secondary | ICD-10-CM

## 2023-04-21 DIAGNOSIS — I493 Ventricular premature depolarization: Secondary | ICD-10-CM | POA: Diagnosis present

## 2023-04-21 DIAGNOSIS — R627 Adult failure to thrive: Secondary | ICD-10-CM | POA: Diagnosis present

## 2023-04-21 DIAGNOSIS — Z881 Allergy status to other antibiotic agents status: Secondary | ICD-10-CM

## 2023-04-21 DIAGNOSIS — C679 Malignant neoplasm of bladder, unspecified: Secondary | ICD-10-CM | POA: Diagnosis present

## 2023-04-21 DIAGNOSIS — Z800 Family history of malignant neoplasm of digestive organs: Secondary | ICD-10-CM

## 2023-04-21 DIAGNOSIS — J9621 Acute and chronic respiratory failure with hypoxia: Secondary | ICD-10-CM | POA: Diagnosis present

## 2023-04-21 DIAGNOSIS — N390 Urinary tract infection, site not specified: Secondary | ICD-10-CM | POA: Diagnosis present

## 2023-04-21 DIAGNOSIS — Z955 Presence of coronary angioplasty implant and graft: Secondary | ICD-10-CM

## 2023-04-21 DIAGNOSIS — Z8249 Family history of ischemic heart disease and other diseases of the circulatory system: Secondary | ICD-10-CM

## 2023-04-21 DIAGNOSIS — I4891 Unspecified atrial fibrillation: Secondary | ICD-10-CM | POA: Diagnosis present

## 2023-04-21 DIAGNOSIS — I509 Heart failure, unspecified: Secondary | ICD-10-CM

## 2023-04-21 DIAGNOSIS — G8929 Other chronic pain: Secondary | ICD-10-CM | POA: Diagnosis present

## 2023-04-21 DIAGNOSIS — Z8521 Personal history of malignant neoplasm of larynx: Secondary | ICD-10-CM

## 2023-04-21 DIAGNOSIS — M545 Low back pain, unspecified: Secondary | ICD-10-CM | POA: Diagnosis present

## 2023-04-21 DIAGNOSIS — Z882 Allergy status to sulfonamides status: Secondary | ICD-10-CM

## 2023-04-21 DIAGNOSIS — I10 Essential (primary) hypertension: Secondary | ICD-10-CM | POA: Diagnosis present

## 2023-04-21 DIAGNOSIS — J398 Other specified diseases of upper respiratory tract: Secondary | ICD-10-CM | POA: Diagnosis present

## 2023-04-21 DIAGNOSIS — R54 Age-related physical debility: Secondary | ICD-10-CM | POA: Diagnosis present

## 2023-04-21 DIAGNOSIS — J439 Emphysema, unspecified: Secondary | ICD-10-CM | POA: Diagnosis present

## 2023-04-21 DIAGNOSIS — Z515 Encounter for palliative care: Secondary | ICD-10-CM

## 2023-04-21 DIAGNOSIS — I2510 Atherosclerotic heart disease of native coronary artery without angina pectoris: Secondary | ICD-10-CM | POA: Diagnosis present

## 2023-04-21 DIAGNOSIS — I160 Hypertensive urgency: Secondary | ICD-10-CM | POA: Diagnosis present

## 2023-04-21 DIAGNOSIS — Z9221 Personal history of antineoplastic chemotherapy: Secondary | ICD-10-CM

## 2023-04-21 DIAGNOSIS — Z888 Allergy status to other drugs, medicaments and biological substances status: Secondary | ICD-10-CM

## 2023-04-21 DIAGNOSIS — Z79899 Other long term (current) drug therapy: Secondary | ICD-10-CM

## 2023-04-21 DIAGNOSIS — N1832 Chronic kidney disease, stage 3b: Secondary | ICD-10-CM | POA: Diagnosis present

## 2023-04-21 DIAGNOSIS — N179 Acute kidney failure, unspecified: Secondary | ICD-10-CM | POA: Diagnosis present

## 2023-04-21 DIAGNOSIS — I5021 Acute systolic (congestive) heart failure: Secondary | ICD-10-CM | POA: Diagnosis present

## 2023-04-21 DIAGNOSIS — Z7901 Long term (current) use of anticoagulants: Secondary | ICD-10-CM

## 2023-04-21 DIAGNOSIS — Z83438 Family history of other disorder of lipoprotein metabolism and other lipidemia: Secondary | ICD-10-CM

## 2023-04-21 DIAGNOSIS — R319 Hematuria, unspecified: Secondary | ICD-10-CM | POA: Diagnosis present

## 2023-04-21 DIAGNOSIS — Z809 Family history of malignant neoplasm, unspecified: Secondary | ICD-10-CM

## 2023-04-21 DIAGNOSIS — J810 Acute pulmonary edema: Secondary | ICD-10-CM

## 2023-04-21 DIAGNOSIS — I4510 Unspecified right bundle-branch block: Secondary | ICD-10-CM | POA: Diagnosis present

## 2023-04-21 DIAGNOSIS — I77810 Thoracic aortic ectasia: Secondary | ICD-10-CM | POA: Diagnosis present

## 2023-04-21 DIAGNOSIS — Z85819 Personal history of malignant neoplasm of unspecified site of lip, oral cavity, and pharynx: Secondary | ICD-10-CM

## 2023-04-21 DIAGNOSIS — I130 Hypertensive heart and chronic kidney disease with heart failure and stage 1 through stage 4 chronic kidney disease, or unspecified chronic kidney disease: Secondary | ICD-10-CM | POA: Diagnosis not present

## 2023-04-21 DIAGNOSIS — E1151 Type 2 diabetes mellitus with diabetic peripheral angiopathy without gangrene: Secondary | ICD-10-CM | POA: Diagnosis present

## 2023-04-21 DIAGNOSIS — H9190 Unspecified hearing loss, unspecified ear: Secondary | ICD-10-CM | POA: Diagnosis present

## 2023-04-21 DIAGNOSIS — Z720 Tobacco use: Secondary | ICD-10-CM

## 2023-04-21 DIAGNOSIS — E781 Pure hyperglyceridemia: Secondary | ICD-10-CM | POA: Diagnosis present

## 2023-04-21 DIAGNOSIS — Z923 Personal history of irradiation: Secondary | ICD-10-CM

## 2023-04-21 DIAGNOSIS — Z8551 Personal history of malignant neoplasm of bladder: Secondary | ICD-10-CM

## 2023-04-21 DIAGNOSIS — Z880 Allergy status to penicillin: Secondary | ICD-10-CM

## 2023-04-21 DIAGNOSIS — I340 Nonrheumatic mitral (valve) insufficiency: Secondary | ICD-10-CM | POA: Diagnosis present

## 2023-04-21 DIAGNOSIS — Z66 Do not resuscitate: Secondary | ICD-10-CM | POA: Diagnosis present

## 2023-04-21 DIAGNOSIS — J9611 Chronic respiratory failure with hypoxia: Secondary | ICD-10-CM | POA: Diagnosis present

## 2023-04-21 DIAGNOSIS — Z820 Family history of epilepsy and other diseases of the nervous system: Secondary | ICD-10-CM

## 2023-04-21 DIAGNOSIS — I480 Paroxysmal atrial fibrillation: Secondary | ICD-10-CM | POA: Diagnosis present

## 2023-04-21 DIAGNOSIS — J8410 Pulmonary fibrosis, unspecified: Secondary | ICD-10-CM | POA: Diagnosis present

## 2023-04-21 DIAGNOSIS — Z8673 Personal history of transient ischemic attack (TIA), and cerebral infarction without residual deficits: Secondary | ICD-10-CM

## 2023-04-21 HISTORY — DX: Malignant neoplasm of bladder, unspecified: C67.9

## 2023-04-21 LAB — TROPONIN I (HIGH SENSITIVITY)
Troponin I (High Sensitivity): 297 ng/L (ref <18)
Troponin I (High Sensitivity): 295 ng/L (ref <18)

## 2023-04-21 LAB — URINALYSIS, W/ REFLEX TO CULTURE (INFECTION SUSPECTED)
Bacteria, UA: NONE SEEN
Bilirubin Urine: NEGATIVE
Glucose, UA: NEGATIVE mg/dL
Ketones, ur: NEGATIVE mg/dL
Nitrite: NEGATIVE
Protein, ur: 30 mg/dL — AB
RBC / HPF: >50 RBC/hpf (ref 0–5)
Specific Gravity, Urine: 1.017 (ref 1.005–1.030)
WBC, UA: >50 WBC/hpf (ref 0–5)
pH: 5 (ref 5.0–8.0)

## 2023-04-21 LAB — CBC
HCT: 29.8 % — ABNORMAL LOW (ref 39.0–52.0)
Hemoglobin: 9 g/dL — ABNORMAL LOW (ref 13.0–17.0)
MCH: 26.9 pg (ref 26.0–34.0)
MCHC: 30.2 g/dL (ref 30.0–36.0)
MCV: 89.2 fL (ref 80.0–100.0)
Platelets: 315 10*3/uL (ref 150–400)
RBC: 3.34 MIL/uL — ABNORMAL LOW (ref 4.22–5.81)
RDW: 15.9 % — ABNORMAL HIGH (ref 11.5–15.5)
WBC: 9.8 10*3/uL (ref 4.0–10.5)
nRBC: 0 % (ref 0.0–0.2)

## 2023-04-21 LAB — BASIC METABOLIC PANEL
Anion gap: 13 (ref 5–15)
BUN: 40 mg/dL — ABNORMAL HIGH (ref 8–23)
CO2: 24 mmol/L (ref 22–32)
Calcium: 8.6 mg/dL — ABNORMAL LOW (ref 8.9–10.3)
Chloride: 97 mmol/L — ABNORMAL LOW (ref 98–111)
Creatinine, Ser: 2.26 mg/dL — ABNORMAL HIGH (ref 0.61–1.24)
GFR, Estimated: 28 mL/min — ABNORMAL LOW (ref >60)
Glucose, Bld: 163 mg/dL — ABNORMAL HIGH (ref 70–99)
Potassium: 3.7 mmol/L (ref 3.5–5.1)
Sodium: 134 mmol/L — ABNORMAL LOW (ref 135–145)

## 2023-04-21 LAB — BRAIN NATRIURETIC PEPTIDE: B Natriuretic Peptide: 1131.7 pg/mL — ABNORMAL HIGH (ref 0.0–100.0)

## 2023-04-21 MED ORDER — ASPIRIN 325 MG PO TBEC
325.0000 mg | DELAYED_RELEASE_TABLET | Freq: Every day | ORAL | Status: DC
  Administered 2023-04-21 – 2023-04-22 (×2): 325 mg via ORAL
  Filled 2023-04-21 (×2): qty 1

## 2023-04-21 MED ORDER — ACETAMINOPHEN 325 MG PO TABS
650.0000 mg | ORAL_TABLET | Freq: Once | ORAL | Status: AC
  Administered 2023-04-21: 650 mg via ORAL
  Filled 2023-04-21: qty 2

## 2023-04-21 MED ORDER — FUROSEMIDE 10 MG/ML IJ SOLN
40.0000 mg | Freq: Once | INTRAMUSCULAR | Status: AC
  Administered 2023-04-21: 40 mg via INTRAVENOUS
  Filled 2023-04-21: qty 4

## 2023-04-21 MED ORDER — MORPHINE SULFATE (PF) 4 MG/ML IV SOLN
4.0000 mg | Freq: Once | INTRAVENOUS | Status: AC
  Administered 2023-04-21: 4 mg via INTRAVENOUS
  Filled 2023-04-21: qty 1

## 2023-04-21 NOTE — ED Provider Notes (Signed)
[place] EMERGENCY DEPARTMENT AT [facility] Provider Note   CSN: [id number] Arrival date & time: 04/24/2023  1951     History  Chief Complaint  Patient presents with   Shortness of Breath    Kenneth [name] is a 82 y.o. male.   Shortness of Breath  82 year old male with past medical history of CAD status post CABG in 2003 in 2007, hyperlipidemia, hypertension, throat cancer status post radiation and surgery with tracheostomy, prior CVA, atrial fibrillation, COPD presenting for evaluation of shortness of breath.  Patient was recently admitted to a hospital in [place].  He was discharged from the hospital on 04/13/2023.  He was admitted for worsening shortness of breath, found to have bilateral pulmonary effusions that required drainage.  Also was treated for pneumonia at that time.  Since being discharged home, patient's respiratory status has gradually declined.  Family states that he has become more short of breath and requiring increasing oxygen through his trach site.  They also comment upon worsening lower extremity edema.  Patient denies any fevers, chest pain, abdominal pain, pain with urination, urinary frequency.  When he was discharged from the hospital he was instructed to take Lasix on an as-needed basis.  He only took 1 dose last evening.  Of note, patient has been prescribed Eliquis for atrial fibrillation.  However, this was stopped recently due to hematuria.  He has been followed closely by urology and cardiology.      Home Medications Prior to Admission medications   Medication Sig Start Date End Date Taking? Authorizing Provider  acetaminophen (TYLENOL) 500 MG tablet Take 500 mg by mouth every 6 (six) hours as needed for headache, fever, moderate pain or mild pain.    [provider]  apixaban (ELIQUIS) 2.5 MG TABS tablet Take 1 tablet (2.5 mg total) by mouth 2 (two) times daily. Patient not taking: Reported on 03/30/2023 10/01/22   Antoine Poche, MD  budesonide (PULMICORT) 0.5 MG/2ML nebulizer solution Take 2 mLs (0.5 mg total) by nebulization 2 (two) times daily. 03/09/23   Coralyn Helling, MD  carvedilol (COREG) 12.5 MG tablet Take 1.5 tablets (18.75 mg total) by mouth 2 (two) times daily. 09/16/22 09/11/23  Laurann Montana, PA-C  clopidogrel (PLAVIX) 75 MG tablet Take 1 tablet (75 mg total) by mouth daily. Patient not taking: Reported on 03/30/2023 10/04/22   Laurann Montana, PA-C  docusate sodium (COLACE) 100 MG capsule Take 1 capsule (100 mg total) by mouth 2 (two) times daily. 03/04/23   Rolly Salter, MD  Evolocumab (REPATHA SURECLICK) 140 MG/ML SOAJ Inject 140 mg into the skin every 14 (fourteen) days. 09/15/22   Antoine Poche, MD  ezetimibe (ZETIA) 10 MG tablet Take 1 tablet by mouth once daily 03/07/23   Antoine Poche, MD  furosemide (LASIX) 20 MG tablet Take 20 mg daily as needed for swelling 03/30/23   Antoine Poche, MD  guaiFENesin (MUCINEX) 600 MG 12 hr tablet Take 2 tablets (1,200 mg total) by mouth 2 (two) times daily as needed. 03/09/23   Coralyn Helling, MD  HYDROcodone-acetaminophen (NORCO/VICODIN) 5-325 MG tablet Take 1 tablet by mouth every 6 (six) hours as needed for moderate pain or severe pain. 03/04/23   Rolly Salter, MD  hyoscyamine (LEVSIN SL) 0.125 MG SL tablet Place 1 tablet (0.125 mg total) under the tongue every 4 (four) hours as needed (bladder spasms). 03/04/23   Rolly Salter, MD  ipratropium (ATROVENT) 0.02 % nebulizer  solution Take 2.5 mLs (0.5 mg total) by nebulization 4 (four) times daily. 03/09/23   Coralyn Helling, MD  levalbuterol [facility] HFA) 45 MCG/ACT inhaler Inhale 1 puff into the lungs every 4 (four) hours as needed for wheezing or shortness of breath. 03/04/23 03/03/24  Rolly Salter, MD  levalbuterol Pauline Aus) 0.63 MG/3ML nebulizer solution Take 3 mLs (0.63 mg total) by nebulization every 4 (four) hours as needed for wheezing or shortness of breath. 03/09/23   Coralyn Helling, MD  Melatonin 10 MG  TABS Take 10 mg by mouth at bedtime.    [provider]  nitroGLYCERIN (NITROSTAT) 0.4 MG SL tablet Place 1 tablet (0.4 mg total) under the tongue every 5 (five) minutes as needed for chest pain. 01/31/17 05/28/23  Jodelle Gross, NP  pantoprazole (PROTONIX) 40 MG tablet Take 1 tablet by mouth once daily 07/28/21   Antoine Poche, MD  PARoxetine (PAXIL) 20 MG tablet Take 20 mg by mouth daily.    [provider]  polyethylene glycol (MIRALAX / GLYCOLAX) 17 g packet Take 17 g by mouth daily. 03/04/23   Rolly Salter, MD  spironolactone (ALDACTONE) 25 MG tablet Take 1 tablet (25 mg total) by mouth daily. 09/16/22 09/11/23  Dunn, Tacey Ruiz, PA-C      Allergies    Nifedical xl [nifedipine], Ativan [lorazepam], Penicillins, Sulfacetamide, Sulfonamide derivatives, Crestor [rosuvastatin], Erythromycin, Firvanq [vancomycin], and Statins    Review of Systems   Review of Systems  Respiratory:  Positive for shortness of breath.     Physical Exam Updated Vital Signs BP 106/75   Pulse (!) 40   Temp 98.5 F (36.9 C) (Oral)   Resp (!) 27   Ht 5\' 6"  (1.676 m)   Wt 83 kg   SpO2 100%   BMI 29.53 kg/m  Physical Exam Constitutional:      General: He is not in acute distress.    Appearance: He is not ill-appearing.  HENT:     Head: Normocephalic.     Mouth/Throat:     Mouth: Mucous membranes are moist.  Eyes:     Pupils: Pupils are equal, round, and reactive to light.  Neck:     Comments: Trach collar in place.  Receiving supplemental 4 L  Cardiovascular:     Rate and Rhythm: Tachycardia present. Rhythm irregular.  Pulmonary:     Effort: Pulmonary effort is normal. No tachypnea.     Breath sounds: Rhonchi present.     Comments: Scattered rhonchi all lung fields Chest:     Chest wall: No mass or tenderness.  Abdominal:     Palpations: Abdomen is soft.     Tenderness: There is no abdominal tenderness. There is no guarding or rebound.  Musculoskeletal:     Right lower  leg: Edema present.     Left lower leg: Edema present.     Comments: 1+ edema distally bilateral lower extremities  Skin:    General: Skin is warm and dry.     Findings: No rash.  Neurological:     General: No focal deficit present.     Mental Status: He is alert.     ED Results / Procedures / Treatments   Labs (all labs ordered are listed, but only abnormal results are displayed) Labs Reviewed  BRAIN NATRIURETIC PEPTIDE - Abnormal; Notable for the following components:      Result Value   B Natriuretic Peptide 1,131.7 (*)    All other components within normal limits  BASIC METABOLIC PANEL - Abnormal; Notable for the following components:   Sodium 134 (*)    Chloride 97 (*)    Glucose, Bld 163 (*)    BUN 40 (*)    Creatinine, Ser 2.26 (*)    Calcium 8.6 (*)    GFR, Estimated 28 (*)    All other components within normal limits  CBC - Abnormal; Notable for the following components:   RBC 3.34 (*)    Hemoglobin 9.0 (*)    HCT 29.8 (*)    RDW 15.9 (*)    All other components within normal limits  URINALYSIS, W/ REFLEX TO CULTURE (INFECTION SUSPECTED) - Abnormal; Notable for the following components:   APPearance HAZY (*)    Hgb urine dipstick LARGE (*)    Protein, ur 30 (*)    Leukocytes,Ua LARGE (*)    All other components within normal limits  TROPONIN I (HIGH SENSITIVITY) - Abnormal; Notable for the following components:   Troponin I (High Sensitivity) 297 (*)    All other components within normal limits  URINE CULTURE  TROPONIN I (HIGH SENSITIVITY)    EKG EKG Interpretation Date/Time:  Thursday April 21 2023 20:38:50 EDT Ventricular Rate:  117 PR Interval:    QRS Duration:  134 QT Interval:  293 QTC Calculation: 409 R Axis:   117  Text Interpretation: Atrial fibrillation Paired ventricular premature complexes RBBB and LPFB Since prior ECG, rate has increased, atrial fibrillation is new Confirmed by Alvira Monday ([id number]) on 04/19/2023 9:15:22  PM  Radiology DG Chest Portable 1 View  Result Date: 03/29/2023 CLINICAL DATA:  Shortness of breath. EXAM: PORTABLE CHEST 1 VIEW COMPARISON:  03/03/2023. FINDINGS: The heart is enlarged and the mediastinal contour is stable. A tracheostomy tube terminates 3.5 cm above the carina. There is evidence of prior cardiothoracic surgery. Mildly increased interstitial prominence with scattered airspace opacities at the lung bases. No effusion or pneumothorax. A vascular stent is noted in the superior mediastinum on the right. IMPRESSION: Increased interstitial prominence bilaterally with airspace opacities at the lung bases. Electronically Signed   By: Thornell Sartorius M.D.   On: 04/07/2023 22:53    Procedures Procedures    Medications Ordered in ED Medications  aspirin EC tablet 325 mg (325 mg Oral Given 04/13/2023 2128)  acetaminophen (TYLENOL) tablet 650 mg (650 mg Oral Given 04/15/2023 2111)  furosemide (LASIX) injection 40 mg (40 mg Intravenous Given 04/22/2023 2143)  morphine (PF) 4 MG/ML injection 4 mg (4 mg Intravenous Given 04/06/2023 2306)    ED Course/ Medical Decision Making/ A&P                                 Medical Decision Making Amount and/or Complexity of Data Reviewed Labs: ordered. Radiology: ordered.  Risk OTC drugs. Prescription drug management. Decision regarding hospitalization.   Kenneth [name] is a 82 y.o. male with PMH of CAD status post CABG in 2003 in 2007, hyperlipidemia, hypertension, throat cancer status post radiation and surgery with tracheostomy, prior CVA, atrial fibrillation, COPD, who presents to the ED with worsening shortness of breath and oxygen requirement. Exam notable for rhonchorous lung exam with bilateral distal lower extremity edema.  Afebrile, hemodynamically stable, SaO2 greater than 95% on 4 L trach collar.  Of note, patient's heart rate is irregularly irregular with a rate ranging between 105-120 when I am in the room.  Differential diagnosis  includes: pneumonia, COPD exacerbation, CHF  exacerbation, ACS, pulmonary embolism, pneumothorax.   EKG reviewed and shows atrial fibrillation with right bundle branch block.  Baseline is irregular, but no obvious ST segment changes.  Atrial fibrillation has replaced a sinus rhythm from July 2024.  Chest x-ray demonstrates bilateral interstitial opacities concerning for pulmonary edema.  No obvious focal consolidation, atypical infection remains within the differential, patient has remained afebrile with a nonproductive cough.  Patient's laboratory workup reviewed.  He appears dry with a BUN of 40 and a creatinine of 2.26, representing AKI on CKD.  Hemoglobin is mildly low at 9.  Of note, troponin is 297, significantly increased from 14 to several months ago.  His BNP is greater than 1100 as well, also representing a significant increase.  IV Lasix provided while in the emergency department.  Given patient's worsening shortness of breath accompanied by pulmonary edema on chest x-ray, feel that heart failure exacerbation is most likely etiology.  Elevated troponin felt to be suggestive of demand etiology rather than ACS, but would nevertheless need to be followed closely.  Disposition: Admit to hospitalist team.    The plan for this patient was discussed with Dr. Dalene Seltzer, who voiced agreement and who oversaw evaluation and treatment of this patient.     Final Clinical Impression(s) / ED Diagnoses Final diagnoses:  Shortness of breath    Rx / DC Orders ED Discharge Orders     None         Lyman Speller, MD 04/13/2023 [id number]    Alvira Monday, MD 05/07/2023 1419

## 2023-04-21 NOTE — ED Triage Notes (Addendum)
Patient here with progressively worsening shortness of breath. Was hospitalized at [facility] at the beginning of the month with PNA and had fluid removed from his lungs approx 2 L per family. Patient chronically trached for 8-10 years however until hospitalization earlier in the month did not require any O2. Patient now requiring 4 L of O2. Tachypneic in triage, pale in color.

## 2023-04-22 ENCOUNTER — Inpatient Hospital Stay (HOSPITAL_COMMUNITY): Payer: Medicare HMO

## 2023-04-22 ENCOUNTER — Encounter (HOSPITAL_COMMUNITY): Payer: Self-pay | Admitting: Internal Medicine

## 2023-04-22 ENCOUNTER — Observation Stay (HOSPITAL_COMMUNITY): Payer: Medicare HMO

## 2023-04-22 DIAGNOSIS — J810 Acute pulmonary edema: Secondary | ICD-10-CM | POA: Diagnosis not present

## 2023-04-22 DIAGNOSIS — N1831 Chronic kidney disease, stage 3a: Secondary | ICD-10-CM | POA: Diagnosis not present

## 2023-04-22 DIAGNOSIS — R0602 Shortness of breath: Secondary | ICD-10-CM | POA: Diagnosis present

## 2023-04-22 DIAGNOSIS — Z930 Tracheostomy status: Secondary | ICD-10-CM | POA: Diagnosis not present

## 2023-04-22 DIAGNOSIS — E781 Pure hyperglyceridemia: Secondary | ICD-10-CM | POA: Diagnosis present

## 2023-04-22 DIAGNOSIS — J9621 Acute and chronic respiratory failure with hypoxia: Secondary | ICD-10-CM

## 2023-04-22 DIAGNOSIS — I5043 Acute on chronic combined systolic (congestive) and diastolic (congestive) heart failure: Secondary | ICD-10-CM

## 2023-04-22 DIAGNOSIS — I4891 Unspecified atrial fibrillation: Secondary | ICD-10-CM | POA: Diagnosis not present

## 2023-04-22 DIAGNOSIS — Z66 Do not resuscitate: Secondary | ICD-10-CM | POA: Diagnosis present

## 2023-04-22 DIAGNOSIS — N3001 Acute cystitis with hematuria: Secondary | ICD-10-CM

## 2023-04-22 DIAGNOSIS — I77810 Thoracic aortic ectasia: Secondary | ICD-10-CM | POA: Diagnosis present

## 2023-04-22 DIAGNOSIS — I480 Paroxysmal atrial fibrillation: Secondary | ICD-10-CM | POA: Diagnosis present

## 2023-04-22 DIAGNOSIS — I160 Hypertensive urgency: Secondary | ICD-10-CM | POA: Diagnosis present

## 2023-04-22 DIAGNOSIS — Z9861 Coronary angioplasty status: Secondary | ICD-10-CM

## 2023-04-22 DIAGNOSIS — I10 Essential (primary) hypertension: Secondary | ICD-10-CM

## 2023-04-22 DIAGNOSIS — I130 Hypertensive heart and chronic kidney disease with heart failure and stage 1 through stage 4 chronic kidney disease, or unspecified chronic kidney disease: Secondary | ICD-10-CM | POA: Diagnosis present

## 2023-04-22 DIAGNOSIS — J8410 Pulmonary fibrosis, unspecified: Secondary | ICD-10-CM | POA: Diagnosis present

## 2023-04-22 DIAGNOSIS — N390 Urinary tract infection, site not specified: Secondary | ICD-10-CM | POA: Diagnosis present

## 2023-04-22 DIAGNOSIS — J9611 Chronic respiratory failure with hypoxia: Secondary | ICD-10-CM | POA: Diagnosis present

## 2023-04-22 DIAGNOSIS — I509 Heart failure, unspecified: Secondary | ICD-10-CM | POA: Diagnosis not present

## 2023-04-22 DIAGNOSIS — N179 Acute kidney failure, unspecified: Secondary | ICD-10-CM | POA: Diagnosis present

## 2023-04-22 DIAGNOSIS — N1832 Chronic kidney disease, stage 3b: Secondary | ICD-10-CM | POA: Diagnosis present

## 2023-04-22 DIAGNOSIS — I5021 Acute systolic (congestive) heart failure: Secondary | ICD-10-CM | POA: Diagnosis present

## 2023-04-22 DIAGNOSIS — I340 Nonrheumatic mitral (valve) insufficiency: Secondary | ICD-10-CM | POA: Diagnosis present

## 2023-04-22 DIAGNOSIS — K219 Gastro-esophageal reflux disease without esophagitis: Secondary | ICD-10-CM | POA: Diagnosis present

## 2023-04-22 DIAGNOSIS — Z515 Encounter for palliative care: Secondary | ICD-10-CM | POA: Diagnosis not present

## 2023-04-22 DIAGNOSIS — I2510 Atherosclerotic heart disease of native coronary artery without angina pectoris: Secondary | ICD-10-CM | POA: Diagnosis present

## 2023-04-22 DIAGNOSIS — C679 Malignant neoplasm of bladder, unspecified: Secondary | ICD-10-CM | POA: Diagnosis present

## 2023-04-22 DIAGNOSIS — E1122 Type 2 diabetes mellitus with diabetic chronic kidney disease: Secondary | ICD-10-CM | POA: Diagnosis present

## 2023-04-22 DIAGNOSIS — R627 Adult failure to thrive: Secondary | ICD-10-CM | POA: Diagnosis present

## 2023-04-22 DIAGNOSIS — J439 Emphysema, unspecified: Secondary | ICD-10-CM | POA: Diagnosis present

## 2023-04-22 DIAGNOSIS — D631 Anemia in chronic kidney disease: Secondary | ICD-10-CM | POA: Diagnosis present

## 2023-04-22 DIAGNOSIS — E1151 Type 2 diabetes mellitus with diabetic peripheral angiopathy without gangrene: Secondary | ICD-10-CM | POA: Diagnosis present

## 2023-04-22 DIAGNOSIS — I252 Old myocardial infarction: Secondary | ICD-10-CM | POA: Diagnosis not present

## 2023-04-22 LAB — ECHOCARDIOGRAM COMPLETE
Height: 66 in
MV M vel: 3.56 m/s
MV Peak grad: 50.7 mm[Hg]
S' Lateral: 4 cm
Weight: 2927.71 [oz_av]

## 2023-04-22 LAB — CBC
HCT: 29.9 % — ABNORMAL LOW (ref 39.0–52.0)
Hemoglobin: 9.3 g/dL — ABNORMAL LOW (ref 13.0–17.0)
MCH: 27.8 pg (ref 26.0–34.0)
MCHC: 31.1 g/dL (ref 30.0–36.0)
MCV: 89.3 fL (ref 80.0–100.0)
Platelets: 292 10*3/uL (ref 150–400)
RBC: 3.35 MIL/uL — ABNORMAL LOW (ref 4.22–5.81)
RDW: 15.9 % — ABNORMAL HIGH (ref 11.5–15.5)
WBC: 9.8 10*3/uL (ref 4.0–10.5)
nRBC: 0 % (ref 0.0–0.2)

## 2023-04-22 LAB — BASIC METABOLIC PANEL
Anion gap: 13 (ref 5–15)
BUN: 39 mg/dL — ABNORMAL HIGH (ref 8–23)
CO2: 25 mmol/L (ref 22–32)
Calcium: 8.4 mg/dL — ABNORMAL LOW (ref 8.9–10.3)
Chloride: 96 mmol/L — ABNORMAL LOW (ref 98–111)
Creatinine, Ser: 2.21 mg/dL — ABNORMAL HIGH (ref 0.61–1.24)
GFR, Estimated: 29 mL/min — ABNORMAL LOW (ref >60)
Glucose, Bld: 119 mg/dL — ABNORMAL HIGH (ref 70–99)
Potassium: 3.6 mmol/L (ref 3.5–5.1)
Sodium: 134 mmol/L — ABNORMAL LOW (ref 135–145)

## 2023-04-22 MED ORDER — MOMETASONE FURO-FORMOTEROL FUM 100-5 MCG/ACT IN AERO
2.0000 | INHALATION_SPRAY | Freq: Two times a day (BID) | RESPIRATORY_TRACT | Status: DC
  Filled 2023-04-22: qty 8.8

## 2023-04-22 MED ORDER — FUROSEMIDE 10 MG/ML IJ SOLN
40.0000 mg | Freq: Once | INTRAMUSCULAR | Status: AC
  Administered 2023-04-22: 40 mg via INTRAVENOUS
  Filled 2023-04-22: qty 4

## 2023-04-22 MED ORDER — ALBUTEROL SULFATE (2.5 MG/3ML) 0.083% IN NEBU
2.5000 mg | INHALATION_SOLUTION | Freq: Once | RESPIRATORY_TRACT | Status: AC | PRN
  Administered 2023-04-22: 2.5 mg via RESPIRATORY_TRACT
  Filled 2023-04-22: qty 3

## 2023-04-22 MED ORDER — FUROSEMIDE 10 MG/ML IJ SOLN
40.0000 mg | Freq: Two times a day (BID) | INTRAMUSCULAR | Status: DC
  Administered 2023-04-22: 40 mg via INTRAVENOUS
  Filled 2023-04-22: qty 4

## 2023-04-22 MED ORDER — ONDANSETRON HCL 4 MG/2ML IJ SOLN: 4.0000 mg | Freq: Four times a day (QID) | INTRAMUSCULAR | Status: DC | PRN

## 2023-04-22 MED ORDER — FUROSEMIDE 10 MG/ML IJ SOLN
40.0000 mg | Freq: Every day | INTRAMUSCULAR | Status: DC
  Administered 2023-04-22: 40 mg via INTRAVENOUS
  Filled 2023-04-22: qty 4

## 2023-04-22 MED ORDER — ACETAMINOPHEN 650 MG RE SUPP: 650.0000 mg | Freq: Four times a day (QID) | RECTAL | Status: DC | PRN

## 2023-04-22 MED ORDER — ACETAMINOPHEN 325 MG PO TABS: 650.0000 mg | ORAL_TABLET | Freq: Four times a day (QID) | ORAL | Status: DC | PRN

## 2023-04-22 MED ORDER — MORPHINE 100MG IN NS 100ML (1MG/ML) PREMIX INFUSION
0.0000 mg/h | INTRAVENOUS | Status: DC
  Administered 2023-04-22: 5 mg/h via INTRAVENOUS
  Administered 2023-04-23: 10 mg/h via INTRAVENOUS
  Filled 2023-04-22 (×2): qty 100

## 2023-04-22 MED ORDER — ARFORMOTEROL TARTRATE 15 MCG/2ML IN NEBU
15.0000 ug | INHALATION_SOLUTION | Freq: Two times a day (BID) | RESPIRATORY_TRACT | Status: DC
  Administered 2023-04-22 (×2): 15 ug via RESPIRATORY_TRACT
  Filled 2023-04-22 (×2): qty 2

## 2023-04-22 MED ORDER — LEVALBUTEROL HCL 0.63 MG/3ML IN NEBU
0.6300 mg | INHALATION_SOLUTION | Freq: Four times a day (QID) | RESPIRATORY_TRACT | Status: DC
  Administered 2023-04-22 (×2): 0.63 mg via RESPIRATORY_TRACT
  Filled 2023-04-22 (×2): qty 3

## 2023-04-22 MED ORDER — MORPHINE SULFATE (PF) 2 MG/ML IV SOLN
1.0000 mg | INTRAVENOUS | Status: DC | PRN
  Administered 2023-04-22 (×2): 1 mg via INTRAVENOUS
  Filled 2023-04-22 (×2): qty 1

## 2023-04-22 MED ORDER — LORAZEPAM 2 MG/ML IJ SOLN: 2.0000 mg | INTRAMUSCULAR | Status: DC | PRN

## 2023-04-22 MED ORDER — LEVALBUTEROL HCL 0.63 MG/3ML IN NEBU: 0.6300 mg | INHALATION_SOLUTION | Freq: Four times a day (QID) | RESPIRATORY_TRACT | Status: DC | PRN

## 2023-04-22 MED ORDER — SODIUM CHLORIDE 0.9 % IV SOLN: INTRAVENOUS | Status: DC

## 2023-04-22 MED ORDER — MORPHINE SULFATE (PF) 2 MG/ML IV SOLN
2.0000 mg | INTRAVENOUS | Status: DC | PRN
  Administered 2023-04-22: 2 mg via INTRAVENOUS
  Filled 2023-04-22: qty 1

## 2023-04-22 MED ORDER — DILTIAZEM LOAD VIA INFUSION
10.0000 mg | Freq: Once | INTRAVENOUS | Status: AC
  Administered 2023-04-22: 10 mg via INTRAVENOUS
  Filled 2023-04-22: qty 10

## 2023-04-22 MED ORDER — MORPHINE BOLUS VIA INFUSION
5.0000 mg | INTRAVENOUS | Status: DC | PRN
  Administered 2023-04-22 – 2023-04-23 (×2): 5 mg via INTRAVENOUS

## 2023-04-22 MED ORDER — DILTIAZEM HCL-DEXTROSE 125-5 MG/125ML-% IV SOLN (PREMIX)
5.0000 mg/h | INTRAVENOUS | Status: DC
  Administered 2023-04-22: 5 mg/h via INTRAVENOUS
  Filled 2023-04-22: qty 125

## 2023-04-22 MED ORDER — GLYCOPYRROLATE 0.2 MG/ML IJ SOLN
0.2000 mg | INTRAMUSCULAR | Status: DC | PRN
  Administered 2023-04-23: 0.2 mg via INTRAVENOUS
  Filled 2023-04-22: qty 1

## 2023-04-22 MED ORDER — POLYVINYL ALCOHOL 1.4 % OP SOLN: 1.0000 [drp] | Freq: Four times a day (QID) | OPHTHALMIC | Status: DC | PRN

## 2023-04-22 MED ORDER — SODIUM CHLORIDE 0.9 % IV SOLN
1.0000 g | INTRAVENOUS | Status: DC
  Administered 2023-04-22: 1 g via INTRAVENOUS
  Filled 2023-04-22 (×2): qty 10

## 2023-04-22 MED ORDER — GLYCOPYRROLATE 1 MG PO TABS: 1.0000 mg | ORAL_TABLET | ORAL | Status: DC | PRN

## 2023-04-22 MED ORDER — CARVEDILOL 6.25 MG PO TABS
18.7500 mg | ORAL_TABLET | Freq: Two times a day (BID) | ORAL | Status: DC
  Administered 2023-04-22 (×2): 18.75 mg via ORAL
  Filled 2023-04-22 (×2): qty 1

## 2023-04-22 MED ORDER — ONDANSETRON HCL 4 MG PO TABS: 4.0000 mg | ORAL_TABLET | Freq: Four times a day (QID) | ORAL | Status: DC | PRN

## 2023-04-22 MED ORDER — GLYCOPYRROLATE 0.2 MG/ML IJ SOLN: 0.2000 mg | INTRAMUSCULAR | Status: DC | PRN

## 2023-04-22 MED ORDER — MORPHINE SULFATE (PF) 2 MG/ML IV SOLN
1.0000 mg | Freq: Once | INTRAVENOUS | Status: AC
  Administered 2023-04-22: 1 mg via INTRAVENOUS
  Filled 2023-04-22: qty 1

## 2023-04-22 NOTE — Progress Notes (Signed)
   04/22/23 1134  Assess: MEWS Score  Pulse Rate (!) 126  ECG Heart Rate (!) 145  Resp (!) 24  SpO2 92 %  O2 Device Tracheostomy Collar  O2 Flow Rate (L/min) 9 L/min  FiO2 (%) 30 %  Assess: MEWS Score  MEWS Temp 0  MEWS Systolic 0  MEWS Pulse 3  MEWS RR 1  MEWS LOC 0  MEWS Score 4  MEWS Score Color Red  Assess: if the MEWS score is Yellow or Red  Were vital signs accurate and taken at a resting state? Yes  Does the patient meet 2 or more of the SIRS criteria? No  MEWS guidelines implemented  Yes, red  Treat  MEWS Interventions Considered administering scheduled or prn medications/treatments as ordered  Take Vital Signs  Increase Vital Sign Frequency  Red: Q1hr x2, continue Q4hrs until patient remains green for 12hrs  Escalate  MEWS: Escalate Red: Discuss with charge nurse and notify provider. Consider notifying RRT. If remains red for 2 hours consider need for higher level of care  Notify: Charge Nurse/RN  Name of Charge Nurse/RN Notified Darl Pikes, RN  Provider Notification  Provider Name/Title Jomarie Longs, MD  Date Provider Notified 04/22/23  Time Provider Notified 1146  Provider response See new orders  Date of Provider Response 04/22/23  Time of Provider Response 1147  Assess: SIRS CRITERIA  SIRS Temperature  0  SIRS Pulse 1  SIRS Respirations  1  SIRS WBC 0  SIRS Score Sum  2

## 2023-04-22 NOTE — Assessment & Plan Note (Addendum)
Janina Mayo dependent for many years, baseline O2 requirement is more recent and new as of discharge from Aug admission. 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

## 2023-04-22 NOTE — Assessment & Plan Note (Signed)
AKI on CKD 3a today Strict intake and output Monitor daily BMP with diuresis.

## 2023-04-22 NOTE — Progress Notes (Signed)
  Echocardiogram 2D Echocardiogram has been performed.  Kenneth [name] 04/22/2023, 4:24 PM

## 2023-04-22 NOTE — Assessment & Plan Note (Signed)
Resume home coreg with dose now (presumably missed evening dose) Tele monitor Add additional agents for rate control if needed High risk for stroke (h/o stroke previously), but eliquis is on hold due to hematuria with clots following TURBT last month. Unclear when / if he should restart eliquis. Ill continue to hold this for the moment though. May need to clarify this with urology

## 2023-04-22 NOTE — Assessment & Plan Note (Addendum)
Patient with elevated trop, very elevated BNP, CXR c/w pulmonary edema, worsening peripheral edema.  Suspect CHF. Also A.Fib RVR noted. CHF pathway Tele monitor May need further work up / cards consult depending on what it shows as likely cause of CHF: ICM vs NICM. Lasix 40mg  IV x1 in ED and 40mg  IV daily Strict intake and output Home med rec pending: continue aldactone if still on this med at home. 2d echo ordered.

## 2023-04-22 NOTE — Progress Notes (Signed)
Report called to Hospice RN.

## 2023-04-22 NOTE — H&P (Signed)
History and Physical    Patient: Kenneth [name]:[id number] DOB: [birth year] DOA: 04/02/2023 DOS: the patient was seen and examined on 04/22/2023 PCP: [name], MD  Patient coming from: Home  Chief Complaint:  Chief Complaint  Patient presents with   Shortness of Breath   HPI: Kenneth [name] is a 82 y.o. male with medical history significant of CAD s/p CABG, HLD, HTN, laryngeal CA s/p radiation + surgery, now tracheostomy dependent for many years, COPD.  Pt recently admitted to [facility] in Aug for TURBT + intra bladder chemo.  Admit complicated by new O2 requirement.  Hospitalist questioned if he simply had volume overload due to being post op from procedure vs worsening ILD.  Saw Pulm in office in follow up: they think pt has underlying COPD / chronic bronchitis that's more causing his new baseline O2 requirement.  Think ILD remains mild.  More recently pt admitted to [facility] in [place] earlier this month for worsening SOB.  Found to have B pulm effusions that required drainage, found to have PNA at that time it sounds like.  Discharged 9/18.  Since then, pt with gradual decline in respiratory status.  Increasing SOB, worsening peripheral edema.  Pt denies fevers, CP, abd pain.  Does have severe pain on urination that has been ongoing and essentially unchanged since time he had the TURBT in Aug.  Takes Lasix on PRN basis only.  Previously on eliquis for A.Fib + h/o CVA; however, this has been on hold for over the past month due to hematuria with clots associated with the bladder CA / TURBT.   Review of Systems: As mentioned in the history of present illness. All other systems reviewed and are negative. Past Medical History:  Diagnosis Date   Acute ST elevation myocardial infarction (STEMI) of inferior wall (HCC) 03/01/2020   [facility] [place]   Anxiety    Arthritis    Back (05/04/2018)   Carotid artery disease (HCC)    a. 04/2018 s/p R carotid stenting. b.  L TCAR 07/2022.   Chronic lower back pain    "have it at night" (05/04/2018)   CKD (chronic kidney disease), stage III (HCC)    Coronary artery disease    CVA (cerebral vascular accident) (HCC)    Depression    GERD (gastroesophageal reflux disease)    High triglycerides    History of kidney stones    Hyperlipidemia LDL goal <70    Hypertension    Hypertensive crisis 08/08/2022   Laryngeal carcinoma (HCC) 1998   PAF (paroxysmal atrial fibrillation) (HCC)    Peripheral vascular disease (HCC)    Pneumonia 11/11/2021   RBBB    Sinus bradycardia    Stroke [facility])    "he's had several little strokes; many that he wasn't aware of" (05/04/2018)   TIA (transient ischemic attack)    Tobacco abuse    Tracheal stenosis    Tracheostomy in place [facility])    Past Surgical History:  Procedure Laterality Date   CAROTID STENT Right 06-13-12; 05/04/2018   CAROTID STENT INSERTION N/A 06/13/2012   Procedure: CAROTID STENT INSERTION;  Surgeon: Nada Libman, MD;  Location: [facility] CATH LAB;  Service: Cardiovascular;  Laterality: N/A;   CATARACT EXTRACTION, BILATERAL Bilateral    COLONOSCOPY  11/10/2011   Dr. Jena Gauss: hemorrhoids, tubular adenoma   CORNEAL TRANSPLANT Bilateral    CORONARY ARTERY BYPASS GRAFT  ~ 2003   "CABG X3"   CORONARY BALLOON ANGIOPLASTY N/A 11/26/2020   Procedure:  CORONARY BALLOON ANGIOPLASTY;  Surgeon: Lennette Bihari, MD;  Location: [facility] INVASIVE CV LAB;  Service: Cardiovascular;  Laterality: N/A;   CORONARY STENT INTERVENTION N/A 06/05/2020   Procedure: CORONARY STENT INTERVENTION;  Surgeon: Runell Gess, MD;  Location: [facility] INVASIVE CV LAB;  Service: Cardiovascular;  Laterality: N/A;   EYE SURGERY     INSERTION OF RETROGRADE CAROTID STENT Right 05/04/2018   Procedure: INSERTION OF RIGHT  CAROTID STENT using an ABBOT- XACT carotid stent system;  Surgeon: Nada Libman, MD;  Location: [facility] OR;  Service: Vascular;  Laterality: Right;   LAPAROSCOPIC CHOLECYSTECTOMY  2007   LEFT HEART  CATH AND CORS/GRAFTS ANGIOGRAPHY N/A 10/07/2016   Procedure: Left Heart Cath and Cors/Grafts Angiography;  Surgeon: Lennette Bihari, MD;  Location: [facility] INVASIVE CV LAB;  Service: Cardiovascular;  Laterality: N/A;   LEFT HEART CATH AND CORS/GRAFTS ANGIOGRAPHY N/A 06/05/2020   Procedure: LEFT HEART CATH AND CORS/GRAFTS ANGIOGRAPHY;  Surgeon: Runell Gess, MD;  Location: [facility] INVASIVE CV LAB;  Service: Cardiovascular;  Laterality: N/A;   LEFT HEART CATH AND CORS/GRAFTS ANGIOGRAPHY N/A 11/26/2020   Procedure: LEFT HEART CATH AND CORS/GRAFTS ANGIOGRAPHY;  Surgeon: Lennette Bihari, MD;  Location: [facility] INVASIVE CV LAB;  Service: Cardiovascular;  Laterality: N/A;   PARTIAL LARYNGECTOMY  1998   TRACHEOSTOMY  03/13/2018   "@ [facility]"   TRANSCAROTID ARTERY REVASCULARIZATION  Left 08/17/2022   Procedure: Transcarotid Artery Revascularization;  Surgeon: Victorino Sparrow, MD;  Location: [facility] OR;  Service: Vascular;  Laterality: Left;   TRANSURETHRAL RESECTION OF BLADDER TUMOR N/A 03/02/2023   Procedure: TRANSURETHRAL RESECTION OF BLADDER TUMOR (TURBT)- with gemcitabine instillation;  Surgeon: [name], MD;  Location: [facility] ORS;  Service: Urology;  Laterality: N/A;   Social History:  reports that he quit smoking about 52 years ago. His smoking use included cigarettes. He started smoking about 70 years ago. He has a 17.3 pack-year smoking history. He has never been exposed to tobacco smoke. His smokeless tobacco use includes chew. He reports that he does not drink alcohol and does not use drugs.  Allergies  Allergen Reactions   Nifedical Xl [Nifedipine] Other (See Comments)    Unknown reaction   Ativan [Lorazepam] Other (See Comments)    Altered mental state   Penicillins Hives    Has patient had a PCN reaction causing immediate rash, facial/tongue/throat swelling, SOB or lightheadedness with hypotension: Yes Has patient had a PCN reaction causing severe rash involving mucus membranes or skin necrosis:  No Has patient had a PCN reaction that required hospitalization No Has patient had a PCN reaction occurring within the last 10 years: No If all of the above answers are "NO", then may proceed with Cephalosporin use.  HAS TOLERATED: cephalexin   Sulfacetamide Other (See Comments)    Unknown reaction   Sulfonamide Derivatives Other (See Comments)    Unknown reaction   Crestor [Rosuvastatin] Itching, Swelling and Rash   Erythromycin Rash   Firvanq [Vancomycin] Itching   Statins Itching, Swelling and Rash    Rash on his trunk and arms, swelling in his lips    Family History  Problem Relation Age of Onset   Dementia Mother    Heart disease Father    Cancer Brother    Heart disease Brother    Hyperlipidemia Brother    Cancer Brother    Colon cancer Neg Hx     Prior to Admission medications   Medication Sig Start Date End Date Taking? Authorizing Provider  acetaminophen (TYLENOL) 500 MG tablet Take 500 mg by mouth every 6 (six) hours as needed for headache, fever, moderate pain or mild pain.    [provider]  apixaban (ELIQUIS) 2.5 MG TABS tablet Take 1 tablet (2.5 mg total) by mouth 2 (two) times daily. Patient not taking: Reported on 03/30/2023 10/01/22   Antoine Poche, MD  budesonide (PULMICORT) 0.5 MG/2ML nebulizer solution Take 2 mLs (0.5 mg total) by nebulization 2 (two) times daily. 03/09/23   Coralyn Helling, MD  carvedilol (COREG) 12.5 MG tablet Take 1.5 tablets (18.75 mg total) by mouth 2 (two) times daily. 09/16/22 09/11/23  Laurann Montana, PA-C  clopidogrel (PLAVIX) 75 MG tablet Take 1 tablet (75 mg total) by mouth daily. Patient not taking: Reported on 03/30/2023 10/04/22   Laurann Montana, PA-C  docusate sodium (COLACE) 100 MG capsule Take 1 capsule (100 mg total) by mouth 2 (two) times daily. 03/04/23   Rolly Salter, MD  Evolocumab (REPATHA SURECLICK) 140 MG/ML SOAJ Inject 140 mg into the skin every 14 (fourteen) days. 09/15/22   Antoine Poche, MD  ezetimibe  (ZETIA) 10 MG tablet Take 1 tablet by mouth once daily 03/07/23   Antoine Poche, MD  furosemide (LASIX) 20 MG tablet Take 20 mg daily as needed for swelling 03/30/23   Antoine Poche, MD  guaiFENesin (MUCINEX) 600 MG 12 hr tablet Take 2 tablets (1,200 mg total) by mouth 2 (two) times daily as needed. 03/09/23   Coralyn Helling, MD  HYDROcodone-acetaminophen (NORCO/VICODIN) 5-325 MG tablet Take 1 tablet by mouth every 6 (six) hours as needed for moderate pain or severe pain. 03/04/23   Rolly Salter, MD  hyoscyamine (LEVSIN SL) 0.125 MG SL tablet Place 1 tablet (0.125 mg total) under the tongue every 4 (four) hours as needed (bladder spasms). 03/04/23   Rolly Salter, MD  ipratropium (ATROVENT) 0.02 % nebulizer solution Take 2.5 mLs (0.5 mg total) by nebulization 4 (four) times daily. 03/09/23   Coralyn Helling, MD  levalbuterol [facility] HFA) 45 MCG/ACT inhaler Inhale 1 puff into the lungs every 4 (four) hours as needed for wheezing or shortness of breath. 03/04/23 03/03/24  Rolly Salter, MD  levalbuterol Pauline Aus) 0.63 MG/3ML nebulizer solution Take 3 mLs (0.63 mg total) by nebulization every 4 (four) hours as needed for wheezing or shortness of breath. 03/09/23   Coralyn Helling, MD  Melatonin 10 MG TABS Take 10 mg by mouth at bedtime.    [provider]  nitroGLYCERIN (NITROSTAT) 0.4 MG SL tablet Place 1 tablet (0.4 mg total) under the tongue every 5 (five) minutes as needed for chest pain. 01/31/17 05/28/23  Jodelle Gross, NP  pantoprazole (PROTONIX) 40 MG tablet Take 1 tablet by mouth once daily 07/28/21   Antoine Poche, MD  PARoxetine (PAXIL) 20 MG tablet Take 20 mg by mouth daily.    [provider]  polyethylene glycol (MIRALAX / GLYCOLAX) 17 g packet Take 17 g by mouth daily. 03/04/23   Rolly Salter, MD  spironolactone (ALDACTONE) 25 MG tablet Take 1 tablet (25 mg total) by mouth daily. 09/16/22 09/11/23  Laurann Montana, PA-C    Physical Exam: Vitals:   2023/05/11 2355 2023-05-11  2357 04/22/23 0000 04/22/23 0030  BP: 111/70  110/81 113/87  Pulse: 86  (!) 109 (!) 107  Resp: (!) 24  (!) 22 (!) 22  Temp:  97.6 F (36.4 C) 97.7 F (36.5 C)   TempSrc:  Oral  SpO2: 100%  100% 100%  Weight:      Height:       Constitutional: NAD, calm, comfortable (except when he urinates, then has severe pain and tachycardia gets up to 150).  Neck: Tracheostomy present Respiratory: Diffuse crackles Cardiovascular: Irr, Irr.  Tachycardic.  2+ pitting edema BLE Abdomen: no tenderness, no masses palpated. No hepatosplenomegaly. Bowel sounds positive.  Neurologic: CN 2-12 grossly intact. Sensation intact, DTR normal. Strength 5/5 in all 4.  Psychiatric: Normal judgment and insight. Alert and oriented x 3. Normal mood.   Data Reviewed:    Labs on Admission: I have personally reviewed following labs and imaging studies  CBC: Recent Labs  Lab 2023/04/29 2009  WBC 9.8  HGB 9.0*  HCT 29.8*  MCV 89.2  PLT 315   Basic Metabolic Panel: Recent Labs  Lab April 29, 2023 2009  NA 134*  K 3.7  CL 97*  CO2 24  GLUCOSE 163*  BUN 40*  CREATININE 2.26*  CALCIUM 8.6*   GFR: Estimated Creatinine Clearance: 25 mL/min (A) (by C-G formula based on SCr of 2.26 mg/dL (H)). Liver Function Tests: No results for input(s): "AST", "ALT", "ALKPHOS", "BILITOT", "PROT", "ALBUMIN" in the last 168 hours. No results for input(s): "LIPASE", "AMYLASE" in the last 168 hours. No results for input(s): "AMMONIA" in the last 168 hours. Coagulation Profile: No results for input(s): "INR", "PROTIME" in the last 168 hours. Cardiac Enzymes: No results for input(s): "CKTOTAL", "CKMB", "CKMBINDEX", "TROPONINI" in the last 168 hours. BNP (last 3 results) No results for input(s): "PROBNP" in the last 8760 hours. HbA1C: No results for input(s): "HGBA1C" in the last 72 hours. CBG: No results for input(s): "GLUCAP" in the last 168 hours. Lipid Profile: No results for input(s): "CHOL", "HDL", "LDLCALC", "TRIG",  "CHOLHDL", "LDLDIRECT" in the last 72 hours. Thyroid Function Tests: No results for input(s): "TSH", "T4TOTAL", "FREET4", "T3FREE", "THYROIDAB" in the last 72 hours. Anemia Panel: No results for input(s): "VITAMINB12", "FOLATE", "FERRITIN", "TIBC", "IRON", "RETICCTPCT" in the last 72 hours. Urine analysis:    Component Value Date/Time   COLORURINE YELLOW Apr 29, 2023 2257   APPEARANCEUR HAZY (A) Apr 29, 2023 2257   APPEARANCEUR Cloudy (A) 03/29/2023 1014   LABSPEC 1.017 2023/04/29 2257   PHURINE 5.0 29-Apr-2023 2257   GLUCOSEU NEGATIVE Apr 29, 2023 2257   GLUCOSEU neg 06/01/2010 1113   HGBUR LARGE (A) 29-Apr-2023 2257   BILIRUBINUR NEGATIVE 04/29/2023 2257   BILIRUBINUR Negative 03/29/2023 1014   KETONESUR NEGATIVE 04/29/2023 2257   PROTEINUR 30 (A) 04/29/2023 2257   NITRITE NEGATIVE 04-29-23 2257   LEUKOCYTESUR LARGE (A) 29-Apr-2023 2257    Radiological Exams on Admission: DG Chest Portable 1 View  Result Date: April 29, 2023 CLINICAL DATA:  Shortness of breath. EXAM: PORTABLE CHEST 1 VIEW COMPARISON:  03/03/2023. FINDINGS: The heart is enlarged and the mediastinal contour is stable. A tracheostomy tube terminates 3.5 cm above the carina. There is evidence of prior cardiothoracic surgery. Mildly increased interstitial prominence with scattered airspace opacities at the lung bases. No effusion or pneumothorax. A vascular stent is noted in the superior mediastinum on the right. IMPRESSION: Increased interstitial prominence bilaterally with airspace opacities at the lung bases. Electronically Signed   By: Thornell Sartorius M.D.   On: 2023-04-29 22:53    EKG: Independently reviewed.   Assessment and Plan: * New onset of congestive heart failure (HCC) Patient with elevated trop, very elevated BNP, CXR c/w pulmonary edema, worsening peripheral edema.  Suspect CHF. Also A.Fib RVR noted. CHF pathway Tele monitor May need further work  up / cards consult depending on what it shows as likely cause of  CHF: ICM vs NICM. Lasix 40mg  IV x1 in ED and 40mg  IV daily Strict intake and output Home med rec pending: continue aldactone if still on this med at home. 2d echo ordered.  UTI (urinary tract infection) Pt with severe dysuria / pain for past month following his TURBT admission in Aug at [facility].  This has been persistent despite catheters all being out. UA is suspicious for UTI with large LE, > 50 WBC and RBC. Dysuria from BCG should generally only last ~48h (we are now at well over a month out) Empiric cefepime for nosocomial UTI Check urine culture Morphine PRN pain given severity of symptoms. If dysuria persistent despite ABx treatment, needs urology consult.  Atrial fibrillation with RVR (HCC) Resume home coreg with dose now (presumably missed evening dose) Tele monitor Add additional agents for rate control if needed High risk for stroke (h/o stroke previously), but eliquis is on hold due to hematuria with clots following TURBT last month. Unclear when / if he should restart eliquis. Ill continue to hold this for the moment though. May need to clarify this with urology  Chronic kidney disease, stage 3a (HCC) AKI on CKD 3a today Strict intake and output Monitor daily BMP with diuresis.  CAD S/P percutaneous coronary angioplasty Trops elevated but flat today. Cont coreg Repatha Got ASA 325 x1 in ED, but will hold off on further for the moment. Med rec pending to find out if he's still on plavix or if that was also held along with the eliquis.  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  Essential hypertension Cont  coreg.      Advance Care Planning:   Code Status: Full Code  Consults: None  Family Communication: Son at bedside  Severity of Illness: The appropriate patient status for this patient is OBSERVATION. Observation status is judged to be reasonable and necessary in order to provide the required intensity of service to ensure the patient's safety. The patient's presenting symptoms, physical exam findings, and initial radiographic and laboratory data in the context of their medical condition is felt to place them at decreased risk for further clinical deterioration. Furthermore, it is anticipated that the patient will be medically stable for discharge from the hospital within 2 midnights of admission.   Author: Hillary Bow., DO 04/22/2023 1:25 AM  For on call review [email or web address].

## 2023-04-22 NOTE — Assessment & Plan Note (Addendum)
Pt with severe dysuria / pain for past month following his TURBT admission in Aug at [facility].  This has been persistent despite catheters all being out. UA is suspicious for UTI with large LE, > 50 WBC and RBC. Dysuria from BCG should generally only last ~48h (we are now at well over a month out) Empiric cefepime for nosocomial UTI Check urine culture Morphine PRN pain given severity of symptoms. If dysuria persistent despite ABx treatment, needs urology consult.

## 2023-04-22 NOTE — Consult Note (Addendum)
Cardiology Consultation   Patient ID: Kenneth [name] MRN: [id number]; DOB: Oct 16, 1940  Admit date: 25-Apr-2023 Date of Consult: 04/22/2023  PCP:  Kenneth Perches, MD   [facility] Providers Cardiologist:  Kenneth Rich, MD        Patient Profile:   Kenneth [name] is a 82 y.o. 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 Jomarie [name].  History of Present Illness:   He has complex cardiac history as above. His last cath was in 08/2021 (per scan @ [name]) due to acute inferior STEMI showing widely patent LIMA-diag-LAD bypass graft, no SVG grafts identified on 02/2020 and are presumed to be occluded, 99% mRCA stent stenosis with thrombus treated with PTCA/overlapping DES x3. Echo at that time showed EF 45-50%. He was previously committed to lifelong DAPT given prior CVA, carotid disease, CAD though plan has since evolved. He was admitted twice in 07/2022. The first was for hypertensive urgency, possibly erroneously not taking amlodipine or spironolactone, with admission complicated by new onset AF RVR. He was treated with diltiazem drip/Eliquis, transitioned to carvedilol at DC. He had been on ASA + Plavix following last PCI, which was changed to Plavix + Eliquis given new Afib. 2D echo 07/2022 EF 55-60%, mildly reduced RVSF, borderline  dilation of aortic root 38mm. Consideration of outpatient DCCV was recommended. BP meds were adjusted. He was then readmitted 1/21-1/25/24 with acute CVA with watershed etiology. He was found to have critical stenosis of the left ICA, with moderate-severe disease in the previous R ICA stent. He underwent left sided TCAR on 08/17/22. He was started on ASA in addition to the Plavix + Eliquis with DC summary indicating recommendation to continue triple therapy for at least 1 month. He was thankfully in NSR in follow-up 08/2022. He was last admitted 02/2023 with hematuria and underwent TURBT for ultimately diagnosed bladder cancer. Post [name] course was complicated by new O2 requirement treated with IV Lasix (possible HFpEF?) but also component of bronchiectasis and possible pulmonary fibrosis. CTA had shown emphysema and scarring. He saw Dr. Wyline [name] back in follow-up 9/4 and had reported trouble with recurrent hematuria so was back off Plavix and Eliquis. It was recommended to remain off Plavix and restart Eliquis in 4 days, with consideration of Watchman if bleeding issues recurred. He also had LE edema so amlodipine was changed to Lasix PRN.   He was reportedly hospitalized at [name] at the beginning of the month for worsening SOB, possibly managed for PNA and fluid in the lungs at that time (? Thoracentesis, no records available). Records not available. He was discharged 9/18. His son at bedside is not presently sure if he ever went back on Eliquis. He has required home O2. Apparently he's had progressive decline in respiratory status with increasing SOB, worsening peripheral edema, severe pain on urination, and severe back pain the last few days. He originally did not want  to go to the [name] then later agreed. Here he was noted to be in AF RVR. Labs notable for progressive AKI Cr 2.26 (Cr 1.5-1.9 last few months), progressive anemia with Hgb down to 9 (normal through 01/2023 then slow downtrend), BNP 1131 (higher  than prior), hsTroponin flat at 297->295. CXR with increased interstitial prominence bilaterally with airspace opacities at the lung bases. Being treated with IV Lasix. Also getting full dose ASA from IM team. He got morphine a short while ago and is finally comfortable albeit with some increased WOB. He is able to recognize his son and grandson at bedside.   Past Medical History:  Diagnosis Date   Acute ST elevation myocardial infarction (STEMI) of inferior wall (HCC) 03/01/2020   [facility] [place]   Anxiety    Arthritis    Back (05/04/2018)   Carotid artery disease (HCC)    a. 04/2018 s/p R carotid stenting. b. L TCAR 07/2022.   Chronic lower back pain    "have it at night" (05/04/2018)   CKD (chronic kidney disease), stage III (HCC)    Coronary artery disease    CVA (cerebral vascular accident) (HCC)    Depression    GERD (gastroesophageal reflux disease)    High triglycerides    History of kidney stones    Hyperlipidemia LDL goal <70    Hypertension    Hypertensive crisis 08/08/2022   Laryngeal carcinoma (HCC) 1998   PAF (paroxysmal atrial fibrillation) (HCC)    Peripheral vascular disease (HCC)    Pneumonia 11/11/2021   RBBB    Sinus bradycardia    Stroke [name])    "he's had several little strokes; many that he wasn't aware of" (05/04/2018)   TIA (transient ischemic attack)    Tobacco abuse    Tracheal stenosis    Tracheostomy in place [name])     Past Surgical History:  Procedure Laterality Date   CAROTID STENT Right 06-13-12; 05/04/2018   CAROTID STENT INSERTION N/A 06/13/2012   Procedure: CAROTID STENT INSERTION;  Surgeon: Kenneth Libman, MD;  Location: [name];  Service: Cardiovascular;  Laterality: N/A;   CATARACT EXTRACTION, BILATERAL Bilateral    COLONOSCOPY  11/10/2011   Dr. Jena [name]: hemorrhoids, tubular adenoma   CORNEAL TRANSPLANT Bilateral    CORONARY ARTERY BYPASS GRAFT  ~ 2003   "CABG X3"   CORONARY BALLOON ANGIOPLASTY N/A 11/26/2020    Procedure: CORONARY BALLOON ANGIOPLASTY;  Surgeon: Lennette Bihari, MD;  [name];  Service: Cardiovascular;  Laterality: N/A;   CORONARY STENT INTERVENTION N/A 06/05/2020   Procedure: CORONARY STENT INTERVENTION;  Surgeon: Runell Gess, MD;  Location: [name];  Service: Cardiovascular;  Laterality: N/A;   EYE SURGERY     INSERTION OF RETROGRADE CAROTID STENT Right 05/04/2018   Procedure: INSERTION OF RIGHT  CAROTID STENT using an ABBOT- XACT carotid stent system;  Surgeon: Kenneth Libman, MD;  Location: [facility];  Service: Vascular;  Laterality: Right;   LAPAROSCOPIC CHOLECYSTECTOMY  2007   LEFT HEART CATH AND CORS/GRAFTS ANGIOGRAPHY N/A 10/07/2016   Procedure: Left Heart Cath and Cors/Grafts Angiography;  Surgeon: Lennette Bihari, MD;  [facility]: [name];  Service: Cardiovascular;  Laterality: N/A;   LEFT HEART CATH AND CORS/GRAFTS ANGIOGRAPHY N/A 06/05/2020   Procedure: LEFT HEART CATH AND CORS/GRAFTS ANGIOGRAPHY;  Surgeon: Runell Gess, MD;  [facility]: [name];  Service: Cardiovascular;  Laterality: N/A;   LEFT HEART CATH AND CORS/GRAFTS ANGIOGRAPHY  N/A 11/26/2020   Procedure: LEFT HEART CATH AND CORS/GRAFTS ANGIOGRAPHY;  Surgeon: Lennette Bihari, MD;  Location: [name];  Service: Cardiovascular;  Laterality: N/A;   PARTIAL LARYNGECTOMY  1998   TRACHEOSTOMY  03/13/2018   "@ [facility]"   TRANSCAROTID ARTERY REVASCULARIZATION  Left 08/17/2022   Procedure: Transcarotid Artery Revascularization;  Surgeon: Victorino Sparrow, MD;  Location: [name];  Service: Vascular;  Laterality: Left;   TRANSURETHRAL RESECTION OF BLADDER TUMOR N/A 03/02/2023   Procedure: TRANSURETHRAL RESECTION OF BLADDER TUMOR (TURBT)- with gemcitabine instillation;  Surgeon: [name], MD;  Location: [facility];  Service: Urology;  Laterality: N/A;     Home Medications:  Prior to Admission medications   Medication Sig Start Date End Date Taking? Authorizing  Provider  acetaminophen (TYLENOL) 500 MG tablet Take 500 mg by mouth every 6 (six) hours as needed for headache, fever, moderate pain or mild pain.    [provider]  apixaban (ELIQUIS) 2.5 MG TABS tablet Take 1 tablet (2.5 mg total) by mouth 2 (two) times daily. Patient not taking: Reported on 03/30/2023 10/01/22   Antoine Poche, MD  budesonide (PULMICORT) 0.5 MG/2ML nebulizer solution Take 2 mLs (0.5 mg total) by nebulization 2 (two) times daily. 03/09/23   Coralyn Helling, MD  carvedilol (COREG) 12.5 MG tablet Take 1.5 tablets (18.75 mg total) by mouth 2 (two) times daily. 09/16/22 09/11/23  Laurann Montana, [name]-C  clopidogrel (PLAVIX) 75 MG tablet Take 1 tablet (75 mg total) by mouth daily. Patient not taking: Reported on 03/30/2023 10/04/22   Laurann Montana, [name]-C  docusate sodium (COLACE) 100 MG capsule Take 1 capsule (100 mg total) by mouth 2 (two) times daily. 03/04/23   Rolly Salter, MD  Evolocumab (REPATHA SURECLICK) 140 MG/ML SOAJ Inject 140 mg into the skin every 14 (fourteen) days. 09/15/22   Antoine Poche, MD  ezetimibe (ZETIA) 10 MG tablet Take 1 tablet by mouth once daily 03/07/23   Antoine Poche, MD  furosemide (LASIX) 20 MG tablet Take 20 mg daily as needed for swelling 03/30/23   Antoine Poche, MD  guaiFENesin (MUCINEX) 600 MG 12 hr tablet Take 2 tablets (1,200 mg total) by mouth 2 (two) times daily as needed. 03/09/23   Coralyn Helling, MD  HYDROcodone-acetaminophen (NORCO/VICODIN) 5-325 MG tablet Take 1 tablet by mouth every 6 (six) hours as needed for moderate pain or severe pain. 03/04/23   Rolly Salter, MD  hyoscyamine (LEVSIN SL) 0.125 MG SL tablet Place 1 tablet (0.125 mg total) under the tongue every 4 (four) hours as needed (bladder spasms). 03/04/23   Rolly Salter, MD  ipratropium (ATROVENT) 0.02 % nebulizer solution Take 2.5 mLs (0.5 mg total) by nebulization 4 (four) times daily. 03/09/23   Coralyn Helling, MD  levalbuterol [facility] HFA) 45 MCG/ACT inhaler Inhale 1  puff into the lungs every 4 (four) hours as needed for wheezing or shortness of breath. 03/04/23 03/03/24  Rolly Salter, MD  levalbuterol Pauline Aus) 0.63 MG/3ML nebulizer solution Take 3 mLs (0.63 mg total) by nebulization every 4 (four) hours as needed for wheezing or shortness of breath. 03/09/23   Coralyn Helling, MD  Melatonin 10 MG TABS Take 10 mg by mouth at bedtime.    [provider]  nitroGLYCERIN (NITROSTAT) 0.4 MG SL tablet Place 1 tablet (0.4 mg total) under the tongue every 5 (five) minutes as needed for chest pain. 01/31/17 05/28/23  Jodelle Gross, NP  pantoprazole (PROTONIX) 40  MG tablet Take 1 tablet by mouth once daily 07/28/21   Antoine Poche, MD  PARoxetine (PAXIL) 20 MG tablet Take 20 mg by mouth daily.    [provider]  polyethylene glycol (MIRALAX / GLYCOLAX) 17 g packet Take 17 g by mouth daily. 03/04/23   Rolly Salter, MD  spironolactone (ALDACTONE) 25 MG tablet Take 1 tablet (25 mg total) by mouth daily. 09/16/22 09/11/23  Laurann Montana, [name]-C    Inpatient Medications: Scheduled Meds:  aspirin EC  325 mg Oral Daily   carvedilol  18.75 mg Oral BID   furosemide  40 mg Intravenous Daily   Continuous Infusions:  ceFEPime (MAXIPIME) IV 1 g (04/22/23 0315)   PRN Meds: acetaminophen **OR** acetaminophen, morphine injection, ondansetron **OR** ondansetron (ZOFRAN) IV  Allergies:    Allergies  Allergen Reactions   Nifedical Xl [Nifedipine] Other (See Comments)    Unknown reaction   Ativan [Lorazepam] Other (See Comments)    Altered mental state   Penicillins Hives   Sulfacetamide Other (See Comments)    Unknown reaction   Sulfonamide Derivatives Other (See Comments)    Unknown reaction   Crestor [Rosuvastatin] Itching, Swelling and Rash   Erythromycin Rash   Firvanq [Vancomycin] Itching   Statins Itching, Swelling and Rash    Rash on his trunk and arms, swelling in his lips    Social History:   Social History   Socioeconomic History    Marital status: Married    Spouse name: Not on file   Number of children: Not on file   Years of education: Not on file   Highest education level: Not on file  Occupational History   Occupation: retired    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  Tobacco Use   Smoking status: Former    Current packs/day: 0.00    Average packs/day: 1 pack/day for 17.3 years (17.3 ttl pk-yrs)    Types: Cigarettes    Start date: 04/22/1953    Quit date: 07/26/1970    Years since quitting: 52.7    Passive exposure: Never   Smokeless tobacco: Current    Types: Chew  Vaping Use   Vaping status: Never Used  Substance and Sexual Activity   Alcohol use: Never    Alcohol/week: 0.0 standard drinks of alcohol   Drug use: Never   Sexual activity: Not Currently  Other Topics Concern   Not on file  Social History Narrative   Walks on the treadmill every other day at the [facility] for the last couple of weeks.         Social Determinants of Health   Financial Resource Strain: Not on file  Food Insecurity: No Food Insecurity (04/22/2023)   Hunger Vital Sign    Worried About Running Out of Food in the Last Year: Never true    Ran Out of Food in the Last Year: Never true  Transportation Needs: No Transportation Needs (04/22/2023)   PRAPARE - Administrator, Civil Service (Medical): No    Lack of Transportation (Non-Medical): No  Physical Activity: Not on file  Stress: Not on file  Social Connections: Not on file  Intimate Partner Violence: Not At Risk (04/22/2023)   Humiliation, Afraid, Rape, and Kick questionnaire    Fear of Current or Ex-Partner: No    Emotionally Abused: No    Physically Abused: No    Sexually Abused: No    Family History:   Family History  Problem Relation Age of Onset  Dementia Mother    Heart disease Father    Cancer Brother    Heart disease Brother    Hyperlipidemia Brother    Cancer Brother    Colon cancer Neg Hx      ROS:  Please see the history of present illness.   All  other ROS reviewed and negative.     Physical Exam/Data:   Vitals:   04/22/23 0730 04/22/23 0754 04/22/23 0902 04/22/23 0923  BP:   114/77   Pulse: 84 (!) 121 (!) 122 (!) 45  Resp: 18 20 (!) 35 (!) 37  Temp:   98 F (36.7 C)   TempSrc:   Oral   SpO2: 97% 97% 91% (!) 88%  Weight:      Height:        Intake/Output Summary (Last 24 hours) at 04/22/2023 1035 Last data filed at 04/22/2023 0907 Gross per 24 hour  Intake 100 ml  Output 375 ml  Net -275 ml      04/22/2023    4:25 AM 21-May-2023    8:03 PM 03/30/2023    1:39 PM  Last 3 Weights  Weight (lbs) 182 lb 15.7 oz 182 lb 15.7 oz 185 lb  Weight (kg) 83 kg 83 kg 83.915 kg     Body mass index is 29.53 kg/m.  General: Frail appearing WM with mild increase abdominal WOB Head: Normocephalic, atraumatic, sclera non-icteric, no xanthomas, nares are without discharge. Neck: Negative for carotid bruits. JVP difficult to assess. Lungs: Diminished BS throughout without clear wheezing, rales, rhonchi. Breathing is unlabored. Heart: Irregularly irregular, tachy, S1 S2 without murmurs, rubs, or gallops.  Abdomen: Soft, non-tender, non-distended with normoactive bowel sounds. No rebound/guarding. Extremities: No clubbing or cyanosis. No edema. Distal pedal pulses are 2+ and equal bilaterally. Neuro: Alert and oriented X 3. Moves all extremities spontaneously. Psych:  Responds to questions appropriately with a normal affect.   EKG:  The EKG was personally reviewed and demonstrates: AF RVR with occ PVCs, RBBB, nonspecific STTW changes  Telemetry:  Telemetry was personally reviewed and demonstrates:  AF RVR  Relevant CV Studies: 2d echo 08/09/22    1. Left ventricular ejection fraction, by estimation, is 55 to 60%. The  left ventricle has normal function. The left ventricle has no regional  wall motion abnormalities. Left ventricular diastolic function could not  be evaluated.   2. Right ventricular systolic function is mildly reduced.  The right  ventricular size is mildly enlarged.   3. The mitral valve is grossly normal. No evidence of mitral valve  regurgitation. No evidence of mitral stenosis.   4. The aortic valve was not well visualized. There is mild calcification  of the aortic valve. Aortic valve regurgitation is not visualized. No  aortic stenosis is present.   5. Aortic dilatation noted. There is borderline dilatation of the aortic  root, measuring 38 mm.   Comparison(s): Changes from prior study are noted. LVEF improved from  45-50% in 08/2021 tio 55-60% now.    Laboratory Data:  High Sensitivity Troponin:   Recent Labs  [name] May 21, 2023 2009 05-21-2023 2257  TROPONINIHS 297* 295*     Chemistry Recent Labs  [name] May 21, 2023 2009 04/22/23 0214  NA 134* 134*  K 3.7 3.6  CL 97* 96*  CO2 24 25  GLUCOSE 163* 119*  BUN 40* 39*  CREATININE 2.26* 2.21*  CALCIUM 8.6* 8.4*  GFRNONAA 28* 29*  ANIONGAP 13 13    No results for input(s): "PROT", "ALBUMIN", "AST", "ALT", "  ALKPHOS", "BILITOT" in the last 168 hours. Lipids No results for input(s): "CHOL", "TRIG", "HDL", "LABVLDL", "LDLCALC", "CHOLHDL" in the last 168 hours.  Hematology Recent Labs  [name] 05-07-2023 2009 04/22/23 0214  WBC 9.8 9.8  RBC 3.34* 3.35*  HGB 9.0* 9.3*  HCT 29.8* 29.9*  MCV 89.2 89.3  MCH 26.9 27.8  MCHC 30.2 31.1  RDW 15.9* 15.9*  PLT 315 292   Thyroid No results for input(s): "TSH", "FREET4" in the last 168 hours.  BNP Recent Labs  [name] 05-07-2023 2009  BNP 1,131.7*    DDimer No results for input(s): "DDIMER" in the last 168 hours.   Radiology/Studies:  DG Chest Portable 1 View  Result Date: May 07, 2023 CLINICAL DATA:  Shortness of breath. EXAM: PORTABLE CHEST 1 VIEW COMPARISON:  03/03/2023. FINDINGS: The heart is enlarged and the mediastinal contour is stable. A tracheostomy tube terminates 3.5 cm above the carina. There is evidence of prior cardiothoracic surgery. Mildly increased interstitial prominence with scattered  airspace opacities at the lung bases. No effusion or pneumothorax. A vascular stent is noted in the superior mediastinum on the right. IMPRESSION: Increased interstitial prominence bilaterally with airspace opacities at the lung bases. Electronically Signed   By: Thornell Sartorius M.D.   On: 2023-05-07 22:53     Assessment and Plan:   1. Multifactorial dyspnea, weakness, severe pain with urination and back pain  2. Acute on suspected chronic CHF - previous mild LV dysfunction in 2023 with improved LVEF in 07/2022, concern for recurrent LV dysfunction 3. Recurrent AF RVR 4. Chronic hypoxic respiratory failure in setting of laryngeal CA and tracheal stenosis with tracheostomy, pulmonary fibrosis, bronchiectasis 5. Hematuria and recent TURBT, UA + Hgb 6. H/o multiple CVAs, carotid disease with prior R carotid stenting, more recently TCAR 07/2022 7. CAD s/p CABG 2003 and numerous PCIs since then  8. Elevated troponin suspected due to demand ischemia in setting of AF RVR, do not anticipate ischemic evaluation 9. AKI superimposed on CKD 3 10. Slowly worsening anemia  Patient is extremely complex with multiple organ system involvement. I am concerned about the overall clinical picture. I preliminarily discussed with Dr. Royann Shivers. We agree with echo, IV Lasix, and trial of diltiazem drip as ordered. I'm worried based on presentation that his EF has dropped. He remains on oral carvedilol. We can consider switching this to metoprolol if we run into issues with blood pressure. We may need to consider IV amiodarone if his rhythm proves difficult to rate control. Though not an optimal candidate given his lung disease, risk may outweigh long term benefit given multiple comorbidities. There is risk of cardioversion without anticoagulation on board but he has apparently proven he cannot tolerate blood thinners at this time with ongoing hematuria issues. This is superimposed on background of other medical issues including  generalized failure to thrive over the last few admissions complicated by bladder tumor, hematuria, and severe pain with urination and back pain. Discussed with Dr. Jomarie [name]. She plans to start with bladder scan to evaluate for possible bladder outlet obstruction. We also discussed full dose ASA - Dr. Royann Shivers would not see specific benefit from this,may have been intended for one time dose, will hold off perpetual dosing given ongoing concerns for hematuria in UA.    See MD attestation for additional comments.   Risk Assessment/Risk Scores:        New York Heart Association (NYHA) Functional Class NYHA Class IV  CHA2DS2-VASc Score = 6   This indicates a 9.7% annual  risk of stroke. The patient's score is based upon: CHF History: 0 HTN History: 1 Diabetes History: 0 Stroke History: 2 Vascular Disease History: 1 Age Score: 2 Gender Score: 0         For questions or updates, please contact  [facility] Please consult [email or web address] for contact info under    Signed, Laurann Montana, [name]-C  04/22/2023 10:35 AM   I have seen and examined the patient along with Laurann Montana, [name]-C , PA NP.  I have reviewed the chart, notes and new data.  I agree with [name]/NP's note.  Key new complaints: His biggest complaints by far related to difficult and painful urination.  He has been eliminating clots in his urine.  After receiving analgesics, he is much more relaxed and his ventricular rate has decreased.  He does not complain of shortness of breath at rest.  He has not had angina.  He Key examination changes: Irregular rhythm, prolonged expiration without overt wheezing or rales, slightly tender in the suprapubic area Key new findings / data: Creatinine mildly worse than baseline, mild-moderate normocytic anemia, gross hematuria with negative nitrites.  ECG shows atrial fibrillation rapid ventricular response, old RBBB, without repolarization abnormalities.  Repeat echocardiogram  pending.  PLAN: Heart failure exacerbation appears to be related to atrial fibrillation rapid ventricular response.  Unclear how long this has been going on. Seems to be improving now with better pain control and with intravenous diltiazem.  Blood pressure is a little low, but not up problem so far. If he becomes more hypotensive, can switch to amiodarone instead of diltiazem. Not a good cardioversion candidate. All anticoagulation and antiplatelet agents are on hold, but he continues to have hematuria.  I wonder if his distress is primary related to bladder outlet obstruction from clots. So far we have not been successful at getting a proper bedside bladder ultrasound.  May benefit from full renal ultrasound to make sure he is not experiencing obstruction and hydronephrosis.  Thurmon Fair, MD, [name] [phone] 04/22/2023, 1:17 PM

## 2023-04-22 NOTE — Progress Notes (Addendum)
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 -Hospitalized in 8/24 with hematuria, bladder CA, underwent TURBT and intra bladder chemotherapy, continues to have persistent hematuria, taken off Eliquis on account of this -Recently hospitalized at [facility] in [place] for worsening shortness of breath, treated for pleural effusions and pneumonia, underwent thoracentesis, and discharged, -In the ED he was noted to be hypoxic, A-fib RVR, placed on 8 L nasal cannula, BNP 1131, troponin 297, creatinine 2.2, chest x-ray with increased interstitial prominence   Subjective: -Has some shortness of breath  Assessment and Plan:  Acute CHF, new diagnosis  -Presenting with some volume overload and A-fib RVR in the background of chronic lung disease, COPD and mild ILD -Last CT chest 7/24 with mild interstitial disease and emphysema -Also report of some orthopnea and lower extremity edema -Continue IV Lasix today, follow-up echocardiogram -Will request cardiology input -GDMT limited by CKD, avoid SGLT2i with frequent UTIs, bladder CA  Hematuria, abnormal urinalysis -Likely secondary to known bladder CA and recent TURBT -UA is abnormal, no clear symptoms/very difficult to communicate, he is afebrile will check procalcitonin, discontinue antibiotics  Atrial fibrillation with RVR (HCC) -Continue Coreg -Add Cardizem gtt. today if heart rate remains persistently elevated, hopefully EF is okay -Poor candidate for amiodarone with ILD, chronic lung disease -Off anticoagulation with ongoing hematuria  Chronic kidney disease,  stage 3a (HCC) -Relatively stable, monitor  CAD S/P percutaneous coronary angioplasty Trops elevated but flat today. -Likely from demand ischemia, continue Coreg, aspirin  Chronic hypoxic respiratory failure (HCC) Trach dependent for many years, baseline O2 requirement is more recent and new as of discharge from Aug admission. -Likely multifactorial, combination of CHF, COPD and mild ILD -add xopenex, dulera  H/o Multiple CVAs -History of critical ICA stenosis and left TCAR in 1/24 -Continue aspirin, no longer on Plavix and Eliquis likely secondary to  Tracheostomy dependent (HCC) Chronic trach dependence for many years now due to tracheal stenosis from prior treatment for laryngeal CA.  Essential hypertension Cont coreg.  DVT prophylaxis: SCDs Code Status: DNR, discussed with Spouse and PoA [name] Family Communication: Discussed with son at bedside and updated spouse Disposition Plan: Likely home in 2 to 3 days  Consultants:    Procedures:   Antimicrobials:    Objective: Vitals:   04/22/23 0754 04/22/23 0902 04/22/23 0923 04/22/23 1000  BP:  114/77    Pulse: (!) 121 (!) 122 (!) 45 (!) 114  Resp: 20 (!) 35 (!) 37 (!) 33  Temp:  98 F (36.7 C)    TempSrc:  Oral    SpO2: 97% 91% (!) 88% 91%  Weight:      Height:        Intake/Output Summary (Last 24 hours) at 04/22/2023 1110 Last data filed at 04/22/2023 4401 Gross per 24 hour  Intake 100 ml  Output 375 ml  Net -275 ml   Filed Weights   04/24/2023 2003 04/22/23 0425  Weight: 83 kg 83 kg    Examination:  General exam: Chronically ill elderly male sitting up in bed, AAO x 2 HEENT: Positive JVD, tracheostomy with trach collar CVS: S1-S2, irregular rhythm Lungs: Few basilar rales, poor air  movement bilaterally Abdomen: Soft, nontender, bowel sounds present Extremities: 1+ edema Psychiatry: Flat affect    Data Reviewed:   CBC: Recent Labs  Lab 05-12-2023 2009 04/22/23 0214  WBC 9.8 9.8  HGB 9.0*  9.3*  HCT 29.8* 29.9*  MCV 89.2 89.3  PLT 315 292   Basic Metabolic Panel: Recent Labs  Lab 05/12/2023 2009 04/22/23 0214  NA 134* 134*  K 3.7 3.6  CL 97* 96*  CO2 24 25  GLUCOSE 163* 119*  BUN 40* 39*  CREATININE 2.26* 2.21*  CALCIUM 8.6* 8.4*   GFR: Estimated Creatinine Clearance: 25.6 mL/min (A) (by C-G formula based on SCr of 2.21 mg/dL (H)). Liver Function Tests: No results for input(s): "AST", "ALT", "ALKPHOS", "BILITOT", "PROT", "ALBUMIN" in the last 168 hours. No results for input(s): "LIPASE", "AMYLASE" in the last 168 hours. No results for input(s): "AMMONIA" in the last 168 hours. Coagulation Profile: No results for input(s): "INR", "PROTIME" in the last 168 hours. Cardiac Enzymes: No results for input(s): "CKTOTAL", "CKMB", "CKMBINDEX", "TROPONINI" in the last 168 hours. BNP (last 3 results) No results for input(s): "PROBNP" in the last 8760 hours. HbA1C: No results for input(s): "HGBA1C" in the last 72 hours. CBG: No results for input(s): "GLUCAP" in the last 168 hours. Lipid Profile: No results for input(s): "CHOL", "HDL", "LDLCALC", "TRIG", "CHOLHDL", "LDLDIRECT" in the last 72 hours. Thyroid Function Tests: No results for input(s): "TSH", "T4TOTAL", "FREET4", "T3FREE", "THYROIDAB" in the last 72 hours. Anemia Panel: No results for input(s): "VITAMINB12", "FOLATE", "FERRITIN", "TIBC", "IRON", "RETICCTPCT" in the last 72 hours. Urine analysis:    Component Value Date/Time   COLORURINE YELLOW 12-May-2023 2257   APPEARANCEUR HAZY (A) May 12, 2023 2257   APPEARANCEUR Cloudy (A) 03/29/2023 1014   LABSPEC 1.017 12-May-2023 2257   PHURINE 5.0 2023-05-12 2257   GLUCOSEU NEGATIVE 05/12/2023 2257   GLUCOSEU neg 06/01/2010 1113   HGBUR LARGE (A) 2023-05-12 2257   BILIRUBINUR NEGATIVE 2023/05/12 2257   BILIRUBINUR Negative 03/29/2023 1014   KETONESUR NEGATIVE 05/12/2023 2257   PROTEINUR 30 (A) 12-May-2023 2257   NITRITE NEGATIVE 2023/05/12 2257   LEUKOCYTESUR  LARGE (A) May 12, 2023 2257   Sepsis Labs: @LABRCNTIP (procalcitonin:4,lacticidven:4)  )No results found for this or any previous visit (from the past 240 hour(s)).   Radiology Studies: DG Chest Portable 1 View  Result Date: 2023-05-12 CLINICAL DATA:  Shortness of breath. EXAM: PORTABLE CHEST 1 VIEW COMPARISON:  03/03/2023. FINDINGS: The heart is enlarged and the mediastinal contour is stable. A tracheostomy tube terminates 3.5 cm above the carina. There is evidence of prior cardiothoracic surgery. Mildly increased interstitial prominence with scattered airspace opacities at the lung bases. No effusion or pneumothorax. A vascular stent is noted in the superior mediastinum on the right. IMPRESSION: Increased interstitial prominence bilaterally with airspace opacities at the lung bases. Electronically Signed   By: Thornell Sartorius M.D.   On: May 12, 2023 22:53     Scheduled Meds:  aspirin EC  325 mg Oral Daily   carvedilol  18.75 mg Oral BID   furosemide  40 mg Intravenous Daily   Continuous Infusions:  ceFEPime (MAXIPIME) IV 1 g (04/22/23 0315)     LOS: 0 days    Time spent:    Zannie Cove, MD [facility]   04/22/2023, 11:10 AM

## 2023-04-22 NOTE — Assessment & Plan Note (Signed)
Trops elevated but flat today. Cont coreg Repatha Got ASA 325 x1 in ED, but will hold off on further for the moment. Med rec pending to find out if he's still on plavix or if that was also held along with the eliquis.

## 2023-04-22 NOTE — Plan of Care (Signed)
  Problem: Clinical Measurements: Goal: Will remain free from infection Outcome: Progressing   Problem: Safety: Goal: Ability to remain free from injury will improve Outcome: Progressing   Problem: Skin Integrity: Goal: Risk for impaired skin integrity will decrease Outcome: Progressing   Problem: Clinical Measurements: Goal: Respiratory complications will improve Outcome: Not Progressing Note: 9 liters trach collar.   Problem: Activity: Goal: Risk for activity intolerance will decrease Outcome: Not Progressing Note: Afib with RVR and tachypnea.   Problem: Nutrition: Goal: Adequate nutrition will be maintained Outcome: Not Progressing Note: Did not eat breakfast or lunch.   Problem: Coping: Goal: Level of anxiety will decrease Outcome: Not Progressing Note: Mild anxiety.

## 2023-04-22 NOTE — Progress Notes (Addendum)
Pharmacy Antibiotic Note  Kenneth [name] is a 82 y.o. male admitted on 2023/05/15 with gradually worsening respiratory status.  Pharmacy has been consulted for cefepime dosing for UTI. CXR shows interstitial prominence bilaterally with airspace opacities at the lung base. PCN allergy, but has tolerated cephalosporins in the past  -Urinalysis shows large leukocytes, WBC >50 and RBC >50, WBC 9.8, sCr 2.26, afebrile, UCx pending  Plan: -Cefepime 1g IV every 24 hours -Monitor renal function -Follow up signs of clinical improvement, LOT, de-escalation of antibiotics  Height: 5\' 6"  (167.6 cm) Weight: 83 kg (182 lb 15.7 oz) IBW/kg (Calculated) : 63.8  Temp (24hrs), Avg:97.9 F (36.6 C), Min:97.6 F (36.4 C), Max:98.5 F (36.9 C)  Recent Labs  Lab May 15, 2023 2009  WBC 9.8  CREATININE 2.26*    Estimated Creatinine Clearance: 25 mL/min (A) (by C-G formula based on SCr of 2.26 mg/dL (H)).    Allergies  Allergen Reactions   Nifedical Xl [Nifedipine] Other (See Comments)    Unknown reaction   Ativan [Lorazepam] Other (See Comments)    Altered mental state   Penicillins Hives    Has patient had a PCN reaction causing immediate rash, facial/tongue/throat swelling, SOB or lightheadedness with hypotension: Yes Has patient had a PCN reaction causing severe rash involving mucus membranes or skin necrosis: No Has patient had a PCN reaction that required hospitalization No Has patient had a PCN reaction occurring within the last 10 years: No If all of the above answers are "NO", then may proceed with Cephalosporin use.  HAS TOLERATED: cephalexin   Sulfacetamide Other (See Comments)    Unknown reaction   Sulfonamide Derivatives Other (See Comments)    Unknown reaction   Crestor [Rosuvastatin] Itching, Swelling and Rash   Erythromycin Rash   Firvanq [Vancomycin] Itching   Statins Itching, Swelling and Rash    Rash on his trunk and arms, swelling in his lips    Antimicrobials this  admission: Cefepime 9/27 >>   Microbiology results: 05-15-23 UCx:    Thank you for allowing pharmacy to be a part of this patient's care.  Arabella Merles, PharmD. Clinical Pharmacist 04/22/2023 1:32 AM

## 2023-04-22 NOTE — Progress Notes (Signed)
Large incontinent urine.  Bladder scan 44 ml.

## 2023-04-22 NOTE — Progress Notes (Signed)
Son suctioned trach with no gloves.  He said he used alcohol hand sanitizer.  Son said this is the way the trach is suctioned at home.

## 2023-04-22 NOTE — Progress Notes (Signed)
   04/22/23 1000  Assess: MEWS Score  Pulse Rate (!) 114  ECG Heart Rate (!) 126  Resp (!) 33  SpO2 91 %  O2 Device Tracheostomy Collar  O2 Flow Rate (L/min) 9 L/min  Assess: MEWS Score  MEWS Temp 0  MEWS Systolic 0  MEWS Pulse 2  MEWS RR 2  MEWS LOC 0  MEWS Score 4  MEWS Score Color Red  Assess: if the MEWS score is Yellow or Red  Were vital signs accurate and taken at a resting state? Yes  Does the patient meet 2 or more of the SIRS criteria? No  MEWS guidelines implemented  No, previously yellow, continue vital signs every 4 hours  Notify: Charge Nurse/RN  Name of Charge Nurse/RN Notified Darl Pikes, RN  Provider Notification  Provider Name/Title Jomarie Longs, MD  Date Provider Notified 04/22/23  Time Provider Notified 1000  Method of Notification Face-to-face  Notification Reason Other (Comment) (Red mews)  Date of Provider Response 04/22/23  Time of Provider Response 1000  Assess: SIRS CRITERIA  SIRS Temperature  0  SIRS Pulse 1  SIRS Respirations  1  SIRS WBC 0  SIRS Score Sum  2

## 2023-04-22 NOTE — Assessment & Plan Note (Addendum)
Chronic trach dependence for many years now due to glottic scaring from prior treatment for laryngeal CA.

## 2023-04-22 NOTE — Consult Note (Signed)
[facility]  Name: Kenneth [name] DOB: 06/14/1941 MRN: [id number] Date of admit: 04/25/2023 Date of consult: 04/22/2023 Referring provider: Dr. John Giovanni CC: Dyspnea  History of present illness: 82 yo male was recently in [facility] in [place] with CHF, cardiac arrest, hematuria, and pneumonia.  He was discharged home and became progressively worse.  He was brought to [facility].  Found to have A fib with RVR and worsening hypoxia.  He has chronic trach after laryngeal cancer and tracheal stenosis.  He was seen by cardiology.  In the evening of 04/22/23 his breathing became significantly worse.  He was pale and  diaphoretic.  He had abdominal breathing pattern with use of accessory muscles.  Rapid response called and he was assessed by hospitalist.  Chest xray showed worsening pulmonary edema.  He was given additional lasix without response.  He was noted to have hematuria.  Bladder scanning didn't show any retained urine.  PCCM consulted to assist with respiratory management.  Past medical history: He  has a past medical history of Acute ST elevation myocardial infarction (STEMI) of inferior wall (HCC) (03/01/2020), Anxiety, Arthritis, Bladder cancer (HCC), Carotid artery disease (HCC), Chronic lower back pain, CKD (chronic kidney disease), stage III (HCC), Coronary artery disease, CVA (cerebral vascular accident) (HCC), Depression, GERD (gastroesophageal reflux disease), High triglycerides, History of kidney stones, Hyperlipidemia LDL goal <70, Hypertension, Hypertensive crisis (08/08/2022), Laryngeal carcinoma (HCC) (1998), PAF (paroxysmal atrial fibrillation) (HCC), Peripheral vascular disease (HCC), Pneumonia (11/11/2021), RBBB, Sinus bradycardia, Stroke (HCC), TIA (transient ischemic attack), Tobacco abuse, Tracheal stenosis, and Tracheostomy in place [facility]).  Past surgical history: He  has a past surgical history that includes Partial laryngectomy (1998);  Colonoscopy (11/10/2011); Carotid stent (Right, 06-13-12; 05/04/2018); carotid stent insertion (N/A, 06/13/2012); LEFT HEART CATH AND CORS/GRAFTS ANGIOGRAPHY (N/A, 10/07/2016); Eye surgery; Corneal transplant (Bilateral); Cataract extraction, bilateral (Bilateral); Laparoscopic cholecystectomy (2007); Tracheostomy (03/13/2018); Coronary artery bypass graft (~ 2003); Insertion of retrograde carotid stent (Right, 05/04/2018); LEFT HEART CATH AND CORS/GRAFTS ANGIOGRAPHY (N/A, 06/05/2020); CORONARY STENT INTERVENTION (N/A, 06/05/2020); LEFT HEART CATH AND CORS/GRAFTS ANGIOGRAPHY (N/A, 11/26/2020); CORONARY BALLOON ANGIOPLASTY (N/A, 11/26/2020); Transcarotid artery revascularization (Left, 08/17/2022); and Transurethral resection of bladder tumor (N/A, 03/02/2023).  Family history: His family history includes Cancer in his brother and brother; Dementia in his mother; Heart disease in his brother and father; Hyperlipidemia in his brother.  Social history:  He  reports that he quit smoking about 52 years ago. His smoking use included cigarettes. He started smoking about 70 years ago. He has a 17.3 pack-year smoking history. He has never been exposed to tobacco smoke. His smokeless tobacco use includes chew. He reports that he does not drink alcohol and does not use drugs.  ROS: Unable to obtain  Allergies  Allergen Reactions   Nifedical Xl [Nifedipine] Other (See Comments)    Unknown reaction   Ativan [Lorazepam] Other (See Comments)    Altered mental state   Penicillins Hives   Sulfacetamide Other (See Comments)    Unknown reaction   Sulfonamide Derivatives Other (See Comments)    Unknown reaction   Crestor [Rosuvastatin] Itching, Swelling and Rash   Erythromycin Rash   Firvanq [Vancomycin] Itching   Statins Itching, Swelling and Rash    Rash on his trunk and arms, swelling in his lips   Vital signs: BP (!) 153/105   Pulse 85   Temp 98.1 F (36.7 C) (Oral)   Resp (!) 35   Ht 5\' 6"  (1.676 m)  Wt  83 kg   SpO2 100%   BMI 29.53 kg/m   Physical exam:  General - in respiratory distress with accessory muscle use, pale, diaphoretic Eyes - pupils reactive ENT - tracheostomy in place, poor hearing acuity Cardiac - irregular, tachycardic Chest - bilateral crackles Abdomen - paradoxical breathing pattern, decreased bowel sounds Extremities - clammy Neuro - not responding to verbal stimulation  Labs:    Latest Ref Rng & Units 04/22/2023    2:14 AM 2023-05-04    8:09 PM 03/04/2023    4:18 AM  CMP  Glucose 70 - 99 mg/dL 213  086  578   BUN 8 - 23 mg/dL 39  40  38   Creatinine 0.61 - 1.24 mg/dL 4.69  6.29  5.28   Sodium 135 - 145 mmol/L 134  134  136   Potassium 3.5 - 5.1 mmol/L 3.6  3.7  4.1   Chloride 98 - 111 mmol/L 96  97  99   CO2 22 - 32 mmol/L 25  24  24    Calcium 8.9 - 10.3 mg/dL 8.4  8.6  8.4       Latest Ref Rng & Units 04/22/2023    2:14 AM 05-04-2023    8:09 PM 03/04/2023    4:18 AM  CBC  WBC 4.0 - 10.5 K/uL 9.8  9.8  13.9   Hemoglobin 13.0 - 17.0 g/dL 9.3  9.0  41.3   Hematocrit 39.0 - 52.0 % 29.9  29.8  34.5   Platelets 150 - 400 K/uL 292  315  249    Cardiac Panel (last 3 results) Recent Labs    2023/05/04 2009 2023/05/04 2257  TROPONINIHS 297* 295*    Studies: CT angio chest 02/07/23 >> mild emphysema, BTX and scarring RML and RUL, coronary calcification with aortic atherosclerosis Echo 04/22/23 >> EF 35 to 40%, mod/severe MR  Discussion: Met with his wife and his son.  Explained his current status and severity of his acute on chronic illness.  They understand he has been getting progressively worse over the past several months and that he is "ready to be with the lord".  They do not want to see him suffering anymore.  They confirmed that he would not want to put on life support under any circumstance.  As this point most appropriate course of action is transition to comfort measures.  I anticipate he will pass away while in hospital.  Assessment: Acute  HFpEF NSTEMI Atrial fibrillation with RVR CAD s/p angioplasty Acute on chronic hypoxic respiratory failure Acute pulmonary edema COPD with emphysema CKD 3a Hx of CVA Tracheal stenosis s/p tracheostomy Hx of laryngeal cancer Hematuria with history of bladder cancer  Plan: - DNR, DNI - transition to comfort measures - start morphine infusion with prn ativan  D/w Dr. Loney Loh and Rapid Response team  Please call if further assistance is needed from PCCM.  Signature: Coralyn Helling, MD [facility] Pulmonary/Critical Care Pager - [phone] 04/22/2023, 9:01 PM

## 2023-04-22 NOTE — Progress Notes (Signed)
Informed by RN that patient has had progressively worsening dyspnea throughout the day and also increasing oxygen requirement.  He is receiving Lasix but not having much urine output.  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 Recently hospitalized at [facility] in [place] for worsening shortness of breath, treated for pleural effusions and pneumonia, underwent thoracentesis.  Currently being admitted for acute CHF, hematuria secondary to known bladder cancer and recent TURBT, and A-fib with RVR.  No longer on Eliquis due to persistent hematuria.   Chest x-ray done yesterday showing increased interstitial prominence bilaterally with airspace opacities at the lung bases.  BNP was 1131 yesterday. Echo done today showing EF 35 to 40%, moderate to severe MVR.  Cardiology has been consulted.  Patient is currently being treated with IV Lasix 40 mg twice daily, Cardizem drip, cefepime, coreg 18.75 mg twice daily, Brovana neb twice daily, and Xopenex neb every 6 hours.    Patient seen and examined at bedside.  Awake and alert, sitting up in the bed.  Significantly tachypneic with accessory muscle use.  In obvious respiratory distress.  Crackles and mild wheezing appreciated on auscultation of the lungs.  Mild pitting edema of bilateral lower extremities.  Temperature 98.1 F, heart rate 100-110s, blood pressure 150/94, satting in the mid to upper 90s on 12 L via trach collar, 100% FiO2.  Patient's family present at bedside (son and wife).  Confirmed CODE STATUS with family, he is DNR/DNI and would not want to be on a ventilator to support his breathing.  Stat chest x-ray done and showing diffuse bilateral airspace disease concerning  for pulmonary edema and small left pleural effusion.  Stat EKG done and showing A-fib with RVR, PVCs.  Patient was given IV Lasix 40 mg x 1 and IV morphine 1 mg given for dyspnea/air hunger.  Already on scheduled bronchodilator treatments.  Bladder scan ordered to make sure he is not obstructed given hematuria/blood clots. Discussed with cardiology (Dr. Orson Aloe), recommending stopping Cardizem drip given low EF on echo today.  Critical care also consulted and saw the patient and after discussion with family, patient was transitioned to full comfort care.

## 2023-04-22 NOTE — Assessment & Plan Note (Signed)
Cont coreg 

## 2023-04-22 NOTE — Progress Notes (Signed)
Cardizem drip and Cardizem bolus ordered.

## 2023-04-22 NOTE — Progress Notes (Signed)
   04/22/23 1700  Assess: MEWS Score  Temp 98.2 F (36.8 C)  BP 99/61  MAP (mmHg) 72  Pulse Rate 96  ECG Heart Rate 96  Resp (!) 25  Level of Consciousness Alert  SpO2 91 %  O2 Device Tracheostomy Collar  O2 Flow Rate (L/min) 9 L/min  Assess: MEWS Score  MEWS Temp 0  MEWS Systolic 1  MEWS Pulse 0  MEWS RR 1  MEWS LOC 0  MEWS Score 2  MEWS Score Color Yellow  Assess: if the MEWS score is Yellow or Red  Were vital signs accurate and taken at a resting state? Yes  Does the patient meet 2 or more of the SIRS criteria? No  MEWS guidelines implemented  Yes, yellow  Treat  MEWS Interventions Considered administering scheduled or prn medications/treatments as ordered  Take Vital Signs  Increase Vital Sign Frequency  Yellow: Q2hr x1, continue Q4hrs until patient remains green for 12hrs  Escalate  MEWS: Escalate Yellow: Discuss with charge nurse and consider notifying provider and/or RRT  Notify: Charge Nurse/RN  Name of Charge Nurse/RN Notified Darl Pikes, RN  Provider Notification  Provider Name/Title Dr. Jomarie Longs  Date Provider Notified 04/22/23  Time Provider Notified 1736  Notification Reason Other (Comment) (Yellow/red mews.  RR 22-35.  Lasix 40 mg IV due, BP 99/61.)  Assess: SIRS CRITERIA  SIRS Temperature  0  SIRS Pulse 1  SIRS Respirations  1  SIRS WBC 0  SIRS Score Sum  2

## 2023-04-22 NOTE — ED Notes (Signed)
ED TO INPATIENT HANDOFF REPORT  ED Nurse Name and Phone #:  Lyn Withem RN [phone]  S Name/Age/Gender Micki Riley 82 y.o. male Room/Bed: 019C/019C  Code Status   Code Status: Full Code  Home/SNF/Other Home Patient oriented to: self, place, time, and situation Is this baseline? Yes   Triage Complete: Triage complete  Chief Complaint New onset of congestive heart failure (HCC) [I50.9]  Triage Note Patient here with progressively worsening shortness of breath. Was hospitalized at [facility] at the beginning of the month with PNA and had fluid removed from his lungs approx 2 L per family. Patient chronically trached for 8-10 years however until hospitalization earlier in the month. Patient now requiring 4 L of O2. Tachypneic in triage, pale in color.    Allergies Allergies  Allergen Reactions   Nifedical Xl [Nifedipine] Other (See Comments)    Unknown reaction   Ativan [Lorazepam] Other (See Comments)    Altered mental state   Penicillins Hives    Has patient had a PCN reaction causing immediate rash, facial/tongue/throat swelling, SOB or lightheadedness with hypotension: Yes Has patient had a PCN reaction causing severe rash involving mucus membranes or skin necrosis: No Has patient had a PCN reaction that required hospitalization No Has patient had a PCN reaction occurring within the last 10 years: No If all of the above answers are "NO", then may proceed with Cephalosporin use.  HAS TOLERATED: cephalexin   Sulfacetamide Other (See Comments)    Unknown reaction   Sulfonamide Derivatives Other (See Comments)    Unknown reaction   Crestor [Rosuvastatin] Itching, Swelling and Rash   Erythromycin Rash   Firvanq [Vancomycin] Itching   Statins Itching, Swelling and Rash    Rash on his trunk and arms, swelling in his lips    Level of Care/Admitting Diagnosis ED Disposition     ED Disposition  Admit   Condition  --   Comment  Hospital Area: [facility] [[id number]]  Level of Care: Telemetry Cardiac [103]  May place patient in observation at [facility] or [facility] if equivalent level of care is available:: No  Covid Evaluation: Asymptomatic - no recent exposure (last 10 days) testing not required  Diagnosis: New onset of congestive heart failure [facility]) [[id number]]  Admitting Physician: Hillary Bow [phone]  Attending Physician: Hillary Bow [phone]          B Medical/Surgery History Past Medical History:  Diagnosis Date   Acute ST elevation myocardial infarction (STEMI) of inferior wall (HCC) 03/01/2020   [facility] [place]   Anxiety    Arthritis    Back (05/04/2018)   Carotid artery disease (HCC)    a. 04/2018 s/p R carotid stenting. b. L TCAR 07/2022.   Chronic lower back pain    "have it at night" (05/04/2018)   CKD (chronic kidney disease), stage III (HCC)    Coronary artery disease    CVA (cerebral vascular accident) (HCC)    Depression    GERD (gastroesophageal reflux disease)    High triglycerides    History of kidney stones    Hyperlipidemia LDL goal <70    Hypertension    Hypertensive crisis 08/08/2022   Laryngeal carcinoma (HCC) 1998   PAF (paroxysmal atrial fibrillation) (HCC)    Peripheral vascular disease (HCC)    Pneumonia 11/11/2021   RBBB    Sinus bradycardia    Stroke [facility])    "he's had several little strokes; many that he wasn't  aware of" (05/04/2018)   TIA (transient ischemic attack)    Tobacco abuse    Tracheal stenosis    Tracheostomy in place [facility])    Past Surgical History:  Procedure Laterality Date   CAROTID STENT Right 06-13-12; 05/04/2018   CAROTID STENT INSERTION N/A 06/13/2012   Procedure: CAROTID STENT INSERTION;  Surgeon: Nada Libman, MD;  Location: [facility] CATH LAB;  Service: Cardiovascular;  Laterality: N/A;   CATARACT EXTRACTION, BILATERAL Bilateral    COLONOSCOPY  11/10/2011   Dr. Jena Gauss: hemorrhoids, tubular adenoma   CORNEAL TRANSPLANT Bilateral     CORONARY ARTERY BYPASS GRAFT  ~ 2003   "CABG X3"   CORONARY BALLOON ANGIOPLASTY N/A 11/26/2020   Procedure: CORONARY BALLOON ANGIOPLASTY;  Surgeon: Lennette Bihari, MD;  Location: [facility] INVASIVE CV LAB;  Service: Cardiovascular;  Laterality: N/A;   CORONARY STENT INTERVENTION N/A 06/05/2020   Procedure: CORONARY STENT INTERVENTION;  Surgeon: Runell Gess, MD;  Location: [facility] INVASIVE CV LAB;  Service: Cardiovascular;  Laterality: N/A;   EYE SURGERY     INSERTION OF RETROGRADE CAROTID STENT Right 05/04/2018   Procedure: INSERTION OF RIGHT  CAROTID STENT using an ABBOT- XACT carotid stent system;  Surgeon: Nada Libman, MD;  Location: [facility] OR;  Service: Vascular;  Laterality: Right;   LAPAROSCOPIC CHOLECYSTECTOMY  2007   LEFT HEART CATH AND CORS/GRAFTS ANGIOGRAPHY N/A 10/07/2016   Procedure: Left Heart Cath and Cors/Grafts Angiography;  Surgeon: Lennette Bihari, MD;  Location: [facility] INVASIVE CV LAB;  Service: Cardiovascular;  Laterality: N/A;   LEFT HEART CATH AND CORS/GRAFTS ANGIOGRAPHY N/A 06/05/2020   Procedure: LEFT HEART CATH AND CORS/GRAFTS ANGIOGRAPHY;  Surgeon: Runell Gess, MD;  [facility]: [facility] INVASIVE CV LAB;  Service: Cardiovascular;  Laterality: N/A;   LEFT HEART CATH AND CORS/GRAFTS ANGIOGRAPHY N/A 11/26/2020   Procedure: LEFT HEART CATH AND CORS/GRAFTS ANGIOGRAPHY;  Surgeon: Lennette Bihari, MD;  Location: [facility] INVASIVE CV LAB;  Service: Cardiovascular;  Laterality: N/A;   PARTIAL LARYNGECTOMY  1998   TRACHEOSTOMY  03/13/2018   "@ [facility]"   TRANSCAROTID ARTERY REVASCULARIZATION  Left 08/17/2022   Procedure: Transcarotid Artery Revascularization;  Surgeon: Victorino Sparrow, MD;  Location: [facility] OR;  Service: Vascular;  Laterality: Left;   TRANSURETHRAL RESECTION OF BLADDER TUMOR N/A 03/02/2023   Procedure: TRANSURETHRAL RESECTION OF BLADDER TUMOR (TURBT)- with gemcitabine instillation;  Surgeon: [name], MD;  Location: [facility] ORS;  Service: Urology;  Laterality: N/A;     A IV  Location/Drains/Wounds Patient Lines/Drains/Airways Status     Active Line/Drains/Airways     Name Placement date Placement time Site Days   Peripheral IV 04/16/2023 20 G Right Antecubital 04/17/2023  2106  Antecubital  1   Tracheostomy Shiley Flexible 6 mm Uncuffed 08/15/22  --  6 mm  250   Tracheostomy Shiley Legacy 6 mm Uncuffed --  --  6 mm  --            Intake/Output Last 24 hours No intake or output data in the 24 hours ending 04/22/23 0056  Labs/Imaging Results for orders placed or performed during the hospital encounter of 04/09/2023 (from the past 48 hour(s))  Brain natriuretic peptide     Status: Abnormal   Collection Time: 03/31/2023  8:09 PM  Result Value Ref Range   B Natriuretic Peptide 1,131.7 (H) 0.0 - 100.0 pg/mL    Comment: Performed at [facility] Lab, [address] [place], [place] [address]  Basic metabolic panel  Status: Abnormal   Collection Time: 05/13/2023  8:09 PM  Result Value Ref Range   Sodium 134 (L) 135 - 145 mmol/L   Potassium 3.7 3.5 - 5.1 mmol/L   Chloride 97 (L) 98 - 111 mmol/L   CO2 24 22 - 32 mmol/L   Glucose, Bld 163 (H) 70 - 99 mg/dL    Comment: Glucose reference range applies only to samples taken after fasting for at least 8 hours.   BUN 40 (H) 8 - 23 mg/dL   Creatinine, Ser 8.29 (H) 0.61 - 1.24 mg/dL   Calcium 8.6 (L) 8.9 - 10.3 mg/dL   GFR, Estimated 28 (L) >60 mL/min    Comment: (NOTE) Calculated using the CKD-EPI Creatinine Equation (2021)    Anion gap 13 5 - 15    Comment: Performed at [facility] Lab, [address] [place], [place] [address]  CBC     Status: Abnormal   Collection Time: 13-May-2023  8:09 PM  Result Value Ref Range   WBC 9.8 4.0 - 10.5 K/uL   RBC 3.34 (L) 4.22 - 5.81 MIL/uL   Hemoglobin 9.0 (L) 13.0 - 17.0 g/dL   HCT 08.6 (L) 57.8 - 46.9 %   MCV 89.2 80.0 - 100.0 fL   MCH 26.9 26.0 - 34.0 pg   MCHC 30.2 30.0 - 36.0 g/dL   RDW 62.9 (H) 52.8 - 41.3 %   Platelets 315 150 - 400 K/uL   nRBC 0.0 0.0 - 0.2  %    Comment: Performed at [facility] Lab, [address], [place], [place] [address]  Troponin I (High Sensitivity)     Status: Abnormal   Collection Time: 05-13-23  8:09 PM  Result Value Ref Range   Troponin I (High Sensitivity) 297 (HH) <18 ng/L    Comment: CRITICAL RESULT CALLED TO, READ BACK BY AND VERIFIED WITH OLDBLAND, B. RN @ 2105 05-13-2023 JBUTLER (NOTE) Elevated high sensitivity troponin I (hsTnI) values and significant  changes across serial measurements may suggest ACS but many other  chronic and acute conditions are known to elevate hsTnI results.  Refer to the "Links" section for chest pain algorithms and additional  guidance. Performed at [facility] Lab, [address], [place], [place] [address]   Troponin I (High Sensitivity)     Status: Abnormal   Collection Time: 2023/05/13 10:57 PM  Result Value Ref Range   Troponin I (High Sensitivity) 295 (HH) <18 ng/L    Comment: CRITICAL VALUE NOTED. VALUE IS CONSISTENT WITH PREVIOUSLY REPORTED/CALLED VALUE (NOTE) Elevated high sensitivity troponin I (hsTnI) values and significant  changes across serial measurements may suggest ACS but many other  chronic and acute conditions are known to elevate hsTnI results.  Refer to the "Links" section for chest pain algorithms and additional  guidance. Performed at [facility] Lab, [address] [place], [place] [address]   Urinalysis, w/ Reflex to Culture (Infection Suspected) -Urine, Clean Catch     Status: Abnormal   Collection Time: 13-May-2023 10:57 PM  Result Value Ref Range   Specimen Source URINE, CLEAN CATCH    Color, Urine YELLOW YELLOW   APPearance HAZY (A) CLEAR   Specific Gravity, Urine 1.017 1.005 - 1.030   pH 5.0 5.0 - 8.0   Glucose, UA NEGATIVE NEGATIVE mg/dL   Hgb urine dipstick LARGE (A) NEGATIVE   Bilirubin Urine NEGATIVE NEGATIVE   Ketones, ur NEGATIVE NEGATIVE mg/dL   Protein, ur 30 (A) NEGATIVE mg/dL  Nitrite NEGATIVE NEGATIVE   Leukocytes,Ua  LARGE (A) NEGATIVE   RBC / HPF >50 0 - 5 RBC/hpf   WBC, UA >50 0 - 5 WBC/hpf    Comment:        Reflex urine culture not performed if WBC <=10, OR if Squamous epithelial cells >5. If Squamous epithelial cells >5 suggest recollection.    Bacteria, UA NONE SEEN NONE SEEN   Squamous Epithelial / HPF 0-5 0 - 5 /HPF   WBC Clumps PRESENT    Mucus PRESENT     Comment: Performed at [facility] Lab, [address] [place], [place] [address]   DG Chest Portable 1 View  Result Date: 04/18/2023 CLINICAL DATA:  Shortness of breath. EXAM: PORTABLE CHEST 1 VIEW COMPARISON:  03/03/2023. FINDINGS: The heart is enlarged and the mediastinal contour is stable. A tracheostomy tube terminates 3.5 cm above the carina. There is evidence of prior cardiothoracic surgery. Mildly increased interstitial prominence with scattered airspace opacities at the lung bases. No effusion or pneumothorax. A vascular stent is noted in the superior mediastinum on the right. IMPRESSION: Increased interstitial prominence bilaterally with airspace opacities at the lung bases. Electronically Signed   By: Thornell Sartorius M.D.   On: 04/25/2023 22:53    Pending Labs Unresulted Labs (From admission, onward)     Start     Ordered   04/22/23 0500  CBC  Tomorrow morning,   R        04/22/23 0044   04/22/23 0500  Basic metabolic panel  Tomorrow morning,   R        04/22/23 0044   04/22/23 0050  Urine Culture (for pregnant, neutropenic or urologic patients or patients with an indwelling urinary catheter)  (Urine Labs)  Add-on,   AD       Question:  Indication  Answer:  Dysuria   04/22/23 0049   04/24/2023 2257  Urine Culture  Once,   R        04/05/2023 2257            Vitals/Pain Today's Vitals   04/09/2023 2355 04/01/2023 2357 04/22/23 0000 04/22/23 0030  BP: 111/70  110/81 113/87  Pulse: 86  (!) 109 (!) 107  Resp: (!) 24  (!) 22 (!) 22  Temp:  97.6 F (36.4 C) 97.7 F (36.5 C)   TempSrc:  Oral    SpO2: 100%  100% 100%   Weight:      Height:      PainSc:        Isolation Precautions No active isolations  Medications Medications  aspirin EC tablet 325 mg (325 mg Oral Given 04/02/2023 2128)  morphine (PF) 2 MG/ML injection 2-4 mg (has no administration in time range)  furosemide (LASIX) injection 40 mg (has no administration in time range)  acetaminophen (TYLENOL) tablet 650 mg (has no administration in time range)    Or  acetaminophen (TYLENOL) suppository 650 mg (has no administration in time range)  ondansetron (ZOFRAN) tablet 4 mg (has no administration in time range)    Or  ondansetron (ZOFRAN) injection 4 mg (has no administration in time range)  acetaminophen (TYLENOL) tablet 650 mg (650 mg Oral Given 04/02/2023 2111)  furosemide (LASIX) injection 40 mg (40 mg Intravenous Given 04/07/2023 2143)  morphine (PF) 4 MG/ML injection 4 mg (4 mg Intravenous Given 04/16/2023 2306)    Mobility non-ambulatory     Focused Assessments Cardiac Assessment Handoff:  Cardiac Rhythm: Atrial fibrillation Lab Results  Component Value Date   CKTOTAL 57 08/13/2011   CKMB 1.8 08/13/2011   TROPONINI <0.03 10/13/2018   Lab Results  Component Value Date   DDIMER 0.39 06/10/2018   Does the Patient currently have chest pain? No   , Neuro Assessment Handoff:  Swallow screen pass? Yes  Cardiac Rhythm: Atrial fibrillation       Neuro Assessment:   Neuro Checks:      Has TPA been given? No If patient is a Neuro Trauma and patient is going to OR before floor call report to 4N Charge nurse: [phone] or [phone]  , Pulmonary Assessment Handoff:  Lung sounds: Bilateral Breath Sounds: Rhonchi O2 Device: Tracheostomy Collar O2 Flow Rate (L/min): 8 L/min    R Recommendations: See Admitting Provider Note  Report given to:   Additional Notes:

## 2023-04-22 NOTE — Significant Event (Signed)
Rapid Response Event Note   Reason for Call :  SOB  Initial Focused Assessment:  Pt sitting up in bed with eyes open. His breathing is tachypneic and labored. Lungs with crackles t/o. ABD soft to touch. Skin pale,  warm, and dry.   T-98.1, HR-132, BP-153/105, RR-35, SpO2-100% on 100% TC  Pt received 40mg  lasix IV at 1803 with 50cc UOP since then. Xopenex given at 1827.  Interventions:  PCXR-Cardiomegaly with diffuse bilateral airspace disease, likely CHF. Small left pleural effusion. EKG-Afib RVR Albuterol tx Lasix 40mg  IV x 1  1mg  morphine IV x 1 Bladder scan-0cc  PCCM consulted: Dr. Craige Cotta to bedside.  Comfort care Additional 1mg  morphine IV, Morphine gtt.  Plan of Care:  Pt made comfort care. Morphine gtt order placed. 1mg  morphine to be given while awaiting gtt.  Family at bedside, support given and questions answered.  Please call RRT if further assistance needed.   Event Summary:   MD Notified: Dr. Loney Loh notified and came to bedside. Call Time:1939 Arrival [id number] End [id number]  Terrilyn Saver, RN

## 2023-04-23 DIAGNOSIS — I509 Heart failure, unspecified: Secondary | ICD-10-CM | POA: Diagnosis not present

## 2023-04-23 LAB — URINE CULTURE: Culture: NO GROWTH

## 2023-04-23 MED ORDER — DIAZEPAM 5 MG/ML IJ SOLN: 2.5000 mg | INTRAMUSCULAR | Status: DC | PRN

## 2023-04-25 ENCOUNTER — Ambulatory Visit: Payer: Medicare HMO | Admitting: Urology

## 2023-04-25 DIAGNOSIS — R310 Gross hematuria: Secondary | ICD-10-CM

## 2023-04-25 DIAGNOSIS — C670 Malignant neoplasm of trigone of bladder: Secondary | ICD-10-CM

## 2023-04-26 NOTE — Progress Notes (Signed)
Approx1935 Pt observed to be in respiratory distress with tachypnea, unreliable spo2 levels, use of accessory muscles, and agitation at time of assessment Approx 1940 Rapid response was notified by offgoing nurse and report was given concerning the patient status MD Loney Loh was notified by offgoing RN Marylene Land of patient's worsening condition while I was at the bedside Approx 2000New orders were given upon arrival of MD Rathore while caring for patient's needs EKG Performed CXR performed Lasix was also ordered Morphine was ordered to be given and was given at 2032hrs as well as additional dose approved by MD Loney Loh once family spoke with MD Craige Cotta about options for the patient and the decision to place patient on comfort measures Approx 2115 Pt was placed on morphine drip as per orders for comfort measures and will continue to be monitored Family members are at the bedside

## 2023-04-26 NOTE — Progress Notes (Signed)
Pt family remains at the bedside Pt tachypnea has subsided, RR at approx 13-14 at this time Morphine drip still infusing as per orders  Will continue to monitor

## 2023-04-26 NOTE — Progress Notes (Signed)
Patient passed at this time. Surrounded by family at bedside. MD informed.

## 2023-04-26 NOTE — Progress Notes (Signed)
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 -Hospitalized in 8/24 with hematuria, bladder CA, underwent TURBT and intra bladder chemotherapy, continues to have persistent hematuria, taken off Eliquis on account of this -Recently hospitalized at [facility] in [place] for worsening shortness of breath, treated for pleural effusions and pneumonia, underwent thoracentesis, and discharged, -In the ED he was noted to be hypoxic, A-fib RVR, placed on 8 L nasal cannula, BNP 1131, troponin 297, creatinine 2.2, chest x-ray with increased interstitial prominence -Admitted 9/27, started on diuretics, worsening hypoxia and respiratory distress overnight 9/27, seen by critical care, transition to comfort care, echo earlier yesterday evening noted EF of 35% with moderate to severe mitral regurg  Subjective: -Resting comfortably on morphine drip  Assessment and Plan:  Acute systolic CHF, new diagnosis Acute hypoxic respiratory failure A-fib RVR Moderate to severe mitral regurgitation -Now comfort care, anticipate hospital demise  Hematuria, abnormal urinalysis Bladder CA, recent TURBT -Bladder scan negative for retention yesterday  Chronic kidney disease, stage 3a (HCC)  CAD S/P percutaneous coronary angioplasty  Chronic hypoxic respiratory failure (HCC) Trach dependent for many years, baseline O2 requirement is more recent and new as of discharge from Aug admission. 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  Essential hypertension Cont coreg.  DVT prophylaxis: SCDs Code Status: DNR Family Communication: Discussed with son at bedside Disposition Plan: Anticipate hospital death  Consultants:    Procedures:   Antimicrobials:    Objective: Vitals:   04/22/23 1933 04/22/23 1944 05-13-23 0100 May 13, 2023 0759  BP:  (!) 153/105    Pulse: (!) 102 85 93 (!) 111  Resp: (!) 35 (!) 35  11  Temp: 98.1 F (36.7 C)     TempSrc: Oral     SpO2: (!) 84% 100%  93%  Weight:      Height:        Intake/Output Summary (Last 24 hours) at 13-May-2023 1108 Last data filed at 04/22/2023 1837 Gross per 24 hour  Intake 32.84 ml  Output 50 ml  Net -17.16 ml   Filed Weights   04/25/2023 2003 04/22/23 0425  Weight: 83 kg 83 kg    Examination:  General exam: Chronically ill elderly male sitting up in bed, AAO x 2 HEENT: Positive JVD, tracheostomy with trach collar CVS: S1-S2, irregular rhythm Lungs: Few basilar rales, poor air movement bilaterally Abdomen: Soft, nontender, bowel sounds present Extremities: 1+ edema Psychiatry: Flat affect    Data Reviewed:   CBC: Recent Labs  Lab 04/10/2023 2009 04/22/23 0214  WBC 9.8 9.8  HGB 9.0* 9.3*  HCT 29.8* 29.9*  MCV 89.2 89.3  PLT 315 292   Basic Metabolic Panel: Recent Labs  Lab 03/27/2023 2009 04/22/23 0214  NA 134* 134*  K 3.7 3.6  CL 97* 96*  CO2 24 25  GLUCOSE 163* 119*  BUN 40* 39*  CREATININE 2.26* 2.21*  CALCIUM 8.6*  8.4*   GFR: Estimated Creatinine Clearance: 25.2 mL/min (A) (by C-G formula based on SCr of 2.21 mg/dL (H)). Liver Function Tests: No results for input(s): "AST", "ALT", "ALKPHOS", "BILITOT", "PROT", "ALBUMIN" in the last 168 hours. No results for input(s): "LIPASE", "AMYLASE" in the last 168 hours. No results for  input(s): "AMMONIA" in the last 168 hours. Coagulation Profile: No results for input(s): "INR", "PROTIME" in the last 168 hours. Cardiac Enzymes: No results for input(s): "CKTOTAL", "CKMB", "CKMBINDEX", "TROPONINI" in the last 168 hours. BNP (last 3 results) No results for input(s): "PROBNP" in the last 8760 hours. HbA1C: No results for input(s): "HGBA1C" in the last 72 hours. CBG: No results for input(s): "GLUCAP" in the last 168 hours. Lipid Profile: No results for input(s): "CHOL", "HDL", "LDLCALC", "TRIG", "CHOLHDL", "LDLDIRECT" in the last 72 hours. Thyroid Function Tests: No results for input(s): "TSH", "T4TOTAL", "FREET4", "T3FREE", "THYROIDAB" in the last 72 hours. Anemia Panel: No results for input(s): "VITAMINB12", "FOLATE", "FERRITIN", "TIBC", "IRON", "RETICCTPCT" in the last 72 hours. Urine analysis:    Component Value Date/Time   COLORURINE YELLOW 04/20/2023 2257   APPEARANCEUR HAZY (A) 04/04/2023 2257   APPEARANCEUR Cloudy (A) 03/29/2023 1014   LABSPEC 1.017 04/10/2023 2257   PHURINE 5.0 04/12/2023 2257   GLUCOSEU NEGATIVE 03/29/2023 2257   GLUCOSEU neg 06/01/2010 1113   HGBUR LARGE (A) 04/09/2023 2257   BILIRUBINUR NEGATIVE 04/07/2023 2257   BILIRUBINUR Negative 03/29/2023 1014   KETONESUR NEGATIVE 04/14/2023 2257   PROTEINUR 30 (A) 03/31/2023 2257   NITRITE NEGATIVE 04/11/2023 2257   LEUKOCYTESUR LARGE (A) 04/05/2023 2257   Sepsis Labs: @LABRCNTIP (procalcitonin:4,lacticidven:4)  ) Recent Results (from the past 240 hour(s))  Urine Culture     Status: None   Collection Time: 04/12/2023 10:57 PM   Specimen: Urine, Random  Result Value Ref Range Status   Specimen Description URINE, RANDOM  Final   Special Requests NONE Reflexed from [id number]  Final   Culture   Final    NO GROWTH Performed at [facility], [address] [place], [place] [address]    Report Status 2023/05/12 FINAL  Final     Radiology Studies: DG Chest Port 1 View  Result Date:  04/22/2023 CLINICAL DATA:  New onset CHF.  Shortness of breath. EXAM: PORTABLE CHEST 1 VIEW COMPARISON:  04/10/2023 FINDINGS: Cardiomegaly. Diffuse bilateral airspace disease most likely edema/CHF. Small left pleural effusion. Prior median sternotomy/CABG. No acute bony abnormality. IMPRESSION: Cardiomegaly with diffuse bilateral airspace disease, likely CHF. Small left pleural effusion. Electronically Signed   By: Charlett Nose M.D.   On: 04/22/2023 20:16   ECHOCARDIOGRAM COMPLETE  Result Date: 04/22/2023    ECHOCARDIOGRAM REPORT   Patient Name:   Kenneth [name] Date of Exam: 04/22/2023 Medical Rec #:  [id number]        Height:       66.0 in Accession #:    [id number]       Weight:       183.0 lb Date of Birth:  12-Oct-1940        BSA:          1.926 m Patient Age:    81 years         BP:           91/58 mmHg Patient Gender: M                HR:           103 bpm. Exam Location:  Inpatient  Procedure: 2D Echo, Cardiac Doppler and Color Doppler Indications:    CHF                 Atrial fibrillation  History:        Patient has prior history of Echocardiogram examinations, most                 recent 08/09/2022. CAD and Previous Myocardial Infarction, TIA,                 Stroke and Carotid Disease, Arrythmias:RBBB and Atrial                 Fibrillation; Risk Factors:Former Smoker, Hypertension and                 Dyslipidemia. Neck stoma, CKD, CA.  Sonographer:    Milda Smart Referring Phys: [id number] JARED M GARDNER  Sonographer Comments: Echo performed with patient supine and on artificial respirator. Image acquisition challenging due to respiratory motion. IMPRESSIONS  1. Left ventricular ejection fraction, by estimation, is 35 to 40%. The left ventricle has moderately decreased function. The left ventricle demonstrates global hypokinesis. Left ventricular diastolic function could not be evaluated.  2. Right ventricular systolic function is normal. The right ventricular size is normal. There is normal pulmonary  artery systolic pressure.  3. The mitral valve is normal in structure. Moderate to severe mitral valve regurgitation. No evidence of mitral stenosis.  4. The aortic valve is tricuspid. There is moderate calcification of the aortic valve. There is moderate thickening of the aortic valve. Aortic valve regurgitation is mild. No aortic stenosis is present.  5. The inferior vena cava is dilated in size with <50% respiratory variability, suggesting right atrial pressure of 15 mmHg. FINDINGS  Left Ventricle: Left ventricular ejection fraction, by estimation, is 35 to 40%. The left ventricle has moderately decreased function. The left ventricle demonstrates global hypokinesis. The left ventricular internal cavity size was normal in size. There is no left ventricular hypertrophy. Left ventricular diastolic function could not be evaluated due to atrial fibrillation. Left ventricular diastolic function could not be evaluated. Right Ventricle: The right ventricular size is normal. No increase in right ventricular wall thickness. Right ventricular systolic function is normal. There is normal pulmonary artery systolic pressure. The tricuspid regurgitant velocity is 1.55 m/s, and  with an assumed right atrial pressure of 15 mmHg, the estimated right ventricular systolic pressure is 24.6 mmHg. Left Atrium: Left atrial size was normal in size. Right Atrium: Right atrial size was normal in size. Pericardium: There is no evidence of pericardial effusion. Mitral Valve: The mitral valve is normal in structure. Mild mitral annular calcification. Moderate to severe mitral valve regurgitation. No evidence of mitral valve stenosis. Tricuspid Valve: The tricuspid valve is normal in structure. Tricuspid valve regurgitation is not demonstrated. No evidence of tricuspid stenosis. Aortic Valve: The aortic valve is tricuspid. There is moderate calcification of the aortic valve. There is moderate thickening of the aortic valve. Aortic valve  regurgitation is mild. No aortic stenosis is present. Pulmonic Valve: The pulmonic valve was normal in structure. Pulmonic valve regurgitation is not visualized. No evidence of pulmonic stenosis. Aorta: The aortic root is normal in size and structure. Venous: The inferior vena cava is dilated in size with less than 50% respiratory variability, suggesting right atrial pressure of 15 mmHg. IAS/Shunts: No atrial level shunt detected by color flow Doppler.  LEFT VENTRICLE PLAX 2D LVIDd:         4.80  cm LVIDs:         4.00 cm LV PW:         1.00 cm LV IVS:        1.00 cm LVOT diam:     2.00 cm LVOT Area:     3.14 cm  RIGHT VENTRICLE          IVC RV Basal diam:  3.00 cm  IVC diam: 2.20 cm LEFT ATRIUM             Index        RIGHT ATRIUM          Index LA diam:        4.60 cm 2.39 cm/m   RA Area:     9.93 cm LA Vol (A2C):   49.1 ml 25.47 ml/m  RA Volume:   16.40 ml 8.52 ml/m LA Vol (A4C):   38.7 ml 20.10 ml/m LA Biplane Vol: 42.8 ml 22.23 ml/m   AORTA Ao Root diam: 3.10 cm Ao Asc diam:  3.10 cm MR Peak grad: 50.7 mmHg   TRICUSPID VALVE MR Mean grad: 33.0 mmHg   TR Peak grad:   9.6 mmHg MR Vmax:      356.00 cm/s TR Vmax:        155.00 cm/s MR Vmean:     263.0 cm/s                           SHUNTS                           Systemic Diam: 2.00 cm Chilton Si MD Electronically signed by Chilton Si MD Signature Date/Time: 04/22/2023/4:39:59 PM    Final    DG Chest Portable 1 View  Result Date: 03/31/2023 CLINICAL DATA:  Shortness of breath. EXAM: PORTABLE CHEST 1 VIEW COMPARISON:  03/03/2023. FINDINGS: The heart is enlarged and the mediastinal contour is stable. A tracheostomy tube terminates 3.5 cm above the carina. There is evidence of prior cardiothoracic surgery. Mildly increased interstitial prominence with scattered airspace opacities at the lung bases. No effusion or pneumothorax. A vascular stent is noted in the superior mediastinum on the right. IMPRESSION: Increased interstitial prominence  bilaterally with airspace opacities at the lung bases. Electronically Signed   By: Thornell Sartorius M.D.   On: 04/15/2023 22:53     Scheduled Meds:   Continuous Infusions:  sodium chloride 20 mL/hr at 04/22/23 2106   morphine 10 mg/hr (05-19-2023 0850)     LOS: 1 [name]    Time spent:    Zannie Cove, MD [facility]   05/19/2023, 11:08 AM

## 2023-04-26 NOTE — Progress Notes (Signed)
Pt given bolus of morphine as per orders for comfort measures Given 5mg  morphine bolus at approx 2349hrs and again at approx 0145hrs Rate increase on morphine drip as per titration orders at this time Pt still having some dyspnea and labored breathing with tachypnea RR at 22-24

## 2023-04-26 NOTE — Progress Notes (Signed)
Morphine bag nearly empty during bedside report. Message sent to pharmacy to send another bag.

## 2023-04-26 NOTE — Progress Notes (Signed)
Morphine wasted and witnessed in pyxis room with Darl Pikes, Charity fundraiser. 50cc of morphine wasted.

## 2023-04-26 DEATH — deceased

## 2023-04-28 ENCOUNTER — Ambulatory Visit: Payer: PRIVATE HEALTH INSURANCE | Admitting: Student

## 2023-05-09 ENCOUNTER — Ambulatory Visit: Payer: Medicare HMO | Admitting: Internal Medicine

## 2023-05-11 ENCOUNTER — Telehealth: Payer: Self-pay

## 2023-05-11 ENCOUNTER — Encounter: Payer: Self-pay | Admitting: Urology

## 2023-05-11 NOTE — Telephone Encounter (Signed)
Medical policy denial received via [facility] for urine culture - incorrect diagnosis submitted. Form resubmitted. In media

## 2023-05-27 NOTE — Discharge Summary (Signed)
Death Summary  Kenneth [name]:[id number] DOB: [birth year] DOA: May 05, 2023  PCP: [name], MD  Admit date: 2023/05/05 Date of Death: 2023/05/07  Final Diagnoses:  Principal Problem: Acute systolic CHF, new diagnosis Severe mitral regurgitation   CAD S/P percutaneous coronary angioplasty Multiple strokes   Chronic kidney disease, stage 3a (HCC)   Atrial fibrillation with RVR (HCC)   UTI (urinary tract infection)   Essential hypertension Laryngeal cancer Interstitial lung disease Bladder cancer   Tracheostomy dependent (HCC)   Chronic hypoxic respiratory failure (HCC)   CHF (congestive heart failure) (HCC)   Acute pulmonary edema (HCC)   Acute on chronic respiratory failure with hypoxia (HCC)  History of present illness:  81/M with history of CAD, CABG 2003, type 2 diabetes mellitus, hypertension, multiple strokes, right carotid artery stent, CKD 3A, laryngeal cancer sp surgery, tracheal stenosis with chronic trach, 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 -Hospitalized in 8/24 with hematuria, bladder CA, underwent TURBT and intra bladder chemotherapy, continues to have persistent hematuria, taken off Eliquis on account of this -Recently hospitalized at [facility] in [place] for worsening shortness of breath, treated for pleural effusions and pneumonia, underwent thoracentesis, and discharged, -In the ED he was noted to be hypoxic, A-fib RVR, placed on 8 L nasal cannula, BNP 1131, troponin 297, creatinine 2.2, chest x-ray with increased interstitial prominence -Admitted 9/27, started on diuretics, worsening hypoxia and respiratory distress overnight 9/27, seen by critical care, felt to be a poor candidate for aggressive care, transitioned to comfort care, echo earlier yesterday evening noted EF of 35% with moderate to severe mitral regurg    Hospital Course:   Acute systolic CHF, new  diagnosis Acute hypoxic respiratory failure A-fib RVR Moderate to severe mitral regurgitation -He was admitted on 9/27, had worsening hypoxia and respiratory distress overnight, seen by critical care team, transition to comfort focused care after discussion with family -expired on 05/07/23   Hematuria, abnormal urinalysis Bladder CA, recent TURBT   Chronic kidney disease, stage 3a (HCC)   CAD S/P percutaneous coronary angioplasty   Chronic hypoxic respiratory failure (HCC) Trach dependent for many years, baseline O2 requirement is more recent and new as of discharge from Aug admission. -Likely multifactorial, combination of CHF, COPD and mild ILD   H/o Multiple CVAs -History of critical ICA stenosis and left TCAR in 1/24 -Continue aspirin, no longer on Plavix and Eliquis likely secondary to   Tracheostomy dependent (HCC) Chronic trach dependence for many years now due to tracheal stenosis from prior treatment for laryngeal CA.   Essential hypertension Cont coreg.   Time:  Signed:  Zannie Cove  [facility] 05/17/2023, 8:36 AM
